# Patient Record
Sex: Male | Born: 1940 | Race: Black or African American | Hispanic: No | Marital: Married | Smoking: Former smoker
Health system: Southern US, Community
[De-identification: ages and names within clinical notes are randomized; demographics above are authoritative.]

## PROBLEM LIST (undated history)

## (undated) DIAGNOSIS — I639 Cerebral infarction, unspecified: Secondary | ICD-10-CM

## (undated) DIAGNOSIS — E041 Nontoxic single thyroid nodule: Secondary | ICD-10-CM

## (undated) DIAGNOSIS — D649 Anemia, unspecified: Secondary | ICD-10-CM

## (undated) DIAGNOSIS — I10 Essential (primary) hypertension: Secondary | ICD-10-CM

## (undated) DIAGNOSIS — K5730 Diverticulosis of large intestine without perforation or abscess without bleeding: Secondary | ICD-10-CM

## (undated) HISTORY — DX: Diverticulosis of large intestine without perforation or abscess without bleeding: K57.30

## (undated) HISTORY — PX: CATARACT EXTRACTION, BILATERAL: SHX1313

## (undated) HISTORY — DX: Cerebral infarction, unspecified: I63.9

## (undated) HISTORY — DX: Essential (primary) hypertension: I10

## (undated) HISTORY — DX: Anemia, unspecified: D64.9

## (undated) HISTORY — PX: BACK SURGERY: SHX140

---

## 1998-04-16 ENCOUNTER — Ambulatory Visit (HOSPITAL_COMMUNITY): Admission: RE | Admit: 1998-04-16 | Discharge: 1998-04-16 | Payer: Self-pay | Admitting: Gastroenterology

## 2000-02-27 ENCOUNTER — Emergency Department (HOSPITAL_COMMUNITY): Admission: EM | Admit: 2000-02-27 | Discharge: 2000-02-27 | Payer: Self-pay | Admitting: Emergency Medicine

## 2000-03-04 ENCOUNTER — Encounter: Payer: Self-pay | Admitting: Pediatrics

## 2000-03-04 ENCOUNTER — Ambulatory Visit (HOSPITAL_COMMUNITY): Admission: RE | Admit: 2000-03-04 | Discharge: 2000-03-04 | Payer: Self-pay | Admitting: Pediatrics

## 2000-03-12 ENCOUNTER — Ambulatory Visit (HOSPITAL_COMMUNITY): Admission: RE | Admit: 2000-03-12 | Discharge: 2000-03-12 | Payer: Self-pay | Admitting: Pediatrics

## 2000-03-18 ENCOUNTER — Ambulatory Visit: Admission: RE | Admit: 2000-03-18 | Discharge: 2000-03-18 | Payer: Self-pay | Admitting: Pediatrics

## 2000-05-25 ENCOUNTER — Encounter (INDEPENDENT_AMBULATORY_CARE_PROVIDER_SITE_OTHER): Payer: Self-pay | Admitting: *Deleted

## 2000-05-25 ENCOUNTER — Ambulatory Visit (HOSPITAL_COMMUNITY): Admission: RE | Admit: 2000-05-25 | Discharge: 2000-05-25 | Payer: Self-pay | Admitting: *Deleted

## 2003-01-04 ENCOUNTER — Encounter: Payer: Self-pay | Admitting: Internal Medicine

## 2003-01-04 LAB — HM COLONOSCOPY

## 2004-08-15 ENCOUNTER — Ambulatory Visit: Payer: Self-pay | Admitting: Internal Medicine

## 2004-11-13 ENCOUNTER — Ambulatory Visit: Payer: Self-pay | Admitting: Internal Medicine

## 2005-02-20 ENCOUNTER — Ambulatory Visit: Payer: Self-pay | Admitting: Internal Medicine

## 2005-05-22 ENCOUNTER — Ambulatory Visit: Payer: Self-pay | Admitting: Internal Medicine

## 2005-08-24 ENCOUNTER — Ambulatory Visit: Payer: Self-pay | Admitting: Internal Medicine

## 2005-08-28 ENCOUNTER — Ambulatory Visit: Payer: Self-pay | Admitting: Internal Medicine

## 2005-08-31 ENCOUNTER — Ambulatory Visit: Payer: Self-pay | Admitting: Internal Medicine

## 2005-09-15 ENCOUNTER — Ambulatory Visit: Payer: Self-pay | Admitting: Internal Medicine

## 2005-10-05 ENCOUNTER — Ambulatory Visit: Payer: Self-pay | Admitting: Internal Medicine

## 2005-10-20 ENCOUNTER — Ambulatory Visit: Payer: Self-pay | Admitting: Internal Medicine

## 2005-12-01 ENCOUNTER — Ambulatory Visit: Payer: Self-pay | Admitting: Internal Medicine

## 2005-12-11 ENCOUNTER — Ambulatory Visit: Payer: Self-pay | Admitting: Internal Medicine

## 2006-02-02 ENCOUNTER — Ambulatory Visit: Payer: Self-pay | Admitting: Internal Medicine

## 2006-02-15 ENCOUNTER — Ambulatory Visit: Payer: Self-pay | Admitting: Internal Medicine

## 2006-04-02 ENCOUNTER — Ambulatory Visit: Payer: Self-pay | Admitting: Internal Medicine

## 2006-07-06 ENCOUNTER — Ambulatory Visit: Payer: Self-pay | Admitting: Internal Medicine

## 2006-07-16 ENCOUNTER — Ambulatory Visit: Payer: Self-pay | Admitting: Internal Medicine

## 2006-07-16 LAB — CONVERTED CEMR LAB
ALT: 18 units/L (ref 0–40)
AST: 22 units/L (ref 0–37)
Albumin: 3.9 g/dL (ref 3.5–5.2)
Alkaline Phosphatase: 132 units/L — ABNORMAL HIGH (ref 39–117)
BUN: 15 mg/dL (ref 6–23)
Bilirubin, Direct: 0.1 mg/dL (ref 0.0–0.3)
CO2: 28 meq/L (ref 19–32)
Calcium: 9.8 mg/dL (ref 8.4–10.5)
Chloride: 103 meq/L (ref 96–112)
Chol/HDL Ratio, serum: 2.5
Cholesterol: 128 mg/dL (ref 0–200)
Creatinine, Ser: 1.1 mg/dL (ref 0.4–1.5)
GFR calc non Af Amer: 71 mL/min
Glomerular Filtration Rate, Af Am: 86 mL/min/{1.73_m2}
Glucose, Bld: 171 mg/dL — ABNORMAL HIGH (ref 70–99)
HDL: 50.4 mg/dL (ref >39.0)
Hgb A1c MFr Bld: 8.8 % — ABNORMAL HIGH (ref 4.6–6.0)
LDL Cholesterol: 71 mg/dL (ref 0–99)
Potassium: 4.6 meq/L (ref 3.5–5.1)
Sodium: 140 meq/L (ref 135–145)
Total Bilirubin: 0.5 mg/dL (ref 0.3–1.2)
Total Protein: 7.4 g/dL (ref 6.0–8.3)
Triglyceride fasting, serum: 31 mg/dL (ref 0–149)
VLDL: 6 mg/dL (ref 0–40)

## 2006-11-12 ENCOUNTER — Ambulatory Visit: Payer: Self-pay | Admitting: Internal Medicine

## 2006-11-12 LAB — CONVERTED CEMR LAB
ALT: 21 units/L (ref 0–40)
AST: 24 units/L (ref 0–37)
Albumin: 3.5 g/dL (ref 3.5–5.2)
Alkaline Phosphatase: 118 units/L — ABNORMAL HIGH (ref 39–117)
BUN: 14 mg/dL (ref 6–23)
Bilirubin, Direct: 0.1 mg/dL (ref 0.0–0.3)
CO2: 30 meq/L (ref 19–32)
Calcium: 8.9 mg/dL (ref 8.4–10.5)
Chloride: 104 meq/L (ref 96–112)
Cholesterol: 113 mg/dL (ref 0–200)
Creatinine, Ser: 1 mg/dL (ref 0.4–1.5)
GFR calc Af Amer: 96 mL/min
GFR calc non Af Amer: 79 mL/min
Glucose, Bld: 179 mg/dL — ABNORMAL HIGH (ref 70–99)
HDL: 50.3 mg/dL (ref >39.0)
Hgb A1c MFr Bld: 8.6 % — ABNORMAL HIGH (ref 4.6–6.0)
LDL Cholesterol: 54 mg/dL (ref 0–99)
Potassium: 4 meq/L (ref 3.5–5.1)
Sodium: 139 meq/L (ref 135–145)
Total Bilirubin: 0.4 mg/dL (ref 0.3–1.2)
Total CHOL/HDL Ratio: 2.2
Total Protein: 7.1 g/dL (ref 6.0–8.3)
Triglycerides: 42 mg/dL (ref 0–149)
VLDL: 8 mg/dL (ref 0–40)

## 2007-02-04 ENCOUNTER — Ambulatory Visit: Payer: Self-pay | Admitting: Internal Medicine

## 2007-02-08 DIAGNOSIS — Z8679 Personal history of other diseases of the circulatory system: Secondary | ICD-10-CM | POA: Insufficient documentation

## 2007-02-08 DIAGNOSIS — D509 Iron deficiency anemia, unspecified: Secondary | ICD-10-CM | POA: Insufficient documentation

## 2007-02-08 DIAGNOSIS — K5730 Diverticulosis of large intestine without perforation or abscess without bleeding: Secondary | ICD-10-CM | POA: Insufficient documentation

## 2007-02-08 DIAGNOSIS — I10 Essential (primary) hypertension: Secondary | ICD-10-CM | POA: Insufficient documentation

## 2007-02-08 DIAGNOSIS — E538 Deficiency of other specified B group vitamins: Secondary | ICD-10-CM | POA: Insufficient documentation

## 2007-02-14 ENCOUNTER — Ambulatory Visit: Payer: Self-pay | Admitting: Internal Medicine

## 2007-03-04 ENCOUNTER — Telehealth (INDEPENDENT_AMBULATORY_CARE_PROVIDER_SITE_OTHER): Payer: Self-pay | Admitting: *Deleted

## 2007-04-01 ENCOUNTER — Ambulatory Visit: Payer: Self-pay | Admitting: Internal Medicine

## 2007-04-01 DIAGNOSIS — E785 Hyperlipidemia, unspecified: Secondary | ICD-10-CM | POA: Insufficient documentation

## 2007-04-01 LAB — CONVERTED CEMR LAB
Cholesterol, target level: 200 mg/dL
HDL goal, serum: 40 mg/dL
LDL Goal: 100 mg/dL

## 2007-04-04 ENCOUNTER — Encounter: Payer: Self-pay | Admitting: Internal Medicine

## 2007-04-04 LAB — CONVERTED CEMR LAB
ALT: 15 units/L (ref 0–53)
AST: 19 units/L (ref 0–37)
Albumin: 3.3 g/dL — ABNORMAL LOW (ref 3.5–5.2)
Alkaline Phosphatase: 115 units/L (ref 39–117)
BUN: 12 mg/dL (ref 6–23)
Bilirubin, Direct: 0.1 mg/dL (ref 0.0–0.3)
CO2: 29 meq/L (ref 19–32)
Calcium: 9.3 mg/dL (ref 8.4–10.5)
Chloride: 105 meq/L (ref 96–112)
Cholesterol: 123 mg/dL (ref 0–200)
Creatinine, Ser: 1.1 mg/dL (ref 0.4–1.5)
GFR calc Af Amer: 86 mL/min
GFR calc non Af Amer: 71 mL/min
Glucose, Bld: 146 mg/dL — ABNORMAL HIGH (ref 70–99)
HDL: 56.2 mg/dL (ref >39.0)
Hgb A1c MFr Bld: 8.4 % — ABNORMAL HIGH (ref 4.6–6.0)
LDL Cholesterol: 60 mg/dL (ref 0–99)
Potassium: 4 meq/L (ref 3.5–5.1)
Sodium: 140 meq/L (ref 135–145)
Total Bilirubin: 0.6 mg/dL (ref 0.3–1.2)
Total CHOL/HDL Ratio: 2.2
Total Protein: 7.3 g/dL (ref 6.0–8.3)
Triglycerides: 33 mg/dL (ref 0–149)
VLDL: 7 mg/dL (ref 0–40)

## 2007-04-08 ENCOUNTER — Ambulatory Visit: Payer: Self-pay | Admitting: Internal Medicine

## 2007-05-06 ENCOUNTER — Ambulatory Visit (HOSPITAL_COMMUNITY): Admission: RE | Admit: 2007-05-06 | Discharge: 2007-05-06 | Payer: Self-pay | Admitting: Orthopedic Surgery

## 2007-05-09 ENCOUNTER — Telehealth: Payer: Self-pay | Admitting: Internal Medicine

## 2007-06-10 ENCOUNTER — Ambulatory Visit: Payer: Self-pay | Admitting: Internal Medicine

## 2007-06-16 ENCOUNTER — Telehealth: Payer: Self-pay | Admitting: Internal Medicine

## 2007-07-01 ENCOUNTER — Ambulatory Visit: Payer: Self-pay | Admitting: Internal Medicine

## 2007-07-01 LAB — CONVERTED CEMR LAB
ALT: 19 units/L (ref 0–53)
AST: 22 units/L (ref 0–37)
Albumin: 3.9 g/dL (ref 3.5–5.2)
Alkaline Phosphatase: 116 units/L (ref 39–117)
BUN: 13 mg/dL (ref 6–23)
Bilirubin, Direct: 0.1 mg/dL (ref 0.0–0.3)
CO2: 30 meq/L (ref 19–32)
Calcium: 9.5 mg/dL (ref 8.4–10.5)
Chloride: 106 meq/L (ref 96–112)
Cholesterol: 139 mg/dL (ref 0–200)
Creatinine, Ser: 1 mg/dL (ref 0.4–1.5)
GFR calc Af Amer: 96 mL/min
GFR calc non Af Amer: 79 mL/min
Glucose, Bld: 135 mg/dL — ABNORMAL HIGH (ref 70–99)
HDL: 61.6 mg/dL (ref >39.0)
Hgb A1c MFr Bld: 7.5 % — ABNORMAL HIGH (ref 4.6–6.0)
LDL Cholesterol: 69 mg/dL (ref 0–99)
LDL Goal: 70 mg/dL
Potassium: 4.2 meq/L (ref 3.5–5.1)
Sodium: 140 meq/L (ref 135–145)
Total Bilirubin: 0.6 mg/dL (ref 0.3–1.2)
Total CHOL/HDL Ratio: 2.3
Total Protein: 7.3 g/dL (ref 6.0–8.3)
Triglycerides: 43 mg/dL (ref 0–149)
VLDL: 9 mg/dL (ref 0–40)

## 2007-07-29 ENCOUNTER — Telehealth (INDEPENDENT_AMBULATORY_CARE_PROVIDER_SITE_OTHER): Payer: Self-pay | Admitting: *Deleted

## 2007-07-29 ENCOUNTER — Ambulatory Visit: Payer: Self-pay | Admitting: Internal Medicine

## 2007-08-11 HISTORY — PX: SHOULDER SURGERY: SHX246

## 2007-09-06 ENCOUNTER — Ambulatory Visit: Payer: Self-pay | Admitting: Internal Medicine

## 2007-09-16 ENCOUNTER — Encounter: Payer: Self-pay | Admitting: Internal Medicine

## 2007-11-04 ENCOUNTER — Ambulatory Visit: Payer: Self-pay | Admitting: Internal Medicine

## 2007-11-07 LAB — CONVERTED CEMR LAB
ALT: 20 units/L (ref 0–53)
AST: 24 units/L (ref 0–37)
GFR calc Af Amer: 96 mL/min
GFR calc non Af Amer: 79 mL/min
Glucose, Bld: 147 mg/dL — ABNORMAL HIGH (ref 70–99)
Hgb A1c MFr Bld: 8 % — ABNORMAL HIGH (ref 4.6–6.0)
LDL Cholesterol: 65 mg/dL (ref 0–99)
Potassium: 4.6 meq/L (ref 3.5–5.1)
Sodium: 140 meq/L (ref 135–145)
Total CHOL/HDL Ratio: 2.2
VLDL: 7 mg/dL (ref 0–40)

## 2007-11-11 ENCOUNTER — Telehealth: Payer: Self-pay | Admitting: Internal Medicine

## 2007-11-14 ENCOUNTER — Encounter: Payer: Self-pay | Admitting: Internal Medicine

## 2007-11-17 ENCOUNTER — Ambulatory Visit: Payer: Self-pay | Admitting: Internal Medicine

## 2008-01-20 ENCOUNTER — Ambulatory Visit: Payer: Self-pay | Admitting: Internal Medicine

## 2008-02-24 ENCOUNTER — Ambulatory Visit: Payer: Self-pay | Admitting: Internal Medicine

## 2008-02-27 LAB — CONVERTED CEMR LAB
ALT: 22 units/L (ref 0–53)
AST: 30 units/L (ref 0–37)
CO2: 28 meq/L (ref 19–32)
Chloride: 104 meq/L (ref 96–112)
Glucose, Bld: 159 mg/dL — ABNORMAL HIGH (ref 70–99)
Hgb A1c MFr Bld: 8.3 % — ABNORMAL HIGH (ref 4.6–6.0)
Potassium: 4.4 meq/L (ref 3.5–5.1)
Sodium: 139 meq/L (ref 135–145)

## 2008-03-23 ENCOUNTER — Ambulatory Visit: Payer: Self-pay | Admitting: Internal Medicine

## 2008-05-17 ENCOUNTER — Ambulatory Visit: Payer: Self-pay | Admitting: Internal Medicine

## 2008-05-17 LAB — HM DIABETES FOOT EXAM

## 2008-05-18 LAB — CONVERTED CEMR LAB
AST: 19 units/L (ref 0–37)
CO2: 28 meq/L (ref 19–32)
Calcium: 9.2 mg/dL (ref 8.4–10.5)
Chloride: 103 meq/L (ref 96–112)
Creatinine, Ser: 1.1 mg/dL (ref 0.4–1.5)
Glucose, Bld: 122 mg/dL — ABNORMAL HIGH (ref 70–99)
HDL: 55.7 mg/dL (ref >39.0)
Sodium: 140 meq/L (ref 135–145)
Triglycerides: 39 mg/dL (ref 0–149)

## 2008-06-29 ENCOUNTER — Ambulatory Visit: Payer: Self-pay | Admitting: Internal Medicine

## 2008-09-17 ENCOUNTER — Ambulatory Visit: Payer: Self-pay | Admitting: Internal Medicine

## 2008-09-17 DIAGNOSIS — Z8614 Personal history of Methicillin resistant Staphylococcus aureus infection: Secondary | ICD-10-CM

## 2008-09-18 ENCOUNTER — Encounter: Payer: Self-pay | Admitting: Internal Medicine

## 2008-09-18 ENCOUNTER — Ambulatory Visit: Payer: Self-pay | Admitting: Internal Medicine

## 2008-09-28 ENCOUNTER — Encounter: Payer: Self-pay | Admitting: Internal Medicine

## 2008-11-16 ENCOUNTER — Ambulatory Visit: Payer: Self-pay | Admitting: Internal Medicine

## 2008-11-16 LAB — CONVERTED CEMR LAB
ALT: 13 units/L (ref 0–53)
Calcium: 9 mg/dL (ref 8.4–10.5)
GFR calc non Af Amer: 95.14 mL/min (ref >60)
Glucose, Bld: 108 mg/dL — ABNORMAL HIGH (ref 70–99)
HDL: 60.7 mg/dL (ref >39.00)
Hgb A1c MFr Bld: 7.7 % — ABNORMAL HIGH (ref 4.6–6.5)
Potassium: 4.7 meq/L (ref 3.5–5.1)
Sodium: 141 meq/L (ref 135–145)
Triglycerides: 48 mg/dL (ref 0.0–149.0)
VLDL: 9.6 mg/dL (ref 0.0–40.0)

## 2008-11-23 ENCOUNTER — Telehealth: Payer: Self-pay | Admitting: Internal Medicine

## 2009-02-22 ENCOUNTER — Ambulatory Visit: Payer: Self-pay | Admitting: Internal Medicine

## 2009-03-18 ENCOUNTER — Ambulatory Visit: Payer: Self-pay | Admitting: Family Medicine

## 2009-05-03 ENCOUNTER — Ambulatory Visit: Payer: Self-pay | Admitting: Family Medicine

## 2009-05-04 ENCOUNTER — Encounter: Payer: Self-pay | Admitting: Internal Medicine

## 2009-06-14 ENCOUNTER — Ambulatory Visit: Payer: Self-pay | Admitting: Internal Medicine

## 2009-06-14 LAB — CONVERTED CEMR LAB
BUN: 16 mg/dL (ref 6–23)
Basophils Absolute: 0 10*3/uL (ref 0.0–0.1)
Calcium: 9.1 mg/dL (ref 8.4–10.5)
Chloride: 104 meq/L (ref 96–112)
Cholesterol: 138 mg/dL (ref 0–200)
Creatinine, Ser: 1.1 mg/dL (ref 0.4–1.5)
Eosinophils Absolute: 0.3 10*3/uL (ref 0.0–0.7)
GFR calc non Af Amer: 85.09 mL/min (ref >60)
HCT: 35.9 % — ABNORMAL LOW (ref 39.0–52.0)
Hemoglobin: 11.9 g/dL — ABNORMAL LOW (ref 13.0–17.0)
Hgb A1c MFr Bld: 8.2 % — ABNORMAL HIGH (ref 4.6–6.5)
LDL Cholesterol: 73 mg/dL (ref 0–99)
Lymphs Abs: 1 10*3/uL (ref 0.7–4.0)
MCHC: 33 g/dL (ref 30.0–36.0)
MCV: 89.5 fL (ref 78.0–100.0)
Monocytes Absolute: 0.7 10*3/uL (ref 0.1–1.0)
Monocytes Relative: 9.6 % (ref 3.0–12.0)
Neutro Abs: 5.5 10*3/uL (ref 1.4–7.7)
Platelets: 248 10*3/uL (ref 150.0–400.0)
RDW: 13 % (ref 11.5–14.6)
Triglycerides: 50 mg/dL (ref 0.0–149.0)

## 2009-08-19 ENCOUNTER — Telehealth: Payer: Self-pay | Admitting: Internal Medicine

## 2009-09-13 ENCOUNTER — Ambulatory Visit: Payer: Self-pay | Admitting: Internal Medicine

## 2009-09-16 LAB — CONVERTED CEMR LAB
CO2: 29 meq/L (ref 19–32)
Calcium: 9.5 mg/dL (ref 8.4–10.5)
Glucose, Bld: 166 mg/dL — ABNORMAL HIGH (ref 70–99)
HDL: 56.8 mg/dL (ref >39.00)
Hgb A1c MFr Bld: 9.4 % — ABNORMAL HIGH (ref 4.6–6.5)
Potassium: 4 meq/L (ref 3.5–5.1)
Sodium: 138 meq/L (ref 135–145)
Total CHOL/HDL Ratio: 2
Triglycerides: 36 mg/dL (ref 0.0–149.0)
VLDL: 7.2 mg/dL (ref 0.0–40.0)

## 2009-09-27 ENCOUNTER — Ambulatory Visit: Payer: Self-pay | Admitting: Internal Medicine

## 2009-10-04 ENCOUNTER — Encounter: Payer: Self-pay | Admitting: Internal Medicine

## 2009-10-04 LAB — HM DIABETES EYE EXAM: HM Diabetic Eye Exam: NORMAL

## 2009-10-10 ENCOUNTER — Encounter: Payer: Self-pay | Admitting: Internal Medicine

## 2009-11-01 ENCOUNTER — Ambulatory Visit: Payer: Self-pay | Admitting: Internal Medicine

## 2010-01-03 ENCOUNTER — Ambulatory Visit: Payer: Self-pay | Admitting: Internal Medicine

## 2010-01-07 LAB — CONVERTED CEMR LAB
AST: 21 units/L (ref 0–37)
Alkaline Phosphatase: 122 units/L — ABNORMAL HIGH (ref 39–117)
BUN: 25 mg/dL — ABNORMAL HIGH (ref 6–23)
Basophils Relative: 0.3 % (ref 0.0–3.0)
Bilirubin, Direct: 0.1 mg/dL (ref 0.0–0.3)
CO2: 30 meq/L (ref 19–32)
Calcium: 9.5 mg/dL (ref 8.4–10.5)
Chloride: 107 meq/L (ref 96–112)
Creatinine, Ser: 1.2 mg/dL (ref 0.4–1.5)
Eosinophils Absolute: 0.5 10*3/uL (ref 0.0–0.7)
HCT: 35.8 % — ABNORMAL LOW (ref 39.0–52.0)
Hemoglobin: 11.7 g/dL — ABNORMAL LOW (ref 13.0–17.0)
LDL Cholesterol: 74 mg/dL (ref 0–99)
Lymphs Abs: 0.8 10*3/uL (ref 0.7–4.0)
MCHC: 32.7 g/dL (ref 30.0–36.0)
MCV: 86.7 fL (ref 78.0–100.0)
Monocytes Absolute: 0.6 10*3/uL (ref 0.1–1.0)
Neutro Abs: 4 10*3/uL (ref 1.4–7.7)
RBC: 4.13 M/uL — ABNORMAL LOW (ref 4.22–5.81)
Total CHOL/HDL Ratio: 2

## 2010-05-02 ENCOUNTER — Ambulatory Visit: Payer: Self-pay | Admitting: Internal Medicine

## 2010-05-05 LAB — CONVERTED CEMR LAB
AST: 20 units/L (ref 0–37)
Alkaline Phosphatase: 116 units/L (ref 39–117)
Basophils Relative: 0.4 % (ref 0.0–3.0)
Bilirubin, Direct: 0.1 mg/dL (ref 0.0–0.3)
CO2: 29 meq/L (ref 19–32)
Chloride: 103 meq/L (ref 96–112)
Creatinine, Ser: 1.2 mg/dL (ref 0.4–1.5)
Eosinophils Absolute: 0.9 10*3/uL — ABNORMAL HIGH (ref 0.0–0.7)
HDL: 54.6 mg/dL (ref >39.00)
MCHC: 33.2 g/dL (ref 30.0–36.0)
MCV: 87.9 fL (ref 78.0–100.0)
Monocytes Absolute: 0.6 10*3/uL (ref 0.1–1.0)
Neutro Abs: 3.6 10*3/uL (ref 1.4–7.7)
Neutrophils Relative %: 61 % (ref 43.0–77.0)
RBC: 3.9 M/uL — ABNORMAL LOW (ref 4.22–5.81)
TSH: 2.02 microintl units/mL (ref 0.35–5.50)
Total Bilirubin: 0.4 mg/dL (ref 0.3–1.2)
Total CHOL/HDL Ratio: 3

## 2010-06-10 ENCOUNTER — Telehealth: Payer: Self-pay | Admitting: Internal Medicine

## 2010-07-25 ENCOUNTER — Ambulatory Visit: Payer: Self-pay | Admitting: Internal Medicine

## 2010-09-11 NOTE — Progress Notes (Signed)
Summary: REFILL  Phone Note From Pharmacy   Caller: [facility] Call For: Scott Vasquez  Summary of Call: LISINOPRIL 20MG  1 TAB BY MOUTH EVERY DAY #90 LAST FILLED 02-03-07    see rx refill

## 2010-09-11 NOTE — Progress Notes (Signed)
Summary: REFILL REQUEST  Phone Note Refill Request Message from:  Patient   [phone] on June 10, 2010 2:24 PM  Refills Requested: Medication #1:  LISINOPRIL 40 MG TABS 1/2 by mouth once daily   Notes: [facility]    Initial call taken by: Debbra Riding,  June 10, 2010 2:24 PM  Follow-up for Phone Call        pt says that directions have changed to 1 by mouth once daily.  I don't see that documented anywhere and pharmacy wants to confirm Follow-up by: Alfred Levins, CMA,  June 10, 2010 2:33 PM  Additional Follow-up for Phone Call Additional follow up Details #1::        please return call. Additional Follow-up by: Warnell Forester,  June 10, 2010 3:49 PM    Additional Follow-up for Phone Call Additional follow up Details #2::    pt aware Follow-up by: Alfred Levins, CMA,  June 10, 2010 4:53 PM  New/Updated Medications: LISINOPRIL 40 MG TABS (LISINOPRIL) Take 1 tablet by mouth once a day Prescriptions: LISINOPRIL 40 MG TABS (LISINOPRIL) Take 1 tablet by mouth once a day  #90 x 3   Entered and Authorized by:   Birdie Sons MD   Signed by:   Birdie Sons MD on 06/10/2010   Method used:   Electronically to        [facility] [address] (retail)       [address]       [place], [place]  [address]       Ph: [phone] or [phone]       Fax: [phone]   RxID:   [id number]

## 2010-09-11 NOTE — Assessment & Plan Note (Signed)
Summary: 1 MONTH ROV/NJR   Vital Signs:  Patient profile:   70 year old male Weight:      213 pounds Temp:     98.3 degrees F oral BP sitting:   152 / 82  (left arm) Cuff size:   regular  Vitals Entered By: Kern Reap CMA Duncan Dull) (November 01, 2009 9:30 AM)  CC: follow-up visit, b12  Is Patient Diabetic? Yes Did you bring your meter with you today? No   CC:  follow-up visit and b12 .  History of Present Illness:  Follow-Up Visit      This is a 70 year old man who presents for Follow-up visit.  The patient denies chest pain and palpitations.  Since the last visit the patient notes no new problems or concerns.  The patient reports taking meds as prescribed and monitoring blood sugars.  When questioned about possible medication side effects, the patient notes none.  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 Inject 1 Cc Monthly 2)  Lisinopril 40 Mg Tabs (Lisinopril) .Marland Kitchen.. 1 By Mouth Once Daily 3)  Adult Aspirin Low Strength 81 Mg  Tbdp (Aspirin) .... One By Mouth Daily 4)  Lipitor 40 Mg  Tabs (Atorvastatin Calcium) .... One By Mouth Daily 5)  Actoplus Met 15-850 Mg  Tabs (Pioglitazone Hcl-Metformin Hcl) .... Two Times A Day 6)  Januvia 100 Mg  Tabs (Sitagliptin Phosphate) .... Once Daily 7)  Meclizine Hcl 12.5 Mg Tabs (Meclizine Hcl) .... One By Mouth Q 6 Hours As Needed Vertigo 8)  Relion 70/30 70-30 % Susp (Insulin Isophane & Regular) .Marland Kitchen.. 10 Units Subcutaneously Before Breakfast and Dinner  Insulin Syringes #100/ 9)  Insulin Syringe 31g X 5/16" 1 Ml Misc (Insulin Syringe-Needle U-100) .... Use Two Times A Day As Directed  Allergies (verified): No Known Drug Allergies  Past History:  Past Medical History: Last updated: 09/17/2008 Anemia-iron deficiency Diabetes mellitus, type I Diverticulosis, colon Hypertension MRSA Cerebrovascular accident, hx of    Past Surgical History: Last updated: 09/18/2008 Cataract extraction-Bilat Back sx Colonoscopy-01/03/2006 Perforated bowel shoulder surgery january 2009  Supple   Family History: Last updated: 02/08/2007 Family History Diabetes 1st degree relative  Social History: Last updated: 09/17/2008 Former Smoker Retired Married Alcohol use-no 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   Head:  normocephalic and atraumatic.   Eyes:  pupils equal and pupils round.   Neck:  no mass and no carotid bruit Lungs:  normal respiratory effort and no intercostal retractions.   Heart:  normal rate and regular rhythm.   Abdomen:  soft and non-tender.   Msk:  No deformity or scoliosis noted of thoracic or lumbar spine.   Neurologic:  cranial nerves II-XII intact and gait normal.     Impression & Recommendations:  Problem # 1:  B12 DEFICIENCY (ICD-266.2)  Orders: Vit B12 1000 mcg (J3420) Admin of Therapeutic Inj  intramuscular or subcutaneous ([address])  Problem # 2:  HYPERLIPIDEMIA NEC/NOS (ICD-272.4) controlled continue current medications  His updated medication list for this problem includes:    Lipitor 40 Mg Tabs (Atorvastatin calcium) ..... One by mouth daily  Labs Reviewed: SGOT: 19 (05/17/2008)   SGPT: 17 (09/13/2009)  Lipid Goals: Chol Goal: 200 (04/01/2007)   HDL Goal: 40  (04/01/2007)   LDL Goal: 70 (07/01/2007)   TG Goal: 150 (04/01/2007)  Prior 10 Yr Risk Heart Disease: 22 % (04/01/2007)   HDL:56.80 (09/13/2009), 54.90 (06/14/2009)  LDL:60 (09/13/2009), 73 (06/14/2009)  Chol:124 (09/13/2009), 138 (06/14/2009)  Trig:36.0 (09/13/2009), 50.0 (06/14/2009)  Problem # 3:  DM W/MANIFESTATONS NEC, TYPE II (ICD-250.80) CBGs much better will continue meds tolerating insulin see me two months with labs His updated medication list for this problem includes:    Lisinopril 40 Mg Tabs (Lisinopril) .Marland Kitchen... 1 by mouth once daily    Adult Aspirin Low Strength 81 Mg Tbdp (Aspirin) ..... One by mouth daily    Actoplus Met 15-850 Mg Tabs (Pioglitazone hcl-metformin hcl) .Marland Kitchen..Marland Kitchen Two times a day    Januvia 100 Mg Tabs (Sitagliptin phosphate) ..... Once daily    Relion 70/30 70-30 % Susp (Insulin isophane & regular) .Marland KitchenMarland KitchenMarland KitchenMarland Kitchen 10 units subcutaneously before breakfast and dinner insulin syringes #100/  Labs Reviewed: Creat: 1.1 (09/13/2009)     Last Eye Exam: normal (10/04/2009) Reviewed HgBA1c results: 9.4 (09/13/2009)  8.2 (06/14/2009)  Problem # 4:  CEREBROVASCULAR ACCIDENT, HX OF (ICD-V12.50) no recurrence  Complete Medication List: 1)  Cyanocobalamin 1000 Mcg/ml Inj Soln (Cyanocobalamin) .... Inject 1 cc monthly 2)  Lisinopril 40 Mg Tabs (Lisinopril) .Marland Kitchen.. 1 by mouth once daily 3)  Adult Aspirin Low Strength 81 Mg Tbdp (Aspirin) .... One by mouth daily 4)  Lipitor 40 Mg Tabs (Atorvastatin calcium) .... One by mouth daily 5)  Actoplus Met 15-850 Mg Tabs (Pioglitazone hcl-metformin hcl) .... Two times a day 6)  Januvia 100 Mg Tabs (Sitagliptin phosphate) .... Once daily 7)  Meclizine Hcl 12.5 Mg Tabs (Meclizine hcl) .... One by mouth q 6 hours as needed vertigo 8)  Relion 70/30 70-30 % Susp (Insulin isophane & regular) .Marland Kitchen.. 10 units subcutaneously before breakfast and dinner insulin syringes #100/ 9)  Insulin Syringe 31g X 5/16" 1 Ml Misc (Insulin syringe-needle u-100) .... Use  two times a day as directed   Medication Administration  Injection # 1:    Medication: Vit B12 1000 mcg    Diagnosis: B12 DEFICIENCY (ICD-266.2)    Route: IM    Site: L deltoid    Exp Date: 04/11/2011    Lot #: [id number]    Mfr: American Regent    Patient tolerated injection without complications    Given by: Kern Reap CMA Duncan Dull) (November 01, 2009 9:43 AM)  Orders Added: 1)  Vit B12 1000 mcg [J3420] 2)  Admin of Therapeutic Inj  intramuscular or subcutaneous [96372] 3)  Est. Patient Level IV [40981]

## 2010-09-11 NOTE — Letter (Signed)
Summary: [facility]  [facility]   Imported By: Maryln Gottron 10/21/2009 14:36:26  _____________________________________________________________________  External Attachment:    Type:   Image     Comment:   External Document

## 2010-09-11 NOTE — Assessment & Plan Note (Signed)
Summary: 2 month follow up/pt coming in fastin/a1c/bmet/lfts/lipids/25...   Vital Signs:  Patient profile:   70 year old male Weight:      203 pounds BMI:     30.09 Temp:     98 degrees F oral Pulse rate:   68 / minute Pulse rhythm:   regular Resp:     12 per minute BP sitting:   112 / 64  (left arm) Cuff size:   regular  Vitals Entered By: Gladis Riffle, RN (Jan 03, 2010 8:25 AM) CC: 2 month rov, fasting--CBGs 98-140 at home Is Patient Diabetic? Yes Did you bring your meter with you today? No   CC:  2 month rov and fasting--CBGs 98-140 at home.  History of Present Illness:  Follow-Up Visit      This is a 70 year old man who presents for Follow-up visit.  The patient denies chest pain and palpitations.  Since the last visit the patient notes no new problems or concerns.  The patient reports taking meds as prescribed.  When questioned about possible medication side effects, the patient notes none.    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 Inject 1 Cc Monthly 2)  Lisinopril 40 Mg Tabs (Lisinopril) .Marland Kitchen.. 1 By Mouth Once Daily 3)  Adult Aspirin Low Strength 81 Mg  Tbdp (Aspirin) .... One By Mouth Daily 4)  Lipitor 40 Mg  Tabs (Atorvastatin Calcium) .... One By Mouth Daily 5)  Actoplus Met 15-850 Mg  Tabs (Pioglitazone Hcl-Metformin Hcl) .... Two Times A Day 6)   Januvia 100 Mg  Tabs (Sitagliptin Phosphate) .... Once Daily 7)  Meclizine Hcl 12.5 Mg Tabs (Meclizine Hcl) .... One By Mouth Q 6 Hours As Needed Vertigo 8)  Relion 70/30 70-30 % Susp (Insulin Isophane & Regular) .Marland Kitchen.. 10 Units Subcutaneously Before Breakfast and Dinner Insulin Syringes #100/ 9)  Insulin Syringe 31g X 5/16" 1 Ml Misc (Insulin Syringe-Needle U-100) .... Use Two Times A Day As Directed  Allergies (verified): No Known Drug Allergies  Past History:  Past Medical History: Last updated: 09/17/2008 Anemia-iron deficiency Diabetes mellitus, type I Diverticulosis, colon Hypertension MRSA Cerebrovascular accident, hx of    Past Surgical History: Last updated: 09/18/2008 Cataract extraction-Bilat Back sx Colonoscopy-01/03/2006 Perforated bowel shoulder surgery january 2009  Supple   Family History: Last updated: 02/08/2007 Family History Diabetes 1st degree relative  Social History: Last updated: 09/17/2008 Former Smoker Retired Married Alcohol use-no 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   Head:  normocephalic and atraumatic.   Eyes:  pupils equal and pupils round.   Ears:  R ear normal and L ear normal.   Neck:  no mass and no carotid bruit Chest Wall:  no deformities.  Lungs:  normal respiratory effort and no intercostal retractions.   Heart:  normal rate and regular rhythm.   Abdomen:  soft and non-tender.   Msk:  No deformity or scoliosis noted of thoracic or lumbar spine.   Neurologic:  cranial nerves II-XII intact and gait normal.     Impression & Recommendations:  Problem # 1:  HYPERTENSION (ICD-401.9)  controlled continue current medications  may be able to decrease meds with weight loss His updated medication list for this problem includes:    Lisinopril 40 Mg Tabs (Lisinopril)  .Marland Kitchen... 1/2 by mouth once daily  BP today: 112/64 Prior BP: 152/82 (11/01/2009)  Prior 10 Yr Risk Heart Disease: 22 % (04/01/2007)  Labs Reviewed: K+: 4.0 (09/13/2009) Creat: : 1.1 (09/13/2009)   Chol: 124 (09/13/2009)   HDL: 56.80 (09/13/2009)   LDL: 60 (09/13/2009)   TG: 36.0 (09/13/2009)  Orders: Venipuncture (04540) TLB-BMP (Basic Metabolic Panel-BMET) (80048-METABOL)  Problem # 2:  HYPERLIPIDEMIA NEC/NOS (ICD-272.4)  will decreased dose to 1/2 daily His updated medication list for this problem includes:    Lipitor 40 Mg Tabs (Atorvastatin calcium) .Marland Kitchen... 1/2  by mouth daily  Labs Reviewed: SGOT: 19 (05/17/2008)   SGPT: 17 (09/13/2009)  Lipid Goals: Chol Goal: 200 (04/01/2007)   HDL Goal: 40 (04/01/2007)   LDL Goal: 70 (07/01/2007)   TG Goal: 150 (04/01/2007)  Prior 10 Yr Risk Heart Disease: 22 % (04/01/2007)   HDL:56.80 (09/13/2009), 54.90 (06/14/2009)  LDL:60 (09/13/2009), 73 (06/14/2009)  Chol:124 (09/13/2009), 138 (06/14/2009)  Trig:36.0 (09/13/2009), 50.0 (06/14/2009)  Orders: TLB-Lipid Panel (80061-LIPID) TLB-Hepatic/Liver Function Pnl (80076-HEPATIC) TLB-TSH (Thyroid Stimulating Hormone) (84443-TSH)  Problem # 3:  DM W/MANIFESTATONS NEC, TYPE II (ICD-250.80) will check labs today I am hopeful that with wieght loss we can decrease meds.  His updated medication list for this problem includes:    Lisinopril 40 Mg Tabs (Lisinopril) .Marland Kitchen... 1/2 by mouth once daily    Adult Aspirin Low Strength 81 Mg Tbdp (Aspirin) ..... One by mouth daily    Actoplus Met 15-850 Mg Tabs (Pioglitazone hcl-metformin hcl) .Marland Kitchen..Marland Kitchen Two times a day    Januvia 100 Mg Tabs (Sitagliptin phosphate) ..... Once daily    Relion 70/30 70-30 % Susp (Insulin isophane & regular) .Marland KitchenMarland KitchenMarland KitchenMarland Kitchen 10 units subcutaneously before breakfast and dinner insulin syringes #100/  Labs Reviewed: Creat: 1.1 (09/13/2009)     Last Eye Exam: normal (10/04/2009) Reviewed HgBA1c results: 9.4 (09/13/2009)  8.2  (06/14/2009)  Problem # 4:  B12 DEFICIENCY (ICD-266.2)  Orders: Vit B12 1000 mcg (J3420) Admin of Therapeutic Inj  intramuscular or subcutaneous (98119)  Complete Medication List: 1)  Cyanocobalamin 1000 Mcg/ml Inj Soln (Cyanocobalamin) .... Inject 1 cc monthly 2)  Lisinopril 40 Mg Tabs (Lisinopril) .... 1/2 by mouth once daily 3)  Adult Aspirin Low Strength 81 Mg Tbdp (Aspirin) .... One by mouth daily 4)  Lipitor 40 Mg Tabs (Atorvastatin calcium) .... 1/2  by mouth daily 5)  Actoplus Met 15-850 Mg Tabs (Pioglitazone hcl-metformin hcl) .... Two times a day 6)  Januvia 100 Mg Tabs (Sitagliptin phosphate) .... Once daily 7)  Meclizine Hcl 12.5 Mg Tabs (Meclizine hcl) .... One by mouth q 6 hours as needed vertigo 8)  Relion 70/30 70-30 % Susp (Insulin isophane & regular) .Marland Kitchen.. 10 units subcutaneously before breakfast and dinner insulin syringes #100/ 9)  Insulin Syringe 31g X 5/16" 1 Ml Misc (Insulin syringe-needle u-100) .... Use two times a day as directed  Other Orders: TLB-CBC Platelet - w/Differential (85025-CBCD)  Patient Instructions:  1)  Please schedule a follow-up appointment in 4 months.   Immunization History:  Tetanus/Td Immunization History:    Tetanus/Td:  historical (08/10/2004)    Medication Administration  Injection # 1:    Medication: Vit B12 1000 mcg    Diagnosis: B12 DEFICIENCY (ICD-266.2)    Route: IM    Site: L deltoid    Exp Date: 04/11/2011    Lot #: [id number]    Mfr: American Regent    Patient tolerated injection without complications    Given by: Gladis Riffle, RN (Jan 03, 2010 8:33 AM)  Orders Added: 1)  Vit B12 1000 mcg [J3420] 2)  Admin of Therapeutic Inj  intramuscular or subcutaneous [96372] 3)  Est. Patient Level IV [82956] 4)  Venipuncture [36415] 5)  TLB-BMP (Basic Metabolic Panel-BMET) [80048-METABOL] 6)  TLB-Lipid Panel [80061-LIPID] 7)  TLB-Hepatic/Liver Function Pnl [80076-HEPATIC] 8)  TLB-TSH (Thyroid Stimulating Hormone)  [84443-TSH] 9)  TLB-CBC Platelet - w/Differential [85025-CBCD]

## 2010-09-11 NOTE — Assessment & Plan Note (Signed)
Summary: 4 month rov/njr   Vital Signs:  Patient profile:   70 year old male Weight:      208 pounds Temp:     98.3 degrees F oral Pulse rate:   78 / minute BP sitting:   160 / 80  (left arm) Cuff size:   regular  Vitals Entered By: Romualdo Bolk, CMA (AAMA) (May 02, 2010 8:59 AM) CC: follow-up visit   CC:  follow-up visit.  History of Present Illness:  Follow-Up Visit      This is a 70 year old man who presents for Follow-up visit.  The patient denies chest pain and palpitations.  Since the last visit the patient notes no new problems or concerns.  The patient reports taking meds as prescribed, monitoring BP, and monitoring blood sugars.  When questioned about possible medication side effects, the patient notes none.   BPs 140s/80s CBGs in low 100s  All other systems reviewed and were negative   Allergies: No Known Drug Allergies  Physical Exam  General:  alert and well-developed.   Head:  normocephalic and atraumatic.   Eyes:  pupils equal and pupils round.   Ears:  R ear normal and L ear normal.   Neck:  no mass and no carotid bruit Chest Wall:  no deformities.   Lungs:  normal respiratory effort and no intercostal retractions.   Heart:  normal rate and regular rhythm.   Abdomen:  soft and non-tender.   Skin:  turgor normal and color normal.   Psych:  normally interactive and good eye contact.     Impression & Recommendations:  Problem # 1:  HYPERTENSION (ICD-401.9)  not as well controlled His updated medication list for this problem includes:    Lisinopril 40 Mg Tabs (Lisinopril) .Marland Kitchen... 1/2 by mouth once daily  BP today: 160/80 Prior BP: 112/64 (01/03/2010)  Prior 10 Yr Risk Heart Disease: 22 % (04/01/2007)  Labs Reviewed: K+: 5.8 Repeated and verified X2. mEq/L (01/03/2010) Creat: : 1.2 (01/03/2010)   Chol: 143 (01/03/2010)   HDL: 61.70 (01/03/2010)   LDL: 74 (01/03/2010)   TG: 36.0 (01/03/2010)  Orders: TLB-BMP (Basic Metabolic  Panel-BMET) (80048-METABOL)  Problem # 2:  DM W/MANIFESTATONS NEC, TYPE II (ICD-250.80)  needs labs His updated medication list for this problem includes:    Lisinopril 40 Mg Tabs (Lisinopril) .Marland Kitchen... 1/2 by mouth once daily    Adult Aspirin Low Strength 81 Mg Tbdp (Aspirin) ..... One by mouth daily    Actoplus Met 15-850 Mg Tabs (Pioglitazone hcl-metformin hcl) .Marland Kitchen..Marland Kitchen Two times a day    Januvia 100 Mg Tabs (Sitagliptin phosphate) ..... Once daily    Relion 70/30 70-30 % Susp (Insulin isophane & regular) .Marland KitchenMarland KitchenMarland KitchenMarland Kitchen 10 units subcutaneously before breakfast and dinner insulin syringes #100/  Labs Reviewed: Creat: 1.2 (01/03/2010)     Last Eye Exam: normal (10/04/2009) Reviewed HgBA1c results: 9.4 (09/13/2009)  8.2 (06/14/2009)  Orders: TLB-A1C / Hgb A1C (Glycohemoglobin) (83036-A1C)  Problem # 3:  B12 DEFICIENCY (ICD-266.2) monthly injection Orders: Vit B12 1000 mcg (J3420) Admin of Therapeutic Inj  intramuscular or subcutaneous (04540)  Problem # 4:  HYPERLIPIDEMIA NEC/NOS (ICD-272.4)  His updated medication list for this problem includes:    Lipitor 40 Mg Tabs (Atorvastatin calcium) .Marland Kitchen... 1/2  by mouth daily  Labs Reviewed: SGOT: 21 (01/03/2010)   SGPT: 16 (01/03/2010)  Lipid Goals: Chol Goal: 200 (04/01/2007)   HDL Goal: 40 (04/01/2007)   LDL Goal: 70 (07/01/2007)   TG Goal: 150 (04/01/2007)  Prior 10 Yr Risk Heart Disease: 22 % (04/01/2007)   HDL:61.70 (01/03/2010), 56.80 (09/13/2009)  LDL:74 (01/03/2010), 60 (09/13/2009)  Chol:143 (01/03/2010), 124 (09/13/2009)  Trig:36.0 (01/03/2010), 36.0 (09/13/2009)  Orders: Venipuncture (51884) TLB-Lipid Panel (80061-LIPID) TLB-Hepatic/Liver Function Pnl (80076-HEPATIC) TLB-TSH (Thyroid Stimulating Hormone) (84443-TSH)  Complete Medication List: 1)  Cyanocobalamin 1000 Mcg/ml Inj Soln (Cyanocobalamin) .... Inject 1 cc monthly 2)  Lisinopril 40 Mg Tabs (Lisinopril) .... 1/2 by mouth once daily 3)  Adult Aspirin Low Strength 81 Mg  Tbdp (Aspirin) .... One by mouth daily 4)  Lipitor 40 Mg Tabs (Atorvastatin calcium) .... 1/2  by mouth daily 5)  Actoplus Met 15-850 Mg Tabs (Pioglitazone hcl-metformin hcl) .... Two times a day 6)  Januvia 100 Mg Tabs (Sitagliptin phosphate) .... Once daily 7)  Meclizine Hcl 12.5 Mg Tabs (Meclizine hcl) .... One by mouth q 6 hours as needed vertigo 8)  Relion 70/30 70-30 % Susp (Insulin isophane & regular) .Marland Kitchen.. 10 units subcutaneously before breakfast and dinner insulin syringes #100/ 9)  Insulin Syringe 31g X 5/16" 1 Ml Misc (Insulin syringe-needle u-100) .... Use two times a day as directed  Other Orders: Admin 1st Vaccine (16606) Flu Vaccine 46yrs + (30160) TLB-CBC Platelet - w/Differential (85025-CBCD)   Patient Instructions: 1)  Please schedule a follow-up appointment in 3 months.   Flu Vaccine Consent Questions     Do you have a history of severe allergic reactions to this vaccine? no    Any prior history of allergic reactions to egg and/or gelatin? no    Do you have a sensitivity to the preservative Thimersol? no    Do you have a past history of Guillan-Barre Syndrome? no    Do you currently have an acute febrile illness? no    Have you ever had a severe reaction to latex? no    Vaccine information given and explained to patient? yes    Are you currently pregnant? no    Lot Number:[id number]   Exp Date:02/07/2011   Site Given  Left Deltoid IMu Romualdo Bolk, CMA (AAMA)  May 02, 2010 9:00 AM   Medication Administration  Injection # 1:    Medication: Vit B12 1000 mcg    Diagnosis: B12 DEFICIENCY (ICD-266.2)    Route: IM    Site: R deltoid    Exp Date: 02/08/2012    Lot #: [id number]    Mfr: American Regent    Patient tolerated injection without complications    Given by: Romualdo Bolk, CMA Duncan Dull) (May 02, 2010 9:01 AM)  Orders Added: 1)  Admin 1st Vaccine [90471] 2)  Flu Vaccine 110yrs + [10932] 3)  Vit B12 1000 mcg [J3420] 4)  Admin of Therapeutic  Inj  intramuscular or subcutaneous [96372] 5)  Venipuncture [36415] 6)  TLB-Lipid Panel [80061-LIPID] 7)  TLB-Hepatic/Liver Function Pnl [80076-HEPATIC] 8)  TLB-TSH (Thyroid Stimulating Hormone) [84443-TSH] 9)  TLB-BMP (Basic Metabolic Panel-BMET) [80048-METABOL] 10)  TLB-CBC Platelet - w/Differential [85025-CBCD] 11)  TLB-A1C / Hgb A1C (Glycohemoglobin) [83036-A1C]   Appended Document: Orders Update    Clinical Lists Changes  Orders: Added new Service order of Specimen Handling (35573) - Signed

## 2010-09-11 NOTE — Progress Notes (Signed)
Summary: refill actoplus-met  Phone Note Refill Request Message from:  Fax from Pharmacy  Refills Requested: Medication #1:  ACTOPLUS MET 15-850 MG  TABS two times a day   Last Refilled: 07/17/2009 [facility] ph----[phone]   fax---[phone]  Initial call taken by: Warnell Forester,  August 19, 2009 11:04 AM    Prescriptions: ACTOPLUS MET 15-850 MG  TABS (PIOGLITAZONE HCL-METFORMIN HCL) two times a day  #60 Each x 5   Entered by:   Gladis Riffle, RN   Authorized by:   Birdie Sons MD   Signed by:   Gladis Riffle, RN on 08/19/2009   Method used:   Electronically to        [facility] [address] (retail)       [address]       [place], [place]  [address]       Ph: [phone] or [phone]       Fax: [phone]   RxID:   [id number]

## 2010-09-11 NOTE — Assessment & Plan Note (Signed)
Summary: 23 month rov/njr   Vital Signs:  Patient profile:   70 year old male Weight:      214 pounds Temp:     98.1 degrees F oral Pulse rate:   88 / minute BP sitting:   132 / 60  Vitals Entered By: Alfred Levins, CMA (July 25, 2010 9:54 AM) CC: rov Is Patient Diabetic? Yes Pain Assessment Patient in pain? no        CC:  rov.  History of Present Illness:  Follow-Up Visit      This is a 70 year old man who presents for Follow-up visit.  The patient denies chest pain and palpitations.  Since the last visit the patient notes no new problems or concerns.  The patient reports taking meds as prescribed.  When questioned about possible medication side effects, the patient notes none.    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 Inject 1 Cc Monthly 2)  Lisinopril 40 Mg Tabs (Lisinopril) .... Take 1 Tablet By Mouth Once A Day 3)  Adult Aspirin Low Strength 81 Mg  Tbdp (Aspirin) .... One By Mouth Daily 4)  Lipitor 40 Mg  Tabs (Atorvastatin Calcium) .Marland Kitchen.. 1 By Mouth Once Daily or As Directed 5)  Actoplus Met 15-850 Mg  Tabs (Pioglitazone Hcl-Metformin Hcl) .... Two Times A Day 6)  Januvia 100 Mg  Tabs (Sitagliptin Phosphate) .... Once Daily 7)  Meclizine Hcl 12.5 Mg Tabs (Meclizine Hcl) .... One By Mouth Q 6 Hours As Needed Vertigo 8)  Relion 70/30 70-30 % Susp (Insulin Isophane & Regular) .Marland Kitchen.. 10 Units Subcutaneously Before Breakfast and Dinner Insulin Syringes #100/ 9)  Insulin Syringe 31g X 5/16" 1 Ml  Misc (Insulin Syringe-Needle U-100) .... Use Two Times A Day As Directed  Allergies (verified): No Known Drug Allergies  Past History:  Past Medical History: Last updated: 09/17/2008 Anemia-iron deficiency Diabetes mellitus, type I Diverticulosis, colon Hypertension MRSA Cerebrovascular accident, hx of    Past Surgical History: Last updated: 09/18/2008 Cataract extraction-Bilat Back sx Colonoscopy-01/03/2006 Perforated bowel shoulder surgery january 2009  Supple   Family History: Last updated: 02/08/2007 Family History Diabetes 1st degree relative  Social History: Last updated: 09/17/2008 Former Smoker Retired Married Alcohol use-no 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 Overweight. HEENT exam atraumatic normocephalic, neck supple. Chest clear to auscultation cardiac exam S1-S2 are regular. Abdominal exam overweight, to bowel sounds, soft her extremities no clubbing cyanosis or edema.   Impression & Recommendations:  Problem # 1:  HYPERTENSION (ICD-401.9)  His updated medication list for this problem includes:    Lisinopril 40 Mg Tabs (Lisinopril) .Marland Kitchen... Take 1 tablet by mouth once a day  Problem # 2:  DM W/MANIFESTATONS NEC, TYPE II (ICD-250.80) had one episode of low blood sugar A1C MUCH better last visit.  The following medications were removed from the medication list:    Januvia 100  Mg Tabs (Sitagliptin phosphate) ..... Once daily His updated medication list for this problem includes:    Lisinopril 40 Mg Tabs (Lisinopril) .Marland Kitchen... Take 1 tablet by mouth once a day    Adult Aspirin Low Strength 81 Mg Tbdp (Aspirin) ..... One by mouth daily    Janumet 50-1000 Mg Tabs (Sitagliptin-metformin hcl) .Marland Kitchen... Take 1 tablet by mouth once a day    Relion 70/30 70-30 % Susp (Insulin  isophane & regular) .Marland KitchenMarland KitchenMarland KitchenMarland Kitchen 10 units subcutaneously before breakfast and dinner insulin syringes #100/  Labs Reviewed: Creat: 1.2 (05/02/2010)     Last Eye Exam: normal (10/04/2009) Reviewed HgBA1c results: 7.1 (05/02/2010)  9.4 (09/13/2009)  Complete Medication List: 1)  Cyanocobalamin 1000 Mcg/ml Inj Soln (Cyanocobalamin) .... Inject 1 cc monthly 2)  Lisinopril 40 Mg Tabs (Lisinopril) .... Take 1 tablet by mouth once a day 3)  Adult Aspirin Low Strength 81 Mg Tbdp (Aspirin) .... One by mouth daily 4)  Lipitor 40 Mg Tabs (Atorvastatin calcium) .Marland Kitchen.. 1 by mouth once daily or as directed 5)  Janumet 50-1000 Mg Tabs (Sitagliptin-metformin hcl) .... Take 1 tablet by mouth once a day 6)  Meclizine Hcl 12.5 Mg Tabs (Meclizine hcl) .... One by mouth q 6 hours as needed vertigo 7)  Relion 70/30 70-30 % Susp (Insulin isophane & regular) .Marland Kitchen.. 10 units subcutaneously before breakfast and dinner insulin syringes #100/ 8)  Insulin Syringe 31g X 5/16" 1 Ml Misc (Insulin syringe-needle u-100) .... Use two times a day as directed  Patient Instructions: 1)  Please schedule a follow-up appointment in 2 months. 2)  labs one week prior to visit 3)  lipids---272.4 4)  lfts-995.2 5)  bmet-995.2 6)  A1C-250.02 7)     Prescriptions: METFORMIN HCL 500 MG TABS (METFORMIN HCL) Take 1 tab twice daily  #180 x 3   Entered and Authorized by:   Birdie Sons MD   Signed by:   Birdie Sons MD on 07/25/2010   Method used:   Faxed to ...       [facility] [address] (retail)       [address]       [place], [place]  [address]       Ph: [phone] or [phone]       Fax: [phone]   RxID:   [id number]    Orders Added: 1)  Est. Patient Level IV [27253]  Appended Document: 23 month rov/njr    Clinical Lists Changes  Orders: Added new Service order of Admin of Therapeutic Inj  intramuscular or subcutaneous (66440) - Signed Added new Service order of Vit B12  1000 mcg (H4742) - Signed       Medication Administration  Injection # 1:    Medication: Vit B12 1000 mcg    Diagnosis: B12 DEFICIENCY (ICD-266.2)    Route: SQ    Site: L deltoid    Exp Date: 02/09/2012    Lot #: [id number]    Mfr: American Regent    Patient tolerated injection without complications    Given by: Alfred Levins, CMA (July 25, 2010 10:19 AM)  Orders Added: 1)  Admin of Therapeutic Inj  intramuscular or subcutaneous [96372] 2)  Vit B12 1000 mcg [J3420]

## 2010-09-11 NOTE — Assessment & Plan Note (Signed)
Summary: start insulin, pt to bring vial 70/30//et.Marland KitchenBUMP R/S.Marland KitchenSLM   Vital Signs:  Patient profile:   70 year old male Height:      69 inches Weight:      214 pounds BMI:     31.72 Temp:     98.2 degrees F oral BP sitting:   140 / 70  (left arm) Cuff size:   regular  Vitals Entered By: Kern Reap CMA Duncan Dull) (September 27, 2009 11:49 AM)  Reason for Visit follow up DM Is Patient Diabetic? Yes Did you bring your meter with you today? No    Allergies: No Known Drug Allergies   Impression & Recommendations:  Problem # 1:  DM W/MANIFESTATONS NEC, TYPE II (ICD-250.80) 30 minutes FTF with patient instructions on how to administer insulin teaching performed by me  time spent: all teaching and counselling His updated medication list for this problem includes:    Lisinopril 40 Mg Tabs (Lisinopril) .Marland Kitchen... 1 by mouth once daily    Adult Aspirin Low Strength 81 Mg Tbdp (Aspirin) ..... One by mouth daily    Actoplus Met 15-850 Mg Tabs (Pioglitazone hcl-metformin hcl) .Marland Kitchen..Marland Kitchen Two times a day    Januvia 100 Mg Tabs (Sitagliptin phosphate) ..... Once daily    Relion 70/30 70-30 % Susp (Insulin isophane & regular) .Marland KitchenMarland KitchenMarland KitchenMarland Kitchen 10 units subcutaneously before breakfast and dinner insulin syringes #100/  Complete Medication List: 1)  Cyanocobalamin 1000 Mcg/ml Inj Soln (Cyanocobalamin) .... Inject 1 cc monthly 2)  Lisinopril 40 Mg Tabs (Lisinopril) .Marland Kitchen.. 1 by mouth once daily 3)  Adult Aspirin Low Strength 81 Mg Tbdp (Aspirin) .... One by mouth daily 4)  Lipitor 40 Mg Tabs (Atorvastatin calcium) .... One by mouth daily 5)  Actoplus Met 15-850 Mg Tabs (Pioglitazone hcl-metformin hcl) .... Two times a day 6)  Januvia 100 Mg Tabs (Sitagliptin phosphate) .... Once daily 7)  Meclizine Hcl 12.5 Mg Tabs (Meclizine hcl) .... One by mouth q 6 hours as needed vertigo 8)  Relion 70/30 70-30 % Susp (Insulin isophane & regular) .Marland Kitchen.. 10 units subcutaneously before breakfast and dinner insulin syringes #100/ 9)   Insulin Syringe 31g X 5/16" 1 Ml Misc (Insulin syringe-needle u-100) .... Use two times a day as directed  Patient Instructions: 1)  Please schedule a follow-up appointment in 1 month. Prescriptions: RELION 70/30 70-30 % SUSP (INSULIN ISOPHANE & REGULAR) 10 units Subcutaneously before breakfast and dinner insulin syringes #100/  #1 vial x PRN   Entered and Authorized by:   Birdie Sons MD   Signed by:   Birdie Sons MD on 09/27/2009   Method used:   Electronically to        [facility] [address] (retail)       [address]       [place], [place]  [address]       Ph: [phone] or [phone]       Fax: [phone]   RxID:   [id number]

## 2010-09-11 NOTE — Miscellaneous (Signed)
Summary: diabetic eye exam  Clinical Lists Changes  Observations: Added new observation of EYES COMMENT: 10/2010 (10/10/2009 13:27) Added new observation of EYE EXAM BY: Dorothyann Gibbs MD (10/04/2009 13:28) Added new observation of DMEYEEXMRES: normal (10/04/2009 13:28) Added new observation of DIAB EYE EX: normal (10/04/2009 13:28)      Diabetes Management History:      He has not been enrolled in the "Diabetic Education Program".  He is checking home blood sugars.  He says that he is not exercising regularly.    Diabetes Management Exam:    Eye Exam:       Eye Exam done elsewhere          Date: 10/04/2009          Results: normal          Done by: Dorothyann Gibbs MD  Diabetes Management Assessment/Plan:      The following lipid goals have been established for the patient: Total cholesterol goal of 200; LDL cholesterol goal of 70; HDL cholesterol goal of 40; Triglyceride goal of 150.

## 2010-09-11 NOTE — Assessment & Plan Note (Signed)
Summary: 3 MNTH ROV//SLM   Vital Signs:  Patient profile:   70 year old male Weight:      208 pounds Temp:     97.8 degrees F Pulse rate:   72 / minute Resp:     12 per minute BP sitting:   112 / 62  (left arm)  Vitals Entered By: Gladis Riffle, RN (September 13, 2009 10:00 AM) CC: 3 month rov Is Patient Diabetic? Yes Did you bring your meter with you today? No Comments stopped chlorthalidone due to sleepy from hypotension at 127/52 at home--133/63 and 143/63 since off at home  CBGs 120-123 at home, once 112 and 90   CC:  3 month rov.  History of Present Illness:  Follow-Up Visit      This is a 70 year old man who presents for Follow-up visit.  The patient denies chest pain.  Since the last visit the patient notes no new problems or concerns.  The patient reports taking meds as prescribed, not monitoring BP, and not monitoring blood sugars.  When questioned about possible medication side effects, the patient notes none.  All other systems reviewed and were negative . pt states taht chlorthalidone caused profound sleepiness and bps 100-110/50s. 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 Inject 1 Cc Monthly 2)  Lisinopril 40 Mg Tabs (Lisinopril) .Marland Kitchen.. 1 By Mouth Once Daily 3)  Adult Aspirin Low Strength 81 Mg  Tbdp (Aspirin)  .... One By Mouth Daily 4)  Lipitor 40 Mg  Tabs (Atorvastatin Calcium) .... One By Mouth Daily 5)  Actoplus Met 15-850 Mg  Tabs (Pioglitazone Hcl-Metformin Hcl) .... Two Times A Day 6)  Januvia 100 Mg  Tabs (Sitagliptin Phosphate) .... Once Daily 7)  Meclizine Hcl 12.5 Mg Tabs (Meclizine Hcl) .... One By Mouth Q 6 Hours As Needed Vertigo  Allergies (verified): No Known Drug Allergies  Past History:  Past Medical History: Last updated: 09/17/2008 Anemia-iron deficiency Diabetes mellitus, type I Diverticulosis, colon Hypertension MRSA Cerebrovascular accident, hx of    Past Surgical History: Last updated: 09/18/2008 Cataract extraction-Bilat Back sx Colonoscopy-01/03/2006 Perforated bowel shoulder surgery january 2009  Supple   Family History: Last updated: 02/08/2007 Family History Diabetes 1st degree relative  Social History: Last updated: 09/17/2008 Former Smoker Retired Married Alcohol use-no 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   Eyes:  pupils equal and pupils round.   Ears:  R ear normal and L ear normal.   Neck:  no mass and no carotid bruit  Chest Wall:  no deformities.   Lungs:  normal respiratory effort and no intercostal retractions.   Heart:  normal rate and regular rhythm.   Abdomen:  Bowel sounds positive,abdomen soft and non-tender without masses, organomegaly or hernias noted. Msk:  No deformity or scoliosis noted of thoracic or lumbar spine.   Pulses:  R radial normal and L radial normal.   Neurologic:  cranial nerves II-XII intact and gait normal.    Diabetes Management Exam:    Eye Exam:        Eye Exam done elsewhere          Date: 08/10/2009          Results: normal-pt's report          Done by: ophthal   Impression & Recommendations:  Problem # 1:  HYPERTENSION (ICD-401.9)  BP well controlled off chlorthalidone---ok to stay off of it The following medications were removed from the medication list:    Chlorthalidone 25 Mg Tabs (Chlorthalidone) .Marland Kitchen... Take 1 tab by mouth every day His updated medication list for this problem includes:    Lisinopril 40 Mg Tabs (Lisinopril) .Marland Kitchen... 1 by mouth once daily  BP today: 112/62 Prior BP: 154/86 (06/14/2009)  Prior 10 Yr Risk Heart Disease: 22 % (04/01/2007)  Labs Reviewed: K+: 4.6 (06/14/2009) Creat: : 1.1 (06/14/2009)   Chol: 138 (06/14/2009)   HDL: 54.90 (06/14/2009)   LDL: 73 (06/14/2009)   TG: 50.0 (06/14/2009)  Orders: Venipuncture (04540)  Problem # 2:  DM W/MANIFESTATONS NEC, TYPE II (ICD-250.80)  check labs today His updated medication list for this problem includes:    Lisinopril 40 Mg Tabs (Lisinopril) .Marland Kitchen... 1 by mouth once daily    Adult Aspirin Low Strength 81 Mg Tbdp (Aspirin) ..... One by mouth daily    Actoplus Met 15-850 Mg Tabs (Pioglitazone hcl-metformin hcl) .Marland Kitchen..Marland Kitchen Two times a day    Januvia 100 Mg Tabs (Sitagliptin phosphate) ..... Once daily  Labs Reviewed: Creat: 1.1 (06/14/2009)     Last Eye Exam: normal-pt's report (08/10/2009) Reviewed HgBA1c results: 8.2 (06/14/2009)  7.7 (11/16/2008)  Problem # 3:  B12 DEFICIENCY (ICD-266.2)  check labs toda Orders: Vit B12 1000 mcg (J3420) Admin of Therapeutic Inj  intramuscular or subcutaneous (98119) Venipuncture (14782)  Problem # 4:  HYPERLIPIDEMIA NEC/NOS (ICD-272.4)  labs today previously controlled His updated medication list for this problem includes:    Lipitor 40 Mg Tabs (Atorvastatin calcium) ..... One by mouth daily  Labs Reviewed: SGOT: 19 (05/17/2008)   SGPT: 17 (06/14/2009)  Lipid Goals: Chol Goal: 200 (04/01/2007)   HDL Goal: 40  (04/01/2007)   LDL Goal: 70 (07/01/2007)   TG Goal: 150 (04/01/2007)  Prior 10 Yr Risk Heart Disease: 22 % (04/01/2007)   HDL:54.90 (06/14/2009), 60.70 (11/16/2008)  LDL:73 (06/14/2009), 73 (11/16/2008)  Chol:138 (06/14/2009), 143 (11/16/2008)  Trig:50.0 (06/14/2009), 48.0 (11/16/2008)  Orders: Venipuncture (95621)  Complete Medication List: 1)  Cyanocobalamin 1000 Mcg/ml Inj Soln (Cyanocobalamin) .... Inject 1 cc monthly 2)  Lisinopril 40 Mg Tabs (Lisinopril) .Marland Kitchen.. 1 by mouth once daily 3)  Adult Aspirin Low Strength 81 Mg Tbdp (Aspirin) .... One by mouth daily 4)  Lipitor 40 Mg Tabs (Atorvastatin calcium) .... One by mouth daily 5)  Actoplus Met 15-850 Mg Tabs (Pioglitazone hcl-metformin hcl) .... Two times a day 6)  Januvia 100 Mg Tabs (Sitagliptin phosphate) .... Once daily 7)  Meclizine Hcl 12.5 Mg Tabs (Meclizine hcl) .... One by mouth q 6 hours as needed vertigo  Other  Orders: TLB-A1C / Hgb A1C (Glycohemoglobin) (83036-A1C) TLB-BMP (Basic Metabolic Panel-BMET) (80048-METABOL) TLB-Lipid Panel (80061-LIPID) TLB-ALT (SGPT) (84460-ALT)  Patient Instructions: 1)  Please schedule a follow-up appointment in 4 months.   Medication Administration  Injection # 1:    Medication: Vit B12 1000 mcg    Diagnosis: B12 DEFICIENCY (ICD-266.2)    Route: IM    Site: L deltoid    Exp Date: 11/09/2010    Lot #: [id number]    Mfr: American Regent    Patient tolerated injection without complications    Given by: Gladis Riffle, RN (September 13, 2009 10:10 AM)  Orders Added: 1)  Vit B12 1000 mcg [J3420] 2)  Admin of Therapeutic Inj  intramuscular or subcutaneous [96372] 3)  TLB-A1C / Hgb A1C (Glycohemoglobin) [83036-A1C] 4)  TLB-BMP (Basic Metabolic Panel-BMET) [80048-METABOL] 5)  TLB-Lipid Panel [80061-LIPID] 6)  TLB-ALT (SGPT) [84460-ALT] 7)  Venipuncture [56213] 8)  Est. Patient Level IV [08657]

## 2010-09-19 ENCOUNTER — Other Ambulatory Visit (INDEPENDENT_AMBULATORY_CARE_PROVIDER_SITE_OTHER): Payer: BC Managed Care – PPO | Admitting: Internal Medicine

## 2010-09-19 DIAGNOSIS — T887XXA Unspecified adverse effect of drug or medicament, initial encounter: Secondary | ICD-10-CM

## 2010-09-19 DIAGNOSIS — E785 Hyperlipidemia, unspecified: Secondary | ICD-10-CM

## 2010-09-19 DIAGNOSIS — E119 Type 2 diabetes mellitus without complications: Secondary | ICD-10-CM

## 2010-09-19 LAB — HEPATIC FUNCTION PANEL
Albumin: 3.7 g/dL (ref 3.5–5.2)
Alkaline Phosphatase: 127 U/L — ABNORMAL HIGH (ref 39–117)
Total Bilirubin: 0.5 mg/dL (ref 0.3–1.2)

## 2010-09-19 LAB — BASIC METABOLIC PANEL
CO2: 28 mEq/L (ref 19–32)
Chloride: 102 mEq/L (ref 96–112)
Creatinine, Ser: 1.1 mg/dL (ref 0.4–1.5)
Glucose, Bld: 106 mg/dL — ABNORMAL HIGH (ref 70–99)
Sodium: 137 mEq/L (ref 135–145)

## 2010-09-19 LAB — LIPID PANEL
LDL Cholesterol: 57 mg/dL (ref 0–99)
Total CHOL/HDL Ratio: 2
Triglycerides: 40 mg/dL (ref 0.0–149.0)

## 2010-10-02 ENCOUNTER — Encounter: Payer: Self-pay | Admitting: Internal Medicine

## 2010-10-03 ENCOUNTER — Ambulatory Visit: Payer: Self-pay | Admitting: Internal Medicine

## 2010-10-17 ENCOUNTER — Ambulatory Visit (INDEPENDENT_AMBULATORY_CARE_PROVIDER_SITE_OTHER): Payer: BC Managed Care – PPO | Admitting: Internal Medicine

## 2010-10-17 ENCOUNTER — Encounter: Payer: Self-pay | Admitting: Internal Medicine

## 2010-10-17 VITALS — BP 136/74 | HR 72 | Temp 98.5°F | Ht 71.0 in | Wt 205.0 lb

## 2010-10-17 DIAGNOSIS — E119 Type 2 diabetes mellitus without complications: Secondary | ICD-10-CM

## 2010-10-17 DIAGNOSIS — E785 Hyperlipidemia, unspecified: Secondary | ICD-10-CM

## 2010-10-17 DIAGNOSIS — E538 Deficiency of other specified B group vitamins: Secondary | ICD-10-CM

## 2010-10-17 DIAGNOSIS — I10 Essential (primary) hypertension: Secondary | ICD-10-CM

## 2010-10-17 MED ORDER — NEOMYCIN-POLYMYXIN-HC 3.5-10000-1 OT SOLN: 3.0000 [drp] | Freq: Three times a day (TID) | OTIC | Status: AC

## 2010-10-17 MED ORDER — INSULIN NPH ISOPHANE & REGULAR (70-30) 100 UNIT/ML ~~LOC~~ SUSP: SUBCUTANEOUS | Status: DC

## 2010-10-17 MED ORDER — CYANOCOBALAMIN 1000 MCG/ML IJ SOLN
1000.0000 ug | INTRAMUSCULAR | Status: DC
  Administered 2010-10-17 – 2011-07-13 (×2): 1000 ug via INTRAMUSCULAR

## 2010-10-17 NOTE — Assessment & Plan Note (Signed)
Adequate control Continue meds 

## 2010-10-17 NOTE — Progress Notes (Signed)
  Subjective:    Patient ID: Scott Vasquez, male    DOB: Feb 01, 1941, 70 y.o.   MRN: [id number]  HPI   patient comes in for followup of multiple medical problems including type 2 diabetes, hyperlipidemia, hypertension. The patient does not check blood sugar or blood pressure at home. The patetient does not follow an exercise or diet program. The patient denies any polyuria, polydipsia.  In the past the patient has gone to diabetic treatment center. The patient is tolerating medications  Without difficulty. The patient does admit to medication compliance.   Past Medical History  Diagnosis Date  . Anemia     iron deficiency  . Diabetes mellitus     type II  . Hypertension   . Stroke   . MRSA (methicillin resistant Staphylococcus aureus)   . Diverticulosis of colon    Past Surgical History  Procedure Date  . Cataract extraction, bilateral   . Back surgery   . Shoulder surgery 08/2007    Supple    reports that he has quit smoking. He does not have any smokeless tobacco history on file. He reports that he does not drink alcohol. His drug history not on file. family history includes Diabetes in an unspecified family member. No Known Allergies  No Known Allergies  Review of Systems  patient denies chest pain, shortness of breath, orthopnea. Denies lower extremity edema, abdominal pain, change in appetite, change in bowel movements. Patient denies rashes, musculoskeletal complaints. No other specific complaints in a complete review of systems.      Objective:   Physical Exam  well-developed well-nourished male in no acute distress. HEENT exam atraumatic, normocephalic, neck supple without jugular venous distention. Chest clear to auscultation cardiac exam S1-S2 are regular. Abdominal exam overweight with bowel sounds, soft and nontender. Extremities no edema. Neurologic exam is alert with a normal gait.        Assessment & Plan:

## 2010-10-17 NOTE — Assessment & Plan Note (Addendum)
Not as well controlled as I would like New dose of insulin---see meds

## 2010-10-17 NOTE — Assessment & Plan Note (Signed)
Well controlled. Continue current medications  

## 2010-10-23 ENCOUNTER — Telehealth: Payer: Self-pay | Admitting: Internal Medicine

## 2010-10-23 NOTE — Telephone Encounter (Signed)
Pt has an ear ache. Pt was prescribed ear drops, but it is not helping. Pt req a different med called in to [facility] [phone]

## 2010-10-24 NOTE — Telephone Encounter (Signed)
Pt needs an OV, Dr Cato Mulligan is not here and he should someone look at his ear

## 2010-10-24 NOTE — Telephone Encounter (Signed)
Lft vm for pt to cb to sch ov with another dr, as noted. Waiting on cb

## 2010-10-29 ENCOUNTER — Other Ambulatory Visit: Payer: Self-pay | Admitting: *Deleted

## 2010-10-29 DIAGNOSIS — E119 Type 2 diabetes mellitus without complications: Secondary | ICD-10-CM

## 2010-10-29 MED ORDER — INSULIN NPH ISOPHANE & REGULAR (70-30) 100 UNIT/ML ~~LOC~~ SUSP: 10.0000 [IU] | Freq: Two times a day (BID) | SUBCUTANEOUS | Status: DC

## 2010-12-26 NOTE — Consult Note (Signed)
[place]. [facility]  Patient:    CHRISEAN, KLOTH                         MRN: [id number] Proc. Date: 02/27/00 Adm. Date:  [id number] Disc. Date: [id number] Attending:  Doug Sou CC:         Kristian Covey, M.D.                          Consultation Report  DATE OF BIRTH:  [birth year]  CHIEF COMPLAINT:  Left-sided numbness.  HISTORY OF PRESENT ILLNESS:  Approximately 6 days ago the patient had onset of numbness beginning in his left arm spreading to the leg, then his trunk and his face.  This happened over a period of hours.  The patient did not seek care until Monday when he was seen by his primary care physician, Dr. Caryl Never.  I do not have a copy of that interaction but the patient was sent to [facility] for a CT scan of the brain with a history of left-sided numbness, evaluated for stroke.  The patient is a diabetic.  We have a report, but not the films, from [place] that shows ______ of ventricular system were normal.  No intra or extra axial fluid collection, no mass effect or midline shift.  Great white matter differentiation was unremarkable.  No focal abnormality was seen.  Visualized portions of the sinuses were normal.  The patient was seen again by Dr. Caryl Never on Thursday.  He again found no change from Monday.  There was subjective numbness but no other focal or lateralized abnormality.  The patient had some drawing of his hand which kept him awake last night. This concerned him.  He called Dr. Yehuda Budd today who called our office. The patient was given an appointment for a month from now.  When he voiced concern about that, he was told to come to the emergency room where he would be seen by the neurologist on call.  The patient was then evaluated by the emergency room physician, Dr. Doug Sou, M.D.  Dr. Ethelda Chick found evidence of numbness on the left side that was subjective but found no other  objective findings.  He contacted me and I agreed to see the patient in evaluation.  REVIEW OF SYSTEMS:  Positive for a 5 year history of non-insulin-dependent type 2 diabetes mellitus, recent hypertension and hypercholesterolemia, chronic low back pain and arthritis.  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  CURRENT MEDICATIONS: 1. Glucophage. 2. Hyzaar,. 3. Glucotrol 4. Plavix 5. Actos 6. Lipitor 7. Diclofenac 8. The patient had been on a baby aspirin until yesterday when he was placed    on Plavix.  FAMILY HISTORY:  Markedly positive for hypertension, diabetes.  Mother had hypertension and died of a stroke.  Father had diabetes and died of complications in a diabetic coma.  One of his sisters died of cancer of the abdominal organs.  He has two other sisters both of whom are diabetic.  He has 2 children who are healthy.  SOCIAL HISTORY:  The patient is married.  He stopped smoking cigarettes in 1974.  He does not use alcohol although he used it on weekends regularly until a number of years ago.  The patient has an 11th grade education and works in [employer].  REVIEW OF SYSTEMS:  The patient has not had headache, loss of consciousness, seizures.  He has complained of some blurred vision in his left eye.  He has had not dysarthria, dysphagia, tinnitus, syncope, vertigo, weakness, or loss of bowel or bladder control.  He has had no change in his gait.  He has not fallen.  He has had no injuries to his head and neck.  The patient was having problems with sciatica and had a lumbar MRI carried out in late June.  He was scheduled to see Julio Sicks, M.D., next week.  His symptoms have subsided.  The patient has not had any intercurrent infections in the head or neck, lungs, gastrointestinal or genitourinary, night sweats, fever, rash, anemia, bruisability, thyroid disease.  He has had  no fractures.  REVIEW OF SYSTEMS:  Otherwise negative.  PHYSICAL EXAMINATION:  GENERAL:  This is a pleasant well-developed, right-handed 70 year old gentleman in no distress.  VITAL SIGNS:  Blood pressure 112/66, resting pulse 72, respirations 20, temperature 97.0, pulse oximetry 96%.  HEENT:  No signs of infection.  NECK:  Supple.  Slight decreased range of motion.  No cranial or cervical bruits.  LUNGS:  Clear to auscultation.  HEART:  No murmurs, pulses normal.  ABDOMEN:  Soft, nontender, somewhat protuberant.  He has an old scar and no hepatosplenomegaly.  EXTREMITIES:  Well formed.  SKIN:  Somewhat dry.  There is no edema, no abnormality in capillary refill. There is no alterations in tone or tight heel cords.  NEUROLOGIC:  Mental status:  Patient was awake, alert, attentive, appropriate, no dysphagia, or dyspraxia.  Cranial nerves examination:  Round reactive pupils.  Normal fundi.  Full visual fields to double simultaneous stimuli. Extraocular movements full and conjugate.  Okay in responses equal bilaterally.  Symmetric facial strength and sensation.  Visual acuity was not tested.  He was able to protrude his tongue, and elevate his uvula midline. Air conduction greater than bone conduction bilaterally.  I neglected to mention that the patient has bilateral cataracts I am able to see in the right eye, much better than the left, which may account for the blurred vision in his eye.  Motor examination:  Normal strength, tone, and mass.  Good fine motor movements, no pronator drift.  He has excellent strength in all 4 extremities.  Sensation:  Shows subjective numbness on the left side that does not split the midline.  The patient has altered sensation to when I rub his skin but really does not have significant altered sensation to cold, vibration, proprioception.  His stereoagnosis is somewhat better in the right hand then the left.  Two point discrimination was  5/10ths of a mm in the right hand, 1 cm in the left.   Cerebellar examination:  Good finger to nose.  Rapid repetitive movements. Gait and station were fairly normal, although he walks carefully.  He had negative Romberg.  He could walk on his heels and toes and form a tandem without difficulty. Reflexes are diminished.  He had no response to plantar stimulation bilaterally.  IMPRESSION:  I suspect the patient has had a small lacunar infarction either in his right thalamus or the right posterior limb of the internal capsule. This is tiny and seems to have been stable over the past 6 days.  The patient has a number of risk factors for this.  I cannot rule out the possibility of more proximal disease but think it unlikely.  PLAN:  The patient will need to be evaluated with an  MRI of the brain and an MR intracranial study.  I would also consider 2-D echocardiogram and carotid Doppler.  I believe that this all can be obtained as an outpatient because he has been stable.  I would recommend that he continue aggressive secondary stroke management for his blood pressure, lipids and diabetes and in particular his diabetes.  His glucose today is 64 which may indicate a degree of sugar control.  He is not adhering to diet or checked his sugars on a daily basis.  [facility]  The patient does not smoke.  I think that the combination of 325 mg of aspirin and 75 mg of plavix is a good idea for him given that he likely has a more widespread than just the cerebrovascular disease.  Because of his debility I do not feel that it is imperative to admit the patient and the family agrees at this time.  I told the family should he progress during the weekend that he would need to come back and be admitted and placed on heparin and work-up would proceed as I have outlined above.  If the MRI scan and MRA can be carried out at [facility] I would recommend  going ahead with that.  If not, it needs to be done either at [facility] or at [facility] and will undoubtedly be somewhat delayed.  I appreciate the opportunity to see the patient.  If you have any questions about this or I can be of assistance do not hesitate to contact me. DD:  02/27/00 TD:  03/01/00 Job: [id number]

## 2010-12-26 NOTE — Op Note (Signed)
[name]. [facility]  Patient:    Scott Vasquez, Scott Vasquez                         MRN: [id number] Proc. Date: 05/25/00 Adm. Date:  [id number] Attending:  Sabino Gasser CC:         Tammy R. Collins [name], M.D.   Operative Report  PROCEDURE PERFORMED:  Upper endoscopy.  ENDOSCOPIST:  Sabino Gasser, M.D.  ANESTHESIA:  Demerol 60 mg, Versed 6 mg was given ____________ for two procedures.  INDICATIONS FOR PROCEDURE:  Iron deficiency, blood loss anemia.  DESCRIPTION OF PROCEDURE:  With the patient mildly sedated in the left lateral decubitus position, the Olympus video endoscope was inserted in the mouth and passed under direct vision through the esophagus into the stomach, which appeared normal in to the stomach.  There was a fleck of blood seen in the fundus indicating that there had been some small degree of bleeding but this otherwise appeared normal.  We advanced to the antrum.  The prepyloric area was mildly edematous and erythema was seen, photographed and subsequently biopsied.  We were able then to get through this area into the duodenal bulb and second portion of the duodenum both of which appeared normal and were photographed.  From this point, the endoscope was slowly withdrawn taking circumferential views of the entire duodenal mucosa until the endoscope had been pulled back into the stomach and placed on retroflexion to view the stomach from below and this too, appeared normal and was photographed.  The endoscope was then straightened and pulled back from distal to proximal stomach taking circumferential views of the entire gastric and subsequently esophageal mucosa, which otherwise appeared normal.  Patients vital signs and pulse oximeter remained stable.  The patient tolerated the procedure well without apparent complications.  FINDINGS:  Small amount of bleed seen in the stomach, etiology not clear. Mild erythema of distal stomach.  Await biopsy report.  Patient  will call me for results and will follow up with me as an outpatient.  Will also then proceed to colonoscopy.  PLAN: DD:  05/25/00 TD:  05/25/00 Job: [id number]

## 2010-12-26 NOTE — Procedures (Signed)
[place]. [facility]  Patient:    Scott Vasquez, Scott Vasquez                         MRN: [id number] Proc. Date: 05/25/00 Adm. Date:  [id number] Attending:  Sabino Gasser CC:         Tammy R. Collins [name], M.D.   Procedure Report  PROCEDURE PERFORMED:  Colonoscopy.  ENDOSCOPIST:  Sabino Gasser, M.D.  INDICATIONS FOR PROCEDURE:  Iron deficiency, blood loss anemia.  ANESTHESIA:  See endoscopy note.  Total of 60 mg of Demerol and 6 mg Versed given for combined procedure.  DESCRIPTION OF PROCEDURE:  With the patient mildly sedated in the left lateral decubitus position, a rectal exam was performed which was unremarkable. Subsequently, the Olympus videoscopic colonoscope was inserted in the and passed under direct vision into the cecum.  The cecum was identified by the ileocecal valve and appendiceal orifice, both of which were photographed.  A time release pill was seen there in the cecum.  From this point, the colonoscope was slowly withdrawn, taking circumferential views of the entire colonic mucosa stopping at approximately 30 from the anal verge at which point an ulceration was seen, photographed and biopsied.  There was surrounding erythema of this ulceration, the etiology of which was unclear, possibly NSAID but ____________ distal end of the colon.  The endoscope was then further withdrawn to the rectum, which appeared normal on direct and showed internal hemorrhoids on retroflex view.  The endoscope was straightened and withdrawn. Patients vital signs and pulse oximeter remained stable.  The patient tolerated the procedure well and without apparent complications.  FINDINGS:  Ulceration at 30 cm from anal verge, await biopsy report.  Patietn will call me for results and will follow up with me as an outpatient. DD:  05/25/00 TD:  05/25/00 Job: [id number]

## 2011-01-16 ENCOUNTER — Ambulatory Visit (INDEPENDENT_AMBULATORY_CARE_PROVIDER_SITE_OTHER): Payer: BC Managed Care – PPO | Admitting: Internal Medicine

## 2011-01-16 DIAGNOSIS — E538 Deficiency of other specified B group vitamins: Secondary | ICD-10-CM

## 2011-01-16 DIAGNOSIS — I10 Essential (primary) hypertension: Secondary | ICD-10-CM

## 2011-01-16 DIAGNOSIS — E785 Hyperlipidemia, unspecified: Secondary | ICD-10-CM

## 2011-01-16 DIAGNOSIS — Z23 Encounter for immunization: Secondary | ICD-10-CM

## 2011-01-16 DIAGNOSIS — E119 Type 2 diabetes mellitus without complications: Secondary | ICD-10-CM

## 2011-01-16 LAB — LIPID PANEL
Cholesterol: 133 mg/dL (ref 0–200)
LDL Cholesterol: 74 mg/dL (ref 0–99)
Triglycerides: 40 mg/dL (ref 0.0–149.0)
VLDL: 8 mg/dL (ref 0.0–40.0)

## 2011-01-16 LAB — BASIC METABOLIC PANEL
BUN: 17 mg/dL (ref 6–23)
Chloride: 104 mEq/L (ref 96–112)
Creatinine, Ser: 1.1 mg/dL (ref 0.4–1.5)
Glucose, Bld: 105 mg/dL — ABNORMAL HIGH (ref 70–99)
Potassium: 4.9 mEq/L (ref 3.5–5.1)

## 2011-01-16 LAB — HEMOGLOBIN A1C: Hgb A1c MFr Bld: 8.1 % — ABNORMAL HIGH (ref 4.6–6.5)

## 2011-01-16 LAB — HEPATIC FUNCTION PANEL
Albumin: 4.1 g/dL (ref 3.5–5.2)
Total Bilirubin: 0.4 mg/dL (ref 0.3–1.2)

## 2011-01-16 NOTE — Assessment & Plan Note (Signed)
Controlled Continue meds 

## 2011-01-16 NOTE — Assessment & Plan Note (Signed)
Adequate control Continue meds 

## 2011-01-16 NOTE — Assessment & Plan Note (Signed)
Fair control Continue same meds 

## 2011-01-16 NOTE — Progress Notes (Signed)
  Subjective:    Patient ID: Scott Vasquez, male    DOB: [birth year], 70 y.o.   MRN: [id number]  HPI  patient comes in for followup of multiple medical problems including type 2 diabetes, hyperlipidemia, hypertension. The patient does not check blood sugar or blood pressure at home. The patetient does not follow an exercise or diet program. The patient denies any polyuria, polydipsia.  In the past the patient has gone to diabetic treatment center. The patient is tolerating medications  Without difficulty. The patient does admit to medication compliance.  Past Medical History  Diagnosis Date  . Anemia     iron deficiency  . Diabetes mellitus     type II  . Hypertension   . Stroke   . MRSA (methicillin resistant Staphylococcus aureus)   . Diverticulosis of colon    Past Surgical History  Procedure Date  . Cataract extraction, bilateral   . Back surgery   . Shoulder surgery 08/2007    Supple    reports that he has quit smoking. He does not have any smokeless tobacco history on file. He reports that he does not drink alcohol. His drug history not on file. family history includes Diabetes in an unspecified family member. No Known Allergies    Review of Systems  patient denies chest pain, shortness of breath, orthopnea. Denies lower extremity edema, abdominal pain, change in appetite, change in bowel movements. Patient denies rashes, musculoskeletal complaints. No other specific complaints in a complete review of systems.      Objective:   Physical Exam  well-developed well-nourished male in no acute distress. HEENT exam atraumatic, normocephalic, neck supple without jugular venous distention. Chest clear to auscultation cardiac exam S1-S2 are regular. Abdominal exam overweight with bowel sounds, soft and nontender. Extremities no edema. Neurologic exam is alert with a normal gait.        Assessment & Plan:

## 2011-01-16 NOTE — Assessment & Plan Note (Signed)
Injection today 

## 2011-03-31 ENCOUNTER — Other Ambulatory Visit: Payer: Self-pay | Admitting: *Deleted

## 2011-03-31 MED ORDER — SITAGLIPTIN PHOS-METFORMIN HCL 50-1000 MG PO TABS: 1.0000 | ORAL_TABLET | Freq: Every day | ORAL | Status: DC

## 2011-05-22 ENCOUNTER — Other Ambulatory Visit: Payer: Self-pay | Admitting: *Deleted

## 2011-05-22 ENCOUNTER — Ambulatory Visit: Payer: BC Managed Care – PPO | Admitting: Internal Medicine

## 2011-05-22 MED ORDER — ATORVASTATIN CALCIUM 40 MG PO TABS: 40.0000 mg | ORAL_TABLET | Freq: Every day | ORAL | Status: DC

## 2011-07-10 ENCOUNTER — Ambulatory Visit: Payer: BC Managed Care – PPO | Admitting: Internal Medicine

## 2011-07-13 ENCOUNTER — Encounter: Payer: Self-pay | Admitting: Internal Medicine

## 2011-07-13 ENCOUNTER — Ambulatory Visit (INDEPENDENT_AMBULATORY_CARE_PROVIDER_SITE_OTHER): Payer: BC Managed Care – PPO | Admitting: Internal Medicine

## 2011-07-13 VITALS — BP 140/80 | HR 80 | Temp 98.4°F | Ht 70.5 in | Wt 208.0 lb

## 2011-07-13 DIAGNOSIS — I10 Essential (primary) hypertension: Secondary | ICD-10-CM

## 2011-07-13 DIAGNOSIS — Z23 Encounter for immunization: Secondary | ICD-10-CM

## 2011-07-13 DIAGNOSIS — D509 Iron deficiency anemia, unspecified: Secondary | ICD-10-CM

## 2011-07-13 DIAGNOSIS — E119 Type 2 diabetes mellitus without complications: Secondary | ICD-10-CM

## 2011-07-13 LAB — LIPID PANEL
Cholesterol: 129 mg/dL (ref 0–200)
HDL: 48.5 mg/dL (ref >39.00)
Total CHOL/HDL Ratio: 3
Triglycerides: 48 mg/dL (ref 0.0–149.0)

## 2011-07-13 LAB — BASIC METABOLIC PANEL
BUN: 17 mg/dL (ref 6–23)
CO2: 26 mEq/L (ref 19–32)
Calcium: 9.1 mg/dL (ref 8.4–10.5)
Chloride: 107 mEq/L (ref 96–112)
Creatinine, Ser: 1.1 mg/dL (ref 0.4–1.5)

## 2011-07-13 LAB — HEPATIC FUNCTION PANEL
Bilirubin, Direct: 0 mg/dL (ref 0.0–0.3)
Total Bilirubin: 0.2 mg/dL — ABNORMAL LOW (ref 0.3–1.2)
Total Protein: 7.7 g/dL (ref 6.0–8.3)

## 2011-07-13 NOTE — Assessment & Plan Note (Signed)
CBC:    Component Value Date/Time   WBC 6.0 05/02/2010 0913   HGB 11.4* 05/02/2010 0913   HCT 34.3* 05/02/2010 0913   PLT 241.0 05/02/2010 0913   MCV 87.9 05/02/2010 0913   NEUTROABS 3.6 05/02/2010 0913   LYMPHSABS 0.8 05/02/2010 0913   MONOABS 0.6 05/02/2010 0913   EOSABS 0.9* 05/02/2010 0913   BASOSABS 0.0 05/02/2010 0913   Recheck labs today-

## 2011-07-13 NOTE — Assessment & Plan Note (Signed)
Lab Results  Component Value Date   HGBA1C 8.1* 01/16/2011   Check labs today Encouraged low calorie diet, regular exercise and weight loss.

## 2011-07-13 NOTE — Assessment & Plan Note (Addendum)
BP Readings from Last 3 Encounters:  07/13/11 140/80  01/16/11 146/76  10/17/10 136/74

## 2011-07-13 NOTE — Progress Notes (Signed)
Patient ID: Scott Vasquez, male   DOB: May 24, 1941, 70 y.o.   MRN: [id number]  patient comes in for followup of multiple medical problems including type 2 diabetes, hyperlipidemia, hypertension. The patient does not check blood sugar or blood pressure at home. The patetient does not follow an exercise or diet program. The patient denies any polyuria, polydipsia.  In the past the patient has gone to diabetic treatment center. The patient is tolerating medications  Without difficulty. The patient does admit to medication compliance.   Past Medical History  Diagnosis Date  . Anemia     iron deficiency  . Diabetes mellitus     type II  . Hypertension   . Stroke   . MRSA (methicillin resistant Staphylococcus aureus)   . Diverticulosis of colon     History   Social History  . Marital Status: Married    Spouse Name: N/A    Number of Children: N/A  . Years of Education: N/A   Occupational History  . retired Statistician   Social History Main Topics  . Smoking status: Former Games developer  . Smokeless tobacco: Not on file  . Alcohol Use: No  . Drug Use: Not on file  . Sexually Active: Not on file   Other Topics Concern  . Not on file   Social History Narrative  . No narrative on file    Past Surgical History  Procedure Date  . Cataract extraction, bilateral   . Back surgery   . Shoulder surgery 08/2007    Supple    Family History  Problem Relation Age of Onset  . Diabetes      No Known Allergies  Current Outpatient Prescriptions on File Prior to Visit  Medication Sig Dispense Refill  . aspirin 81 MG tablet Take 81 mg by mouth daily.        Marland Kitchen atorvastatin (LIPITOR) 40 MG tablet Take 1 tablet (40 mg total) by mouth daily.  30 tablet  5  . cyanocobalamin (,VITAMIN B-12,) 1000 MCG/ML injection Inject 1,000 mcg into the muscle every 30 (thirty) days.        . insulin NPH-insulin regular (HUMULIN 70/30) (70-30) 100 UNIT/ML injection Inject 10 Units into the skin 2 (two) times daily.  Before breakfast and dinner  10 mL  11  . Insulin Syringe-Needle U-100 (INSULIN SYRINGE 1CC/31GX5/16") 31G X 5/16" 1 ML MISC Use two times a day as directed.       Marland Kitchen lisinopril (PRINIVIL,ZESTRIL) 40 MG tablet Take 40 mg by mouth daily.        . sitaGLIPtan-metformin (JANUMET) 50-1000 MG per tablet Take 1 tablet by mouth daily.  30 tablet  5   Current Facility-Administered Medications on File Prior to Visit  Medication Dose Route Frequency Provider Last Rate Last Dose  . cyanocobalamin ((VITAMIN B-12)) injection 1,000 mcg  1,000 mcg Intramuscular Q30 days Judie Petit, MD   1,000 mcg at 07/13/11 0756     patient denies chest pain, shortness of breath, orthopnea. Denies lower extremity edema, abdominal pain, change in appetite, change in bowel movements. Patient denies rashes, musculoskeletal complaints. No other specific complaints in a complete review of systems.   BP 162/74  Pulse 80  Temp(Src) 98.4 F (36.9 C) (Oral)  Ht 5' 10.5" (1.791 m)  Wt 208 lb (94.348 kg)  BMI 29.42 kg/m2  well-developed well-nourished male in no acute distress. HEENT exam atraumatic, normocephalic, neck supple without jugular venous distention. Chest clear to auscultation cardiac exam S1-S2 are regular.  Abdominal exam overweight with bowel sounds, soft and nontender. Extremities no edema. Neurologic exam is alert with a normal gait.

## 2011-09-07 ENCOUNTER — Other Ambulatory Visit: Payer: Self-pay | Admitting: Internal Medicine

## 2011-10-26 ENCOUNTER — Other Ambulatory Visit: Payer: Self-pay | Admitting: Internal Medicine

## 2011-11-30 ENCOUNTER — Other Ambulatory Visit: Payer: Self-pay | Admitting: Internal Medicine

## 2012-01-15 ENCOUNTER — Ambulatory Visit (INDEPENDENT_AMBULATORY_CARE_PROVIDER_SITE_OTHER): Payer: BC Managed Care – PPO | Admitting: Internal Medicine

## 2012-01-15 ENCOUNTER — Encounter: Payer: Self-pay | Admitting: Internal Medicine

## 2012-01-15 VITALS — BP 138/74 | HR 76 | Temp 98.1°F | Wt 203.0 lb

## 2012-01-15 DIAGNOSIS — E119 Type 2 diabetes mellitus without complications: Secondary | ICD-10-CM

## 2012-01-15 DIAGNOSIS — I10 Essential (primary) hypertension: Secondary | ICD-10-CM

## 2012-01-15 DIAGNOSIS — E538 Deficiency of other specified B group vitamins: Secondary | ICD-10-CM

## 2012-01-15 DIAGNOSIS — D509 Iron deficiency anemia, unspecified: Secondary | ICD-10-CM

## 2012-01-15 LAB — HEPATIC FUNCTION PANEL
Alkaline Phosphatase: 124 U/L — ABNORMAL HIGH (ref 39–117)
Bilirubin, Direct: 0 mg/dL (ref 0.0–0.3)
Total Bilirubin: 0.2 mg/dL — ABNORMAL LOW (ref 0.3–1.2)
Total Protein: 7.2 g/dL (ref 6.0–8.3)

## 2012-01-15 LAB — LIPID PANEL
HDL: 51 mg/dL (ref >39.00)
LDL Cholesterol: 61 mg/dL (ref 0–99)
Total CHOL/HDL Ratio: 2
VLDL: 7.6 mg/dL (ref 0.0–40.0)

## 2012-01-15 LAB — BASIC METABOLIC PANEL
BUN: 17 mg/dL (ref 6–23)
Chloride: 107 mEq/L (ref 96–112)
Creatinine, Ser: 1 mg/dL (ref 0.4–1.5)
Glucose, Bld: 59 mg/dL — ABNORMAL LOW (ref 70–99)
Potassium: 3.8 mEq/L (ref 3.5–5.1)

## 2012-01-15 LAB — HEMOGLOBIN A1C: Hgb A1c MFr Bld: 7.1 % — ABNORMAL HIGH (ref 4.6–6.5)

## 2012-01-15 NOTE — Assessment & Plan Note (Signed)
CBC:    Component Value Date/Time   WBC 6.0 05/02/2010 0913   HGB 11.4* 05/02/2010 0913   HCT 34.3* 05/02/2010 0913   PLT 241.0 05/02/2010 0913   MCV 87.9 05/02/2010 0913   NEUTROABS 3.6 05/02/2010 0913   LYMPHSABS 0.8 05/02/2010 0913   MONOABS 0.6 05/02/2010 0913   EOSABS 0.9* 05/02/2010 0913   BASOSABS 0.0 05/02/2010 0913  will check labs today

## 2012-01-15 NOTE — Assessment & Plan Note (Signed)
Reasonable control- continue meds 

## 2012-01-15 NOTE — Progress Notes (Signed)
patient comes in for followup of multiple medical problems including type 2 diabetes, hyperlipidemia, hypertension. The patient does not check blood sugar or blood pressure at home. The patetient does not follow an exercise or diet program. The patient denies any polyuria, polydipsia.  In the past the patient has gone to diabetic treatment center. The patient is tolerating medications  Without difficulty. The patient does admit to medication compliance.   Past Medical History  Diagnosis Date  . Anemia     iron deficiency  . Diabetes mellitus     type II  . Hypertension   . Stroke   . MRSA (methicillin resistant Staphylococcus aureus)   . Diverticulosis of colon     History   Social History  . Marital Status: Married    Spouse Name: N/A    Number of Children: N/A  . Years of Education: N/A   Occupational History  . retired Statistician   Social History Main Topics  . Smoking status: Former Games developer  . Smokeless tobacco: Not on file  . Alcohol Use: No  . Drug Use: Not on file  . Sexually Active: Not on file   Other Topics Concern  . Not on file   Social History Narrative  . No narrative on file    Past Surgical History  Procedure Date  . Cataract extraction, bilateral   . Back surgery   . Shoulder surgery 08/2007    Supple    Family History  Problem Relation Age of Onset  . Diabetes      No Known Allergies  Current Outpatient Prescriptions on File Prior to Visit  Medication Sig Dispense Refill  . aspirin 81 MG tablet Take 81 mg by mouth daily.        Marland Kitchen atorvastatin (LIPITOR) 40 MG tablet Take 1 tablet (40 mg total) by mouth daily.  30 tablet  5  . cyanocobalamin (,VITAMIN B-12,) 1000 MCG/ML injection Inject 1,000 mcg into the muscle every 30 (thirty) days.        Marland Kitchen HUMULIN 70/30 (70-30) 100 UNIT/ML injection 10 UNITS TWICE DAILY BEFORE BREAKFAST AND DINNER  10 mL  5  . Insulin Syringe-Needle U-100 (INSULIN SYRINGE 1CC/31GX5/16") 31G X 5/16" 1 ML MISC Use two times a  day as directed.       Marland Kitchen lisinopril (PRINIVIL,ZESTRIL) 40 MG tablet TAKE 1 TABLET ONCE DAILY  90 tablet  1  . metFORMIN (GLUCOPHAGE) 500 MG tablet TAKE 1 TABLET TWICE DAILY.  180 tablet  1  . sitaGLIPtan-metformin (JANUMET) 50-1000 MG per tablet Take 1 tablet by mouth daily.  30 tablet  5   Current Facility-Administered Medications on File Prior to Visit  Medication Dose Route Frequency Provider Last Rate Last Dose  . cyanocobalamin ((VITAMIN B-12)) injection 1,000 mcg  1,000 mcg Intramuscular Q30 days Lindley Magnus, MD   1,000 mcg at 07/13/11 0756     patient denies chest pain, shortness of breath, orthopnea. Denies lower extremity edema, abdominal pain, change in appetite, change in bowel movements. Patient denies rashes, musculoskeletal complaints. No other specific complaints in a complete review of systems.   BP 138/74  Pulse 76  Temp(Src) 98.1 F (36.7 C) (Oral)  Wt 203 lb (92.08 kg)  well-developed well-nourished male in no acute distress. HEENT exam atraumatic, normocephalic, neck supple without jugular venous distention. Chest clear to auscultation cardiac exam S1-S2 are regular. Abdominal exam overweight with bowel sounds, soft and nontender. Extremities no edema. Neurologic exam is alert with a normal gait.

## 2012-01-15 NOTE — Assessment & Plan Note (Signed)
Lab Results  Component Value Date   HGBA1C 7.3* 07/13/2011   Check labs today

## 2012-01-15 NOTE — Assessment & Plan Note (Signed)
b12 injection today.  

## 2012-04-21 ENCOUNTER — Other Ambulatory Visit: Payer: Self-pay | Admitting: Internal Medicine

## 2012-04-25 ENCOUNTER — Other Ambulatory Visit: Payer: Self-pay | Admitting: Internal Medicine

## 2012-05-19 ENCOUNTER — Other Ambulatory Visit: Payer: Self-pay | Admitting: Internal Medicine

## 2012-07-10 LAB — HM DIABETES EYE EXAM

## 2012-07-15 ENCOUNTER — Ambulatory Visit: Payer: BC Managed Care – PPO | Admitting: Internal Medicine

## 2012-07-29 ENCOUNTER — Ambulatory Visit: Payer: BC Managed Care – PPO | Admitting: Internal Medicine

## 2012-08-04 ENCOUNTER — Other Ambulatory Visit: Payer: Self-pay | Admitting: Internal Medicine

## 2012-08-19 ENCOUNTER — Ambulatory Visit (INDEPENDENT_AMBULATORY_CARE_PROVIDER_SITE_OTHER): Payer: BC Managed Care – PPO | Admitting: Internal Medicine

## 2012-08-19 ENCOUNTER — Encounter: Payer: Self-pay | Admitting: Internal Medicine

## 2012-08-19 VITALS — BP 162/82 | HR 76 | Temp 98.5°F | Wt 210.0 lb

## 2012-08-19 DIAGNOSIS — E119 Type 2 diabetes mellitus without complications: Secondary | ICD-10-CM

## 2012-08-19 DIAGNOSIS — Z23 Encounter for immunization: Secondary | ICD-10-CM

## 2012-08-19 DIAGNOSIS — I10 Essential (primary) hypertension: Secondary | ICD-10-CM

## 2012-08-19 DIAGNOSIS — D509 Iron deficiency anemia, unspecified: Secondary | ICD-10-CM

## 2012-08-19 DIAGNOSIS — E538 Deficiency of other specified B group vitamins: Secondary | ICD-10-CM

## 2012-08-19 LAB — CBC WITH DIFFERENTIAL/PLATELET
Basophils Absolute: 0 10*3/uL (ref 0.0–0.1)
Basophils Relative: 0.3 % (ref 0.0–3.0)
Eosinophils Absolute: 0.3 10*3/uL (ref 0.0–0.7)
HCT: 35.9 % — ABNORMAL LOW (ref 39.0–52.0)
Hemoglobin: 11.8 g/dL — ABNORMAL LOW (ref 13.0–17.0)
Lymphs Abs: 0.9 10*3/uL (ref 0.7–4.0)
MCHC: 32.8 g/dL (ref 30.0–36.0)
MCV: 85.3 fl (ref 78.0–100.0)
Monocytes Absolute: 0.5 10*3/uL (ref 0.1–1.0)
Neutro Abs: 4.5 10*3/uL (ref 1.4–7.7)
RBC: 4.21 Mil/uL — ABNORMAL LOW (ref 4.22–5.81)
RDW: 13.6 % (ref 11.5–14.6)

## 2012-08-19 LAB — HEPATIC FUNCTION PANEL
ALT: 17 U/L (ref 0–53)
Total Bilirubin: 0.5 mg/dL (ref 0.3–1.2)
Total Protein: 7.7 g/dL (ref 6.0–8.3)

## 2012-08-19 LAB — BASIC METABOLIC PANEL
BUN: 18 mg/dL (ref 6–23)
CO2: 29 mEq/L (ref 19–32)
Chloride: 104 mEq/L (ref 96–112)
Creatinine, Ser: 1.3 mg/dL (ref 0.4–1.5)

## 2012-08-19 LAB — LIPID PANEL
Cholesterol: 117 mg/dL (ref 0–200)
Triglycerides: 48 mg/dL (ref 0.0–149.0)

## 2012-08-19 MED ORDER — CYANOCOBALAMIN 1000 MCG/ML IJ SOLN
1000.0000 ug | Freq: Once | INTRAMUSCULAR | Status: AC
  Administered 2012-08-19: 1000 ug via INTRAMUSCULAR

## 2012-08-19 NOTE — Assessment & Plan Note (Signed)
Change to po replacement

## 2012-08-19 NOTE — Assessment & Plan Note (Signed)
Previously contreolled, continue same meds

## 2012-08-19 NOTE — Assessment & Plan Note (Signed)
Home bps 130/70 Will continue same meds

## 2012-08-19 NOTE — Progress Notes (Signed)
Patient ID: Scott Vasquez, male   DOB: [birth year], 72 y.o.   MRN: [id number]  patient comes in for followup of multiple medical problems including type 2 diabetes, hyperlipidemia, hypertension. The patient does not check blood sugar or blood pressure at home. The patetient does not follow an exercise or diet program. The patient denies any polyuria, polydipsia.  In the past the patient has gone to diabetic treatment center. The patient is tolerating medications  Without difficulty. The patient does admit to medication compliance.   b12 deficiency-- will change to po today  Reviewed pmh, psh, soc hx   patient denies chest pain, shortness of breath, orthopnea. Denies lower extremity edema, abdominal pain, change in appetite, change in bowel movements. Patient denies rashes, musculoskeletal complaints. No other specific complaints in a complete review of systems.    well-developed well-nourished male in no acute distress. HEENT exam atraumatic, normocephalic, neck supple without jugular venous distention. Chest clear to auscultation cardiac exam S1-S2 are regular. Abdominal exam overweight with bowel sounds, soft and nontender. Extremities no edema. Neurologic exam is alert with a normal gait.

## 2012-08-19 NOTE — Assessment & Plan Note (Signed)
Needs f/u check labs today

## 2012-10-18 ENCOUNTER — Other Ambulatory Visit: Payer: Self-pay | Admitting: Internal Medicine

## 2012-12-02 LAB — HM DIABETES EYE EXAM

## 2012-12-10 ENCOUNTER — Other Ambulatory Visit: Payer: Self-pay | Admitting: Internal Medicine

## 2012-12-26 ENCOUNTER — Encounter: Payer: Self-pay | Admitting: Internal Medicine

## 2013-02-09 ENCOUNTER — Other Ambulatory Visit: Payer: Self-pay | Admitting: Internal Medicine

## 2013-02-17 ENCOUNTER — Ambulatory Visit: Payer: BC Managed Care – PPO | Admitting: Internal Medicine

## 2013-03-10 ENCOUNTER — Ambulatory Visit (INDEPENDENT_AMBULATORY_CARE_PROVIDER_SITE_OTHER): Payer: BC Managed Care – PPO | Admitting: Internal Medicine

## 2013-03-10 ENCOUNTER — Encounter: Payer: Self-pay | Admitting: Internal Medicine

## 2013-03-10 VITALS — BP 190/98 | HR 80 | Temp 98.0°F | Wt 202.0 lb

## 2013-03-10 DIAGNOSIS — E119 Type 2 diabetes mellitus without complications: Secondary | ICD-10-CM

## 2013-03-10 DIAGNOSIS — E1159 Type 2 diabetes mellitus with other circulatory complications: Secondary | ICD-10-CM

## 2013-03-10 DIAGNOSIS — I10 Essential (primary) hypertension: Secondary | ICD-10-CM

## 2013-03-10 DIAGNOSIS — E785 Hyperlipidemia, unspecified: Secondary | ICD-10-CM

## 2013-03-10 DIAGNOSIS — D509 Iron deficiency anemia, unspecified: Secondary | ICD-10-CM

## 2013-03-10 LAB — BASIC METABOLIC PANEL
Calcium: 10.1 mg/dL (ref 8.4–10.5)
Creatinine, Ser: 1.2 mg/dL (ref 0.4–1.5)
GFR: 77.64 mL/min (ref >60.00)

## 2013-03-10 LAB — MICROALBUMIN / CREATININE URINE RATIO
Creatinine,U: 105.9 mg/dL
Microalb Creat Ratio: 1.2 mg/g (ref 0.0–30.0)

## 2013-03-10 LAB — HEPATIC FUNCTION PANEL
Albumin: 4.1 g/dL (ref 3.5–5.2)
Alkaline Phosphatase: 112 U/L (ref 39–117)
Total Protein: 7.9 g/dL (ref 6.0–8.3)

## 2013-03-10 LAB — CBC
RBC: 4.17 Mil/uL — ABNORMAL LOW (ref 4.22–5.81)
WBC: 5.9 10*3/uL (ref 4.5–10.5)

## 2013-03-10 LAB — LIPID PANEL
Cholesterol: 112 mg/dL (ref 0–200)
HDL: 52.7 mg/dL (ref >39.00)
LDL Cholesterol: 50 mg/dL (ref 0–99)
Triglycerides: 46 mg/dL (ref 0.0–149.0)
VLDL: 9.2 mg/dL (ref 0.0–40.0)

## 2013-03-10 MED ORDER — AMLODIPINE BESYLATE 5 MG PO TABS: 5.0000 mg | ORAL_TABLET | Freq: Every day | ORAL | Status: DC

## 2013-03-10 MED ORDER — TRAMADOL HCL 50 MG PO TABS: 50.0000 mg | ORAL_TABLET | Freq: Three times a day (TID) | ORAL | Status: DC | PRN

## 2013-03-10 MED ORDER — CYANOCOBALAMIN 1000 MCG/ML IJ SOLN
1000.0000 ug | Freq: Once | INTRAMUSCULAR | Status: AC
  Administered 2013-03-10: 1000 ug via INTRAMUSCULAR

## 2013-03-10 NOTE — Assessment & Plan Note (Signed)
Adequate control. Continue same medications.

## 2013-03-10 NOTE — Progress Notes (Signed)
patient comes in for followup of multiple medical problems including type 2 diabetes, hyperlipidemia, hypertension. The patient does check blood sugar and blood pressure at home. bp running 160+/90+. The patetient does not follow an exercise or diet program. The patient denies any polyuria, polydipsia.  In the past the patient has gone to diabetic treatment center. The patient is tolerating medications  Without difficulty. The patient does admit to medication compliance.   Reviewed past medical history, family history, social history, medications. Reviewed health maintenance topics.  Review of systems: Patient denies chest pain, shortness of breath, PND or any other specific complaints in a complete review of systems.  Physical exam: Review vital signs. Chest her auscultation. Cardiac exam S1-S2 are regular. Abdominal exam active bowel sounds, soft her extremities no edema. Neurologic exam he is alert with a normal gait.

## 2013-03-10 NOTE — Assessment & Plan Note (Signed)
Lab Results  Component Value Date   HGB 11.8* 08/19/2012   We'll continue to follow.

## 2013-03-10 NOTE — Assessment & Plan Note (Signed)
Previously well controlled. Will continue same medications.

## 2013-03-10 NOTE — Assessment & Plan Note (Signed)
Needs followup laboratory work. We will do that today. He's not having any trouble to medications at this time.

## 2013-03-24 ENCOUNTER — Ambulatory Visit (INDEPENDENT_AMBULATORY_CARE_PROVIDER_SITE_OTHER): Payer: BC Managed Care – PPO | Admitting: Internal Medicine

## 2013-03-24 ENCOUNTER — Encounter: Payer: Self-pay | Admitting: Internal Medicine

## 2013-03-24 VITALS — BP 142/80

## 2013-03-24 DIAGNOSIS — I10 Essential (primary) hypertension: Secondary | ICD-10-CM

## 2013-03-24 NOTE — Progress Notes (Signed)
Patient comes in today for a nurse visit BP check.  He checks his BP at home and he gets around 173/71.  Per Dr Cato Mulligan no need to change any meds right now.  He doesn't need to check home BP anymore.  Pt verbalized understanding and had no questions

## 2013-03-29 ENCOUNTER — Other Ambulatory Visit: Payer: Self-pay | Admitting: Internal Medicine

## 2013-04-11 ENCOUNTER — Other Ambulatory Visit: Payer: Self-pay | Admitting: Internal Medicine

## 2013-04-26 ENCOUNTER — Other Ambulatory Visit: Payer: Self-pay | Admitting: Internal Medicine

## 2013-05-26 ENCOUNTER — Ambulatory Visit: Payer: BC Managed Care – PPO

## 2013-05-26 ENCOUNTER — Ambulatory Visit (INDEPENDENT_AMBULATORY_CARE_PROVIDER_SITE_OTHER): Payer: BC Managed Care – PPO | Admitting: Internal Medicine

## 2013-05-26 DIAGNOSIS — Z23 Encounter for immunization: Secondary | ICD-10-CM

## 2013-05-26 DIAGNOSIS — E538 Deficiency of other specified B group vitamins: Secondary | ICD-10-CM

## 2013-05-26 MED ORDER — CYANOCOBALAMIN 1000 MCG/ML IJ SOLN
1000.0000 ug | Freq: Once | INTRAMUSCULAR | Status: AC
  Administered 2013-05-26: 1000 ug via INTRAMUSCULAR

## 2013-06-15 ENCOUNTER — Other Ambulatory Visit: Payer: Self-pay | Admitting: Internal Medicine

## 2013-07-17 ENCOUNTER — Ambulatory Visit: Payer: BC Managed Care – PPO | Admitting: Internal Medicine

## 2013-09-08 ENCOUNTER — Ambulatory Visit: Payer: BC Managed Care – PPO | Admitting: Internal Medicine

## 2013-09-11 ENCOUNTER — Ambulatory Visit: Payer: BC Managed Care – PPO | Admitting: Internal Medicine

## 2013-09-15 ENCOUNTER — Ambulatory Visit: Payer: BC Managed Care – PPO | Admitting: Internal Medicine

## 2013-09-15 ENCOUNTER — Encounter: Payer: Self-pay | Admitting: *Deleted

## 2013-09-18 ENCOUNTER — Ambulatory Visit (INDEPENDENT_AMBULATORY_CARE_PROVIDER_SITE_OTHER): Payer: Medicare Other | Admitting: Internal Medicine

## 2013-09-18 ENCOUNTER — Encounter: Payer: Self-pay | Admitting: Internal Medicine

## 2013-09-18 VITALS — BP 124/68 | HR 72 | Temp 98.7°F | Ht 70.5 in | Wt 204.0 lb

## 2013-09-18 DIAGNOSIS — E785 Hyperlipidemia, unspecified: Secondary | ICD-10-CM

## 2013-09-18 DIAGNOSIS — I10 Essential (primary) hypertension: Secondary | ICD-10-CM

## 2013-09-18 DIAGNOSIS — D509 Iron deficiency anemia, unspecified: Secondary | ICD-10-CM

## 2013-09-18 DIAGNOSIS — E538 Deficiency of other specified B group vitamins: Secondary | ICD-10-CM

## 2013-09-18 DIAGNOSIS — E1159 Type 2 diabetes mellitus with other circulatory complications: Secondary | ICD-10-CM

## 2013-09-18 DIAGNOSIS — E119 Type 2 diabetes mellitus without complications: Secondary | ICD-10-CM

## 2013-09-18 LAB — CBC WITH DIFFERENTIAL/PLATELET
Basophils Absolute: 0 10*3/uL (ref 0.0–0.1)
Basophils Relative: 0.3 % (ref 0.0–3.0)
Eosinophils Absolute: 0.3 10*3/uL (ref 0.0–0.7)
Eosinophils Relative: 4.3 % (ref 0.0–5.0)
HCT: 37.5 % — ABNORMAL LOW (ref 39.0–52.0)
Hemoglobin: 12.1 g/dL — ABNORMAL LOW (ref 13.0–17.0)
LYMPHS PCT: 20.9 % (ref 12.0–46.0)
Lymphs Abs: 1.3 10*3/uL (ref 0.7–4.0)
MCHC: 32.2 g/dL (ref 30.0–36.0)
MCV: 88.7 fl (ref 78.0–100.0)
MONO ABS: 0.5 10*3/uL (ref 0.1–1.0)
Monocytes Relative: 7.8 % (ref 3.0–12.0)
Neutro Abs: 4 10*3/uL (ref 1.4–7.7)
Neutrophils Relative %: 66.7 % (ref 43.0–77.0)
PLATELETS: 269 10*3/uL (ref 150.0–400.0)
RBC: 4.22 Mil/uL (ref 4.22–5.81)
RDW: 13.4 % (ref 11.5–14.6)
WBC: 6 10*3/uL (ref 4.5–10.5)

## 2013-09-18 LAB — LIPID PANEL
Cholesterol: 113 mg/dL (ref 0–200)
HDL: 50.5 mg/dL (ref >39.00)
LDL Cholesterol: 52 mg/dL (ref 0–99)
TRIGLYCERIDES: 52 mg/dL (ref 0.0–149.0)
Total CHOL/HDL Ratio: 2
VLDL: 10.4 mg/dL (ref 0.0–40.0)

## 2013-09-18 LAB — BASIC METABOLIC PANEL
BUN: 15 mg/dL (ref 6–23)
CALCIUM: 9.6 mg/dL (ref 8.4–10.5)
CO2: 26 meq/L (ref 19–32)
Chloride: 104 mEq/L (ref 96–112)
Creatinine, Ser: 1.2 mg/dL (ref 0.4–1.5)
GFR: 79.08 mL/min (ref >60.00)
Glucose, Bld: 117 mg/dL — ABNORMAL HIGH (ref 70–99)
Potassium: 4.2 mEq/L (ref 3.5–5.1)
Sodium: 138 mEq/L (ref 135–145)

## 2013-09-18 LAB — MICROALBUMIN / CREATININE URINE RATIO
Creatinine,U: 163.4 mg/dL
Microalb Creat Ratio: 0.8 mg/g (ref 0.0–30.0)
Microalb, Ur: 1.3 mg/dL (ref 0.0–1.9)

## 2013-09-18 LAB — HEMOGLOBIN A1C: HEMOGLOBIN A1C: 7.8 % — AB (ref 4.6–6.5)

## 2013-09-18 LAB — HEPATIC FUNCTION PANEL
ALBUMIN: 3.9 g/dL (ref 3.5–5.2)
ALT: 17 U/L (ref 0–53)
AST: 19 U/L (ref 0–37)
Alkaline Phosphatase: 100 U/L (ref 39–117)
Bilirubin, Direct: 0 mg/dL (ref 0.0–0.3)
TOTAL PROTEIN: 7.7 g/dL (ref 6.0–8.3)
Total Bilirubin: 0.5 mg/dL (ref 0.3–1.2)

## 2013-09-18 LAB — VITAMIN B12: Vitamin B-12: 1184 pg/mL — ABNORMAL HIGH (ref 211–911)

## 2013-09-18 MED ORDER — TRAMADOL HCL 50 MG PO TABS: 50.0000 mg | ORAL_TABLET | Freq: Three times a day (TID) | ORAL | Status: DC | PRN

## 2013-09-18 MED ORDER — SITAGLIPTIN PHOS-METFORMIN HCL 50-1000 MG PO TABS: ORAL_TABLET | ORAL | Status: DC

## 2013-09-18 NOTE — Progress Notes (Signed)
Pre visit review using our clinic review tool, if applicable. No additional management support is needed unless otherwise documented below in the visit note. 

## 2013-09-18 NOTE — Progress Notes (Signed)
htn- home bps- 120s/70s  Dm- needs f/u labs.   Lipids- tolerating meds  Has OA of hands- needs   reiewed pmh psh, sochx, meds  Ros: no complaints  BP 124/68  Pulse 72  Temp(Src) 98.7 F (37.1 C) (Oral)  Ht 5' 10.5" (1.791 m)  Wt 204 lb (92.534 kg)  BMI 28.85 kg/m2  well-developed well-nourished male in no acute distress. HEENT exam atraumatic, normocephalic, neck supple without jugular venous distention. Chest clear to auscultation cardiac exam S1-S2 are regular. Abdominal exam overweight with bowel sounds, soft and nontender. Extremities no edema. Neurologic exam is alert with a normal gait.

## 2013-09-19 ENCOUNTER — Telehealth: Payer: Self-pay | Admitting: Internal Medicine

## 2013-09-19 NOTE — Telephone Encounter (Signed)
Relevant patient education mailed to patient.  

## 2013-09-20 NOTE — Assessment & Plan Note (Signed)
Will follow.

## 2013-09-20 NOTE — Assessment & Plan Note (Signed)
Lab Results  Component Value Date   HGBA1C 7.8* 09/18/2013   Discussed need for aggressive weight loss.

## 2013-09-20 NOTE — Assessment & Plan Note (Signed)
Previously controlled

## 2013-12-01 ENCOUNTER — Other Ambulatory Visit: Payer: Self-pay | Admitting: Internal Medicine

## 2013-12-08 LAB — HM DIABETES EYE EXAM

## 2013-12-15 ENCOUNTER — Encounter: Payer: Self-pay | Admitting: Internal Medicine

## 2014-01-05 ENCOUNTER — Other Ambulatory Visit: Payer: Self-pay | Admitting: Internal Medicine

## 2014-03-23 ENCOUNTER — Ambulatory Visit: Payer: Medicare Other | Admitting: Internal Medicine

## 2014-03-23 ENCOUNTER — Encounter: Payer: Self-pay | Admitting: Family

## 2014-03-23 ENCOUNTER — Ambulatory Visit (INDEPENDENT_AMBULATORY_CARE_PROVIDER_SITE_OTHER): Payer: Medicare Other | Admitting: Family

## 2014-03-23 VITALS — BP 140/82 | HR 72 | Temp 98.3°F | Ht 70.5 in | Wt 200.0 lb

## 2014-03-23 DIAGNOSIS — M19049 Primary osteoarthritis, unspecified hand: Secondary | ICD-10-CM

## 2014-03-23 DIAGNOSIS — E7800 Pure hypercholesterolemia, unspecified: Secondary | ICD-10-CM

## 2014-03-23 DIAGNOSIS — M19042 Primary osteoarthritis, left hand: Secondary | ICD-10-CM

## 2014-03-23 DIAGNOSIS — IMO0001 Reserved for inherently not codable concepts without codable children: Secondary | ICD-10-CM

## 2014-03-23 DIAGNOSIS — E1165 Type 2 diabetes mellitus with hyperglycemia: Principal | ICD-10-CM

## 2014-03-23 DIAGNOSIS — E538 Deficiency of other specified B group vitamins: Secondary | ICD-10-CM

## 2014-03-23 DIAGNOSIS — I10 Essential (primary) hypertension: Secondary | ICD-10-CM

## 2014-03-23 LAB — LIPID PANEL
Cholesterol: 114 mg/dL (ref 0–200)
HDL: 42.6 mg/dL (ref >39.00)
LDL CALC: 64 mg/dL (ref 0–99)
NonHDL: 71.4
TRIGLYCERIDES: 39 mg/dL (ref 0.0–149.0)
Total CHOL/HDL Ratio: 3
VLDL: 7.8 mg/dL (ref 0.0–40.0)

## 2014-03-23 LAB — BASIC METABOLIC PANEL
BUN: 16 mg/dL (ref 6–23)
CALCIUM: 9.6 mg/dL (ref 8.4–10.5)
CO2: 27 mEq/L (ref 19–32)
CREATININE: 1.2 mg/dL (ref 0.4–1.5)
Chloride: 101 mEq/L (ref 96–112)
GFR: 74.5 mL/min (ref >60.00)
Glucose, Bld: 114 mg/dL — ABNORMAL HIGH (ref 70–99)
Potassium: 3.8 mEq/L (ref 3.5–5.1)
Sodium: 134 mEq/L — ABNORMAL LOW (ref 135–145)

## 2014-03-23 LAB — HEPATIC FUNCTION PANEL
ALT: 11 U/L (ref 0–53)
AST: 15 U/L (ref 0–37)
Albumin: 3.8 g/dL (ref 3.5–5.2)
Alkaline Phosphatase: 103 U/L (ref 39–117)
Bilirubin, Direct: 0 mg/dL (ref 0.0–0.3)
Total Bilirubin: 0.8 mg/dL (ref 0.2–1.2)
Total Protein: 7.3 g/dL (ref 6.0–8.3)

## 2014-03-23 LAB — VITAMIN B12: Vitamin B-12: 818 pg/mL (ref 211–911)

## 2014-03-23 LAB — MICROALBUMIN / CREATININE URINE RATIO
CREATININE, U: 63.9 mg/dL
Microalb Creat Ratio: 0.5 mg/g (ref 0.0–30.0)
Microalb, Ur: 0.3 mg/dL (ref 0.0–1.9)

## 2014-03-23 LAB — HEMOGLOBIN A1C: HEMOGLOBIN A1C: 7.7 % — AB (ref 4.6–6.5)

## 2014-03-23 MED ORDER — MELOXICAM 7.5 MG PO TABS: 7.5000 mg | ORAL_TABLET | Freq: Every day | ORAL | Status: DC

## 2014-03-23 MED ORDER — GLIPIZIDE-METFORMIN HCL 5-500 MG PO TABS: 2.0000 | ORAL_TABLET | Freq: Two times a day (BID) | ORAL | Status: DC

## 2014-03-23 NOTE — Progress Notes (Signed)
Pre visit review using our clinic review tool, if applicable. No additional management support is needed unless otherwise documented below in the visit note. 

## 2014-03-23 NOTE — Patient Instructions (Signed)
Diabetes and Standards of Medical Care Diabetes is complicated. You may find that your diabetes team includes a dietitian, nurse, diabetes educator, eye doctor, and more. To help everyone know what is going on and to help you get the care you deserve, the following schedule of care was developed to help keep you on track. Below are the tests, exams, vaccines, medicines, education, and plans you will need. HbA1c test This test shows how well you have controlled your glucose over the past 2-3 months. It is used to see if your diabetes management plan needs to be adjusted.   It is performed at least 2 times a year if you are meeting treatment goals.  It is performed 4 times a year if therapy has changed or if you are not meeting treatment goals. Blood pressure test  This test is performed at every routine medical visit. The goal is less than 140/90 mm Hg for most people, but 130/80 mm Hg in some cases. Ask your health care provider about your goal. Dental exam  Follow up with the dentist regularly. Eye exam  If you are diagnosed with type 1 diabetes as a child, get an exam upon reaching the age of 37 years or older and have had diabetes for 3-5 years. Yearly eye exams are recommended after that initial eye exam.  If you are diagnosed with type 1 diabetes as an adult, get an exam within 5 years of diagnosis and then yearly.  If you are diagnosed with type 2 diabetes, get an exam as soon as possible after the diagnosis and then yearly. Foot care exam  Visual foot exams are performed at every routine medical visit. The exams check for cuts, injuries, or other problems with the feet.  A comprehensive foot exam should be done yearly. This includes visual inspection as well as assessing foot pulses and testing for loss of sensation.  Check your feet nightly for cuts, injuries, or other problems with your feet. Tell your health care provider if anything is not healing. Kidney function test (urine  microalbumin)  This test is performed once a year.  Type 1 diabetes: The first test is performed 5 years after diagnosis.  Type 2 diabetes: The first test is performed at the time of diagnosis.  A serum creatinine and estimated glomerular filtration rate (eGFR) test is done once a year to assess the level of chronic kidney disease (CKD), if present. Lipid profile (cholesterol, HDL, LDL, triglycerides)  Performed every 5 years for most people.  The goal for LDL is less than 100 mg/dL. If you are at high risk, the goal is less than 70 mg/dL.  The goal for HDL is 40 mg/dL-50 mg/dL for men and 50 mg/dL-60 mg/dL for women. An HDL cholesterol of 60 mg/dL or higher gives some protection against heart disease.  The goal for triglycerides is less than 150 mg/dL. Influenza vaccine, pneumococcal vaccine, and hepatitis B vaccine  The influenza vaccine is recommended yearly.  It is recommended that people with diabetes who are over 24 years old get the pneumonia vaccine. In some cases, two separate shots may be given. Ask your health care provider if your pneumonia vaccination is up to date.  The hepatitis B vaccine is also recommended for adults with diabetes. Diabetes self-management education  Education is recommended at diagnosis and ongoing as needed. Treatment plan  Your treatment plan is reviewed at every medical visit. Document Released: 05/24/2009 Document Revised: 12/11/2013 Document Reviewed: 12/27/2012 [facility] Patient Information 2015 [place],  [employer]. This information is not intended to replace advice given to you by your health care provider. Make sure you discuss any questions you have with your health care provider.  

## 2014-03-23 NOTE — Progress Notes (Signed)
Subjective:    Patient ID: Scott Vasquez, male    DOB: 06-Mar-1941, 73 y.o.   MRN: [id number]  HPI 73 year old African American male, nonsmoker with a history of type 2 diabetes, hypertension, hyperlipidemia, in today for a recheck. Reports Januvia and Lipitor being expensive costing more than $70 apiece. Would like to consider switching the medication. Reports blood glucose fasting 110 or less.    Review of Systems  Constitutional: Negative.   HENT: Negative.   Eyes: Negative.   Respiratory: Negative.   Cardiovascular: Negative.   Gastrointestinal: Negative.   Endocrine: Negative.   Genitourinary: Negative.   Musculoskeletal: Negative.   Skin: Negative.   Allergic/Immunologic: Negative.   Neurological: Negative.   Hematological: Negative.   Psychiatric/Behavioral: Negative.    Past Medical History  Diagnosis Date  . Anemia     iron deficiency  . Diabetes mellitus     type II  . Hypertension   . Stroke   . MRSA (methicillin resistant Staphylococcus aureus)   . Diverticulosis of colon     History   Social History  . Marital Status: Married    Spouse Name: N/A    Number of Children: N/A  . Years of Education: N/A   Occupational History  . retired Paediatric nurse   Social History Main Topics  . Smoking status: Former Research scientist (life sciences)  . Smokeless tobacco: Not on file  . Alcohol Use: No  . Drug Use: Not on file  . Sexual Activity: Not on file   Other Topics Concern  . Not on file   Social History Narrative  . No narrative on file    Past Surgical History  Procedure Laterality Date  . Cataract extraction, bilateral    . Back surgery    . Shoulder surgery  08/2007    Supple    Family History  Problem Relation Age of Onset  . Diabetes      No Known Allergies  Current Outpatient Prescriptions on File Prior to Visit  Medication Sig Dispense Refill  . amLODipine (NORVASC) 5 MG tablet TAKE ONE TABLET BY MOUTH DAILY  90 tablet  1  . aspirin 81 MG tablet Take 81 mg by  mouth daily.        Marland Kitchen atorvastatin (LIPITOR) 40 MG tablet TAKE ONE TABLET BY MOUTH DAILY  30 tablet  5  . cyanocobalamin (,VITAMIN B-12,) 1000 MCG/ML injection Inject 1,000 mcg into the muscle every 30 (thirty) days.        Marland Kitchen HUMULIN 70/30 (70-30) 100 UNIT/ML injection INJECT TEN UNITS SUBCUTANEOUSLY TWICE DAILY. (BEFORE BREAKFAST AND DINNER)  10 mL  prn  . Insulin Syringe-Needle U-100 (INSULIN SYRINGE 1CC/31GX5/16") 31G X 5/16" 1 ML MISC Use two times a day as directed.       Marland Kitchen lisinopril (PRINIVIL,ZESTRIL) 40 MG tablet TAKE ONE TABLET ONCE DAILY  90 tablet  prn   No current facility-administered medications on file prior to visit.    BP 140/82  Pulse 72  Temp(Src) 98.3 F (36.8 C) (Oral)  Ht 5' 10.5" (1.791 m)  Wt 200 lb (90.719 kg)  BMI 28.28 kg/m2chart    Objective:   Physical Exam  Constitutional: He is oriented to person, place, and time. He appears well-developed and well-nourished.  HENT:  Right Ear: External ear normal.  Left Ear: External ear normal.  Nose: Nose normal.  Mouth/Throat: Oropharynx is clear and moist.  Neck: Normal range of motion. Neck supple.  Cardiovascular: Normal rate, regular rhythm and normal heart sounds.  Pulmonary/Chest: Effort normal and breath sounds normal.  Abdominal: Soft. Bowel sounds are normal.  Musculoskeletal: Normal range of motion.  Neurological: He is alert and oriented to person, place, and time.  Skin: Skin is warm and dry.  Psychiatric: He has a normal mood and affect.          Assessment & Plan:  Scott Vasquez was seen today for follow-up.  Diagnoses and associated orders for this visit:  Type II or unspecified type diabetes mellitus without mention of complication, uncontrolled - Hemoglobin A1c - Hepatic Function Panel - Microalbumin/Creatinine Ratio, Urine  Unspecified essential hypertension - Basic Metabolic Panel  V69 deficiency - Vitamin B12  Pure hypercholesterolemia - Basic Metabolic Panel - Hepatic Function  Panel - Lipid Panel  Primary osteoarthritis of left hand  Other Orders - meloxicam (MOBIC) 7.5 MG tablet; Take 1 tablet (7.5 mg total) by mouth daily. - glipiZIDE-metformin (METAGLIP) 5-500 MG per tablet; Take 2 tablets by mouth 2 (two) times daily before a meal.   Call the office with any questions or concerns. Stop Januvia. Start Glipizide. We will see how he tolerates Glipizide.  Consider switching Lipitor to Simvaststain at next OV.

## 2014-04-20 ENCOUNTER — Encounter: Payer: Self-pay | Admitting: Family

## 2014-04-20 ENCOUNTER — Ambulatory Visit (INDEPENDENT_AMBULATORY_CARE_PROVIDER_SITE_OTHER): Payer: Medicare Other | Admitting: Family

## 2014-04-20 VITALS — BP 140/82 | HR 77 | Ht 70.5 in | Wt 203.0 lb

## 2014-04-20 DIAGNOSIS — E7800 Pure hypercholesterolemia, unspecified: Secondary | ICD-10-CM

## 2014-04-20 DIAGNOSIS — IMO0001 Reserved for inherently not codable concepts without codable children: Secondary | ICD-10-CM

## 2014-04-20 DIAGNOSIS — I10 Essential (primary) hypertension: Secondary | ICD-10-CM

## 2014-04-20 DIAGNOSIS — Z23 Encounter for immunization: Secondary | ICD-10-CM

## 2014-04-20 DIAGNOSIS — E1165 Type 2 diabetes mellitus with hyperglycemia: Principal | ICD-10-CM

## 2014-04-20 NOTE — Progress Notes (Signed)
   Subjective:    Patient ID: Scott Vasquez, male    DOB: 1941-06-08, 73 y.o.   MRN: [id number]  HPI 73 year old African American male, nonsmoker with a history of type 2 diabetes, hypertension, hyperlipidemia is in today for a recheck of blood glucose due to a change from Januvia to glipizide and metformin combination due to cost. Medication is much more affordable. Blood glucoses are stable. Fasting blood sugar this morning 116. Blood pressure is stable. All other medications are cost effective.   Review of Systems  Constitutional: Negative.   HENT: Negative.   Respiratory: Negative.   Cardiovascular: Negative.   Gastrointestinal: Negative.   Endocrine: Negative.   Genitourinary: Negative.   Musculoskeletal: Negative.   Skin: Negative.   Allergic/Immunologic: Negative.   Neurological: Negative.   Hematological: Negative.   Psychiatric/Behavioral: Negative.        Objective:   Physical Exam  Constitutional: He is oriented to person, place, and time. He appears well-developed and well-nourished.  HENT:  Right Ear: External ear normal.  Left Ear: External ear normal.  Nose: Nose normal.  Mouth/Throat: Oropharynx is clear and moist.  Neck: Normal range of motion. Neck supple.  Cardiovascular: Normal rate, regular rhythm and normal heart sounds.   Pulmonary/Chest: Effort normal and breath sounds normal.  Abdominal: Soft. Bowel sounds are normal.  Musculoskeletal: Normal range of motion.  Neurological: He is alert and oriented to person, place, and time.  Skin: Skin is warm and dry.  Psychiatric: He has a normal mood and affect.          Assessment & Plan:  Scott Vasquez was seen today for follow-up.  Diagnoses and associated orders for this visit:  Type II or unspecified type diabetes mellitus without mention of complication, uncontrolled  Pure hypercholesterolemia  Unspecified essential hypertension  Need for prophylactic vaccination and inoculation against  influenza - Flu Vaccine QUAD 36+ mos PF IM (Fluarix Quad PF)    call the office with any questions or concerns. Recheck in 4 months and sooner as needed.

## 2014-04-20 NOTE — Progress Notes (Signed)
Pre visit review using our clinic review tool, if applicable. No additional management support is needed unless otherwise documented below in the visit note. 

## 2014-04-20 NOTE — Patient Instructions (Signed)
Diabetes and Exercise Exercising regularly is important. It is not just about losing weight. It has many health benefits, such as:  Improving your overall fitness, flexibility, and endurance.  Increasing your bone density.  Helping with weight control.  Decreasing your body fat.  Increasing your muscle strength.  Reducing stress and tension.  Improving your overall health. People with diabetes who exercise gain additional benefits because exercise:  Reduces appetite.  Improves the body's use of blood sugar (glucose).  Helps lower or control blood glucose.  Decreases blood pressure.  Helps control blood lipids (such as cholesterol and triglycerides).  Improves the body's use of the hormone insulin by:  Increasing the body's insulin sensitivity.  Reducing the body's insulin needs.  Decreases the risk for heart disease because exercising:  Lowers cholesterol and triglycerides levels.  Increases the levels of good cholesterol (such as high-density lipoproteins [HDL]) in the body.  Lowers blood glucose levels. YOUR ACTIVITY PLAN  Choose an activity that you enjoy and set realistic goals. Your health care provider or diabetes educator can help you make an activity plan that works for you. Exercise regularly as directed by your health care provider. This includes:  Performing resistance training twice a week such as push-ups, sit-ups, lifting weights, or using resistance bands.  Performing 150 minutes of cardio exercises each week such as walking, running, or playing sports.  Staying active and spending no more than 90 minutes at one time being inactive. Even short bursts of exercise are good for you. Three 10-minute sessions spread throughout the day are just as beneficial as a single 30-minute session. Some exercise ideas include:  Taking the dog for a walk.  Taking the stairs instead of the elevator.  Dancing to your favorite song.  Doing an exercise  video.  Doing your favorite exercise with a friend. RECOMMENDATIONS FOR EXERCISING WITH TYPE 1 OR TYPE 2 DIABETES   Check your blood glucose before exercising. If blood glucose levels are greater than 240 mg/dL, check for urine ketones. Do not exercise if ketones are present.  Avoid injecting insulin into areas of the body that are going to be exercised. For example, avoid injecting insulin into:  The arms when playing tennis.  The legs when jogging.  Keep a record of:  Food intake before and after you exercise.  Expected peak times of insulin action.  Blood glucose levels before and after you exercise.  The type and amount of exercise you have done.  Review your records with your health care provider. Your health care provider will help you to develop guidelines for adjusting food intake and insulin amounts before and after exercising.  If you take insulin or oral hypoglycemic agents, watch for signs and symptoms of hypoglycemia. They include:  Dizziness.  Shaking.  Sweating.  Chills.  Confusion.  Drink plenty of water while you exercise to prevent dehydration or heat stroke. Body water is lost during exercise and must be replaced.  Talk to your health care provider before starting an exercise program to make sure it is safe for you. Remember, almost any type of activity is better than none. Document Released: 10/17/2003 Document Revised: 12/11/2013 Document Reviewed: 01/03/2013 [employer] Patient Information 2015 [employer]. This information is not intended to replace advice given to you by your health care provider. Make sure you discuss any questions you have with your health care provider.  

## 2014-06-19 ENCOUNTER — Other Ambulatory Visit: Payer: Self-pay | Admitting: Family

## 2014-07-20 ENCOUNTER — Ambulatory Visit (INDEPENDENT_AMBULATORY_CARE_PROVIDER_SITE_OTHER): Payer: Medicare Other | Admitting: Family

## 2014-07-20 ENCOUNTER — Encounter: Payer: Self-pay | Admitting: Family

## 2014-07-20 VITALS — BP 138/70 | HR 59 | Ht 70.5 in | Wt 207.0 lb

## 2014-07-20 DIAGNOSIS — I10 Essential (primary) hypertension: Secondary | ICD-10-CM

## 2014-07-20 DIAGNOSIS — J069 Acute upper respiratory infection, unspecified: Secondary | ICD-10-CM

## 2014-07-20 DIAGNOSIS — R05 Cough: Secondary | ICD-10-CM

## 2014-07-20 DIAGNOSIS — E7800 Pure hypercholesterolemia, unspecified: Secondary | ICD-10-CM

## 2014-07-20 DIAGNOSIS — E1165 Type 2 diabetes mellitus with hyperglycemia: Secondary | ICD-10-CM

## 2014-07-20 DIAGNOSIS — IMO0002 Reserved for concepts with insufficient information to code with codable children: Secondary | ICD-10-CM

## 2014-07-20 DIAGNOSIS — R059 Cough, unspecified: Secondary | ICD-10-CM

## 2014-07-20 LAB — CBC WITH DIFFERENTIAL/PLATELET
BASOS PCT: 0.4 % (ref 0.0–3.0)
Basophils Absolute: 0 10*3/uL (ref 0.0–0.1)
EOS PCT: 5.5 % — AB (ref 0.0–5.0)
Eosinophils Absolute: 0.5 10*3/uL (ref 0.0–0.7)
HCT: 36.3 % — ABNORMAL LOW (ref 39.0–52.0)
Hemoglobin: 11.6 g/dL — ABNORMAL LOW (ref 13.0–17.0)
LYMPHS PCT: 14.4 % (ref 12.0–46.0)
Lymphs Abs: 1.3 10*3/uL (ref 0.7–4.0)
MCHC: 31.9 g/dL (ref 30.0–36.0)
MCV: 87.4 fl (ref 78.0–100.0)
Monocytes Absolute: 0.9 10*3/uL (ref 0.1–1.0)
Monocytes Relative: 10.4 % (ref 3.0–12.0)
NEUTROS PCT: 69.3 % (ref 43.0–77.0)
Neutro Abs: 6.3 10*3/uL (ref 1.4–7.7)
Platelets: 284 10*3/uL (ref 150.0–400.0)
RBC: 4.16 Mil/uL — AB (ref 4.22–5.81)
RDW: 13.6 % (ref 11.5–15.5)
WBC: 9.1 10*3/uL (ref 4.0–10.5)

## 2014-07-20 LAB — HEPATIC FUNCTION PANEL
ALBUMIN: 3.9 g/dL (ref 3.5–5.2)
ALT: 16 U/L (ref 0–53)
AST: 22 U/L (ref 0–37)
Alkaline Phosphatase: 108 U/L (ref 39–117)
Bilirubin, Direct: 0 mg/dL (ref 0.0–0.3)
Total Bilirubin: 0.5 mg/dL (ref 0.2–1.2)
Total Protein: 7.7 g/dL (ref 6.0–8.3)

## 2014-07-20 LAB — LIPID PANEL
Cholesterol: 121 mg/dL (ref 0–200)
HDL: 49.5 mg/dL (ref >39.00)
LDL CALC: 60 mg/dL (ref 0–99)
NonHDL: 71.5
TRIGLYCERIDES: 59 mg/dL (ref 0.0–149.0)
Total CHOL/HDL Ratio: 2
VLDL: 11.8 mg/dL (ref 0.0–40.0)

## 2014-07-20 LAB — BASIC METABOLIC PANEL
BUN: 18 mg/dL (ref 6–23)
CHLORIDE: 102 meq/L (ref 96–112)
CO2: 25 mEq/L (ref 19–32)
Calcium: 9.6 mg/dL (ref 8.4–10.5)
Creatinine, Ser: 1.2 mg/dL (ref 0.4–1.5)
GFR: 79.69 mL/min (ref >60.00)
GLUCOSE: 112 mg/dL — AB (ref 70–99)
POTASSIUM: 3.7 meq/L (ref 3.5–5.1)
SODIUM: 133 meq/L — AB (ref 135–145)

## 2014-07-20 LAB — HEMOGLOBIN A1C: Hgb A1c MFr Bld: 8.8 % — ABNORMAL HIGH (ref 4.6–6.5)

## 2014-07-20 MED ORDER — BENZONATATE 200 MG PO CAPS: 200.0000 mg | ORAL_CAPSULE | Freq: Two times a day (BID) | ORAL | Status: DC | PRN

## 2014-07-20 NOTE — Progress Notes (Signed)
Pre visit review using our clinic review tool, if applicable. No additional management support is needed unless otherwise documented below in the visit note. 

## 2014-07-20 NOTE — Progress Notes (Signed)
Subjective:    Patient ID: Scott Vasquez, male    DOB: 07-Jan-1941, 73 y.o.   MRN: [id number]  HPI  73 year old African-American male, died smoker with a history of type 2 diabetes, hypercholesterolemia, hypertension is in today for recheck. Reports he is doing well. Tolerates medications well. Has concerns of a cough ongoing 3 days with congestion. Hasn't taken over-the-counter cough syrup that he feels has helped some feels better today. Denies any fever or chills.  Review of Systems  Constitutional: Negative.   HENT: Positive for congestion and postnasal drip. Negative for sinus pressure and sore throat.   Respiratory: Positive for cough. Negative for shortness of breath.   Cardiovascular: Negative.   Gastrointestinal: Negative.   Endocrine: Negative.   Genitourinary: Negative.   Musculoskeletal: Negative.   Skin: Negative.   Allergic/Immunologic: Negative.   Neurological: Negative.   Hematological: Negative.   Psychiatric/Behavioral: Negative.    Past Medical History  Diagnosis Date  . Anemia     iron deficiency  . Diabetes mellitus     type II  . Hypertension   . Stroke   . MRSA (methicillin resistant Staphylococcus aureus)   . Diverticulosis of colon     History   Social History  . Marital Status: Married    Spouse Name: N/A    Number of Children: N/A  . Years of Education: N/A   Occupational History  . retired Paediatric nurse   Social History Main Topics  . Smoking status: Former Research scientist (life sciences)  . Smokeless tobacco: Not on file  . Alcohol Use: No  . Drug Use: Not on file  . Sexual Activity: Not on file   Other Topics Concern  . Not on file   Social History Narrative    Past Surgical History  Procedure Laterality Date  . Cataract extraction, bilateral    . Back surgery    . Shoulder surgery  08/2007    Supple    Family History  Problem Relation Age of Onset  . Diabetes      No Known Allergies  Current Outpatient Prescriptions on File Prior to Visit    Medication Sig Dispense Refill  . amLODipine (NORVASC) 5 MG tablet TAKE ONE TABLET BY MOUTH DAILY 90 tablet 1  . aspirin 81 MG tablet Take 81 mg by mouth daily.      Marland Kitchen atorvastatin (LIPITOR) 40 MG tablet TAKE ONE TABLET BY MOUTH DAILY 30 tablet 5  . cyanocobalamin (,VITAMIN B-12,) 1000 MCG/ML injection Inject 1,000 mcg into the muscle every 30 (thirty) days.      Marland Kitchen glipiZIDE-metformin (METAGLIP) 5-500 MG per tablet Take 2 tablets by mouth 2 (two) times daily before a meal. 120 tablet 3  . HUMULIN 70/30 (70-30) 100 UNIT/ML injection INJECT TEN UNITS SUBCUTANEOUSLY TWICE DAILY. (BEFORE BREAKFAST AND DINNER) 10 mL prn  . Insulin Syringe-Needle U-100 (INSULIN SYRINGE 1CC/31GX5/16") 31G X 5/16" 1 ML MISC Use two times a day as directed.     Marland Kitchen lisinopril (PRINIVIL,ZESTRIL) 40 MG tablet TAKE ONE TABLET ONCE DAILY 90 tablet prn  . meloxicam (MOBIC) 7.5 MG tablet TAKE ONE TABLET BY MOUTH ONCE DAILY 30 tablet 2   No current facility-administered medications on file prior to visit.    BP 138/70 mmHg  Pulse 59  Ht 5' 10.5" (1.791 m)  Wt 207 lb (93.895 kg)  BMI 29.27 kg/m2chart    Objective:   Physical Exam  Constitutional: He is oriented to person, place, and time. He appears well-developed and well-nourished.  HENT:  Right Ear: External ear normal.  Left Ear: External ear normal.  Nose: Nose normal.  Mouth/Throat: Oropharynx is clear and moist.  Eyes: Pupils are equal, round, and reactive to light.  Neck: Normal range of motion. Neck supple.  Cardiovascular: Normal rate, regular rhythm and normal heart sounds.   Pulmonary/Chest: Effort normal and breath sounds normal.  Abdominal: Soft. Bowel sounds are normal.  Musculoskeletal: Normal range of motion.  Neurological: He is alert and oriented to person, place, and time.  Skin: Skin is warm and dry.  Psychiatric: He has a normal mood and affect.          Assessment & Plan:  Jedaiah was seen today for follow-up and cough.  Diagnoses and  associated orders for this visit:  Essential hypertension - Basic Metabolic Panel - CBC with Differential  Type 2 diabetes mellitus, uncontrolled - Basic Metabolic Panel - CBC with Differential - Hemoglobin A1c  Pure hypercholesterolemia - Hepatic Function Panel - CBC with Differential - Lipid Panel  Upper respiratory infection - CBC with Differential  Cough - CBC with Differential  Other Orders - benzonatate (TESSALON) 200 MG capsule; Take 1 capsule (200 mg total) by mouth 2 (two) times daily as needed for cough.    Encouraged healthy diet, exercise. Tessalon Perles as needed for cough. Drink plenty of fluids. Consider chest x-ray if cough persists. Recheck in 4 months and sooner as needed.

## 2014-07-20 NOTE — Patient Instructions (Signed)
Upper Respiratory Infection, Adult An upper respiratory infection (URI) is also sometimes known as the common cold. The upper respiratory tract includes the nose, sinuses, throat, trachea, and bronchi. Bronchi are the airways leading to the lungs. Most people improve within 1 week, but symptoms can last up to 2 weeks. A residual cough may last even longer.  CAUSES Many different viruses can infect the tissues lining the upper respiratory tract. The tissues become irritated and inflamed and often become very moist. Mucus production is also common. A cold is contagious. You can easily spread the virus to others by oral contact. This includes kissing, sharing a glass, coughing, or sneezing. Touching your mouth or nose and then touching a surface, which is then touched by another person, can also spread the virus. SYMPTOMS  Symptoms typically develop 1 to 3 days after you come in contact with a cold virus. Symptoms vary from person to person. They may include:  Runny nose.  Sneezing.  Nasal congestion.  Sinus irritation.  Sore throat.  Loss of voice (laryngitis).  Cough.  Fatigue.  Muscle aches.  Loss of appetite.  Headache.  Low-grade fever. DIAGNOSIS  You might diagnose your own cold based on familiar symptoms, since most people get a cold 2 to 3 times a year. Your caregiver can confirm this based on your exam. Most importantly, your caregiver can check that your symptoms are not due to another disease such as strep throat, sinusitis, pneumonia, asthma, or epiglottitis. Blood tests, throat tests, and X-rays are not necessary to diagnose a common cold, but they may sometimes be helpful in excluding other more serious diseases. Your caregiver will decide if any further tests are required. RISKS AND COMPLICATIONS  You may be at risk for a more severe case of the common cold if you smoke cigarettes, have chronic heart disease (such as heart failure) or lung disease (such as asthma), or if  you have a weakened immune system. The very young and very old are also at risk for more serious infections. Bacterial sinusitis, middle ear infections, and bacterial pneumonia can complicate the common cold. The common cold can worsen asthma and chronic obstructive pulmonary disease (COPD). Sometimes, these complications can require emergency medical care and may be life-threatening. PREVENTION  The best way to protect against getting a cold is to practice good hygiene. Avoid oral or hand contact with people with cold symptoms. Wash your hands often if contact occurs. There is no clear evidence that vitamin C, vitamin E, echinacea, or exercise reduces the chance of developing a cold. However, it is always recommended to get plenty of rest and practice good nutrition. TREATMENT  Treatment is directed at relieving symptoms. There is no cure. Antibiotics are not effective, because the infection is caused by a virus, not by bacteria. Treatment may include:  Increased fluid intake. Sports drinks offer valuable electrolytes, sugars, and fluids.  Breathing heated mist or steam (vaporizer or shower).  Eating chicken soup or other clear broths, and maintaining good nutrition.  Getting plenty of rest.  Using gargles or lozenges for comfort.  Controlling fevers with ibuprofen or acetaminophen as directed by your caregiver.  Increasing usage of your inhaler if you have asthma. Zinc gel and zinc lozenges, taken in the first 24 hours of the common cold, can shorten the duration and lessen the severity of symptoms. Pain medicines may help with fever, muscle aches, and throat pain. A variety of non-prescription medicines are available to treat congestion and runny nose. Your caregiver   can make recommendations and may suggest nasal or lung inhalers for other symptoms.  HOME CARE INSTRUCTIONS   Only take over-the-counter or prescription medicines for pain, discomfort, or fever as directed by your  caregiver.  Use a warm mist humidifier or inhale steam from a shower to increase air moisture. This may keep secretions moist and make it easier to breathe.  Drink enough water and fluids to keep your urine clear or pale yellow.  Rest as needed.  Return to work when your temperature has returned to normal or as your caregiver advises. You may need to stay home longer to avoid infecting others. You can also use a face mask and careful hand washing to prevent spread of the virus. SEEK MEDICAL CARE IF:   After the first few days, you feel you are getting worse rather than better.  You need your caregiver's advice about medicines to control symptoms.  You develop chills, worsening shortness of breath, or brown or red sputum. These may be signs of pneumonia.  You develop yellow or brown nasal discharge or pain in the face, especially when you bend forward. These may be signs of sinusitis.  You develop a fever, swollen neck glands, pain with swallowing, or white areas in the back of your throat. These may be signs of strep throat. SEEK IMMEDIATE MEDICAL CARE IF:   You have a fever.  You develop severe or persistent headache, ear pain, sinus pain, or chest pain.  You develop wheezing, a prolonged cough, cough up blood, or have a change in your usual mucus (if you have chronic lung disease).  You develop sore muscles or a stiff neck. Document Released: 01/20/2001 Document Revised: 10/19/2011 Document Reviewed: 11/01/2013 [employer] Patient Information 2015 [employer]. This information is not intended to replace advice given to you by your health care provider. Make sure you discuss any questions you have with your health care provider.  

## 2014-07-21 ENCOUNTER — Other Ambulatory Visit: Payer: Self-pay | Admitting: Family

## 2014-07-23 ENCOUNTER — Other Ambulatory Visit: Payer: Self-pay | Admitting: Family

## 2014-07-23 MED ORDER — SITAGLIPTIN PHOSPHATE 100 MG PO TABS: 100.0000 mg | ORAL_TABLET | Freq: Every day | ORAL | Status: DC

## 2014-07-30 ENCOUNTER — Other Ambulatory Visit: Payer: Self-pay | Admitting: Internal Medicine

## 2014-09-05 ENCOUNTER — Other Ambulatory Visit: Payer: Self-pay | Admitting: *Deleted

## 2014-09-05 MED ORDER — AMLODIPINE BESYLATE 5 MG PO TABS: 5.0000 mg | ORAL_TABLET | Freq: Every day | ORAL | Status: DC

## 2014-09-11 ENCOUNTER — Other Ambulatory Visit: Payer: Self-pay | Admitting: Internal Medicine

## 2014-09-11 ENCOUNTER — Other Ambulatory Visit: Payer: Self-pay | Admitting: Family

## 2014-10-17 ENCOUNTER — Other Ambulatory Visit: Payer: Self-pay | Admitting: Family

## 2014-10-19 DIAGNOSIS — R410 Disorientation, unspecified: Secondary | ICD-10-CM | POA: Diagnosis not present

## 2014-10-19 DIAGNOSIS — Z833 Family history of diabetes mellitus: Secondary | ICD-10-CM | POA: Diagnosis not present

## 2014-10-19 DIAGNOSIS — E1165 Type 2 diabetes mellitus with hyperglycemia: Secondary | ICD-10-CM | POA: Diagnosis not present

## 2014-10-19 DIAGNOSIS — R9082 White matter disease, unspecified: Secondary | ICD-10-CM | POA: Diagnosis not present

## 2014-10-19 DIAGNOSIS — I69334 Monoplegia of upper limb following cerebral infarction affecting left non-dominant side: Secondary | ICD-10-CM

## 2014-10-19 DIAGNOSIS — I639 Cerebral infarction, unspecified: Secondary | ICD-10-CM | POA: Diagnosis not present

## 2014-10-19 DIAGNOSIS — I6340 Cerebral infarction due to embolism of unspecified cerebral artery: Secondary | ICD-10-CM | POA: Diagnosis not present

## 2014-10-19 DIAGNOSIS — Z9841 Cataract extraction status, right eye: Secondary | ICD-10-CM | POA: Diagnosis not present

## 2014-10-19 DIAGNOSIS — I5030 Unspecified diastolic (congestive) heart failure: Secondary | ICD-10-CM | POA: Diagnosis present

## 2014-10-19 DIAGNOSIS — R2689 Other abnormalities of gait and mobility: Secondary | ICD-10-CM | POA: Diagnosis not present

## 2014-10-19 DIAGNOSIS — Z794 Long term (current) use of insulin: Secondary | ICD-10-CM

## 2014-10-19 DIAGNOSIS — E669 Obesity, unspecified: Secondary | ICD-10-CM | POA: Diagnosis not present

## 2014-10-19 DIAGNOSIS — R262 Difficulty in walking, not elsewhere classified: Secondary | ICD-10-CM | POA: Diagnosis not present

## 2014-10-19 DIAGNOSIS — Z9842 Cataract extraction status, left eye: Secondary | ICD-10-CM

## 2014-10-19 DIAGNOSIS — R269 Unspecified abnormalities of gait and mobility: Secondary | ICD-10-CM | POA: Diagnosis not present

## 2014-10-19 DIAGNOSIS — Z87891 Personal history of nicotine dependence: Secondary | ICD-10-CM | POA: Diagnosis not present

## 2014-10-19 DIAGNOSIS — I10 Essential (primary) hypertension: Secondary | ICD-10-CM | POA: Diagnosis present

## 2014-10-19 DIAGNOSIS — E538 Deficiency of other specified B group vitamins: Secondary | ICD-10-CM | POA: Diagnosis present

## 2014-10-19 DIAGNOSIS — E041 Nontoxic single thyroid nodule: Secondary | ICD-10-CM | POA: Diagnosis not present

## 2014-10-19 DIAGNOSIS — K5790 Diverticulosis of intestine, part unspecified, without perforation or abscess without bleeding: Secondary | ICD-10-CM | POA: Diagnosis present

## 2014-10-19 DIAGNOSIS — Z791 Long term (current) use of non-steroidal anti-inflammatories (NSAID): Secondary | ICD-10-CM | POA: Diagnosis not present

## 2014-10-19 DIAGNOSIS — Z6829 Body mass index (BMI) 29.0-29.9, adult: Secondary | ICD-10-CM

## 2014-10-19 DIAGNOSIS — H532 Diplopia: Secondary | ICD-10-CM | POA: Diagnosis present

## 2014-10-19 DIAGNOSIS — H538 Other visual disturbances: Secondary | ICD-10-CM | POA: Diagnosis not present

## 2014-10-19 DIAGNOSIS — Z8614 Personal history of Methicillin resistant Staphylococcus aureus infection: Secondary | ICD-10-CM

## 2014-10-19 DIAGNOSIS — R260 Ataxic gait: Secondary | ICD-10-CM | POA: Diagnosis not present

## 2014-10-19 DIAGNOSIS — I679 Cerebrovascular disease, unspecified: Secondary | ICD-10-CM | POA: Diagnosis present

## 2014-10-19 DIAGNOSIS — Z7982 Long term (current) use of aspirin: Secondary | ICD-10-CM | POA: Diagnosis not present

## 2014-10-19 DIAGNOSIS — G467 Other lacunar syndromes: Secondary | ICD-10-CM | POA: Diagnosis not present

## 2014-10-19 DIAGNOSIS — D509 Iron deficiency anemia, unspecified: Secondary | ICD-10-CM | POA: Diagnosis not present

## 2014-10-19 DIAGNOSIS — E785 Hyperlipidemia, unspecified: Secondary | ICD-10-CM | POA: Diagnosis not present

## 2014-10-20 ENCOUNTER — Encounter (HOSPITAL_COMMUNITY): Payer: Self-pay | Admitting: *Deleted

## 2014-10-20 ENCOUNTER — Emergency Department (HOSPITAL_COMMUNITY): Payer: BLUE CROSS/BLUE SHIELD

## 2014-10-20 ENCOUNTER — Inpatient Hospital Stay (HOSPITAL_COMMUNITY)
Admission: EM | Admit: 2014-10-20 | Discharge: 2014-10-23 | DRG: 065 | Disposition: A | Discharge status: Home / Self Care | Payer: BLUE CROSS/BLUE SHIELD | Attending: Infectious Disease | Admitting: Infectious Disease

## 2014-10-20 ENCOUNTER — Observation Stay (HOSPITAL_COMMUNITY): Payer: BLUE CROSS/BLUE SHIELD

## 2014-10-20 DIAGNOSIS — Z8614 Personal history of Methicillin resistant Staphylococcus aureus infection: Secondary | ICD-10-CM | POA: Diagnosis not present

## 2014-10-20 DIAGNOSIS — Z833 Family history of diabetes mellitus: Secondary | ICD-10-CM | POA: Diagnosis not present

## 2014-10-20 DIAGNOSIS — E119 Type 2 diabetes mellitus without complications: Secondary | ICD-10-CM | POA: Insufficient documentation

## 2014-10-20 DIAGNOSIS — Z9841 Cataract extraction status, right eye: Secondary | ICD-10-CM | POA: Diagnosis not present

## 2014-10-20 DIAGNOSIS — E538 Deficiency of other specified B group vitamins: Secondary | ICD-10-CM | POA: Diagnosis present

## 2014-10-20 DIAGNOSIS — I6789 Other cerebrovascular disease: Secondary | ICD-10-CM | POA: Diagnosis not present

## 2014-10-20 DIAGNOSIS — Z6829 Body mass index (BMI) 29.0-29.9, adult: Secondary | ICD-10-CM | POA: Diagnosis not present

## 2014-10-20 DIAGNOSIS — H538 Other visual disturbances: Secondary | ICD-10-CM | POA: Diagnosis not present

## 2014-10-20 DIAGNOSIS — E118 Type 2 diabetes mellitus with unspecified complications: Secondary | ICD-10-CM | POA: Diagnosis not present

## 2014-10-20 DIAGNOSIS — I679 Cerebrovascular disease, unspecified: Secondary | ICD-10-CM | POA: Diagnosis not present

## 2014-10-20 DIAGNOSIS — E785 Hyperlipidemia, unspecified: Secondary | ICD-10-CM | POA: Diagnosis not present

## 2014-10-20 DIAGNOSIS — E1165 Type 2 diabetes mellitus with hyperglycemia: Secondary | ICD-10-CM | POA: Insufficient documentation

## 2014-10-20 DIAGNOSIS — R9082 White matter disease, unspecified: Secondary | ICD-10-CM | POA: Diagnosis not present

## 2014-10-20 DIAGNOSIS — H532 Diplopia: Secondary | ICD-10-CM | POA: Diagnosis not present

## 2014-10-20 DIAGNOSIS — Z7982 Long term (current) use of aspirin: Secondary | ICD-10-CM | POA: Diagnosis not present

## 2014-10-20 DIAGNOSIS — Z9842 Cataract extraction status, left eye: Secondary | ICD-10-CM | POA: Diagnosis not present

## 2014-10-20 DIAGNOSIS — I6340 Cerebral infarction due to embolism of unspecified cerebral artery: Secondary | ICD-10-CM | POA: Diagnosis not present

## 2014-10-20 DIAGNOSIS — K5790 Diverticulosis of intestine, part unspecified, without perforation or abscess without bleeding: Secondary | ICD-10-CM | POA: Diagnosis not present

## 2014-10-20 DIAGNOSIS — Z87891 Personal history of nicotine dependence: Secondary | ICD-10-CM | POA: Diagnosis not present

## 2014-10-20 DIAGNOSIS — D509 Iron deficiency anemia, unspecified: Secondary | ICD-10-CM | POA: Diagnosis present

## 2014-10-20 DIAGNOSIS — I639 Cerebral infarction, unspecified: Secondary | ICD-10-CM | POA: Insufficient documentation

## 2014-10-20 DIAGNOSIS — I69334 Monoplegia of upper limb following cerebral infarction affecting left non-dominant side: Secondary | ICD-10-CM | POA: Diagnosis not present

## 2014-10-20 DIAGNOSIS — I5030 Unspecified diastolic (congestive) heart failure: Secondary | ICD-10-CM | POA: Diagnosis not present

## 2014-10-20 DIAGNOSIS — I638 Other cerebral infarction: Secondary | ICD-10-CM | POA: Diagnosis not present

## 2014-10-20 DIAGNOSIS — R221 Localized swelling, mass and lump, neck: Secondary | ICD-10-CM | POA: Diagnosis not present

## 2014-10-20 DIAGNOSIS — R262 Difficulty in walking, not elsewhere classified: Secondary | ICD-10-CM | POA: Diagnosis not present

## 2014-10-20 DIAGNOSIS — I10 Essential (primary) hypertension: Secondary | ICD-10-CM | POA: Diagnosis not present

## 2014-10-20 DIAGNOSIS — L989 Disorder of the skin and subcutaneous tissue, unspecified: Secondary | ICD-10-CM | POA: Insufficient documentation

## 2014-10-20 DIAGNOSIS — I63339 Cerebral infarction due to thrombosis of unspecified posterior cerebral artery: Secondary | ICD-10-CM | POA: Diagnosis not present

## 2014-10-20 DIAGNOSIS — R269 Unspecified abnormalities of gait and mobility: Secondary | ICD-10-CM | POA: Diagnosis not present

## 2014-10-20 DIAGNOSIS — E1159 Type 2 diabetes mellitus with other circulatory complications: Secondary | ICD-10-CM | POA: Insufficient documentation

## 2014-10-20 DIAGNOSIS — Z8679 Personal history of other diseases of the circulatory system: Secondary | ICD-10-CM

## 2014-10-20 DIAGNOSIS — Z791 Long term (current) use of non-steroidal anti-inflammatories (NSAID): Secondary | ICD-10-CM | POA: Diagnosis not present

## 2014-10-20 DIAGNOSIS — R2681 Unsteadiness on feet: Secondary | ICD-10-CM | POA: Diagnosis not present

## 2014-10-20 DIAGNOSIS — Z794 Long term (current) use of insulin: Secondary | ICD-10-CM | POA: Diagnosis not present

## 2014-10-20 DIAGNOSIS — E041 Nontoxic single thyroid nodule: Secondary | ICD-10-CM | POA: Diagnosis not present

## 2014-10-20 DIAGNOSIS — R260 Ataxic gait: Secondary | ICD-10-CM | POA: Diagnosis not present

## 2014-10-20 DIAGNOSIS — I6381 Other cerebral infarction due to occlusion or stenosis of small artery: Secondary | ICD-10-CM | POA: Diagnosis present

## 2014-10-20 DIAGNOSIS — E669 Obesity, unspecified: Secondary | ICD-10-CM | POA: Diagnosis not present

## 2014-10-20 DIAGNOSIS — R410 Disorientation, unspecified: Secondary | ICD-10-CM | POA: Diagnosis not present

## 2014-10-20 LAB — URINALYSIS, ROUTINE W REFLEX MICROSCOPIC
BILIRUBIN URINE: NEGATIVE
GLUCOSE, UA: NEGATIVE mg/dL
HGB URINE DIPSTICK: NEGATIVE
KETONES UR: NEGATIVE mg/dL
Leukocytes, UA: NEGATIVE
Nitrite: NEGATIVE
Protein, ur: NEGATIVE mg/dL
Specific Gravity, Urine: 1.025 (ref 1.005–1.030)
Urobilinogen, UA: 0.2 mg/dL (ref 0.0–1.0)
pH: 6 (ref 5.0–8.0)

## 2014-10-20 LAB — CBC
HCT: 35.1 % — ABNORMAL LOW (ref 39.0–52.0)
Hemoglobin: 11.2 g/dL — ABNORMAL LOW (ref 13.0–17.0)
MCH: 28 pg (ref 26.0–34.0)
MCHC: 31.9 g/dL (ref 30.0–36.0)
MCV: 87.8 fL (ref 78.0–100.0)
PLATELETS: 313 10*3/uL (ref 150–400)
RBC: 4 MIL/uL — ABNORMAL LOW (ref 4.22–5.81)
RDW: 13.2 % (ref 11.5–15.5)
WBC: 7.8 10*3/uL (ref 4.0–10.5)

## 2014-10-20 LAB — RAPID URINE DRUG SCREEN, HOSP PERFORMED
AMPHETAMINES: NOT DETECTED
BENZODIAZEPINES: NOT DETECTED
Barbiturates: NOT DETECTED
Cocaine: NOT DETECTED
OPIATES: NOT DETECTED
TETRAHYDROCANNABINOL: NOT DETECTED

## 2014-10-20 LAB — ETHANOL: Alcohol, Ethyl (B): <5 mg/dL (ref 0–9)

## 2014-10-20 LAB — COMPREHENSIVE METABOLIC PANEL
ALBUMIN: 4.1 g/dL (ref 3.5–5.2)
ALT: 15 U/L (ref 0–53)
AST: 20 U/L (ref 0–37)
Alkaline Phosphatase: 94 U/L (ref 39–117)
Anion gap: 8 (ref 5–15)
BILIRUBIN TOTAL: 0.4 mg/dL (ref 0.3–1.2)
BUN: 25 mg/dL — ABNORMAL HIGH (ref 6–23)
CO2: 25 mmol/L (ref 19–32)
CREATININE: 1.19 mg/dL (ref 0.50–1.35)
Calcium: 9.4 mg/dL (ref 8.4–10.5)
Chloride: 108 mmol/L (ref 96–112)
GFR calc non Af Amer: 58 mL/min — ABNORMAL LOW (ref >90)
GFR, EST AFRICAN AMERICAN: 67 mL/min — AB (ref >90)
Glucose, Bld: 91 mg/dL (ref 70–99)
POTASSIUM: 3.7 mmol/L (ref 3.5–5.1)
Sodium: 141 mmol/L (ref 135–145)
Total Protein: 7.8 g/dL (ref 6.0–8.3)

## 2014-10-20 LAB — GLUCOSE, CAPILLARY
GLUCOSE-CAPILLARY: 247 mg/dL — AB (ref 70–99)
Glucose-Capillary: 109 mg/dL — ABNORMAL HIGH (ref 70–99)

## 2014-10-20 LAB — TROPONIN I

## 2014-10-20 LAB — APTT: APTT: 28 s (ref 24–37)

## 2014-10-20 LAB — PROTIME-INR
INR: 0.96 (ref 0.00–1.49)
PROTHROMBIN TIME: 12.9 s (ref 11.6–15.2)

## 2014-10-20 MED ORDER — ASPIRIN EC 81 MG PO TBEC
81.0000 mg | DELAYED_RELEASE_TABLET | Freq: Every day | ORAL | Status: DC
  Administered 2014-10-20 – 2014-10-21 (×2): 81 mg via ORAL
  Filled 2014-10-20 (×2): qty 1

## 2014-10-20 MED ORDER — LINAGLIPTIN 5 MG PO TABS
5.0000 mg | ORAL_TABLET | Freq: Every day | ORAL | Status: DC
  Administered 2014-10-21 – 2014-10-23 (×3): 5 mg via ORAL
  Filled 2014-10-20 (×3): qty 1

## 2014-10-20 MED ORDER — STROKE: EARLY STAGES OF RECOVERY BOOK
Freq: Once | Status: AC
  Administered 2014-10-20: 21:00:00

## 2014-10-20 MED ORDER — ACETAMINOPHEN 650 MG RE SUPP: 650.0000 mg | RECTAL | Status: DC | PRN

## 2014-10-20 MED ORDER — INSULIN ASPART 100 UNIT/ML ~~LOC~~ SOLN: 0.0000 [IU] | Freq: Every day | SUBCUTANEOUS | Status: DC

## 2014-10-20 MED ORDER — ONDANSETRON HCL 4 MG/2ML IJ SOLN
4.0000 mg | Freq: Three times a day (TID) | INTRAMUSCULAR | Status: AC | PRN
Start: 2014-10-20 — End: 2014-10-21

## 2014-10-20 MED ORDER — SENNOSIDES-DOCUSATE SODIUM 8.6-50 MG PO TABS: 1.0000 | ORAL_TABLET | Freq: Every evening | ORAL | Status: DC | PRN

## 2014-10-20 MED ORDER — INSULIN ASPART PROT & ASPART (70-30 MIX) 100 UNIT/ML ~~LOC~~ SUSP
10.0000 [IU] | Freq: Two times a day (BID) | SUBCUTANEOUS | Status: DC
  Administered 2014-10-20 – 2014-10-23 (×6): 10 [IU] via SUBCUTANEOUS
  Filled 2014-10-20 (×2): qty 10

## 2014-10-20 MED ORDER — VITAMIN B-12 100 MCG PO TABS
100.0000 ug | ORAL_TABLET | Freq: Every day | ORAL | Status: DC
  Filled 2014-10-20: qty 1

## 2014-10-20 MED ORDER — ATORVASTATIN CALCIUM 40 MG PO TABS
40.0000 mg | ORAL_TABLET | Freq: Every day | ORAL | Status: DC
  Administered 2014-10-21 – 2014-10-23 (×3): 40 mg via ORAL
  Filled 2014-10-20 (×3): qty 1

## 2014-10-20 MED ORDER — VITAMIN B-12 100 MCG PO TABS
100.0000 ug | ORAL_TABLET | Freq: Every day | ORAL | Status: DC
  Administered 2014-10-20 – 2014-10-23 (×4): 100 ug via ORAL
  Filled 2014-10-20 (×4): qty 1

## 2014-10-20 MED ORDER — ACETAMINOPHEN 325 MG PO TABS: 650.0000 mg | ORAL_TABLET | ORAL | Status: DC | PRN

## 2014-10-20 MED ORDER — INSULIN ASPART 100 UNIT/ML ~~LOC~~ SOLN
0.0000 [IU] | Freq: Three times a day (TID) | SUBCUTANEOUS | Status: DC
  Administered 2014-10-20: 3 [IU] via SUBCUTANEOUS
  Administered 2014-10-21: 5 [IU] via SUBCUTANEOUS
  Administered 2014-10-21: 2 [IU] via SUBCUTANEOUS
  Administered 2014-10-22: 1 [IU] via SUBCUTANEOUS
  Administered 2014-10-22 – 2014-10-23 (×3): 2 [IU] via SUBCUTANEOUS
  Administered 2014-10-23: 1 [IU] via SUBCUTANEOUS

## 2014-10-20 MED ORDER — ENOXAPARIN SODIUM 40 MG/0.4ML ~~LOC~~ SOLN
40.0000 mg | SUBCUTANEOUS | Status: DC
  Administered 2014-10-20 – 2014-10-23 (×4): 40 mg via SUBCUTANEOUS
  Filled 2014-10-20 (×4): qty 0.4

## 2014-10-20 NOTE — ED Notes (Signed)
Scott Vasquez, MRI, advised he will request transporter to transport pt to room 4N27 when MRA complete.

## 2014-10-20 NOTE — ED Notes (Signed)
Patient taken to [facility] for an MRI.

## 2014-10-20 NOTE — ED Notes (Addendum)
Pt c/o blurred vision and staggered gait x 2 days that has gotten worse tonight around 9pm. Wife who is here in triage with him states that the pt isn't as alert as normal. Pt states his cbg was 104 at home.

## 2014-10-20 NOTE — ED Notes (Signed)
Patient ambulated around nurses station without difficulty.

## 2014-10-20 NOTE — ED Provider Notes (Signed)
Ambulated with patient, examined eyes, diffuse mild blurred vision, no other focal neurologic findings, no specific ataxia, discussed with neurology, they have seen the patient and recommended admission for stroke workup. Discussed with Dr. Heber [place] of internal medicine teaching service, they will admit.  Noemi Chapel, MD 10/20/14 1044

## 2014-10-20 NOTE — Consult Note (Addendum)
Referring Physician: Dr. Sabra Heck    Chief Complaint: blurred vision, unsteadiness.  HPI:                                                                                                                                         Scott Vasquez is an 74 y.o. male with a past medical history significant for HTN, DM type II, ischemic stroke without residual deficits, sent to [facility] to get MRI to evaluate above symptoms. Patient initially presented to [facility] with complains of bilateral  blurred vision and unstable gait for the past 2 days. Denies associated HA, vertigo, double vision, difficulty swallowing, slurred speech, language impairment. Pt's wife notes pt has appeared dazed and confused. CT brain without acute abnormalityy, but MRI brain reviewed by myself showed a no acute punctate nonhemorrhagic infarct within the left paramedian superior midbrain and inferomedial thalamus.  Date last known well: unknown Time last known well: unknown tPA Given: no, out of the window.    Past Medical History  Diagnosis Date  . Anemia     iron deficiency  . Diabetes mellitus     type II  . Hypertension   . Stroke   . MRSA (methicillin resistant Staphylococcus aureus)   . Diverticulosis of colon     Past Surgical History  Procedure Laterality Date  . Cataract extraction, bilateral    . Back surgery    . Shoulder surgery  08/2007    Supple    Family History  Problem Relation Age of Onset  . Diabetes     Social History:  reports that he has quit smoking. He does not have any smokeless tobacco history on file. He reports that he does not drink alcohol or use illicit drugs. Family history: no epilepsy, brain tumors, or brain aneurysms Allergies: No Known Allergies  Medications:                                                                                                                           Scheduled:  ROS:  History obtained from the patient and chart review  General ROS: negative for - chills, fatigue, fever, night sweats, weight gain or weight loss Psychological ROS: negative for - behavioral disorder, hallucinations, memory difficulties, mood swings or suicidal ideation Ophthalmic ROS: negative for - double vision, eye pain or loss of vision ENT ROS: negative for - epistaxis, nasal discharge, oral lesions, sore throat, tinnitus or vertigo Allergy and Immunology ROS: negative for - hives or itchy/watery eyes Hematological and Lymphatic ROS: negative for - bleeding problems, bruising or swollen lymph nodes Endocrine ROS: negative for - galactorrhea, hair pattern changes, polydipsia/polyuria or temperature intolerance Respiratory ROS: negative for - cough, hemoptysis, shortness of breath or wheezing Cardiovascular ROS: negative for - chest pain, dyspnea on exertion, edema or irregular heartbeat Gastrointestinal ROS: negative for - abdominal pain, diarrhea, hematemesis, nausea/vomiting or stool incontinence Genito-Urinary ROS: negative for - dysuria, hematuria, incontinence or urinary frequency/urgency Musculoskeletal ROS: negative for - joint swelling or muscular weakness Neurological ROS: as noted in HPI Dermatological ROS: negative for rash and skin lesion changes  Physical exam: pleasant male in no apparent distress. Blood pressure 133/65, pulse 64, temperature 97.9 F (36.6 C), temperature source Oral, resp. rate 18, height 5' 10.5" (1.791 m), weight 90.719 kg (200 lb), SpO2 96 %. Head: normocephalic. Neck: supple, no bruits, no JVD. Cardiac: no murmurs. Lungs: clear. Abdomen: soft, no tender, no mass. Extremities: no edema. Skin: no rash Neurologic Examination:                                                                                                      General: Mental Status: Alert, oriented, thought content appropriate.  Speech  fluent without evidence of aphasia.  Able to follow 3 step commands without difficulty. Cranial Nerves: II: Discs flat bilaterally; Visual fields grossly normal, pupils equal, round, reactive to light and accommodation III,IV, VI: ptosis not present, extra-ocular motions intact bilaterally V,VII: smile symmetric, facial light touch sensation normal bilaterally VIII: hearing normal bilaterally IX,X: uvula rises symmetrically XI: bilateral shoulder shrug XII: midline tongue extension without atrophy or fasciculations  Motor: Right : Upper extremity   5/5    Left:     Upper extremity   5/5  Lower extremity   5/5     Lower extremity   5/5 Tone and bulk:normal tone throughout; no atrophy noted Sensory: Pinprick and light touch intact throughout, bilaterally Deep Tendon Reflexes:  1+ all over Plantars: Right: downgoing   Left: downgoing Cerebellar: normal finger-to-nose,  normal heel-to-shin test Gait:  No ataxia    Results for orders placed or performed during the hospital encounter of 10/20/14 (from the past 48 hour(s))  CBC     Status: Abnormal   Collection Time: 10/20/14 12:51 AM  Result Value Ref Range   WBC 7.8 4.0 - 10.5 K/uL   RBC 4.00 (L) 4.22 - 5.81 MIL/uL   Hemoglobin 11.2 (L) 13.0 - 17.0 g/dL   HCT 35.1 (L) 39.0 - 52.0 %   MCV 87.8 78.0 - 100.0 fL   MCH 28.0 26.0 - 34.0 pg   MCHC 31.9  30.0 - 36.0 g/dL   RDW 13.2 11.5 - 15.5 %   Platelets 313 150 - 400 K/uL  Protime-INR     Status: None   Collection Time: 10/20/14 12:51 AM  Result Value Ref Range   Prothrombin Time 12.9 11.6 - 15.2 seconds   INR 0.96 0.00 - 1.49  APTT     Status: None   Collection Time: 10/20/14 12:51 AM  Result Value Ref Range   aPTT 28 24 - 37 seconds  Ethanol     Status: None   Collection Time: 10/20/14  1:20 AM  Result Value Ref Range   Alcohol, Ethyl (B) <5 0 - 9 mg/dL    Comment:        LOWEST DETECTABLE LIMIT FOR SERUM ALCOHOL IS 11 mg/dL FOR MEDICAL PURPOSES ONLY   Comprehensive  metabolic panel     Status: Abnormal   Collection Time: 10/20/14  1:20 AM  Result Value Ref Range   Sodium 141 135 - 145 mmol/L   Potassium 3.7 3.5 - 5.1 mmol/L   Chloride 108 96 - 112 mmol/L   CO2 25 19 - 32 mmol/L   Glucose, Bld 91 70 - 99 mg/dL   BUN 25 (H) 6 - 23 mg/dL   Creatinine, Ser 1.19 0.50 - 1.35 mg/dL   Calcium 9.4 8.4 - 10.5 mg/dL   Total Protein 7.8 6.0 - 8.3 g/dL   Albumin 4.1 3.5 - 5.2 g/dL   AST 20 0 - 37 U/L   ALT 15 0 - 53 U/L   Alkaline Phosphatase 94 39 - 117 U/L   Total Bilirubin 0.4 0.3 - 1.2 mg/dL   GFR calc non Af Amer 58 (L) >90 mL/min   GFR calc Af Amer 67 (L) >90 mL/min    Comment: (NOTE) The eGFR has been calculated using the CKD EPI equation. This calculation has not been validated in all clinical situations. eGFR's persistently <90 mL/min signify possible Chronic Kidney Disease.    Anion gap 8 5 - 15  Troponin I     Status: None   Collection Time: 10/20/14  1:20 AM  Result Value Ref Range   Troponin I <0.03 <0.031 ng/mL    Comment:        NO INDICATION OF MYOCARDIAL INJURY.   Urinalysis, Routine w reflex microscopic     Status: None   Collection Time: 10/20/14  2:20 AM  Result Value Ref Range   Color, Urine YELLOW YELLOW   APPearance CLEAR CLEAR   Specific Gravity, Urine 1.025 1.005 - 1.030   pH 6.0 5.0 - 8.0   Glucose, UA NEGATIVE NEGATIVE mg/dL   Hgb urine dipstick NEGATIVE NEGATIVE   Bilirubin Urine NEGATIVE NEGATIVE   Ketones, ur NEGATIVE NEGATIVE mg/dL   Protein, ur NEGATIVE NEGATIVE mg/dL   Urobilinogen, UA 0.2 0.0 - 1.0 mg/dL   Nitrite NEGATIVE NEGATIVE   Leukocytes, UA NEGATIVE NEGATIVE    Comment: MICROSCOPIC NOT DONE ON URINES WITH NEGATIVE PROTEIN, BLOOD, LEUKOCYTES, NITRITE, OR GLUCOSE <1000 mg/dL.  Urine Drug Screen     Status: None   Collection Time: 10/20/14  2:20 AM  Result Value Ref Range   Opiates NONE DETECTED NONE DETECTED   Cocaine NONE DETECTED NONE DETECTED   Benzodiazepines NONE DETECTED NONE DETECTED    Amphetamines NONE DETECTED NONE DETECTED   Tetrahydrocannabinol NONE DETECTED NONE DETECTED   Barbiturates NONE DETECTED NONE DETECTED    Comment:        DRUG SCREEN FOR MEDICAL PURPOSES ONLY.  IF CONFIRMATION IS NEEDED FOR ANY PURPOSE, NOTIFY LAB WITHIN 5 DAYS.        LOWEST DETECTABLE LIMITS FOR URINE DRUG SCREEN Drug Class       Cutoff (ng/mL) Amphetamine      1000 Barbiturate      200 Benzodiazepine   800 Tricyclics       349 Opiates          300 Cocaine          300 THC              50    Ct Head Wo Contrast  10/20/2014   CLINICAL DATA:  Blurry vision for 3 days, worsening. Unsteady gait and confusion for 2 days. Diarrhea.  EXAM: CT HEAD WITHOUT CONTRAST  TECHNIQUE: Contiguous axial images were obtained from the base of the skull through the vertex without intravenous contrast.  COMPARISON:  MRI of the brain report March 04, 2000 though images are not available for direct comparison.  FINDINGS: The ventricles and sulci are normal for age. No intraparenchymal hemorrhage, mass effect nor midline shift. Patchy supratentorial white matter hypodensities are within normal range for patient's age and though non-specific suggest sequelae of chronic small vessel ischemic disease. No acute large vascular territory infarcts. Subcentimeter hypodensity mesial RIGHT thalamus.  No abnormal extra-axial fluid collections. Basal cisterns are patent. Mild calcific atherosclerosis of the carotid siphons.  No skull fracture. LEFT parietal scalp scarring. The included ocular globes and orbital contents are non-suspicious. Bilateral ocular lens implants. The mastoid aircells and included paranasal sinuses are well-aerated.  IMPRESSION: No acute intracranial process ; probable remote RIGHT medial thalamic lacunar infarct.   Electronically Signed   By: Elon Alas   On: 10/20/2014 03:00   Mr Brain Wo Contrast  10/20/2014   CLINICAL DATA:  Difficulty walking and visual problems for 2 days. Personal history  of stroke. Abnormal CT.  EXAM: MRI HEAD WITHOUT CONTRAST  TECHNIQUE: Multiplanar, multiecho pulse sequences of the brain and surrounding structures were obtained without intravenous contrast.  COMPARISON:  CT head from the same day.  FINDINGS: Acute punctate lacunar infarct is evident within the superior midbrain and inferomedial thalamus. The lesion at the genu of the right internal capsule is remote. There are multiple other remote lacunar infarcts involving the thalami bilaterally. Remote lacunar infarcts are present within the centrum semi of bowel IA, left greater than right. Periventricular white matter changes are noted bilaterally.  The susceptibility weighted images demonstrate 2 foci of remote hemorrhage in the midbrain and additional foci within the thalami bilaterally.  Flow is present in the major intracranial arteries. The patient is status post bilateral lens replacements. The globes and orbits are otherwise intact. The paranasal sinuses and the mastoid air cells are clear. The skullbase is within normal limits.  IMPRESSION: 1. No acute punctate nonhemorrhagic infarct within the left paramedian superior midbrain and inferomedial thalamus. 2. Multiple other remote lacunar infarcts are evident within the thalami bilaterally. 3. Remote lacunar infarct of the genu of the left right internal capsule. 4. 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 5. Multiple foci of remote hemorrhage within the thalami a into lesions in the midbrain. This can be seen in the setting of hypertension or vasculitis. No acute hemorrhage is evident.   Electronically Signed   By: San Morelle M.D.   On: 10/20/2014 08:02   Assessment: 74 y.o. male with complains of unsteady gait and bilateral blurred vision for the past 2  days. Neuro-exam non focal. MRI brain disclosed no acute punctuate infarcts within the left paramedian superior midbrain and inferomedial thalamus.  Admit to  medicine. Complete stroke work up.  Aspirin after passing swallowing evaluation. Stroke team will follow up tomorrow.    Stroke Risk Factors - age, HTN, DM, prior stroke.  Plan: 1. HgbA1c, fasting lipid panel 2. MRI, MRA  of the brain without contrast 3. Echocardiogram 4. Carotid dopplers 5. Prophylactic therapy-aspirin 6. Risk factor modification 7. Telemetry monitoring 8. Frequent neuro checks 9. PT/OT SLP  Dorian Pod ,MD [facility] [phone]  10/20/2014, 9:52 AM

## 2014-10-20 NOTE — Progress Notes (Signed)
Report received from ER; patient received via stretcher; patient is alert and oriented; c/o blurred vision persists; patient oriented to room and unit routine; fall safety plan in place and bed alarm explained to the patient.  Cooperative; family has arrived at bedside.

## 2014-10-20 NOTE — H&P (Cosign Needed)
Date: 10/20/2014               Patient Name:  Scott Vasquez MRN: [id number]  DOB: 1941-07-09 Age / Sex: 74 y.o., male   PCP: Timoteo Gaul, FNP              Medical Service: Internal Medicine Teaching Service              Attending Physician: Dr. Truman Hayward, MD         Chief Complaint: Chief Complaint  Patient presents with  . Blurred Vision   <principal problem not specified>  HPI: Scott Vasquez is a 74 y.o. male with PMHx of stroke with a residual of left hand numbness,  HTN, DM II, Hyperlipidemia, Vit B-12 deficiency, iron deficiency anemia who presented with blurred vision and unsteadiness.  Pt stated that on 10/17/14 he started to have blurred vision and this have been worsening, yesterday he went to [facility] and while he was driving he had difficulty seeing the road and stop signs,he could tell how far they were he managed to make it home. When he got to his house, he noticed that he had trouble finding his balance especially when he tried to take his clothes off. At that point , he told his wife and they decided to go to the hospital.  CT of the head was negative for acute intracranial process but MRI of the brain showed  MRI of brain showed an acute punctuated nonhemorrhagic infarct within the left paramedian superior midbrain and inferomedial thalamus. Of note pt report an acute stroke more than 10 years ago which resulted to left hand paresthesia and numbness, he states that he still have mild numbness to left hand.  Pt denies dysphagia, fever, cough , anorexia, sick contact or diarrhea.   Review of Systems:  HEENT: Pt endorses  horizontal  Diplopia Denies photophobia, eye pain, redness, hearing loss, ear pain, congestion, sore throat, rhinorrhea, sneezing, mouth sores, trouble swallowing, neck pain, neck stiffness and tinnitus.  Cardiovascular: Denies palpitations and leg swelling.  Gastrointestinal: Denies nausea, vomiting, abdominal pain, diarrhea,  constipation, blood in stool and abdominal distention.  Genitourinary: Denies dysuria, urgency, frequency, hematuria, flank pain and difficulty urinating.  Musculoskeletal: Denies myalgias, back pain, joint swelling, arthralgias and gait problem.  Skin: Denies pallor, rash and wound.  Neurological: Denies seizures, syncope, weakness, lightheadedness and headaches. Reports numbness to left hand.  Hematological: Denies adenopathy. Easy bruising, personal or family bleeding history    Allergies: Review of patient's allergies indicates no known allergies.  Medications:  @HMEDSABBREVIATED @  Medical History: Past Medical History  Diagnosis Date  . Anemia     iron deficiency  . Diabetes mellitus     type II  . Hypertension   . Stroke   . MRSA (methicillin resistant Staphylococcus aureus)   . Diverticulosis of colon     Surgical History: Past Surgical History  Procedure Laterality Date  . Cataract extraction, bilateral    . Back surgery    . Shoulder surgery  08/2007    Supple    Social History: History   Social History  . Marital Status: Married    Spouse Name: N/A  . Number of Children: N/A  . Years of Education: N/A   Occupational History  . retired Paediatric nurse   Social History Main Topics  . Smoking status: Former Research scientist (life sciences)  . Smokeless tobacco: Not on file  . Alcohol Use: No  . Drug Use: No  .  Sexual Activity: Not on file   Other Topics Concern  . Not on file   Social History Narrative    Family History: Family History  Problem Relation Age of Onset  . Diabetes         Labs/Studies: @VSSPO2RANGES @, No intake or output data in the 24 hours ending 10/20/14 1103  Physical Exam: General:  healthy, cooperative, awake and alert,  No acute distress Skin: Skin color, texture, turgor normal. No rashes or lesions. HEENT:  Head : Atraumatic, normocephalic. Nose: no nasal congestion.patent, no discharge Throat: No tonsillar erythema, exudates or  enlargement. Mouth: Moist mucus, no lesions, dentition good. Neck: supple, no LAD Heart: Pulse regular rate and rhythm, normal S1S2, no murmurs appreciated.  Chest/ Lung: Normal depth and effort.  No tenderness to palpation Clear lungs sounds No rales, rhonchi, wheezing, or rubs. Abdomen: Soft, NTND, +BS, no hepatosplenomegaly noted.  Extremities: Warm and well perfused, no clubbing/cyanosis/edema. Radial, pedal pulse +2, no edema. Neuro: CN II: Visual fields are full to confrontation. Pupils are 3 mm and briskly reactive to light.  CN III, IV, VI: At primary gaze, there is no eye deviation. Pt reports horizontal diplopia.  CN V: Facial sensation is intact. CN VII: Face is symmetric with normal eye closure and smile. CN VII: Hearing is normal to rubbing fingers CN IX, X: Palate elevates symmetrically. Phonation is normal. CN XI: Head turning and shoulder shrug are intact CN XII: Tongue is midline with normal movements and no atrophy. Motor: There is no pronator drift of out-stretched arms. Muscle bulk and tone are normal. Strength is full all extremities bilaterally. Reflexes: Reflexes are 2+ and symmetric at the biceps, triceps, knees, and ankles. Sensory: Light touch are intact except some mild numbness to left hand.  Coordination: Rapid alternating movements and fine finger movements are intact. There is no dysmetria on finger-to-nose and heel-knee-shin. There are no abnormal or extraneous movements. Romberg is absent. Gait: Posture is normal. Gait is steady with normal steps.  Assessment/Plan:  Active Problems:   Stroke    Scott Vasquez is a 74 y.o. male with PMHx of stroke with a residual of left hand numbness,  HTN, DM II, Hyperlipidemia that presented with blurred vision and unsteadiness.   # Acute Ischemic CVA: pt with of an history of CVA  and with risk factor of DM, hypertension and hyperlipidemia who presents today the ED for blurred vision an unsteadiness. CT of the  head was negative for an acute event but MRI of brain showed an acute punctuated nonhemorrhagic infarct within the left paramedian superior midbrain and inferomedial thalamus. Pt is out of the window for TPA therapy. Pt still endorses horizontal diplopia.   -appreciate neurology consult. -cardiac monitoring. -Echocardiogram  -Carotid doppler. -Physical therapy evaluation  -Swallow evaluation -Q2 neuro check -continue Aspirin for prophylactic therapy. - # Diabetes Mellitus Type II: Last Hb A1c on 07/20/14=8.8. Pt takes Humulin 70/30 10 units BID, Januvia 100 mg daily  and glipizide-metformin 5-500mg  BID,   -hold metformin -continue home Insuline70/30 -continue home Januvia -Sensitive SSI -Hb A1C -carb modified diet -CBG AC & HS   # Hypertension: Home regiment consists of Lisinopril 40 mg daily, Amlodipine 5  mg daily, current BP is 144/73. Pt is positive for an acute Thalamic infarct, therefore we will allow for permissive hypertension.  -hold lisinopril -hold Amlodipine  - BP goal of SBP < 220  # Hyperlipidemia: Patient takes atorvastatin 40 mg daily at home.  -continue Atorvastatin 40 mg daily. -  obtain lipid panel   # Normocytic anemia: Pt Hb is 11.2  With MCV of 87.8 .  Iron deficiency is noted in pt history but he is not taking any iron supplementation and there is no iron study in the system except normal Ferritin in 2014. Pt is actually taking vitamin  B-12 supplementation 100 mc daily. Last Vit B-12 value on 03-23-14 was normal at 818 pg/ml.   - continue vitamin B-12 daily -consider obtaining iron study.    Diet: carb modified.  Prophylaxis: SCD and Lovenox   This is a Careers information officer Note.  The care of the patient was discussed with Dr. Raelene Bott and the assessment and plan was formulated with their assistance.  Please see their note for official documentation of the patient encounter.

## 2014-10-20 NOTE — ED Provider Notes (Signed)
CSN: [id number]     Arrival date & time 10/19/14  2355 History  This chart was scribed for Scott Porter, MD by Chester Holstein, ED Scribe. This patient was seen in room APA03/APA03 and the patient's care was started at 12:51 AM.    Chief Complaint  Patient presents with  . Blurred Vision     The history is provided by the patient and the spouse. No language interpreter was used.   HPI Comments: Scott Vasquez is a 74 y.o. male with PMHx of DM, HTN, and CVA who presents to the Emergency Department complaining of bilateral blurred vision or "not clear" vision with onset 2 days ago. Pt notes it was afternoon at onset. Pt notes unstable gait, stating he "staggers and weaves" when he ambulates. Pt's wife notes pt has appeared dazed and confused.She also reports he is holding onto things when he is walking. Pt notes diarrhea with onset yesterday. Pt reports blood sugars have been controlled recently. Pt is not a smoker. Pt denies numbness and weakness in extremities, headache, nausea, vomiting, .He has never had this before. Pt with FHx of CVA.   Pt's PCP is Dr. [name] at [facility]   Past Medical History  Diagnosis Date  . Anemia     iron deficiency  . Diabetes mellitus     type II  . Hypertension   . Stroke   . MRSA (methicillin resistant Staphylococcus aureus)   . Diverticulosis of colon    Past Surgical History  Procedure Laterality Date  . Cataract extraction, bilateral    . Back surgery    . Shoulder surgery  08/2007    Supple   Family History  Problem Relation Age of Onset  . Diabetes     History  Substance Use Topics  . Smoking status: Former Research scientist (life sciences)  . Smokeless tobacco: Not on file  . Alcohol Use: No  lives at home Lives with spouse  Review of Systems  Eyes: Positive for visual disturbance.  Gastrointestinal: Positive for diarrhea. Negative for nausea and vomiting.  Musculoskeletal: Positive for gait problem.  Neurological: Negative for weakness, numbness and  headaches.  Psychiatric/Behavioral: Positive for confusion.      Allergies  Review of patient's allergies indicates no known allergies.  Home Medications   Prior to Admission medications   Medication Sig Start Date End Date Taking? Authorizing Provider  amLODipine (NORVASC) 5 MG tablet Take 1 tablet (5 mg total) by mouth daily. 09/05/14  Yes Timoteo Gaul, FNP  aspirin 81 MG tablet Take 81 mg by mouth daily.     Yes Historical Provider, MD  atorvastatin (LIPITOR) 40 MG tablet TAKE ONE TABLET BY MOUTH DAILY 09/11/14  Yes Timoteo Gaul, FNP  cyanocobalamin (,VITAMIN B-12,) 1000 MCG/ML injection Inject 1,000 mcg into the muscle every 30 (thirty) days.     Yes Historical Provider, MD  glipiZIDE-metformin (METAGLIP) 5-500 MG per tablet TAKE TWO (2) TABLETS BY MOUTH TWICE DAILY BEFORE MEALS 10/17/14  Yes Timoteo Gaul, FNP  HUMULIN 70/30 (70-30) 100 UNIT/ML injection INJECT 10 UNITS SUBCUTANEOUSLY TWICE DAILY (BEFORE BREAKFAST AND DINNER) 07/30/14  Yes Timoteo Gaul, FNP  Insulin Syringe-Needle U-100 (INSULIN SYRINGE 1CC/31GX5/16") 31G X 5/16" 1 ML MISC Use two times a day as directed.    Yes Historical Provider, MD  lisinopril (PRINIVIL,ZESTRIL) 40 MG tablet TAKE ONE TABLET ONCE DAILY 09/11/14  Yes Timoteo Gaul, FNP  meloxicam (MOBIC) 7.5 MG tablet TAKE ONE TABLET BY MOUTH ONCE DAILY 09/11/14  Yes Timoteo Gaul, FNP  sitaGLIPtin (JANUVIA) 100 MG tablet Take 1 tablet (100 mg total) by mouth daily. 07/23/14  Yes Timoteo Gaul, FNP   BP 152/79 mmHg  Pulse 74  Temp(Src) 98.1 F (36.7 C)  Resp 20  Ht 5' 10.5" (1.791 m)  Wt 200 lb (90.719 kg)  BMI 28.28 kg/m2  SpO2 100%  Vital signs normal    Visual Acuity Visual Acuity - R Distance: 20/40 ; L Distance: 20/40       Physical Exam  Constitutional: He is oriented to person, place, and time. He appears well-developed and well-nourished.  Non-toxic appearance. He does not appear ill. No distress.  HENT:  Head:  Normocephalic and atraumatic.  Right Ear: External ear normal.  Left Ear: External ear normal.  Nose: Nose normal. No mucosal edema or rhinorrhea.  Mouth/Throat: Oropharynx is clear and moist and mucous membranes are normal. No dental abscesses or uvula swelling.  Eyes: Conjunctivae and EOM are normal. Pupils are equal, round, and reactive to light.  Neck: Normal range of motion and full passive range of motion without pain. Neck supple.  Cardiovascular: Normal rate, regular rhythm and normal heart sounds.  Exam reveals no gallop and no friction rub.   No murmur heard. Pulmonary/Chest: Effort normal and breath sounds normal. No respiratory distress. He has no wheezes. He has no rhonchi. He has no rales. He exhibits no tenderness and no crepitus.  Abdominal: Soft. Normal appearance and bowel sounds are normal. He exhibits no distension. There is no tenderness. There is no rebound and no guarding.  Musculoskeletal: Normal range of motion. He exhibits no edema or tenderness.  Moves all extremities well.   Neurological: He is alert and oriented to person, place, and time. He has normal strength. No cranial nerve deficit. Coordination normal.  No pronator drift; finger to nose mildly uncoordinated on the left; heel to shin normal bilaterally; no motor weakness noted  Skin: Skin is warm, dry and intact. No rash noted. No erythema. No pallor.  Psychiatric: He has a normal mood and affect. His speech is normal and behavior is normal. His mood appears not anxious.  Nursing note and vitals reviewed.   ED Course  Procedures (including critical care time)  Medications - No data to display    DIAGNOSTIC STUDIES: Oxygen Saturation is 100% on room air, normal by my interpretation.    COORDINATION OF CARE: 1:04 AM Discussed treatment plan with patient at beside, the patient agrees with the plan and has no further questions at this time.  Patient was ambulated by nursing staff and ambulated well  without difficulty. Patient needs an MRI however there is no MRI available here for another 2 days. I talked to the MRI tech at [facility] and they said that if he comes down they can work him in as long as he is in ER patient.  Arrangements were made for patient to go down to [facility] to get a MRI. He was transported by [facility].  4:38 AM I spoke to [facility], Camera operator at [employer] who is aware patient is coming.  4:39 AM I spoke to Dr. Darl Householder who is aware patient is coming. The plan will be if his MRI is positive he will remain at [facility] emergency department for admission there. We will not have a neurologist in house for 2 more days. If his scan is normal he can return to [facility] to be discharged.  08:14 Dr Noemi Chapel, ED  physician at [facility] called, he will arrange for neurology to admit at [facility].      Labs Review Results for orders placed or performed during the hospital encounter of 10/20/14  CBC  Result Value Ref Range   WBC 7.8 4.0 - 10.5 K/uL   RBC 4.00 (L) 4.22 - 5.81 MIL/uL   Hemoglobin 11.2 (L) 13.0 - 17.0 g/dL   HCT 35.1 (L) 39.0 - 52.0 %   MCV 87.8 78.0 - 100.0 fL   MCH 28.0 26.0 - 34.0 pg   MCHC 31.9 30.0 - 36.0 g/dL   RDW 13.2 11.5 - 15.5 %   Platelets 313 150 - 400 K/uL  Urinalysis, Routine w reflex microscopic  Result Value Ref Range   Color, Urine YELLOW YELLOW   APPearance CLEAR CLEAR   Specific Gravity, Urine 1.025 1.005 - 1.030   pH 6.0 5.0 - 8.0   Glucose, UA NEGATIVE NEGATIVE mg/dL   Hgb urine dipstick NEGATIVE NEGATIVE   Bilirubin Urine NEGATIVE NEGATIVE   Ketones, ur NEGATIVE NEGATIVE mg/dL   Protein, ur NEGATIVE NEGATIVE mg/dL   Urobilinogen, UA 0.2 0.0 - 1.0 mg/dL   Nitrite NEGATIVE NEGATIVE   Leukocytes, UA NEGATIVE NEGATIVE  Ethanol  Result Value Ref Range   Alcohol, Ethyl (B) <5 0 - 9 mg/dL  Protime-INR  Result Value Ref Range   Prothrombin Time 12.9 11.6 - 15.2 seconds   INR 0.96 0.00 - 1.49  APTT  Result Value Ref Range   aPTT 28 24 -  37 seconds  Comprehensive metabolic panel  Result Value Ref Range   Sodium 141 135 - 145 mmol/L   Potassium 3.7 3.5 - 5.1 mmol/L   Chloride 108 96 - 112 mmol/L   CO2 25 19 - 32 mmol/L   Glucose, Bld 91 70 - 99 mg/dL   BUN 25 (H) 6 - 23 mg/dL   Creatinine, Ser 1.19 0.50 - 1.35 mg/dL   Calcium 9.4 8.4 - 10.5 mg/dL   Total Protein 7.8 6.0 - 8.3 g/dL   Albumin 4.1 3.5 - 5.2 g/dL   AST 20 0 - 37 U/L   ALT 15 0 - 53 U/L   Alkaline Phosphatase 94 39 - 117 U/L   Total Bilirubin 0.4 0.3 - 1.2 mg/dL   GFR calc non Af Amer 58 (L) >90 mL/min   GFR calc Af Amer 67 (L) >90 mL/min   Anion gap 8 5 - 15  Urine Drug Screen  Result Value Ref Range   Opiates NONE DETECTED NONE DETECTED   Cocaine NONE DETECTED NONE DETECTED   Benzodiazepines NONE DETECTED NONE DETECTED   Amphetamines NONE DETECTED NONE DETECTED   Tetrahydrocannabinol NONE DETECTED NONE DETECTED   Barbiturates NONE DETECTED NONE DETECTED  Troponin I  Result Value Ref Range   Troponin I <0.03 <0.031 ng/mL   Laboratory interpretation all normal except stable anemia     Imaging Review Ct Head Wo Contrast  10/20/2014   CLINICAL DATA:  Blurry vision for 3 days, worsening. Unsteady gait and confusion for 2 days. Diarrhea.  EXAM: CT HEAD WITHOUT CONTRAST  TECHNIQUE: Contiguous axial images were obtained from the base of the skull through the vertex without intravenous contrast.  COMPARISON:  MRI of the brain report March 04, 2000 though images are not available for direct comparison.  FINDINGS: The ventricles and sulci are normal for age. No intraparenchymal hemorrhage, mass effect nor midline shift. Patchy supratentorial white matter hypodensities are within normal range for patient's age and though  non-specific suggest sequelae of chronic small vessel ischemic disease. No acute large vascular territory infarcts. Subcentimeter hypodensity mesial RIGHT thalamus.  No abnormal extra-axial fluid collections. Basal cisterns are patent. Mild  calcific atherosclerosis of the carotid siphons.  No skull fracture. LEFT parietal scalp scarring. The included ocular globes and orbital contents are non-suspicious. Bilateral ocular lens implants. The mastoid aircells and included paranasal sinuses are well-aerated.  IMPRESSION: No acute intracranial process ; probable remote RIGHT medial thalamic lacunar infarct.   Electronically Signed   By: Elon Alas   On: 10/20/2014 03:00   Mr Brain Wo Contrast  10/20/2014   CLINICAL DATA:  Difficulty walking and visual problems for 2 days. Personal history of stroke. Abnormal CT.  EXAM: MRI HEAD WITHOUT CONTRAST  TECHNIQUE: Multiplanar, multiecho pulse sequences of the brain and surrounding structures were obtained without intravenous contrast.  COMPARISON:  CT head from the same day.  FINDINGS: Acute punctate lacunar infarct is evident within the superior midbrain and inferomedial thalamus. The lesion at the genu of the right internal capsule is remote. There are multiple other remote lacunar infarcts involving the thalami bilaterally. Remote lacunar infarcts are present within the centrum semi of bowel IA, left greater than right. Periventricular white matter changes are noted bilaterally.  The susceptibility weighted images demonstrate 2 foci of remote hemorrhage in the midbrain and additional foci within the thalami bilaterally.  Flow is present in the major intracranial arteries. The patient is status post bilateral lens replacements. The globes and orbits are otherwise intact. The paranasal sinuses and the mastoid air cells are clear. The skullbase is within normal limits.  IMPRESSION: 1. No acute punctate nonhemorrhagic infarct within the left paramedian superior midbrain and inferomedial thalamus. 2. Multiple other remote lacunar infarcts are evident within the thalami bilaterally. 3. Remote lacunar infarct of the genu of the left right internal capsule. 4. A advanced white matter disease compatible with  chronic microvascular ischemia. 5. Multiple foci of remote hemorrhage within the thalami a into lesions in the midbrain. This can be seen in the setting of hypertension or vasculitis. No acute hemorrhage is evident.   Electronically Signed   By: San Morelle M.D.   On: 10/20/2014 08:02      EKG Interpretation   Date/Time:  Saturday October 20 2014 01:12:55 EST Ventricular Rate:  73 PR Interval:  191 QRS Duration: 85 QT Interval:  383 QTC Calculation: 422 R Axis:   47 Text Interpretation:  Sinus rhythm Abnormal R-wave progression, early  transition Since last tracing 27 Feb 2000 T wave inversion no longer  evident in Inferior leads Confirmed by Naarah Borgerding  MD-I, Cia Garretson ([id number]) on  10/20/2014 1:22:21 AM      MDM   Final diagnoses:  Unsteady gait  Acute lacunar stroke   Plan admission at [place], MD, FACEP    I personally performed the services described in this documentation, which was scribed in my presence. The recorded information has been reviewed and considered.  Scott Porter, MD, Barbette Or, MD 10/20/14 [id number]

## 2014-10-20 NOTE — ED Notes (Signed)
Patient transported to CT 

## 2014-10-20 NOTE — ED Notes (Signed)
Pt refused wheelchair and walked to the room with normal, steady gait with no complaints.

## 2014-10-20 NOTE — H&P (Signed)
Date: 10/20/2014               Patient Name:  Scott Vasquez MRN: [id number]  DOB: Feb 09, 1941 Age / Sex: 74 y.o., male   PCP: Timoteo Gaul, FNP         Medical Service: Internal Medicine Teaching Service         Attending Physician: Dr. Truman Hayward, MD    First Contact: Dr. Raelene Bott Pager: [phone]  Second Contact: Dr. Heber Lakemore Pager: [phone]       After Hours (After 5p/  First Contact Pager: [phone]  weekends / holidays): Second Contact Pager: [phone]   Chief Complaint: Blurred Vision   History of Present Illness:   Patient is a 74 year old gentleman with a history of type 2 DM, hypertension, hyperlipidemia, prior CVA who presents with blurred vision and unsteady gait that may have started around March 9. Patient states that his vision blurred around Wednesday night into Thursday (10/17/14). He noticed that he was seeing double and blurred objects when he was driving in his car. He stated that he saw a red light, but could not tell how close he was to the traffic light. He was able to make it home safely, though sought medical attention after consulting his family at home about his symptoms. He also felt unsteady on his feet and was unsure whether this was secondary to his vision changes or otherwise. He was then evaluated at [facility] for his symptoms where they performed CT head that was unrevealing. Because of this, he was told to come to [facility] for further evaluation.   Otherwise, patient denies any dysarthria, dizziness, weakness, recent illnesses, cough, choking, dysphagia, dysuria, hesitancy, frequency, diarrhea, constipation, paresthesias. Patient has a previous history of CVA around 10 years ago. At that time, he felt that the CVA was remarkable for paresthesias in his left hand and states that he still has some residual paresthesias.   Meds: Medications Prior to Admission  Medication Sig Dispense Refill  . amLODipine (NORVASC) 5 MG tablet Take 1 tablet (5  mg total) by mouth daily. 90 tablet 1  . aspirin 81 MG tablet Take 81 mg by mouth daily.      Marland Kitchen atorvastatin (LIPITOR) 40 MG tablet TAKE ONE TABLET BY MOUTH DAILY 90 tablet 1  . cyanocobalamin 100 MCG tablet Take 100 mcg by mouth daily.    Marland Kitchen glipiZIDE-metformin (METAGLIP) 5-500 MG per tablet TAKE TWO (2) TABLETS BY MOUTH TWICE DAILY BEFORE MEALS 120 tablet 2  . HUMULIN 70/30 (70-30) 100 UNIT/ML injection INJECT 10 UNITS SUBCUTANEOUSLY TWICE DAILY (BEFORE BREAKFAST AND DINNER) 10 mL 3  . Insulin Syringe-Needle U-100 (INSULIN SYRINGE 1CC/31GX5/16") 31G X 5/16" 1 ML MISC Use two times a day as directed.     Marland Kitchen lisinopril (PRINIVIL,ZESTRIL) 40 MG tablet TAKE ONE TABLET ONCE DAILY 90 tablet 1  . meloxicam (MOBIC) 7.5 MG tablet TAKE ONE TABLET BY MOUTH ONCE DAILY 30 tablet 3  . sitaGLIPtin (JANUVIA) 100 MG tablet Take 1 tablet (100 mg total) by mouth daily. 30 tablet 3   Current Facility-Administered Medications  Medication Dose Route Frequency Provider Last Rate Last Dose  .  stroke: mapping our early stages of recovery book   Does not apply Once Luan Moore, MD      . acetaminophen (TYLENOL) tablet 650 mg  650 mg Oral Q4H PRN Luan Moore, MD       Or  . acetaminophen (TYLENOL) suppository 650 mg  650 mg Rectal Q4H PRN Luan Moore, MD      . aspirin EC tablet 81 mg  81 mg Oral Daily Amie Portland, MD   81 mg at 10/20/14 1124  . atorvastatin (LIPITOR) tablet 40 mg  40 mg Oral Daily Luan Moore, MD      . enoxaparin (LOVENOX) injection 40 mg  40 mg Subcutaneous Q24H Luan Moore, MD      . insulin aspart (novoLOG) injection 0-5 Units  0-5 Units Subcutaneous QHS Luan Moore, MD      . insulin aspart (novoLOG) injection 0-9 Units  0-9 Units Subcutaneous TID WC Luan Moore, MD      . insulin aspart protamine- aspart (NOVOLOG MIX 70/30) injection 10 Units  10 Units Subcutaneous BID WC Luan Moore, MD      . linagliptin (TRADJENTA) tablet 5 mg  5 mg Oral Daily Luan Moore, MD      . ondansetron  [facility]) injection 4 mg  4 mg Intravenous Q8H PRN Noemi Chapel, MD      . senna-docusate (Senokot-S) tablet 1 tablet  1 tablet Oral QHS PRN Luan Moore, MD        Past Medical History  Diagnosis Date  . Anemia     iron deficiency  . Diabetes mellitus     type II  . Hypertension   . Stroke   . MRSA (methicillin resistant Staphylococcus aureus)   . Diverticulosis of colon     Past Surgical History  Procedure Laterality Date  . Cataract extraction, bilateral    . Back surgery    . Shoulder surgery  08/2007    Supple    Allergies: Allergies as of 10/19/2014  . (No Known Allergies)    Family History  Problem Relation Age of Onset  . Diabetes      History   Social History  . Marital Status: Married    Spouse Name: N/A  . Number of Children: N/A  . Years of Education: N/A   Occupational History  . retired Paediatric nurse   Social History Main Topics  . Smoking status: Former Research scientist (life sciences)  . Smokeless tobacco: Not on file  . Alcohol Use: No  . Drug Use: No  . Sexual Activity: Not on file   Other Topics Concern  . Not on file   Social History Narrative   Review of Systems: All pertinent ROS as stated in HPI.   Physical Exam: Blood pressure 149/71, pulse 65, temperature 98.2 F (36.8 C), temperature source Oral, resp. rate 18, height 5\' 9"  (1.753 m), weight 197 lb 1.6 oz (89.404 kg), SpO2 98 %. General: resting in bed HEENT: PERRL, EOMI, no scleral icterus Cardiac: RRR, no rubs, murmurs or gallops Pulm: clear to auscultation bilaterally, moving normal volumes of air Abd: soft, nontender, nondistended, BS present Ext: warm and well perfused, no pedal edema Neuro: alert and oriented X3, diplopia reported in general  - cranial nerves II through XII intact  - motor: Strength 5+ throughout all 4 extremities  - Sensation: Intact to light touch throughout all 4 extremities  - Reflexes: 2+ deep tendon reflexes throughout all 4 extremities  - cerebellar: Finger to nose and  heel to shin intact, negative Romberg sign, no pronator drift  - Gait: Normal gait Skin: no rashes or lesions noted Psych: appropriate affect Lab results: Basic Metabolic Panel:  Recent Labs  10/20/14 0120  NA 141  K 3.7  CL 108  CO2 25  GLUCOSE 91  BUN 25*  CREATININE  1.19  CALCIUM 9.4   Liver Function Tests:  Recent Labs  10/20/14 0120  AST 20  ALT 15  ALKPHOS 94  BILITOT 0.4  PROT 7.8  ALBUMIN 4.1   No results for input(s): LIPASE, AMYLASE in the last 72 hours. No results for input(s): AMMONIA in the last 72 hours. CBC:  Recent Labs  10/20/14 0051  WBC 7.8  HGB 11.2*  HCT 35.1*  MCV 87.8  PLT 313   Cardiac Enzymes:  Recent Labs  10/20/14 0120  TROPONINI <0.03   BNP: No results for input(s): PROBNP in the last 72 hours. D-Dimer: No results for input(s): DDIMER in the last 72 hours. CBG: No results for input(s): GLUCAP in the last 72 hours. Hemoglobin A1C: No results for input(s): HGBA1C in the last 72 hours. Fasting Lipid Panel: No results for input(s): CHOL, HDL, LDLCALC, TRIG, CHOLHDL, LDLDIRECT in the last 72 hours. Thyroid Function Tests: No results for input(s): TSH, T4TOTAL, FREET4, T3FREE, THYROIDAB in the last 72 hours. Anemia Panel: No results for input(s): VITAMINB12, FOLATE, FERRITIN, TIBC, IRON, RETICCTPCT in the last 72 hours. Coagulation:  Recent Labs  10/20/14 0051  LABPROT 12.9  INR 0.96   Urine Drug Screen: Drugs of Abuse     Component Value Date/Time   LABOPIA NONE DETECTED 10/20/2014 0220   COCAINSCRNUR NONE DETECTED 10/20/2014 0220   LABBENZ NONE DETECTED 10/20/2014 0220   AMPHETMU NONE DETECTED 10/20/2014 0220   THCU NONE DETECTED 10/20/2014 0220   LABBARB NONE DETECTED 10/20/2014 0220    Alcohol Level:  Recent Labs  10/20/14 0120  ETH <5   Urinalysis:  Recent Labs  10/20/14 0220  COLORURINE YELLOW  LABSPEC 1.025  PHURINE 6.0  GLUCOSEU NEGATIVE  HGBUR NEGATIVE  BILIRUBINUR NEGATIVE  KETONESUR  NEGATIVE  PROTEINUR NEGATIVE  UROBILINOGEN 0.2  NITRITE NEGATIVE  LEUKOCYTESUR NEGATIVE   Misc. Labs:  Imaging results:  Ct Head Wo Contrast  10/20/2014   CLINICAL DATA:  Blurry vision for 3 days, worsening. Unsteady gait and confusion for 2 days. Diarrhea.  EXAM: CT HEAD WITHOUT CONTRAST  TECHNIQUE: Contiguous axial images were obtained from the base of the skull through the vertex without intravenous contrast.  COMPARISON:  MRI of the brain report March 04, 2000 though images are not available for direct comparison.  FINDINGS: The ventricles and sulci are normal for age. No intraparenchymal hemorrhage, mass effect nor midline shift. Patchy supratentorial white matter hypodensities are within normal range for patient's age and though non-specific suggest sequelae of chronic small vessel ischemic disease. No acute large vascular territory infarcts. Subcentimeter hypodensity mesial RIGHT thalamus.  No abnormal extra-axial fluid collections. Basal cisterns are patent. Mild calcific atherosclerosis of the carotid siphons.  No skull fracture. LEFT parietal scalp scarring. The included ocular globes and orbital contents are non-suspicious. Bilateral ocular lens implants. The mastoid aircells and included paranasal sinuses are well-aerated.  IMPRESSION: No acute intracranial process ; probable remote RIGHT medial thalamic lacunar infarct.   Electronically Signed   By: Elon Alas   On: 10/20/2014 03:00   Mr Jodene Nam Head Wo Contrast  10/20/2014   CLINICAL DATA:  Acute brainstem infarct. Unsteady gait and confusion.  EXAM: MRA HEAD WITHOUT CONTRAST  TECHNIQUE: Angiographic images of the Circle of Willis were obtained using MRA technique without intravenous contrast.  COMPARISON:  MRI brain without contrast 10/20/2014  FINDINGS: Moderate tortuosity is present within the cervical internal carotid arteries bilaterally without a significant stenosis. The A1 and M1 segments are normal. An azygos  A2 segment is  present. The MCA bifurcations are intact. There is moderate attenuation of MCA branch vessels on bilaterally.  The left vertebral artery is the dominant vessel. The right PICA origin is visualized and normal. The left AICA is dominant. The basilar artery is within normal limits. Both posterior cerebral arteries originate from the basilar tip. There is moderate attenuation of distal PCA branch vessels.  IMPRESSION: 1. Mild to moderate distal small vessel disease. 2. No significant proximal stenosis, aneurysm, or branch vessel occlusion.   Electronically Signed   By: San Morelle M.D.   On: 10/20/2014 12:57   Mr Brain Wo Contrast  10/20/2014   ADDENDUM REPORT: 10/20/2014 11:08  ADDENDUM: Voice recognition error: The first sentence of the impressions section should read "acute punctate nonhemorrhagic infarct within the left paramedian superior midbrain and inferomedial thalamus."   Electronically Signed   By: San Morelle M.D.   On: 10/20/2014 11:08   10/20/2014   CLINICAL DATA:  Difficulty walking and visual problems for 2 days. Personal history of stroke. Abnormal CT.  EXAM: MRI HEAD WITHOUT CONTRAST  TECHNIQUE: Multiplanar, multiecho pulse sequences of the brain and surrounding structures were obtained without intravenous contrast.  COMPARISON:  CT head from the same day.  FINDINGS: Acute punctate lacunar infarct is evident within the superior midbrain and inferomedial thalamus. The lesion at the genu of the right internal capsule is remote. There are multiple other remote lacunar infarcts involving the thalami bilaterally. Remote lacunar infarcts are present within the centrum semi of bowel IA, left greater than right. Periventricular white matter changes are noted bilaterally.  The susceptibility weighted images demonstrate 2 foci of remote hemorrhage in the midbrain and additional foci within the thalami bilaterally.  Flow is present in the major intracranial arteries. The patient is status  post bilateral lens replacements. The globes and orbits are otherwise intact. The paranasal sinuses and the mastoid air cells are clear. The skullbase is within normal limits.  IMPRESSION: 1. No acute punctate nonhemorrhagic infarct within the left paramedian superior midbrain and inferomedial thalamus. 2. Multiple other remote lacunar infarcts are evident within the thalami bilaterally. 3. Remote lacunar infarct of the genu of the left right internal capsule. 4. 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 5. Multiple foci of remote hemorrhage within the thalami a into lesions in the midbrain. This can be seen in the setting of hypertension or vasculitis. No acute hemorrhage is evident.  Electronically Signed: By: San Morelle M.D. On: 10/20/2014 08:02    Other results: EKG Interpretation  Date/Time:  Saturday October 20 2014 01:12:55 EST Ventricular Rate:  73 PR Interval:  191 QRS Duration: 85 QT Interval:  383 QTC Calculation: 422 R Axis:   47 Text Interpretation:  Sinus rhythm Abnormal R-wave progression, early transition Since last tracing 27 Feb 2000 T wave inversion no longer evident in Inferior leads Confirmed by KNAPP  MD-I, IVA ([id number]) on 10/20/2014 1:22:21 AM   Assessment & Plan by Problem: Active Problems:   DM type 2 (diabetes mellitus, type 2)   B12 deficiency   Hyperlipidemia   Iron deficiency anemia   Essential hypertension   Thalamic infarct, acute  Patient is a 74 year old gentleman with a history of type 2 diabetes, hypertension, CVA, diverticulosis, hyperlipidemia, iron deficiency anemia, B-12 deficiency, who presents to the hospital with an acute lacunar infarct.  Acute punctate lacunar infarct: Patient presenting with blurred vision as well as unsteady gait with onset yesterday afternoon. Patient has a  history of ischemic stroke with only mild paresthesia in his left hand as residual. CT with no acute intracranial process. MRI  showing an acute punctate lacunar infarcts within the superior midbrain and inferomedial thalamus. Patient is out of TPA window. Patient is reporting persistent diplopia at the time of interview and exam. -MRA pending -Hemoglobin A1c and fasting lipid panel -Echocardiogram -Carotid Dopplers -Continue aspirin 81 mg daily -Telemetry -Physical therapy, occupational therapy, speech -Permissive hypertension  Hypertension: Patient is on amlodipine 5 mg daily, and lisinopril 40 mg daily at home. -Permissive hypertension, hold home meds for now.  Type 2 diabetes:  Patient has a hemoglobin A1c of 8.8 from December 2016. At home, patient is on glipizide 10 mg twice a day, metformin 1000 mg twice a day, Januvia 100 mg daily, and Humulin 70/30 10 units twice a day. -SSI -Continue home Januvia 100 mg daily and 70/30 10 units twice a day -Hold metformin and glipizide combination pill for now.  Hyperlipidemia:  Patient is on atorvastatin 40 mg daily at home. -Continue home atorvastatin  Iron deficiency anemia: Admission hemoglobin of 11.2. Patient has a documented history of iron deficiency anemia. No iron studies found in our system. Patient is not currently on any iron supplementation. Only ferritin from 2014 within normal limits found. Patient has a colonoscopy from 2007 showing diverticulosis.  B-12 deficiency:  -Continue home B12  Diet: carb modified Prophylaxis: lovenox Code: Full  Dispo: Disposition is deferred at this time, awaiting improvement of current medical problems. Anticipated discharge in approximately 2 day(s).   The patient does have a current PCP (Timoteo Gaul, FNP) and does need an [facility] hospital follow-up appointment after discharge.  The patient does not have transportation limitations that hinder transportation to clinic appointments.  Signed: Luan Moore, M.D., Ph.D. Internal Medicine Teaching Service, PGY-1 10/20/2014, 3:04 PM

## 2014-10-20 NOTE — ED Notes (Signed)
Levada Dy, RN, 4N, aware pt is in MRA - advised she will ensure pt's room is ready and will be expecting pt when MRA is complete.

## 2014-10-21 DIAGNOSIS — E119 Type 2 diabetes mellitus without complications: Secondary | ICD-10-CM

## 2014-10-21 DIAGNOSIS — R2681 Unsteadiness on feet: Secondary | ICD-10-CM

## 2014-10-21 DIAGNOSIS — Z7982 Long term (current) use of aspirin: Secondary | ICD-10-CM

## 2014-10-21 DIAGNOSIS — I6381 Other cerebral infarction due to occlusion or stenosis of small artery: Secondary | ICD-10-CM | POA: Insufficient documentation

## 2014-10-21 DIAGNOSIS — I639 Cerebral infarction, unspecified: Secondary | ICD-10-CM

## 2014-10-21 DIAGNOSIS — I638 Other cerebral infarction: Secondary | ICD-10-CM

## 2014-10-21 DIAGNOSIS — D509 Iron deficiency anemia, unspecified: Secondary | ICD-10-CM

## 2014-10-21 DIAGNOSIS — E538 Deficiency of other specified B group vitamins: Secondary | ICD-10-CM

## 2014-10-21 DIAGNOSIS — Z794 Long term (current) use of insulin: Secondary | ICD-10-CM

## 2014-10-21 LAB — GLUCOSE, CAPILLARY
GLUCOSE-CAPILLARY: 116 mg/dL — AB (ref 70–99)
Glucose-Capillary: 175 mg/dL — ABNORMAL HIGH (ref 70–99)
Glucose-Capillary: 287 mg/dL — ABNORMAL HIGH (ref 70–99)
Glucose-Capillary: 177 mg/dL — ABNORMAL HIGH (ref 70–99)

## 2014-10-21 LAB — LIPID PANEL
CHOLESTEROL: 112 mg/dL (ref 0–200)
HDL: 39 mg/dL — ABNORMAL LOW (ref >39)
LDL Cholesterol: 61 mg/dL (ref 0–99)
TRIGLYCERIDES: 61 mg/dL (ref <150)
Total CHOL/HDL Ratio: 2.9 RATIO
VLDL: 12 mg/dL (ref 0–40)

## 2014-10-21 MED ORDER — ASPIRIN EC 81 MG PO TBEC
162.0000 mg | DELAYED_RELEASE_TABLET | Freq: Every day | ORAL | Status: DC
  Administered 2014-10-22: 162 mg via ORAL
  Filled 2014-10-21: qty 2

## 2014-10-21 NOTE — Evaluation (Signed)
Physical Therapy Evaluation and D/C Patient Details Name: Scott Vasquez MRN: [id number] DOB: May 26, 1941 Today's Date: 10/21/2014   History of Present Illness  Pt admit with thalamic infarct with visual change.   Clinical Impression  Pt admitted with above diagnosis. Pt currently without significant functional limitations and is functioning at independent level.  Visual change does not appear to be affecting his mobility and function at present.  Pt states his left hand was numb at baseline and there is no change there either.  Has cane and may use it prn per pt.  Pt will not need any further skilled PT at this time.  Will sign off.    Follow Up Recommendations No PT follow up    Equipment Recommendations  None recommended by PT    Recommendations for Other Services       Precautions / Restrictions Precautions Precautions: None Restrictions Weight Bearing Restrictions: No      Mobility  Bed Mobility Overal bed mobility: Independent                Transfers Overall transfer level: Independent                  Ambulation/Gait Ambulation/Gait assistance: Independent Ambulation Distance (Feet): 500 Feet Assistive device: None Gait Pattern/deviations: Step-through pattern   Gait velocity interpretation: <1.8 ft/sec, indicative of risk for recurrent falls General Gait Details: Pt ambulated with challenges without LOB.  No device warranted although pt states he may use cane until he returns fully to baseline.   Stairs Stairs: Yes Stairs assistance: Supervision Stair Management: One rail Right;Alternating pattern;Forwards Number of Stairs: 3 General stair comments: Pt had no difficulty ascending and descending steps.   Wheelchair Mobility    Modified Rankin (Stroke Patients Only)       Balance Overall balance assessment: No apparent balance deficits (not formally assessed)                               Standardized Balance  Assessment Standardized Balance Assessment : Dynamic Gait Index   Dynamic Gait Index Level Surface: Normal Change in Gait Speed: Normal Gait with Horizontal Head Turns: Normal Gait with Vertical Head Turns: Normal Gait and Pivot Turn: Normal Step Over Obstacle: Normal Step Around Obstacles: Normal Steps: Mild Impairment Total Score: 23       Pertinent Vitals/Pain Pain Assessment: No/denies pain  VSS    Home Living Family/patient expects to be discharged to:: Private residence Living Arrangements: Spouse/significant other Available Help at Discharge: Family;Available 24 hours/day Type of Home: House Home Access: Stairs to enter Entrance Stairs-Rails: Right Entrance Stairs-Number of Steps: 2 Home Layout: One level Home Equipment: Walker - 2 wheels;Cane - single point;Bedside commode      Prior Function Level of Independence: Independent         Comments: works at [employer] and drives.     Hand Dominance        Extremity/Trunk Assessment   Upper Extremity Assessment: Defer to OT evaluation           Lower Extremity Assessment: Overall WFL for tasks assessed      Cervical / Trunk Assessment: Normal  Communication   Communication: No difficulties  Cognition Arousal/Alertness: Awake/alert Behavior During Therapy: WFL for tasks assessed/performed Overall Cognitive Status: Within Functional Limits for tasks assessed                      General  Comments      Exercises        Assessment/Plan    PT Assessment Patent does not need any further PT services  PT Diagnosis Generalized weakness   PT Problem List    PT Treatment Interventions     PT Goals (Current goals can be found in the Care Plan section) Acute Rehab PT Goals Patient Stated Goal: to go home PT Goal Formulation: All assessment and education complete, DC therapy    Frequency     Barriers to discharge        Co-evaluation               End of Session Equipment  Utilized During Treatment: Gait belt Activity Tolerance: Patient tolerated treatment well Patient left: in chair;with call bell/phone within reach Nurse Communication: Mobility status         Time: 1151-1202 PT Time Calculation (min) (ACUTE ONLY): 11 min   Charges:   PT Evaluation $Initial PT Evaluation Tier I: 1 Procedure     PT G CodesDenice Paradise 2014/10/26, 1:47 PM [facility] [phone] (pager)

## 2014-10-21 NOTE — Progress Notes (Signed)
LOS: 1 day   Subjective: Still have diplopia but sates it's better. States its comes and goes, no diplopia this morning.   Objective: BP 138/73 mmHg  Pulse 74  Temp(Src) 98.1 F (36.7 C) (Oral)  Resp 16  Ht 5\' 9"  (1.753 m)  Wt 89.404 kg (197 lb 1.6 oz)  BMI 29.09 kg/m2  SpO2 97% No intake or output data in the 24 hours ending 10/21/14 1209  Physical Exam:   Physical Exam: General: healthy, cooperative, awake and alert, No acute distress Skin: Skin color, texture, turgor normal. No rashes or lesions. HEENT:  Head : Atraumatic, normocephalic. Nose: no nasal congestion.patent, no discharge Throat: No tonsillar erythema, exudates or enlargement. Mouth: Moist mucus, no lesions, dentition good. Neck: supple, no LAD Heart: Pulse regular rate and rhythm, normal S1S2, no murmurs appreciated.  Chest/ Lung: Normal depth and effort.  No tenderness to palpation Clear lungs sounds No rales, rhonchi, wheezing, or rubs. Abdomen: Soft, NTND, +BS, no hepatosplenomegaly noted.  Extremities: Warm and well perfused, no clubbing/cyanosis/edema. Radial, pedal pulse +2, no edema. Neuro: CN II: Visual fields are full to confrontation. Pupils are 3 mm and briskly reactive to light.  CN III, IV, VI: At primary gaze, there is no eye deviation.  CN V: Facial sensation is intact. CN VII: Face is symmetric with normal eye closure and smile. CN VII: Hearing is normal to rubbing fingers CN IX, X: Palate elevates symmetrically. Phonation is normal. CN XI: Head turning and shoulder shrug are intact CN XII: Tongue is midline with normal movements and no atrophy. Motor: There is no pronator drift of out-stretched arms. Muscle bulk and tone are normal. Strength is full all extremities bilaterally. Reflexes: Reflexes are 2+ and symmetric at the biceps, triceps, knees, and ankles. Sensory: Light touch are intact except some mild numbness to left hand.  Coordination: Rapid alternating movements and  fine finger movements are intact. There is no dysmetria on finger-to-nose and heel-knee-shin. There are no abnormal or extraneous movements. Romberg is absent. Gait: Posture is normal. Gait is steady with normal steps. Labs/Studies: I have reviewed labs and studies from last 24hrs per EMR.  Medications: I have reviewed the patient's current medications.  Assessment/Plan: Active Problems:   DM type 2 (diabetes mellitus, type 2)   B12 deficiency   Hyperlipidemia   Iron deficiency anemia   Essential hypertension   Thalamic infarct, acute   Acute lacunar stroke   Stroke with cerebral ischemia   Unsteady gait   Scott Vasquez is a 74 y.o. male that presented with   Scott Vasquez is a 74 y.o. male with PMHx of stroke with a residual of left hand numbness, HTN, DM II, Hyperlipidemia that presented with blurred vision and unsteadiness.   # Acute Ischemic CVA: pt with of an history of CVA and with risk factor of DM, hypertension and hyperlipidemia who presents today the ED for blurred vision an unsteadiness. CT of the head was negative for an acute event but MRI of brain showed an acute punctuated nonhemorrhagic infarct within the left paramedian superior midbrain and inferomedial thalamus. Pt still endorses horizontal diplopia but states that it is better and comes an goes. No diplopia this morning.   -appreciate neurology consult. -cardiac monitoring continuous.  -Echocardiogram pending.  -Carotid doppler pending.  -Physical therapy evaluation pending.  -Swallow evaluation -Q2 neuro check -continue Aspirin for prophylactic therapy. - # Diabetes Mellitus Type II: Last Hb A1c on 07/20/14=8.8. Pt takes Humulin 70/30 10 units BID,  Januvia 100 mg daily and glipizide-metformin 5-500mg  BID. Last CBG=116  -hold metformin -continue home Insuline70/30 -continue home Januvia -Sensitive SSI -Hb A1C pending.  -carb modified diet -CBG AC & HS   # Hypertension: Home regiment consists of  Lisinopril 40 mg daily, Amlodipine 5 mg daily, current BP is 144/73. Pt is positive for an acute Thalamic infarct, therefore we will allow for permissive hypertension. Last BP=138/73 -hold lisinopril -hold Amlodipine  - BP goal of SBP < 220  # Hyperlipidemia: Patient takes atorvastatin 40 mg daily at home. Lipid panel show slightly low HDL of 39.   -continue Atorvastatin 40 mg daily.   # Normocytic anemia: Pt Hb is 11.2 With MCV of 87.8 . Iron deficiency is noted in pt history but he is not taking any iron supplementation and there is no iron study in the system except normal Ferritin in 2014. Pt is actually taking vitamin B-12 supplementation 100 mc daily. Last Vit B-12 value on 03-23-14 was normal at 818 pg/ml.   - continue vitamin B-12 daily -consider obtaining iron study.    Diet: carb modified.  Prophylaxis: SCD and Lovenox   This is a Careers information officer Note. The care of the patient was discussed with Dr. Raelene Bott and the assessment and plan was formulated with their assistance. Please see their note for official documentation of the patient encounter.

## 2014-10-21 NOTE — Evaluation (Signed)
Speech Language Pathology Evaluation Patient Details Name: Mandy Fitzwater [place] MRN: [id number] DOB: 1940-10-28 Today's Date: 10/21/2014 Time: 1214-1227 SLP Time Calculation (min) (ACUTE ONLY): 13 min  Problem List:  Patient Active Problem List   Diagnosis Date Noted  . Acute lacunar stroke   . Stroke with cerebral ischemia   . Unsteady gait   . Thalamic infarct, acute 10/20/2014  . PERSONAL HX OF METHICILLIN RESIST STAPH AUREUS 09/17/2008  . DM type 2 (diabetes mellitus, type 2) 04/01/2007  . Hyperlipidemia 04/01/2007  . B12 deficiency 02/08/2007  . Iron deficiency anemia 02/08/2007  . Essential hypertension 02/08/2007  . DIVERTICULOSIS, COLON 02/08/2007  . CEREBROVASCULAR ACCIDENT, HX OF 02/08/2007   Past Medical History:  Past Medical History  Diagnosis Date  . Anemia     iron deficiency  . Diabetes mellitus     type II  . Hypertension   . Stroke   . MRSA (methicillin resistant Staphylococcus aureus)   . Diverticulosis of colon    Past Surgical History:  Past Surgical History  Procedure Laterality Date  . Cataract extraction, bilateral    . Back surgery    . Shoulder surgery  08/2007    Supple   HPI:  74 year old gentleman with a history of diabetes mellitus hypertension hyperlipidemia prior stroke who had developed blurry vision.   MRI  revealed an acute punctate nonhemorrhagic infarct within the left paramedian superior midbrain and inferomedial thalamus   Assessment / Plan / Recommendation Clinical Impression  Pt presents with no speech/language deficits; mild short-term memory deficit is baseline per pt report.  Exhibits good judgement/safety during assessment.  No SLP f/u needed.  Will sign off.    SLP Assessment  Patient does not need any further Speech Lanaguage Pathology Services    Follow Up Recommendations  None            Pertinent Vitals/Pain Pain Assessment: No/denies pain   SLP Goals     SLP Evaluation Prior Functioning  Cognitive/Linguistic  Baseline: Within functional limits  Lives With: Spouse   Cognition  Overall Cognitive Status: Within Functional Limits for tasks assessed Orientation Level: Oriented X4    Comprehension  Auditory Comprehension Overall Auditory Comprehension: Appears within functional limits for tasks assessed Visual Recognition/Discrimination Discrimination: Within Function Limits Reading Comprehension Reading Status: Not tested    Expression Expression Primary Mode of Expression: Verbal   Oral / Motor Oral Motor/Sensory Function Overall Oral Motor/Sensory Function: Appears within functional limits for tasks assessed Motor Speech Overall Motor Speech: Appears within functional limits for tasks assessed   Angelamarie Avakian L. Tivis Ringer, [place] CCC/SLP Pager [phone]      Juan Quam Laurice 10/21/2014, 12:32 PM

## 2014-10-21 NOTE — Plan of Care (Signed)
Problem: Progression Outcomes Goal: Progressive activity as tolerated Outcome: Completed/Met Date Met:  10/21/14 Patient checked off as independent to ambulate following PT consult
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
## 2014-10-21 NOTE — Progress Notes (Signed)
Pt states his diplopia has resolved considerably since earlier in the day and he can now see the television screen clearly.

## 2014-10-21 NOTE — Progress Notes (Signed)
STROKE TEAM PROGRESS NOTE   HISTORY Scott Vasquez is an 74 y.o. male with a past medical history significant for HTN, DM type II, ischemic stroke without residual deficits, sent to [facility] to get MRI to evaluate blurred vision and unsteadiness. Patient initially presented to [facility] with complains of bilateral blurred vision and unstable gait for the past 2 days. Denies associated HA, vertigo, double vision, difficulty swallowing, slurred speech, language impairment. Pt's wife notes pt has appeared dazed and confused. CT brain without acute abnormalityy, but MRI brain reviewed by myself showed a no acute punctate nonhemorrhagic infarct within the left paramedian superior midbrain and inferomedial thalamus.  Date last known well: unknown Time last known well: unknown tPA Given: no, out of the window.   SUBJECTIVE (INTERVAL HISTORY) The patient has his wife and multiple other family member at the bedside. He reports feeling much better and is probably back to baseline. He does tell me that he has residual numbness of the left hand from a stroke 10 years ago. The patient was on aspirin 325 previously but was reduced to 81 mg because of nosebleeds. He ambulated in the halls today with the physical therapist.  OBJECTIVE Temp:  [98 F (36.7 C)-98.4 F (36.9 C)] 98.2 F (36.8 C) (03/13 0400) Pulse Rate:  [59-68] 60 (03/13 0400) Cardiac Rhythm:  [-] Normal sinus rhythm;Heart block (03/12 2006) Resp:  [14-20] 14 (03/13 0400) BP: (128-149)/(64-78) 140/64 mmHg (03/13 0400) SpO2:  [96 %-100 %] 97 % (03/13 0400) Weight:  [89.404 kg (197 lb 1.6 oz)] 89.404 kg (197 lb 1.6 oz) (03/12 1217)   Recent Labs Lab 10/20/14 1606 10/20/14 2200 10/21/14 0647  GLUCAP 247* 109* 116*    Recent Labs Lab 10/20/14 0120  NA 141  K 3.7  CL 108  CO2 25  GLUCOSE 91  BUN 25*  CREATININE 1.19  CALCIUM 9.4    Recent Labs Lab 10/20/14 0120  AST 20  ALT 15  ALKPHOS 94  BILITOT 0.4  PROT 7.8  ALBUMIN 4.1     Recent Labs Lab 10/20/14 0051  WBC 7.8  HGB 11.2*  HCT 35.1*  MCV 87.8  PLT 313    Recent Labs Lab 10/20/14 0120  TROPONINI <0.03    Recent Labs  10/20/14 0051  LABPROT 12.9  INR 0.96    Recent Labs  10/20/14 0220  COLORURINE YELLOW  LABSPEC 1.025  PHURINE 6.0  GLUCOSEU NEGATIVE  HGBUR NEGATIVE  BILIRUBINUR NEGATIVE  KETONESUR NEGATIVE  PROTEINUR NEGATIVE  UROBILINOGEN 0.2  NITRITE NEGATIVE  LEUKOCYTESUR NEGATIVE       Component Value Date/Time   CHOL 121 07/20/2014 1114   TRIG 59.0 07/20/2014 1114   TRIG 31 07/16/2006 1037   HDL 49.50 07/20/2014 1114   CHOLHDL 2 07/20/2014 1114   CHOLHDL 2.5 CALC 07/16/2006 1037   VLDL 11.8 07/20/2014 1114   LDLCALC 60 07/20/2014 1114   Lab Results  Component Value Date   HGBA1C 8.8* 07/20/2014      Component Value Date/Time   LABOPIA NONE DETECTED 10/20/2014 0220   COCAINSCRNUR NONE DETECTED 10/20/2014 0220   LABBENZ NONE DETECTED 10/20/2014 0220   AMPHETMU NONE DETECTED 10/20/2014 0220   THCU NONE DETECTED 10/20/2014 0220   LABBARB NONE DETECTED 10/20/2014 0220     Recent Labs Lab 10/20/14 0120  ETH <5    Ct Head Wo Contrast 10/20/2014    No acute intracranial process ; probable remote RIGHT medial thalamic lacunar infarct.       Mr  Brain Wo Contrast 10/20/2014    1. Acute punctate nonhemorrhagic infarct within the left paramedian superior midbrain and inferomedial thalamus.  2. Multiple other remote lacunar infarcts are evident within the thalami bilaterally.  3. Remote lacunar infarct of the genu of the left right internal capsule.  4. 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  5. Multiple foci of remote hemorrhage within the thalami a into lesions in the midbrain. This can be seen in the setting of hypertension or vasculitis. No acute hemorrhage is evident.    Mr Jodene Nam Head Wo Contrast 10/20/2014    1. Mild to moderate distal small vessel disease.  2. No  significant proximal stenosis, aneurysm, or branch vessel occlusion.       PHYSICAL EXAM  GENERAL: Pleasant man in no acute distress.  HEENT: unremarkable.  ABDOMEN: soft  EXTREMITIES: No edema   BACK: unremarkable.  SKIN: Normal by inspection.    MENTAL STATUS: Alert and oriented. Speech, language and cognition are generally intact. Judgment and insight normal.   CRANIAL NERVES: Pupils are equal, round and reactive to light and accomodation; extra ocular movements are full, there is no significant nystagmus; visual fields are full; upper and lower facial muscles are normal in strength and symmetric, there is no flattening of the nasolabial folds; tongue is midline; uvula is midline; shoulder elevation is normal.  MOTOR: Normal tone, bulk and strength; no pronator drift.  COORDINATION: Left finger to nose is normal, right finger to nose is normal, No rest tremor; no intention tremor; no postural tremor; no bradykinesia.  SENSATION: Reduced to light touch left hand.   Brain MRI is reviewed in person. There is acute infarct involving the junction of the mid brain and thalamic area medial aspect on the left side. This is a lacunar infarct. There are multiple encephalomalacia small consistent with lacunar infarcts involving the thalami and deep white matter. There are more areas of decreased signal on subceptibility weighted sequence consistent with multiple aspects of the broken down blood products/micro-bleed. There is includes the basal ganglia bilaterally, thalami and pontine regions.  ASSESSMENT/PLAN Scott Vasquez is a 74 y.o. male with history of HTN, DM type II, ischemic stroke presenting with blurred vision, unsteadiness, and unstable gait.Marland Kitchen He did not receive IV t-PA due to late presentation.   Stroke:  Dominant possibly embolic from an unknown source.  Resultant  Gait ataxia  MRI  Acute punctate nonhemorrhagic infarct within the left paramedian superior midbrain and  inferomedial  thalamus.  MRA - Mild to moderate distal small vessel disease.   Carotid Doppler pending  2D Echo  pending  LDL 60  HgbA1c pending  Lovenox for VTE prophylaxis    carb modified with thin liquids  aspirin 81 mg orally every day prior to admission, now on aspirin 81 mg orally every day  Patient counseled to be compliant with his antithrombotic medications  Ongoing aggressive stroke risk factor management  Therapy recommendations: Pending  Disposition:  Pending  Multiple remote lacunar infarcts.  Multiple remote microhemorrhage. Likely complications of hypertension and less likely amyloid angiopathy.  Hypertension  Home meds:  Norvasc and lisinopril  Stable   Hyperlipidemia  Home meds: Lipitor 40 mg resumed in hospital  LDL 60, goal < 70  Continue statin at discharge  Diabetes  HgbA1c pending, goal < 7.0  Controlled  Other Stroke Risk Factors  Advanced age  Former cigarette smoker, quit   Obesity, Body mass index is 29.09 kg/(m^2).   Hx  stroke/TIA   Other Active Problems   mild anemia   Other Pertinent History     PLAN  Would increase aspirin to 162 mg daily- Two 81 mg tablets daily. ( 325 dose caused nosebleeds)      Hospital day # 1  Mikey Bussing PA-C [facility] Pager [phone] 10/21/2014, 8:27 AM     To contact Stroke Continuity provider, please refer to [email or web address] After hours, contact General Neurology

## 2014-10-21 NOTE — Progress Notes (Signed)
VASCULAR LAB PRELIMINARY  PRELIMINARY  PRELIMINARY  PRELIMINARY  Carotid Dopplers completed.    Preliminary report:  1-39% ICA stenosis.  Vertebral artery flow is antegrade.  There is a large, multi echoic structure, adjacent to the common carotid artery,  internal jugular and subclavian vein and artery. Etiology unknown.  Mai Longnecker, RVT 10/21/2014, 5:42 PM

## 2014-10-21 NOTE — Progress Notes (Signed)
Subjective:  Patient states that he is still having diplopia today. He stated that while watching the basketball game last night, he felt that he was watching two different televisions because of this. He states that he feels a bit more stable on his feet this morning and wonders if his unsteadiness is secondary to his vision changes.   Objective: Vital signs in last 24 hours: Filed Vitals:   10/20/14 2200 10/21/14 0000 10/21/14 0200 10/21/14 0400  BP: 145/75 132/77 128/74 140/64  Pulse: 65 67 59 60  Temp: 98.2 F (36.8 C) 98.1 F (36.7 C) 98 F (36.7 C) 98.2 F (36.8 C)  TempSrc: Oral Oral Oral Oral  Resp:  17 15 14   Height:      Weight:      SpO2: 100% 99% 96% 97%   Weight change: -2 lb 14.4 oz (-1.315 kg)  Intake/Output Summary (Last 24 hours) at 10/21/14 0733 Last data filed at 10/20/14 6440  Gross per 24 hour  Intake      0 ml  Output      0 ml  Net      0 ml    General: resting in bed HEENT: PERRL, EOMI, no scleral icterus Cardiac: RRR, no rubs, murmurs or gallops Pulm: clear to auscultation bilaterally, moving normal volumes of air Abd: soft, nontender, nondistended, BS present Ext: warm and well perfused, no pedal edema Neuro: alert and oriented X3  - cranial nerves II through XII intact  - motor: Strength 5+ throughout all 4 extremities  - Sensation: Intact to light touch throughout all 4 extremities  - Reflexes: 2+ deep tendon reflexes throughout all 4 extremities  - cerebellar: Finger to nose and heel to shin intact  - Gait: Normal gait Skin: no rashes or lesions noted Psych: appropriate affect  Lab Results: Basic Metabolic Panel:  Recent Labs Lab 10/20/14 0120  NA 141  K 3.7  CL 108  CO2 25  GLUCOSE 91  BUN 25*  CREATININE 1.19  CALCIUM 9.4   Liver Function Tests:  Recent Labs Lab 10/20/14 0120  AST 20  ALT 15  ALKPHOS 94  BILITOT 0.4  PROT 7.8  ALBUMIN 4.1   No results for input(s): LIPASE, AMYLASE in the last 168 hours. No  results for input(s): AMMONIA in the last 168 hours. CBC:  Recent Labs Lab 10/20/14 0051  WBC 7.8  HGB 11.2*  HCT 35.1*  MCV 87.8  PLT 313   Cardiac Enzymes:  Recent Labs Lab 10/20/14 0120  TROPONINI <0.03   BNP: No results for input(s): PROBNP in the last 168 hours. D-Dimer: No results for input(s): DDIMER in the last 168 hours. CBG:  Recent Labs Lab 10/20/14 1606 10/20/14 2200 10/21/14 0647  GLUCAP 247* 109* 116*   Hemoglobin A1C: No results for input(s): HGBA1C in the last 168 hours. Fasting Lipid Panel: No results for input(s): CHOL, HDL, LDLCALC, TRIG, CHOLHDL, LDLDIRECT in the last 168 hours. Thyroid Function Tests: No results for input(s): TSH, T4TOTAL, FREET4, T3FREE, THYROIDAB in the last 168 hours. Coagulation:  Recent Labs Lab 10/20/14 0051  LABPROT 12.9  INR 0.96   Anemia Panel: No results for input(s): VITAMINB12, FOLATE, FERRITIN, TIBC, IRON, RETICCTPCT in the last 168 hours. Urine Drug Screen: Drugs of Abuse     Component Value Date/Time   LABOPIA NONE DETECTED 10/20/2014 0220   COCAINSCRNUR NONE DETECTED 10/20/2014 0220   LABBENZ NONE DETECTED 10/20/2014 0220   AMPHETMU NONE DETECTED 10/20/2014 0220   THCU NONE  DETECTED 10/20/2014 0220   LABBARB NONE DETECTED 10/20/2014 0220    Alcohol Level:  Recent Labs Lab 10/20/14 0120  ETH <5   Urinalysis:  Recent Labs Lab 10/20/14 0220  COLORURINE YELLOW  LABSPEC 1.025  PHURINE 6.0  GLUCOSEU NEGATIVE  HGBUR NEGATIVE  BILIRUBINUR NEGATIVE  KETONESUR NEGATIVE  PROTEINUR NEGATIVE  UROBILINOGEN 0.2  NITRITE NEGATIVE  LEUKOCYTESUR NEGATIVE   Micro Results: No results found for this or any previous visit (from the past 240 hour(s)). Studies/Results: Ct Head Wo Contrast  10/20/2014   CLINICAL DATA:  Blurry vision for 3 days, worsening. Unsteady gait and confusion for 2 days. Diarrhea.  EXAM: CT HEAD WITHOUT CONTRAST  TECHNIQUE: Contiguous axial images were obtained from the base of  the skull through the vertex without intravenous contrast.  COMPARISON:  MRI of the brain report March 04, 2000 though images are not available for direct comparison.  FINDINGS: The ventricles and sulci are normal for age. No intraparenchymal hemorrhage, mass effect nor midline shift. Patchy supratentorial white matter hypodensities are within normal range for patient's age and though non-specific suggest sequelae of chronic small vessel ischemic disease. No acute large vascular territory infarcts. Subcentimeter hypodensity mesial RIGHT thalamus.  No abnormal extra-axial fluid collections. Basal cisterns are patent. Mild calcific atherosclerosis of the carotid siphons.  No skull fracture. LEFT parietal scalp scarring. The included ocular globes and orbital contents are non-suspicious. Bilateral ocular lens implants. The mastoid aircells and included paranasal sinuses are well-aerated.  IMPRESSION: No acute intracranial process ; probable remote RIGHT medial thalamic lacunar infarct.   Electronically Signed   By: Elon Alas   On: 10/20/2014 03:00   Mr Jodene Nam Head Wo Contrast  10/20/2014   CLINICAL DATA:  Acute brainstem infarct. Unsteady gait and confusion.  EXAM: MRA HEAD WITHOUT CONTRAST  TECHNIQUE: Angiographic images of the Circle of Willis were obtained using MRA technique without intravenous contrast.  COMPARISON:  MRI brain without contrast 10/20/2014  FINDINGS: Moderate tortuosity is present within the cervical internal carotid arteries bilaterally without a significant stenosis. The A1 and M1 segments are normal. An azygos A2 segment is present. The MCA bifurcations are intact. There is moderate attenuation of MCA branch vessels on bilaterally.  The left vertebral artery is the dominant vessel. The right PICA origin is visualized and normal. The left AICA is dominant. The basilar artery is within normal limits. Both posterior cerebral arteries originate from the basilar tip. There is moderate  attenuation of distal PCA branch vessels.  IMPRESSION: 1. Mild to moderate distal small vessel disease. 2. No significant proximal stenosis, aneurysm, or branch vessel occlusion.   Electronically Signed   By: San Morelle M.D.   On: 10/20/2014 12:57   Mr Brain Wo Contrast  10/20/2014   ADDENDUM REPORT: 10/20/2014 11:08  ADDENDUM: Voice recognition error: The first sentence of the impressions section should read "acute punctate nonhemorrhagic infarct within the left paramedian superior midbrain and inferomedial thalamus."   Electronically Signed   By: San Morelle M.D.   On: 10/20/2014 11:08   10/20/2014   CLINICAL DATA:  Difficulty walking and visual problems for 2 days. Personal history of stroke. Abnormal CT.  EXAM: MRI HEAD WITHOUT CONTRAST  TECHNIQUE: Multiplanar, multiecho pulse sequences of the brain and surrounding structures were obtained without intravenous contrast.  COMPARISON:  CT head from the same day.  FINDINGS: Acute punctate lacunar infarct is evident within the superior midbrain and inferomedial thalamus. The lesion at the genu of the right internal capsule  is remote. There are multiple other remote lacunar infarcts involving the thalami bilaterally. Remote lacunar infarcts are present within the centrum semi of bowel IA, left greater than right. Periventricular white matter changes are noted bilaterally.  The susceptibility weighted images demonstrate 2 foci of remote hemorrhage in the midbrain and additional foci within the thalami bilaterally.  Flow is present in the major intracranial arteries. The patient is status post bilateral lens replacements. The globes and orbits are otherwise intact. The paranasal sinuses and the mastoid air cells are clear. The skullbase is within normal limits.  IMPRESSION: 1. No acute punctate nonhemorrhagic infarct within the left paramedian superior midbrain and inferomedial thalamus. 2. Multiple other remote lacunar infarcts are evident within  the thalami bilaterally. 3. Remote lacunar infarct of the genu of the left right internal capsule. 4. 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 5. Multiple foci of remote hemorrhage within the thalami a into lesions in the midbrain. This can be seen in the setting of hypertension or vasculitis. No acute hemorrhage is evident.  Electronically Signed: By: San Morelle M.D. On: 10/20/2014 08:02   Medications: I have reviewed the patient's current medications. Scheduled Meds: . aspirin EC  81 mg Oral Daily  . atorvastatin  40 mg Oral Daily  . enoxaparin (LOVENOX) injection  40 mg Subcutaneous Q24H  . insulin aspart  0-5 Units Subcutaneous QHS  . insulin aspart  0-9 Units Subcutaneous TID WC  . insulin aspart protamine- aspart  10 Units Subcutaneous BID WC  . linagliptin  5 mg Oral Daily  . cyanocobalamin  100 mcg Oral Daily   Continuous Infusions:  PRN Meds:.acetaminophen **OR** acetaminophen, senna-docusate Assessment/Plan: Active Problems:   DM type 2 (diabetes mellitus, type 2)   B12 deficiency   Hyperlipidemia   Iron deficiency anemia   Essential hypertension   Thalamic infarct, acute  Patient is a 74 year old gentleman with a history of type 2 diabetes, hypertension, CVA, diverticulosis, hyperlipidemia, iron deficiency anemia, B-12 deficiency, who presents to the hospital with an acute lacunar infarct.  Acute punctate lacunar infarct: Patient reporting that his blurred vision is stable though his unsteady gait is improved. MRI showing an acute punctate lacunar infarcts within the superior midbrain and inferomedial thalamus.  -MRA with no evidence of proximal stenosis, aneurysms, or branch vessel occlusions -Hemoglobin A1c and fasting lipid panel  -Echocardiogram -Carotid Dopplers -Continue aspirin 81 mg daily -Telemetry -Physical therapy, occupational therapy, speech -Permissive hypertension  Hypertension: Blood pressures have been  within a normal range off home anti-hypertensives this morning. Patient is on amlodipine 5 mg daily, and lisinopril 40 mg daily at home. -Will continue holding home medications given normotension.  Type 2 diabetes: Patient has remained normyglycemic since admission. Patient has a hemoglobin A1c of 8.8 from December 2016. At home, patient is on glipizide 10 mg twice a day, metformin 1000 mg twice a day, Januvia 100 mg daily, and Humulin 70/30 10 units twice a day. -SSI -Continue home Januvia 100 mg daily and 70/30 10 units twice a day -Hold metformin and glipizide combination pill for now.  Hyperlipidemia: Patient is on atorvastatin 40 mg daily at home. -Continue home atorvastatin  Iron deficiency anemia: Admission hemoglobin of 11.2. Patient has a documented history of iron deficiency anemia. No iron studies found in our system. Patient is not currently on any iron supplementation. Only ferritin from 2014 within normal limits found. Patient has a colonoscopy from 2007 showing diverticulosis.  B-12 deficiency:  -Continue home B12  Diet: carb modified Prophylaxis: lovenox Code: Full  Dispo: Disposition is deferred at this time, awaiting improvement of current medical problems. Anticipated discharge in approximately 2 day(s).   The patient does have a current PCP (Timoteo Gaul, FNP) and does need an [facility] hospital follow-up appointment after discharge.  The patient does not have transportation limitations that hinder transportation to clinic appointments.   LOS: 1 day   Services Needed at time of discharge: Y = Yes, Blank = No PT:   OT:   RN:   Equipment:   Other:    Luan Moore, MD 10/21/2014, 7:33 AM

## 2014-10-22 ENCOUNTER — Encounter (HOSPITAL_COMMUNITY): Payer: Self-pay | Admitting: Radiology

## 2014-10-22 ENCOUNTER — Inpatient Hospital Stay (HOSPITAL_COMMUNITY): Payer: BLUE CROSS/BLUE SHIELD

## 2014-10-22 DIAGNOSIS — E119 Type 2 diabetes mellitus without complications: Secondary | ICD-10-CM | POA: Insufficient documentation

## 2014-10-22 DIAGNOSIS — I6789 Other cerebrovascular disease: Secondary | ICD-10-CM

## 2014-10-22 DIAGNOSIS — L989 Disorder of the skin and subcutaneous tissue, unspecified: Secondary | ICD-10-CM | POA: Insufficient documentation

## 2014-10-22 DIAGNOSIS — E1159 Type 2 diabetes mellitus with other circulatory complications: Secondary | ICD-10-CM | POA: Insufficient documentation

## 2014-10-22 DIAGNOSIS — E041 Nontoxic single thyroid nodule: Secondary | ICD-10-CM

## 2014-10-22 DIAGNOSIS — I10 Essential (primary) hypertension: Secondary | ICD-10-CM

## 2014-10-22 DIAGNOSIS — E1165 Type 2 diabetes mellitus with hyperglycemia: Secondary | ICD-10-CM | POA: Insufficient documentation

## 2014-10-22 DIAGNOSIS — E118 Type 2 diabetes mellitus with unspecified complications: Secondary | ICD-10-CM

## 2014-10-22 DIAGNOSIS — E785 Hyperlipidemia, unspecified: Secondary | ICD-10-CM

## 2014-10-22 DIAGNOSIS — R221 Localized swelling, mass and lump, neck: Secondary | ICD-10-CM

## 2014-10-22 LAB — GLUCOSE, CAPILLARY
GLUCOSE-CAPILLARY: 147 mg/dL — AB (ref 70–99)
GLUCOSE-CAPILLARY: 168 mg/dL — AB (ref 70–99)
Glucose-Capillary: 197 mg/dL — ABNORMAL HIGH (ref 70–99)
Glucose-Capillary: 122 mg/dL — ABNORMAL HIGH (ref 70–99)

## 2014-10-22 LAB — IRON AND TIBC
Iron: 92 ug/dL (ref 42–165)
Saturation Ratios: 33 % (ref 20–55)
TIBC: 282 ug/dL (ref 215–435)
UIBC: 190 ug/dL (ref 125–400)

## 2014-10-22 LAB — VITAMIN B12: Vitamin B-12: 728 pg/mL (ref 211–911)

## 2014-10-22 LAB — RETICULOCYTES
RBC.: 4.13 MIL/uL — AB (ref 4.22–5.81)
RETIC COUNT ABSOLUTE: 28.9 10*3/uL (ref 19.0–186.0)
Retic Ct Pct: 0.7 % (ref 0.4–3.1)

## 2014-10-22 LAB — FOLATE: Folate: 12.5 ng/mL

## 2014-10-22 LAB — HEMOGLOBIN A1C
HEMOGLOBIN A1C: 7.7 % — AB (ref 4.8–5.6)
Hgb A1c MFr Bld: 7.7 % — ABNORMAL HIGH (ref 4.8–5.6)
Mean Plasma Glucose: 174 mg/dL
Mean Plasma Glucose: 174 mg/dL

## 2014-10-22 LAB — FERRITIN: FERRITIN: 146 ng/mL (ref 22–322)

## 2014-10-22 MED ORDER — IOHEXOL 300 MG/ML  SOLN
75.0000 mL | Freq: Once | INTRAMUSCULAR | Status: AC | PRN
  Administered 2014-10-22: 75 mL via INTRAVENOUS

## 2014-10-22 NOTE — Progress Notes (Signed)
*  PRELIMINARY RESULTS* Echocardiogram 2D Echocardiogram has been performed.  Scott Vasquez 10/22/2014, 11:19 AM

## 2014-10-22 NOTE — Progress Notes (Signed)
STROKE TEAM PROGRESS NOTE   HISTORY Scott Vasquez is an 74 y.o. male with a past medical history significant for HTN, DM type II, ischemic stroke without residual deficits, sent to [facility] to get MRI to evaluate blurred vision and unsteadiness. Patient initially presented to [facility] with complains of bilateral blurred vision and unstable gait for the past 2 days. Denies associated HA, vertigo, double vision, difficulty swallowing, slurred speech, language impairment. Pt's wife notes pt has appeared dazed and confused. CT brain without acute abnormalityy, but MRI brain reviewed by myself showed a no acute punctate nonhemorrhagic infarct within the left paramedian superior midbrain and inferomedial thalamus.  Date last known well: unknown Time last known well: unknown tPA Given: no, out of the window.   SUBJECTIVE (INTERVAL HISTORY) Wife at bedside. His symptoms improved some, no more double vision. Stated that he was on plavix in the past then has nosebleed every week. Changed to ASA 325mg  nosebleed once a day. Changed to ASA 81mg  currently and no more nosebleed.   OBJECTIVE Temp:  [98.2 F (36.8 C)-99 F (37.2 C)] 98.2 F (36.8 C) (03/14 0929) Pulse Rate:  [64-84] 83 (03/14 0929) Cardiac Rhythm:  [-] Normal sinus rhythm;Heart block (03/14 0600) Resp:  [16-20] 20 (03/14 0929) BP: (121-155)/(67-74) 121/71 mmHg (03/14 0929) SpO2:  [96 %-98 %] 97 % (03/14 0929)   Recent Labs Lab 10/21/14 0647 10/21/14 1106 10/21/14 1610 10/21/14 2223 10/22/14 0641  GLUCAP 116* 175* 287* 177* 147*    Recent Labs Lab 10/20/14 0120  NA 141  K 3.7  CL 108  CO2 25  GLUCOSE 91  BUN 25*  CREATININE 1.19  CALCIUM 9.4    Recent Labs Lab 10/20/14 0120  AST 20  ALT 15  ALKPHOS 94  BILITOT 0.4  PROT 7.8  ALBUMIN 4.1    Recent Labs Lab 10/20/14 0051  WBC 7.8  HGB 11.2*  HCT 35.1*  MCV 87.8  PLT 313    Recent Labs Lab 10/20/14 0120  TROPONINI <0.03    Recent Labs   10/20/14 0051  LABPROT 12.9  INR 0.96    Recent Labs  10/20/14 0220  COLORURINE YELLOW  LABSPEC 1.025  PHURINE 6.0  GLUCOSEU NEGATIVE  HGBUR NEGATIVE  BILIRUBINUR NEGATIVE  KETONESUR NEGATIVE  PROTEINUR NEGATIVE  UROBILINOGEN 0.2  NITRITE NEGATIVE  LEUKOCYTESUR NEGATIVE       Component Value Date/Time   CHOL 112 10/21/2014 0627   TRIG 61 10/21/2014 0627   TRIG 31 07/16/2006 1037   HDL 39* 10/21/2014 0627   CHOLHDL 2.9 10/21/2014 0627   CHOLHDL 2.5 CALC 07/16/2006 1037   VLDL 12 10/21/2014 0627   LDLCALC 61 10/21/2014 0627   Lab Results  Component Value Date   HGBA1C 7.7* 10/21/2014      Component Value Date/Time   LABOPIA NONE DETECTED 10/20/2014 0220   COCAINSCRNUR NONE DETECTED 10/20/2014 0220   LABBENZ NONE DETECTED 10/20/2014 0220   AMPHETMU NONE DETECTED 10/20/2014 0220   THCU NONE DETECTED 10/20/2014 0220   LABBARB NONE DETECTED 10/20/2014 0220     Recent Labs Lab 10/20/14 0120  ETH <5    Ct Head Wo Contrast 10/20/2014    No acute intracranial process ; probable remote RIGHT medial thalamic lacunar infarct.      Scott Brain Wo Contrast 10/20/2014    1. Acute punctate nonhemorrhagic infarct within the left paramedian superior midbrain and inferomedial thalamus.  2. Multiple other remote lacunar infarcts are evident within the thalami bilaterally.  3. Remote  lacunar infarct of the genu of the left right internal capsule.  4. 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  5. Multiple foci of remote hemorrhage within the thalami a into lesions in the midbrain. This can be seen in the setting of hypertension or vasculitis. No acute hemorrhage is evident.    Scott Vasquez Head Wo Contrast 10/20/2014    1. Mild to moderate distal small vessel disease.  2. No significant proximal stenosis, aneurysm, or branch vessel occlusion.     Carotid Doppler 1-39% ICA stenosis. Vertebral artery flow is antegrade. There is a large, multi  echoic structure, adjacent to the common carotid artery, internal jugular and subclavian vein and artery. Etiology unknown.   2D Echocardiogram  EF 60-65% with no source of embolus.   CT neck - 4.9 cm left thyroid nodule, likely corresponding to the sonographic Abnormality. Consider follow-up thyroid ultrasound as an outpatient, with biopsy as clinically warranted.  PHYSICAL EXAM GENERAL: Pleasant man in no acute distress. HEENT: unremarkable. ABDOMEN: soft EXTREMITIES: No edema  BACK: unremarkable. SKIN: Normal by inspection. MENTAL STATUS: Alert and oriented. Speech, language and cognition are generally intact. Judgment and insight normal.  CRANIAL NERVES: Pupils are equal, round and reactive to light and accomodation; extra ocular movements are full, there is no significant nystagmus; visual fields are full, no double vision; right facial droop; tongue is midline; uvula is midline; shoulder elevation is normal. MOTOR: Normal tone, bulk and strength; no pronator drift. COORDINATION: Left finger to nose is normal, right finger to nose is normal, No rest tremor; no intention tremor; no postural tremor; no bradykinesia. SENSATION: Reduced to light touch left hand. GAIT - not tested.    ASSESSMENT/PLAN Scott. Scott Vasquez is a 74 y.o. male with history of HTN, DM type II, ischemic stroke presenting with blurred vision, unsteadiness, and unstable gait.Marland Kitchen He did not receive IV t-PA due to late presentation.   Stroke:  L midbrain and thalamic infarct secondary to small vessel disease source.  Resultant  Gait ataxia  MRI  Acute punctate nonhemorrhagic infarct within the left paramedian superior midbrain and inferomedial thalamus.  MRA - Mild to moderate distal small vessel disease.   Carotid Doppler No significant stenosis   2D Echo No source of embolus   Lovenox for VTE prophylaxis    carb modified with thin liquids  aspirin 81 mg orally every day prior to admission, now on aspirin  162 mg orally every day (pt had nosebleeds on ASA 325 mg daily as well as on Plavix per pt). Given stroke on aspirin, recommend changing aspirin to plavix 75 mg daily. Patient agreeable. Nosebleeds were not as frequent on plavix as they were on aspirin. Can always stop plavix should bleeds recur. If concern for GI bleeding, may leave on aspirin as Plavix more associated with GI bleeds.  Ongoing aggressive stroke risk factor management  Therapy recommendations: no therapy needs  Disposition:  Return home  Hypertension  Home meds:  Norvasc and lisinopril  Stable  Hyperlipidemia  Home meds: Lipitor 40 mg resumed in hospital  LDL 60, goal < 70  Continue statin at discharge  Diabetes  HgbA1c 7.7, goal < 7.0  DM education  Management as per primary team  On novolog 70/30  SSI  Thyroid nodule  Follow up as outpt  Other Stroke Risk Factors  Advanced age  Former cigarette smoker, quit   Obesity, Body mass index is 29.09 kg/(m^2).   Hx stroke/TIA  Other Active Problems  mild anemia and B-12 deficiency may need colonoscopy  Medical carotid, question etiology  Hospital day # [place] for Pager information 10/22/2014 10:48 AM   74 yo M with hx of HTN, DM, CVA 10 years ago was admitted for double vision, unsteady. MRI showed left midbrain and thalamic stroke, consistent with small vessel disease. Pt failed ASA therapy, recommend to switch to plavix. If nose bleed recurrent, then consider to switch back. Pt agreeable. A1C 7.7, not in good control. Continue lipitor for HLD and stroke prevention.  Neurology will sign off. Please call with questions. Pt will follow up with Dr. Erlinda Hong at [facility] in about 2 months. Thanks for the consult.  Rosalin Hawking, MD PhD Stroke Neurology 10/23/2014 12:26 AM   To contact Stroke Continuity provider, please refer to [email or web address] After hours, contact General Neurology

## 2014-10-22 NOTE — Progress Notes (Signed)
Occupational Therapy Evaluation Patient Details Name: Scott Vasquez MRN: [id number] DOB: Apr 23, 1941 Today's Date: 10/22/2014    History of Present Illness Pt admit with thalamic infarct with visual change.    Clinical Impression   PTA, pt independent with ADL and mobility and worked at [employer]. Pt feels he is close to his baseline. Pt only c/o diplopia with near vision @ 6 inches from face. Noted R eye does not converge on target using binocular vision - only when L eye is occluded. Pt given HEP. Feel pt safe to D/C home with intermittent S when medically stable.     Follow Up Recommendations  No OT follow up;Supervision - Intermittent    Equipment Recommendations  None recommended by OT    Recommendations for Other Services       Precautions / Restrictions Precautions Precautions: None Restrictions Weight Bearing Restrictions: No      Mobility Bed Mobility Overal bed mobility: Independent                Transfers Overall transfer level: Independent                    Balance Overall balance assessment: No apparent balance deficits (not formally assessed)                                          ADL Overall ADL's : At baseline      No LOB during ADL. No overshooting/undershooting during ADL.                                        Vision Vision Assessment?: Yes Eye Alignment: Within Functional Limits Ocular Range of Motion: Within Functional Limits Alignment/Gaze Preference: Within Defined Limits Tracking/Visual Pursuits: Able to track stimulus in all quads without difficulty Saccades: Within functional limits Convergence: Impaired (comment) Visual Fields: No apparent deficits Diplopia Assessment: Objects split side to side;Present in near gaze Additional Comments: Pt reprots he had horizontal diplopia at all times PTA. Now diplopia appears to have resolved except with near vision @ 6 inches from nose. R eye  does not converge on target with binocular vision, only with L eye occluded. Pt given activities to complete to improve convergence   Perception     Praxis Praxis Praxis tested?: Within functional limits    Pertinent Vitals/Pain Pain Assessment: No/denies pain     Hand Dominance Right   Extremity/Trunk Assessment Upper Extremity Assessment Upper Extremity Assessment: Overall WFL for tasks assessed   Lower Extremity Assessment Lower Extremity Assessment: Overall WFL for tasks assessed   Cervical / Trunk Assessment Cervical / Trunk Assessment: Normal   Communication Communication Communication: No difficulties   Cognition Arousal/Alertness: Awake/alert Behavior During Therapy: WFL for tasks assessed/performed Overall Cognitive Status: Within Functional Limits for tasks assessed (impaired judgement? baseline)  ? Baseline cognition - pt drove while experiencing diplopia                   General Comments       Exercises Exercises: Other exercises Other Exercises Other Exercises: convergence activities   Shoulder Instructions      Home Living Family/patient expects to be discharged to:: Private residence Living Arrangements: Spouse/significant other Available Help at Discharge: Family;Available 24 hours/day Type of Home: House Home Access: Stairs  to enter Entrance Stairs-Number of Steps: 2 Entrance Stairs-Rails: Right Home Layout: One level     Bathroom Shower/Tub: Tub/shower unit Shower/tub characteristics: Architectural technologist: Standard Bathroom Accessibility: Yes How Accessible: Accessible via walker Home Equipment: Sublimity - 2 wheels;Cane - single point;Bedside commode      Lives With: Spouse    Prior Functioning/Environment Level of Independence: Independent        Comments: works at [employer] and drives.    OT Diagnosis: Disturbance of vision   OT Problem List: Impaired vision/perception;Decreased safety awareness   OT  Treatment/Interventions:      OT Goals(Current goals can be found in the care plan section) Acute Rehab OT Goals Patient Stated Goal: to go home OT Goal Formulation: All assessment and education complete, DC therapy  OT Frequency:     Barriers to D/C:            Co-evaluation              End of Session Nurse Communication: Mobility status  Activity Tolerance: Patient tolerated treatment well Patient left: in bed;with call bell/phone within reach;with family/visitor present   Time: 1130-1154 OT Time Calculation (min): 24 min Charges:  OT General Charges $OT Visit: 1 Procedure OT Evaluation $Initial OT Evaluation Tier I: 1 Procedure OT Treatments $Self Care/Home Management : 8-22 mins G-Codes:    Chyanna Flock,HILLARY 10-Nov-2014, 2:40 PM [facility], OTR/L  [phone] 11/10/14

## 2014-10-22 NOTE — Progress Notes (Deleted)
Paged [facility] to inform them that patient refuses to wear SCD's but has been ambulatory all day.

## 2014-10-22 NOTE — Progress Notes (Signed)
Subjective:  Patient is reporting that he is feeling well this morning. He denies any persistent diplopia over the last 24 hours. Patient is otherwise not reporting any complaints.  Objective: Vital signs in last 24 hours: Filed Vitals:   10/21/14 1711 10/21/14 2220 10/22/14 0134 10/22/14 0516  BP: 155/74 136/71 122/68 129/73  Pulse: 84 72 64 64  Temp: 98.2 F (36.8 C) 99 F (37.2 C) 98.6 F (37 C) 98.2 F (36.8 C)  TempSrc: Oral Oral Oral Oral  Resp: 16 20 20 20   Height:      Weight:      SpO2: 96% 97% 98% 98%   Weight change:  No intake or output data in the 24 hours ending 10/22/14 0713  General: Sitting comfortably on the edge of bed HEENT: PERRL, EOMI, no scleral icterus Cardiac: RRR, no rubs, murmurs or gallops Pulm: clear to auscultation bilaterally, moving normal volumes of air Abd: soft, nontender, nondistended, BS present Ext: warm and well perfused, no pedal edema Neuro: alert and oriented X3  - cranial nerves II through XII intact  - motor: Strength 5+ throughout all 4 extremities  - Sensation: Intact to light touch throughout all 4 extremities  - Gait: Normal gait Skin: no rashes or lesions noted Psych: appropriate affect  Lab Results: Basic Metabolic Panel:  Recent Labs Lab 10/20/14 0120  NA 141  K 3.7  CL 108  CO2 25  GLUCOSE 91  BUN 25*  CREATININE 1.19  CALCIUM 9.4   Liver Function Tests:  Recent Labs Lab 10/20/14 0120  AST 20  ALT 15  ALKPHOS 94  BILITOT 0.4  PROT 7.8  ALBUMIN 4.1   No results for input(s): LIPASE, AMYLASE in the last 168 hours. No results for input(s): AMMONIA in the last 168 hours. CBC:  Recent Labs Lab 10/20/14 0051  WBC 7.8  HGB 11.2*  HCT 35.1*  MCV 87.8  PLT 313   Cardiac Enzymes:  Recent Labs Lab 10/20/14 0120  TROPONINI <0.03   BNP: No results for input(s): PROBNP in the last 168 hours. D-Dimer: No results for input(s): DDIMER in the last 168 hours. CBG:  Recent Labs Lab  10/20/14 2200 10/21/14 0647 10/21/14 1106 10/21/14 1610 10/21/14 2223 10/22/14 0641  GLUCAP 109* 116* 175* 287* 177* 147*   Hemoglobin A1C: No results for input(s): HGBA1C in the last 168 hours. Fasting Lipid Panel:  Recent Labs Lab 10/21/14 0627  CHOL 112  HDL 39*  LDLCALC 61  TRIG 61  CHOLHDL 2.9   Thyroid Function Tests: No results for input(s): TSH, T4TOTAL, FREET4, T3FREE, THYROIDAB in the last 168 hours. Coagulation:  Recent Labs Lab 10/20/14 0051  LABPROT 12.9  INR 0.96   Anemia Panel:  Recent Labs Lab 10/21/14 1845  VITAMINB12 728   Urine Drug Screen: Drugs of Abuse     Component Value Date/Time   LABOPIA NONE DETECTED 10/20/2014 0220   COCAINSCRNUR NONE DETECTED 10/20/2014 0220   LABBENZ NONE DETECTED 10/20/2014 0220   AMPHETMU NONE DETECTED 10/20/2014 0220   THCU NONE DETECTED 10/20/2014 0220   LABBARB NONE DETECTED 10/20/2014 0220    Alcohol Level:  Recent Labs Lab 10/20/14 0120  ETH <5   Urinalysis:  Recent Labs Lab 10/20/14 0220  COLORURINE YELLOW  LABSPEC 1.025  PHURINE 6.0  GLUCOSEU NEGATIVE  HGBUR NEGATIVE  BILIRUBINUR NEGATIVE  KETONESUR NEGATIVE  PROTEINUR NEGATIVE  UROBILINOGEN 0.2  NITRITE NEGATIVE  LEUKOCYTESUR NEGATIVE   Micro Results: No results found for this or any  previous visit (from the past 240 hour(s)). Studies/Results: Mr Virgel Paling Wo Contrast  10/20/2014   CLINICAL DATA:  Acute brainstem infarct. Unsteady gait and confusion.  EXAM: MRA HEAD WITHOUT CONTRAST  TECHNIQUE: Angiographic images of the Circle of Willis were obtained using MRA technique without intravenous contrast.  COMPARISON:  MRI brain without contrast 10/20/2014  FINDINGS: Moderate tortuosity is present within the cervical internal carotid arteries bilaterally without a significant stenosis. The A1 and M1 segments are normal. An azygos A2 segment is present. The MCA bifurcations are intact. There is moderate attenuation of MCA branch vessels on  bilaterally.  The left vertebral artery is the dominant vessel. The right PICA origin is visualized and normal. The left AICA is dominant. The basilar artery is within normal limits. Both posterior cerebral arteries originate from the basilar tip. There is moderate attenuation of distal PCA branch vessels.  IMPRESSION: 1. Mild to moderate distal small vessel disease. 2. No significant proximal stenosis, aneurysm, or branch vessel occlusion.   Electronically Signed   By: San Morelle M.D.   On: 10/20/2014 12:57   Mr Brain Wo Contrast  10/20/2014   ADDENDUM REPORT: 10/20/2014 11:08  ADDENDUM: Voice recognition error: The first sentence of the impressions section should read "acute punctate nonhemorrhagic infarct within the left paramedian superior midbrain and inferomedial thalamus."   Electronically Signed   By: San Morelle M.D.   On: 10/20/2014 11:08   10/20/2014   CLINICAL DATA:  Difficulty walking and visual problems for 2 days. Personal history of stroke. Abnormal CT.  EXAM: MRI HEAD WITHOUT CONTRAST  TECHNIQUE: Multiplanar, multiecho pulse sequences of the brain and surrounding structures were obtained without intravenous contrast.  COMPARISON:  CT head from the same day.  FINDINGS: Acute punctate lacunar infarct is evident within the superior midbrain and inferomedial thalamus. The lesion at the genu of the right internal capsule is remote. There are multiple other remote lacunar infarcts involving the thalami bilaterally. Remote lacunar infarcts are present within the centrum semi of bowel IA, left greater than right. Periventricular white matter changes are noted bilaterally.  The susceptibility weighted images demonstrate 2 foci of remote hemorrhage in the midbrain and additional foci within the thalami bilaterally.  Flow is present in the major intracranial arteries. The patient is status post bilateral lens replacements. The globes and orbits are otherwise intact. The paranasal sinuses  and the mastoid air cells are clear. The skullbase is within normal limits.  IMPRESSION: 1. No acute punctate nonhemorrhagic infarct within the left paramedian superior midbrain and inferomedial thalamus. 2. Multiple other remote lacunar infarcts are evident within the thalami bilaterally. 3. Remote lacunar infarct of the genu of the left right internal capsule. 4. 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 5. Multiple foci of remote hemorrhage within the thalami a into lesions in the midbrain. This can be seen in the setting of hypertension or vasculitis. No acute hemorrhage is evident.  Electronically Signed: By: San Morelle M.D. On: 10/20/2014 08:02   Medications: I have reviewed the patient's current medications. Scheduled Meds: . aspirin EC  162 mg Oral Daily  . atorvastatin  40 mg Oral Daily  . enoxaparin (LOVENOX) injection  40 mg Subcutaneous Q24H  . insulin aspart  0-5 Units Subcutaneous QHS  . insulin aspart  0-9 Units Subcutaneous TID WC  . insulin aspart protamine- aspart  10 Units Subcutaneous BID WC  . linagliptin  5 mg Oral Daily  . cyanocobalamin  100 mcg Oral  Daily   Continuous Infusions:  PRN Meds:.acetaminophen **OR** acetaminophen, senna-docusate Assessment/Plan: Active Problems:   DM type 2 (diabetes mellitus, type 2)   B12 deficiency   Hyperlipidemia   Iron deficiency anemia   Essential hypertension   Thalamic infarct, acute   Acute lacunar stroke   Stroke with cerebral ischemia   Unsteady gait  Patient is a 74 year old gentleman with a history of type 2 diabetes, hypertension, CVA, diverticulosis, hyperlipidemia, iron deficiency anemia, B-12 deficiency, who presents to the hospital with an acute lacunar infarct.  Acute punctate lacunar infarct: Patient reporting that his blurred vision, diplopia, and unsteady gait have resolved over the last 24 hours.  MRI showing an acute punctate lacunar infarcts within the superior  midbrain and inferomedial thalamus.  -MRA with no evidence of proximal stenosis, aneurysms, or branch vessel occlusions -Hemoglobin A1c pending -Lipid panel wnl -Echocardiogram -Carotid Dopplers showing no evidence of stenosis though there is a heterogeneous strength sure of unknown etiology visualized. Will follow up final read.  -Increase aspirin to 162 mg daily given history of nosebleeds -Telemetry -No PT or SLP requirements.  -Permissive hypertension  Hypertension: Blood pressures have been within a normal range off home anti-hypertensives this morning. Patient is on amlodipine 5 mg daily, and lisinopril 40 mg daily at home. -Will continue holding home medications given normotension.  Type 2 diabetes: Patient has remained relatively normyglycemic since admission. Patient has a hemoglobin A1c of 8.8 from December 2016. At home, patient is on glipizide 10 mg twice a day, metformin 1000 mg twice a day, Januvia 100 mg daily, and Humulin 70/30 10 units twice a day. -SSI -Continue home Januvia 100 mg daily and 70/30 10 units twice a day -Hold metformin and glipizide combination pill for now.  Hyperlipidemia: Patient is on atorvastatin 40 mg daily at home. -Continue home atorvastatin  Iron deficiency anemia: Admission hemoglobin of 11.2. Patient has a documented history of iron deficiency anemia. No iron studies found in our system. Patient is not currently on any iron supplementation. Only ferritin from 2014 within normal limits found. Patient has a colonoscopy from 2007 showing diverticulosis. -Repeat iron panel pending  B-12 deficiency:  -Continue home B12  Diet: carb modified Prophylaxis: lovenox Code: Full  Dispo: Disposition is deferred at this time, awaiting improvement of current medical problems. Anticipated discharge in approximately 0-1 day(s).   The patient does have a current PCP (Timoteo Gaul, FNP) and does need an [facility] hospital follow-up appointment after  discharge.  The patient does not have transportation limitations that hinder transportation to clinic appointments.   LOS: 2 days   Services Needed at time of discharge: Y = Yes, Blank = No PT:   OT:   RN:   Equipment:   Other:    Luan Moore, MD 10/22/2014, 7:13 AM

## 2014-10-22 NOTE — Progress Notes (Signed)
  Date: 10/22/2014  Patient name: Scott Vasquez  Medical record number: [id number]  Date of birth: 1941/03/06   This patient's plan of care was discussed with the house staff. Please see their note for complete details. I concur with their findings.  S: He feels much better today and did not have blurry vision last night which was first night for him without this symptom.  General alert and oriented 3 acute distress.  HEENT normocephalic/atraumatic  Cardiovascular exam regular rate rhythm no murmurs gallop or rubs  Pulmonary clear to auscultation bilaterally without wheezes rhonchi rales  GI soft nondistended  Extremities no edema  Neurological exam: nonfocal. He is not having the blurry vision  74 year old gentleman with a history of type 2 diabetes, hypertension, CVA, diverticulosis, hyperlipidemia, iron deficiency anemia, B-12 deficiency, who presents to the hospital with an acute lacunar infarct. 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  #2 diabetes mellitus hold metformin and glipizide for now and continue insulin.  #3 B12 deficiency and iron deficiency anemia recheck iron levels and if he still has unexplained iron deficiency anemia he may need a colonoscopy as an outpatient  Truman Hayward, MD 10/22/2014, 11:11 AM

## 2014-10-22 NOTE — Progress Notes (Signed)
LOS: 2 days   Subjective:  pt is feeling better today, he states that is vision is " much better", there is no more diplopia.   Objective: BP 121/71 mmHg  Pulse 83  Temp(Src) 98.2 F (36.8 C) (Oral)  Resp 20  Ht 5\' 9"  (1.753 m)  Wt 89.404 kg (197 lb 1.6 oz)  BMI 29.09 kg/m2  SpO2 97% No intake or output data in the 24 hours ending 10/22/14 1045  Physical Exam: General: healthy, cooperative, awake and alert, No acute distress Skin: Skin color, texture, turgor normal. No rashes or lesions. HEENT:  Head : Atraumatic, normocephalic. Nose: no nasal congestion.patent, no discharge Throat: No tonsillar erythema, exudates or enlargement. Mouth: Moist mucus, no lesions, dentition good. Neck: supple, no LAD Heart: Pulse regular rate and rhythm, normal S1S2, no murmurs appreciated.  Chest/ Lung: Normal depth and effort.  No tenderness to palpation Clear lungs sounds No rales, rhonchi, wheezing, or rubs. Abdomen: Soft, NTND, +BS, no hepatosplenomegaly noted.  Extremities: Warm and well perfused, no clubbing/cyanosis/edema. Radial, pedal pulse +2, no edema. Neuro: CN II: Visual fields are full to confrontation. Pupils are 3 mm and briskly reactive to light.  CN III, IV, VI: At primary gaze, there is no eye deviation.  CN V: Facial sensation is intact. CN VII: Face is symmetric with normal eye closure and smile. CN VII: Hearing is normal to rubbing fingers CN IX, X: Palate elevates symmetrically. Phonation is normal. CN XI: Head turning and shoulder shrug are intact CN XII: Tongue is midline with normal movements and no atrophy. Motor: There is no pronator drift of out-stretched arms. Muscle bulk and tone are normal. Strength is full all extremities bilaterally. Reflexes: Reflexes are 2+ and symmetric at the biceps, triceps, knees, and ankles. Sensory: Light touch are intact except some mild numbness to left hand.  Coordination: Rapid alternating movements and fine  finger movements are intact. There is no dysmetria on finger-to-nose and heel-knee-shin. There are no abnormal or extraneous movements. Romberg is absent. Gait: Posture is normal. Gait is steady with normal steps.  Labs/Studies: I have reviewed labs and studies from last 24hrs per EMR.  Medications: I have reviewed the patient's current medications.  Assessment/Plan: Active Problems:   DM type 2 (diabetes mellitus, type 2)   B12 deficiency   Hyperlipidemia   Iron deficiency anemia   Essential hypertension   Thalamic infarct, acute   Acute lacunar stroke   Stroke with cerebral ischemia   Unsteady gait   Scott Vasquez is a 74 y.o. male with PMHx of stroke with a residual of left hand numbness, HTN, DM II, Hyperlipidemia that presented with blurred vision and unsteadiness.   # Acute punctate lacunar infarct;  pt vision keeps on improving. MRI of brain showed an acute punctuated nonhemorrhagic infarct within the left paramedian superior midbrain and inferomedial thalamus. Echocardiogram still pending. 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   -appreciate neurology consult.  -Echocardiogram pending.  -Carotid doppler final report pending.  -Consider getting CT neck with contrast.  -Q4neuro check -continue Aspirin for prophylactic therapy. - # Diabetes Mellitus Type II: Last Hb A1c on 07/20/14=8.8. Pt takes Humulin 70/30 10 units BID, Januvia 100 mg daily and glipizide-metformin 5-500mg  BID. Last CBG=147  -hold metformin -continue home Insuline70/30 -continue home Januvia -Sensitive SSI -Hb A1C  pending.  -carb modified diet -CBG AC & HS   # Hypertension: Home regiment consists of Lisinopril 40 mg daily, Amlodipine 5 mg daily, current BP is 144/73. Pt is positive for an acute  Thalamic infarct, Bp medication on hold since admission and pt remains normotensive.  Last BP=121/71 -continue to hold lisinopril -Continue to hold Amlodipine    # Hyperlipidemia: Patient takes atorvastatin 40 mg daily at home. Lipid panel show slightly low HDL of 39.   -continue Atorvastatin 40 mg daily.   # Normocytic anemia: Pt Hb is 11.2 With MCV of 87.8 . Iron deficiency is noted in pt history but he is not taking any iron supplementation and there is no iron study in the system except normal Ferritin in 2014. Pt is actually taking vitamin B-12 supplementation 100 mc daily. Last Vit B-12 value on 03-23-14 was normal at 818 pg/ml.   - continue vitamin B-12 daily -consider obtaining iron study.    Diet: carb modified.  Prophylaxis: SCD and Lovenox   This is a Careers information officer Note. The care of the patient was discussed with Dr. Raelene Bott and the assessment and plan was formulated with their assistance. Please see their note for official documentation of the patient encounter.

## 2014-10-23 DIAGNOSIS — H538 Other visual disturbances: Secondary | ICD-10-CM | POA: Insufficient documentation

## 2014-10-23 LAB — GLUCOSE, CAPILLARY
GLUCOSE-CAPILLARY: 147 mg/dL — AB (ref 70–99)
Glucose-Capillary: 199 mg/dL — ABNORMAL HIGH (ref 70–99)

## 2014-10-23 LAB — TSH: TSH: 0.278 u[IU]/mL — AB (ref 0.350–4.500)

## 2014-10-23 MED ORDER — CLOPIDOGREL BISULFATE 75 MG PO TABS
75.0000 mg | ORAL_TABLET | Freq: Every day | ORAL | Status: DC
Start: 2014-10-23 — End: 2014-10-23
  Administered 2014-10-23: 75 mg via ORAL
  Filled 2014-10-23: qty 1

## 2014-10-23 MED ORDER — CLOPIDOGREL BISULFATE 75 MG PO TABS: 75.0000 mg | ORAL_TABLET | Freq: Every day | ORAL | Status: DC

## 2014-10-23 NOTE — Progress Notes (Signed)
  Date: 10/23/2014  Patient name: Scott Vasquez  Medical record number: [id number]  Date of birth: 07/20/1941   This patient's plan of care was discussed with the house staff. Please see their note for complete details. I concur with their findings.  S: He continues to feel much better.  General alert and oriented 3 acute distress.  HEENT normocephalic/atraumatic  Cardiovascular exam regular rate rhythm no murmurs gallop or rubs  Pulmonary clear to auscultation bilaterally without wheezes rhonchi rales  GI soft nondistended  Extremities no edema  Neurological exam: nonfocal. He is not having the blurry vision  74 year old gentleman with a history of type 2 diabetes, hypertension, CVA, diverticulosis, hyperlipidemia, iron deficiency anemia, B-12 deficiency, who presents to the hospital with an acute lacunar infarct. His Carotid doppler prelim report with mass heterogenous appearance near his carotid artery   #1 Acute punctate lacunar infarct  aspirin  Change to Plavix per   Neurology. Have given permissive HTN as inpatient, would carefully add back ACEI FIRST as outpatient. BP have not been terribly high here  #2 Thyroid nodule: Thyroid function tests are pending. He needs to have a follow-up ultrasound likely guided biopsy. We would also benefit from being seen by an endocrinologist. He is followed by [facility]  #2 Diabetes  Mellitus: send home on his prior regimen  #3 Blurry vision: seems resolved but would like him to see PCP BEFORE returning to work and esp to driving to make sure has not recurred.  Stable for DC today.  Truman Hayward, MD 10/23/2014, 11:50 AM

## 2014-10-23 NOTE — Progress Notes (Signed)
Pt d/c to home by car with family. Assessment stable. Pt verbalizes understanding of d/c instructions. 

## 2014-10-23 NOTE — Discharge Instructions (Signed)
Please follow up with your primary care provider regarding further evaluation of your thyroid gland.    Ischemic Stroke A stroke (cerebrovascular accident) is the sudden death of brain tissue. It is a medical emergency. A stroke can cause permanent loss of brain function. This can cause problems with different parts of your body. A transient ischemic attack (TIA) is different because it does not cause permanent damage. A TIA is a short-lived problem of poor blood flow affecting a part of the brain. A TIA is also a serious problem because having a TIA greatly increases the chances of having a stroke. When symptoms first develop, you cannot know if the problem might be a stroke or a TIA. CAUSES  A stroke is caused by a decrease of oxygen supply to an area of your brain. It is usually the result of a small blood clot or collection of cholesterol or fat (plaque) that blocks blood flow in the brain. A stroke can also be caused by blocked or damaged carotid arteries.  RISK FACTORS  High blood pressure (hypertension).  High cholesterol.  Diabetes mellitus.  Heart disease.  The buildup of plaque in the blood vessels (peripheral artery disease or atherosclerosis).  The buildup of plaque in the blood vessels providing blood and oxygen to the brain (carotid artery stenosis).  An abnormal heart rhythm (atrial fibrillation).  Obesity.  Smoking.  Taking oral contraceptives (especially in combination with smoking).  Physical inactivity.  A diet high in fats, salt (sodium), and calories.  Alcohol use.  Use of illegal drugs (especially cocaine and methamphetamine).  Being African American.  Being over the age of 10.  Family history of stroke.  Previous history of blood clots, stroke, TIA, or heart attack.  Sickle cell disease. SYMPTOMS  These symptoms usually develop suddenly, or may be newly present upon awakening from sleep:  Sudden weakness or numbness of the face, arm, or leg,  especially on one side of the body.  Sudden trouble walking or difficulty moving arms or legs.  Sudden confusion.  Sudden personality changes.  Trouble speaking (aphasia) or understanding.  Difficulty swallowing.  Sudden trouble seeing in one or both eyes.  Double vision.  Dizziness.  Loss of balance or coordination.  Sudden severe headache with no known cause.  Trouble reading or writing. DIAGNOSIS  Your health care provider can often determine the presence or absence of a stroke based on your symptoms, history, and physical exam. Computed tomography (CT) of the brain is usually performed to confirm the stroke, determine causes, and determine stroke severity. Other tests may be done to find the cause of the stroke. These tests may include:  Electrocardiography.  Continuous heart monitoring.  Echocardiography.  Carotid ultrasonography.  Magnetic resonance imaging (MRI).  A scan of the brain circulation.  Blood tests. PREVENTION  The risk of a stroke can be decreased by appropriately treating high blood pressure, high cholesterol, diabetes, heart disease, and obesity and by quitting smoking, limiting alcohol, and staying physically active. TREATMENT  Time is of the essence. It is important to seek treatment at the first sign of these symptoms because you may receive a medicine to dissolve the clot (thrombolytic) that cannot be given if too much time has passed since your symptoms began. Even if you do not know when your symptoms began, get treatment as soon as possible as there are other treatment options available including oxygen, intravenous (IV) fluids, and medicines to thin the blood (anticoagulants). Treatment of stroke depends on the duration,  severity, and cause of your symptoms. Medicines and dietary changes may be used to address diabetes, high blood pressure, and other risk factors. Physical, speech, and occupational therapists will assess you and work with you to  improve any functions impaired by the stroke. Measures will be taken to prevent short-term and long-term complications, including infection from breathing foreign material into the lungs (aspiration pneumonia), blood clots in the legs, bedsores, and falls. Rarely, surgery may be needed to remove large blood clots or to open up blocked arteries. HOME CARE INSTRUCTIONS   Take medicines only as directed by your health care provider. Follow the directions carefully. Medicines may be used to control risk factors for a stroke. Be sure you understand all your medicine instructions.  You may be told to take a medicine to thin the blood, such as aspirin or the anticoagulant warfarin. Warfarin needs to be taken exactly as instructed.  Too much and too little warfarin are both dangerous. Too much warfarin increases the risk of bleeding. Too little warfarin continues to allow the risk for blood clots. While taking warfarin, you will need to have regular blood tests to measure your blood clotting time. These blood tests usually include both the PT and INR tests. The PT and INR results allow your health care provider to adjust your dose of warfarin. The dose can change for many reasons. It is critically important that you take warfarin exactly as prescribed, and that you have your PT and INR levels drawn exactly as directed.  Many foods, especially foods high in vitamin K, can interfere with warfarin and affect the PT and INR results. Foods high in vitamin K include spinach, kale, broccoli, cabbage, collard and turnip greens, brussels sprouts, peas, cauliflower, seaweed, and parsley, as well as beef and pork liver, green tea, and soybean oil. You should eat a consistent amount of foods high in vitamin K. Avoid major changes in your diet, or notify your health care provider before changing your diet. Arrange a visit with a dietitian to answer your questions.  Many medicines can interfere with warfarin and affect the PT  and INR results. You must tell your health care provider about any and all medicines you take. This includes all vitamins and supplements. Be especially cautious with aspirin and anti-inflammatory medicines. Do not take or discontinue any prescribed or over-the-counter medicine except on the advice of your health care provider or pharmacist.  Warfarin can have side effects, such as excessive bruising or bleeding. You will need to hold pressure over cuts for longer than usual. Your health care provider or pharmacist will discuss other potential side effects.  Avoid sports or activities that may cause injury or bleeding.  Be mindful when shaving, flossing your teeth, or handling sharp objects.  Alcohol can change the body's ability to handle warfarin. It is best to avoid alcoholic drinks or consume only very small amounts while taking warfarin. Notify your health care provider if you change your alcohol intake.  Notify your dentist or other health care providers before procedures.  If swallow studies have determined that your swallowing reflex is present, you should eat healthy foods. Including 5 or more servings of fruits and vegetables a day may reduce the risk of stroke. Foods may need to be a certain consistency (soft or pureed), or small bites may need to be taken in order to avoid aspirating or choking. Certain dietary changes may be advised to address high blood pressure, high cholesterol, diabetes, or obesity.  Food choices  that are low in sodium, saturated fat, trans fat, and cholesterol are recommended to manage high blood pressure.  Food choies that are high in fiber, and low in saturated fat, trans fat, and cholesterol may control cholesterol levels.  Controlling carbohydrates and sugar intake is recommended to manage diabetes.  Reducing calorie intake and making food choices that are low in sodium, saturated fat, trans fat, and cholesterol are recommended to manage obesity.  Maintain  a healthy weight.  Stay physically active. It is recommended that you get at least 30 minutes of activity on all or most days.  Do not use any tobacco products including cigarettes, chewing tobacco, or electronic cigarettes.  Limit alcohol use even if you are not taking warfarin. Moderate alcohol use is considered to be:  No more than 2 drinks each day for men.  No more than 1 drink each day for nonpregnant women.  Home safety. A safe home environment is important to reduce the risk of falls. Your health care provider may arrange for specialists to evaluate your home. Having grab bars in the bedroom and bathroom is often important. Your health care provider may arrange for equipment to be used at home, such as raised toilets and a seat for the shower.  Physical, occupational, and speech therapy. Ongoing therapy may be needed to maximize your recovery after a stroke. If you have been advised to use a walker or a cane, use it at all times. Be sure to keep your therapy appointments.  Follow all instructions for follow-up with your health care provider. This is very important. This includes any referrals, physical therapy, rehabilitation, and lab tests. Proper follow-up can prevent another stroke from occurring. SEEK MEDICAL CARE IF:  You have personality changes.  You have difficulty swallowing.  You are seeing double.  You have dizziness.  You have a fever.  You have skin breakdown. SEEK IMMEDIATE MEDICAL CARE IF:  Any of these symptoms may represent a serious problem that is an emergency. Do not wait to see if the symptoms will go away. Get medical help right away. Call your local emergency services (911 in [place] Do not drive yourself to the hospital.  You have sudden weakness or numbness of the face, arm, or leg, especially on one side of the body.  You have sudden trouble walking or difficulty moving arms or legs.  You have sudden confusion.  You have trouble speaking  (aphasia) or understanding.  You have sudden trouble seeing in one or both eyes.  You have a loss of balance or coordination.  You have a sudden, severe headache with no known cause.  You have new chest pain or an irregular heartbeat.  You have a partial or total loss of consciousness. Document Released: 07/27/2005 Document Revised: 12/11/2013 Document Reviewed: 03/06/2012 [facility] Patient Information 2015 [place], [place]. This information is not intended to replace advice given to you by your health care provider. Make sure you discuss any questions you have with your health care provider. Stroke Prevention Some medical conditions and behaviors are associated with an increased chance of having a stroke. You may prevent a stroke by making healthy choices and managing medical conditions. HOW CAN I REDUCE MY RISK OF HAVING A STROKE?   Stay physically active. Get at least 30 minutes of activity on most or all days.  Do not smoke. It may also be helpful to avoid exposure to secondhand smoke.  Limit alcohol use. Moderate alcohol use is considered to be:  No more  than 2 drinks per day for men.  No more than 1 drink per day for nonpregnant women.  Eat healthy foods. This involves:  Eating 5 or more servings of fruits and vegetables a day.  Making dietary changes that address high blood pressure (hypertension), high cholesterol, diabetes, or obesity.  Manage your cholesterol levels.  Making food choices that are high in fiber and low in saturated fat, trans fat, and cholesterol may control cholesterol levels.  Take any prescribed medicines to control cholesterol as directed by your health care provider.  Manage your diabetes.  Controlling your carbohydrate and sugar intake is recommended to manage diabetes.  Take any prescribed medicines to control diabetes as directed by your health care provider.  Control your hypertension.  Making food choices that are low in salt (sodium),  saturated fat, trans fat, and cholesterol is recommended to manage hypertension.  Take any prescribed medicines to control hypertension as directed by your health care provider.  Maintain a healthy weight.  Reducing calorie intake and making food choices that are low in sodium, saturated fat, trans fat, and cholesterol are recommended to manage weight.  Stop drug abuse.  Avoid taking birth control pills.  Talk to your health care provider about the risks of taking birth control pills if you are over 3 years old, smoke, get migraines, or have ever had a blood clot.  Get evaluated for sleep disorders (sleep apnea).  Talk to your health care provider about getting a sleep evaluation if you snore a lot or have excessive sleepiness.  Take medicines only as directed by your health care provider.  For some people, aspirin or blood thinners (anticoagulants) are helpful in reducing the risk of forming abnormal blood clots that can lead to stroke. If you have the irregular heart rhythm of atrial fibrillation, you should be on a blood thinner unless there is a good reason you cannot take them.  Understand all your medicine instructions.  Make sure that other conditions (such as anemia or atherosclerosis) are addressed. SEEK IMMEDIATE MEDICAL CARE IF:   You have sudden weakness or numbness of the face, arm, or leg, especially on one side of the body.  Your face or eyelid droops to one side.  You have sudden confusion.  You have trouble speaking (aphasia) or understanding.  You have sudden trouble seeing in one or both eyes.  You have sudden trouble walking.  You have dizziness.  You have a loss of balance or coordination.  You have a sudden, severe headache with no known cause.  You have new chest pain or an irregular heartbeat. Any of these symptoms may represent a serious problem that is an emergency. Do not wait to see if the symptoms will go away. Get medical help at once. Call  your local emergency services (911 in [place] Do not drive yourself to the hospital. Document Released: 09/03/2004 Document Revised: 12/11/2013 Document Reviewed: 01/27/2013 [facility] Patient Information 2015 [place], [place]. This information is not intended to replace advice given to you by your health care provider. Make sure you discuss any questions you have with your health care provider. STROKE/TIA DISCHARGE INSTRUCTIONS SMOKING Cigarette smoking nearly doubles your risk of having a stroke & is the single most alterable risk factor  If you smoke or have smoked in the last 12 months, you are advised to quit smoking for your health.  Most of the excess cardiovascular risk related to smoking disappears within a year of stopping.  Ask you doctor about anti-smoking  medications  [place] Quit Line: 1-800-QUIT NOW  Free Smoking Cessation Classes [phone]  CHOLESTEROL Know your levels; limit fat & cholesterol in your diet  Lipid Panel     Component Value Date/Time   CHOL 112 10/21/2014 0627   TRIG 61 10/21/2014 0627   TRIG 31 07/16/2006 1037   HDL 39* 10/21/2014 0627   CHOLHDL 2.9 10/21/2014 0627   CHOLHDL 2.5 CALC 07/16/2006 1037   VLDL 12 10/21/2014 0627   LDLCALC 61 10/21/2014 0627      Many patients benefit from treatment even if their cholesterol is at goal.  Goal: Total Cholesterol (CHOL) less than 160  Goal:  Triglycerides (TRIG) less than 150  Goal:  HDL greater than 40  Goal:  LDL (LDLCALC) less than 100   BLOOD PRESSURE American Stroke Association blood pressure target is less that 120/80 mm/Hg  Your discharge blood pressure is:  BP: 131/65 mmHg  Monitor your blood pressure  Limit your salt and alcohol intake  Many individuals will require more than one medication for high blood pressure  DIABETES (A1c is a blood sugar average for last 3 months) Goal HGBA1c is under 7% (HBGA1c is blood sugar average for last 3 months)  Diabetes:     Lab Results  Component Value  Date   HGBA1C 7.7* 10/21/2014     Your HGBA1c can be lowered with medications, healthy diet, and exercise.  Check your blood sugar as directed by your physician  Call your physician if you experience unexplained or low blood sugars.  PHYSICAL ACTIVITY/REHABILITATION Goal is 30 minutes at least 4 days per week  Activity: Increase activity slowly, Therapies:  Return to work:   Activity decreases your risk of heart attack and stroke and makes your heart stronger.  It helps control your weight and blood pressure; helps you relax and can improve your mood.  Participate in a regular exercise program.  Talk with your doctor about the best form of exercise for you (dancing, walking, swimming, cycling).  DIET/WEIGHT Goal is to maintain a healthy weight  Your discharge diet is: Diet - low sodium heart healthy  liquids Your height is:  Height: 5\' 9"  (175.3 cm) Your current weight is: Weight: 89.404 kg (197 lb 1.6 oz) Your Body Mass Index (BMI) is:  BMI (Calculated): 29.2  Following the type of diet specifically designed for you will help prevent another stroke.  Your goal weight range is:    Your goal Body Mass Index (BMI) is 19-24.  Healthy food habits can help reduce 3 risk factors for stroke:  High cholesterol, hypertension, and excess weight.  RESOURCES Stroke/Support Group:  Call [phone]   STROKE EDUCATION PROVIDED/REVIEWED AND GIVEN TO PATIENT Stroke warning signs and symptoms How to activate emergency medical system (call 911). Medications prescribed at discharge. Need for follow-up after discharge. Personal risk factors for stroke. Pneumonia vaccine given: No Flu vaccine given: No My questions have been answered, the writing is legible, and I understand these instructions.  I will adhere to these goals & educational materials that have been provided to me after my discharge from the hospital.

## 2014-10-23 NOTE — Progress Notes (Signed)
Subjective:  Patient is stating that his vision has continued to improve this morning. He denies any persistent diplopia over the last several days now and is not reporting any complaints.   Objective: Vital signs in last 24 hours: Filed Vitals:   10/22/14 1728 10/22/14 2200 10/23/14 0124 10/23/14 0718  BP: 131/70 154/76 145/74 127/100  Pulse: 70 66 67 79  Temp: 98 F (36.7 C) 98.1 F (36.7 C) 98.2 F (36.8 C) 98.1 F (36.7 C)  TempSrc: Oral Oral Oral Oral  Resp: 20 20 20 20   Height:      Weight:      SpO2: 98% 100% 97% 99%   Weight change:   Intake/Output Summary (Last 24 hours) at 10/23/14 0909 Last data filed at 10/22/14 1412  Gross per 24 hour  Intake    240 ml  Output      0 ml  Net    240 ml    General: Sitting comfortably on the edge of bed HEENT: PERRL, EOMI, no scleral icterus, no thyromegaly or nodules palpated Cardiac: RRR, no rubs, murmurs or gallops Pulm: clear to auscultation bilaterally, moving normal volumes of air Abd: soft, nontender, nondistended, BS present Ext: warm and well perfused, no pedal edema Neuro: alert and oriented X3  - cranial nerves II through XII intact  - motor: Strength 5+ throughout all 4 extremities Skin: no rashes or lesions noted Psych: appropriate affect  Lab Results: Basic Metabolic Panel:  Recent Labs Lab 10/20/14 0120  NA 141  K 3.7  CL 108  CO2 25  GLUCOSE 91  BUN 25*  CREATININE 1.19  CALCIUM 9.4   Liver Function Tests:  Recent Labs Lab 10/20/14 0120  AST 20  ALT 15  ALKPHOS 94  BILITOT 0.4  PROT 7.8  ALBUMIN 4.1   No results for input(s): LIPASE, AMYLASE in the last 168 hours. No results for input(s): AMMONIA in the last 168 hours. CBC:  Recent Labs Lab 10/20/14 0051  WBC 7.8  HGB 11.2*  HCT 35.1*  MCV 87.8  PLT 313   Cardiac Enzymes:  Recent Labs Lab 10/20/14 0120  TROPONINI <0.03   BNP: No results for input(s): PROBNP in the last 168 hours. D-Dimer: No results for  input(s): DDIMER in the last 168 hours. CBG:  Recent Labs Lab 10/21/14 2223 10/22/14 0641 10/22/14 1130 10/22/14 1646 10/22/14 2205 10/23/14 0721  GLUCAP 177* 147* 197* 168* 122* 147*   Hemoglobin A1C:  Recent Labs Lab 10/21/14 0627  HGBA1C 7.7*   Fasting Lipid Panel:  Recent Labs Lab 10/21/14 0627  CHOL 112  HDL 39*  LDLCALC 61  TRIG 61  CHOLHDL 2.9   Thyroid Function Tests: No results for input(s): TSH, T4TOTAL, FREET4, T3FREE, THYROIDAB in the last 168 hours. Coagulation:  Recent Labs Lab 10/20/14 0051  LABPROT 12.9  INR 0.96   Anemia Panel:  Recent Labs Lab 10/21/14 1845 10/22/14 0730  VITAMINB12 728  --   FOLATE  --  12.5  FERRITIN  --  146  TIBC  --  282  IRON  --  92  RETICCTPCT  --  0.7   Urine Drug Screen: Drugs of Abuse     Component Value Date/Time   LABOPIA NONE DETECTED 10/20/2014 0220   COCAINSCRNUR NONE DETECTED 10/20/2014 0220   LABBENZ NONE DETECTED 10/20/2014 0220   AMPHETMU NONE DETECTED 10/20/2014 0220   THCU NONE DETECTED 10/20/2014 0220   LABBARB NONE DETECTED 10/20/2014 0220    Alcohol Level:  Recent Labs Lab 10/20/14 0120  ETH <5   Urinalysis:  Recent Labs Lab 10/20/14 0220  COLORURINE YELLOW  LABSPEC 1.025  PHURINE 6.0  GLUCOSEU NEGATIVE  HGBUR NEGATIVE  BILIRUBINUR NEGATIVE  KETONESUR NEGATIVE  PROTEINUR NEGATIVE  UROBILINOGEN 0.2  NITRITE NEGATIVE  LEUKOCYTESUR NEGATIVE   Micro Results: No results found for this or any previous visit (from the past 240 hour(s)). Studies/Results: Ct Soft Tissue Neck W Contrast  10/22/2014   CLINICAL DATA:  Evaluate 4 cm round structure adjacent to the common carotid on carotid Doppler  EXAM: CT NECK WITH CONTRAST  TECHNIQUE: Multidetector CT imaging of the neck was performed using the standard protocol following the bolus administration of intravenous contrast.  CONTRAST:  75 mL Isovue 300 IV  COMPARISON:  None.  FINDINGS: Pharynx and larynx: Within normal limits.   Salivary glands: Within normal limits.  Thyroid: 3.6 x 3.8 x 4.9 cm hypoenhancing lesion measuring above simple fluid density in the lateral left thyroid lobe (series 201/image 95). Mild peripheral calcifications.  This displaces the carotid vessels laterally and likely accounts for the reported sonographic abnormality.  Lymph nodes: No suspicious lymphadenopathy.  Vascular: Within normal limits.  Limited intracranial: Within normal limits.  Visualized orbits: Bilateral orbits, including the globes and retroconal soft tissues, are within normal limits.  Mastoids and visualized paranasal sinuses: Visualized pinna sinuses and mastoids are clear.  Skeleton: No focal osseous lesions.  Upper chest: Visualized lung apices are clear.  IMPRESSION: 4.9 cm left thyroid nodule, likely corresponding to the sonographic abnormality.  Consider follow-up thyroid ultrasound as an outpatient, with biopsy as clinically warranted.   Electronically Signed   By: Julian Hy M.D.   On: 10/22/2014 19:13   Medications: I have reviewed the patient's current medications. Scheduled Meds: . atorvastatin  40 mg Oral Daily  . clopidogrel  75 mg Oral Daily  . enoxaparin (LOVENOX) injection  40 mg Subcutaneous Q24H  . insulin aspart  0-5 Units Subcutaneous QHS  . insulin aspart  0-9 Units Subcutaneous TID WC  . insulin aspart protamine- aspart  10 Units Subcutaneous BID WC  . linagliptin  5 mg Oral Daily  . cyanocobalamin  100 mcg Oral Daily   Continuous Infusions:  PRN Meds:.acetaminophen **OR** acetaminophen, senna-docusate Assessment/Plan: Active Problems:   DM type 2 (diabetes mellitus, type 2)   B12 deficiency   Hyperlipidemia   Iron deficiency anemia   Essential hypertension   Thalamic infarct, acute   Acute lacunar stroke   Stroke with cerebral ischemia   Unsteady gait   Lesion of neck   Diabetes mellitus type 2 with complications, uncontrolled   Thyroid nodule  Patient is a 74 year old gentleman with a  history of type 2 diabetes, hypertension, CVA, diverticulosis, hyperlipidemia, iron deficiency anemia, B-12 deficiency, who presents to the hospital with an acute lacunar infarct.  Acute punctate lacunar infarct: Patient reporting that his blurred vision, diplopia, and unsteady gait have resolved over the last 2 days. MRI showing an acute punctate lacunar infarcts within the superior midbrain and inferomedial thalamus. MRA with no evidence of proximal stenosis, aneurysms, or branch vessel occlusions. -Hemoglobin A1c 7.7.  -Lipid panel wnl -Carotid Dopplers showing no evidence of stenosis. -Aspirin switched to plavix given failure on aspirin.  -Telemetry  -No PT or SLP requirements.   Thyroid nodule: Left sided adjacent to the carotid. 4.9 cm. TSH pending.  -Will need thyroid ultrasound scheduled as an outpatient.  Diastolic heart failure: Echocardiogram showing grade 2 diastolic dysfunction. Patient with no  previous echocardiograms in the system. Patient is currently on an ACE inhibitor.  -Continue to follow up as an outpatient.   Hypertension: Blood pressures have been within a normal range off home anti-hypertensives this morning. Patient is on amlodipine 5 mg daily, and lisinopril 40 mg daily at home. -Will continue holding home medications given normotension.  Type 2 diabetes: Patient has remained relatively normyglycemic since admission. Patient with a hemoglobin of 7.7 from this admission. At home, patient is on glipizide 10 mg twice a day, metformin 1000 mg twice a day, Januvia 100 mg daily, and Humulin 70/30 10 units twice a day. -SSI -Continue home Januvia 100 mg daily and 70/30 10 units twice a day -Hold metformin and glipizide combination pill for now.  Hyperlipidemia: Patient is on atorvastatin 40 mg daily at home. -Continue home atorvastatin  Iron deficiency anemia: Admission hemoglobin of 11.2. Iron panel within normal limits. Patient has a documented history of iron  deficiency anemia. Patient has a colonoscopy from 2007 showing diverticulosis. -Consider colonoscopy as an outpatient.  B-12 deficiency:  -Continue home B12  Diet: carb modified Prophylaxis: lovenox Code: Full  Dispo: Disposition is deferred at this time, awaiting improvement of current medical problems. Anticipated discharge in approximately 0-1 day(s).   The patient does have a current PCP (Timoteo Gaul, FNP) and does need an [facility] hospital follow-up appointment after discharge.  The patient does not have transportation limitations that hinder transportation to clinic appointments.   LOS: 3 days   Services Needed at time of discharge: Y = Yes, Blank = No PT:   OT:   RN:   Equipment:   Other:    Luan Moore, MD 10/23/2014, 9:09 AM

## 2014-10-23 NOTE — Progress Notes (Signed)
LOS: 3 days   Subjective: Pt has no complaints, he is ready to go home.   Objective: BP 132/73 mmHg  Pulse 70  Temp(Src) 98 F (36.7 C) (Oral)  Resp 20  Ht 5\' 9"  (1.753 m)  Wt 89.404 kg (197 lb 1.6 oz)  BMI 29.09 kg/m2  SpO2 98%  Intake/Output Summary (Last 24 hours) at 10/23/14 1119 Last data filed at 10/22/14 1412  Gross per 24 hour  Intake    240 ml  Output      0 ml  Net    240 ml    Physical Exam:   Alert and oriented X 3 GEN: non apparent distress, lying in bed EYES: EOMI ENT: MMM CV: Hear rate regular, No murmur. S1 S2  PULM: respiration regular unlabored.            Lung auscultation: lungs sounds clear bilaterally. ABD: soft, non tender, no organomegaly noted by palpation, positive bowel sounds on auscultation. SKIN: Warm, intact, no lesions.  EXT: No edema Neuro: CN III-XII grossly intact, sensation intact, spontaneous movement of all limbs, no obvious deficits  Labs/Studies: I have reviewed labs and studies from last 24hrs per EMR.  Medications: I have reviewed the patient's current medications.  Assessment/Plan: Active Problems:   DM type 2 (diabetes mellitus, type 2)   B12 deficiency   Hyperlipidemia   Iron deficiency anemia   Essential hypertension   Thalamic infarct, acute   Acute lacunar stroke   Stroke with cerebral ischemia   Unsteady gait   Lesion of neck   Diabetes mellitus type 2 with complications, uncontrolled   Thyroid nodule   Scott Vasquez is a 74 y.o. male with PMHx of stroke with a residual of left hand numbness, HTN, DM II, Hyperlipidemia that presented with blurred vision and unsteadiness.   # Acute punctate lacunar infarct; pt vision keeps on improving. MRI of brain showed an acute punctuated nonhemorrhagic infarct within the left paramedian superior midbrain and inferomedial thalamus. Echocardiogram did no show any source for emboly .PT and speech therapist saw the pt and there is no follow up recommended.   -Follow up  with neurology in 2 months.    -Q4 neuro check -Switch Aspirin to Plavix per neurologist recommendation since pt had CVA while on aspirin.  # Thyroid nodule: pt has Thyroid nodule per CT neck on 10/23/14. TSH ordered and pending.   -Follow up thyroid ultrasound as outpatient. -Follow up TSH level  # Diabetes Mellitus Type II: Last Hb A1c on 07/20/14=8.8. Pt takes Humulin 70/30 10 units BID, Januvia 100 mg daily and glipizide-metformin 5-500mg  BID. Last CBG=147  -hold metformin -continue home Insuline70/30 -continue home Januvia -Sensitive SSI -Hb A1C pending.  -carb modified diet -CBG AC & HS   # Hypertension: Home regiment consists of Lisinopril 40 mg daily, Amlodipine 5 mg daily, current BP is 144/73. Pt is positive for an acute Thalamic infarct, BP medication on hold since admission and pt remains normotensive. Last BP=132/73 -continue to hold lisinopril -Continue to hold Amlodipine    # Hyperlipidemia: Patient takes atorvastatin 40 mg daily at home. Lipid panel show slightly low HDL of 39.   -continue Atorvastatin 40 mg daily.   # Normocytic anemia: Pt Hb is 11.2 With MCV of 87.8 . Iron deficiency is noted in pt history but he is not taking any iron supplementation and there is no iron study in the system except normal Ferritin in 2014. Pt is actually taking vitamin B-12 supplementation 100  mc daily. Last Vit B-12 value on 03-23-14 was normal at 818 pg/ml.   - continue vitamin B-12 daily -consider obtaining iron study.    Diet: carb modified.  Prophylaxis: SCD and Lovenox   This is a Careers information officer Note. The care of the patient was discussed with Dr. Raelene Bott and the assessment and plan was formulated with their assistance. Please see their note for official documentation of the patient encounter.

## 2014-10-23 NOTE — Discharge Summary (Signed)
Name: Scott Vasquez MRN: [id number] DOB: 09-Dec-1940 74 y.o. PCP: Timoteo Gaul, FNP  Date of Admission: 10/20/2014 12:40 AM Date of Discharge: 10/23/2014 Attending Physician: Truman Hayward, MD  Discharge Diagnosis: 1.  Active Problems:   DM type 2 (diabetes mellitus, type 2)   B12 deficiency   Hyperlipidemia   Iron deficiency anemia   Essential hypertension   Thalamic infarct, acute   Acute lacunar stroke   Stroke with cerebral ischemia   Unsteady gait   Lesion of neck   Diabetes mellitus type 2 with complications, uncontrolled   Thyroid nodule  Discharge Medications:   Medication List    ASK your doctor about these medications        amLODipine 5 MG tablet  Commonly known as:  NORVASC  Take 1 tablet (5 mg total) by mouth daily.     aspirin 81 MG tablet  Take 81 mg by mouth daily.     atorvastatin 40 MG tablet  Commonly known as:  LIPITOR  TAKE ONE TABLET BY MOUTH DAILY     cyanocobalamin 100 MCG tablet  Take 100 mcg by mouth daily.     glipiZIDE-metformin 5-500 MG per tablet  Commonly known as:  METAGLIP  TAKE TWO (2) TABLETS BY MOUTH TWICE DAILY BEFORE MEALS     HUMULIN 70/30 (70-30) 100 UNIT/ML injection  Generic drug:  insulin NPH-regular Human  INJECT 10 UNITS SUBCUTANEOUSLY TWICE DAILY (BEFORE BREAKFAST AND DINNER)     INSULIN SYRINGE 1CC/31GX5/16" 31G X 5/16" 1 ML Misc  Use two times a day as directed.     lisinopril 40 MG tablet  Commonly known as:  PRINIVIL,ZESTRIL  TAKE ONE TABLET ONCE DAILY     meloxicam 7.5 MG tablet  Commonly known as:  MOBIC  TAKE ONE TABLET BY MOUTH ONCE DAILY     sitaGLIPtin 100 MG tablet  Commonly known as:  JANUVIA  Take 1 tablet (100 mg total) by mouth daily.        Disposition and follow-up:   Scott.Scott Vasquez was discharged from [facility] in Good condition.  At the hospital follow up visit please address:  1.  Evaluate thyroid nodule with thyroid ultrasound and radionuclide  scan.  Consider colonoscopy in the setting of anemia and last colonoscopy was in 2007 showing diverticulosis.  2.  Labs / imaging needed at time of follow-up: Thyroid ultrasound and radionuclide scan.  3.  Pending labs/ test needing follow-up: none  Follow-up Appointments: Follow-up Information    Follow up with CAMPBELL, [place], FNP. Go on 10/31/2014.   Specialty:  Family Medicine   Why:  At 0200 PM   Contact information:   [place] [address] [phone]       Discharge Instructions: Discharge Instructions    Ambulatory referral to Neurology    Complete by:  As directed   Pt will follow up with Dr. Erlinda Hong at [facility] in about 2 months. Thanks.           Consultations:  Neurology  Procedures Performed:  Ct Head Wo Contrast  10/20/2014   CLINICAL DATA:  Blurry vision for 3 days, worsening. Unsteady gait and confusion for 2 days. Diarrhea.  EXAM: CT HEAD WITHOUT CONTRAST  TECHNIQUE: Contiguous axial images were obtained from the base of the skull through the vertex without intravenous contrast.  COMPARISON:  MRI of the brain report March 04, 2000 though images are not available for direct comparison.  FINDINGS: The ventricles and sulci are normal for age. No intraparenchymal hemorrhage, mass effect nor midline shift. Patchy supratentorial white matter hypodensities are within normal range for patient's age and though non-specific suggest sequelae of chronic small vessel ischemic disease. No acute large vascular territory infarcts. Subcentimeter hypodensity mesial RIGHT thalamus.  No abnormal extra-axial fluid collections. Basal cisterns are patent. Mild calcific atherosclerosis of the carotid siphons.  No skull fracture. LEFT parietal scalp scarring. The included ocular globes and orbital contents are non-suspicious. Bilateral ocular lens implants. The mastoid aircells and included paranasal sinuses are well-aerated.  IMPRESSION: No acute intracranial process ;  probable remote RIGHT medial thalamic lacunar infarct.   Electronically Signed   By: Elon Alas   On: 10/20/2014 03:00   Ct Soft Tissue Neck W Contrast  10/22/2014   CLINICAL DATA:  Evaluate 4 cm round structure adjacent to the common carotid on carotid Doppler  EXAM: CT NECK WITH CONTRAST  TECHNIQUE: Multidetector CT imaging of the neck was performed using the standard protocol following the bolus administration of intravenous contrast.  CONTRAST:  75 mL Isovue 300 IV  COMPARISON:  None.  FINDINGS: Pharynx and larynx: Within normal limits.  Salivary glands: Within normal limits.  Thyroid: 3.6 x 3.8 x 4.9 cm hypoenhancing lesion measuring above simple fluid density in the lateral left thyroid lobe (series 201/image 95). Mild peripheral calcifications.  This displaces the carotid vessels laterally and likely accounts for the reported sonographic abnormality.  Lymph nodes: No suspicious lymphadenopathy.  Vascular: Within normal limits.  Limited intracranial: Within normal limits.  Visualized orbits: Bilateral orbits, including the globes and retroconal soft tissues, are within normal limits.  Mastoids and visualized paranasal sinuses: Visualized pinna sinuses and mastoids are clear.  Skeleton: No focal osseous lesions.  Upper chest: Visualized lung apices are clear.  IMPRESSION: 4.9 cm left thyroid nodule, likely corresponding to the sonographic abnormality.  Consider follow-up thyroid ultrasound as an outpatient, with biopsy as clinically warranted.   Electronically Signed   By: Julian Hy M.D.   On: 10/22/2014 19:13   Scott Vasquez Head Wo Contrast  10/20/2014   CLINICAL DATA:  Acute brainstem infarct. Unsteady gait and confusion.  EXAM: MRA HEAD WITHOUT CONTRAST  TECHNIQUE: Angiographic images of the Circle of Willis were obtained using MRA technique without intravenous contrast.  COMPARISON:  MRI brain without contrast 10/20/2014  FINDINGS: Moderate tortuosity is present within the cervical internal  carotid arteries bilaterally without a significant stenosis. The A1 and M1 segments are normal. An azygos A2 segment is present. The MCA bifurcations are intact. There is moderate attenuation of MCA branch vessels on bilaterally.  The left vertebral artery is the dominant vessel. The right PICA origin is visualized and normal. The left AICA is dominant. The basilar artery is within normal limits. Both posterior cerebral arteries originate from the basilar tip. There is moderate attenuation of distal PCA branch vessels.  IMPRESSION: 1. Mild to moderate distal small vessel disease. 2. No significant proximal stenosis, aneurysm, or branch vessel occlusion.   Electronically Signed   By: San Morelle M.D.   On: 10/20/2014 12:57   Scott Brain Wo Contrast  10/20/2014   ADDENDUM REPORT: 10/20/2014 11:08  ADDENDUM: Voice recognition error: The first sentence of the impressions section should read "acute punctate nonhemorrhagic infarct within the left paramedian superior midbrain and inferomedial thalamus."   Electronically Signed   By: San Morelle M.D.   On: 10/20/2014 11:08   10/20/2014   CLINICAL DATA:  Difficulty  walking and visual problems for 2 days. Personal history of stroke. Abnormal CT.  EXAM: MRI HEAD WITHOUT CONTRAST  TECHNIQUE: Multiplanar, multiecho pulse sequences of the brain and surrounding structures were obtained without intravenous contrast.  COMPARISON:  CT head from the same day.  FINDINGS: Acute punctate lacunar infarct is evident within the superior midbrain and inferomedial thalamus. The lesion at the genu of the right internal capsule is remote. There are multiple other remote lacunar infarcts involving the thalami bilaterally. Remote lacunar infarcts are present within the centrum semi of bowel IA, left greater than right. Periventricular white matter changes are noted bilaterally.  The susceptibility weighted images demonstrate 2 foci of remote hemorrhage in the midbrain and  additional foci within the thalami bilaterally.  Flow is present in the major intracranial arteries. The patient is status post bilateral lens replacements. The globes and orbits are otherwise intact. The paranasal sinuses and the mastoid air cells are clear. The skullbase is within normal limits.  IMPRESSION: 1. No acute punctate nonhemorrhagic infarct within the left paramedian superior midbrain and inferomedial thalamus. 2. Multiple other remote lacunar infarcts are evident within the thalami bilaterally. 3. Remote lacunar infarct of the genu of the left right internal capsule. 4. 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 5. Multiple foci of remote hemorrhage within the thalami a into lesions in the midbrain. This can be seen in the setting of hypertension or vasculitis. No acute hemorrhage is evident.  Electronically Signed: By: San Morelle M.D. On: 10/20/2014 08:02    2D Echo: Study Conclusions  - Left ventricle: The cavity size was normal. Wall thickness was increased in a pattern of mild LVH. Systolic function was normal. The estimated ejection fraction was in the range of 60% to 65%. Wall motion was normal; there were no regional wall motion abnormalities. Features are consistent with a pseudonormal left ventricular filling pattern, with concomitant abnormal relaxation and increased filling pressure (grade 2 diastolic dysfunction). - Aortic valve: Mildly calcified annulus. There was mild regurgitation.  Cardiac Cath: None  Admission HPI: Patient is a 74 year old gentleman with a history of type 2 DM, hypertension, hyperlipidemia, prior CVA who presents with blurred vision and unsteady gait that may have started around March 9. Patient states that his vision blurred around Wednesday night into Thursday (10/17/14). He noticed that he was seeing double and blurred objects when he was driving in his car. He stated that he saw a red  light, but could not tell how close he was to the traffic light. He was able to make it home safely, though sought medical attention after consulting his family at home about his symptoms. He also felt unsteady on his feet and was unsure whether this was secondary to his vision changes or otherwise. He was then evaluated at [facility] for his symptoms where they performed CT head that was unrevealing. Because of this, he was told to come to [facility] for further evaluation.   Otherwise, patient denies any dysarthria, dizziness, weakness, recent illnesses, cough, choking, dysphagia, dysuria, hesitancy, frequency, diarrhea, constipation, paresthesias. Patient has a previous history of CVA around 10 years ago. At that time, he felt that the CVA was remarkable for paresthesias in his left hand and states that he still has some residual paresthesias.    Hospital Course by problem list: Active Problems:   DM type 2 (diabetes mellitus, type 2)   B12 deficiency   Hyperlipidemia   Iron deficiency anemia  Essential hypertension   Thalamic infarct, acute   Acute lacunar stroke   Stroke with cerebral ischemia   Unsteady gait   Lesion of neck   Diabetes mellitus type 2 with complications, uncontrolled   Thyroid nodule   Patient is a 74 year old gentleman with a history of type 2 diabetes, hypertension, CVA, diverticulosis, hyperlipidemia, iron deficiency anemia, B-12 deficiency, who presents to the hospital with an acute lacunar infarct.  Acute punctate lacunar infarct: Patient initially presenting with blurred vision, diplopia, and unsteady gait around 2 days prior to his initial presentation. Throughout his hospitalization, patient reported that his diplopia had gradually improved. A CT head was negative for any acute intracranial processes but an MRI brain showed an acute punctate lacunar infarcts within the superior midbrain and inferior medial thalamus. MRA was negative for any evidence of  proximal stenosis, aneurysms, or branch vessel occlusions. Carotid Dopplers were negative for any evidence of stenosis the patient was found to incidentally have a large multi echo extraction or adjacent to the common carotid. A follow-up neck CT with contrast was performed (see below). Otherwise, echocardiogram was negative for any evidence of source of embolus. Patient was initially admitted on aspirin 81 mg but was transitioned to Plavix given potential treatment failure.   Thyroid nodule: Left sided adjacent to the carotid. 4.9 cm. As demonstrated by CT of the neck on 10/22/14. TSH was low at 0.278 . Pt will need thyroid ultrasound and eventually radionuclide thyroid scan scheduled as an outpatient.   Grade 2 diastolic dysfunction: Patient found to have grade 2 diastolic dysfunction upon echocardiogram. Patient with no previous echocardiograms in the system. No signs of fluid overload upon exam. Patient denies any history of similar symptoms.   Hypertension: Blood pressures were within the normal range off home antihypertensives since admission. Patient is on amlodipine 5 mg daily and lisinopril 40 mg daily at home. Patient was to continue his blood pressure medications upon discharge.  Type 2 diabetes: Patient has remained relatively normyglycemic since admission. Patient with a hemoglobin A1C of 7.7 from this admission. At home, patient is on glipizide 10 mg twice a day, metformin 1000 mg twice a day, Januvia 100 mg daily, and Humulin 70/30 10 units twice a day. Resume home regiment at discharge.   Hyperlipidemia: Patient was on atorvastatin 40 mg daily at home and this was continued at discharge.  Iron deficiency anemia: Admission hemoglobin of 11.2. Iron panel within normal limits. Patient has a documented history of iron deficiency anemia. Patient has a colonoscopy from 2007 showing diverticulosis. Consider colonoscopy as an outpatient.  B-12 deficiency: Continue home B12.  Discharge  Vitals:   BP 132/73 mmHg  Pulse 70  Temp(Src) 98 F (36.7 C) (Oral)  Resp 20  Ht 5\' 9"  (1.753 m)  Wt 89.404 kg (197 lb 1.6 oz)  BMI 29.09 kg/m2  SpO2 98%  Discharge Labs:  Results for orders placed or performed during the hospital encounter of 10/20/14 (from the past 24 hour(s))  Glucose, capillary     Status: Abnormal   Collection Time: 10/22/14  4:46 PM  Result Value Ref Range   Glucose-Capillary 168 (H) 70 - 99 mg/dL  Glucose, capillary     Status: Abnormal   Collection Time: 10/22/14 10:05 PM  Result Value Ref Range   Glucose-Capillary 122 (H) 70 - 99 mg/dL   Comment 1 Notify RN    Comment 2 Documented in Char   Glucose, capillary     Status: Abnormal   Collection Time:  10/23/14  7:21 AM  Result Value Ref Range   Glucose-Capillary 147 (H) 70 - 99 mg/dL   Comment 1 Notify RN    Comment 2 Documented in Char   Glucose, capillary     Status: Abnormal   Collection Time: 10/23/14 11:09 AM  Result Value Ref Range   Glucose-Capillary 199 (H) 70 - 99 mg/dL   Comment 1 Notify RN    Comment 2 Documented in Char     Signed: Ferne Coe, RN 10/23/2014, 11:36 AM    Services Ordered on Discharge: None    Equipment Ordered on Discharge: None.

## 2014-10-25 ENCOUNTER — Other Ambulatory Visit: Payer: Self-pay

## 2014-10-25 MED ORDER — ONETOUCH ULTRA SYSTEM W/DEVICE KIT: PACK | Status: DC

## 2014-10-31 ENCOUNTER — Encounter: Payer: Self-pay | Admitting: Family Medicine

## 2014-10-31 ENCOUNTER — Ambulatory Visit (INDEPENDENT_AMBULATORY_CARE_PROVIDER_SITE_OTHER): Payer: BLUE CROSS/BLUE SHIELD | Admitting: Family Medicine

## 2014-10-31 VITALS — BP 130/80 | HR 83 | Temp 97.9°F | Wt 202.0 lb

## 2014-10-31 DIAGNOSIS — I639 Cerebral infarction, unspecified: Secondary | ICD-10-CM | POA: Diagnosis not present

## 2014-10-31 DIAGNOSIS — R7989 Other specified abnormal findings of blood chemistry: Secondary | ICD-10-CM

## 2014-10-31 DIAGNOSIS — I6381 Other cerebral infarction due to occlusion or stenosis of small artery: Secondary | ICD-10-CM

## 2014-10-31 DIAGNOSIS — I10 Essential (primary) hypertension: Secondary | ICD-10-CM | POA: Diagnosis not present

## 2014-10-31 DIAGNOSIS — E079 Disorder of thyroid, unspecified: Secondary | ICD-10-CM

## 2014-10-31 LAB — T4, FREE: FREE T4: 1.1 ng/dL (ref 0.60–1.60)

## 2014-10-31 LAB — TSH: TSH: 0.27 u[IU]/mL — AB (ref 0.35–4.50)

## 2014-10-31 NOTE — Progress Notes (Signed)
Pre visit review using our clinic review tool, if applicable. No additional management support is needed unless otherwise documented below in the visit note. 

## 2014-10-31 NOTE — Progress Notes (Signed)
   Subjective:    Patient ID: Scott Vasquez, male    DOB: 02/26/1941, 74 y.o.   MRN: [id number]  HPI  Patient seen for hospital follow-up. His chronic problems include history of type 2 diabetes, hyperlipidemia, hypertension. Had remote history stroke about 10 years ago. He was admitted on 10/20/2014 discharged on 10/23/2014. He was admitted with acute visual changes with diplopia. He was diagnosed with acute thalamic stroke. His symptoms cleared fully. He had initial CT head without contrast at [facility] which showed remote right medial thalamic lacunar infarct. CT neck revealed 4.9 cm left thyroid nodule. Patient had MR angiogram and MRI brain. He had no significant proximal stenosis. Mild to moderate distal small vessel disease. Acute punctate nonhemorrhagic infarct left thalamus. Echocardiogram unremarkable other than diastolic dysfunction.  Patient was admitted on aspirin discharged on Plavix. No change in blood pressure medications. Lipids well controlled. A1c 7.7%.  Left thyroid nodule with recommendation for thyroid ultrasound and possible radionuclide thyroid scan. Patient had hemoglobin 11.2 with normal iron studies. Previous colonoscopy 2007.  Past Medical History  Diagnosis Date  . Anemia     iron deficiency  . Diabetes mellitus     type II  . Hypertension   . Stroke   . MRSA (methicillin resistant Staphylococcus aureus)   . Diverticulosis of colon    Past Surgical History  Procedure Laterality Date  . Cataract extraction, bilateral    . Back surgery    . Shoulder surgery  08/2007    Supple    reports that he has quit smoking. He does not have any smokeless tobacco history on file. He reports that he does not drink alcohol or use illicit drugs. family history includes Diabetes in an other family member. No Known Allergies   Review of Systems  Constitutional: Negative for fatigue.  Eyes: Negative for visual disturbance.  Respiratory: Negative for cough, chest  tightness and shortness of breath.   Cardiovascular: Negative for chest pain, palpitations and leg swelling.  Endocrine: Negative for polydipsia and polyuria.  Genitourinary: Negative for dysuria.  Neurological: Negative for dizziness, syncope, weakness, light-headedness and headaches.       Objective:   Physical Exam  Constitutional: He is oriented to person, place, and time. He appears well-developed and well-nourished.  Neck: Neck supple.  Large approximate 5 cm nodular mass left thyroid region.  Cardiovascular: Normal rate and regular rhythm.   Pulmonary/Chest: Effort normal and breath sounds normal. No respiratory distress. He has no wheezes. He has no rales.  Musculoskeletal: He exhibits no edema.  Lymphadenopathy:    He has no cervical adenopathy.  Neurological: He is alert and oriented to person, place, and time. No cranial nerve deficit.  Psychiatric: He has a normal mood and affect. His behavior is normal.          Assessment & Plan:  #1 recent acute left thalamic stroke. He had diplopia which fully cleared with no residual symptoms. Patient transition from aspirin to Plavix. Lipids well controlled. Blood pressure stable. A1c fair control.  Echo and carotid dopplers unremarkable. #2 thyroid mass. Needs further evaluation. Start with thyroid ultrasound. Repeat TSH which was 0.28 in hospital and also add free T4. Will likely need biopsy  #3 hypertension which is stable and at goal  #4 mild anemia with normal iron indices.  Appears to be chronic and unchanged.

## 2014-11-06 ENCOUNTER — Other Ambulatory Visit: Payer: BLUE CROSS/BLUE SHIELD

## 2014-11-08 ENCOUNTER — Ambulatory Visit
Admission: RE | Admit: 2014-11-08 | Discharge: 2014-11-08 | Disposition: A | Discharge status: Home / Self Care | Payer: BLUE CROSS/BLUE SHIELD | Source: Ambulatory Visit | Attending: Family Medicine | Admitting: Family Medicine

## 2014-11-08 DIAGNOSIS — R221 Localized swelling, mass and lump, neck: Secondary | ICD-10-CM | POA: Diagnosis not present

## 2014-11-08 DIAGNOSIS — E079 Disorder of thyroid, unspecified: Secondary | ICD-10-CM

## 2014-11-14 ENCOUNTER — Telehealth: Payer: Self-pay | Admitting: Family

## 2014-11-14 ENCOUNTER — Other Ambulatory Visit: Payer: Self-pay | Admitting: Family Medicine

## 2014-11-14 DIAGNOSIS — E079 Disorder of thyroid, unspecified: Secondary | ICD-10-CM

## 2014-11-14 NOTE — Telephone Encounter (Signed)
Pt is still waiting for tsh biopsy to be sch.

## 2014-11-14 NOTE — Telephone Encounter (Signed)
Biopsy is ordered.

## 2014-11-15 ENCOUNTER — Telehealth: Payer: Self-pay

## 2014-11-15 NOTE — Telephone Encounter (Signed)
Pt had recent acute thalamic stroke.  I do NOT feel comfortable stopping his Plavix for 5 days for bx unless Neurology feels this would be OK.

## 2014-11-15 NOTE — Telephone Encounter (Signed)
Pt needs to stop Plavix for 5 days before procedure Thyroid Biopsy.  Fax to Luna Kitchens at [phone]

## 2014-11-16 ENCOUNTER — Telehealth: Payer: Self-pay | Admitting: Neurology

## 2014-11-16 NOTE — Telephone Encounter (Signed)
Left message for husband on 11/15/14.  Dr. Erlinda Hong office closes early on Fridays. Will callback on 11/19/14

## 2014-11-16 NOTE — Telephone Encounter (Signed)
Talked with Olin Hauser over the phone. It seems ASA 81mg  is acceptable substitute for the procedure. I would recommend to stop plavix 5 days before the procedure and replace by ASA 81mg  for the 5 days preop. Postop, resume plavix the second day and stop ASA if pt has not bleeding complications from the procedure and hemodynamically stable. Thanks.  Rosalin Hawking, MD PhD Stroke Neurology 11/16/2014 4:48 PM

## 2014-11-16 NOTE — Telephone Encounter (Signed)
Olin Hauser with [facility] is calling to get an ok for the patient to stop taking Plavix before a thyroid biopsy. Surgery cannot be scheduled until an ok is given. Please call and advise and a returned call is needed as soon as possible. Thank you.

## 2014-11-19 ENCOUNTER — Telehealth: Payer: Self-pay | Admitting: Neurology

## 2014-11-19 NOTE — Telephone Encounter (Signed)
Spoke with a lady  At Dr. Erlinda Hong office. Asked to speak to Dr. Erlinda Hong nurse and he is having different nurses working with him at the moment, and Dr. Erlinda Hong is not in the office today. Ask to leave a message to the Dr. Erlinda Hong in regards to note.

## 2014-11-19 NOTE — Telephone Encounter (Signed)
WID- Montrice with [place] at [facility] @ [phone] requesting patient to discontinue PLAVIX for 5 days prior to having Thyroid Biopsy.  Please call and advise.

## 2014-11-19 NOTE — Telephone Encounter (Signed)
Okay to do so with a small but acceptable peri-procedure risk for stroke/TIA and if patient willing.

## 2014-11-20 ENCOUNTER — Other Ambulatory Visit: Payer: Self-pay | Admitting: Internal Medicine

## 2014-11-20 ENCOUNTER — Other Ambulatory Visit: Payer: Self-pay | Admitting: Family

## 2014-11-21 ENCOUNTER — Telehealth: Payer: Self-pay | Admitting: Family

## 2014-11-21 ENCOUNTER — Ambulatory Visit
Admission: RE | Admit: 2014-11-21 | Discharge: 2014-11-21 | Disposition: A | Discharge status: Home / Self Care | Payer: BLUE CROSS/BLUE SHIELD | Source: Ambulatory Visit | Attending: Family Medicine | Admitting: Family Medicine

## 2014-11-21 ENCOUNTER — Other Ambulatory Visit (HOSPITAL_COMMUNITY)
Admission: RE | Admit: 2014-11-21 | Discharge: 2014-11-21 | Disposition: A | Discharge status: Home / Self Care | Payer: BLUE CROSS/BLUE SHIELD | Source: Ambulatory Visit | Attending: Interventional Radiology | Admitting: Interventional Radiology

## 2014-11-21 DIAGNOSIS — E041 Nontoxic single thyroid nodule: Secondary | ICD-10-CM | POA: Insufficient documentation

## 2014-11-21 DIAGNOSIS — E079 Disorder of thyroid, unspecified: Secondary | ICD-10-CM

## 2014-11-21 NOTE — Telephone Encounter (Signed)
Pt has been out of work since 10/22/2014. Pt did have the thyroid biopsy procedure this morning.

## 2014-11-21 NOTE — Telephone Encounter (Signed)
OK to release to work then.  Make sure he is back on his Plavix.

## 2014-11-21 NOTE — Telephone Encounter (Signed)
Pt saw dr [name] on 10-31-14 and was taken out of work. Pt would like to go back to work asap and needs a note stating its ok to return to work with no restrictions.  Pt has an appt to see cory on Monday

## 2014-11-22 NOTE — Telephone Encounter (Signed)
Pt informed. Pt is back on his Plavix

## 2014-11-23 ENCOUNTER — Ambulatory Visit: Payer: Medicare Other | Admitting: Family

## 2014-11-26 ENCOUNTER — Ambulatory Visit (INDEPENDENT_AMBULATORY_CARE_PROVIDER_SITE_OTHER): Payer: BLUE CROSS/BLUE SHIELD | Admitting: Adult Health

## 2014-11-26 ENCOUNTER — Encounter: Payer: Self-pay | Admitting: Adult Health

## 2014-11-26 ENCOUNTER — Other Ambulatory Visit: Payer: Self-pay | Admitting: Family Medicine

## 2014-11-26 VITALS — BP 120/70 | Temp 98.4°F | Wt 204.3 lb

## 2014-11-26 DIAGNOSIS — Z7689 Persons encountering health services in other specified circumstances: Secondary | ICD-10-CM

## 2014-11-26 DIAGNOSIS — E1159 Type 2 diabetes mellitus with other circulatory complications: Secondary | ICD-10-CM | POA: Diagnosis not present

## 2014-11-26 DIAGNOSIS — Z23 Encounter for immunization: Secondary | ICD-10-CM

## 2014-11-26 DIAGNOSIS — Z7189 Other specified counseling: Secondary | ICD-10-CM

## 2014-11-26 DIAGNOSIS — E119 Type 2 diabetes mellitus without complications: Secondary | ICD-10-CM

## 2014-11-26 DIAGNOSIS — E079 Disorder of thyroid, unspecified: Secondary | ICD-10-CM

## 2014-11-26 MED ORDER — LINAGLIPTIN 5 MG PO TABS: 5.0000 mg | ORAL_TABLET | Freq: Every day | ORAL | Status: DC

## 2014-11-26 NOTE — Patient Instructions (Addendum)
Finish up this month of Januvia and then start Tradjenta the following month. This is the same type of medication that Januvia is. Continue to work on [employer] and exercise. Come to the lab in July for a repeat A1c. Please follow up in December for your complete physical. In the mean time if you need anything, please let me know. It was a pleasure meeting you.    Health Maintenance A healthy lifestyle and preventative care can promote health and wellness.  Maintain regular health, dental, and eye exams.  Eat a healthy diet. Foods like vegetables, fruits, whole grains, low-fat dairy products, and lean protein foods contain the nutrients you need and are low in calories. Decrease your intake of foods high in solid fats, added sugars, and salt. Get information about a proper diet from your health care provider, if necessary.  Regular physical exercise is one of the most important things you can do for your health. Most adults should get at least 150 minutes of moderate-intensity exercise (any activity that increases your heart rate and causes you to sweat) each week. In addition, most adults need muscle-strengthening exercises on 2 or more days a week.   Maintain a healthy weight. The body mass index (BMI) is a screening tool to identify possible weight problems. It provides an estimate of body fat based on height and weight. Your health care provider can find your BMI and can help you achieve or maintain a healthy weight. For males 20 years and older:  A BMI below 18.5 is considered underweight.  A BMI of 18.5 to 24.9 is normal.  A BMI of 25 to 29.9 is considered overweight.  A BMI of 30 and above is considered obese.  Maintain normal blood lipids and cholesterol by exercising and minimizing your intake of saturated fat. Eat a balanced diet with plenty of fruits and vegetables. Blood tests for lipids and cholesterol should begin at age 31 and be repeated every 5 years. If your lipid or  cholesterol levels are high, you are over age 13, or you are at high risk for heart disease, you may need your cholesterol levels checked more frequently.Ongoing high lipid and cholesterol levels should be treated with medicines if diet and exercise are not working.  If you smoke, find out from your health care provider how to quit. If you do not use tobacco, do not start.  Lung cancer screening is recommended for adults aged 65-80 years who are at high risk for developing lung cancer because of a history of smoking. A yearly low-dose CT scan of the lungs is recommended for people who have at least a 30-pack-year history of smoking and are current smokers or have quit within the past 15 years. A pack year of smoking is smoking an average of 1 pack of cigarettes a day for 1 year (for example, a 30-pack-year history of smoking could mean smoking 1 pack a day for 30 years or 2 packs a day for 15 years). Yearly screening should continue until the smoker has stopped smoking for at least 15 years. Yearly screening should be stopped for people who develop a health problem that would prevent them from having lung cancer treatment.  If you choose to drink alcohol, do not have more than 2 drinks per day. One drink is considered to be 12 oz (360 mL) of beer, 5 oz (150 mL) of wine, or 1.5 oz (45 mL) of liquor.  Avoid the use of street drugs. Do not share  needles with anyone. Ask for help if you need support or instructions about stopping the use of drugs.  High blood pressure causes heart disease and increases the risk of stroke. Blood pressure should be checked at least every 1-2 years. Ongoing high blood pressure should be treated with medicines if weight loss and exercise are not effective.  If you are 42-72 years old, ask your health care provider if you should take aspirin to prevent heart disease.  Diabetes screening involves taking a blood sample to check your fasting blood sugar level. This should be done  once every 3 years after age 26 if you are at a normal weight and without risk factors for diabetes. Testing should be considered at a younger age or be carried out more frequently if you are overweight and have at least 1 risk factor for diabetes.  Colorectal cancer can be detected and often prevented. Most routine colorectal cancer screening begins at the age of 2 and continues through age 78. However, your health care provider may recommend screening at an earlier age if you have risk factors for colon cancer. On a yearly basis, your health care provider may provide home test kits to check for hidden blood in the stool. A small camera at the end of a tube may be used to directly examine the colon (sigmoidoscopy or colonoscopy) to detect the earliest forms of colorectal cancer. Talk to your health care provider about this at age 64 when routine screening begins. A direct exam of the colon should be repeated every 5-10 years through age 63, unless early forms of precancerous polyps or small growths are found.  People who are at an increased risk for hepatitis B should be screened for this virus. You are considered at high risk for hepatitis B if:  You were born in a country where hepatitis B occurs often. Talk with your health care provider about which countries are considered high risk.  Your parents were born in a high-risk country and you have not received a shot to protect against hepatitis B (hepatitis B vaccine).  You have HIV or AIDS.  You use needles to inject street drugs.  You live with, or have sex with, someone who has hepatitis B.  You are a man who has sex with other men (MSM).  You get hemodialysis treatment.  You take certain medicines for conditions like cancer, organ transplantation, and autoimmune conditions.  Hepatitis C blood testing is recommended for all people born from [birth year] through 1965 and any individual with known risk factors for hepatitis C.  Healthy men should  no longer receive prostate-specific antigen (PSA) blood tests as part of routine cancer screening. Talk to your health care provider about prostate cancer screening.  Testicular cancer screening is not recommended for adolescents or adult males who have no symptoms. Screening includes self-exam, a health care provider exam, and other screening tests. Consult with your health care provider about any symptoms you have or any concerns you have about testicular cancer.  Practice safe sex. Use condoms and avoid high-risk sexual practices to reduce the spread of sexually transmitted infections (STIs).  You should be screened for STIs, including gonorrhea and chlamydia if:  You are sexually active and are younger than 24 years.  You are older than 24 years, and your health care provider tells you that you are at risk for this type of infection.  Your sexual activity has changed since you were last screened, and you are at an increased  risk for chlamydia or gonorrhea. Ask your health care provider if you are at risk.  If you are at risk of being infected with HIV, it is recommended that you take a prescription medicine daily to prevent HIV infection. This is called pre-exposure prophylaxis (PrEP). You are considered at risk if:  You are a man who has sex with other men (MSM).  You are a heterosexual man who is sexually active with multiple partners.  You take drugs by injection.  You are sexually active with a partner who has HIV.  Talk with your health care provider about whether you are at high risk of being infected with HIV. If you choose to begin PrEP, you should first be tested for HIV. You should then be tested every 3 months for as long as you are taking PrEP.  Use sunscreen. Apply sunscreen liberally and repeatedly throughout the day. You should seek shade when your shadow is shorter than you. Protect yourself by wearing long sleeves, pants, a wide-brimmed hat, and sunglasses year round  whenever you are outdoors.  Tell your health care provider of new moles or changes in moles, especially if there is a change in shape or color. Also, tell your health care provider if a mole is larger than the size of a pencil eraser.  A one-time screening for abdominal aortic aneurysm (AAA) and surgical repair of large AAAs by ultrasound is recommended for men aged 32-75 years who are current or former smokers.  Stay current with your vaccines (immunizations). Document Released: 01/23/2008 Document Revised: 08/01/2013 Document Reviewed: 12/22/2010 [facility] Patient Information 2015 [place], [place]. This information is not intended to replace advice given to you by your health care provider. Make sure you discuss any questions you have with your health care provider.

## 2014-11-26 NOTE — Progress Notes (Deleted)
Subjective:     Patient ID: Scott Vasquez, male   DOB: [birth year], 74 y.o.   MRN: [id number]  HPI   Review of Systems     Objective:   Physical Exam     Assessment:     ***    Plan:     ***

## 2014-11-26 NOTE — Progress Notes (Addendum)
HPI:  Scott Vasquez is here to establish care.  Last PCP and physical: December 2015 with NP Scott Vasquez.  Eye exam: 2014/09/28 Colonoscopy: needs, last was in 09-28-2002  Scott Vasquez is a very pleasant 74 year old male who presents today with his sister to establish care.    Has the following chronic problems that require follow up and concerns today:   Diabetes Endorses feeling as though his diabetes is well controlled. States that his blood sugars at home are around 80-130. Patient states that his Scott Vasquez is expensive to fill $110.   HTN Endorses being well controlled on current regimen. Does not feel as though his blood pressure has highs or lows.    ROS negative for unless reported above: fevers, unintentional weight loss, hearing or vision loss, chest pain, palpitations, struggling to breath, hemoptysis, melena, hematochezia, hematuria, falls, loc, si, thoughts of self harm  Past Medical History  Diagnosis Date  . Anemia     iron deficiency  . Diabetes mellitus     type II  . Hypertension   . Stroke   . MRSA (methicillin resistant Staphylococcus aureus)   . Diverticulosis of colon     Past Surgical History  Procedure Laterality Date  . Cataract extraction, bilateral    . Back surgery    . Shoulder surgery  Sep 29, 2007    Supple    Family History  Problem Relation Age of Onset  . Diabetes    . Diabetes Father     Died from diabetic coma in 09/28/70  . Stroke Mother     Died in 104  . Hypertension Mother   . Hypertension Father     History   Social History  . Marital Status: Scott    Spouse Name: N/A  . Number of Vasquez: N/A  . Years of Education: N/A   Occupational History  . retired Scott Vasquez   Social History Main Topics  . Smoking status: Former Research scientist (life sciences)  . Smokeless tobacco: Not on file  . Alcohol Use: No  . Drug Use: No  . Sexual Activity: Not on file   Other Topics Concern  . None   Social History Narrative   Works at [employer] for 10 years   -  Scott Vasquez who live locally   - No pets   - Attend church for fun.        Current outpatient prescriptions:  .  amLODipine (NORVASC) 5 MG tablet, Take 1 tablet (5 mg total) by mouth daily., Disp: 90 tablet, Rfl: 1 .  atorvastatin (LIPITOR) 40 MG tablet, TAKE ONE TABLET BY MOUTH DAILY, Disp: 90 tablet, Rfl: 1 .  Blood Glucose Monitoring Suppl (ONE TOUCH ULTRA SYSTEM KIT) W/DEVICE KIT, Use to check blood sugar twice a day, Disp: 1 each, Rfl: 0 .  clopidogrel (PLAVIX) 75 MG tablet, TAKE ONE TABLET BY MOUTH DAILY., Disp: 90 tablet, Rfl: 1 .  cyanocobalamin 100 MCG tablet, Take 100 mcg by mouth daily., Disp: , Rfl:  .  glipiZIDE-metformin (METAGLIP) 5-500 MG per tablet, TAKE TWO (2) TABLETS BY MOUTH TWICE DAILY BEFORE MEALS, Disp: 120 tablet, Rfl: 2 .  HUMULIN 70/30 (70-30) 100 UNIT/ML injection, INJECT 10 UNITS SUBCUTANEOUSLY TWICE DAILY (BEFORE BREAKFAST AND DINNER), Disp: 10 mL, Rfl: 3 .  Insulin Syringe-Needle U-100 (INSULIN SYRINGE 1CC/31GX5/16") 31G X 5/16" 1 ML MISC, Use two times a day as directed. , Disp: , Rfl:  .  Scott Vasquez 100 MG tablet, TAKE ONE TABLET BY MOUTH DAILY,  Disp: 90 tablet, Rfl: 1 .  lisinopril (PRINIVIL,ZESTRIL) 40 MG tablet, TAKE ONE TABLET ONCE DAILY, Disp: 90 tablet, Rfl: 1 .  meloxicam (MOBIC) 7.5 MG tablet, TAKE ONE TABLET BY MOUTH ONCE DAILY, Disp: 30 tablet, Rfl: 3 .  linagliptin (Scott Vasquez) 5 MG TABS tablet, Take 1 tablet (5 mg total) by mouth daily., Disp: 30 tablet, Rfl: 6 .  ONE TOUCH ULTRA TEST test strip, , Disp: , Rfl: 0 .  ONETOUCH DELICA LANCETS 10U MISC, , Disp: , Rfl: 0  EXAM:  Filed Vitals:   11/26/14 1343  BP: 120/70  Temp: 98.4 F (36.9 C)    Body mass index is 30.16 kg/(m^2).  GENERAL: vitals reviewed and listed above, alert, oriented, appears well hydrated and in no acute distress  HEENT: atraumatic, conjunttiva clear, no obvious abnormalities on inspection of external nose and ears  NECK: Small nodule on left side of  thyroid this has been biopsied.   LUNGS: clear to auscultation bilaterally, no wheezes, rales or rhonchi, good air movement  CV: HRRR, no peripheral edema. No carotid bruit.   MS: moves all extremities without noticeable abnormality  PSYCH: pleasant and cooperative, no obvious depression or anxiety  ASSESSMENT AND PLAN:    Discussed the following assessment and plan:  1. Need for Tdap vaccination - Tdap vaccine greater than or equal to 7yo IM  2. Need for vaccination for Strep pneumoniae - Pneumococcal conjugate vaccine 13-valent IM  3. Type 2 diabetes mellitus with other circulatory complications - Since cost of Scott Vasquez is of an issue I advised him to finish his current prescription. I prescribed Scott Vasquez 48m and gave him a savings card to help lower his cost of the medication.  - Hemoglobin A1c; Future - Keep a journal of blood sugars and bring to office. - Follow up as needed  4. Encounter to establish care - Come back in December for CPX - Work on diet and exercise - Follow up as needed.  - Referral for Colonoscopy placed   -We reviewed the PMH, PSH, FH, SH, Meds and Allergies. -We provided refills for any medications we will prescribe as needed. -We addressed current concerns per orders and patient instructions. -We have asked for records for pertinent exams, studies, vaccines and notes from previous providers. -We have advised patient to follow up per instructions below.   -Patient advised to return or notify a provider immediately if symptoms worsen or persist or new concerns arise.  Patient Instructions  Scott Vasquez this month of Scott Vasquez and then start Scott Vasquez the following month. This is the same type of medication that Scott Vasquez is. Continue to work on [employer] exercise. Come to the lab in July for a repeat A1c. Please follow up in December for your complete physical. In the mean time if you need anything, please let me know. It was a pleasure meeting you.     Health Maintenance A healthy lifestyle and preventative care can promote health and wellness.  Maintain regular health, dental, and eye exams.  Eat a healthy diet. Foods like vegetables, fruits, whole grains, low-fat dairy products, and lean protein foods contain the nutrients you need and are low in calories. Decrease your intake of foods high in solid fats, added sugars, and salt. Get information about a proper diet from your health care provider, if necessary.  Regular physical exercise is one of the most important things you can do for your health. Most adults should get at least 150 minutes of moderate-intensity exercise (any  activity that increases your heart rate and causes you to sweat) each week. In addition, most adults need muscle-strengthening exercises on 2 or more days a week.   Maintain a healthy weight. The body mass index (BMI) is a screening tool to identify possible weight problems. It provides an estimate of body fat based on height and weight. Your health care provider can find your BMI and can help you achieve or maintain a healthy weight. For males 20 years and older:  A BMI below 18.5 is considered underweight.  A BMI of 18.5 to 24.9 is normal.  A BMI of 25 to 29.9 is considered overweight.  A BMI of 30 and above is considered obese.  Maintain normal blood lipids and cholesterol by exercising and minimizing your intake of saturated fat. Eat a balanced diet with plenty of fruits and vegetables. Blood tests for lipids and cholesterol should begin at age 34 and be repeated every 5 years. If your lipid or cholesterol levels are high, you are over age 40, or you are at high risk for heart disease, you may need your cholesterol levels checked more frequently.Ongoing high lipid and cholesterol levels should be treated with medicines if diet and exercise are not working.  If you smoke, find out from your health care provider how to quit. If you do not use tobacco, do not  start.  Lung cancer screening is recommended for adults aged 73-80 years who are at high risk for developing lung cancer because of a history of smoking. A yearly low-dose CT scan of the lungs is recommended for people who have at least a 30-pack-year history of smoking and are current smokers or have quit within the past 15 years. A pack year of smoking is smoking an average of 1 pack of cigarettes a day for 1 year (for example, a 30-pack-year history of smoking could mean smoking 1 pack a day for 30 years or 2 packs a day for 15 years). Yearly screening should continue until the smoker has stopped smoking for at least 15 years. Yearly screening should be stopped for people who develop a health problem that would prevent them from having lung cancer treatment.  If you choose to drink alcohol, do not have more than 2 drinks per day. One drink is considered to be 12 oz (360 mL) of beer, 5 oz (150 mL) of wine, or 1.5 oz (45 mL) of liquor.  Avoid the use of street drugs. Do not share needles with anyone. Ask for help if you need support or instructions about stopping the use of drugs.  High blood pressure causes heart disease and increases the risk of stroke. Blood pressure should be checked at least every 1-2 years. Ongoing high blood pressure should be treated with medicines if weight loss and exercise are not effective.  If you are 66-22 years old, ask your health care provider if you should take aspirin to prevent heart disease.  Diabetes screening involves taking a blood sample to check your fasting blood sugar level. This should be done once every 3 years after age 39 if you are at a normal weight and without risk factors for diabetes. Testing should be considered at a younger age or be carried out more frequently if you are overweight and have at least 1 risk factor for diabetes.  Colorectal cancer can be detected and often prevented. Most routine colorectal cancer screening begins at the age of 34  and continues through age 65. However, your health care  provider may recommend screening at an earlier age if you have risk factors for colon cancer. On a yearly basis, your health care provider may provide home test kits to check for hidden blood in the stool. A small camera at the end of a tube may be used to directly examine the colon (sigmoidoscopy or colonoscopy) to detect the earliest forms of colorectal cancer. Talk to your health care provider about this at age 6 when routine screening begins. A direct exam of the colon should be repeated every 5-10 years through age 21, unless early forms of precancerous polyps or small growths are found.  People who are at an increased risk for hepatitis B should be screened for this virus. You are considered at high risk for hepatitis B if:  You were born in a country where hepatitis B occurs often. Talk with your health care provider about which countries are considered high risk.  Your parents were born in a high-risk country and you have not received a shot to protect against hepatitis B (hepatitis B vaccine).  You have HIV or AIDS.  You use needles to inject street drugs.  You live with, or have sex with, someone who has hepatitis B.  You are a man who has sex with other men (MSM).  You get hemodialysis treatment.  You take certain medicines for conditions like cancer, organ transplantation, and autoimmune conditions.  Hepatitis C blood testing is recommended for all people born from [birth year] through 1965 and any individual with known risk factors for hepatitis C.  Healthy men should no longer receive prostate-specific antigen (PSA) blood tests as part of routine cancer screening. Talk to your health care provider about prostate cancer screening.  Testicular cancer screening is not recommended for adolescents or adult males who have no symptoms. Screening includes self-exam, a health care provider exam, and other screening tests. Consult with your  health care provider about any symptoms you have or any concerns you have about testicular cancer.  Practice safe sex. Use condoms and avoid high-risk sexual practices to reduce the spread of sexually transmitted infections (STIs).  You should be screened for STIs, including gonorrhea and chlamydia if:  You are sexually active and are younger than 24 years.  You are older than 24 years, and your health care provider tells you that you are at risk for this type of infection.  Your sexual activity has changed since you were last screened, and you are at an increased risk for chlamydia or gonorrhea. Ask your health care provider if you are at risk.  If you are at risk of being infected with HIV, it is recommended that you take a prescription medicine daily to prevent HIV infection. This is called pre-exposure prophylaxis (PrEP). You are considered at risk if:  You are a man who has sex with other men (MSM).  You are a heterosexual man who is sexually active with multiple partners.  You take drugs by injection.  You are sexually active with a partner who has HIV.  Talk with your health care provider about whether you are at high risk of being infected with HIV. If you choose to begin PrEP, you should first be tested for HIV. You should then be tested every 3 months for as long as you are taking PrEP.  Use sunscreen. Apply sunscreen liberally and repeatedly throughout the day. You should seek shade when your shadow is shorter than you. Protect yourself by wearing long sleeves, pants, a wide-brimmed hat, and  sunglasses year round whenever you are outdoors.  Tell your health care provider of new moles or changes in moles, especially if there is a change in shape or color. Also, tell your health care provider if a mole is larger than the size of a pencil eraser.  A one-time screening for abdominal aortic aneurysm (AAA) and surgical repair of large AAAs by ultrasound is recommended for men aged  46-75 years who are current or former smokers.  Stay current with your vaccines (immunizations). Document Released: 01/23/2008 Document Revised: 08/01/2013 Document Reviewed: 12/22/2010 [facility] Patient Information 2015 [place], [place]. This information is not intended to replace advice given to you by your health care provider. Make sure you discuss any questions you have with your health care provider.      [name], AGNP

## 2014-11-26 NOTE — Progress Notes (Signed)
Pre visit review using our clinic review tool, if applicable. No additional management support is needed unless otherwise documented below in the visit note. 

## 2014-12-24 ENCOUNTER — Institutional Professional Consult (permissible substitution): Payer: Self-pay | Admitting: Neurology

## 2014-12-28 DIAGNOSIS — H40053 Ocular hypertension, bilateral: Secondary | ICD-10-CM | POA: Diagnosis not present

## 2014-12-28 DIAGNOSIS — Z961 Presence of intraocular lens: Secondary | ICD-10-CM | POA: Diagnosis not present

## 2014-12-28 DIAGNOSIS — E119 Type 2 diabetes mellitus without complications: Secondary | ICD-10-CM | POA: Diagnosis not present

## 2014-12-28 LAB — HM DIABETES EYE EXAM

## 2015-01-01 ENCOUNTER — Encounter: Payer: Self-pay | Admitting: Adult Health

## 2015-01-22 ENCOUNTER — Other Ambulatory Visit: Payer: Self-pay | Admitting: Family

## 2015-01-25 ENCOUNTER — Ambulatory Visit (INDEPENDENT_AMBULATORY_CARE_PROVIDER_SITE_OTHER): Payer: BLUE CROSS/BLUE SHIELD | Admitting: Adult Health

## 2015-01-25 ENCOUNTER — Encounter: Payer: Self-pay | Admitting: Adult Health

## 2015-01-25 VITALS — BP 130/80 | Temp 98.6°F | Ht 69.0 in | Wt 199.5 lb

## 2015-01-25 DIAGNOSIS — E041 Nontoxic single thyroid nodule: Secondary | ICD-10-CM

## 2015-01-25 LAB — TSH: TSH: 5.67 u[IU]/mL — ABNORMAL HIGH (ref 0.35–4.50)

## 2015-01-25 LAB — T3, FREE: T3, Free: 3 pg/mL (ref 2.3–4.2)

## 2015-01-25 LAB — T4, FREE: FREE T4: 0.79 ng/dL (ref 0.60–1.60)

## 2015-01-25 NOTE — Progress Notes (Signed)
Pre visit review using our clinic review tool, if applicable. No additional management support is needed unless otherwise documented below in the visit note. 

## 2015-01-25 NOTE — Patient Instructions (Addendum)
I will follow up with you regarding the blood work. [facility] should call you on Monday to make an appointment. If you have any trouble breathing, swallowing, or feels as though your throat is closing then please go to the ER    Let me know if there is anything else I can do for you. It was great seeing you again.

## 2015-01-25 NOTE — Progress Notes (Signed)
Subjective:    Patient ID: Scott Vasquez, male    DOB: 1941-03-09, 74 y.o.   MRN: [id number]  HPI Patient presents to the office today with his wife with the complaint of "I' have a knot about the size of an egg under my neck". Since waking up this morning and noticing this lump patient is wife both endorse that the size of it has decreased.  On 11/08/2014 he had a ultrasound of his head and neck showed "Multiple bilateral lesions. Dominant lesion in the left lobe measures 5.1 cm." had a subsequent ultrasound thyroid biopsy on 11/21/2014, which showed "THE SPECIMEN IS PROCESSED BUT IS FOUND TO BE ESSENTIALLY ACELLULAR, THEREFORE UNSATISFACTORY FOR CYTOLOGIC EVALUATION." He was referred to general surgery, he did not end up going to his appointment because he did not want anybody performing surgery on him at that time.   Only complaint at this nodule right now is that he has a sore throat. He does endorse that every time the nodule enlarges that his throat become sore as soon as nodule decreases his throat becomes not sore. He is not having any difficulty breathing, he does not have any difficulty swallowing food, and he does not feel like his throat is closing.  Review of Systems  Constitutional: Negative.   HENT: Positive for sore throat. Negative for drooling, ear discharge, ear pain, hearing loss, nosebleeds, sinus pressure, trouble swallowing and voice change.        "lump on my throat".  Respiratory: Negative.   Cardiovascular: Negative.   Endocrine: Negative.   Musculoskeletal: Negative for neck pain and neck stiffness.  All other systems reviewed and are negative.  Past Medical History  Diagnosis Date  . Anemia     iron deficiency  . Diabetes mellitus     type II  . Hypertension   . Stroke   . MRSA (methicillin resistant Staphylococcus aureus)   . Diverticulosis of colon     History   Social History  . Marital Status: Married    Spouse Name: N/A  . Number of Children:  N/A  . Years of Education: N/A   Occupational History  . retired Paediatric nurse   Social History Main Topics  . Smoking status: Former Research scientist (life sciences)  . Smokeless tobacco: Not on file  . Alcohol Use: No  . Drug Use: No  . Sexual Activity: Not on file   Other Topics Concern  . Not on file   Social History Narrative   Works at [employer] for 10 years   - Married with two children who live locally   - No pets   - Attend church for fun.       Past Surgical History  Procedure Laterality Date  . Cataract extraction, bilateral    . Back surgery    . Shoulder surgery  08/2007    Supple    Family History  Problem Relation Age of Onset  . Diabetes    . Diabetes Father     Died from diabetic coma in 10/30/1970  . Stroke Mother     Died in 4  . Hypertension Mother   . Hypertension Father     No Known Allergies  Current Outpatient Prescriptions on File Prior to Visit  Medication Sig Dispense Refill  . amLODipine (NORVASC) 5 MG tablet Take 1 tablet (5 mg total) by mouth daily. 90 tablet 1  . atorvastatin (LIPITOR) 40 MG tablet TAKE ONE TABLET BY MOUTH DAILY 90 tablet 1  . Blood  Glucose Monitoring Suppl (ONE TOUCH ULTRA SYSTEM KIT) W/DEVICE KIT Use to check blood sugar twice a day 1 each 0  . clopidogrel (PLAVIX) 75 MG tablet TAKE ONE TABLET BY MOUTH DAILY. 90 tablet 1  . cyanocobalamin 100 MCG tablet Take 100 mcg by mouth daily.    Marland Kitchen glipiZIDE-metformin (METAGLIP) 5-500 MG per tablet TAKE TWO (2) TABLETS BY MOUTH TWICE DAILY BEFORE MEALS 120 tablet 5  . HUMULIN 70/30 (70-30) 100 UNIT/ML injection INJECT 10 UNITS SUBCUTANEOUSLY TWICE DAILY (BEFORE BREAKFAST AND DINNER) 10 mL 3  . Insulin Syringe-Needle U-100 (INSULIN SYRINGE 1CC/31GX5/16") 31G X 5/16" 1 ML MISC Use two times a day as directed.     Marland Kitchen JANUVIA 100 MG tablet TAKE ONE TABLET BY MOUTH DAILY 90 tablet 1  . linagliptin (TRADJENTA) 5 MG TABS tablet Take 1 tablet (5 mg total) by mouth daily. 30 tablet 6  . lisinopril (PRINIVIL,ZESTRIL) 40  MG tablet TAKE ONE TABLET ONCE DAILY 90 tablet 1  . meloxicam (MOBIC) 7.5 MG tablet TAKE ONE TABLET BY MOUTH ONCE DAILY 30 tablet 3  . ONE TOUCH ULTRA TEST test strip   0  . ONETOUCH DELICA LANCETS 35L MISC   0   No current facility-administered medications on file prior to visit.    BP 130/80 mmHg  Temp(Src) 98.6 F (37 C) (Oral)  Ht 5' 9"  (1.753 m)  Wt 199 lb 8 oz (90.493 kg)  BMI 29.45 kg/m2       Objective:   Physical Exam  Constitutional: He is oriented to person, place, and time. He appears well-developed and well-nourished. No distress.  HENT:  Head: Normocephalic and atraumatic.  Right Ear: External ear normal.  Left Ear: External ear normal.  Nose: Nose normal.  Mouth/Throat: Oropharynx is clear and moist. No oropharyngeal exudate.  Large hard mass on left side of thyroid. Easily felt and noticeable without palpation.   Cardiovascular: Normal rate, regular rhythm, normal heart sounds and intact distal pulses.  Exam reveals no gallop and no friction rub.   No murmur heard. Pulmonary/Chest: Effort normal and breath sounds normal. No respiratory distress. He has no wheezes. He has no rales. He exhibits no tenderness.  Musculoskeletal: Normal range of motion.  Neurological: He is alert and oriented to person, place, and time.  Skin: Skin is warm and dry. No rash noted. He is not diaphoretic. No erythema. No pallor.  Psychiatric: He has a normal mood and affect. His behavior is normal. Judgment and thought content normal.  Nursing note and vitals reviewed.      Assessment & Plan:  1. Thyroid nodule - TSH - T3, Free - T4, Free - Ambulatory referral to Endocrinology - Will follow up once labs have returned

## 2015-01-28 ENCOUNTER — Telehealth: Payer: Self-pay | Admitting: Adult Health

## 2015-01-28 NOTE — Telephone Encounter (Signed)
Spoke to patient regarding labs re: thyroid. He is establishing care with Endocrine on 02/22/2015

## 2015-02-11 ENCOUNTER — Other Ambulatory Visit: Payer: Self-pay | Admitting: Family

## 2015-02-22 ENCOUNTER — Telehealth: Payer: Self-pay | Admitting: Adult Health

## 2015-02-22 ENCOUNTER — Encounter: Payer: Self-pay | Admitting: Endocrinology

## 2015-02-22 ENCOUNTER — Other Ambulatory Visit (INDEPENDENT_AMBULATORY_CARE_PROVIDER_SITE_OTHER): Payer: BLUE CROSS/BLUE SHIELD

## 2015-02-22 ENCOUNTER — Ambulatory Visit (INDEPENDENT_AMBULATORY_CARE_PROVIDER_SITE_OTHER): Payer: BLUE CROSS/BLUE SHIELD | Admitting: Endocrinology

## 2015-02-22 ENCOUNTER — Other Ambulatory Visit (HOSPITAL_COMMUNITY)
Admission: RE | Admit: 2015-02-22 | Discharge: 2015-02-22 | Disposition: A | Discharge status: Home / Self Care | Payer: BLUE CROSS/BLUE SHIELD | Source: Ambulatory Visit | Attending: Endocrinology | Admitting: Endocrinology

## 2015-02-22 VITALS — BP 134/60 | HR 91 | Temp 98.1°F | Ht 69.0 in | Wt 198.0 lb

## 2015-02-22 DIAGNOSIS — E119 Type 2 diabetes mellitus without complications: Secondary | ICD-10-CM | POA: Diagnosis not present

## 2015-02-22 DIAGNOSIS — E041 Nontoxic single thyroid nodule: Secondary | ICD-10-CM

## 2015-02-22 LAB — HEMOGLOBIN A1C: Hgb A1c MFr Bld: 6.9 % — ABNORMAL HIGH (ref 4.6–6.5)

## 2015-02-22 LAB — TSH: TSH: 3.36 u[IU]/mL (ref 0.35–4.50)

## 2015-02-22 LAB — T4, FREE: Free T4: 0.78 ng/dL (ref 0.60–1.60)

## 2015-02-22 NOTE — Telephone Encounter (Signed)
Sorry about that, yes I would like to see him back in December for CPE

## 2015-02-22 NOTE — Patient Instructions (Signed)
blood tests are requested for you today.  We'll let you know about the results of this, and the biopsy. Please come back for a follow-up appointment in 2 months.

## 2015-02-22 NOTE — Progress Notes (Signed)
Subjective:    Patient ID: Scott Vasquez, male    DOB: 1940-11-27, 74 y.o.   MRN: [id number]  HPI Wife reports that in early 10-14-2014, pt was incidentally noted on a carotid US to have a thyroid mass.  He has no prior h/o thyroid problems.  he has no h/o XRT or surgery to the neck.  He has slight swelling at the anterior neck, but no assoc pain.   Past Medical History  Diagnosis Date  . Anemia     iron deficiency  . Diabetes mellitus     type II  . Hypertension   . Stroke   . MRSA (methicillin resistant Staphylococcus aureus)   . Diverticulosis of colon     Past Surgical History  Procedure Laterality Date  . Cataract extraction, bilateral    . Back surgery    . Shoulder surgery  08/2007    Supple    History   Social History  . Marital Status: Married    Spouse Name: N/A  . Number of Children: N/A  . Years of Education: N/A   Occupational History  . retired Paediatric nurse   Social History Main Topics  . Smoking status: Former Research scientist (life sciences)  . Smokeless tobacco: Not on file  . Alcohol Use: No  . Drug Use: No  . Sexual Activity: Not on file   Other Topics Concern  . Not on file   Social History Narrative   Works at [employer] for 10 years   - Married with two children who live locally   - No pets   - Attend church for fun.       Current Outpatient Prescriptions on File Prior to Visit  Medication Sig Dispense Refill  . amLODipine (NORVASC) 5 MG tablet Take 1 tablet (5 mg total) by mouth daily. 90 tablet 1  . atorvastatin (LIPITOR) 40 MG tablet TAKE ONE TABLET BY MOUTH DAILY 90 tablet 1  . Blood Glucose Monitoring Suppl (ONE TOUCH ULTRA SYSTEM KIT) W/DEVICE KIT Use to check blood sugar twice a day 1 each 0  . clopidogrel (PLAVIX) 75 MG tablet TAKE ONE TABLET BY MOUTH DAILY. 90 tablet 1  . cyanocobalamin 100 MCG tablet Take 100 mcg by mouth daily.    Marland Kitchen glipiZIDE-metformin (METAGLIP) 5-500 MG per tablet TAKE TWO (2) TABLETS BY MOUTH TWICE DAILY BEFORE MEALS 120 tablet 5  . HUMULIN  70/30 (70-30) 100 UNIT/ML injection INJECT 10 UNITS SUBCUTANEOUSLY TWICE DAILY (BEFORE BREAKFAST AND DINNER) 10 mL 5  . Insulin Syringe-Needle U-100 (INSULIN SYRINGE 1CC/31GX5/16") 31G X 5/16" 1 ML MISC Use two times a day as directed.     Marland Kitchen JANUVIA 100 MG tablet TAKE ONE TABLET BY MOUTH DAILY 90 tablet 1  . linagliptin (TRADJENTA) 5 MG TABS tablet Take 1 tablet (5 mg total) by mouth daily. 30 tablet 6  . lisinopril (PRINIVIL,ZESTRIL) 40 MG tablet TAKE ONE TABLET ONCE DAILY 90 tablet 1  . meloxicam (MOBIC) 7.5 MG tablet TAKE ONE TABLET BY MOUTH ONCE DAILY 30 tablet 3  . ONE TOUCH ULTRA TEST test strip   0  . ONETOUCH DELICA LANCETS 93Y MISC   0   No current facility-administered medications on file prior to visit.    No Known Allergies  Family History  Problem Relation Age of Onset  . Diabetes    . Diabetes Father     Died from diabetic coma in 1970-10-14  . Stroke Mother     Died in 6  . Hypertension Mother   .  Hypertension Father     BP 134/60 mmHg  Pulse 91  Temp(Src) 98.1 F (36.7 C) (Oral)  Ht 5' 9"  (1.753 m)  Wt 198 lb (89.812 kg)  BMI 29.23 kg/m2  SpO2 97%  Review of Systems denies dysphagia, depression, sob, weight gain, memory loss, constipation, blurry vision, cold intolerance, muscle weakness, easy bruising, and syncope.  He has intermittent leg cramps, rhinorrhea, and dry skin.  He attributes numbness of the feet to DM.      Objective:   Physical Exam VITAL SIGNS:  See vs page GENERAL: no distress eyes: no periorbital swelling, no proptosis NECK: large left thyroid mass is palpable NODES: none palpable at the neck.  Skin: not diaphoretic Neuro: no tremor Ext: no edema PSYCH: Alert and well-oriented.  Does not appear anxious nor depressed.   radiol (11/08/14): thyroid US: Multiple bilateral lesions. Dominant lesion in the left lobe measures 5.1 cm.  Lab Results  Component Value Date   TSH 3.36 02/22/2015    thyroid needle bx: consent obtained, signed  form on chart The area is first sprayed with cooling agent local: xylocaine 2%, with epinephrine prep: alcohol pad 4 bxs are done with 25 and 27g needles.  The texture of the mass is very firm no complications.     Assessment & Plan:  Thyroid mass, new to me.  In view of size and texture, he has high risk of malignancy, so he'll need resection, no matter what the bx result.    Patient is advised the following: Patient Instructions  blood tests are requested for you today.  We'll let you know about the results of this, and the biopsy. Please come back for a follow-up appointment in 2 months.

## 2015-02-22 NOTE — Telephone Encounter (Signed)
Scott Vasquez came in for lab today and wanted to know if he need to make an appointment for December. Per AVS there was no information about making an appointment for December. Patient would like to know if he needs an appointment.

## 2015-02-26 ENCOUNTER — Other Ambulatory Visit: Payer: Self-pay | Admitting: Family

## 2015-02-28 ENCOUNTER — Other Ambulatory Visit: Payer: Self-pay | Admitting: Endocrinology

## 2015-02-28 ENCOUNTER — Other Ambulatory Visit: Payer: Self-pay

## 2015-02-28 ENCOUNTER — Telehealth: Payer: Self-pay | Admitting: Endocrinology

## 2015-02-28 DIAGNOSIS — E041 Nontoxic single thyroid nodule: Secondary | ICD-10-CM

## 2015-02-28 NOTE — Telephone Encounter (Signed)
Pt requesting the biopsy results

## 2015-02-28 NOTE — Telephone Encounter (Signed)
Pt Biopsy results have not yet been entered into EPIC.

## 2015-03-13 ENCOUNTER — Other Ambulatory Visit: Payer: Self-pay | Admitting: Surgery

## 2015-03-13 DIAGNOSIS — R221 Localized swelling, mass and lump, neck: Secondary | ICD-10-CM

## 2015-03-14 ENCOUNTER — Other Ambulatory Visit: Payer: Self-pay | Admitting: Family

## 2015-03-14 ENCOUNTER — Telehealth: Payer: Self-pay | Admitting: Endocrinology

## 2015-03-14 NOTE — Telephone Encounter (Signed)
Received records from [facility] forwarded 3 pages to Dr. Renato Shin 03/14/15 fbg.

## 2015-03-18 ENCOUNTER — Telehealth: Payer: Self-pay | Admitting: Adult Health

## 2015-03-18 ENCOUNTER — Encounter: Payer: Self-pay | Admitting: Neurology

## 2015-03-18 NOTE — Telephone Encounter (Signed)
Hi, Katrina:  I have written a letter and put on your desk. You can fax over. Thanks.  Rosalin Hawking, MD PhD Stroke Neurology 03/18/2015 6:28 PM

## 2015-03-18 NOTE — Telephone Encounter (Signed)
Tommi Rumps would like Dr. Erlinda Hong to review and write the letter about Plavix. Pls advise.

## 2015-03-18 NOTE — Telephone Encounter (Signed)
Scott Vasquez from [facility] is needing a written order stating that Scott Vasquez can be off his clopidogrel (PLAVIX) 75 MG tablet for 5 days before his Thyroid Biopsy on April 02, 2015.

## 2015-03-18 NOTE — Telephone Encounter (Signed)
Pls advise.  

## 2015-03-22 ENCOUNTER — Telehealth: Payer: Self-pay

## 2015-03-22 MED ORDER — AMLODIPINE BESYLATE 5 MG PO TABS: 5.0000 mg | ORAL_TABLET | Freq: Every day | ORAL | Status: DC

## 2015-03-22 MED ORDER — ATORVASTATIN CALCIUM 40 MG PO TABS: 40.0000 mg | ORAL_TABLET | Freq: Every day | ORAL | Status: DC

## 2015-03-22 MED ORDER — LISINOPRIL 40 MG PO TABS: 40.0000 mg | ORAL_TABLET | Freq: Every day | ORAL | Status: DC

## 2015-03-22 NOTE — Telephone Encounter (Signed)
Received a call from the pharmacist at [facility] stating that pt's medications (amlodipine, atorvastatin, and lisinopril) were filled and delivered to him and the medications have been misplaced.  Ok to refill the medications for pt.  New rx sent to pharmacy.

## 2015-04-02 ENCOUNTER — Other Ambulatory Visit: Payer: BLUE CROSS/BLUE SHIELD

## 2015-04-04 ENCOUNTER — Ambulatory Visit
Admission: RE | Admit: 2015-04-04 | Discharge: 2015-04-04 | Disposition: A | Discharge status: Home / Self Care | Payer: BLUE CROSS/BLUE SHIELD | Source: Ambulatory Visit | Attending: Surgery | Admitting: Surgery

## 2015-04-04 ENCOUNTER — Other Ambulatory Visit (HOSPITAL_COMMUNITY)
Admission: RE | Admit: 2015-04-04 | Discharge: 2015-04-04 | Disposition: A | Discharge status: Home / Self Care | Payer: BLUE CROSS/BLUE SHIELD | Source: Ambulatory Visit | Attending: Interventional Radiology | Admitting: Interventional Radiology

## 2015-04-04 DIAGNOSIS — R221 Localized swelling, mass and lump, neck: Secondary | ICD-10-CM

## 2015-04-04 DIAGNOSIS — E041 Nontoxic single thyroid nodule: Secondary | ICD-10-CM | POA: Diagnosis present

## 2015-04-04 MED ORDER — IOPAMIDOL (ISOVUE-300) INJECTION 61%
75.0000 mL | Freq: Once | INTRAVENOUS | Status: AC | PRN
  Administered 2015-04-04: 75 mL via INTRAVENOUS

## 2015-04-05 ENCOUNTER — Other Ambulatory Visit: Payer: Self-pay | Admitting: Endocrinology

## 2015-04-05 DIAGNOSIS — E041 Nontoxic single thyroid nodule: Secondary | ICD-10-CM

## 2015-04-08 ENCOUNTER — Ambulatory Visit: Payer: Self-pay | Admitting: Surgery

## 2015-04-09 ENCOUNTER — Telehealth: Payer: Self-pay | Admitting: Endocrinology

## 2015-04-09 NOTE — Telephone Encounter (Signed)
Rec'd from [facility] forward 2 pages to [facility]

## 2015-04-26 ENCOUNTER — Encounter (HOSPITAL_COMMUNITY): Payer: Self-pay

## 2015-04-26 ENCOUNTER — Other Ambulatory Visit (HOSPITAL_COMMUNITY): Payer: BLUE CROSS/BLUE SHIELD

## 2015-04-26 ENCOUNTER — Ambulatory Visit: Payer: BLUE CROSS/BLUE SHIELD | Admitting: Endocrinology

## 2015-04-26 ENCOUNTER — Encounter (HOSPITAL_COMMUNITY)
Admission: RE | Admit: 2015-04-26 | Discharge: 2015-04-26 | Disposition: A | Discharge status: Home / Self Care | Payer: BLUE CROSS/BLUE SHIELD | Source: Ambulatory Visit | Attending: Surgery | Admitting: Surgery

## 2015-04-26 DIAGNOSIS — I10 Essential (primary) hypertension: Secondary | ICD-10-CM | POA: Diagnosis not present

## 2015-04-26 DIAGNOSIS — Z794 Long term (current) use of insulin: Secondary | ICD-10-CM | POA: Diagnosis not present

## 2015-04-26 DIAGNOSIS — R221 Localized swelling, mass and lump, neck: Secondary | ICD-10-CM | POA: Diagnosis present

## 2015-04-26 DIAGNOSIS — Z79899 Other long term (current) drug therapy: Secondary | ICD-10-CM | POA: Diagnosis not present

## 2015-04-26 DIAGNOSIS — E119 Type 2 diabetes mellitus without complications: Secondary | ICD-10-CM | POA: Diagnosis not present

## 2015-04-26 DIAGNOSIS — D34 Benign neoplasm of thyroid gland: Secondary | ICD-10-CM | POA: Diagnosis not present

## 2015-04-26 DIAGNOSIS — R1310 Dysphagia, unspecified: Secondary | ICD-10-CM | POA: Diagnosis not present

## 2015-04-26 DIAGNOSIS — Z87891 Personal history of nicotine dependence: Secondary | ICD-10-CM | POA: Diagnosis not present

## 2015-04-26 DIAGNOSIS — Z7902 Long term (current) use of antithrombotics/antiplatelets: Secondary | ICD-10-CM | POA: Diagnosis not present

## 2015-04-26 LAB — CBC
HEMATOCRIT: 31.8 % — AB (ref 39.0–52.0)
Hemoglobin: 9.9 g/dL — ABNORMAL LOW (ref 13.0–17.0)
MCH: 27.3 pg (ref 26.0–34.0)
MCHC: 31.1 g/dL (ref 30.0–36.0)
MCV: 87.8 fL (ref 78.0–100.0)
PLATELETS: 294 10*3/uL (ref 150–400)
RBC: 3.62 MIL/uL — ABNORMAL LOW (ref 4.22–5.81)
RDW: 14.5 % (ref 11.5–15.5)
WBC: 5.9 10*3/uL (ref 4.0–10.5)

## 2015-04-26 LAB — BASIC METABOLIC PANEL
Anion gap: 7 (ref 5–15)
BUN: 14 mg/dL (ref 6–20)
CHLORIDE: 107 mmol/L (ref 101–111)
CO2: 25 mmol/L (ref 22–32)
CREATININE: 1.28 mg/dL — AB (ref 0.61–1.24)
Calcium: 9.5 mg/dL (ref 8.9–10.3)
GFR calc Af Amer: >60 mL/min (ref >60)
GFR calc non Af Amer: 53 mL/min — ABNORMAL LOW (ref >60)
GLUCOSE: 99 mg/dL (ref 65–99)
Potassium: 4.2 mmol/L (ref 3.5–5.1)
SODIUM: 139 mmol/L (ref 135–145)

## 2015-04-26 LAB — GLUCOSE, CAPILLARY: GLUCOSE-CAPILLARY: 94 mg/dL (ref 65–99)

## 2015-04-26 NOTE — Pre-Procedure Instructions (Signed)
Scott Vasquez  04/26/2015      [facility] [place], [place], [place] - [address] [place] [address] Phone: [phone] Fax: [phone]    Your procedure is scheduled on  04-29-2015  Monday  .  Report to [facility] Admitting at 10:00 A.M.   Call this number if you have problems the morning of surgery:  [phone]   Remember:  Do not eat food or drink liquids after midnight.   Take these medicines the morning of surgery with A SIP OF WATER Amlodipine(Norvasc)              Follow MD instructions for Plavix              Do not take any diabetes medications the morning of surgery                Do not wear jewelry, make-up or nail polish.  Do not wear lotions, powders, or perfumes.  You may not wear deodorant.  Do not shave 48 hours prior to surgery.      Do not bring valuables to the hospital.  [facility] is not responsible for any belongings or valuables.  Contacts, dentures or bridgework may not be worn into surgery.  Leave your suitcase in the car.  After surgery it may be brought to your room.  For patients admitted to the hospital, discharge time will be determined by your treatment team.  Patients discharged the day of surgery will not be allowed to drive home.  Special instructions:  See attached sheet "Preparing for surgery " for instructions on CHG Shower  Please read over the following fact sheets that you were given. Pain Booklet and Surgical Site Infection Prevention   How to Manage Your Diabetes Before Surgery   Why is it important to control my blood sugar before and after surgery?   Improving blood sugar levels before and after surgery helps healing and can limit problems.  A way of improving blood sugar control is eating a healthy diet by:  - Eating less sugar and carbohydrates  - Increasing activity/exercise  - Talk with your doctor about reaching your blood sugar goals  High blood sugars  (greater than 180 mg/dL) can raise your risk of infections and slow down your recovery so you will need to focus on controlling your diabetes during the weeks before surgery.  Make sure that the doctor who takes care of your diabetes knows about your planned surgery including the date and location.  How do I manage my blood sugars before surgery?   Check your blood sugar at least 4 times a day, 2 days before surgery to make sure that they are not too high or low.   Check your blood sugar the morning of your surgery when you wake up and every 2    hours until you get to the Short-Stay unit.  If your blood sugar is less than 70 mg/dL, you will need to treat for low blood sugar by:  Treat a low blood sugar (less than 70 mg/dL) with 1/2 cup of clear juice (cranberry or apple), 4 glucose tablets, OR glucose gel.  Recheck blood sugar in 15 minutes after treatment (to make sure it is greater than 70 mg/dL).  If blood sugar is not greater than 70 mg/dL on re-check, call [phone] for further instructions.   Report your blood sugar to the Short-Stay nurse when you get to  Short-Stay.    What do I do about my diabetes medications?   Do not take oral diabetes medicines (pills) the morning of surgery.  THE NIGHT BEFORE SURGERY, take 7 units of  70/30 Insulin.    THE MORNING OF SURGERY, No insulin the morning of surgery       I    .

## 2015-04-27 LAB — HEMOGLOBIN A1C
Hgb A1c MFr Bld: 7.1 % — ABNORMAL HIGH (ref 4.8–5.6)
MEAN PLASMA GLUCOSE: 157 mg/dL

## 2015-04-28 ENCOUNTER — Encounter (HOSPITAL_COMMUNITY): Payer: Self-pay | Admitting: Surgery

## 2015-04-28 DIAGNOSIS — D440 Neoplasm of uncertain behavior of thyroid gland: Secondary | ICD-10-CM | POA: Diagnosis present

## 2015-04-28 NOTE — H&P (Signed)
  General Surgery [facility]  Scott Vasquez DOB: 03/09/1941 Married / Language: English / Race: Black or African American Male  History of Present Illness  The patient is a 74 year old male presenting to discuss diagnostic procedure results. Patient returns for follow-up having undergone CT scan of the neck and ultrasound-guided fine-needle aspiration biopsy of a left thyroid mass. CT scan of the neck revealed a dominant mass in the left thyroid lobe measuring 3.3 x 3.2 x 4.5 cm. There were rim calcifications. There were both liquid and solid components. Right thyroid lobe contained a 9 mm nodule. There was no significant lymphadenopathy. Fine-needle aspiration biopsy showed benign thyroid tissue consistent with thyroiditis. No evidence of malignancy was identified. Patient presents today accompanied by his wife and his sister. He states that over the past 3 weeks he has noticed that his dysphagia has become progressively worse. He is interested in proceeding with thyroid resection.   Allergies No Known Drug Allergies  Medication History Norvasc (5MG  Tablet, Oral) Active. Lipitor (40MG  Tablet, Oral) Active. Plavix (75MG  Tablet, Oral) Active. Cyanocobalamin (100MCG Tablet, Oral) Active. Metaglip (5-500MG  Tablet, Oral) Active. HumuLIN 70/30 ((70-30) 100UNIT/ML Suspension, Subcutaneous) Active. Tradjenta (5MG  Tablet, Oral) Active. Lisinopril (40MG  Tablet, Oral) Active. Meloxicam (7.5MG  Tablet, Oral) Active. One Touch Ultra Active. Medications Reconciled  Vitals Weight: 198.6 lb Height: 70in Body Surface Area: 2.11 m Body Mass Index: 28.5 kg/m Pulse: 86 (Regular)  BP: 164/78 (Sitting, Left Arm, Standard)  Physical Exam  General - appears comfortable, no distress; not diaphorectic  HEENT - normocephalic; sclerae clear, gaze conjugate; mucous membranes moist, dentition good; voice normal  Neck - asymmetric on extension; no  palpable anterior or posterior cervical adenopathy; right thyroid lobe without palpable abnormality; dominant mass occupying essentially the entire left thyroid lobe measuring approximately 5 cm in size, smooth, relatively fixed, nontender  Chest - clear bilaterally without rhonchi, rales, or wheeze  Cor - regular rhythm with normal rate; no significant murmur  Ext - non-tender without significant edema or lymphedema  Neuro - grossly intact; no tremor  Assessment & Plan  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  After thorough discussion, I have recommended left thyroid lobectomy for definitive diagnosis and for relief of the patient's compressive symptoms. I believe his risk of malignancy is low. We discussed the risk and benefits of the procedure including the potential for recurrent laryngeal nerve injury. We discussed the hospital stay to be anticipated. We discussed the location and size of his surgical incision. We discussed his postoperative recovery. We discussed the potential need for lifelong thyroid hormone supplementation. Patient and his family understand and wish to proceed.  The risks and benefits of the procedure have been discussed at length with the patient. The patient understands the proposed procedure, potential alternative treatments, and the course of recovery to be expected. All of the patient's questions have been answered at this time. The patient wishes to proceed with surgery.  Earnstine Regal, MD, [facility] Office: [phone]

## 2015-04-29 ENCOUNTER — Observation Stay (HOSPITAL_COMMUNITY)
Admission: RE | Admit: 2015-04-29 | Discharge: 2015-04-30 | Disposition: A | Discharge status: Home / Self Care | Payer: BLUE CROSS/BLUE SHIELD | Source: Ambulatory Visit | Attending: Surgery | Admitting: Surgery

## 2015-04-29 ENCOUNTER — Ambulatory Visit (HOSPITAL_COMMUNITY): Payer: BLUE CROSS/BLUE SHIELD | Admitting: Certified Registered Nurse Anesthetist

## 2015-04-29 ENCOUNTER — Encounter (HOSPITAL_COMMUNITY)
Admission: RE | Disposition: A | Discharge status: Home / Self Care | Payer: Self-pay | Source: Ambulatory Visit | Attending: Surgery

## 2015-04-29 ENCOUNTER — Encounter (HOSPITAL_COMMUNITY): Payer: Self-pay | Admitting: Certified Registered Nurse Anesthetist

## 2015-04-29 DIAGNOSIS — Z7902 Long term (current) use of antithrombotics/antiplatelets: Secondary | ICD-10-CM | POA: Insufficient documentation

## 2015-04-29 DIAGNOSIS — D34 Benign neoplasm of thyroid gland: Principal | ICD-10-CM | POA: Insufficient documentation

## 2015-04-29 DIAGNOSIS — D440 Neoplasm of uncertain behavior of thyroid gland: Secondary | ICD-10-CM | POA: Diagnosis present

## 2015-04-29 DIAGNOSIS — E119 Type 2 diabetes mellitus without complications: Secondary | ICD-10-CM | POA: Insufficient documentation

## 2015-04-29 DIAGNOSIS — R1310 Dysphagia, unspecified: Secondary | ICD-10-CM | POA: Insufficient documentation

## 2015-04-29 DIAGNOSIS — Z79899 Other long term (current) drug therapy: Secondary | ICD-10-CM | POA: Insufficient documentation

## 2015-04-29 DIAGNOSIS — Z794 Long term (current) use of insulin: Secondary | ICD-10-CM | POA: Insufficient documentation

## 2015-04-29 DIAGNOSIS — I10 Essential (primary) hypertension: Secondary | ICD-10-CM | POA: Insufficient documentation

## 2015-04-29 DIAGNOSIS — Z87891 Personal history of nicotine dependence: Secondary | ICD-10-CM | POA: Insufficient documentation

## 2015-04-29 DIAGNOSIS — E079 Disorder of thyroid, unspecified: Secondary | ICD-10-CM | POA: Diagnosis present

## 2015-04-29 HISTORY — DX: Nontoxic single thyroid nodule: E04.1

## 2015-04-29 HISTORY — PX: THYROID LOBECTOMY: SHX420

## 2015-04-29 LAB — GLUCOSE, CAPILLARY
GLUCOSE-CAPILLARY: 153 mg/dL — AB (ref 65–99)
Glucose-Capillary: 160 mg/dL — ABNORMAL HIGH (ref 65–99)

## 2015-04-29 SURGERY — LOBECTOMY, THYROID
Anesthesia: General | Site: Neck | Laterality: Left

## 2015-04-29 MED ORDER — 0.9 % SODIUM CHLORIDE (POUR BTL) OPTIME
TOPICAL | Status: DC | PRN
  Administered 2015-04-29: 1000 mL

## 2015-04-29 MED ORDER — SUGAMMADEX SODIUM 200 MG/2ML IV SOLN
INTRAVENOUS | Status: DC | PRN
  Administered 2015-04-29: 200 mg via INTRAVENOUS

## 2015-04-29 MED ORDER — SUCCINYLCHOLINE CHLORIDE 20 MG/ML IJ SOLN
INTRAMUSCULAR | Status: AC
  Filled 2015-04-29: qty 1

## 2015-04-29 MED ORDER — INSULIN ASPART 100 UNIT/ML ~~LOC~~ SOLN
0.0000 [IU] | Freq: Three times a day (TID) | SUBCUTANEOUS | Status: DC
  Administered 2015-04-30: 3 [IU] via SUBCUTANEOUS

## 2015-04-29 MED ORDER — ONDANSETRON HCL 4 MG/2ML IJ SOLN: 4.0000 mg | Freq: Once | INTRAMUSCULAR | Status: DC | PRN

## 2015-04-29 MED ORDER — KCL IN DEXTROSE-NACL 20-5-0.45 MEQ/L-%-% IV SOLN
INTRAVENOUS | Status: DC
  Administered 2015-04-29: 20:00:00 via INTRAVENOUS
  Filled 2015-04-29: qty 1000

## 2015-04-29 MED ORDER — NEOSTIGMINE METHYLSULFATE 10 MG/10ML IV SOLN
INTRAVENOUS | Status: AC
  Filled 2015-04-29: qty 1

## 2015-04-29 MED ORDER — PROPOFOL 10 MG/ML IV BOLUS
INTRAVENOUS | Status: AC
  Filled 2015-04-29: qty 20

## 2015-04-29 MED ORDER — CEFAZOLIN SODIUM-DEXTROSE 2-3 GM-% IV SOLR
2.0000 g | INTRAVENOUS | Status: AC
  Administered 2015-04-29: 2 g via INTRAVENOUS
  Filled 2015-04-29: qty 50

## 2015-04-29 MED ORDER — OXYCODONE HCL 5 MG PO TABS
5.0000 mg | ORAL_TABLET | Freq: Once | ORAL | Status: AC | PRN
Start: 2015-04-29 — End: 2015-04-29
  Administered 2015-04-29: 5 mg via ORAL

## 2015-04-29 MED ORDER — LISINOPRIL 40 MG PO TABS
40.0000 mg | ORAL_TABLET | Freq: Every day | ORAL | Status: DC
  Administered 2015-04-29 – 2015-04-30 (×2): 40 mg via ORAL
  Filled 2015-04-29 (×2): qty 1

## 2015-04-29 MED ORDER — ONDANSETRON HCL 4 MG/2ML IJ SOLN
INTRAMUSCULAR | Status: AC
  Filled 2015-04-29: qty 2

## 2015-04-29 MED ORDER — OXYCODONE HCL 5 MG/5ML PO SOLN: 5.0000 mg | Freq: Once | ORAL | Status: AC | PRN

## 2015-04-29 MED ORDER — LACTATED RINGERS IV SOLN
INTRAVENOUS | Status: DC | PRN
  Administered 2015-04-29 (×2): via INTRAVENOUS

## 2015-04-29 MED ORDER — FENTANYL CITRATE (PF) 100 MCG/2ML IJ SOLN
25.0000 ug | INTRAMUSCULAR | Status: DC | PRN
  Administered 2015-04-29 (×2): 50 ug via INTRAVENOUS

## 2015-04-29 MED ORDER — HEMOSTATIC AGENTS (NO CHARGE) OPTIME
TOPICAL | Status: DC | PRN
  Administered 2015-04-29: 1 via TOPICAL

## 2015-04-29 MED ORDER — ROCURONIUM BROMIDE 50 MG/5ML IV SOLN
INTRAVENOUS | Status: AC
  Filled 2015-04-29: qty 1

## 2015-04-29 MED ORDER — MIDAZOLAM HCL 2 MG/2ML IJ SOLN
INTRAMUSCULAR | Status: AC
  Filled 2015-04-29: qty 4

## 2015-04-29 MED ORDER — SUGAMMADEX SODIUM 200 MG/2ML IV SOLN
INTRAVENOUS | Status: AC
  Filled 2015-04-29: qty 2

## 2015-04-29 MED ORDER — OXYCODONE HCL 5 MG PO TABS
ORAL_TABLET | ORAL | Status: AC
  Filled 2015-04-29: qty 1

## 2015-04-29 MED ORDER — ONDANSETRON 4 MG PO TBDP: 4.0000 mg | ORAL_TABLET | Freq: Four times a day (QID) | ORAL | Status: DC | PRN

## 2015-04-29 MED ORDER — FENTANYL CITRATE (PF) 250 MCG/5ML IJ SOLN
INTRAMUSCULAR | Status: AC
  Filled 2015-04-29: qty 5

## 2015-04-29 MED ORDER — GLYCOPYRROLATE 0.2 MG/ML IJ SOLN
INTRAMUSCULAR | Status: AC
  Filled 2015-04-29: qty 3

## 2015-04-29 MED ORDER — ACETAMINOPHEN 325 MG PO TABS: 650.0000 mg | ORAL_TABLET | Freq: Four times a day (QID) | ORAL | Status: DC | PRN

## 2015-04-29 MED ORDER — PHENYLEPHRINE HCL 10 MG/ML IJ SOLN
10.0000 mg | INTRAMUSCULAR | Status: DC | PRN
  Administered 2015-04-29: 20 ug/min via INTRAVENOUS

## 2015-04-29 MED ORDER — LIDOCAINE HCL (CARDIAC) 20 MG/ML IV SOLN
INTRAVENOUS | Status: AC
  Filled 2015-04-29: qty 5

## 2015-04-29 MED ORDER — PROPOFOL 10 MG/ML IV BOLUS
INTRAVENOUS | Status: DC | PRN
  Administered 2015-04-29: 150 mg via INTRAVENOUS

## 2015-04-29 MED ORDER — HYDROCODONE-ACETAMINOPHEN 5-325 MG PO TABS
ORAL_TABLET | ORAL | Status: AC
  Administered 2015-04-30: 2 via ORAL
  Filled 2015-04-29: qty 2

## 2015-04-29 MED ORDER — ROCURONIUM BROMIDE 100 MG/10ML IV SOLN
INTRAVENOUS | Status: DC | PRN
  Administered 2015-04-29: 10 mg via INTRAVENOUS
  Administered 2015-04-29: 50 mg via INTRAVENOUS

## 2015-04-29 MED ORDER — LACTATED RINGERS IV SOLN
INTRAVENOUS | Status: DC
Start: 2015-04-29 — End: 2015-04-29
  Administered 2015-04-29: 10:00:00 via INTRAVENOUS

## 2015-04-29 MED ORDER — HYDROMORPHONE HCL 1 MG/ML IJ SOLN: 1.0000 mg | INTRAMUSCULAR | Status: DC | PRN

## 2015-04-29 MED ORDER — EPHEDRINE SULFATE 50 MG/ML IJ SOLN
INTRAMUSCULAR | Status: AC
  Filled 2015-04-29: qty 1

## 2015-04-29 MED ORDER — HYDROCODONE-ACETAMINOPHEN 5-325 MG PO TABS
1.0000 | ORAL_TABLET | ORAL | Status: DC | PRN
  Administered 2015-04-29 – 2015-04-30 (×4): 2 via ORAL
  Filled 2015-04-29 (×3): qty 2

## 2015-04-29 MED ORDER — ONDANSETRON HCL 4 MG/2ML IJ SOLN
INTRAMUSCULAR | Status: DC | PRN
  Administered 2015-04-29: 4 mg via INTRAVENOUS

## 2015-04-29 MED ORDER — FENTANYL CITRATE (PF) 100 MCG/2ML IJ SOLN
INTRAMUSCULAR | Status: DC | PRN
  Administered 2015-04-29: 150 ug via INTRAVENOUS
  Administered 2015-04-29 (×2): 50 ug via INTRAVENOUS

## 2015-04-29 MED ORDER — ACETAMINOPHEN 650 MG RE SUPP: 650.0000 mg | Freq: Four times a day (QID) | RECTAL | Status: DC | PRN

## 2015-04-29 MED ORDER — SODIUM CHLORIDE 0.9 % IJ SOLN
INTRAMUSCULAR | Status: AC
  Filled 2015-04-29: qty 10

## 2015-04-29 MED ORDER — FENTANYL CITRATE (PF) 100 MCG/2ML IJ SOLN
INTRAMUSCULAR | Status: AC
  Administered 2015-04-29: 50 ug via INTRAVENOUS
  Filled 2015-04-29: qty 2

## 2015-04-29 MED ORDER — LIDOCAINE HCL (CARDIAC) 20 MG/ML IV SOLN
INTRAVENOUS | Status: DC | PRN
  Administered 2015-04-29: 40 mg via INTRAVENOUS

## 2015-04-29 MED ORDER — ONDANSETRON HCL 4 MG/2ML IJ SOLN: 4.0000 mg | Freq: Four times a day (QID) | INTRAMUSCULAR | Status: DC | PRN

## 2015-04-29 MED ORDER — LINAGLIPTIN 5 MG PO TABS
5.0000 mg | ORAL_TABLET | Freq: Every day | ORAL | Status: DC
  Administered 2015-04-29 – 2015-04-30 (×2): 5 mg via ORAL
  Filled 2015-04-29 (×2): qty 1

## 2015-04-29 MED ORDER — AMLODIPINE BESYLATE 5 MG PO TABS
5.0000 mg | ORAL_TABLET | Freq: Every day | ORAL | Status: DC
  Administered 2015-04-30: 5 mg via ORAL
  Filled 2015-04-29: qty 1

## 2015-04-29 SURGICAL SUPPLY — 62 items
ATTRACTOMAT 16X20 MAGNETIC DRP (DRAPES) ×3 IMPLANT
BENZOIN TINCTURE PRP APPL 2/3 (GAUZE/BANDAGES/DRESSINGS) ×3 IMPLANT
BLADE SURG 10 STRL SS (BLADE) ×3 IMPLANT
BLADE SURG 15 STRL LF DISP TIS (BLADE) ×1 IMPLANT
BLADE SURG 15 STRL SS (BLADE) ×2
BLADE SURG ROTATE 9660 (MISCELLANEOUS) IMPLANT
CANISTER SUCTION 2500CC (MISCELLANEOUS) ×3 IMPLANT
CHLORAPREP W/TINT 10.5 ML (MISCELLANEOUS) ×3 IMPLANT
CLIP TI MEDIUM 24 (CLIP) ×3 IMPLANT
CLIP TI WIDE RED SMALL 24 (CLIP) ×3 IMPLANT
CLOSURE WOUND 1/2 X4 (GAUZE/BANDAGES/DRESSINGS) ×1
CLOSURE WOUND 1/4X4 (GAUZE/BANDAGES/DRESSINGS) ×1
CONT SPEC 4OZ CLIKSEAL STRL BL (MISCELLANEOUS) ×3 IMPLANT
COVER SURGICAL LIGHT HANDLE (MISCELLANEOUS) ×3 IMPLANT
CRADLE DONUT ADULT HEAD (MISCELLANEOUS) ×3 IMPLANT
DRAIN CHANNEL 7F 3/4 FLAT (WOUND CARE) ×3 IMPLANT
DRAPE PED LAPAROTOMY (DRAPES) ×3 IMPLANT
DRAPE UTILITY XL STRL (DRAPES) ×3 IMPLANT
ELECT CAUTERY BLADE 6.4 (BLADE) ×3 IMPLANT
ELECT REM PT RETURN 9FT ADLT (ELECTROSURGICAL) ×3
ELECTRODE REM PT RTRN 9FT ADLT (ELECTROSURGICAL) ×1 IMPLANT
EVACUATOR SILICONE 100CC (DRAIN) ×3 IMPLANT
GAUZE SPONGE 4X4 12PLY STRL (GAUZE/BANDAGES/DRESSINGS) ×3 IMPLANT
GAUZE SPONGE 4X4 16PLY XRAY LF (GAUZE/BANDAGES/DRESSINGS) ×6 IMPLANT
GLOVE BIO SURGEON STRL SZ7.5 (GLOVE) ×3 IMPLANT
GLOVE BIOGEL PI IND STRL 7.0 (GLOVE) ×1 IMPLANT
GLOVE BIOGEL PI IND STRL 7.5 (GLOVE) ×1 IMPLANT
GLOVE BIOGEL PI INDICATOR 7.0 (GLOVE) ×2
GLOVE BIOGEL PI INDICATOR 7.5 (GLOVE) ×2
GLOVE BIOGEL PI ORTHO PRO SZ7 (GLOVE) ×2
GLOVE ECLIPSE 7.5 STRL STRAW (GLOVE) ×3 IMPLANT
GLOVE PI ORTHO PRO STRL SZ7 (GLOVE) ×1 IMPLANT
GLOVE SURG ORTHO 8.0 STRL STRW (GLOVE) ×3 IMPLANT
GLOVE SURG SS PI 6.5 STRL IVOR (GLOVE) ×6 IMPLANT
GLOVE SURG SS PI 7.0 STRL IVOR (GLOVE) ×3 IMPLANT
GOWN STRL REUS W/ TWL LRG LVL3 (GOWN DISPOSABLE) ×2 IMPLANT
GOWN STRL REUS W/ TWL XL LVL3 (GOWN DISPOSABLE) ×1 IMPLANT
GOWN STRL REUS W/TWL LRG LVL3 (GOWN DISPOSABLE) ×4
GOWN STRL REUS W/TWL XL LVL3 (GOWN DISPOSABLE) ×2
HEMOSTAT SURGICEL 2X4 FIBR (HEMOSTASIS) ×3 IMPLANT
KIT BASIN OR (CUSTOM PROCEDURE TRAY) ×3 IMPLANT
KIT ROOM TURNOVER OR (KITS) ×3 IMPLANT
NS IRRIG 1000ML POUR BTL (IV SOLUTION) ×3 IMPLANT
PACK SURGICAL SETUP 50X90 (CUSTOM PROCEDURE TRAY) ×3 IMPLANT
PAD ARMBOARD 7.5X6 YLW CONV (MISCELLANEOUS) ×3 IMPLANT
PENCIL BUTTON HOLSTER BLD 10FT (ELECTRODE) ×3 IMPLANT
SHEARS HARMONIC 9CM CVD (BLADE) ×3 IMPLANT
SPECIMEN JAR MEDIUM (MISCELLANEOUS) IMPLANT
SPONGE INTESTINAL PEANUT (DISPOSABLE) ×3 IMPLANT
STRIP CLOSURE SKIN 1/2X4 (GAUZE/BANDAGES/DRESSINGS) ×2 IMPLANT
STRIP CLOSURE SKIN 1/4X4 (GAUZE/BANDAGES/DRESSINGS) ×2 IMPLANT
SUT ETHILON 3 0 PS 1 (SUTURE) ×3 IMPLANT
SUT MNCRL AB 4-0 PS2 18 (SUTURE) ×3 IMPLANT
SUT SILK 2 0 (SUTURE) ×2
SUT SILK 2-0 18XBRD TIE 12 (SUTURE) ×1 IMPLANT
SUT VIC AB 3-0 SH 18 (SUTURE) ×3 IMPLANT
SYR BULB 3OZ (MISCELLANEOUS) ×3 IMPLANT
TAPE CLOTH SURG 6X10 WHT LF (GAUZE/BANDAGES/DRESSINGS) ×3 IMPLANT
TOWEL OR 17X24 6PK STRL BLUE (TOWEL DISPOSABLE) ×3 IMPLANT
TOWEL OR 17X26 10 PK STRL BLUE (TOWEL DISPOSABLE) ×3 IMPLANT
TUBE CONNECTING 12'X1/4 (SUCTIONS) ×1
TUBE CONNECTING 12X1/4 (SUCTIONS) ×2 IMPLANT

## 2015-04-29 NOTE — Anesthesia Postprocedure Evaluation (Signed)
  Anesthesia Post-op Note  Patient: Scott Vasquez  Procedure(s) Performed: Procedure(s): THYROID LOBECTOMY (Left)  Patient Location: PACU  Anesthesia Type:General  Level of Consciousness: awake, alert  and oriented  Airway and Oxygen Therapy: Patient Spontanous Breathing and Patient connected to nasal cannula oxygen  Post-op Pain: mild  Post-op Assessment: Post-op Vital signs reviewed, Patient's Cardiovascular Status Stable, Respiratory Function Stable, Patent Airway and Pain level controlled              Post-op Vital Signs: stable  Last Vitals:  Filed Vitals:   04/29/15 1412  BP: 168/76  Pulse: 74  Temp:   Resp: 17    Complications: No apparent anesthesia complications

## 2015-04-29 NOTE — Interval H&P Note (Signed)
History and Physical Interval Note:  04/29/2015 10:05 AM  Scott Vasquez  has presented today for surgery, with the diagnosis of LEFT THYROID MASS.  The various methods of treatment have been discussed with the patient and family. After consideration of risks, benefits and other options for treatment, the patient has consented to    Procedure(s): THYROID LOBECTOMY (Left) as a surgical intervention .    The patient's history has been reviewed, patient examined, no change in status, stable for surgery.  I have reviewed the patient's chart and labs.  Questions were answered to the patient's satisfaction.    Earnstine Regal, MD, [facility] Office: [place]

## 2015-04-29 NOTE — Anesthesia Procedure Notes (Addendum)
Procedure Name: Intubation Date/Time: 04/29/2015 10:34 AM Performed by: Ollen Bowl Pre-anesthesia Checklist: Patient identified, Emergency Drugs available, Suction available, Patient being monitored and Timeout performed Patient Re-evaluated:Patient Re-evaluated prior to inductionOxygen Delivery Method: Circle system utilized Preoxygenation: Pre-oxygenation with 100% oxygen Intubation Type: Combination inhalational/ intravenous induction and Cricoid Pressure applied Ventilation: Two handed mask ventilation required Laryngoscope Size: Mac and 4 Tube type: Oral Tube size: 7.5 mm Number of attempts: 1 Airway Equipment and Method: Stylet and Oral airway Placement Confirmation: ETT inserted through vocal cords under direct vision,  positive ETCO2 and breath sounds checked- equal and bilateral Secured at: 22 cm Tube secured with: Tape Dental Injury: Teeth and Oropharynx as per pre-operative assessment  Comments: Performed by Berna Spare, SRNA

## 2015-04-29 NOTE — Transfer of Care (Signed)
Immediate Anesthesia Transfer of Care Note  Patient: Scott Vasquez  Procedure(s) Performed: Procedure(s): THYROID LOBECTOMY (Left)  Patient Location: PACU  Anesthesia Type:General  Level of Consciousness: awake, alert  and oriented  Airway & Oxygen Therapy: Patient Spontanous Breathing and Patient connected to face mask oxygen  Post-op Assessment: Report given to RN, Post -op Vital signs reviewed and stable and Patient moving all extremities X 4  Post vital signs: Reviewed and stable  Last Vitals:  Filed Vitals:   04/29/15 1225  BP: 158/78  Pulse: 78  Temp: 36.6 C  Resp: 17    Complications: No apparent anesthesia complications

## 2015-04-29 NOTE — Anesthesia Preprocedure Evaluation (Addendum)
Anesthesia Evaluation  Patient identified by MRN, date of birth, ID band Patient awake    Reviewed: Allergy & Precautions, NPO status , Patient's Chart, lab work & pertinent test results  Airway Mallampati: II  TM Distance: >3 FB Neck ROM: Full    Dental  (+) Teeth Intact, Dental Advisory Given   Pulmonary former smoker,    breath sounds clear to auscultation       Cardiovascular hypertension,  Rhythm:Regular Rate:Normal     Neuro/Psych    GI/Hepatic   Endo/Other  diabetes  Renal/GU      Musculoskeletal   Abdominal   Peds  Hematology   Anesthesia Other Findings   Reproductive/Obstetrics                            Anesthesia Physical Anesthesia Plan  ASA: III  Anesthesia Plan: General   Post-op Pain Management:    Induction: Intravenous  Airway Management Planned: Oral ETT  Additional Equipment:   Intra-op Plan:   Post-operative Plan: Extubation in OR  Informed Consent: I have reviewed the patients History and Physical, chart, labs and discussed the procedure including the risks, benefits and alternatives for the proposed anesthesia with the patient or authorized representative who has indicated his/her understanding and acceptance.   Dental advisory given  Plan Discussed with: CRNA and Anesthesiologist  Anesthesia Plan Comments: (Goiter Type 2 DM 94 Hypertension  Plan GA with oral ETT  Roberts Gaudy)        Anesthesia Quick Evaluation

## 2015-04-29 NOTE — Op Note (Signed)
Procedure Note  Pre-operative Diagnosis:  Left thyroid mass  Post-operative Diagnosis:  same  Surgeon:  Earnstine Regal, MD, FACS  Assistant:  Autumn Messing, MD, FACS   Procedure:  Left thyroid lobectomy  Anesthesia:  General  Estimated Blood Loss:  minimal  Drains: 36mm JP drain         Specimen: thyroid lobe to pathology  Indications:  The patient is a 74 year old male presenting to discuss diagnostic procedure results. Patient returns for follow-up having undergone CT scan of the neck and ultrasound-guided fine-needle aspiration biopsy of a left thyroid mass. CT scan of the neck revealed a dominant mass in the left thyroid lobe measuring 3.3 x 3.2 x 4.5 cm. There were rim calcifications. There were both liquid and solid components. Right thyroid lobe contained a 9 mm nodule. There was no significant lymphadenopathy. Fine-needle aspiration biopsy showed benign thyroid tissue consistent with thyroiditis. No evidence of malignancy was identified. Patient presents today accompanied by his wife and his sister. He states that over the past 3 weeks he has noticed that his dysphagia has become progressively worse. He is interested in proceeding with thyroid resection.  Procedure Details: Procedure was done in OR #9 at the [facility]  The patient was brought to the operating room and placed in a supine position on the operating room table.  Following administration of general anesthesia, the patient was positioned and then prepped and draped in the usual aseptic fashion.  After ascertaining that an adequate level of anesthesia had been achieved, a Kocher incision was made with #15 blade.  Dissection was carried through subcutaneous tissues and platysma. Hemostasis was achieved with the electrocautery.  Skin flaps were elevated cephalad and caudad from the thyroid notch to the sternal notch.  A self-retaining retractor was placed for exposure.  Strap muscles were incised in the  midline and dissection was begun on the left side.  Strap muscles were reflected laterally.  The left thyroid lobe was quite firm, dense, and fixed to the surrounding tissues.  The lobe was mobilized with blunt dissection.  Superior pole vessels were dissected out and divided individually between small and medium Ligaclips with the Harmonic scalpel.  The thyroid lobe was rolled anteriorly.  Branches of the inferior thyroid artery were divided between small Ligaclips with the Harmonic scalpel.  Inferior venous tributaries were divided between Ligaclips.  The "capsule" of the mass was punctured and yellowish thick fluid drained from the mass.  Aerobic and anaerobic cultures were obtained and submitted to the lab.  The ligament of Gwenlyn Found was released with the electrocautery and the gland was mobilized off of the anterior trachea and left cricothyroideus muscle.  The inflammatory or neoplastic process appeared to involve the thyroid cartilage and hypopharynx. Isthmus was mobilized across the midline.  There was no pyramidal lobe present.  The thyroid parenchyma was transected at the junction of the isthmus and contralateral thyroid lobe with the Harmonic scalpel.  The thyroid lobe and isthmus were submitted to pathology for review.  The neck was irrigated with warm saline.  Fibular was placed throughout the operative field.  A 57mm JP drain was brought in from the left and secured to the skin with a 4-0 nylon suture.  Strap muscles were reapproximated in the midline with interrupted 3-0 Vicryl sutures.  Platysma was closed with interrupted 3-0 Vicryl sutures.  Skin was closed with a running 4-0 Monocryl subcuticular suture.  Wound was washed and dried and benzoin and steri-strips  were applied.  Drain was placed to bulb suction.  Dry gauze dressing was placed.  The patient was awakened from anesthesia and brought to the recovery room.  The patient tolerated the procedure well.   Earnstine Regal, MD, [facility] Office: [phone]

## 2015-04-30 ENCOUNTER — Encounter (HOSPITAL_COMMUNITY): Payer: Self-pay | Admitting: General Practice

## 2015-04-30 DIAGNOSIS — D34 Benign neoplasm of thyroid gland: Secondary | ICD-10-CM | POA: Diagnosis not present

## 2015-04-30 LAB — GLUCOSE, CAPILLARY: GLUCOSE-CAPILLARY: 165 mg/dL — AB (ref 65–99)

## 2015-04-30 MED ORDER — HYDROCODONE-ACETAMINOPHEN 5-325 MG PO TABS: 1.0000 | ORAL_TABLET | ORAL | Status: DC | PRN

## 2015-04-30 NOTE — Discharge Summary (Signed)
Physician Discharge Summary [facility]  Patient ID: Scott Vasquez MRN: [id number] DOB/AGE: [birth year] 74 y.o.  Admit date: 04/29/2015 Discharge date: 04/30/2015  Admission Diagnoses:  Left thyroid mass  Discharge Diagnoses:  Principal Problem:   Neoplasm of uncertain behavior of thyroid gland Active Problems:   Thyroid mass   Discharged Condition: good  Hospital Course: Patient was admitted for observation following thyroid surgery.  Post op course was uncomplicated.  Pain was well controlled.  Tolerated diet.  Instructed in drain care.  Patient was prepared for discharge home on POD#1.  Consults: None  Treatments: surgery: left thyroid lobectomy  Discharge Exam: Blood pressure 148/75, pulse 69, temperature 98.5 F (36.9 C), temperature source Oral, resp. rate 16, height 5' 11"  (1.803 m), weight 90.124 kg (198 lb 11 oz), SpO2 100 %. HEENT - clear Neck - wound dry and intact; small serosanguinous in JP drain; voice with moderate hoarseness Chest - clear bilaterally Cor - RRR  Disposition: Home  Discharge Instructions    Diet - low sodium heart healthy    Complete by:  As directed      Discharge instructions    Complete by:  As directed   [facility]  THYROID & PARATHYROID SURGERY:  POST-OP INSTRUCTIONS  Always review your discharge instruction sheet from the facility where your surgery was performed.  A prescription for pain medication may be given to you upon discharge.  Take your pain medication as prescribed.  If narcotic pain medicine is not needed, then you may take acetaminophen (Tylenol) or ibuprofen (Advil) as needed.  Take your usually prescribed medications unless otherwise directed.  If you need a refill on your pain medication, please contact your pharmacy. They will contact our office to request authorization.  Prescriptions will not be processed by our office after 5 pm or on weekends.  Start with a light diet upon  arrival home, such as soup and crackers or toast.  Be sure to drink plenty of fluids daily.  Resume your normal diet the day after surgery.  Most patients will experience some swelling and bruising on the chest and neck area.  Ice packs will help.  Swelling and bruising can take several days to resolve.   It is common to experience some constipation after surgery.  Increasing fluid intake and taking a stool softener will usually help or prevent this problem.  A mild laxative (Milk of Magnesia or Miralax) should be taken according to package directions if there has been no bowel movement after 48 hours.  You have steri-strips and a gauze dressing over your incision.  You may remove the gauze bandage on the second day after surgery, and you may shower at that time.  Leave your steri-strips (small skin tapes) in place directly over the incision.  These strips should remain on the skin for 5-7 days and then be removed.  You may get them wet in the shower and pat them dry.  You may resume regular (light) daily activities beginning the next day - such as daily self-care, walking, climbing stairs - gradually increasing activities as tolerated.  You may have sexual intercourse when it is comfortable.  Refrain from any heavy lifting or straining until approved by your doctor.  You may drive when you no longer are taking prescription pain medication, you can comfortably wear a seatbelt, and you can safely maneuver your car and apply brakes.  You should see your doctor in the office for a follow-up appointment  approximately two to three weeks after your surgery.  Make sure that you call for this appointment within a day or two after you arrive home to insure a convenient appointment time.  WHEN TO CALL YOUR DOCTOR: -- Fever greater than 101.5 -- Inability to urinate -- Nausea and/or vomiting - persistent -- Extreme swelling or bruising -- Continued bleeding from incision -- Increased pain, redness, or  drainage from the incision -- Difficulty swallowing or breathing -- Muscle cramping or spasms -- Numbness or tingling in hands or around lips  The clinic staff is available to answer your questions during regular business hours.  Please don't hesitate to call and ask to speak to one of the nurses if you have concerns.  Earnstine Regal, MD, [facility] Office: [phone]  Website: [email or web address]     Discharge wound care:    Complete by:  As directed   Instruct patient in drain care.  Place light gauze dressing on wound before discharge home today. Earnstine Regal, MD, [facility] Office: [phone]     Increase activity slowly    Complete by:  As directed      Remove dressing in 24 hours    Complete by:  As directed             Medication List    TAKE these medications        amLODipine 5 MG tablet  Commonly known as:  NORVASC  Take 1 tablet (5 mg total) by mouth daily.     atorvastatin 40 MG tablet  Commonly known as:  LIPITOR  Take 1 tablet (40 mg total) by mouth daily.     clopidogrel 75 MG tablet  Commonly known as:  PLAVIX  TAKE ONE TABLET BY MOUTH DAILY.     cyanocobalamin 100 MCG tablet  Take 100 mcg by mouth daily.     glipiZIDE-metformin 5-500 MG per tablet  Commonly known as:  METAGLIP  TAKE TWO (2) TABLETS BY MOUTH TWICE DAILY BEFORE MEALS     HUMULIN 70/30 (70-30) 100 UNIT/ML injection  Generic drug:  insulin NPH-regular Human  INJECT 10 UNITS SUBCUTANEOUSLY TWICE DAILY (BEFORE BREAKFAST AND DINNER)     HYDROcodone-acetaminophen 5-325 MG per tablet  Commonly known as:  NORCO/VICODIN  Take 1-2 tablets by mouth every 4 (four) hours as needed for moderate pain.     INSULIN SYRINGE 1CC/31GX5/16" 31G X 5/16" 1 ML Misc  Use two times a day as directed.     JANUVIA 100 MG tablet  Generic drug:  sitaGLIPtin  TAKE ONE TABLET BY MOUTH DAILY     linagliptin 5  MG Tabs tablet  Commonly known as:  TRADJENTA  Take 1 tablet (5 mg total) by mouth daily.     lisinopril 40 MG tablet  Commonly known as:  PRINIVIL,ZESTRIL  Take 1 tablet (40 mg total) by mouth daily.     meloxicam 7.5 MG tablet  Commonly known as:  MOBIC  TAKE ONE TABLET BY MOUTH ONCE DAILY     ONE TOUCH ULTRA SYSTEM KIT W/DEVICE Kit  Use to check blood sugar twice a day     ONE TOUCH ULTRA TEST test strip  Generic drug:  glucose blood  1 each by Other route See admin instructions. Check blood sugar twice     ONETOUCH DELICA LANCETS 80H Misc  1 each by Other route See admin instructions. Check blood sugar twice daily  Follow-up Information    Schedule an appointment as soon as possible for a visit with Earnstine Regal, MD.   Specialty:  General Surgery   Why:  For wound re-check and drain removal at [place] office   Contact information:   [place] [address] [phone]       Earnstine Regal, MD, [facility] Office: [phone]   Signed: Earnstine Regal 04/30/2015, 8:06 AM

## 2015-04-30 NOTE — Progress Notes (Signed)
Pt discharged to home.  Discharge instructions explained to pt.   Pt has no questions at the time of discharge.  Demonstrated for son and patient how to empty JP drain.  Pt states he has all belongings.  IV dc'd.  Pt ambulated off floor on his own.

## 2015-05-02 LAB — WOUND CULTURE: Culture: NO GROWTH

## 2015-05-03 ENCOUNTER — Other Ambulatory Visit: Payer: Self-pay | Admitting: *Deleted

## 2015-05-03 ENCOUNTER — Telehealth: Payer: Self-pay | Admitting: Adult Health

## 2015-05-03 MED ORDER — MELOXICAM 7.5 MG PO TABS: ORAL_TABLET | ORAL | Status: DC

## 2015-05-03 NOTE — Telephone Encounter (Signed)
Pharmacy call to say that pt insurance is not paying for the combination but will pay for it separate. Pharmacy is asking for 2 separate rx 's for the following med    glipiZIDE-metformin (METAGLIP) 5-500 MG per tablet    [facility] in [place]

## 2015-05-03 NOTE — Telephone Encounter (Signed)
error 

## 2015-05-04 LAB — ANAEROBIC CULTURE

## 2015-05-06 NOTE — Telephone Encounter (Signed)
yes

## 2015-05-06 NOTE — Telephone Encounter (Signed)
Ok to change medication and send to pharmacy?

## 2015-05-07 MED ORDER — METFORMIN HCL 500 MG PO TABS: ORAL_TABLET | ORAL | Status: DC

## 2015-05-07 MED ORDER — GLIPIZIDE 5 MG PO TABS: ORAL_TABLET | ORAL | Status: DC

## 2015-05-07 NOTE — Telephone Encounter (Signed)
Rx sent to pharmacy. Per Tommi Rumps send in glipizide 5 mg- take 2 tablets twice daily and metformin 500 mg take 2 tablets twice daily.

## 2015-05-15 ENCOUNTER — Other Ambulatory Visit: Payer: Self-pay | Admitting: Family

## 2015-06-07 ENCOUNTER — Ambulatory Visit (INDEPENDENT_AMBULATORY_CARE_PROVIDER_SITE_OTHER): Payer: BLUE CROSS/BLUE SHIELD

## 2015-06-07 DIAGNOSIS — Z23 Encounter for immunization: Secondary | ICD-10-CM | POA: Diagnosis not present

## 2015-06-10 ENCOUNTER — Telehealth: Payer: Self-pay | Admitting: Adult Health

## 2015-06-10 DIAGNOSIS — E1149 Type 2 diabetes mellitus with other diabetic neurological complication: Secondary | ICD-10-CM

## 2015-06-10 MED ORDER — ONETOUCH ULTRA BLUE VI STRP: ORAL_STRIP | Status: DC

## 2015-06-10 NOTE — Telephone Encounter (Signed)
Rx sent to pharmacy   

## 2015-06-10 NOTE — Telephone Encounter (Signed)
Pt needs new rx for one touch test strips sent to [facility] in  [place]. Pt check bs twice a day.

## 2015-07-20 ENCOUNTER — Other Ambulatory Visit: Payer: Self-pay | Admitting: Adult Health

## 2015-07-26 ENCOUNTER — Ambulatory Visit (INDEPENDENT_AMBULATORY_CARE_PROVIDER_SITE_OTHER): Payer: BLUE CROSS/BLUE SHIELD | Admitting: Adult Health

## 2015-07-26 ENCOUNTER — Encounter: Payer: Self-pay | Admitting: Adult Health

## 2015-07-26 VITALS — BP 138/70 | Temp 98.2°F | Ht 69.0 in | Wt 197.0 lb

## 2015-07-26 DIAGNOSIS — Z0000 Encounter for general adult medical examination without abnormal findings: Secondary | ICD-10-CM | POA: Diagnosis not present

## 2015-07-26 DIAGNOSIS — I10 Essential (primary) hypertension: Secondary | ICD-10-CM | POA: Diagnosis not present

## 2015-07-26 DIAGNOSIS — E538 Deficiency of other specified B group vitamins: Secondary | ICD-10-CM

## 2015-07-26 DIAGNOSIS — E1165 Type 2 diabetes mellitus with hyperglycemia: Secondary | ICD-10-CM | POA: Diagnosis not present

## 2015-07-26 DIAGNOSIS — E118 Type 2 diabetes mellitus with unspecified complications: Secondary | ICD-10-CM

## 2015-07-26 DIAGNOSIS — IMO0002 Reserved for concepts with insufficient information to code with codable children: Secondary | ICD-10-CM

## 2015-07-26 LAB — BASIC METABOLIC PANEL
BUN: 17 mg/dL (ref 6–23)
CALCIUM: 9.7 mg/dL (ref 8.4–10.5)
CO2: 30 mEq/L (ref 19–32)
CREATININE: 1.13 mg/dL (ref 0.40–1.50)
Chloride: 105 mEq/L (ref 96–112)
GFR: 81.1 mL/min (ref >60.00)
Glucose, Bld: 91 mg/dL (ref 70–99)
Potassium: 4 mEq/L (ref 3.5–5.1)
Sodium: 141 mEq/L (ref 135–145)

## 2015-07-26 LAB — POCT URINALYSIS DIPSTICK
BILIRUBIN UA: NEGATIVE
Glucose, UA: NEGATIVE
Ketones, UA: NEGATIVE
NITRITE UA: NEGATIVE
PH UA: 6
Spec Grav, UA: 1.025
UROBILINOGEN UA: 0.2

## 2015-07-26 LAB — CBC WITH DIFFERENTIAL/PLATELET
BASOS ABS: 0 10*3/uL (ref 0.0–0.1)
Basophils Relative: 0.5 % (ref 0.0–3.0)
Eosinophils Absolute: 0.2 10*3/uL (ref 0.0–0.7)
Eosinophils Relative: 4 % (ref 0.0–5.0)
HEMATOCRIT: 34.2 % — AB (ref 39.0–52.0)
HEMOGLOBIN: 11.1 g/dL — AB (ref 13.0–17.0)
LYMPHS ABS: 1.1 10*3/uL (ref 0.7–4.0)
Lymphocytes Relative: 18.8 % (ref 12.0–46.0)
MCHC: 32.6 g/dL (ref 30.0–36.0)
MCV: 86.8 fl (ref 78.0–100.0)
Monocytes Absolute: 0.4 10*3/uL (ref 0.1–1.0)
Monocytes Relative: 7.7 % (ref 3.0–12.0)
NEUTROS PCT: 69 % (ref 43.0–77.0)
Neutro Abs: 4 10*3/uL (ref 1.4–7.7)
Platelets: 335 10*3/uL (ref 150.0–400.0)
RBC: 3.94 Mil/uL — AB (ref 4.22–5.81)
RDW: 14.1 % (ref 11.5–15.5)
WBC: 5.8 10*3/uL (ref 4.0–10.5)

## 2015-07-26 LAB — HEPATIC FUNCTION PANEL
ALBUMIN: 4.3 g/dL (ref 3.5–5.2)
ALK PHOS: 108 U/L (ref 39–117)
ALT: 9 U/L (ref 0–53)
AST: 14 U/L (ref 0–37)
BILIRUBIN DIRECT: 0.1 mg/dL (ref 0.0–0.3)
TOTAL PROTEIN: 7.5 g/dL (ref 6.0–8.3)
Total Bilirubin: 0.4 mg/dL (ref 0.2–1.2)

## 2015-07-26 LAB — VITAMIN B12: VITAMIN B 12: 667 pg/mL (ref 211–911)

## 2015-07-26 LAB — TSH: TSH: 2.34 u[IU]/mL (ref 0.35–4.50)

## 2015-07-26 LAB — LIPID PANEL
CHOL/HDL RATIO: 2
Cholesterol: 116 mg/dL (ref 0–200)
HDL: 49.9 mg/dL (ref >39.00)
LDL Cholesterol: 60 mg/dL (ref 0–99)
NONHDL: 66.52
Triglycerides: 33 mg/dL (ref 0.0–149.0)
VLDL: 6.6 mg/dL (ref 0.0–40.0)

## 2015-07-26 LAB — HEMOGLOBIN A1C: Hgb A1c MFr Bld: 7.3 % — ABNORMAL HIGH (ref 4.6–6.5)

## 2015-07-26 LAB — PSA: PSA: 2.86 ng/mL (ref 0.10–4.00)

## 2015-07-26 NOTE — Progress Notes (Signed)
Pre visit review using our clinic review tool, if applicable. No additional management support is needed unless otherwise documented below in the visit note. 

## 2015-07-26 NOTE — Progress Notes (Signed)
Subjective:    Patient ID: Scott Vasquez, male    DOB: 05-Feb-1941, 74 y.o.   MRN: [id number]  HPI  Patient presents for yearly preventative medicine examination.  He had thyroidectomy in September of this year. Surgery was successful without complications. His only complaint is that of hoarseness but he endorses that this is getting better with time.   He has no other complaints today  All immunizations and health maintenance protocols were reviewed with the patient and needed orders were placed.  Appropriate screening laboratory values were ordered for the patient including screening of hyperlipidemia, renal function and hepatic function. If indicated by BPH, a PSA was ordered.  Medication reconciliation,  past medical history, social history, problem list and allergies were reviewed in detail with the patient  Goals were established with regard to weight loss, exercise, and  diet in compliance with medications.    Review of Systems  Constitutional: Negative.   HENT: Positive for voice change (Hoarsenss from thyroid surgery).   Eyes: Negative.   Respiratory: Negative.   Cardiovascular: Negative.   Gastrointestinal: Negative.   Endocrine: Negative.   Genitourinary: Negative.   Musculoskeletal: Negative.   Skin: Negative.        Scar across throat from thyroid surgery  Allergic/Immunologic: Negative.   Hematological: Negative.   Psychiatric/Behavioral: Negative.   All other systems reviewed and are negative.  Past Medical History  Diagnosis Date  . Diabetes mellitus     type II  . Hypertension   . Diverticulosis of colon   . Stroke ([place])     numbness in left hand  . Anemia     iron deficiency  . Thyroid nodule     Social History   Social History  . Marital Status: Married    Spouse Name: N/A  . Number of Children: N/A  . Years of Education: N/A   Occupational History  . retired Paediatric nurse   Social History Main Topics  . Smoking status: Former Smoker   Quit date: 08/10/1958  . Smokeless tobacco: Never Used  . Alcohol Use: No  . Drug Use: No  . Sexual Activity: Not on file   Other Topics Concern  . Not on file   Social History Narrative   Works at [employer] for 10 years   - Married with two children who live locally   - No pets   - Attend church for fun.       Past Surgical History  Procedure Laterality Date  . Cataract extraction, bilateral    . Back surgery    . Shoulder surgery  08/2007    Supple  . Thyroid lobectomy Left 04/29/2015  . Thyroid lobectomy Left 04/29/2015    Procedure: THYROID LOBECTOMY;  Surgeon: Armandina Gemma, MD;  Location: [facility] OR;  Service: General;  Laterality: Left;    Family History  Problem Relation Age of Onset  . Diabetes    . Diabetes Father     Died from diabetic coma in October 13, 1970  . Stroke Mother     Died in 36  . Hypertension Mother   . Hypertension Father     No Known Allergies  Current Outpatient Prescriptions on File Prior to Visit  Medication Sig Dispense Refill  . amLODipine (NORVASC) 5 MG tablet Take 1 tablet (5 mg total) by mouth daily. 90 tablet 1  . atorvastatin (LIPITOR) 40 MG tablet Take 1 tablet (40 mg total) by mouth daily. 90 tablet 1  . Blood Glucose Monitoring Suppl (ONE  TOUCH ULTRA SYSTEM KIT) W/DEVICE KIT Use to check blood sugar twice a day 1 each 0  . clopidogrel (PLAVIX) 75 MG tablet TAKE ONE TABLET BY MOUTH DAILY. 90 tablet 1  . cyanocobalamin 100 MCG tablet Take 100 mcg by mouth daily.    Marland Kitchen glipiZIDE (GLUCOTROL) 5 MG tablet Take 2 tablets by mouth twice daily. 120 tablet 5  . HUMULIN 70/30 (70-30) 100 UNIT/ML injection INJECT 10 UNITS SUBCUTANEOUSLY TWICE DAILY (BEFORE BREAKFAST AND DINNER) 10 mL 5  . HYDROcodone-acetaminophen (NORCO/VICODIN) 5-325 MG per tablet Take 1-2 tablets by mouth every 4 (four) hours as needed for moderate pain. 30 tablet 0  . Insulin Syringe-Needle U-100 (INSULIN SYRINGE 1CC/31GX5/16") 31G X 5/16" 1 ML MISC Use two times a day as directed.     Marland Kitchen  JANUVIA 100 MG tablet TAKE ONE TABLET BY MOUTH DAILY 90 tablet 1  . lisinopril (PRINIVIL,ZESTRIL) 40 MG tablet Take 1 tablet (40 mg total) by mouth daily. 90 tablet 1  . meloxicam (MOBIC) 7.5 MG tablet Take 7.6m by mouth once daily 30 tablet 2  . metFORMIN (GLUCOPHAGE) 500 MG tablet Take 2 tablets by mouth twice daily. 120 tablet 5  . ONE TOUCH ULTRA TEST test strip Check blood sugar twice daily. 100 each 5  . ONETOUCH DELICA LANCETS 376OMISC 1 each by Other route See admin instructions. Check blood sugar twice daily  0  . TRADJENTA 5 MG TABS tablet TAKE ONE TABLET BY MOUTH DAILY 30 tablet 6   No current facility-administered medications on file prior to visit.    BP 138/70 mmHg  Temp(Src) 98.2 F (36.8 C) (Oral)  Ht 5' 9"  (1.753 m)  Wt 197 lb (89.359 kg)  BMI 29.08 kg/m2       Objective:   Physical Exam  Constitutional: He is oriented to person, place, and time. He appears well-developed and well-nourished. No distress.  HENT:  Head: Normocephalic and atraumatic.  Right Ear: External ear normal.  Left Ear: External ear normal.  Nose: Nose normal.  Mouth/Throat: Oropharynx is clear and moist. No oropharyngeal exudate.  Eyes: Pupils are equal, round, and reactive to light. Right eye exhibits no discharge. Left eye exhibits no discharge. No scleral icterus.  Neck: Normal range of motion. Neck supple. No JVD present. No tracheal deviation present. No thyromegaly present.  Cardiovascular: Normal rate, regular rhythm, normal heart sounds and intact distal pulses.  Exam reveals no gallop and no friction rub.   No murmur heard. Pulmonary/Chest: Effort normal and breath sounds normal. No stridor. No respiratory distress. He has no wheezes. He has no rales. He exhibits no tenderness.  Abdominal: Soft. Bowel sounds are normal.  Genitourinary: Rectum normal, prostate normal and penis normal. Guaiac negative stool. No penile tenderness.  Musculoskeletal: Normal range of motion. He exhibits  no edema or tenderness.  Lymphadenopathy:    He has no cervical adenopathy.  Neurological: He is alert and oriented to person, place, and time. He has normal reflexes.  Skin: Skin is warm and dry. No rash noted. No erythema. No pallor.  Psychiatric: He has a normal mood and affect. His behavior is normal. Judgment and thought content normal.  Nursing note and vitals reviewed.      Assessment & Plan:  1. Routine general medical examination at a health care facility - Basic metabolic panel - CBC with Differential/Platelet - EKG 12-Lead - Hemoglobin A1c - Hepatic function panel - Lipid panel - POCT urinalysis dipstick - TSH - PSA - Continue to monitor  blood sugars at home and inform me if they are becoming elevated - Continue to eat healthy and exercise.  - Follow up in one year or sooner if needed.  2. Essential hypertension - Basic metabolic panel - CBC with Differential/Platelet - EKG 12-Lead - Hemoglobin A1c - Hepatic function panel - Lipid panel - POCT urinalysis dipstick - TSH - PSA  3. Uncontrolled type 2 diabetes mellitus with complication, without long-term current use of insulin (HCC) - Basic metabolic panel - CBC with Differential/Platelet - EKG 12-Lead - Hemoglobin A1c - Hepatic function panel - Lipid panel - POCT urinalysis dipstick - TSH - PSA  4. B12 deficiency  - Vitamin B12

## 2015-07-26 NOTE — Patient Instructions (Addendum)
It was great seeing you again!  I will follow up with you after i get your blood tests back.   Continue to monitor your blood sugars at home and inform me if they are getting high.     Health Maintenance, Male A healthy lifestyle and preventative care can promote health and wellness.  Maintain regular health, dental, and eye exams.  Eat a healthy diet. Foods like vegetables, fruits, whole grains, low-fat dairy products, and lean protein foods contain the nutrients you need and are low in calories. Decrease your intake of foods high in solid fats, added sugars, and salt. Get information about a proper diet from your health care provider, if necessary.  Regular physical exercise is one of the most important things you can do for your health. Most adults should get at least 150 minutes of moderate-intensity exercise (any activity that increases your heart rate and causes you to sweat) each week. In addition, most adults need muscle-strengthening exercises on 2 or more days a week.   Maintain a healthy weight. The body mass index (BMI) is a screening tool to identify possible weight problems. It provides an estimate of body fat based on height and weight. Your health care provider can find your BMI and can help you achieve or maintain a healthy weight. For males 20 years and older:  A BMI below 18.5 is considered underweight.  A BMI of 18.5 to 24.9 is normal.  A BMI of 25 to 29.9 is considered overweight.  A BMI of 30 and above is considered obese.  Maintain normal blood lipids and cholesterol by exercising and minimizing your intake of saturated fat. Eat a balanced diet with plenty of fruits and vegetables. Blood tests for lipids and cholesterol should begin at age 24 and be repeated every 5 years. If your lipid or cholesterol levels are high, you are over age 79, or you are at high risk for heart disease, you may need your cholesterol levels checked more frequently.Ongoing high lipid and  cholesterol levels should be treated with medicines if diet and exercise are not working.  If you smoke, find out from your health care provider how to quit. If you do not use tobacco, do not start.  Lung cancer screening is recommended for adults aged 64-80 years who are at high risk for developing lung cancer because of a history of smoking. A yearly low-dose CT scan of the lungs is recommended for people who have at least a 30-pack-year history of smoking and are current smokers or have quit within the past 15 years. A pack year of smoking is smoking an average of 1 pack of cigarettes a day for 1 year (for example, a 30-pack-year history of smoking could mean smoking 1 pack a day for 30 years or 2 packs a day for 15 years). Yearly screening should continue until the smoker has stopped smoking for at least 15 years. Yearly screening should be stopped for people who develop a health problem that would prevent them from having lung cancer treatment.  If you choose to drink alcohol, do not have more than 2 drinks per day. One drink is considered to be 12 oz (360 mL) of beer, 5 oz (150 mL) of wine, or 1.5 oz (45 mL) of liquor.  Avoid the use of street drugs. Do not share needles with anyone. Ask for help if you need support or instructions about stopping the use of drugs.  High blood pressure causes heart disease and increases the  risk of stroke. High blood pressure is more likely to develop in:  People who have blood pressure in the end of the normal range (100-139/85-89 mm Hg).  People who are overweight or obese.  People who are African American.  If you are 59-46 years of age, have your blood pressure checked every 3-5 years. If you are 71 years of age or older, have your blood pressure checked every year. You should have your blood pressure measured twice--once when you are at a hospital or clinic, and once when you are not at a hospital or clinic. Record the average of the two measurements. To  check your blood pressure when you are not at a hospital or clinic, you can use:  An automated blood pressure machine at a pharmacy.  A home blood pressure monitor.  If you are 57-11 years old, ask your health care provider if you should take aspirin to prevent heart disease.  Diabetes screening involves taking a blood sample to check your fasting blood sugar level. This should be done once every 3 years after age 31 if you are at a normal weight and without risk factors for diabetes. Testing should be considered at a younger age or be carried out more frequently if you are overweight and have at least 1 risk factor for diabetes.  Colorectal cancer can be detected and often prevented. Most routine colorectal cancer screening begins at the age of 30 and continues through age 70. However, your health care provider may recommend screening at an earlier age if you have risk factors for colon cancer. On a yearly basis, your health care provider may provide home test kits to check for hidden blood in the stool. A small camera at the end of a tube may be used to directly examine the colon (sigmoidoscopy or colonoscopy) to detect the earliest forms of colorectal cancer. Talk to your health care provider about this at age 76 when routine screening begins. A direct exam of the colon should be repeated every 5-10 years through age 43, unless early forms of precancerous polyps or small growths are found.  People who are at an increased risk for hepatitis B should be screened for this virus. You are considered at high risk for hepatitis B if:  You were born in a country where hepatitis B occurs often. Talk with your health care provider about which countries are considered high risk.  Your parents were born in a high-risk country and you have not received a shot to protect against hepatitis B (hepatitis B vaccine).  You have HIV or AIDS.  You use needles to inject street drugs.  You live with, or have sex  with, someone who has hepatitis B.  You are a man who has sex with other men (MSM).  You get hemodialysis treatment.  You take certain medicines for conditions like cancer, organ transplantation, and autoimmune conditions.  Hepatitis C blood testing is recommended for all people born from [birth year] through 1967 and any individual with known risk factors for hepatitis C.  Healthy men should no longer receive prostate-specific antigen (PSA) blood tests as part of routine cancer screening. Talk to your health care provider about prostate cancer screening.  Testicular cancer screening is not recommended for adolescents or adult males who have no symptoms. Screening includes self-exam, a health care provider exam, and other screening tests. Consult with your health care provider about any symptoms you have or any concerns you have about testicular cancer.  Practice safe  sex. Use condoms and avoid high-risk sexual practices to reduce the spread of sexually transmitted infections (STIs).  You should be screened for STIs, including gonorrhea and chlamydia if:  You are sexually active and are younger than 24 years.  You are older than 24 years, and your health care provider tells you that you are at risk for this type of infection.  Your sexual activity has changed since you were last screened, and you are at an increased risk for chlamydia or gonorrhea. Ask your health care provider if you are at risk.  If you are at risk of being infected with HIV, it is recommended that you take a prescription medicine daily to prevent HIV infection. This is called pre-exposure prophylaxis (PrEP). You are considered at risk if:  You are a man who has sex with other men (MSM).  You are a heterosexual man who is sexually active with multiple partners.  You take drugs by injection.  You are sexually active with a partner who has HIV.  Talk with your health care provider about whether you are at high risk of being  infected with HIV. If you choose to begin PrEP, you should first be tested for HIV. You should then be tested every 3 months for as long as you are taking PrEP.  Use sunscreen. Apply sunscreen liberally and repeatedly throughout the day. You should seek shade when your shadow is shorter than you. Protect yourself by wearing long sleeves, pants, a wide-brimmed hat, and sunglasses year round whenever you are outdoors.  Tell your health care provider of new moles or changes in moles, especially if there is a change in shape or color. Also, tell your health care provider if a mole is larger than the size of a pencil eraser.  A one-time screening for abdominal aortic aneurysm (AAA) and surgical repair of large AAAs by ultrasound is recommended for men aged 64-75 years who are current or former smokers.  Stay current with your vaccines (immunizations).   This information is not intended to replace advice given to you by your health care provider. Make sure you discuss any questions you have with your health care provider.   Document Released: 01/23/2008 Document Revised: 08/17/2014 Document Reviewed: 12/22/2010 Elsevier Interactive Patient Education [employer].

## 2015-07-31 ENCOUNTER — Other Ambulatory Visit: Payer: Self-pay | Admitting: Adult Health

## 2015-09-02 IMAGING — US US THYROID BIOPSY
1 series · 14 of 18 positions shown · non-contrast
Comparison: 04/04/2015

CLINICAL DATA: Dominant left thyroid palpable mass, previous biopsy
indeterminate, repeat biopsy for [place]

EXAM:
ULTRASOUND GUIDED NEEDLE ASPIRATE BIOPSY OF THE THYROID GLAND

[Series 1: us thyroid biopsy · 0.11mm/px · 18 acquisitions, 14 frames shown]
[im 1/18]
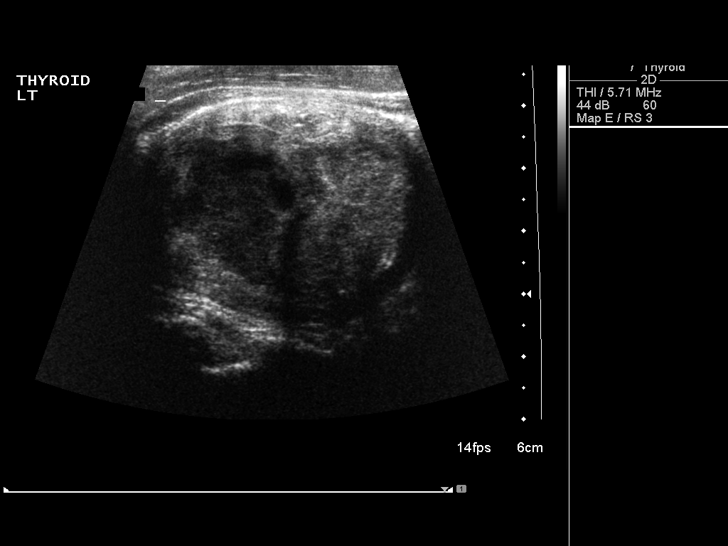
[im 2/18]
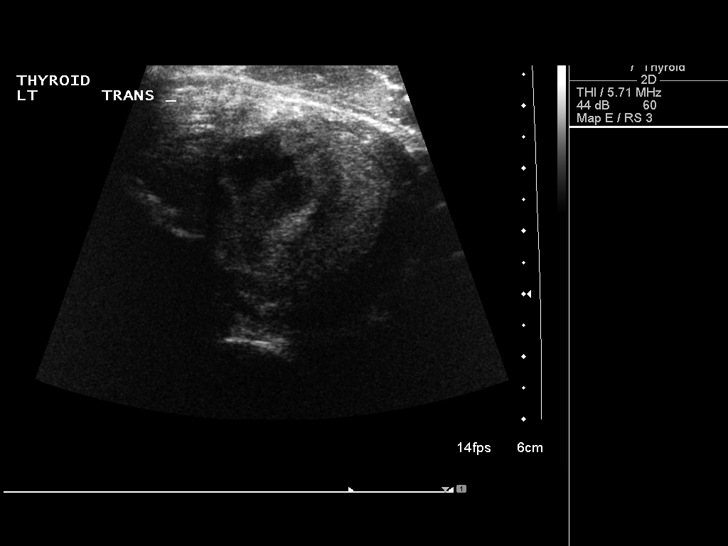
[im 4/18]
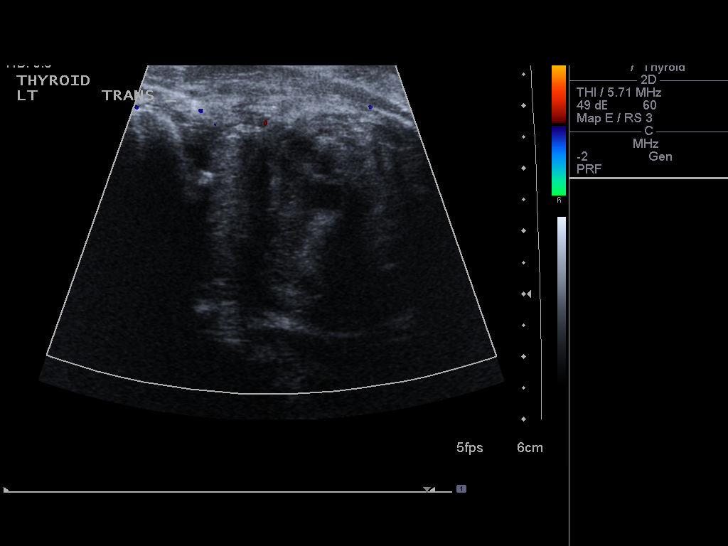
[im 5/18]
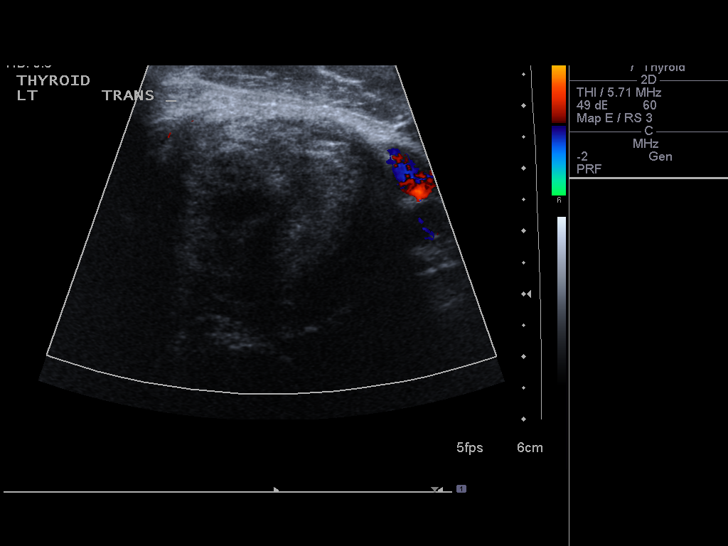
[im 6/18]
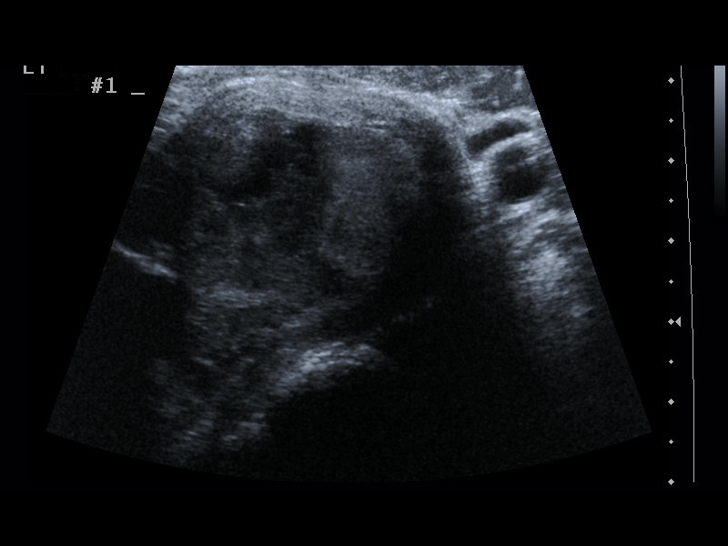
[im 8/18]
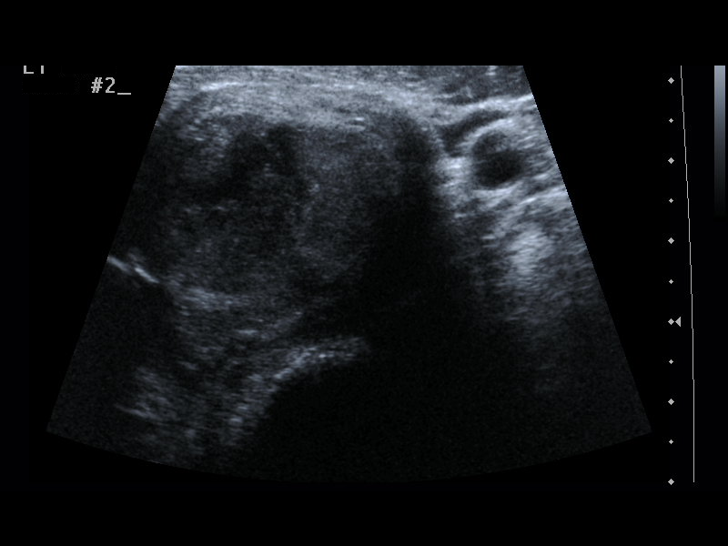
[im 9/18]
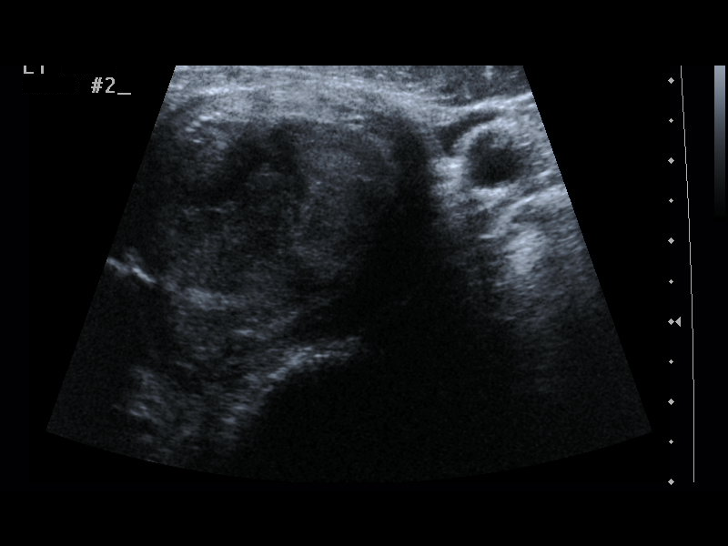
[im 10/18]
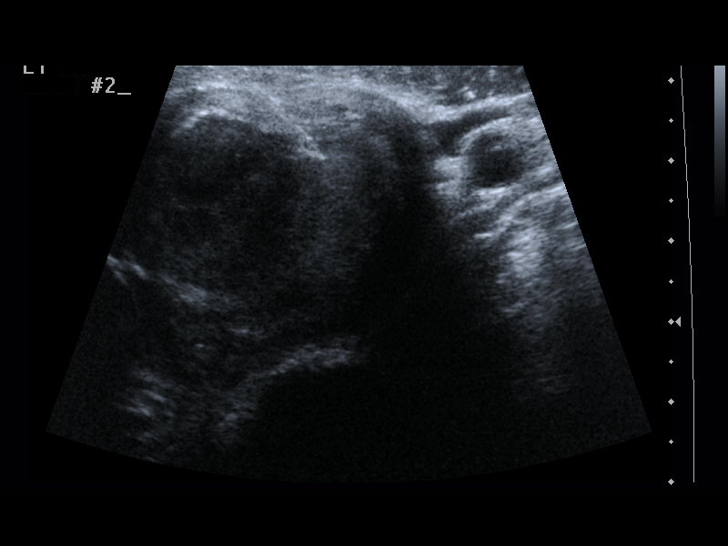
[im 11/18]
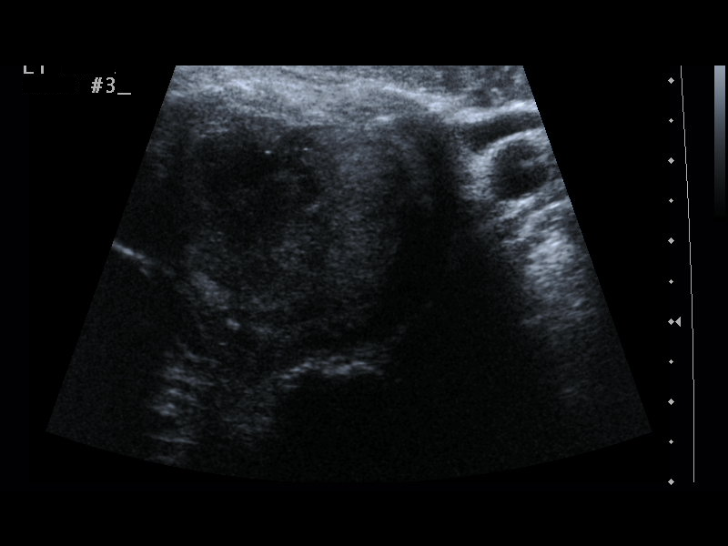
[im 13/18]
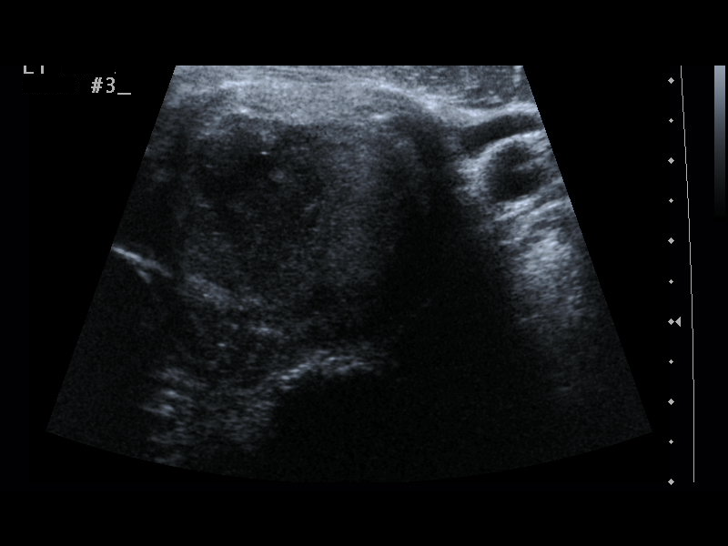
[im 14/18]
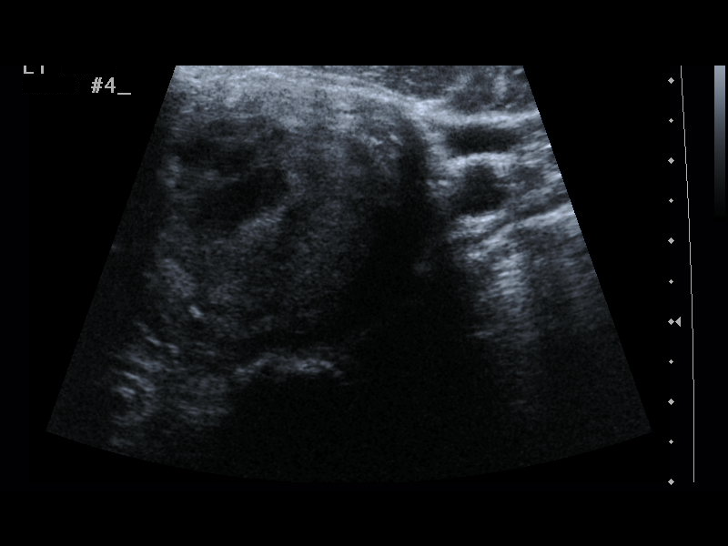
[im 15/18]
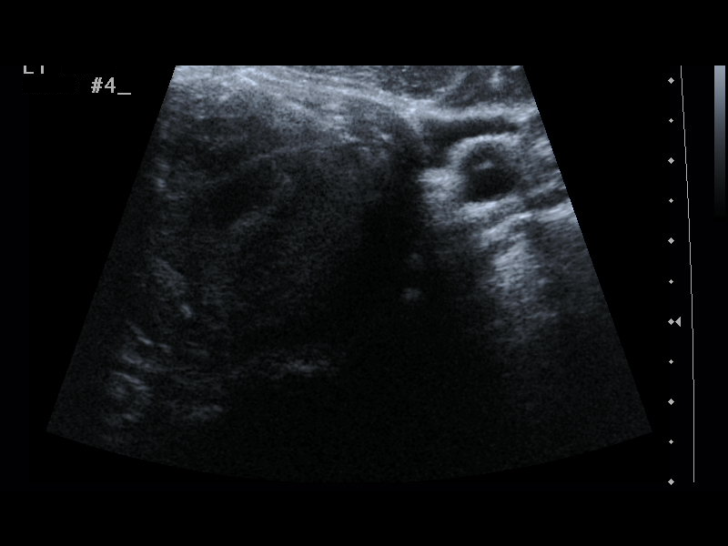
[im 17/18]
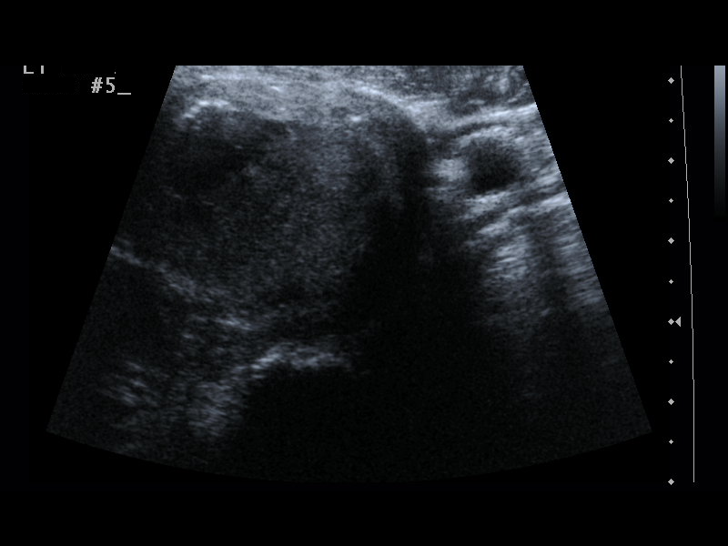
[im 18/18]
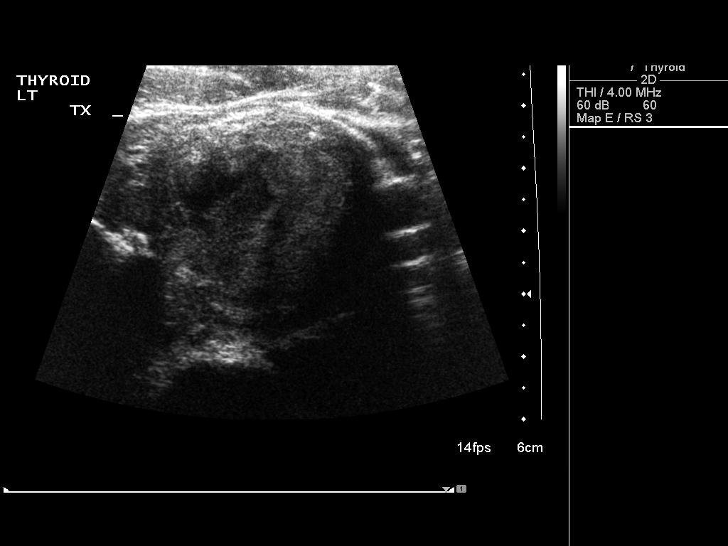

[14 of 18 positions shown; findings below may reference images not displayed]

PROCEDURE:
Thyroid biopsy was thoroughly discussed with the patient and
questions were answered. The benefits, risks, alternatives, and
complications were also discussed. The patient understands and
wishes to proceed with the procedure. Written consent was obtained.

Ultrasound was performed to localize and mark an adequate site for
the biopsy. The patient was then prepped and draped in a normal
sterile fashion. Local anesthesia was provided with 1% lidocaine.
Using direct ultrasound guidance, 5 passes were made using needles
into the nodule within the left Lobe of the thyroid. Ultrasound was
used to confirm needle placements on all occasions. Specimens were
sent to Pathology for analysis.

Complications:  None immediate
FINDINGS: Imaging confirms needle placement in the dominant left thyroid mass.
IMPRESSION: Ultrasound guided needle aspirate biopsy performed of the dominant
left thyroid nodule.

## 2015-09-09 ENCOUNTER — Other Ambulatory Visit: Payer: Self-pay | Admitting: Adult Health

## 2015-09-11 ENCOUNTER — Other Ambulatory Visit: Payer: Self-pay | Admitting: Adult Health

## 2015-10-04 ENCOUNTER — Ambulatory Visit (INDEPENDENT_AMBULATORY_CARE_PROVIDER_SITE_OTHER): Payer: BLUE CROSS/BLUE SHIELD | Admitting: Adult Health

## 2015-10-04 ENCOUNTER — Encounter: Payer: Self-pay | Admitting: Adult Health

## 2015-10-04 ENCOUNTER — Telehealth: Payer: Self-pay | Admitting: Adult Health

## 2015-10-04 VITALS — BP 148/70 | HR 86 | Temp 98.3°F | Wt 197.0 lb

## 2015-10-04 DIAGNOSIS — J3489 Other specified disorders of nose and nasal sinuses: Secondary | ICD-10-CM

## 2015-10-04 DIAGNOSIS — E079 Disorder of thyroid, unspecified: Secondary | ICD-10-CM | POA: Diagnosis not present

## 2015-10-04 LAB — TSH: TSH: 2.62 u[IU]/mL (ref 0.35–4.50)

## 2015-10-04 MED ORDER — LEVOCETIRIZINE DIHYDROCHLORIDE 5 MG PO TABS: 5.0000 mg | ORAL_TABLET | Freq: Every evening | ORAL | Status: DC

## 2015-10-04 NOTE — Telephone Encounter (Signed)
Pt stated that Scott Vasquez needs the name of the Thyroid pill he takes to update his chart.  The name of the medication is Levothyroxine 0.05 mg.

## 2015-10-04 NOTE — Patient Instructions (Addendum)
It was great seeing you again today   I have sent in a prescription for Xyzal 5 mg. Take this pill at night.   Also use Flonase (nasal spray)  twice a day.   Follow up with me if no improvement.   Hay Fever Hay fever is an allergic reaction to particles in the air. It cannot be passed from person to person. It cannot be cured, but it can be controlled. CAUSES  Hay fever is caused by something that triggers an allergic reaction (allergens). The following are examples of allergens:  Ragweed.  Feathers.  Animal dander.  Grass and tree pollens.  Cigarette smoke.  House dust.  Pollution. SYMPTOMS   Sneezing.  Runny or stuffy nose.  Tearing eyes.  Itchy eyes, nose, mouth, throat, skin, or other area.  Sore throat.  Headache.  Decreased sense of smell or taste. DIAGNOSIS Your caregiver will perform a physical exam and ask questions about the symptoms you are having.Allergy testing may be done to determine exactly what triggers your hay fever.  TREATMENT   Over-the-counter medicines may help symptoms. These include:  Antihistamines.  Decongestants. These may help with nasal congestion.  Your caregiver may prescribe medicines if over-the-counter medicines do not work.  Some people benefit from allergy shots when other medicines are not helpful. HOME CARE INSTRUCTIONS   Avoid the allergen that is causing your symptoms, if possible.  Take all medicine as told by your caregiver. SEEK MEDICAL CARE IF:   You have severe allergy symptoms and your current medicines are not helping.  Your treatment was working at one time, but you are now experiencing symptoms.  You have sinus congestion and pressure.  You develop a fever or headache.  You have thick nasal discharge.  You have asthma and have a worsening cough and wheezing. SEEK IMMEDIATE MEDICAL CARE IF:   You have swelling of your tongue or lips.  You have trouble breathing.  You feel lightheaded or  like you are going to faint.  You have cold sweats.  You have a fever.   This information is not intended to replace advice given to you by your health care provider. Make sure you discuss any questions you have with your health care provider.   Document Released: 07/27/2005 Document Revised: 10/19/2011 Document Reviewed: 02/06/2015 Elsevier Interactive Patient Education [employer].

## 2015-10-04 NOTE — Addendum Note (Signed)
Addended by: Apolinar Junes on: 10/04/2015 12:37 PM   Modules accepted: Medications, SmartSet

## 2015-10-04 NOTE — Progress Notes (Signed)
Subjective:    Patient ID: Scott Vasquez, male    DOB: 09-28-1940, 75 y.o.   MRN: [id number]  HPI  75 year old male who presents to the office today for a " continued cold" that he has had since " the end of last summer."   His symptoms include continual clearrhinorrhea. He denies any fevers, nausea, vomiting,diearrha.  He has been using Claritin, Zyrtec,Nyquil without relief.   He also complains of less energy since having his goiter removed. He states " it takes me a little longer to get up out of bed in the morning. "   Review of Systems  Constitutional: Positive for fatigue.  HENT: Positive for rhinorrhea. Negative for congestion, postnasal drip, sinus pressure, sneezing, sore throat and tinnitus.   Eyes: Negative.   Allergic/Immunologic: Negative.   Neurological: Negative.   All other systems reviewed and are negative.  Past Medical History  Diagnosis Date  . Diabetes mellitus     type II  . Hypertension   . Diverticulosis of colon   . 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 Anemia     iron deficiency  . Thyroid nodule     Social History   Social History  . Marital Status: Married    Spouse Name: N/A  . Number of Children: N/A  . Years of Education: N/A   Occupational History  . retired Paediatric nurse   Social History Main Topics  . Smoking status: Former Smoker    Quit date: 08/10/1958  . Smokeless tobacco: Never Used  . Alcohol Use: No  . Drug Use: No  . Sexual Activity: Not on file   Other Topics Concern  . Not on file   Social History Narrative   Works at [employer] for 10 years   - Married with two children who live locally   - No pets   - Attend church for fun.       Past Surgical History  Procedure Laterality Date  . Cataract extraction, bilateral    . Back surgery    . Shoulder surgery  08/2007    Supple  . Thyroid lobectomy Left 04/29/2015  . Thyroid lobectomy Left 04/29/2015    Procedure: THYROID LOBECTOMY;  Surgeon: Armandina Gemma, MD;   Location: [facility] OR;  Service: General;  Laterality: Left;    Family History  Problem Relation Age of Onset  . Diabetes    . Diabetes Father     Died from diabetic coma in September 26, 1970  . Stroke Mother     Died in 84  . Hypertension Mother   . Hypertension Father     No Known Allergies  Current Outpatient Prescriptions on File Prior to Visit  Medication Sig Dispense Refill  . amLODipine (NORVASC) 5 MG tablet TAKE ONE TABLET BY MOUTH DAILY. 90 tablet 2  . atorvastatin (LIPITOR) 40 MG tablet TAKE ONE TABLET BY MOUTH DAILY. 90 tablet 2  . Blood Glucose Monitoring Suppl (ONE TOUCH ULTRA SYSTEM KIT) W/DEVICE KIT Use to check blood sugar twice a day 1 each 0  . clopidogrel (PLAVIX) 75 MG tablet TAKE ONE TABLET BY MOUTH DAILY. 90 tablet 2  . cyanocobalamin 100 MCG tablet Take 100 mcg by mouth daily.    Marland Kitchen glipiZIDE (GLUCOTROL) 5 MG tablet Take 2 tablets by mouth twice daily. 120 tablet 5  . HUMULIN 70/30 (70-30) 100 UNIT/ML injection INJECT 10 UNITS SUBCUTANEOUSLY TWICE DAILY (BEFORE BREAKFAST AND DINNER) 10 mL 5  . HYDROcodone-acetaminophen (  NORCO/VICODIN) 5-325 MG per tablet Take 1-2 tablets by mouth every 4 (four) hours as needed for moderate pain. 30 tablet 0  . Insulin Syringe-Needle U-100 (INSULIN SYRINGE 1CC/31GX5/16") 31G X 5/16" 1 ML MISC Use two times a day as directed.     Marland Kitchen JANUVIA 100 MG tablet TAKE ONE TABLET BY MOUTH DAILY 90 tablet 1  . lisinopril (PRINIVIL,ZESTRIL) 40 MG tablet TAKE ONE TABLET BY MOUTH DAILY. 90 tablet 2  . meloxicam (MOBIC) 7.5 MG tablet TAKE ONE TABLET BY MOUTH ONCE DAILY. 30 tablet 3  . metFORMIN (GLUCOPHAGE) 500 MG tablet Take 2 tablets by mouth twice daily. 120 tablet 5  . ONE TOUCH ULTRA TEST test strip Check blood sugar twice daily. 100 each 5  . ONETOUCH DELICA LANCETS 50P MISC 1 each by Other route See admin instructions. Check blood sugar twice daily  0  . TRADJENTA 5 MG TABS tablet TAKE ONE TABLET BY MOUTH DAILY 30 tablet 6   No current  facility-administered medications on file prior to visit.    BP 148/70 mmHg  Pulse 86  Temp(Src) 98.3 F (36.8 C) (Oral)  Wt 197 lb (89.359 kg)       Objective:   Physical Exam  Constitutional: He is oriented to person, place, and time. He appears well-developed and well-nourished. No distress.  HENT:  Head: Normocephalic and atraumatic.  Right Ear: External ear normal.  Left Ear: External ear normal.  Mouth/Throat: Oropharynx is clear and moist. No oropharyngeal exudate.  Clear discharge from bilateral nare  Eyes: Conjunctivae and EOM are normal. Pupils are equal, round, and reactive to light. Right eye exhibits no discharge. Left eye exhibits no discharge.  Neck: Normal range of motion. Neck supple.  Cardiovascular: Normal rate, regular rhythm, normal heart sounds and intact distal pulses.  Exam reveals no gallop and no friction rub.   No murmur heard. Pulmonary/Chest: Effort normal and breath sounds normal. No respiratory distress. He has no wheezes. He has no rales. He exhibits no tenderness.  Lymphadenopathy:    He has no cervical adenopathy.  Neurological: He is alert and oriented to person, place, and time.  Skin: Skin is warm and dry. No rash noted. He is not diaphoretic. No erythema. No pallor.  Psychiatric: He has a normal mood and affect. His behavior is normal. Judgment and thought content normal.  Nursing note and vitals reviewed.     Assessment & Plan:  1. Thyroid mass - TSH  2. Rhinorrhea - Likely seasonal allergies.  Will trial Xyzal 5 mg as well as Flonase BID - levocetirizine (XYZAL) 5 MG tablet; Take 1 tablet (5 mg total) by mouth every evening.  Dispense: 30 tablet; Refill: 3 - Follow up if no improvement.

## 2015-10-04 NOTE — Progress Notes (Signed)
Pre visit review using our clinic review tool, if applicable. No additional management support is needed unless otherwise documented below in the visit note. 

## 2015-10-07 NOTE — Telephone Encounter (Signed)
Noted  

## 2015-10-17 ENCOUNTER — Other Ambulatory Visit: Payer: Self-pay | Admitting: Adult Health

## 2015-11-29 ENCOUNTER — Other Ambulatory Visit: Payer: Self-pay | Admitting: Adult Health

## 2016-01-29 ENCOUNTER — Other Ambulatory Visit: Payer: Self-pay | Admitting: Adult Health

## 2016-01-29 NOTE — Telephone Encounter (Signed)
Refill sent to pharmacy.   

## 2016-02-03 ENCOUNTER — Other Ambulatory Visit: Payer: Self-pay | Admitting: Adult Health

## 2016-02-03 NOTE — Telephone Encounter (Signed)
Refill sent to pharmacy.   

## 2016-02-17 ENCOUNTER — Other Ambulatory Visit: Payer: Self-pay | Admitting: Adult Health

## 2016-02-17 NOTE — Telephone Encounter (Signed)
Refill sent to pharmacy.   

## 2016-02-28 ENCOUNTER — Other Ambulatory Visit: Payer: Self-pay | Admitting: Adult Health

## 2016-02-28 NOTE — Telephone Encounter (Signed)
Ok to refill 

## 2016-02-28 NOTE — Telephone Encounter (Signed)
Ok to refill for 6 months 

## 2016-03-27 ENCOUNTER — Ambulatory Visit (INDEPENDENT_AMBULATORY_CARE_PROVIDER_SITE_OTHER): Payer: BLUE CROSS/BLUE SHIELD | Admitting: Adult Health

## 2016-03-27 ENCOUNTER — Encounter: Payer: Self-pay | Admitting: Adult Health

## 2016-03-27 VITALS — BP 150/70 | Temp 98.6°F | Ht 69.0 in | Wt 191.8 lb

## 2016-03-27 DIAGNOSIS — R4189 Other symptoms and signs involving cognitive functions and awareness: Secondary | ICD-10-CM

## 2016-03-27 DIAGNOSIS — E1165 Type 2 diabetes mellitus with hyperglycemia: Secondary | ICD-10-CM | POA: Diagnosis not present

## 2016-03-27 DIAGNOSIS — M25474 Effusion, right foot: Secondary | ICD-10-CM

## 2016-03-27 DIAGNOSIS — Z794 Long term (current) use of insulin: Secondary | ICD-10-CM | POA: Diagnosis not present

## 2016-03-27 DIAGNOSIS — E118 Type 2 diabetes mellitus with unspecified complications: Secondary | ICD-10-CM | POA: Diagnosis not present

## 2016-03-27 DIAGNOSIS — IMO0002 Reserved for concepts with insufficient information to code with codable children: Secondary | ICD-10-CM

## 2016-03-27 LAB — POCT GLYCOSYLATED HEMOGLOBIN (HGB A1C): Hemoglobin A1C: 7.2

## 2016-03-27 MED ORDER — METFORMIN HCL 1000 MG PO TABS: 1000.0000 mg | ORAL_TABLET | Freq: Two times a day (BID) | ORAL | 3 refills | Status: DC

## 2016-03-27 NOTE — Progress Notes (Signed)
Subjective:    Patient ID: Scott Vasquez, male    DOB: [birth year], 75 y.o.   MRN: [id number]  HPI  75 year old male who  has a past medical history of Anemia; Diabetes mellitus; Diverticulosis of colon; Hypertension; Stroke [facility]); and Thyroid nodule.  He presents today for the acute complaint of swelling in his right foot. He reports that " sometimes my right foot swells."  He has not figured out anything that proceeds the swelling. There is no redness, warmth or pain when his foot swells.   Today in the office there is no swelling to his right foot. He has no ulcers or wounds.   He does have toe nail fungus in the right great toe.   On a separate note, his wife is concerned that her husband has been becoming more confused as of lately. She reports that last week he got dressed to go to Sunday church on Friday. He is also forgetting where he placed items.   Review of Systems  Constitutional: Negative.   Respiratory: Negative.   Cardiovascular: Positive for leg swelling.  Musculoskeletal: Negative.   Skin: Negative.   All other systems reviewed and are negative.  Past Medical History:  Diagnosis Date  . Anemia    iron deficiency  . Diabetes mellitus    type II  . Diverticulosis of colon   . Hypertension   . Stroke ([place])    numbness in left hand  . Thyroid nodule     Social History   Social History  . Marital status: Married    Spouse name: N/A  . Number of children: N/A  . Years of education: N/A   Occupational History  . retired Paediatric nurse   Social History Main Topics  . Smoking status: Former Smoker    Quit date: 08/10/1958  . Smokeless tobacco: Never Used  . Alcohol use No  . Drug use: No  . Sexual activity: Not on file   Other Topics Concern  . Not on file   Social History Narrative   Works at [employer] for 10 years   - Married with two children who live locally   - No pets   - Attend church for fun.       Past Surgical History:  Procedure Laterality  Date  . BACK SURGERY    . CATARACT EXTRACTION, BILATERAL    . SHOULDER SURGERY  08/2007   Supple  . THYROID LOBECTOMY Left 04/29/2015  . THYROID LOBECTOMY Left 04/29/2015   Procedure: THYROID LOBECTOMY;  Surgeon: Scott Gemma, MD;  Location: [facility] OR;  Service: General;  Laterality: Left;    Family History  Problem Relation Age of Onset  . Diabetes    . Diabetes Father     Died from diabetic coma in Sep 24, 1970  . Stroke Mother     Died in 48  . Hypertension Mother   . Hypertension Father     No Known Allergies  Current Outpatient Prescriptions on File Prior to Visit  Medication Sig Dispense Refill  . amLODipine (NORVASC) 5 MG tablet TAKE ONE TABLET BY MOUTH DAILY. 90 tablet 2  . atorvastatin (LIPITOR) 40 MG tablet TAKE ONE TABLET BY MOUTH DAILY. 90 tablet 2  . Blood Glucose Monitoring Suppl (ONE TOUCH ULTRA SYSTEM KIT) W/DEVICE KIT Use to check blood sugar twice a day 1 each 0  . clopidogrel (PLAVIX) 75 MG tablet TAKE ONE TABLET BY MOUTH DAILY. 90 tablet 2  . cyanocobalamin 100 MCG tablet Take  100 mcg by mouth daily.    Marland Kitchen glipiZIDE (GLUCOTROL) 5 MG tablet Take 2 tablets by mouth twice daily. 120 tablet 5  . HUMULIN 70/30 (70-30) 100 UNIT/ML injection INJECT 10 UNITS SUBCUTANEOUSLY TWICE DAILY (BEFORE BREAKFAST AND DINNER) 10 mL 5  . HYDROcodone-acetaminophen (NORCO/VICODIN) 5-325 MG per tablet Take 1-2 tablets by mouth every 4 (four) hours as needed for moderate pain. 30 tablet 0  . Insulin Syringe-Needle U-100 (INSULIN SYRINGE 1CC/31GX5/16") 31G X 5/16" 1 ML MISC Use two times a day as directed.     Marland Kitchen levocetirizine (XYZAL) 5 MG tablet Take 1 tablet (5 mg total) by mouth every evening. 30 tablet 3  . levothyroxine (SYNTHROID, LEVOTHROID) 50 MCG tablet Take 50 mcg by mouth daily.  6  . lisinopril (PRINIVIL,ZESTRIL) 40 MG tablet TAKE ONE TABLET BY MOUTH DAILY. 90 tablet 2  . meloxicam (MOBIC) 7.5 MG tablet TAKE ONE TABLET BY MOUTH ONCE DAILY. 30 tablet 5  . ONE TOUCH ULTRA TEST test strip  Check blood sugar twice daily. 100 each 5  . ONETOUCH DELICA LANCETS 27M MISC 1 each by Other route See admin instructions. Check blood sugar twice daily  0  . TRADJENTA 5 MG TABS tablet TAKE ONE TABLET BY MOUTH DAILY 30 tablet 4   No current facility-administered medications on file prior to visit.     BP (!) 150/70   Temp 98.6 F (37 C) (Oral)   Ht _0  (1.753 m)   Wt 191 lb 12.8 oz (87 kg)   BMI 28.32 kg/m       Objective:   Physical Exam  Constitutional: He is oriented to person, place, and time. He appears well-developed and well-nourished. No distress.  HENT:  Head: Normocephalic and atraumatic.  Right Ear: External ear normal.  Left Ear: External ear normal.  Nose: Nose normal.  Mouth/Throat: Oropharynx is clear and moist. No oropharyngeal exudate.  Cardiovascular: Normal rate, regular rhythm, normal heart sounds and intact distal pulses.  Exam reveals no gallop.   No murmur heard. Pulmonary/Chest: Effort normal and breath sounds normal. No respiratory distress. He has no wheezes. He has no rales. He exhibits no tenderness.  Musculoskeletal: Normal range of motion. He exhibits no edema, tenderness or deformity.  No edema, redness, warmth, pain or trauma noted to right foot  Neurological: He is alert and oriented to person, place, and time.  Mild forgetfulness noted on exam today   Skin: Skin is warm and dry. No rash noted. He is not diaphoretic. No erythema. No pallor.  Psychiatric: He has a normal mood and affect. His behavior is normal. Thought content normal.  Nursing note and vitals reviewed.     Assessment & Plan:  1. Swelling of foot joint, right - No swelling noted. Advised to return if symptoms manifest again  2. Uncontrolled type 2 diabetes mellitus with complication, with long-term current use of insulin (HCC) - POC A1c - 7.2  - metFORMIN (GLUCOPHAGE) 1000 MG tablet; Take 1 tablet (1,000 mg total) by mouth 2 (two) times daily with a meal.  Dispense: 180  tablet; Refill: 3  3. Cognitive impairment - Family is going to bring patient back next week for full workup.  - Advised to follow up sooner if symptoms worsen.   [name], NP

## 2016-03-27 NOTE — Patient Instructions (Addendum)
It was great seeing you to today   I would like to see you back next Friday for a follow up appointment   I have gone up on your Metformin to 1000mg  twice a day. Your A1c was 7.2 today, that is pretty

## 2016-04-03 ENCOUNTER — Encounter: Payer: Self-pay | Admitting: Adult Health

## 2016-04-03 ENCOUNTER — Ambulatory Visit (INDEPENDENT_AMBULATORY_CARE_PROVIDER_SITE_OTHER): Payer: BLUE CROSS/BLUE SHIELD | Admitting: Adult Health

## 2016-04-03 VITALS — BP 132/64 | Temp 98.1°F | Ht 69.0 in | Wt 194.0 lb

## 2016-04-03 DIAGNOSIS — R419 Unspecified symptoms and signs involving cognitive functions and awareness: Secondary | ICD-10-CM | POA: Diagnosis not present

## 2016-04-03 DIAGNOSIS — R4189 Other symptoms and signs involving cognitive functions and awareness: Secondary | ICD-10-CM

## 2016-04-03 LAB — CBC WITH DIFFERENTIAL/PLATELET
BASOS ABS: 0 10*3/uL (ref 0.0–0.1)
Basophils Relative: 0.3 % (ref 0.0–3.0)
Eosinophils Absolute: 0.2 10*3/uL (ref 0.0–0.7)
Eosinophils Relative: 3.1 % (ref 0.0–5.0)
HCT: 33.4 % — ABNORMAL LOW (ref 39.0–52.0)
Hemoglobin: 11 g/dL — ABNORMAL LOW (ref 13.0–17.0)
LYMPHS ABS: 1.6 10*3/uL (ref 0.7–4.0)
Lymphocytes Relative: 25.7 % (ref 12.0–46.0)
MCHC: 33 g/dL (ref 30.0–36.0)
MCV: 85.8 fl (ref 78.0–100.0)
MONO ABS: 0.6 10*3/uL (ref 0.1–1.0)
MONOS PCT: 9.5 % (ref 3.0–12.0)
NEUTROS ABS: 3.9 10*3/uL (ref 1.4–7.7)
Neutrophils Relative %: 61.4 % (ref 43.0–77.0)
PLATELETS: 278 10*3/uL (ref 150.0–400.0)
RBC: 3.9 Mil/uL — AB (ref 4.22–5.81)
RDW: 13.8 % (ref 11.5–15.5)
WBC: 6.4 10*3/uL (ref 4.0–10.5)

## 2016-04-03 LAB — COMPREHENSIVE METABOLIC PANEL
ALT: 7 U/L (ref 0–53)
AST: 10 U/L (ref 0–37)
Albumin: 4.3 g/dL (ref 3.5–5.2)
Alkaline Phosphatase: 107 U/L (ref 39–117)
BUN: 25 mg/dL — AB (ref 6–23)
CO2: 28 meq/L (ref 19–32)
Calcium: 9.3 mg/dL (ref 8.4–10.5)
Chloride: 106 mEq/L (ref 96–112)
Creatinine, Ser: 1.26 mg/dL (ref 0.40–1.50)
GFR: 71.39 mL/min (ref >60.00)
Glucose, Bld: 75 mg/dL (ref 70–99)
POTASSIUM: 4.9 meq/L (ref 3.5–5.1)
SODIUM: 140 meq/L (ref 135–145)
Total Bilirubin: 0.4 mg/dL (ref 0.2–1.2)
Total Protein: 7.4 g/dL (ref 6.0–8.3)

## 2016-04-03 LAB — TSH: TSH: 3.02 u[IU]/mL (ref 0.35–4.50)

## 2016-04-03 LAB — VITAMIN B12: VITAMIN B 12: 396 pg/mL (ref 211–911)

## 2016-04-03 MED ORDER — DONEPEZIL HCL 5 MG PO TABS: 5.0000 mg | ORAL_TABLET | Freq: Every day | ORAL | 1 refills | Status: DC

## 2016-04-03 NOTE — Progress Notes (Signed)
Subjective:    Patient ID: Scott Vasquez, male    DOB: Sep 29, 1940, 75 y.o.   MRN: [id number]  HPI   75 year old male, who  has a past medical history of Anemia; Diabetes mellitus; Diverticulosis of colon; Hypertension; Stroke [facility]); and Thyroid nodule. He presents today with his wife for the evaluation of cognitive impairment. His wife is concerned that her husband has been becoming more confused as of lately. She reports that last week he got dressed to go to Sunday church on Friday. He is also forgetting where he placed items around the house such a keys.   He has come alone to this appointment today. He does endorse forgetting where he placed items around the house, but states " I did that when I was 17." He denies getting lost while driving or forgetting what day it is.   He chalks up his memory issues to " getting older."   He denies any depression or sleep issues  Review of Systems  Constitutional: Negative.   HENT: Negative.   Respiratory: Negative.   Cardiovascular: Negative.   Genitourinary: Negative.   Musculoskeletal: Negative.   Neurological: Negative.   Hematological: Negative.   All other systems reviewed and are negative.  Past Medical History:  Diagnosis Date  . Anemia    iron deficiency  . Diabetes mellitus    type II  . Diverticulosis of colon   . Hypertension   . Stroke ([place])    numbness in left hand  . Thyroid nodule     Social History   Social History  . Marital status: Married    Spouse name: N/A  . Number of children: N/A  . Years of education: N/A   Occupational History  . retired Paediatric nurse   Social History Main Topics  . Smoking status: Former Smoker    Quit date: 08/10/1958  . Smokeless tobacco: Never Used  . Alcohol use No  . Drug use: No  . Sexual activity: Not on file   Other Topics Concern  . Not on file   Social History Narrative   Works at [employer] for 10 years   - Married with two children who live locally   - No pets   -  Attend church for fun.       Past Surgical History:  Procedure Laterality Date  . BACK SURGERY    . CATARACT EXTRACTION, BILATERAL    . SHOULDER SURGERY  08/2007   Supple  . THYROID LOBECTOMY Left 04/29/2015  . THYROID LOBECTOMY Left 04/29/2015   Procedure: THYROID LOBECTOMY;  Surgeon: Armandina Gemma, MD;  Location: [facility] OR;  Service: General;  Laterality: Left;    Family History  Problem Relation Age of Onset  . Diabetes    . Diabetes Father     Died from diabetic coma in 28-Sep-1970  . Stroke Mother     Died in 59  . Hypertension Mother   . Hypertension Father     No Known Allergies  Current Outpatient Prescriptions on File Prior to Visit  Medication Sig Dispense Refill  . amLODipine (NORVASC) 5 MG tablet TAKE ONE TABLET BY MOUTH DAILY. 90 tablet 2  . atorvastatin (LIPITOR) 40 MG tablet TAKE ONE TABLET BY MOUTH DAILY. 90 tablet 2  . Blood Glucose Monitoring Suppl (ONE TOUCH ULTRA SYSTEM KIT) W/DEVICE KIT Use to check blood sugar twice a day 1 each 0  . clopidogrel (PLAVIX) 75 MG tablet TAKE ONE TABLET BY MOUTH DAILY. 90 tablet  2  . cyanocobalamin 100 MCG tablet Take 100 mcg by mouth daily.    Marland Kitchen glipiZIDE (GLUCOTROL) 5 MG tablet Take 2 tablets by mouth twice daily. 120 tablet 5  . HUMULIN 70/30 (70-30) 100 UNIT/ML injection INJECT 10 UNITS SUBCUTANEOUSLY TWICE DAILY (BEFORE BREAKFAST AND DINNER) 10 mL 5  . HYDROcodone-acetaminophen (NORCO/VICODIN) 5-325 MG per tablet Take 1-2 tablets by mouth every 4 (four) hours as needed for moderate pain. 30 tablet 0  . Insulin Syringe-Needle U-100 (INSULIN SYRINGE 1CC/31GX5/16") 31G X 5/16" 1 ML MISC Use two times a day as directed.     Marland Kitchen levocetirizine (XYZAL) 5 MG tablet Take 1 tablet (5 mg total) by mouth every evening. 30 tablet 3  . levothyroxine (SYNTHROID, LEVOTHROID) 50 MCG tablet Take 50 mcg by mouth daily.  6  . lisinopril (PRINIVIL,ZESTRIL) 40 MG tablet TAKE ONE TABLET BY MOUTH DAILY. 90 tablet 2  . meloxicam (MOBIC) 7.5 MG tablet TAKE  ONE TABLET BY MOUTH ONCE DAILY. 30 tablet 5  . metFORMIN (GLUCOPHAGE) 1000 MG tablet Take 1 tablet (1,000 mg total) by mouth 2 (two) times daily with a meal. 180 tablet 3  . ONE TOUCH ULTRA TEST test strip Check blood sugar twice daily. 100 each 5  . ONETOUCH DELICA LANCETS 22L MISC 1 each by Other route See admin instructions. Check blood sugar twice daily  0  . TRADJENTA 5 MG TABS tablet TAKE ONE TABLET BY MOUTH DAILY 30 tablet 4   No current facility-administered medications on file prior to visit.     BP 132/64   Temp 98.1 F (36.7 C) (Oral)   Ht '5\' 9"'$  (1.753 m)   Wt 194 lb (88 kg)   BMI 28.65 kg/m       Objective:   Physical Exam  Constitutional: He is oriented to person, place, and time. He appears well-developed and well-nourished. No distress.  HENT:  Head: Normocephalic and atraumatic.  Right Ear: External ear normal.  Left Ear: External ear normal.  Nose: Nose normal.  Mouth/Throat: Oropharynx is clear and moist. No oropharyngeal exudate.  Eyes: Conjunctivae and EOM are normal. Pupils are equal, round, and reactive to light. Right eye exhibits no discharge. Left eye exhibits no discharge. No scleral icterus.  Neck: Normal range of motion. Neck supple. No thyromegaly present.  Cardiovascular: Normal rate, regular rhythm, normal heart sounds and intact distal pulses.  Exam reveals no gallop and no friction rub.   No murmur heard. Pulmonary/Chest: Effort normal and breath sounds normal. No respiratory distress. He has no wheezes. He has no rales.  Musculoskeletal: Normal range of motion. He exhibits no edema, tenderness or deformity.  Lymphadenopathy:    He has no cervical adenopathy.  Neurological: He is alert and oriented to person, place, and time.  Skin: Skin is warm and dry. No rash noted. He is not diaphoretic. No erythema. No pallor.  Psychiatric: He has a normal mood and affect. His behavior is normal. Judgment and thought content normal.  Nursing note and vitals  reviewed.     Assessment & Plan:  1. Cognitive impairment Mini Mental Score of 22. He misses the following areas Orientation to time - Today's date - Year Attention - Missed three of the letter in spelling the word [place] backwards Delayed Verbal Recall - Missed all three   - Vitamin B12 - CBC with Differential/Platelet - CMP - TSH  - He does not want to see Neurology at this point for more formal testing.  - He is  ok with starting Aricept 63m and following up in 3 months - I will update his wife with the plan   CDorothyann Peng NP

## 2016-04-03 NOTE — Patient Instructions (Signed)
It was great seeing you.   As we discussed, the exam showed mild cognitive impairment. I am going to start you on a medication called Aricept, this will help with the memory issues.   I will follow up with you regarding your blood work   Please follow up with me in 3 months

## 2016-05-29 ENCOUNTER — Ambulatory Visit (INDEPENDENT_AMBULATORY_CARE_PROVIDER_SITE_OTHER): Payer: BLUE CROSS/BLUE SHIELD

## 2016-05-29 DIAGNOSIS — Z23 Encounter for immunization: Secondary | ICD-10-CM | POA: Diagnosis not present

## 2016-06-02 ENCOUNTER — Other Ambulatory Visit: Payer: Self-pay | Admitting: Adult Health

## 2016-06-02 DIAGNOSIS — J3489 Other specified disorders of nose and nasal sinuses: Secondary | ICD-10-CM

## 2016-06-09 ENCOUNTER — Other Ambulatory Visit: Payer: Self-pay | Admitting: Adult Health

## 2016-06-29 ENCOUNTER — Other Ambulatory Visit: Payer: Self-pay | Admitting: Adult Health

## 2016-07-10 ENCOUNTER — Ambulatory Visit (INDEPENDENT_AMBULATORY_CARE_PROVIDER_SITE_OTHER): Payer: BLUE CROSS/BLUE SHIELD | Admitting: Adult Health

## 2016-07-10 ENCOUNTER — Encounter: Payer: Self-pay | Admitting: Adult Health

## 2016-07-10 VITALS — BP 160/80 | Temp 97.9°F | Ht 69.0 in | Wt 204.2 lb

## 2016-07-10 DIAGNOSIS — Z794 Long term (current) use of insulin: Secondary | ICD-10-CM

## 2016-07-10 DIAGNOSIS — R4189 Other symptoms and signs involving cognitive functions and awareness: Secondary | ICD-10-CM

## 2016-07-10 DIAGNOSIS — E118 Type 2 diabetes mellitus with unspecified complications: Secondary | ICD-10-CM | POA: Diagnosis not present

## 2016-07-10 DIAGNOSIS — IMO0002 Reserved for concepts with insufficient information to code with codable children: Secondary | ICD-10-CM

## 2016-07-10 DIAGNOSIS — E1165 Type 2 diabetes mellitus with hyperglycemia: Secondary | ICD-10-CM | POA: Diagnosis not present

## 2016-07-10 DIAGNOSIS — I10 Essential (primary) hypertension: Secondary | ICD-10-CM | POA: Diagnosis not present

## 2016-07-10 LAB — POCT GLYCOSYLATED HEMOGLOBIN (HGB A1C): Hemoglobin A1C: 8.7

## 2016-07-10 MED ORDER — DONEPEZIL HCL 10 MG PO TABS: 10.0000 mg | ORAL_TABLET | Freq: Every day | ORAL | 1 refills | Status: DC

## 2016-07-10 NOTE — Patient Instructions (Signed)
It was great seeing you today  I have increased the Aricept to 10 mg at bedtime. You can take two pills of what you have at home and the new prescription will be at the pharmacy.   Your A1c is up to 8.7 today. Please work on diet   I will see you on 12/22 for the physical

## 2016-07-10 NOTE — Progress Notes (Signed)
Subjective:    Patient ID: Scott Vasquez, male    DOB: [birth year], 75 y.o.   MRN: [id number]  HPI  75 year old male who presents to the office today for 3 month follow up after starting Aricept 24m and for diabetes . He reports that he does not feel like this medication has made any improvement in his memory. He continues to forget things around the house. He continues to deny getting lose while driving or forgetting which day it is.   He feels as though the aricept has made him gain weight.   He is checking his blood sugars twice a day and reports " they are fair."   His blood pressures have been in the 120/80's at home  BP Readings from Last 3 Encounters:  07/10/16 (!) 160/80  04/03/16 132/64  03/27/16 (!) 150/70     Wt Readings from Last 3 Encounters:  07/10/16 204 lb 3.2 oz (92.6 kg)  04/03/16 194 lb (88 kg)  03/27/16 191 lb 12.8 oz (87 kg)     Review of Systems  Constitutional: Negative.   Respiratory: Negative.   Cardiovascular: Negative.   Psychiatric/Behavioral: Positive for confusion. Negative for agitation, self-injury and sleep disturbance.  All other systems reviewed and are negative.  Past Medical History:  Diagnosis Date  . Anemia    iron deficiency  . Diabetes mellitus    type II  . Diverticulosis of colon   . Hypertension   . [place]    numbness in left hand  . Thyroid nodule     Social History   Social History  . Marital status: Married    Spouse name: N/A  . Number of children: N/A  . Years of education: N/A   Occupational History  . retired [employer] nurse  Social History Main Topics  . Smoking status: Former Smoker    Quit date: 08/10/1958  . Smokeless tobacco: Never Used  . Alcohol use No  . Drug use: No  . Sexual activity: Not on file   Other Topics Concern  . Not on file   Social History Narrative   Works at [employer] years   - Married with two children who live locally   - No pets   - Attend church for fun.        Past Surgical History:  Procedure Laterality Date  . BACK SURGERY    . CATARACT EXTRACTION, BILATERAL    . SHOULDER SURGERY  08/2007   Supple  . THYROID LOBECTOMY Left 04/29/2015  . THYROID LOBECTOMY Left 04/29/2015   Procedure: THYROID LOBECTOMY;  Surgeon: TArmandina Gemma MD;  [facility];  Service: General;  Laterality: Left;    Family History  Problem Relation Age of Onset  . Diabetes    . Diabetes Father     Died from diabetic coma in 11972-03-20 . Stroke Mother     Died in 160 . Hypertension Mother   . Hypertension Father     No Known Allergies  Current Outpatient Prescriptions on File Prior to Visit  Medication Sig Dispense Refill  . amLODipine (NORVASC) 5 MG tablet TAKE ONE TABLET BY MOUTH DAILY. 90 tablet 1  . atorvastatin (LIPITOR) 40 MG tablet TAKE ONE TABLET BY MOUTH DAILY. 90 tablet 1  . Blood Glucose Monitoring Suppl (ONE TOUCH ULTRA SYSTEM KIT) W/DEVICE KIT Use to check blood sugar twice a day 1 each 0  . clopidogrel (PLAVIX) 75 MG tablet TAKE ONE TABLET BY  MOUTH DAILY. 90 tablet 2  . cyanocobalamin 100 MCG tablet Take 100 mcg by mouth daily.    Marland Kitchen glipiZIDE (GLUCOTROL) 5 MG tablet Take 2 tablets by mouth twice daily. 120 tablet 5  . HUMULIN 70/30 (70-30) 100 UNIT/ML injection INJECT 10 UNITS SUBCUTANEOUSLY TWICE DAILY (BEFORE BREAKFAST AND DINNER) 10 mL 5  . HYDROcodone-acetaminophen (NORCO/VICODIN) 5-325 MG per tablet Take 1-2 tablets by mouth every 4 (four) hours as needed for moderate pain. 30 tablet 0  . Insulin Syringe-Needle U-100 (INSULIN SYRINGE 1CC/31GX5/16") 31G X 5/16" 1 ML MISC Use two times a day as directed.     Marland Kitchen levocetirizine (XYZAL) 5 MG tablet TAKE ONE TABLET BY MOUTH EVERY EVENING. 30 tablet 5  . levothyroxine (SYNTHROID, LEVOTHROID) 50 MCG tablet Take 50 mcg by mouth daily.  6  . lisinopril (PRINIVIL,ZESTRIL) 40 MG tablet TAKE ONE TABLET BY MOUTH DAILY. 90 tablet 1  . meloxicam (MOBIC) 7.5 MG tablet TAKE ONE TABLET BY MOUTH ONCE DAILY. 30  tablet 5  . metFORMIN (GLUCOPHAGE) 1000 MG tablet Take 1 tablet (1,000 mg total) by mouth 2 (two) times daily with a meal. 180 tablet 3  . metFORMIN (GLUCOPHAGE) 500 MG tablet TAKE TWO (2) TABLETS BY MOUTH TWICE DAILY. 360 tablet 1  . ONE TOUCH ULTRA TEST test strip Check blood sugar twice daily. 100 each 5  . ONETOUCH DELICA LANCETS 84C MISC 1 each by Other route See admin instructions. Check blood sugar twice daily  0  . TRADJENTA 5 MG TABS tablet TAKE ONE TABLET BY MOUTH DAILY 30 tablet 4   No current facility-administered medications on file prior to visit.     BP (!) 160/80   Temp 97.9 F (36.6 C) (Oral)   Ht 5' 9"  (1.753 m)   Wt 204 lb 3.2 oz (92.6 kg)   BMI 30.16 kg/m       Objective:   Physical Exam  Constitutional: He is oriented to person, place, and time. He appears well-developed and well-nourished. No distress.  Cardiovascular: Normal rate, regular rhythm, normal heart sounds and intact distal pulses.  Exam reveals no friction rub.   No murmur heard. Pulmonary/Chest: Effort normal and breath sounds normal. No respiratory distress. He has no wheezes. He has no rales. He exhibits no tenderness.  Neurological: He is alert and oriented to person, place, and time.  Skin: Skin is warm and dry. No rash noted. He is not diaphoretic. No erythema.  Psychiatric: He has a normal mood and affect. His behavior is normal. Judgment and thought content normal.  Nursing note and vitals reviewed.     Assessment & Plan:  1. Cognitive impairment - Will increase to 10 mg - donepezil (ARICEPT) 10 MG tablet; Take 1 tablet (10 mg total) by mouth at bedtime.  Dispense: 30 tablet; Refill: 1 - Follow up at physical this month  2. Uncontrolled type 2 diabetes mellitus with complication, with long-term current use of insulin (HCC) - POC HgB A1c - 8.7. He is up from 7.1 three months ago.  - Work on reducing carbs and do portion control.  - Will recheck at physical   3. Essential  hypertension - Not at goal  - Bring blood pressure cuff to physical  - Monitor at home and let me know if it is consistently above 150/90  [name], NP

## 2016-07-23 ENCOUNTER — Other Ambulatory Visit: Payer: Self-pay | Admitting: Adult Health

## 2016-07-24 ENCOUNTER — Ambulatory Visit (INDEPENDENT_AMBULATORY_CARE_PROVIDER_SITE_OTHER): Payer: BLUE CROSS/BLUE SHIELD | Admitting: Adult Health

## 2016-07-24 VITALS — BP 152/70 | Temp 97.7°F | Ht 69.0 in | Wt 206.9 lb

## 2016-07-24 DIAGNOSIS — R350 Frequency of micturition: Secondary | ICD-10-CM | POA: Diagnosis not present

## 2016-07-24 LAB — POCT URINALYSIS DIPSTICK
BILIRUBIN UA: NEGATIVE
GLUCOSE UA: NEGATIVE
Ketones, UA: NEGATIVE
NITRITE UA: NEGATIVE
RBC UA: NEGATIVE
Spec Grav, UA: 1.025
Urobilinogen, UA: NEGATIVE
pH, UA: 6

## 2016-07-24 MED ORDER — TAMSULOSIN HCL 0.4 MG PO CAPS: 0.4000 mg | ORAL_CAPSULE | Freq: Every day | ORAL | 3 refills | Status: DC

## 2016-07-24 NOTE — Progress Notes (Signed)
 Subjective:    Patient ID: Scott Vasquez, male    DOB: 04/09/1941, 75 y.o.   MRN: [id number]  HPI  75 year old male who presents to the office today for urinary frequency/urgency . He reports that for the last 2-3 weeks he has had issues with urgency and frequency throughout the day and night. He also reports that has stream has decreased. He is having to get up to go the restroom more than 3-4 times per night   He denies any hematuria or dysuria.    Review of Systems  Constitutional: Negative.   Respiratory: Negative.   Cardiovascular: Negative.   Genitourinary: Positive for decreased urine volume, frequency and urgency. Negative for dysuria, flank pain, hematuria, penile pain, penile swelling, scrotal swelling and testicular pain.  All other systems reviewed and are negative.  Past Medical History:  Diagnosis Date  . Anemia    iron deficiency  . Diabetes mellitus    type II  . Diverticulosis of colon   . Hypertension   . Stroke (HCC)    numbness in left hand  . Thyroid nodule     Social History   Social History  . Marital status: Married    Spouse name: N/A  . Number of children: N/A  . Years of education: N/A   Occupational History  . retired [employer]   Social History Main Topics  . Smoking status: Former Smoker    Quit date: 08/10/1958  . Smokeless tobacco: Never Used  . Alcohol use No  . Drug use: No  . Sexual activity: Not on file   Other Topics Concern  . Not on file   Social History Narrative   Works at [employer] for 10 years   - Married with two children who live locally   - No pets   - Attend church for fun.       Past Surgical History:  Procedure Laterality Date  . BACK SURGERY    . CATARACT EXTRACTION, BILATERAL    . SHOULDER SURGERY  08/2007   Supple  . THYROID LOBECTOMY Left 04/29/2015  . THYROID LOBECTOMY Left 04/29/2015   Procedure: THYROID LOBECTOMY;  Surgeon: Todd Gerkin, MD;  Location: [facility];  Service: General;  Laterality: Left;     Family History  Problem Relation Age of Onset  . Diabetes    . Diabetes Father     Died from diabetic coma in 1972  . Stroke Mother     Died in 1972  . Hypertension Mother   . Hypertension Father     No Known Allergies  Current Outpatient Prescriptions on File Prior to Visit  Medication Sig Dispense Refill  . amLODipine (NORVASC) 5 MG tablet TAKE ONE TABLET BY MOUTH DAILY. 90 tablet 1  . atorvastatin (LIPITOR) 40 MG tablet TAKE ONE TABLET BY MOUTH DAILY. 90 tablet 1  . Blood Glucose Monitoring Suppl (ONE TOUCH ULTRA SYSTEM KIT) W/DEVICE KIT Use to check blood sugar twice a day 1 each 0  . clopidogrel (PLAVIX) 75 MG tablet TAKE ONE TABLET BY MOUTH DAILY. 90 tablet 2  . cyanocobalamin 100 MCG tablet Take 100 mcg by mouth daily.    . donepezil (ARICEPT) 10 MG tablet Take 1 tablet (10 mg total) by mouth at bedtime. 30 tablet 1  . glipiZIDE (GLUCOTROL) 5 MG tablet Take 2 tablets by mouth twice daily. 120 tablet 5  . HUMULIN 70/30 (70-30) 100 UNIT/ML injection INJECT 10 UNITS SUBCUTANEOUSLY TWICE DAILY (BEFORE BREAKFAST AND DINNER) 10   mL 1  . HYDROcodone-acetaminophen (NORCO/VICODIN) 5-325 MG per tablet Take 1-2 tablets by mouth every 4 (four) hours as needed for moderate pain. 30 tablet 0  . Insulin Syringe-Needle U-100 (INSULIN SYRINGE 1CC/31GX5/16") 31G X 5/16" 1 ML MISC Use two times a day as directed.     Marland Kitchen levocetirizine (XYZAL) 5 MG tablet TAKE ONE TABLET BY MOUTH EVERY EVENING. 30 tablet 5  . levothyroxine (SYNTHROID, LEVOTHROID) 50 MCG tablet Take 50 mcg by mouth daily.  6  . lisinopril (PRINIVIL,ZESTRIL) 40 MG tablet TAKE ONE TABLET BY MOUTH DAILY. 90 tablet 1  . meloxicam (MOBIC) 7.5 MG tablet TAKE ONE TABLET BY MOUTH ONCE DAILY. 30 tablet 5  . metFORMIN (GLUCOPHAGE) 1000 MG tablet Take 1 tablet (1,000 mg total) by mouth 2 (two) times daily with a meal. 180 tablet 3  . metFORMIN (GLUCOPHAGE) 500 MG tablet TAKE TWO (2) TABLETS BY MOUTH TWICE DAILY. 360 tablet 1  . ONE TOUCH  ULTRA TEST test strip Check blood sugar twice daily. 100 each 5  . ONETOUCH DELICA LANCETS 58I MISC 1 each by Other route See admin instructions. Check blood sugar twice daily  0  . TRADJENTA 5 MG TABS tablet TAKE ONE TABLET BY MOUTH DAILY 30 tablet 4   No current facility-administered medications on file prior to visit.     BP (!) 152/70   Temp 97.7 F (36.5 C) (Oral)   Ht 5' 9" (1.753 m)   Wt 206 lb 14.4 oz (93.8 kg)   BMI 30.55 kg/m       Objective:   Physical Exam  Constitutional: He is oriented to person, place, and time. He appears well-developed and well-nourished. No distress.  Cardiovascular: Normal rate, regular rhythm, normal heart sounds and intact distal pulses.  Exam reveals no friction rub.   No murmur heard. Pulmonary/Chest: Effort normal and breath sounds normal. No respiratory distress. He has no wheezes. He has no rales. He exhibits no tenderness.  Abdominal: Bowel sounds are normal. He exhibits no distension and no mass. There is no tenderness. There is no rebound and no guarding.  Neurological: He is alert and oriented to person, place, and time.  Skin: Skin is warm and dry. No rash noted. He is not diaphoretic. No erythema. No pallor.  Psychiatric: He has a normal mood and affect. His behavior is normal. Judgment and thought content normal.  Nursing note and vitals reviewed.     Assessment & Plan:  1. Urinary frequency - Appears as LUTS with obstruction. Will start on Flomax 0.4 mg daily  - POCT urinalysis dipstick- + leuks. Will send culture - Urine culture - tamsulosin (FLOMAX) 0.4 MG CAPS capsule; Take 1 capsule (0.4 mg total) by mouth daily.  Dispense: 30 capsule; Refill: 3 - Follow up with in one week at his physical   [name], NP

## 2016-07-26 LAB — URINE CULTURE: ORGANISM ID, BACTERIA: NO GROWTH

## 2016-07-28 ENCOUNTER — Other Ambulatory Visit: Payer: Self-pay | Admitting: Adult Health

## 2016-07-28 NOTE — Telephone Encounter (Signed)
Ok to refill? Patient has CPE scheduled 07/31/16

## 2016-07-28 NOTE — Telephone Encounter (Signed)
Ok to refill Plavix for one year. Mobic for one month

## 2016-07-30 ENCOUNTER — Telehealth: Payer: Self-pay

## 2016-07-30 NOTE — Telephone Encounter (Signed)
Call to Ms. Stockham States spouse can come tomorrow for AWV at 7:45 and then see Cory at 8:30 am

## 2016-07-31 ENCOUNTER — Encounter: Payer: Self-pay | Admitting: Adult Health

## 2016-07-31 ENCOUNTER — Ambulatory Visit (INDEPENDENT_AMBULATORY_CARE_PROVIDER_SITE_OTHER): Payer: BLUE CROSS/BLUE SHIELD | Admitting: Adult Health

## 2016-07-31 VITALS — BP 154/60 | HR 82 | Ht 70.5 in | Wt 208.0 lb

## 2016-07-31 DIAGNOSIS — Z0000 Encounter for general adult medical examination without abnormal findings: Secondary | ICD-10-CM

## 2016-07-31 DIAGNOSIS — E1165 Type 2 diabetes mellitus with hyperglycemia: Secondary | ICD-10-CM

## 2016-07-31 DIAGNOSIS — Z794 Long term (current) use of insulin: Secondary | ICD-10-CM

## 2016-07-31 DIAGNOSIS — E785 Hyperlipidemia, unspecified: Secondary | ICD-10-CM | POA: Diagnosis not present

## 2016-07-31 DIAGNOSIS — E118 Type 2 diabetes mellitus with unspecified complications: Secondary | ICD-10-CM

## 2016-07-31 DIAGNOSIS — I10 Essential (primary) hypertension: Secondary | ICD-10-CM

## 2016-07-31 DIAGNOSIS — R4189 Other symptoms and signs involving cognitive functions and awareness: Secondary | ICD-10-CM

## 2016-07-31 DIAGNOSIS — IMO0002 Reserved for concepts with insufficient information to code with codable children: Secondary | ICD-10-CM

## 2016-07-31 LAB — BASIC METABOLIC PANEL
BUN: 22 mg/dL (ref 6–23)
CALCIUM: 9.1 mg/dL (ref 8.4–10.5)
CO2: 28 meq/L (ref 19–32)
CREATININE: 1.31 mg/dL (ref 0.40–1.50)
Chloride: 105 mEq/L (ref 96–112)
GFR: 68.2 mL/min (ref >60.00)
GLUCOSE: 126 mg/dL — AB (ref 70–99)
Potassium: 4.2 mEq/L (ref 3.5–5.1)
Sodium: 140 mEq/L (ref 135–145)

## 2016-07-31 LAB — HEPATIC FUNCTION PANEL
ALT: 11 U/L (ref 0–53)
AST: 15 U/L (ref 0–37)
Albumin: 4.2 g/dL (ref 3.5–5.2)
Alkaline Phosphatase: 128 U/L — ABNORMAL HIGH (ref 39–117)
BILIRUBIN DIRECT: 0.1 mg/dL (ref 0.0–0.3)
BILIRUBIN TOTAL: 0.4 mg/dL (ref 0.2–1.2)
Total Protein: 7.1 g/dL (ref 6.0–8.3)

## 2016-07-31 LAB — TSH: TSH: 3.24 u[IU]/mL (ref 0.35–4.50)

## 2016-07-31 LAB — CBC WITH DIFFERENTIAL/PLATELET
BASOS ABS: 0 10*3/uL (ref 0.0–0.1)
Basophils Relative: 0.3 % (ref 0.0–3.0)
EOS ABS: 0.2 10*3/uL (ref 0.0–0.7)
Eosinophils Relative: 2.5 % (ref 0.0–5.0)
HCT: 33.4 % — ABNORMAL LOW (ref 39.0–52.0)
Hemoglobin: 11 g/dL — ABNORMAL LOW (ref 13.0–17.0)
LYMPHS ABS: 1.2 10*3/uL (ref 0.7–4.0)
Lymphocytes Relative: 18.7 % (ref 12.0–46.0)
MCHC: 32.8 g/dL (ref 30.0–36.0)
MCV: 85.8 fl (ref 78.0–100.0)
Monocytes Absolute: 0.5 10*3/uL (ref 0.1–1.0)
Monocytes Relative: 7.1 % (ref 3.0–12.0)
NEUTROS ABS: 4.7 10*3/uL (ref 1.4–7.7)
NEUTROS PCT: 71.4 % (ref 43.0–77.0)
PLATELETS: 304 10*3/uL (ref 150.0–400.0)
RBC: 3.9 Mil/uL — ABNORMAL LOW (ref 4.22–5.81)
RDW: 13.7 % (ref 11.5–15.5)
WBC: 6.6 10*3/uL (ref 4.0–10.5)

## 2016-07-31 LAB — LIPID PANEL
CHOL/HDL RATIO: 2
Cholesterol: 126 mg/dL (ref 0–200)
HDL: 53.1 mg/dL (ref >39.00)
LDL CALC: 61 mg/dL (ref 0–99)
NonHDL: 73.09
TRIGLYCERIDES: 59 mg/dL (ref 0.0–149.0)
VLDL: 11.8 mg/dL (ref 0.0–40.0)

## 2016-07-31 LAB — HEMOGLOBIN A1C: Hgb A1c MFr Bld: 8.9 % — ABNORMAL HIGH (ref 4.6–6.5)

## 2016-07-31 LAB — PSA: PSA: 2.47 ng/mL (ref 0.10–4.00)

## 2016-07-31 MED ORDER — AMLODIPINE BESYLATE 10 MG PO TABS: 10.0000 mg | ORAL_TABLET | Freq: Every day | ORAL | 3 refills | Status: DC

## 2016-07-31 NOTE — Progress Notes (Signed)
Subjective:    Patient ID: Scott Vasquez, male    DOB: [birth year], 75 y.o.   MRN: [id number]  HPI  Patient presents for yearly preventative medicine examination. He is a pleasant 75 year old male who  has a past medical history of Anemia; Diabetes mellitus; Diverticulosis of colon; Hypertension; Stroke [facility]); and Thyroid nodule.  All immunizations and health maintenance protocols were reviewed with the patient and needed orders were placed.  Appropriate screening laboratory values were ordered for the patient including screening of hyperlipidemia, renal function and hepatic function. If indicated by BPH, a PSA was ordered.  Medication reconciliation,  past medical history, social history, problem list and allergies were reviewed in detail with the patient  Goals were established with regard to weight loss, exercise, and  diet in compliance with medications.  End of life planning was discussed.  Hypertension - he takes lisinopril 40 mg and Amlodipine 5 mg, he reports that his blood pressure has been " a little high lately" He reports blood pressure readings in the 150's at home.   Cognitive impairment - We have gone up on Aricept from 5 mg to 10 mg. He does not feel as though this has made much difference.   DM - not well controlled on his current medications.  Lab Results  Component Value Date   HGBA1C 8.7 07/10/2016   He reports checking his blood sugars at home and they have been in the 120's.   LUTS - He was started on Flomax two weeks ago and he has noticed considerable difference in the nocturia.   Review of Systems  Constitutional: Negative.   HENT: Negative.   Eyes: Negative.   Respiratory: Negative.   Cardiovascular: Negative.   Gastrointestinal: Negative.   Endocrine: Negative.   Genitourinary: Negative.   Musculoskeletal: Negative.   Skin: Negative.   Allergic/Immunologic: Negative.   Neurological: Negative.   Hematological: Negative.   Psychiatric/Behavioral:  Negative.   All other systems reviewed and are negative.  Past Medical History:  Diagnosis Date  . Anemia    iron deficiency  . Diabetes mellitus    type II  . Diverticulosis of colon   . Hypertension   . Stroke ([place])    numbness in left hand  . Thyroid nodule     Social History   Social History  . Marital status: Married    Spouse name: N/A  . Number of children: N/A  . Years of education: N/A   Occupational History  . retired Paediatric nurse   Social History Main Topics  . Smoking status: Former Smoker    Quit date: 08/10/1958  . Smokeless tobacco: Never Used  . Alcohol use No  . Drug use: No  . Sexual activity: Not on file   Other Topics Concern  . Not on file   Social History Narrative   Works at [employer] for 10 years/ works 5 days ; 40 hour week    - Married with two children who live locally   - No pets   - Attend church for fun.       Past Surgical History:  Procedure Laterality Date  . BACK SURGERY    . CATARACT EXTRACTION, BILATERAL    . SHOULDER SURGERY  08/2007   Supple  . THYROID LOBECTOMY Left 04/29/2015  . THYROID LOBECTOMY Left 04/29/2015   Procedure: THYROID LOBECTOMY;  Surgeon: Armandina Gemma, MD;  Location: [place];  Service: General;  Laterality: Left;    Family History  Problem  Relation Age of Onset  . Diabetes    . Diabetes Father     Died from diabetic coma in 1970/10/06  . Hypertension Father   . Stroke Mother     Died in 6  . Hypertension Mother     No Known Allergies  Current Outpatient Prescriptions on File Prior to Visit  Medication Sig Dispense Refill  . amLODipine (NORVASC) 5 MG tablet TAKE ONE TABLET BY MOUTH DAILY. 90 tablet 1  . atorvastatin (LIPITOR) 40 MG tablet TAKE ONE TABLET BY MOUTH DAILY. 90 tablet 1  . Blood Glucose Monitoring Suppl (ONE TOUCH ULTRA SYSTEM KIT) W/DEVICE KIT Use to check blood sugar twice a day 1 each 0  . clopidogrel (PLAVIX) 75 MG tablet TAKE ONE TABLET BY MOUTH DAILY. 90 tablet 3  . cyanocobalamin 100  MCG tablet Take 100 mcg by mouth daily.    Marland Kitchen donepezil (ARICEPT) 10 MG tablet Take 1 tablet (10 mg total) by mouth at bedtime. 30 tablet 1  . glipiZIDE (GLUCOTROL) 5 MG tablet Take 2 tablets by mouth twice daily. 120 tablet 5  . HUMULIN 70/30 (70-30) 100 UNIT/ML injection INJECT 10 UNITS SUBCUTANEOUSLY TWICE DAILY (BEFORE BREAKFAST AND DINNER) 10 mL 1  . Insulin Syringe-Needle U-100 (INSULIN SYRINGE 1CC/31GX5/16") 31G X 5/16" 1 ML MISC Use two times a day as directed.     Marland Kitchen levocetirizine (XYZAL) 5 MG tablet TAKE ONE TABLET BY MOUTH EVERY EVENING. 30 tablet 5  . levothyroxine (SYNTHROID, LEVOTHROID) 50 MCG tablet Take 50 mcg by mouth daily.  6  . lisinopril (PRINIVIL,ZESTRIL) 40 MG tablet TAKE ONE TABLET BY MOUTH DAILY. 90 tablet 1  . meloxicam (MOBIC) 7.5 MG tablet TAKE ONE TABLET BY MOUTH ONCE DAILY. 30 tablet 0  . metFORMIN (GLUCOPHAGE) 1000 MG tablet Take 1 tablet (1,000 mg total) by mouth 2 (two) times daily with a meal. 180 tablet 3  . metFORMIN (GLUCOPHAGE) 500 MG tablet TAKE TWO (2) TABLETS BY MOUTH TWICE DAILY. 360 tablet 1  . ONE TOUCH ULTRA TEST test strip Check blood sugar twice daily. 100 each 5  . ONETOUCH DELICA LANCETS 67E MISC 1 each by Other route See admin instructions. Check blood sugar twice daily  0  . tamsulosin (FLOMAX) 0.4 MG CAPS capsule Take 1 capsule (0.4 mg total) by mouth daily. 30 capsule 3  . TRADJENTA 5 MG TABS tablet TAKE ONE TABLET BY MOUTH DAILY 30 tablet 4  . HYDROcodone-acetaminophen (NORCO/VICODIN) 5-325 MG per tablet Take 1-2 tablets by mouth every 4 (four) hours as needed for moderate pain. (Patient not taking: Reported on 07/31/2016) 30 tablet 0   No current facility-administered medications on file prior to visit.     BP (!) 154/60   Pulse 82   Ht 5' 10.5" (1.791 m)   Wt 208 lb (94.3 kg)   SpO2 97%   BMI 29.42 kg/m       Objective:   Physical Exam  Constitutional: He is oriented to person, place, and time. He appears well-developed and  well-nourished. No distress.  HENT:  Head: Normocephalic and atraumatic.  Right Ear: External ear normal.  Left Ear: External ear normal.  Nose: Nose normal.  Mouth/Throat: Oropharynx is clear and moist. No oropharyngeal exudate.  Eyes: Conjunctivae and EOM are normal. Pupils are equal, round, and reactive to light. Right eye exhibits no discharge. Left eye exhibits no discharge. No scleral icterus.  Neck: Trachea normal and normal range of motion. Neck supple. No JVD present. Carotid bruit is not  present. No tracheal deviation present. No thyroid mass and no thyromegaly present.  Cardiovascular: Normal rate, regular rhythm, normal heart sounds and intact distal pulses.  Exam reveals no gallop and no friction rub.   No murmur heard. Pulmonary/Chest: Effort normal and breath sounds normal. No stridor. No respiratory distress. He has no wheezes. He has no rales. He exhibits no tenderness.  Abdominal: Soft. Bowel sounds are normal. He exhibits no distension and no mass. There is no tenderness. There is no rebound and no guarding.  Genitourinary: Rectum normal. Rectal exam shows guaiac negative stool. Prostate is enlarged. Prostate is not tender.  Musculoskeletal: Normal range of motion. He exhibits no tenderness.  Lymphadenopathy:    He has no cervical adenopathy.  Neurological: He is alert and oriented to person, place, and time. He displays normal reflexes. No cranial nerve deficit. He exhibits normal muscle tone. Coordination normal.  Skin: Skin is warm and dry. No rash noted. He is not diaphoretic. No erythema. No pallor.  Psychiatric: He has a normal mood and affect. His behavior is normal. Judgment and thought content normal.  Nursing note and vitals reviewed.     Assessment & Plan:  1. Routine general medical examination at a health care facility - Basic metabolic panel - CBC with Differential/Platelet - Hemoglobin A1c - Hepatic function panel - Lipid panel - POCT Urinalysis  Dipstick (Automated) - PSA - TSH  2. Essential hypertension - Not at goal. Will increase Norvasc from 5 mg to 10 mg.  - Continue to monitor BP at home and follow up in 3 weeks  - amLODipine (NORVASC) 10 MG tablet; Take 1 tablet (10 mg total) by mouth daily.  Dispense: 90 tablet; Refill: 3 - Basic metabolic panel - CBC with Differential/Platelet - Hemoglobin A1c - Hepatic function panel - Lipid panel - POCT Urinalysis Dipstick (Automated) - PSA - TSH  3. Uncontrolled type 2 diabetes mellitus with complication, with long-term current use of insulin ([place]) - Not well controlled.  - Advised diabetic diet and frequent exercise  - Basic metabolic panel - CBC with Differential/Platelet - Hemoglobin A1c - Hepatic function panel - Lipid panel - POCT Urinalysis Dipstick (Automated) - PSA - TSH - Consider adding an additional agent  - Follow up in three months  4. Cognitive impairment - Mini Mental status exam done today during [facility]. His results show 21/30  - Ambulatory referral to Neurology  5. Hyperlipidemia, unspecified hyperlipidemia type - Consider increasing statin  - amLODipine (NORVASC) 10 MG tablet; Take 1 tablet (10 mg total) by mouth daily.  Dispense: 90 tablet; Refill: 3 - Basic metabolic panel - CBC with Differential/Platelet - Hemoglobin A1c - Hepatic function panel - Lipid panel - POCT Urinalysis Dipstick (Automated) - PSA - TSH  [name], NP

## 2016-07-31 NOTE — Patient Instructions (Addendum)
Scott Vasquez , Thank you for taking time to come for your Medicare Wellness Visit. I appreciate your ongoing commitment to your health goals. Please review the following plan we discussed and let me know if I can assist you in the future.   [name] will call Dr. Katy Fitch and request eye exam  Will postpone shingles as you are not a candidate if you have not had chicken pox  These are the goals we discussed: Goals    . patient          Keep working and stay active       This is a list of the screening recommended for you and due dates:  Health Maintenance  Topic Date Due  . Complete foot exam   11/26/2015  . Eye exam for diabetics  12/28/2015  . Shingles Vaccine  07/30/2017*  . Hemoglobin A1C  01/08/2017  . Tetanus Vaccine  11/25/2024  . Flu Shot  Completed  . Pneumonia vaccines  Completed  *Topic was postponed. The date shown is not the original due date.   Prevention of falls: Remove rugs or any tripping hazards in the home Use Non slip mats in bathtubs and showers Placing grab bars next to the toilet and or shower Placing handrails on both sides of the stair way Adding extra lighting in the home.   Personal safety issues reviewed:  1. Consider starting a community watch program per [facility] 2.  Changes batteries is smoke detector and/or carbon monoxide detector  3.  If you have firearms; keep them in a safe place 4.  Wear protection when in the sun; Always wear sunscreen or a hat; It is good to have your doctor check your skin annually or review any new areas of concern 5. Driving safety; Keep in the right lane; stay 3 car lengths behind the car in front of you on the highway; look 3 times prior to pulling out; carry your cell phone everywhere you go!   Learn about the Yellow Dot program:  The program allows first responders at your emergency to have access to who your physician is, as well as your medications and medical conditions.  Citizens requesting the  Yellow Dot Packages should contact Master Corporal Nunzio Cobbs at the [facility] [phone] for the first week of the program and beginning the week after Easter citizens should contact their Scientist, physiological.       Fall Prevention in the Home Introduction Falls can cause injuries. They can happen to people of all ages. There are many things you can do to make your home safe and to help prevent falls. What can I do on the outside of my home?  Regularly fix the edges of walkways and driveways and fix any cracks.  Remove anything that might make you trip as you walk through a door, such as a raised step or threshold.  Trim any bushes or trees on the path to your home.  Use bright outdoor lighting.  Clear any walking paths of anything that might make someone trip, such as rocks or tools.  Regularly check to see if handrails are loose or broken. Make sure that both sides of any steps have handrails.  Any raised decks and porches should have guardrails on the edges.  Have any leaves, snow, or ice cleared regularly.  Use sand or salt on walking paths during winter.  Clean up any spills in your garage right away. This includes oil or  grease spills. What can I do in the bathroom?  Use night lights.  Install grab bars by the toilet and in the tub and shower. Do not use towel bars as grab bars.  Use non-skid mats or decals in the tub or shower.  If you need to sit down in the shower, use a plastic, non-slip stool.  Keep the floor dry. Clean up any water that spills on the floor as soon as it happens.  Remove soap buildup in the tub or shower regularly.  Attach bath mats securely with double-sided non-slip rug tape.  Do not have throw rugs and other things on the floor that can make you trip. What can I do in the bedroom?  Use night lights.  Make sure that you have a light by your bed that is easy to reach.  Do not use any sheets or  blankets that are too big for your bed. They should not hang down onto the floor.  Have a firm chair that has side arms. You can use this for support while you get dressed.  Do not have throw rugs and other things on the floor that can make you trip. What can I do in the kitchen?  Clean up any spills right away.  Avoid walking on wet floors.  Keep items that you use a lot in easy-to-reach places.  If you need to reach something above you, use a strong step stool that has a grab bar.  Keep electrical cords out of the way.  Do not use floor polish or wax that makes floors slippery. If you must use wax, use non-skid floor wax.  Do not have throw rugs and other things on the floor that can make you trip. What can I do with my stairs?  Do not leave any items on the stairs.  Make sure that there are handrails on both sides of the stairs and use them. Fix handrails that are broken or loose. Make sure that handrails are as long as the stairways.  Check any carpeting to make sure that it is firmly attached to the stairs. Fix any carpet that is loose or worn.  Avoid having throw rugs at the top or bottom of the stairs. If you do have throw rugs, attach them to the floor with carpet tape.  Make sure that you have a light switch at the top of the stairs and the bottom of the stairs. If you do not have them, ask someone to add them for you. What else can I do to help prevent falls?  Wear shoes that:  Do not have high heels.  Have rubber bottoms.  Are comfortable and fit you well.  Are closed at the toe. Do not wear sandals.  If you use a stepladder:  Make sure that it is fully opened. Do not climb a closed stepladder.  Make sure that both sides of the stepladder are locked into place.  Ask someone to hold it for you, if possible.  Clearly mark and make sure that you can see:  Any grab bars or handrails.  First and last steps.  Where the edge of each step is.  Use tools  that help you move around (mobility aids) if they are needed. These include:  Canes.  Walkers.  Scooters.  Crutches.  Turn on the lights when you go into a dark area. Replace any light bulbs as soon as they burn out.  Set up your furniture so you have a  clear path. Avoid moving your furniture around.  If any of your floors are uneven, fix them.  If there are any pets around you, be aware of where they are.  Review your medicines with your doctor. Some medicines can make you feel dizzy. This can increase your chance of falling. Ask your doctor what other things that you can do to help prevent falls. This information is not intended to replace advice given to you by your health care provider. Make sure you discuss any questions you have with your health care provider. Document Released: 05/23/2009 Document Revised: 01/02/2016 Document Reviewed: 08/31/2014  2017 Elsevier    Health Maintenance, Male A healthy lifestyle and preventative care can promote health and wellness.  Maintain regular health, dental, and eye exams.  Eat a healthy diet. Foods like vegetables, fruits, whole grains, low-fat dairy products, and lean protein foods contain the nutrients you need and are low in calories. Decrease your intake of foods high in solid fats, added sugars, and salt. Get information about a proper diet from your health care provider, if necessary.  Regular physical exercise is one of the most important things you can do for your health. Most adults should get at least 150 minutes of moderate-intensity exercise (any activity that increases your heart rate and causes you to sweat) each week. In addition, most adults need muscle-strengthening exercises on 2 or more days a week.   Maintain a healthy weight. The body mass index (BMI) is a screening tool to identify possible weight problems. It provides an estimate of body fat based on height and weight. Your health care provider can find your BMI  and can help you achieve or maintain a healthy weight. For males 20 years and older:  A BMI below 18.5 is considered underweight.  A BMI of 18.5 to 24.9 is normal.  A BMI of 25 to 29.9 is considered overweight.  A BMI of 30 and above is considered obese.  Maintain normal blood lipids and cholesterol by exercising and minimizing your intake of saturated fat. Eat a balanced diet with plenty of fruits and vegetables. Blood tests for lipids and cholesterol should begin at age 57 and be repeated every 5 years. If your lipid or cholesterol levels are high, you are over age 34, or you are at high risk for heart disease, you may need your cholesterol levels checked more frequently.Ongoing high lipid and cholesterol levels should be treated with medicines if diet and exercise are not working.  If you smoke, find out from your health care provider how to quit. If you do not use tobacco, do not start.  Lung cancer screening is recommended for adults aged 20-80 years who are at high risk for developing lung cancer because of a history of smoking. A yearly low-dose CT scan of the lungs is recommended for people who have at least a 30-pack-year history of smoking and are current smokers or have quit within the past 15 years. A pack year of smoking is smoking an average of 1 pack of cigarettes a day for 1 year (for example, a 30-pack-year history of smoking could mean smoking 1 pack a day for 30 years or 2 packs a day for 15 years). Yearly screening should continue until the smoker has stopped smoking for at least 15 years. Yearly screening should be stopped for people who develop a health problem that would prevent them from having lung cancer treatment.  If you choose to drink alcohol, do not have more  than 2 drinks per day. One drink is considered to be 12 oz (360 mL) of beer, 5 oz (150 mL) of wine, or 1.5 oz (45 mL) of liquor.  Avoid the use of street drugs. Do not share needles with anyone. Ask for help if  you need support or instructions about stopping the use of drugs.  High blood pressure causes heart disease and increases the risk of stroke. High blood pressure is more likely to develop in:  People who have blood pressure in the end of the normal range (100-139/85-89 mm Hg).  People who are overweight or obese.  People who are African American.  If you are 84-8 years of age, have your blood pressure checked every 3-5 years. If you are 59 years of age or older, have your blood pressure checked every year. You should have your blood pressure measured twice-once when you are at a hospital or clinic, and once when you are not at a hospital or clinic. Record the average of the two measurements. To check your blood pressure when you are not at a hospital or clinic, you can use:  An automated blood pressure machine at a pharmacy.  A home blood pressure monitor.  If you are 47-43 years old, ask your health care provider if you should take aspirin to prevent heart disease.  Diabetes screening involves taking a blood sample to check your fasting blood sugar level. This should be done once every 3 years after age 79 if you are at a normal weight and without risk factors for diabetes. Testing should be considered at a younger age or be carried out more frequently if you are overweight and have at least 1 risk factor for diabetes.  Colorectal cancer can be detected and often prevented. Most routine colorectal cancer screening begins at the age of 39 and continues through age 70. However, your health care provider may recommend screening at an earlier age if you have risk factors for colon cancer. On a yearly basis, your health care provider may provide home test kits to check for hidden blood in the stool. A small camera at the end of a tube may be used to directly examine the colon (sigmoidoscopy or colonoscopy) to detect the earliest forms of colorectal cancer. Talk to your health care provider about  this at age 58 when routine screening begins. A direct exam of the colon should be repeated every 5-10 years through age 64, unless early forms of precancerous polyps or small growths are found.  People who are at an increased risk for hepatitis B should be screened for this virus. You are considered at high risk for hepatitis B if:  You were born in a country where hepatitis B occurs often. Talk with your health care provider about which countries are considered high risk.  Your parents were born in a high-risk country and you have not received a shot to protect against hepatitis B (hepatitis B vaccine).  You have HIV or AIDS.  You use needles to inject street drugs.  You live with, or have sex with, someone who has hepatitis B.  You are a man who has sex with other men (MSM).  You get hemodialysis treatment.  You take certain medicines for conditions like cancer, organ transplantation, and autoimmune conditions.  Hepatitis C blood testing is recommended for all people born from [birth year] through 1967 and any individual with known risk factors for hepatitis C.  Healthy men should no longer receive prostate-specific antigen (PSA)  blood tests as part of routine cancer screening. Talk to your health care provider about prostate cancer screening.  Testicular cancer screening is not recommended for adolescents or adult males who have no symptoms. Screening includes self-exam, a health care provider exam, and other screening tests. Consult with your health care provider about any symptoms you have or any concerns you have about testicular cancer.  Practice safe sex. Use condoms and avoid high-risk sexual practices to reduce the spread of sexually transmitted infections (STIs).  You should be screened for STIs, including gonorrhea and chlamydia if:  You are sexually active and are younger than 24 years.  You are older than 24 years, and your health care provider tells you that you are at risk  for this type of infection.  Your sexual activity has changed since you were last screened, and you are at an increased risk for chlamydia or gonorrhea. Ask your health care provider if you are at risk.  If you are at risk of being infected with HIV, it is recommended that you take a prescription medicine daily to prevent HIV infection. This is called pre-exposure prophylaxis (PrEP). You are considered at risk if:  You are a man who has sex with other men (MSM).  You are a heterosexual man who is sexually active with multiple partners.  You take drugs by injection.  You are sexually active with a partner who has HIV.  Talk with your health care provider about whether you are at high risk of being infected with HIV. If you choose to begin PrEP, you should first be tested for HIV. You should then be tested every 3 months for as long as you are taking PrEP.  Use sunscreen. Apply sunscreen liberally and repeatedly throughout the day. You should seek shade when your shadow is shorter than you. Protect yourself by wearing long sleeves, pants, a wide-brimmed hat, and sunglasses year round whenever you are outdoors.  Tell your health care provider of new moles or changes in moles, especially if there is a change in shape or color. Also, tell your health care provider if a mole is larger than the size of a pencil eraser.  A one-time screening for abdominal aortic aneurysm (AAA) and surgical repair of large AAAs by ultrasound is recommended for men aged 19-75 years who are current or former smokers.  Stay current with your vaccines (immunizations). This information is not intended to replace advice given to you by your health care provider. Make sure you discuss any questions you have with your health care provider. Document Released: 01/23/2008 Document Revised: 08/17/2014 Document Reviewed: 04/30/2015 Elsevier Interactive Patient Education  2017 [employer].

## 2016-07-31 NOTE — Progress Notes (Signed)
Subjective:   Scott Vasquez is a 75 y.o. male who presents for Medicare Annual/Subsequent preventive examination.  The Patient was informed that the wellness visit is to identify future health risk and educate and initiate measures that can reduce risk for increased disease through the lifespan.    Medicare Wellness Visit  Describes health as good, fair or great?   Preventive Screening -Counseling & Management  Colonoscopy 12/2002/ no repeat  Smoking history/ quit 1960;  Smokeless tobacco  Second Hand Smoke status; No Smokers in the home  ETOH no  RISK FACTORS Regular exercise  Works with [employer] and walks for all day x 5 days a week  Diet Watches what he eats Wife cooks Breakfast eggs; sausage,  (backed off since BP up)  Lunch salad - subway Supper - full meal   Fall risk no Mobility of Functional changes this year? No     Cardiac Risk Factors:  Advanced aged > 5 in men; >65 in women Hyperlipidemia chol 116; Trig 33; HDL 49 and LDL 60  Diabetes/ trended up to 8.7 / bs running 120 to 130  States he had a cold and feels this made it higher Family History with risk of stroke and HTN' DM  Obesity  Eye exam last one 12/2014  Has one every year Dr. Cathren Laine get your eye report   Depression Screen/ no  PhQ 2: negative  Activities of Daily Living - See functional screen   Cognitive testing; Ad8 score; 0 or less than 2  MMSE deferred or completed if AD8 + 2 issues  Advanced Directives no; patient has declined in the past  Patient Care Team: [name], NP as PCP - General (Family Medicine)   Immunization History  Administered Date(s) Administered  . Influenza Split 07/13/2011, 08/19/2012  . Influenza Whole 08/15/2004, 06/10/2007, 05/17/2008, 05/03/2009, 05/02/2010  . Influenza, High Dose Seasonal PF 06/07/2015, 05/29/2016  . Influenza,inj,Quad PF,36+ Mos 05/26/2013, 04/20/2014  . Pneumococcal Conjugate-13 11/26/2014  . Pneumococcal  Polysaccharide-23 01/16/2011  . Td 08/10/2004  . Tdap 11/26/2014   Required Immunizations needed today  Screening test up to date or reviewed for plan of completion Health Maintenance Due  Topic Date Due  . FOOT EXAM  11/26/2015  . OPHTHALMOLOGY EXAM  12/28/2015    Cardiac Risk Factors include: advanced age (>28mn, >>23women);diabetes mellitus;dyslipidemia;male gender;family history of premature cardiovascular disease;obesity (BMI >30kg/m2)     Objective:    Vitals: BP (!) 154/60   Pulse 82   Ht 5' 10.5" (1.791 m)   Wt 208 lb (94.3 kg)   SpO2 97%   BMI 29.42 kg/m   Body mass index is 29.42 kg/m.  Tobacco History  Smoking Status  . Former Smoker  . Quit date: 08/10/1958  Smokeless Tobacco  . Never Used     Counseling given: Yes   Past Medical History:  Diagnosis Date  . Anemia    iron deficiency  . Diabetes mellitus    type II  . Diverticulosis of colon   . Hypertension   . Stroke ([facility]    numbness in left hand  . Thyroid nodule    Past Surgical History:  Procedure Laterality Date  . BACK SURGERY    . CATARACT EXTRACTION, BILATERAL    . SHOULDER SURGERY  08/2007   Supple  . THYROID LOBECTOMY Left 04/29/2015  . THYROID LOBECTOMY Left 04/29/2015   Procedure: THYROID LOBECTOMY;  Surgeon: TArmandina Gemma MD;  Location: [facility]  Service: General;  Laterality: Left;  Family History  Problem Relation Age of Onset  . Diabetes    . Diabetes Father     Died from diabetic coma in Oct 11, 1970  . Hypertension Father   . Stroke Mother     Died in 28  . Hypertension Mother    History  Sexual Activity  . Sexual activity: Not on file    Outpatient Encounter Prescriptions as of 07/31/2016  Medication Sig  . amLODipine (NORVASC) 5 MG tablet TAKE ONE TABLET BY MOUTH DAILY.  Marland Kitchen atorvastatin (LIPITOR) 40 MG tablet TAKE ONE TABLET BY MOUTH DAILY.  Marland Kitchen Blood Glucose Monitoring Suppl (ONE TOUCH ULTRA SYSTEM KIT) W/DEVICE KIT Use to check blood sugar twice a day  . clopidogrel  (PLAVIX) 75 MG tablet TAKE ONE TABLET BY MOUTH DAILY.  . cyanocobalamin 100 MCG tablet Take 100 mcg by mouth daily.  Marland Kitchen donepezil (ARICEPT) 10 MG tablet Take 1 tablet (10 mg total) by mouth at bedtime.  Marland Kitchen glipiZIDE (GLUCOTROL) 5 MG tablet Take 2 tablets by mouth twice daily.  Marland Kitchen HUMULIN 70/30 (70-30) 100 UNIT/ML injection INJECT 10 UNITS SUBCUTANEOUSLY TWICE DAILY (BEFORE BREAKFAST AND DINNER)  . Insulin Syringe-Needle U-100 (INSULIN SYRINGE 1CC/31GX5/16") 31G X 5/16" 1 ML MISC Use two times a day as directed.   Marland Kitchen levocetirizine (XYZAL) 5 MG tablet TAKE ONE TABLET BY MOUTH EVERY EVENING.  Marland Kitchen levothyroxine (SYNTHROID, LEVOTHROID) 50 MCG tablet Take 50 mcg by mouth daily.  Marland Kitchen lisinopril (PRINIVIL,ZESTRIL) 40 MG tablet TAKE ONE TABLET BY MOUTH DAILY.  . meloxicam (MOBIC) 7.5 MG tablet TAKE ONE TABLET BY MOUTH ONCE DAILY.  . metFORMIN (GLUCOPHAGE) 1000 MG tablet Take 1 tablet (1,000 mg total) by mouth 2 (two) times daily with a meal.  . metFORMIN (GLUCOPHAGE) 500 MG tablet TAKE TWO (2) TABLETS BY MOUTH TWICE DAILY.  . ONE TOUCH ULTRA TEST test strip Check blood sugar twice daily.  Glory Rosebush DELICA LANCETS 83T MISC 1 each by Other route See admin instructions. Check blood sugar twice daily  . tamsulosin (FLOMAX) 0.4 MG CAPS capsule Take 1 capsule (0.4 mg total) by mouth daily.  . TRADJENTA 5 MG TABS tablet TAKE ONE TABLET BY MOUTH DAILY  . HYDROcodone-acetaminophen (NORCO/VICODIN) 5-325 MG per tablet Take 1-2 tablets by mouth every 4 (four) hours as needed for moderate pain. (Patient not taking: Reported on 07/31/2016)   No facility-administered encounter medications on file as of 07/31/2016.     Activities of Daily Living In your present state of health, do you have any difficulty performing the following activities: 07/31/2016  Hearing? N  Vision? N  Difficulty concentrating or making decisions? Y  Walking or climbing stairs? N  Dressing or bathing? N  Doing errands, shopping? N  Preparing  Food and eating ? N  Using the Toilet? N  In the past six months, have you accidently leaked urine? N  Do you have problems with loss of bowel control? N  Managing your Medications? N  Managing your Finances? N  Housekeeping or managing your Housekeeping? N  Some recent data might be hidden    Patient Care Team: [name], NP as PCP - General (Family Medicine)   Assessment:    Exercise Activities and Dietary recommendations Current Exercise Habits: Home exercise routine, Type of exercise: walking, Time (Minutes): 60, Frequency (Times/Week): 5, Weekly Exercise (Minutes/Week): 300, Intensity: Moderate  Goals    . patient          Keep working and stay active      Fall Risk  Fall Risk  07/31/2016 04/03/2016 11/26/2014 07/20/2014 03/10/2013  Falls in the past year? No No No No No   Depression Screen PHQ 2/9 Scores 07/31/2016 04/03/2016 11/26/2014 07/20/2014  PHQ - 2 Score 0 0 0 0    Cognitive Function MMSE - Mini Mental State Exam 07/31/2016  Orientation to time 4  Orientation to Place 5  Registration 3  Attention/ Calculation 1  Recall 0  Language- name 2 objects 2  Language- repeat 1  Language- follow 3 step command 2  Language- read & follow direction 1  Write a sentence 1  Copy design 1  Total score 21      Diabetic Foot Exam - Simple   Simple Foot Form Diabetic Foot exam was performed with the following findings:  Yes 07/31/2016  9:01 AM  Visual Inspection No deformities, no ulcerations, no other skin breakdown bilaterally:  Yes Sensation Testing Intact to touch and monofilament testing bilaterally:  Yes Pulse Check See comments:  Yes Comments Dorsalis pedis pulse 1+  Feet warm and dry No issues noted Sensation intact except diminished in toes and ball of foot on the right        Immunization History  Administered Date(s) Administered  . Influenza Split 07/13/2011, 08/19/2012  . Influenza Whole 08/15/2004, 06/10/2007, 05/17/2008, 05/03/2009,  05/02/2010  . Influenza, High Dose Seasonal PF 06/07/2015, 05/29/2016  . Influenza,inj,Quad PF,36+ Mos 05/26/2013, 04/20/2014  . Pneumococcal Conjugate-13 11/26/2014  . Pneumococcal Polysaccharide-23 01/16/2011  . Td 08/10/2004  . Tdap 11/26/2014   Screening Tests Health Maintenance  Topic Date Due  . FOOT EXAM  11/26/2015  . OPHTHALMOLOGY EXAM  12/28/2015  . ZOSTAVAX  07/30/2017 (Originally 05/14/1999)  . HEMOGLOBIN A1C  01/08/2017  . TETANUS/TDAP  11/25/2024  . INFLUENZA VACCINE  Completed  . PNA vac Low Risk Adult  Completed      Plan:      PCP Notes  Health Maintenance Eye exam completed Dr. Katy Fitch; normal  I will try to get report   Educated on shingles; does not feel he had the chicken pox; will defer shingles   Abnormal Screens no  Referrals none discussed   Patient concerns; none Does take his own medicine  Nurse Concerns; probably functions at a higher level than 21/30 MMSE would indicate Judgement intact Decision making skills intact  Next PCP apt today   During the course of the visit the patient was educated and counseled about the following appropriate screening and preventive services:   Vaccines to include Pneumoccal, Influenza, Hepatitis B, Td, Zostavax, HCV  Electrocardiogram  Cardiovascular Disease  Colorectal cancer screening  Diabetes screening  Prostate Cancer Screening  Glaucoma screening  Nutrition counseling   Smoking cessation counseling  Patient Instructions (the written plan) was given to the patient.    Wynetta Fines, RN  07/31/2016

## 2016-08-11 NOTE — Telephone Encounter (Signed)
fup documentation to [facility] Call to Dr. Katy Fitch office and  Confirmed eye apt on 01/10/2016 and the next eye apt is 01/15/2017 HM updated

## 2016-08-24 ENCOUNTER — Other Ambulatory Visit: Payer: Self-pay | Admitting: Surgery

## 2016-08-24 DIAGNOSIS — E079 Disorder of thyroid, unspecified: Secondary | ICD-10-CM

## 2016-08-28 ENCOUNTER — Ambulatory Visit
Admission: RE | Admit: 2016-08-28 | Discharge: 2016-08-28 | Disposition: A | Discharge status: Home / Self Care | Payer: BLUE CROSS/BLUE SHIELD | Source: Ambulatory Visit | Attending: Surgery | Admitting: Surgery

## 2016-08-28 DIAGNOSIS — E079 Disorder of thyroid, unspecified: Secondary | ICD-10-CM

## 2016-09-02 ENCOUNTER — Other Ambulatory Visit: Payer: Self-pay | Admitting: Adult Health

## 2016-09-03 NOTE — Telephone Encounter (Signed)
Ok to refill for one year  

## 2016-09-08 ENCOUNTER — Other Ambulatory Visit: Payer: Self-pay | Admitting: Adult Health

## 2016-09-08 NOTE — Telephone Encounter (Signed)
Ok to refill for one year  

## 2016-09-16 ENCOUNTER — Other Ambulatory Visit: Payer: Self-pay | Admitting: Adult Health

## 2016-09-16 DIAGNOSIS — R4189 Other symptoms and signs involving cognitive functions and awareness: Secondary | ICD-10-CM

## 2016-09-16 NOTE — Telephone Encounter (Signed)
Ok to refill for 6 months 

## 2016-09-16 NOTE — Telephone Encounter (Signed)
Ok to refill 

## 2016-09-21 ENCOUNTER — Encounter: Payer: BLUE CROSS/BLUE SHIELD | Admitting: Psychology

## 2016-10-02 ENCOUNTER — Other Ambulatory Visit: Payer: Self-pay | Admitting: Adult Health

## 2016-10-15 ENCOUNTER — Telehealth: Payer: Self-pay | Admitting: Adult Health

## 2016-10-15 ENCOUNTER — Other Ambulatory Visit: Payer: Self-pay

## 2016-10-15 MED ORDER — INSULIN NPH ISOPHANE & REGULAR (70-30) 100 UNIT/ML ~~LOC~~ SUSP: SUBCUTANEOUS | 3 refills | Status: DC

## 2016-10-15 NOTE — Telephone Encounter (Signed)
Pt need new Rx for HUMULIN  Pharm:  [facility] in [place]

## 2016-10-15 NOTE — Telephone Encounter (Signed)
Rx has been refilled and sent to preferred pharmacy.

## 2016-10-30 ENCOUNTER — Encounter: Payer: Self-pay | Admitting: Adult Health

## 2016-10-30 ENCOUNTER — Ambulatory Visit (INDEPENDENT_AMBULATORY_CARE_PROVIDER_SITE_OTHER): Payer: BLUE CROSS/BLUE SHIELD | Admitting: Adult Health

## 2016-10-30 VITALS — BP 142/70 | Temp 98.2°F | Ht 70.5 in | Wt 206.8 lb

## 2016-10-30 DIAGNOSIS — G8929 Other chronic pain: Secondary | ICD-10-CM | POA: Diagnosis not present

## 2016-10-30 DIAGNOSIS — J3489 Other specified disorders of nose and nasal sinuses: Secondary | ICD-10-CM | POA: Diagnosis not present

## 2016-10-30 DIAGNOSIS — M545 Low back pain: Secondary | ICD-10-CM

## 2016-10-30 MED ORDER — INSULIN NPH ISOPHANE & REGULAR (70-30) 100 UNIT/ML ~~LOC~~ SUSP: SUBCUTANEOUS | 3 refills | Status: DC

## 2016-10-30 MED ORDER — AZELASTINE-FLUTICASONE 137-50 MCG/ACT NA SUSP: 1.0000 | Freq: Two times a day (BID) | NASAL | 3 refills | Status: DC

## 2016-10-30 MED ORDER — HYDROCODONE-ACETAMINOPHEN 5-325 MG PO TABS: 1.0000 | ORAL_TABLET | ORAL | 0 refills | Status: AC | PRN

## 2016-10-30 NOTE — Progress Notes (Signed)
Subjective:    Patient ID: Scott Vasquez, male    DOB: April 29, 1941, 76 y.o.   MRN: [id number]  HPI  76 year old male who  has a past medical history of Anemia; Diabetes mellitus; Diverticulosis of colon; Hypertension; Stroke [facility]); and Thyroid nodule. He presents to the clinic today for an acute complaint of rhinorrhea. He reports that for the last year he has had issues with clear nasal discharge. He denies any sinus pain and pressure. He has used multiple over the counter medications ( unsure of what these medications have been). He is not currently using anything to help with allergy symptoms.   He has not noticed any fevers or is feeling ill.    He has started a new job at [employer] and is standing for long periods of time. He needs a small amount of his Hydrocodone filled for when the pain is not controlled with Motrin     Review of Systems See HPI   Past Medical History:  Diagnosis Date  . Anemia    iron deficiency  . Diabetes mellitus    type II  . Diverticulosis of colon   . Hypertension   . Stroke ([facility])    numbness in left hand  . Thyroid nodule     Social History   Social History  . Marital status: Married    Spouse name: N/A  . Number of children: N/A  . Years of education: N/A   Occupational History  . retired Paediatric nurse   Social History Main Topics  . Smoking status: Former Smoker    Quit date: 08/10/1958  . Smokeless tobacco: Never Used  . Alcohol use No  . Drug use: No  . Sexual activity: Not on file   Other Topics Concern  . Not on file   Social History Narrative   Works at [employer] for 10 years/ works 5 days ; 40 hour week    - Married with two children who live locally   - No pets   - Attend church for fun.       Past Surgical History:  Procedure Laterality Date  . BACK SURGERY    . CATARACT EXTRACTION, BILATERAL    . SHOULDER SURGERY  08/2007   Supple  . THYROID LOBECTOMY Left 04/29/2015  . THYROID LOBECTOMY Left 04/29/2015   Procedure:  THYROID LOBECTOMY;  Surgeon: Armandina Gemma, MD;  Location: [facility] OR;  Service: General;  Laterality: Left;    Family History  Problem Relation Age of Onset  . Diabetes    . Diabetes Father     Died from diabetic coma in 10-22-70  . Hypertension Father   . Stroke Mother     Died in 31  . Hypertension Mother     No Known Allergies  Current Outpatient Prescriptions on File Prior to Visit  Medication Sig Dispense Refill  . amLODipine (NORVASC) 10 MG tablet Take 1 tablet (10 mg total) by mouth daily. 90 tablet 3  . atorvastatin (LIPITOR) 40 MG tablet TAKE ONE TABLET BY MOUTH DAILY. 90 tablet 3  . Blood Glucose Monitoring Suppl (ONE TOUCH ULTRA SYSTEM KIT) W/DEVICE KIT Use to check blood sugar twice a day 1 each 0  . clopidogrel (PLAVIX) 75 MG tablet TAKE ONE TABLET BY MOUTH DAILY. 90 tablet 3  . cyanocobalamin 100 MCG tablet Take 100 mcg by mouth daily.    Marland Kitchen donepezil (ARICEPT) 10 MG tablet TAKE ONE TABLET BY MOUTH AT BEDTIME 90 tablet 1  .  glipiZIDE (GLUCOTROL) 5 MG tablet Take 2 tablets by mouth twice daily. 120 tablet 5  . Insulin Syringe-Needle U-100 (INSULIN SYRINGE 1CC/31GX5/16") 31G X 5/16" 1 ML MISC Use two times a day as directed.     Marland Kitchen levocetirizine (XYZAL) 5 MG tablet TAKE ONE TABLET BY MOUTH EVERY EVENING. 30 tablet 5  . levothyroxine (SYNTHROID, LEVOTHROID) 50 MCG tablet Take 50 mcg by mouth daily.  6  . lisinopril (PRINIVIL,ZESTRIL) 40 MG tablet TAKE ONE TABLET BY MOUTH DAILY. 90 tablet 3  . meloxicam (MOBIC) 7.5 MG tablet TAKE ONE TABLET BY MOUTH ONCE DAILY. 30 tablet 0  . metFORMIN (GLUCOPHAGE) 1000 MG tablet Take 1 tablet (1,000 mg total) by mouth 2 (two) times daily with a meal. 180 tablet 3  . metFORMIN (GLUCOPHAGE) 500 MG tablet TAKE TWO (2) TABLETS BY MOUTH TWICE DAILY. 360 tablet 1  . ONE TOUCH ULTRA TEST test strip Check blood sugar twice daily. 100 each 5  . ONETOUCH DELICA LANCETS 03B MISC 1 each by Other route See admin instructions. Check blood sugar twice daily  0    . tamsulosin (FLOMAX) 0.4 MG CAPS capsule Take 1 capsule (0.4 mg total) by mouth daily. 30 capsule 3  . TRADJENTA 5 MG TABS tablet TAKE ONE TABLET BY MOUTH DAILY 30 tablet 4   No current facility-administered medications on file prior to visit.     BP (!) 142/70 (BP Location: Left Arm, Patient Position: Sitting, Cuff Size: Normal)   Temp 98.2 F (36.8 C) (Oral)   Ht 5' 10.5" (1.791 m)   Wt 206 lb 12.8 oz (93.8 kg)   BMI 29.25 kg/m       Objective:   Physical Exam  Constitutional: He is oriented to person, place, and time. He appears well-developed and well-nourished. No distress.  HENT:  Nose: Rhinorrhea present. No mucosal edema. Right sinus exhibits no maxillary sinus tenderness and no frontal sinus tenderness. Left sinus exhibits no maxillary sinus tenderness and no frontal sinus tenderness.  Mouth/Throat: Uvula is midline, oropharynx is clear and moist and mucous membranes are normal.  Eyes: Conjunctivae and EOM are normal. Pupils are equal, round, and reactive to light. Right eye exhibits no discharge. Left eye exhibits no discharge. No scleral icterus.  Cardiovascular: Normal rate, regular rhythm, normal heart sounds and intact distal pulses.  Exam reveals no gallop and no friction rub.   No murmur heard. Neurological: He is alert and oriented to person, place, and time.  Skin: Skin is warm and dry. No rash noted. He is not diaphoretic. No erythema. No pallor.  Psychiatric: He has a normal mood and affect. His behavior is normal. Judgment and thought content normal.  Nursing note and vitals reviewed.      Assessment & Plan:  1. Rhinorrhea - Clear drainage. Appears as seasonal allergies - Azelastine-Fluticasone 137-50 MCG/ACT SUSP; Place 1 spray into the nose 2 (two) times daily.  Dispense: 23 g; Refill: 3 - Consider referral to ENT or Allergy   2. Chronic bilateral low back pain without sciatica - Take only for break through pain. Motrin as needed -  HYDROcodone-acetaminophen (NORCO/VICODIN) 5-325 MG tablet; Take 1 tablet by mouth every 4 (four) hours as needed for moderate pain.  Dispense: 12 tablet; Refill: 0   [name], NP

## 2016-11-30 ENCOUNTER — Other Ambulatory Visit: Payer: Self-pay | Admitting: Adult Health

## 2016-11-30 DIAGNOSIS — R350 Frequency of micturition: Secondary | ICD-10-CM

## 2016-12-07 ENCOUNTER — Other Ambulatory Visit: Payer: Self-pay | Admitting: Adult Health

## 2016-12-07 MED ORDER — LISINOPRIL 40 MG PO TABS: 40.0000 mg | ORAL_TABLET | Freq: Every day | ORAL | 3 refills | Status: DC

## 2016-12-07 MED ORDER — ATORVASTATIN CALCIUM 40 MG PO TABS: 40.0000 mg | ORAL_TABLET | Freq: Every day | ORAL | 3 refills | Status: DC

## 2016-12-07 NOTE — Telephone Encounter (Signed)
Pt needs new rx lisinopril , atorvastatin #90 w/refill sent to [facility] [place]

## 2016-12-07 NOTE — Telephone Encounter (Signed)
Refills sent to pharmacy. Nothing further needed.

## 2017-01-05 ENCOUNTER — Other Ambulatory Visit: Payer: Self-pay | Admitting: Adult Health

## 2017-01-05 NOTE — Telephone Encounter (Signed)
Patient's last TSH lab was 07/31/16. Ok to refill?

## 2017-01-05 NOTE — Telephone Encounter (Signed)
That is ok to refill for six months

## 2017-01-15 DIAGNOSIS — E119 Type 2 diabetes mellitus without complications: Secondary | ICD-10-CM | POA: Diagnosis not present

## 2017-01-15 DIAGNOSIS — Z961 Presence of intraocular lens: Secondary | ICD-10-CM | POA: Diagnosis not present

## 2017-01-15 LAB — HM DIABETES EYE EXAM

## 2017-01-16 ENCOUNTER — Other Ambulatory Visit: Payer: Self-pay | Admitting: Adult Health

## 2017-01-16 DIAGNOSIS — R4189 Other symptoms and signs involving cognitive functions and awareness: Secondary | ICD-10-CM

## 2017-01-29 ENCOUNTER — Ambulatory Visit (INDEPENDENT_AMBULATORY_CARE_PROVIDER_SITE_OTHER)
Admission: RE | Admit: 2017-01-29 | Discharge: 2017-01-29 | Disposition: A | Discharge status: Home / Self Care | Payer: BLUE CROSS/BLUE SHIELD | Source: Ambulatory Visit | Attending: Adult Health | Admitting: Adult Health

## 2017-01-29 ENCOUNTER — Encounter: Payer: Self-pay | Admitting: Adult Health

## 2017-01-29 ENCOUNTER — Ambulatory Visit (INDEPENDENT_AMBULATORY_CARE_PROVIDER_SITE_OTHER): Payer: BLUE CROSS/BLUE SHIELD | Admitting: Adult Health

## 2017-01-29 ENCOUNTER — Other Ambulatory Visit: Payer: Self-pay | Admitting: Adult Health

## 2017-01-29 VITALS — BP 124/62 | Temp 98.6°F | Wt 206.0 lb

## 2017-01-29 DIAGNOSIS — S6991XA Unspecified injury of right wrist, hand and finger(s), initial encounter: Secondary | ICD-10-CM

## 2017-01-29 DIAGNOSIS — M7989 Other specified soft tissue disorders: Secondary | ICD-10-CM | POA: Diagnosis not present

## 2017-01-29 MED ORDER — IPRATROPIUM BROMIDE 0.03 % NA SOLN: 2.0000 | Freq: Two times a day (BID) | NASAL | 12 refills | Status: DC

## 2017-01-29 NOTE — Progress Notes (Signed)
 Subjective:    Patient ID: Scott Vasquez, male    DOB: 04/25/1941, 75 y.o.   MRN: [id number]  HPI  75 year old male who presents to the office today with the complaint of a mass on his right index finger. This mass has been present for 2 months. Denies any pain, redness, or warmth  Review of Systems See HPI   Past Medical History:  Diagnosis Date  . Anemia    iron deficiency  . Diabetes mellitus    type II  . Diverticulosis of colon   . Hypertension   . Stroke ([facility])    numbness in left hand  . Thyroid nodule     Social History   Social History  . Marital status: Married    Spouse name: N/A  . Number of children: N/A  . Years of education: N/A   Occupational History  . retired [employer]   Social History Main Topics  . Smoking status: Former Smoker    Quit date: 08/10/1958  . Smokeless tobacco: Never Used  . Alcohol use No  . Drug use: No  . Sexual activity: Not on file   Other Topics Concern  . Not on file   Social History Narrative   Works at [employer] for 10 years/ works 5 days ; 40 hour week    - Married with two children who live locally   - No pets   - Attend church for fun.       Past Surgical History:  Procedure Laterality Date  . BACK SURGERY    . CATARACT EXTRACTION, BILATERAL    . SHOULDER SURGERY  08/2007   Supple  . THYROID LOBECTOMY Left 04/29/2015  . THYROID LOBECTOMY Left 04/29/2015   Procedure: THYROID LOBECTOMY;  Surgeon: Todd Gerkin, MD;  Location: [facility] OR;  Service: General;  Laterality: Left;    Family History  Problem Relation Age of Onset  . Diabetes Unknown   . Diabetes Father        Died from diabetic coma in 1972  . Hypertension Father   . Stroke Mother        Died in 1972  . Hypertension Mother     No Known Allergies  Current Outpatient Prescriptions on File Prior to Visit  Medication Sig Dispense Refill  . amLODipine (NORVASC) 10 MG tablet Take 1 tablet (10 mg total) by mouth daily. 90 tablet 3  . atorvastatin (LIPITOR)  40 MG tablet Take 1 tablet (40 mg total) by mouth daily. 90 tablet 3  . Azelastine-Fluticasone 137-50 MCG/ACT SUSP Place 1 spray into the nose 2 (two) times daily. 23 g 3  . Blood Glucose Monitoring Suppl (ONE TOUCH ULTRA SYSTEM KIT) W/DEVICE KIT Use to check blood sugar twice a day 1 each 0  . clopidogrel (PLAVIX) 75 MG tablet TAKE ONE TABLET BY MOUTH DAILY. 90 tablet 3  . cyanocobalamin 100 MCG tablet Take 100 mcg by mouth daily.    . donepezil (ARICEPT) 10 MG tablet TAKE ONE TABLET BY MOUTH AT BEDTIME 90 tablet 1  . glipiZIDE (GLUCOTROL) 5 MG tablet Take 2 tablets by mouth twice daily. 120 tablet 5  . insulin NPH-regular Human (HUMULIN 70/30) (70-30) 100 UNIT/ML injection INJECT 10 UNITS SUBCUTANEOUSLY TWICE DAILY (BEFORE BREAKFAST AND DINNER) 10 mL 3  . Insulin Syringe-Needle U-100 (INSULIN SYRINGE 1CC/31GX5/16") 31G X 5/16" 1 ML MISC Use two times a day as directed.     . levocetirizine (XYZAL) 5 MG tablet TAKE ONE TABLET BY   MOUTH EVERY EVENING. 30 tablet 5  . levothyroxine (SYNTHROID, LEVOTHROID) 50 MCG tablet TAKE ONE TABLET BY MOUTH ONCE DAILY 90 tablet 1  . lisinopril (PRINIVIL,ZESTRIL) 40 MG tablet Take 1 tablet (40 mg total) by mouth daily. 90 tablet 3  . meloxicam (MOBIC) 7.5 MG tablet TAKE ONE TABLET BY MOUTH ONCE DAILY. 30 tablet 0  . metFORMIN (GLUCOPHAGE) 1000 MG tablet Take 1 tablet (1,000 mg total) by mouth 2 (two) times daily with a meal. 180 tablet 3  . metFORMIN (GLUCOPHAGE) 500 MG tablet TAKE TWO (2) TABLETS BY MOUTH TWICE DAILY. 360 tablet 1  . ONE TOUCH ULTRA TEST test strip Check blood sugar twice daily. 100 each 5  . ONETOUCH DELICA LANCETS 33G MISC 1 each by Other route See admin instructions. Check blood sugar twice daily  0  . tamsulosin (FLOMAX) 0.4 MG CAPS capsule TAKE ONE CAPSULE BY MOUTH DAILY 30 capsule 2  . TRADJENTA 5 MG TABS tablet TAKE ONE TABLET BY MOUTH DAILY 30 tablet 4   No current facility-administered medications on file prior to visit.     BP 124/62    Wt 206 lb (93.4 kg)   BMI 29.14 kg/m       Objective:   Physical Exam  Constitutional: He is oriented to person, place, and time. He appears well-developed and well-nourished. No distress.  Neurological: He is alert and oriented to person, place, and time.  Skin: Skin is warm and dry. No rash noted. He is not diaphoretic. No erythema. No pallor.  Dime sized mass on pad of right index finger. Partially fluid filled. No redness, warmth or pain noted   Psychiatric: He has a normal mood and affect. His behavior is normal. Judgment and thought content normal.  Nursing note and vitals reviewed.     Assessment & Plan:  1. Injury of right index finger, initial encounter - will get x ray to see if there is any foreign body. Consider referral to hand surgery  - DG Hand Complete Right; Future   , NP  

## 2017-02-04 ENCOUNTER — Other Ambulatory Visit: Payer: Self-pay

## 2017-02-04 ENCOUNTER — Telehealth: Payer: Self-pay | Admitting: Adult Health

## 2017-02-04 DIAGNOSIS — R350 Frequency of micturition: Secondary | ICD-10-CM

## 2017-02-04 MED ORDER — TAMSULOSIN HCL 0.4 MG PO CAPS: 0.4000 mg | ORAL_CAPSULE | Freq: Every day | ORAL | 2 refills | Status: DC

## 2017-02-04 NOTE — Telephone Encounter (Signed)
Pt needs refill on flomax [facility] [place]

## 2017-02-04 NOTE — Telephone Encounter (Signed)
Rx has been refilled as directed.

## 2017-02-08 ENCOUNTER — Encounter: Payer: Self-pay | Admitting: Family Medicine

## 2017-02-12 DIAGNOSIS — R2231 Localized swelling, mass and lump, right upper limb: Secondary | ICD-10-CM | POA: Diagnosis not present

## 2017-03-01 ENCOUNTER — Telehealth: Payer: Self-pay | Admitting: Adult Health

## 2017-03-01 NOTE — Telephone Encounter (Signed)
Spoke to the pt and informed him to stop the Plavix and Mobic.  Pt agreed.

## 2017-03-01 NOTE — Telephone Encounter (Signed)
Pt is having finger surgery 03/03/17 8:00am and Dr. Suezanne Cheshire stated that he needs to stop all of his medication pt is very concerned about the and want to know which ones he should stop taking.  Pt would like to have a call back.

## 2017-03-01 NOTE — Telephone Encounter (Signed)
Yes stop Plavix and Mobic. I do not see why he cannot taking any of his other medications

## 2017-03-01 NOTE — Telephone Encounter (Signed)
Scott Vasquez, would assume pt only needs to stop Plavix.  Please advise.

## 2017-03-03 ENCOUNTER — Other Ambulatory Visit: Payer: Self-pay

## 2017-03-03 DIAGNOSIS — D481 Neoplasm of uncertain behavior of connective and other soft tissue: Secondary | ICD-10-CM | POA: Diagnosis not present

## 2017-03-03 DIAGNOSIS — L929 Granulomatous disorder of the skin and subcutaneous tissue, unspecified: Secondary | ICD-10-CM | POA: Diagnosis not present

## 2017-03-03 DIAGNOSIS — R2231 Localized swelling, mass and lump, right upper limb: Secondary | ICD-10-CM | POA: Diagnosis not present

## 2017-03-03 DIAGNOSIS — M795 Residual foreign body in soft tissue: Secondary | ICD-10-CM | POA: Diagnosis not present

## 2017-03-03 DIAGNOSIS — M7989 Other specified soft tissue disorders: Secondary | ICD-10-CM | POA: Diagnosis not present

## 2017-03-24 ENCOUNTER — Telehealth: Payer: Self-pay | Admitting: Adult Health

## 2017-03-24 MED ORDER — DONEPEZIL HCL 23 MG PO TABS: 23.0000 mg | ORAL_TABLET | Freq: Every day | ORAL | 3 refills | Status: DC

## 2017-03-24 NOTE — Telephone Encounter (Signed)
I misunderstood. Please increase to 23 mg daily.Madaline Brilliant to refill for one year

## 2017-03-24 NOTE — Telephone Encounter (Signed)
Pt currently taking 10 mg.  Is there something else to add?

## 2017-03-24 NOTE — Telephone Encounter (Signed)
Scott Vasquez notified that new rx has been sent in to the pharmacy.

## 2017-03-24 NOTE — Telephone Encounter (Signed)
Pt wife is calling and would like to increase aricept 10 mg to next strength. [facility] [place] [facility]

## 2017-03-24 NOTE — Telephone Encounter (Signed)
It is ok to increase Aricept to 10 mg. Refill for 6 months

## 2017-04-01 ENCOUNTER — Other Ambulatory Visit: Payer: Self-pay | Admitting: Adult Health

## 2017-04-01 DIAGNOSIS — R350 Frequency of micturition: Secondary | ICD-10-CM

## 2017-04-01 NOTE — Telephone Encounter (Signed)
Sent to the pharmacy by e-scribe. 

## 2017-04-10 ENCOUNTER — Other Ambulatory Visit: Payer: Self-pay | Admitting: Adult Health

## 2017-04-13 NOTE — Telephone Encounter (Signed)
DENIED.  REFILL REQUEST IS TOO EARLY.  FILLED FOR 6 MONTHS ON 12/2016.  MESSAGE SENT TO THE PHARMACY.

## 2017-04-30 ENCOUNTER — Encounter: Payer: Self-pay | Admitting: Adult Health

## 2017-05-11 ENCOUNTER — Ambulatory Visit (INDEPENDENT_AMBULATORY_CARE_PROVIDER_SITE_OTHER): Payer: BLUE CROSS/BLUE SHIELD | Admitting: Adult Health

## 2017-05-11 ENCOUNTER — Encounter: Payer: Self-pay | Admitting: Adult Health

## 2017-05-11 VITALS — BP 120/64 | Temp 98.3°F | Ht 70.5 in | Wt 206.0 lb

## 2017-05-11 DIAGNOSIS — Z23 Encounter for immunization: Secondary | ICD-10-CM

## 2017-05-11 DIAGNOSIS — R55 Syncope and collapse: Secondary | ICD-10-CM | POA: Diagnosis not present

## 2017-05-11 NOTE — Progress Notes (Signed)
Subjective:    Patient ID: Scott Vasquez, male    DOB: 04/27/1941, 76 y.o.   MRN: [id number]  HPI  76 year old male who  has a past medical history of Anemia; Diabetes mellitus; Diverticulosis of colon; Hypertension; Stroke [facility]); and Thyroid nodule. He presents to the office today with near syncopal episode. He reports that he was at work yesterday evening and around 6 pm, he started to feel light headed and dizzy, he became sweaty and felt as though he was going to pass out. He was able to place himself onto the ground before passing out. He denies LOC or hitting his head.   EMS was called but he refused to go to the ER. Per patient report his glucose was "90"  and his EKG was " normal". He reports that he did not eat or drink all day, and he did take all of his medications.   Today in the office he reports that he " feels fine, except for being a little fatigued."   Review of Systems See HPI   Past Medical History:  Diagnosis Date  . Anemia    iron deficiency  . Diabetes mellitus    type II  . Diverticulosis of colon   . Hypertension   . Stroke ([place])    numbness in left hand  . Thyroid nodule     Social History   Social History  . Marital status: Married    Spouse name: N/A  . Number of children: N/A  . Years of education: N/A   Occupational History  . retired Paediatric nurse   Social History Main Topics  . Smoking status: Former Smoker    Quit date: 08/10/1958  . Smokeless tobacco: Never Used  . Alcohol use No  . Drug use: No  . Sexual activity: Not on file   Other Topics Concern  . Not on file   Social History Narrative   Works at [employer] for 10 years/ works 5 days ; 40 hour week    - Married with two children who live locally   - No pets   - Attend church for fun.       Past Surgical History:  Procedure Laterality Date  . BACK SURGERY    . CATARACT EXTRACTION, BILATERAL    . SHOULDER SURGERY  08/2007   Supple  . THYROID LOBECTOMY Left 04/29/2015  .  THYROID LOBECTOMY Left 04/29/2015   Procedure: THYROID LOBECTOMY;  Surgeon: Armandina Gemma, MD;  Location: [facility];  Service: General;  Laterality: Left;    Family History  Problem Relation Age of Onset  . Diabetes Unknown   . Diabetes Father        Died from diabetic coma in 1970-10-27  . Hypertension Father   . Stroke Mother        Died in 63  . Hypertension Mother     No Known Allergies  Current Outpatient Prescriptions on File Prior to Visit  Medication Sig Dispense Refill  . amLODipine (NORVASC) 10 MG tablet Take 1 tablet (10 mg total) by mouth daily. 90 tablet 3  . atorvastatin (LIPITOR) 40 MG tablet Take 1 tablet (40 mg total) by mouth daily. 90 tablet 3  . Azelastine-Fluticasone 137-50 MCG/ACT SUSP Place 1 spray into the nose 2 (two) times daily. 23 g 3  . Blood Glucose Monitoring Suppl (ONE TOUCH ULTRA SYSTEM KIT) W/DEVICE KIT Use to check blood sugar twice a day 1 each 0  . clopidogrel (PLAVIX) 75  MG tablet TAKE ONE TABLET BY MOUTH DAILY. 90 tablet 3  . cyanocobalamin 100 MCG tablet Take 100 mcg by mouth daily.    Marland Kitchen donepezil (ARICEPT) 23 MG TABS tablet Take 1 tablet (23 mg total) by mouth at bedtime. 90 tablet 3  . glipiZIDE (GLUCOTROL) 5 MG tablet Take 2 tablets by mouth twice daily. 120 tablet 5  . insulin NPH-regular Human (HUMULIN 70/30) (70-30) 100 UNIT/ML injection INJECT 10 UNITS SUBCUTANEOUSLY TWICE DAILY (BEFORE BREAKFAST AND DINNER) 10 mL 3  . Insulin Syringe-Needle U-100 (INSULIN SYRINGE 1CC/31GX5/16") 31G X 5/16" 1 ML MISC Use two times a day as directed.     Marland Kitchen ipratropium (ATROVENT) 0.03 % nasal spray Place 2 sprays into both nostrils every 12 (twelve) hours. 30 mL 12  . levocetirizine (XYZAL) 5 MG tablet TAKE ONE TABLET BY MOUTH EVERY EVENING. 30 tablet 5  . levothyroxine (SYNTHROID, LEVOTHROID) 50 MCG tablet TAKE ONE TABLET BY MOUTH ONCE DAILY 90 tablet 1  . lisinopril (PRINIVIL,ZESTRIL) 40 MG tablet Take 1 tablet (40 mg total) by mouth daily. 90 tablet 3  . meloxicam  (MOBIC) 7.5 MG tablet TAKE ONE TABLET BY MOUTH ONCE DAILY. 30 tablet 0  . metFORMIN (GLUCOPHAGE) 1000 MG tablet Take 1 tablet (1,000 mg total) by mouth 2 (two) times daily with a meal. 180 tablet 3  . metFORMIN (GLUCOPHAGE) 500 MG tablet TAKE TWO (2) TABLETS BY MOUTH TWICE DAILY. 360 tablet 1  . ONE TOUCH ULTRA TEST test strip Check blood sugar twice daily. 100 each 5  . ONETOUCH DELICA LANCETS 36R MISC 1 each by Other route See admin instructions. Check blood sugar twice daily  0  . tamsulosin (FLOMAX) 0.4 MG CAPS capsule TAKE ONE CAPSULE BY MOUTH DAILY. 30 capsule 2  . TRADJENTA 5 MG TABS tablet TAKE ONE TABLET BY MOUTH DAILY 30 tablet 4   No current facility-administered medications on file prior to visit.     BP 120/64 (BP Location: Left Arm, Patient Position: Sitting, Cuff Size: Large)   Temp 98.3 F (36.8 C) (Oral)   Ht 5' 10.5" (1.791 m)   Wt 206 lb (93.4 kg)   BMI 29.14 kg/m       Objective:   Physical Exam  Constitutional: He is oriented to person, place, and time. He appears well-developed and well-nourished. No distress.  Neck: Carotid bruit is not present.  Cardiovascular: Normal rate, regular rhythm, normal heart sounds and intact distal pulses.  Exam reveals no gallop and no friction rub.   No murmur heard. Pulmonary/Chest: Effort normal and breath sounds normal. No respiratory distress. He has no wheezes. He has no rales. He exhibits no tenderness.  Musculoskeletal: Normal range of motion. He exhibits no edema, tenderness or deformity.  Neurological: He is alert and oriented to person, place, and time. He has normal reflexes. He displays normal reflexes. No cranial nerve deficit. He exhibits normal muscle tone. Coordination normal.  Skin: Skin is warm and dry. No rash noted. He is not diaphoretic. No erythema. No pallor.  Surgical scar on abdomen   Psychiatric: He has a normal mood and affect. His behavior is normal. Judgment and thought content normal.  Vitals  reviewed.     Assessment & Plan:  1. Near syncope - Probable hypoglycemic event. I am not convinced that his blood sugar was 90, when he was seen by EMS. Reinforced how important it is to eat throughout the day. Will check basic labs.  - Can get glucose tabs OTC and keep  on person  - EKG 12-Lead- Normal Sinus Rhythm - rate 64  - Hemoglobin A1c - CBC with Differential/Platelet - Basic metabolic panel; Future - TSH - follow up as needed   [name], NP

## 2017-05-11 NOTE — Patient Instructions (Signed)
It was great seeing you today   I will follow up with you regarding your lab work   Please call [facility]   Phone: [phone]

## 2017-05-11 NOTE — Addendum Note (Signed)
Addended by: Tomi Likens on: 05/11/2017 05:01 PM   Modules accepted: Orders

## 2017-05-12 LAB — TSH: TSH: 2.74 u[IU]/mL (ref 0.35–4.50)

## 2017-05-12 LAB — CBC WITH DIFFERENTIAL/PLATELET
BASOS ABS: 0 10*3/uL (ref 0.0–0.1)
Basophils Relative: 0.4 % (ref 0.0–3.0)
EOS ABS: 0.1 10*3/uL (ref 0.0–0.7)
Eosinophils Relative: 1.6 % (ref 0.0–5.0)
HCT: 35.4 % — ABNORMAL LOW (ref 39.0–52.0)
Hemoglobin: 11.3 g/dL — ABNORMAL LOW (ref 13.0–17.0)
LYMPHS ABS: 1.3 10*3/uL (ref 0.7–4.0)
Lymphocytes Relative: 16.8 % (ref 12.0–46.0)
MCHC: 31.9 g/dL (ref 30.0–36.0)
MCV: 87.7 fl (ref 78.0–100.0)
MONOS PCT: 8.2 % (ref 3.0–12.0)
Monocytes Absolute: 0.6 10*3/uL (ref 0.1–1.0)
NEUTROS ABS: 5.7 10*3/uL (ref 1.4–7.7)
NEUTROS PCT: 73 % (ref 43.0–77.0)
PLATELETS: 312 10*3/uL (ref 150.0–400.0)
RBC: 4.04 Mil/uL — AB (ref 4.22–5.81)
RDW: 13.9 % (ref 11.5–15.5)
WBC: 7.8 10*3/uL (ref 4.0–10.5)

## 2017-05-12 LAB — BASIC METABOLIC PANEL
BUN: 21 mg/dL (ref 6–23)
CHLORIDE: 103 meq/L (ref 96–112)
CO2: 27 mEq/L (ref 19–32)
CREATININE: 1.49 mg/dL (ref 0.40–1.50)
Calcium: 9.8 mg/dL (ref 8.4–10.5)
GFR: 58.66 mL/min — ABNORMAL LOW (ref >60.00)
Glucose, Bld: 133 mg/dL — ABNORMAL HIGH (ref 70–99)
POTASSIUM: 4.3 meq/L (ref 3.5–5.1)
SODIUM: 139 meq/L (ref 135–145)

## 2017-05-12 LAB — HEMOGLOBIN A1C: HEMOGLOBIN A1C: 9.4 % — AB (ref 4.6–6.5)

## 2017-05-13 ENCOUNTER — Other Ambulatory Visit: Payer: Self-pay | Admitting: Adult Health

## 2017-05-13 ENCOUNTER — Telehealth: Payer: Self-pay | Admitting: *Deleted

## 2017-05-13 NOTE — Telephone Encounter (Signed)
Patient is requesting something to help him sleep and to help him with cramps. Please advise

## 2017-05-14 ENCOUNTER — Other Ambulatory Visit: Payer: Self-pay | Admitting: Family Medicine

## 2017-05-14 MED ORDER — DULAGLUTIDE 0.75 MG/0.5ML ~~LOC~~ SOAJ: SUBCUTANEOUS | 0 refills | Status: DC

## 2017-05-24 ENCOUNTER — Other Ambulatory Visit: Payer: Self-pay | Admitting: Adult Health

## 2017-05-26 NOTE — Telephone Encounter (Signed)
Sent to the pharmacy by e-scribe.  Pt due back 08/2017 for diabetes check.

## 2017-05-31 ENCOUNTER — Telehealth: Payer: Self-pay | Admitting: Adult Health

## 2017-05-31 NOTE — Telephone Encounter (Signed)
Pt mess up 2 vial of trulicity and now needs samples. Pt is unable to get trulicity refill until thursday

## 2017-05-31 NOTE — Telephone Encounter (Signed)
Sample available for pick up and Left message on machine

## 2017-06-04 ENCOUNTER — Ambulatory Visit (INDEPENDENT_AMBULATORY_CARE_PROVIDER_SITE_OTHER): Payer: BLUE CROSS/BLUE SHIELD | Admitting: Adult Health

## 2017-06-04 ENCOUNTER — Ambulatory Visit (INDEPENDENT_AMBULATORY_CARE_PROVIDER_SITE_OTHER)
Admission: RE | Admit: 2017-06-04 | Discharge: 2017-06-04 | Disposition: A | Discharge status: Home / Self Care | Payer: BLUE CROSS/BLUE SHIELD | Source: Ambulatory Visit | Attending: Adult Health | Admitting: Adult Health

## 2017-06-04 ENCOUNTER — Encounter: Payer: Self-pay | Admitting: Adult Health

## 2017-06-04 VITALS — BP 140/64 | Temp 97.6°F | Ht 70.5 in | Wt 211.0 lb

## 2017-06-04 DIAGNOSIS — E118 Type 2 diabetes mellitus with unspecified complications: Secondary | ICD-10-CM

## 2017-06-04 DIAGNOSIS — J309 Allergic rhinitis, unspecified: Secondary | ICD-10-CM | POA: Diagnosis not present

## 2017-06-04 DIAGNOSIS — E1165 Type 2 diabetes mellitus with hyperglycemia: Secondary | ICD-10-CM

## 2017-06-04 DIAGNOSIS — R0602 Shortness of breath: Secondary | ICD-10-CM | POA: Diagnosis not present

## 2017-06-04 DIAGNOSIS — IMO0002 Reserved for concepts with insufficient information to code with codable children: Secondary | ICD-10-CM

## 2017-06-04 NOTE — Progress Notes (Signed)
Subjective:    Patient ID: Scott Vasquez, male    DOB: May 11, 1941, 76 y.o.   MRN: [id number]  HPI 76 year old male who  has a past medical history of Anemia; Diabetes mellitus; Diverticulosis of colon; Hypertension; Stroke [name]); and Thyroid nodule. He presents to the office today for follow up regarding diabetes.   His current regimen includes that of Metformin 1000 mg BID, Glipizide 5 mg, Tradjenta 5 mg, and was placed on Trulicity 9.93 during the last visit   Today he reports that his blood sugars were elevated because he was not taking his metformin as he was out of it and never informed the pharmacy or myself. He then restarted the metformin but was taking 4 tablets of the 1000 mg tabs, this caused him to have diarrhea. He has since gone back to the 1000 mg BID and his diarrhea has resolved. He reports that he is feeling better sine starting Trulicity and that his blood sugars have come down into the 130-160's.   Today he complains of shortness of breath with rest that he feels has been becoming worse. He does not feel short of breath every day, but he has found that it happens more often when he is sitting or laying down . Denies any fevers or feeling acutely ill.   Last echo in 06-Oct-2014 was normal with an EF of 60- 65%  Review of Systems See HPI   Past Medical History:  Diagnosis Date  . Anemia    iron deficiency  . Diabetes mellitus    type II  . Diverticulosis of colon   . Hypertension   . Stroke ([place])    numbness in left hand  . Thyroid nodule     Social History   Social History  . Marital status: Married    Spouse name: N/A  . Number of children: N/A  . Years of education: N/A   Occupational History  . retired Paediatric nurse   Social History Main Topics  . Smoking status: Former Smoker    Quit date: 08/10/1958  . Smokeless tobacco: Never Used  . Alcohol use No  . Drug use: No  . Sexual activity: Not on file   Other Topics Concern  . Not on file   Social History  Narrative   Works at [employer] for 10 years/ works 5 days ; 40 hour week    - Married with two children who live locally   - No pets   - Attend church for fun.       Past Surgical History:  Procedure Laterality Date  . BACK SURGERY    . CATARACT EXTRACTION, BILATERAL    . SHOULDER SURGERY  08/2007   Supple  . THYROID LOBECTOMY Left 04/29/2015  . THYROID LOBECTOMY Left 04/29/2015   Procedure: THYROID LOBECTOMY;  Surgeon: Armandina Gemma, MD;  Location: [facility] OR;  Service: General;  Laterality: Left;    Family History  Problem Relation Age of Onset  . Diabetes Unknown   . Diabetes Father        Died from diabetic coma in 10-06-70  . Hypertension Father   . Stroke Mother        Died in 57  . Hypertension Mother     No Known Allergies  Current Outpatient Prescriptions on File Prior to Visit  Medication Sig Dispense Refill  . amLODipine (NORVASC) 10 MG tablet Take 1 tablet (10 mg total) by mouth daily. 90 tablet 3  . atorvastatin (LIPITOR) 40  MG tablet Take 1 tablet (40 mg total) by mouth daily. 90 tablet 3  . Azelastine-Fluticasone 137-50 MCG/ACT SUSP Place 1 spray into the nose 2 (two) times daily. 23 g 3  . Blood Glucose Monitoring Suppl (ONE TOUCH ULTRA SYSTEM KIT) W/DEVICE KIT Use to check blood sugar twice a day 1 each 0  . clopidogrel (PLAVIX) 75 MG tablet TAKE ONE TABLET BY MOUTH DAILY. 90 tablet 3  . cyanocobalamin 100 MCG tablet Take 100 mcg by mouth daily.    Marland Kitchen donepezil (ARICEPT) 23 MG TABS tablet Take 1 tablet (23 mg total) by mouth at bedtime. 90 tablet 3  . glipiZIDE (GLUCOTROL) 5 MG tablet Take 2 tablets by mouth twice daily. 120 tablet 5  . insulin NPH-regular Human (HUMULIN 70/30) (70-30) 100 UNIT/ML injection INJECT 10 UNITS SUBCUTANEOUSLY TWICE DAILY (BEFORE BREAKFAST AND DINNER) 10 mL 3  . Insulin Syringe-Needle U-100 (INSULIN SYRINGE 1CC/31GX5/16") 31G X 5/16" 1 ML MISC Use two times a day as directed.     Marland Kitchen ipratropium (ATROVENT) 0.03 % nasal spray Place 2 sprays into  both nostrils every 12 (twelve) hours. 30 mL 12  . levocetirizine (XYZAL) 5 MG tablet TAKE ONE TABLET BY MOUTH EVERY EVENING. 30 tablet 5  . levothyroxine (SYNTHROID, LEVOTHROID) 50 MCG tablet TAKE ONE TABLET BY MOUTH ONCE DAILY 90 tablet 1  . lisinopril (PRINIVIL,ZESTRIL) 40 MG tablet Take 1 tablet (40 mg total) by mouth daily. 90 tablet 3  . meloxicam (MOBIC) 7.5 MG tablet TAKE ONE TABLET BY MOUTH ONCE DAILY. 30 tablet 0  . metFORMIN (GLUCOPHAGE) 1000 MG tablet Take 1 tablet (1,000 mg total) by mouth 2 (two) times daily with a meal. 180 tablet 3  . ONE TOUCH ULTRA TEST test strip Check blood sugar twice daily. 100 each 5  . ONETOUCH DELICA LANCETS 53G MISC 1 each by Other route See admin instructions. Check blood sugar twice daily  0  . tamsulosin (FLOMAX) 0.4 MG CAPS capsule TAKE ONE CAPSULE BY MOUTH DAILY. 30 capsule 2  . TRADJENTA 5 MG TABS tablet TAKE ONE TABLET BY MOUTH DAILY 30 tablet 4  . TRULICITY 6.44 IH/4.7QQ SOPN INJECT 1 DOSE SUBCUTANEOUSLY ONCE A WEEK 12 pen 0   No current facility-administered medications on file prior to visit.     BP 140/64   Temp 97.6 F (36.4 C) (Oral)   Ht 5' 10.5" (1.791 m)   Wt 211 lb (95.7 kg)   BMI 29.85 kg/m       Objective:   Physical Exam  Constitutional: He is oriented to person, place, and time. He appears well-developed and well-nourished. No distress.  Cardiovascular: Normal rate, regular rhythm, normal heart sounds and intact distal pulses.  Exam reveals no gallop and no friction rub.   No murmur heard. Pulmonary/Chest: Effort normal and breath sounds normal. No respiratory distress. He has no wheezes. He has no rales. He exhibits no tenderness.  Neurological: He is alert and oriented to person, place, and time.  Skin: Skin is warm and dry. No rash noted. He is not diaphoretic. No erythema. No pallor.  Psychiatric: He has a normal mood and affect. His behavior is normal. Judgment and thought content normal.  Nursing note and vitals  reviewed.     Assessment & Plan:  1. Diabetes mellitus type 2 with complications, uncontrolled ([place]) - Continue with current dosing  - Will do A1c in Jan 2019   2. Shortness of breath - Weight loss would help.  - DG Chest 2  View; Future - Consider repeat echo   [name], NP

## 2017-06-11 ENCOUNTER — Other Ambulatory Visit: Payer: Self-pay | Admitting: Family Medicine

## 2017-06-11 DIAGNOSIS — R0602 Shortness of breath: Secondary | ICD-10-CM

## 2017-06-11 MED ORDER — ALBUTEROL SULFATE HFA 108 (90 BASE) MCG/ACT IN AERS: 2.0000 | INHALATION_SPRAY | Freq: Four times a day (QID) | RESPIRATORY_TRACT | 2 refills | Status: DC | PRN

## 2017-06-14 ENCOUNTER — Other Ambulatory Visit: Payer: Self-pay | Admitting: Adult Health

## 2017-06-15 ENCOUNTER — Encounter: Payer: Self-pay | Admitting: Adult Health

## 2017-06-15 ENCOUNTER — Ambulatory Visit (INDEPENDENT_AMBULATORY_CARE_PROVIDER_SITE_OTHER): Payer: BLUE CROSS/BLUE SHIELD | Admitting: Adult Health

## 2017-06-15 VITALS — BP 120/60 | Temp 98.2°F | Ht 70.5 in | Wt 208.0 lb

## 2017-06-15 DIAGNOSIS — E1165 Type 2 diabetes mellitus with hyperglycemia: Secondary | ICD-10-CM | POA: Diagnosis not present

## 2017-06-15 DIAGNOSIS — E118 Type 2 diabetes mellitus with unspecified complications: Secondary | ICD-10-CM | POA: Diagnosis not present

## 2017-06-15 DIAGNOSIS — IMO0002 Reserved for concepts with insufficient information to code with codable children: Secondary | ICD-10-CM

## 2017-06-15 DIAGNOSIS — R42 Dizziness and giddiness: Secondary | ICD-10-CM | POA: Diagnosis not present

## 2017-06-15 LAB — GLUCOSE, POCT (MANUAL RESULT ENTRY): POC Glucose: 149 mg/dl — AB (ref 70–99)

## 2017-06-15 NOTE — Progress Notes (Signed)
Subjective:    Patient ID: Scott Vasquez, male    DOB: November 19, 1940, 76 y.o.   MRN: [id number]  HPI  76 year old male who  has a past medical history of Anemia, Diabetes mellitus, Diverticulosis of colon, Hypertension, Stroke ([place]), and Thyroid nodule. He presents to the office today for dizziness. He reports that yesterday while he was at work he became dizzy and felt as though his vision was " going in and out", this lasted roughly one minute.Marland Kitchen He was sitting when this happened. He checked his blood sugar while this was happening reports that his blood sugar was 230.   He feels as though this is the same type of episode that happened on Oct   He denies any chest pain or shortness of breath during this episode. There was no LOC.   He denies any other incidents.   Review of Systems See HPI   Past Medical History:  Diagnosis Date  . Anemia    iron deficiency  . Diabetes mellitus    type II  . Diverticulosis of colon   . Hypertension   . Stroke ([place])    numbness in left hand  . Thyroid nodule     Social History   Socioeconomic History  . Marital status: Married    Spouse name: Not on file  . Number of children: Not on file  . Years of education: Not on file  . Highest education level: Not on file  Social Needs  . Financial resource strain: Not on file  . Food insecurity - worry: Not on file  . Food insecurity - inability: Not on file  . Transportation needs - medical: Not on file  . Transportation needs - non-medical: Not on file  Occupational History  . Occupation: retired    Fish farm manager: [employer]  . Smoking status: Former Smoker    Last attempt to quit: 08/10/1958    Years since quitting: 58.8  . Smokeless tobacco: Never Used  Substance and Sexual Activity  . Alcohol use: No  . Drug use: No  . Sexual activity: Not on file  Other Topics Concern  . Not on file  Social History Narrative   Works at [employer] for 10 years/ works 5 days ; 40 hour week    -  Married with two children who live locally   - No pets   - Attend church for fun.    Past Surgical History:  Procedure Laterality Date  . BACK SURGERY    . CATARACT EXTRACTION, BILATERAL    . SHOULDER SURGERY  10/01/07   Supple  . THYROID LOBECTOMY Left 04/29/2015    Family History  Problem Relation Age of Onset  . Diabetes Unknown   . Diabetes Father        Died from diabetic coma in September 30, 1970  . Hypertension Father   . Stroke Mother        Died in 85  . Hypertension Mother     No Known Allergies  Current Outpatient Medications on File Prior to Visit  Medication Sig Dispense Refill  . albuterol (PROAIR HFA) 108 (90 Base) MCG/ACT inhaler Inhale 2 puffs into the lungs every 6 (six) hours as needed for wheezing or shortness of breath. 1 Inhaler 2  . amLODipine (NORVASC) 10 MG tablet Take 1 tablet (10 mg total) by mouth daily. 90 tablet 3  . atorvastatin (LIPITOR) 40 MG tablet Take 1 tablet (40 mg total) by mouth daily. 90 tablet  3  . Azelastine-Fluticasone 137-50 MCG/ACT SUSP Place 1 spray into the nose 2 (two) times daily. 23 g 3  . Blood Glucose Monitoring Suppl (ONE TOUCH ULTRA SYSTEM KIT) W/DEVICE KIT Use to check blood sugar twice a day 1 each 0  . clopidogrel (PLAVIX) 75 MG tablet TAKE ONE TABLET BY MOUTH DAILY. 90 tablet 3  . cyanocobalamin 100 MCG tablet Take 100 mcg by mouth daily.    Marland Kitchen donepezil (ARICEPT) 23 MG TABS tablet Take 1 tablet (23 mg total) by mouth at bedtime. 90 tablet 3  . glipiZIDE (GLUCOTROL) 5 MG tablet Take 2 tablets by mouth twice daily. 120 tablet 5  . glucose blood (ONE TOUCH ULTRA TEST) test strip USE TO CHECK BLOOD SUGAR TWICE DAILY 100 each 3  . insulin NPH-regular Human (HUMULIN 70/30) (70-30) 100 UNIT/ML injection INJECT 10 UNITS SUBCUTANEOUSLY TWICE DAILY (BEFORE BREAKFAST AND DINNER) 10 mL 3  . Insulin Syringe-Needle U-100 (INSULIN SYRINGE 1CC/31GX5/16") 31G X 5/16" 1 ML MISC Use two times a day as directed.     Marland Kitchen ipratropium (ATROVENT) 0.03 %  nasal spray Place 2 sprays into both nostrils every 12 (twelve) hours. 30 mL 12  . levocetirizine (XYZAL) 5 MG tablet TAKE ONE TABLET BY MOUTH EVERY EVENING. 30 tablet 5  . levothyroxine (SYNTHROID, LEVOTHROID) 50 MCG tablet TAKE ONE TABLET BY MOUTH ONCE DAILY 90 tablet 1  . lisinopril (PRINIVIL,ZESTRIL) 40 MG tablet Take 1 tablet (40 mg total) by mouth daily. 90 tablet 3  . meloxicam (MOBIC) 7.5 MG tablet TAKE ONE TABLET BY MOUTH ONCE DAILY. 30 tablet 0  . metFORMIN (GLUCOPHAGE) 1000 MG tablet Take 1 tablet (1,000 mg total) by mouth 2 (two) times daily with a meal. 180 tablet 3  . ONETOUCH DELICA LANCETS 62M MISC 1 each by Other route See admin instructions. Check blood sugar twice daily  0  . tamsulosin (FLOMAX) 0.4 MG CAPS capsule TAKE ONE CAPSULE BY MOUTH DAILY. 30 capsule 2  . TRADJENTA 5 MG TABS tablet TAKE ONE TABLET BY MOUTH DAILY 30 tablet 4  . TRULICITY 3.55 HR/4.1UL SOPN INJECT 1 DOSE SUBCUTANEOUSLY ONCE A WEEK 12 pen 0   No current facility-administered medications on file prior to visit.     BP 120/60 (BP Location: Left Arm)   Temp 98.2 F (36.8 C) (Oral)   Ht 5' 10.5" (1.791 m)   Wt 208 lb (94.3 kg)   BMI 29.42 kg/m       Objective:   Physical Exam  Constitutional: He is oriented to person, place, and time. He appears well-developed and well-nourished. No distress.  Eyes: Conjunctivae and EOM are normal. Pupils are equal, round, and reactive to light. Right eye exhibits no discharge. Left eye exhibits no discharge. No scleral icterus.  Cardiovascular: Normal rate, regular rhythm, normal heart sounds and intact distal pulses. Exam reveals no gallop.  No murmur heard. Pulmonary/Chest: Effort normal and breath sounds normal. No respiratory distress. He has no wheezes. He has no rales. He exhibits no tenderness.  Neurological: He is alert and oriented to person, place, and time.  Skin: Skin is warm and dry. No rash noted. He is not diaphoretic. No erythema. No pallor.    Psychiatric: He has a normal mood and affect. His behavior is normal. Thought content normal.  Vitals reviewed.     Assessment & Plan:  1. Dizzinesses - Unsure of what is causing his dizziness and blurred vision. Will get [place] of carotids. I am going to have him cut  back on Norvasc from 10 mg to 5 mg  - POC Glucose (CBG)- 149  - VAS US CAROTID; Future - Follow up in two weeks or sooner if needed - Consider imaging of head   [name], NP

## 2017-06-15 NOTE — Patient Instructions (Addendum)
Please cut the Norvasc in half from 10 mg to 5 mg   Someone will call you to schedule your ultrasound of the carotid arteries   Please follow up with me in 2 weeks or sooner if needed

## 2017-06-30 ENCOUNTER — Other Ambulatory Visit: Payer: Self-pay | Admitting: Adult Health

## 2017-06-30 NOTE — Telephone Encounter (Signed)
Sent to the pharmacy by e-scribe. 

## 2017-07-09 ENCOUNTER — Encounter: Payer: Self-pay | Admitting: Adult Health

## 2017-07-09 ENCOUNTER — Ambulatory Visit (INDEPENDENT_AMBULATORY_CARE_PROVIDER_SITE_OTHER): Payer: BLUE CROSS/BLUE SHIELD | Admitting: Internal Medicine

## 2017-07-09 ENCOUNTER — Ambulatory Visit (INDEPENDENT_AMBULATORY_CARE_PROVIDER_SITE_OTHER): Payer: BLUE CROSS/BLUE SHIELD | Admitting: Adult Health

## 2017-07-09 ENCOUNTER — Encounter: Payer: Self-pay | Admitting: Internal Medicine

## 2017-07-09 VITALS — BP 120/64 | HR 91 | Ht 70.0 in | Wt 210.0 lb

## 2017-07-09 VITALS — BP 104/56 | Temp 98.4°F | Wt 209.0 lb

## 2017-07-09 DIAGNOSIS — R0602 Shortness of breath: Secondary | ICD-10-CM

## 2017-07-09 DIAGNOSIS — I519 Heart disease, unspecified: Secondary | ICD-10-CM | POA: Diagnosis not present

## 2017-07-09 DIAGNOSIS — I10 Essential (primary) hypertension: Secondary | ICD-10-CM

## 2017-07-09 DIAGNOSIS — R0989 Other specified symptoms and signs involving the circulatory and respiratory systems: Secondary | ICD-10-CM | POA: Diagnosis not present

## 2017-07-09 DIAGNOSIS — Z87891 Personal history of nicotine dependence: Secondary | ICD-10-CM | POA: Diagnosis not present

## 2017-07-09 DIAGNOSIS — W890XXD Exposure to welding light (arc), subsequent encounter: Secondary | ICD-10-CM

## 2017-07-09 DIAGNOSIS — I5189 Other ill-defined heart diseases: Secondary | ICD-10-CM

## 2017-07-09 DIAGNOSIS — J986 Disorders of diaphragm: Secondary | ICD-10-CM | POA: Diagnosis not present

## 2017-07-09 DIAGNOSIS — R0601 Orthopnea: Secondary | ICD-10-CM | POA: Diagnosis not present

## 2017-07-09 NOTE — Progress Notes (Signed)
Subjective:    Patient ID: Scott Vasquez, male    DOB: 10-Feb-1941, 76 y.o.   MRN: [id number]  HPI  76 year old male who  has a past medical history of Anemia, Diabetes mellitus, Diverticulosis of colon, Hypertension, Stroke ([place]), and Thyroid nodule. He presents to the office today for two week follow up regarding dizziness. During the last visit I had him cut his norvasc dose from 10 mg to 5 mg as it sounded as though he was experiencing dizziness from orthostatic hypotension.   Today, he reports that he has not experienced any dizziness since the last visit. He has not been checking his blood pressures at home.  Overall, he is feeling pretty well and has no acute complaints.   BP at Pulmonary today was 120/64 and in the office today his BP is 104/56   Review of Systems Past Medical History:  Diagnosis Date  . Anemia    iron deficiency  . Diabetes mellitus    type II  . Diverticulosis of colon   . Hypertension   . Stroke ([place])    numbness in left hand  . Thyroid nodule     Social History   Socioeconomic History  . Marital status: Married    Spouse name: Not on file  . Number of children: Not on file  . Years of education: Not on file  . Highest education level: Not on file  Social Needs  . Financial resource strain: Not on file  . Food insecurity - worry: Not on file  . Food insecurity - inability: Not on file  . Transportation needs - medical: Not on file  . Transportation needs - non-medical: Not on file  Occupational History  . 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 Smoking status: Former Smoker    Packs/day: 0.50    Years: 20.00    Pack years: 10.00    Types: Cigarettes    Start date: Oct 13, 1951    Last attempt to quit: 08/10/1958    Years since quitting: 58.9  . Smokeless tobacco: Never Used  Substance and Sexual Activity  . Alcohol use: No  . Drug use: No  . Sexual activity: Not on file  Other Topics Concern  . Not on file  Social  History Narrative   Works at [employer] for 10 years/ works 5 days ; 40 hour week    - Married with two children who live locally   - No pets   - Attend church for fun.    Past Surgical History:  Procedure Laterality Date  . BACK SURGERY    . CATARACT EXTRACTION, BILATERAL    . SHOULDER SURGERY  08/2007   Supple  . THYROID LOBECTOMY Left 04/29/2015  . THYROID LOBECTOMY Left 04/29/2015   Procedure: THYROID LOBECTOMY;  Surgeon: Armandina Gemma, MD;  Location: [facility] OR;  Service: General;  Laterality: Left;    Family History  Problem Relation Age of Onset  . Diabetes Unknown   . Diabetes Father        Died from diabetic coma in October 12, 1970  . Hypertension Father   . Stroke Mother        Died in 82  . Hypertension Mother     No Known Allergies  Current Outpatient Medications on File Prior to Visit  Medication Sig Dispense Refill  . albuterol (PROAIR HFA) 108 (90 Base) MCG/ACT inhaler Inhale 2 puffs into the lungs every 6 (six) hours as needed for  wheezing or shortness of breath. 1 Inhaler 2  . amLODipine (NORVASC) 10 MG tablet Take 1 tablet (10 mg total) by mouth daily. 90 tablet 3  . atorvastatin (LIPITOR) 40 MG tablet Take 1 tablet (40 mg total) by mouth daily. 90 tablet 3  . Azelastine-Fluticasone 137-50 MCG/ACT SUSP Place 1 spray into the nose 2 (two) times daily. 23 g 3  . Blood Glucose Monitoring Suppl (ONE TOUCH ULTRA SYSTEM KIT) W/DEVICE KIT Use to check blood sugar twice a day 1 each 0  . clopidogrel (PLAVIX) 75 MG tablet TAKE ONE TABLET BY MOUTH DAILY. 90 tablet 3  . cyanocobalamin 100 MCG tablet Take 100 mcg by mouth daily.    Marland Kitchen donepezil (ARICEPT) 23 MG TABS tablet Take 1 tablet (23 mg total) by mouth at bedtime. 90 tablet 3  . glipiZIDE (GLUCOTROL) 5 MG tablet Take 2 tablets by mouth twice daily. 120 tablet 5  . glucose blood (ONE TOUCH ULTRA TEST) test strip USE TO CHECK BLOOD SUGAR TWICE DAILY 100 each 3  . ipratropium (ATROVENT) 0.03 % nasal spray Place 2 sprays into both  nostrils every 12 (twelve) hours. 30 mL 12  . levocetirizine (XYZAL) 5 MG tablet TAKE ONE TABLET BY MOUTH EVERY EVENING. 30 tablet 5  . levothyroxine (SYNTHROID, LEVOTHROID) 50 MCG tablet TAKE ONE TABLET BY MOUTH ONCE DAILY 90 tablet 1  . lisinopril (PRINIVIL,ZESTRIL) 40 MG tablet Take 1 tablet (40 mg total) by mouth daily. 90 tablet 3  . meloxicam (MOBIC) 7.5 MG tablet TAKE ONE TABLET BY MOUTH ONCE DAILY. 30 tablet 0  . metFORMIN (GLUCOPHAGE) 500 MG tablet TAKE TWO (2) TABLETS BY MOUTH TWICE DAILY. 360 tablet 0  . ONETOUCH DELICA LANCETS 16X MISC 1 each by Other route See admin instructions. Check blood sugar twice daily  0  . tamsulosin (FLOMAX) 0.4 MG CAPS capsule TAKE ONE CAPSULE BY MOUTH DAILY. 30 capsule 2  . TRADJENTA 5 MG TABS tablet TAKE ONE TABLET BY MOUTH DAILY 30 tablet 4  . TRULICITY 0.96 EA/5.4UJ SOPN INJECT 1 DOSE SUBCUTANEOUSLY ONCE A WEEK 12 pen 0   No current facility-administered medications on file prior to visit.     BP (!) 104/56 (BP Location: Left Arm, Cuff Size: Large)   Temp 98.4 F (36.9 C) (Oral)   Wt 209 lb (94.8 kg)   BMI 29.99 kg/m       Objective:   Physical Exam  Constitutional: He is oriented to person, place, and time. He appears well-developed and well-nourished. No distress.  Cardiovascular: Normal rate, regular rhythm, normal heart sounds and intact distal pulses. Exam reveals no gallop and no friction rub.  No murmur heard. Pulmonary/Chest: Effort normal and breath sounds normal. No respiratory distress. He has no wheezes. He has no rales.  Neurological: He is alert and oriented to person, place, and time.  Skin: Skin is warm and dry. No rash noted. He is not diaphoretic. No erythema. No pallor.  Psychiatric: He has a normal mood and affect. His behavior is normal. Judgment and thought content normal.  Nursing note and vitals reviewed.     Assessment & Plan:  1. Essential hypertension - I am going to d/c norvasc completely as he does not need  the extra coverage at this time  - Follow up in January for A1c check and I will reevaluate at this time  - Follow up as needed  [name], NP

## 2017-07-09 NOTE — Patient Instructions (Addendum)
ICD-10-CM   1. Shortness of breath R06.02   2. Orthopnea R06.01   3. 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 Lung crackles R09.89   5. Exposure to welding arc, subsequent encounter W89.0XXD   6. Smoking history Z87.891   7. Diastolic dysfunction J09.6     Your symptoms could be related to a number of causes from #3 to  #7 or some combination of these Need to rule out ILD or pulmonary fibrosis  Plan Do a sniff test High-resolution CT scan of the chest supine and prone  do full pulmonary function test  Follow-up -Return to see my nurse try to show myself after the above. - go to ER if needed

## 2017-07-09 NOTE — Progress Notes (Signed)
Subjective:    Patient ID: Scott Vasquez, male    DOB: 27-Jan-1941, 76 y.o.   MRN: [id number] PCP [name], NP  HPI  IOV 07/09/2017  Chief Complaint  Patient presents with  . Advice Only    Referred by [facility] for SOB. States that he is SOB all the time which is worse at bedtime.  Denies any cough or CP.   76 year old male.  History provided by patient and some from his wife and review of the charts.  He reports insidious onset of dyspnea 7 or 8 months ago.  At present only when he lies down and when he gets up in the morning.  He does not have orthopnea during his sleep.  No proximal nocturnal dyspnea.  He denies any cough although his wife thinks he has mild amount of cough.  They both categorically denies any dyspnea on exertion.  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  There is no associated chest pain or edema or wheezing or hemoptysis or chills or weight loss. Walking desaturation test with a forehead probe 185 feet x3 laps on room air: Resting heart rate 95/min  Final heart rate 127/min.  Resting pulse ox 98%.  Final pulse ox 96%.  Basically she got tachycardic without desaturating.   Review of his recent labs include -2014-10-31 echocardiogram with grade 2 diastolic dysfunction but normal systolic function -EKG in October 2018 with normal sinus rhythm without tachycardia [he is tachycardic today] -Chest x-ray October 2018 with possible left diaphragm elevation otherwise clear lung fields.  Personally visualized - blood labs October 2018 with a creatinine 1.5 mg percent and a hemoglobin 11 g%. - feno 07/09/2017 - > 14 ppb and normal - allergy work up 3 weeks ago per hx - normal (dr Donneta Romberg)   has a past medical history of Anemia, Diabetes mellitus, Diverticulosis of colon,  Hypertension, Stroke ([place]), and Thyroid nodule.   reports that he quit smoking about 58 years ago. he has never used smokeless tobacco. 5pack smoking hix  Past Surgical History:  Procedure Laterality Date  . BACK SURGERY    . CATARACT EXTRACTION, BILATERAL    . SHOULDER SURGERY  08/2007   Supple  . THYROID LOBECTOMY Left 04/29/2015  . THYROID LOBECTOMY Left 04/29/2015   Procedure: THYROID LOBECTOMY;  Surgeon: Armandina Gemma, MD;  Location: [facility];  Service: General;  Laterality: Left;    No Known Allergies  Immunization History  Administered Date(s) Administered  . Influenza Split 07/13/2011, 08/19/2012  . Influenza Whole 08/15/2004, 06/10/2007, 05/17/2008, 05/03/2009, 05/02/2010  . Influenza, High Dose Seasonal PF 06/07/2015, 05/29/2016, 05/11/2017  . Influenza,inj,Quad PF,6+ Mos 05/26/2013, 04/20/2014  . Pneumococcal Conjugate-13 11/26/2014  . Pneumococcal Polysaccharide-23 01/16/2011  . Td 08/10/2004  . Tdap 11/26/2014    Family History  Problem Relation Age of Onset  . Diabetes Unknown   . Diabetes Father        Died from diabetic coma in 10/31/1970  . Hypertension Father   . Stroke Mother        Died in 10  . Hypertension Mother      Current Outpatient Medications:  .  albuterol (PROAIR HFA) 108 (90 Base) MCG/ACT inhaler, Inhale 2 puffs into the lungs every 6 (six) hours  as needed for wheezing or shortness of breath., Disp: 1 Inhaler, Rfl: 2 .  amLODipine (NORVASC) 10 MG tablet, Take 1 tablet (10 mg total) by mouth daily., Disp: 90 tablet, Rfl: 3 .  atorvastatin (LIPITOR) 40 MG tablet, Take 1 tablet (40 mg total) by mouth daily., Disp: 90 tablet, Rfl: 3 .  Azelastine-Fluticasone 137-50 MCG/ACT SUSP, Place 1 spray into the nose 2 (two) times daily., Disp: 23 g, Rfl: 3 .  Blood Glucose Monitoring Suppl (ONE TOUCH ULTRA SYSTEM KIT) W/DEVICE KIT, Use to check blood sugar twice a day, Disp: 1 each, Rfl: 0 .  clopidogrel (PLAVIX) 75 MG tablet, TAKE ONE TABLET BY MOUTH DAILY.,  Disp: 90 tablet, Rfl: 3 .  cyanocobalamin 100 MCG tablet, Take 100 mcg by mouth daily., Disp: , Rfl:  .  donepezil (ARICEPT) 23 MG TABS tablet, Take 1 tablet (23 mg total) by mouth at bedtime., Disp: 90 tablet, Rfl: 3 .  glipiZIDE (GLUCOTROL) 5 MG tablet, Take 2 tablets by mouth twice daily., Disp: 120 tablet, Rfl: 5 .  glucose blood (ONE TOUCH ULTRA TEST) test strip, USE TO CHECK BLOOD SUGAR TWICE DAILY, Disp: 100 each, Rfl: 3 .  ipratropium (ATROVENT) 0.03 % nasal spray, Place 2 sprays into both nostrils every 12 (twelve) hours., Disp: 30 mL, Rfl: 12 .  levocetirizine (XYZAL) 5 MG tablet, TAKE ONE TABLET BY MOUTH EVERY EVENING., Disp: 30 tablet, Rfl: 5 .  levothyroxine (SYNTHROID, LEVOTHROID) 50 MCG tablet, TAKE ONE TABLET BY MOUTH ONCE DAILY, Disp: 90 tablet, Rfl: 1 .  lisinopril (PRINIVIL,ZESTRIL) 40 MG tablet, Take 1 tablet (40 mg total) by mouth daily., Disp: 90 tablet, Rfl: 3 .  meloxicam (MOBIC) 7.5 MG tablet, TAKE ONE TABLET BY MOUTH ONCE DAILY., Disp: 30 tablet, Rfl: 0 .  metFORMIN (GLUCOPHAGE) 500 MG tablet, TAKE TWO (2) TABLETS BY MOUTH TWICE DAILY., Disp: 360 tablet, Rfl: 0 .  ONETOUCH DELICA LANCETS 18H MISC, 1 each by Other route See admin instructions. Check blood sugar twice daily, Disp: , Rfl: 0 .  tamsulosin (FLOMAX) 0.4 MG CAPS capsule, TAKE ONE CAPSULE BY MOUTH DAILY., Disp: 30 capsule, Rfl: 2 .  TRADJENTA 5 MG TABS tablet, TAKE ONE TABLET BY MOUTH DAILY, Disp: 30 tablet, Rfl: 4 .  TRULICITY 6.31 SH/7.78YO SOPN, INJECT 1 DOSE SUBCUTANEOUSLY ONCE A WEEK, Disp: 12 pen, Rfl: 0   Review of Systems  Constitutional: Negative for fever and unexpected weight change.  HENT: Positive for nosebleeds and postnasal drip. Negative for congestion, dental problem, ear pain, rhinorrhea, sinus pressure, sneezing, sore throat and trouble swallowing.   Eyes: Negative for redness and itching.  Respiratory: Positive for shortness of breath and wheezing. Negative for cough and chest tightness.     Cardiovascular: Positive for leg swelling. Negative for palpitations.  Gastrointestinal: Negative for nausea and vomiting.  Genitourinary: Negative for dysuria.  Musculoskeletal: Negative for joint swelling.  Skin: Negative for rash.  Allergic/Immunologic: Negative.  Negative for environmental allergies, food allergies and immunocompromised state.  Neurological: Negative for headaches.  Hematological: Does not bruise/bleed easily.  Psychiatric/Behavioral: Negative for dysphoric mood. The patient is not nervous/anxious.        Objective:   Physical Exam  Constitutional: He is oriented to person, place, and time. He appears well-developed and well-nourished. No distress.  HENT:  Head: Normocephalic and atraumatic.  Right Ear: External ear normal.  Left Ear: External ear normal.  Mouth/Throat: Oropharynx is clear and moist. No oropharyngeal exudate.  Eyes: Conjunctivae and EOM are normal. Pupils are  equal, round, and reactive to light. Right eye exhibits no discharge. Left eye exhibits no discharge. No scleral icterus.  Neck: Normal range of motion. Neck supple. No JVD present. No tracheal deviation present. No thyromegaly present.  Cardiovascular: Normal rate, regular rhythm and intact distal pulses. Exam reveals no gallop and no friction rub.  No murmur heard. Pulmonary/Chest: Effort normal and breath sounds normal. No respiratory distress. He has no wheezes. He has no rales. He exhibits no tenderness.  Rt base crackles Left base silent  Abdominal: Soft. Bowel sounds are normal. He exhibits no distension and no mass. There is no tenderness. There is no rebound and no guarding.  Musculoskeletal: Normal range of motion. He exhibits no edema or tenderness.  Clubbing +  Lymphadenopathy:    He has no cervical adenopathy.  Neurological: He is alert and oriented to person, place, and time. He has normal reflexes. No cranial nerve deficit. Coordination normal.  Skin: Skin is warm and dry.  No rash noted. He is not diaphoretic. No erythema. No pallor.  Psychiatric: He has a normal mood and affect. His behavior is normal. Judgment and thought content normal.  Nursing note and vitals reviewed.  Vitals:   07/09/17 1007  BP: 120/64  Pulse: 91  SpO2: 98%  Weight: 210 lb (95.3 kg)  Height: 5' 10"  (1.778 m)    Estimated body mass index is 30.13 kg/m as calculated from the following:   Height as of this encounter: 5' 10"  (1.778 m).   Weight as of this encounter: 210 lb (95.3 kg).        Assessment & Plan:     ICD-10-CM   1. Shortness of breath R06.02   2. Orthopnea R06.01   3. 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 Lung crackles R09.89   5. Exposure to welding arc, subsequent encounter W89.0XXD   6. Smoking history Z87.891   7. Diastolic dysfunction Y77.4       Your symptoms could be related to a number of causes from #3 to  #7 or some combination of these Need to rule out ILD or pulmonary fibrosis  Plan Do a sniff test High-resolution CT scan of the chest supine and prone  do full pulmonary function test  Follow-up -Return to see my nurse try to show myself after the above. - go to ER if needed    Dr. Brand Males, M.D., [facility].C.P Pulmonary and Critical Care Medicine Staff Physician, [place] Director - Interstitial Lung Disease  Program  Pulmonary [place] [facility] [place], [place], [address]  Pager: [phone], If no answer or between  15:00h - 7:00h: call [phone] Telephone: [phone]

## 2017-07-13 ENCOUNTER — Encounter (HOSPITAL_COMMUNITY): Payer: BLUE CROSS/BLUE SHIELD

## 2017-07-14 ENCOUNTER — Ambulatory Visit (HOSPITAL_COMMUNITY): Payer: BLUE CROSS/BLUE SHIELD

## 2017-07-16 ENCOUNTER — Ambulatory Visit (HOSPITAL_COMMUNITY)
Admission: RE | Admit: 2017-07-16 | Discharge: 2017-07-16 | Disposition: A | Discharge status: Home / Self Care | Payer: BLUE CROSS/BLUE SHIELD | Source: Ambulatory Visit | Attending: Cardiovascular Disease | Admitting: Cardiovascular Disease

## 2017-07-16 ENCOUNTER — Ambulatory Visit (HOSPITAL_COMMUNITY): Payer: BLUE CROSS/BLUE SHIELD

## 2017-07-16 DIAGNOSIS — R42 Dizziness and giddiness: Secondary | ICD-10-CM | POA: Insufficient documentation

## 2017-07-20 ENCOUNTER — Ambulatory Visit (HOSPITAL_COMMUNITY)
Admission: RE | Admit: 2017-07-20 | Discharge: 2017-07-20 | Disposition: A | Discharge status: Home / Self Care | Payer: BLUE CROSS/BLUE SHIELD | Source: Ambulatory Visit | Attending: Internal Medicine | Admitting: Internal Medicine

## 2017-07-20 DIAGNOSIS — R0602 Shortness of breath: Secondary | ICD-10-CM | POA: Diagnosis not present

## 2017-07-20 DIAGNOSIS — I2510 Atherosclerotic heart disease of native coronary artery without angina pectoris: Secondary | ICD-10-CM | POA: Diagnosis not present

## 2017-07-20 DIAGNOSIS — I700 Atherosclerosis of aorta: Secondary | ICD-10-CM | POA: Diagnosis not present

## 2017-07-20 DIAGNOSIS — R918 Other nonspecific abnormal finding of lung field: Secondary | ICD-10-CM | POA: Insufficient documentation

## 2017-07-22 ENCOUNTER — Telehealth: Payer: Self-pay | Admitting: Adult Health

## 2017-07-22 NOTE — Telephone Encounter (Signed)
Pt. Called for results of Carotid US, CT Chest, and Sniff Test.  Able to give results of the Carotid US, as had been reviewed by provider.  Advised unable to give results for the CT Chest and Sniff Test.  Will send message to provider. Verb. Understanding.

## 2017-07-22 NOTE — Telephone Encounter (Signed)
Noted.  CT and sniff test ordered by another physician.

## 2017-07-29 ENCOUNTER — Other Ambulatory Visit: Payer: Self-pay | Admitting: Adult Health

## 2017-07-29 NOTE — Telephone Encounter (Signed)
Sent to the pharmacy by e-scribe. 

## 2017-08-04 ENCOUNTER — Other Ambulatory Visit: Payer: Self-pay | Admitting: Adult Health

## 2017-08-04 DIAGNOSIS — R350 Frequency of micturition: Secondary | ICD-10-CM

## 2017-08-05 ENCOUNTER — Other Ambulatory Visit: Payer: Self-pay | Admitting: Adult Health

## 2017-08-05 DIAGNOSIS — R350 Frequency of micturition: Secondary | ICD-10-CM

## 2017-08-05 MED ORDER — CLOPIDOGREL BISULFATE 75 MG PO TABS: 75.0000 mg | ORAL_TABLET | Freq: Every day | ORAL | 3 refills | Status: DC

## 2017-08-05 MED ORDER — TAMSULOSIN HCL 0.4 MG PO CAPS: 0.4000 mg | ORAL_CAPSULE | Freq: Every day | ORAL | 3 refills | Status: DC

## 2017-08-20 ENCOUNTER — Ambulatory Visit (INDEPENDENT_AMBULATORY_CARE_PROVIDER_SITE_OTHER): Payer: BLUE CROSS/BLUE SHIELD | Admitting: Internal Medicine

## 2017-08-20 ENCOUNTER — Encounter: Payer: Self-pay | Admitting: Internal Medicine

## 2017-08-20 VITALS — BP 112/62 | HR 69 | Ht 70.5 in | Wt 200.6 lb

## 2017-08-20 DIAGNOSIS — I2510 Atherosclerotic heart disease of native coronary artery without angina pectoris: Secondary | ICD-10-CM | POA: Diagnosis not present

## 2017-08-20 DIAGNOSIS — I359 Nonrheumatic aortic valve disorder, unspecified: Secondary | ICD-10-CM

## 2017-08-20 DIAGNOSIS — I5189 Other ill-defined heart diseases: Secondary | ICD-10-CM

## 2017-08-20 DIAGNOSIS — R0602 Shortness of breath: Secondary | ICD-10-CM

## 2017-08-20 DIAGNOSIS — I519 Heart disease, unspecified: Secondary | ICD-10-CM | POA: Diagnosis not present

## 2017-08-20 NOTE — Patient Instructions (Addendum)
ICD-10-CM   1. Shortness of breath R06.02   2. Diastolic dysfunction P53.7   3. Coronary artery calcification seen on CAT scan I25.10   4. Aortic valve calcification I35.9    Your breathing muscles moved well on sniff test On the CT scan there was no pulmonary fibrosis However, you have calcium deposits on your heart valve and heart blood vessel. IN addition, in 2016 echo suggested you have stiff heart muscle  Plan Refer cardiologist [facility] heart care in [place], [place] for consideration of echo and stress test   Follow-up -Return to see me in 3 months; if heart issues do not explain shortness of breath we can reassess with bike pulmonary stress test

## 2017-08-20 NOTE — Progress Notes (Signed)
PFT completed 08/20/17

## 2017-08-20 NOTE — Progress Notes (Signed)
Subjective:     Patient ID: Scott Vasquez, male   DOB: 03-17-1941, 77 y.o.   MRN: [id number]  HPI PCP [name], NP  HPI  IOV 07/09/2017  Chief Complaint  Patient presents with  . Advice Only    Referred by [facility] for SOB. States that he is SOB all the time which is worse at bedtime.  Denies any cough or CP.   77 year old male.  History provided by patient and some from his wife and review of the charts.  He reports insidious onset of dyspnea 7 or 8 months ago.  At present only when he lies down and when he gets up in the morning.  He does not have orthopnea during his sleep.  No proximal nocturnal dyspnea.  He denies any cough although his wife thinks he has mild amount of cough.  They both categorically denies any dyspnea on exertion.  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  There is no associated chest pain or edema or wheezing or hemoptysis or chills or weight loss. Walking desaturation test with a forehead probe 185 feet x3 laps on room air: Resting heart rate 95/min  Final heart rate 127/min.  Resting pulse ox 98%.  Final pulse ox 96%.  Basically he got tachycardic without desaturating.   Review of his recent labs include -2016 echocardiogram with grade 2 diastolic dysfunction but normal systolic function -EKG in October 2018 with normal sinus rhythm without tachycardia [he is tachycardic today] -Chest x-ray October 2018 with possible left diaphragm elevation otherwise clear lung fields.  Personally visualized - blood labs October 2018 with a creatinine 1.5 mg percent and a hemoglobin 11 g%. - feno 07/09/2017 - > 14 ppb and normal - allergy work up 3 weeks ago per hx - normal (dr Donneta Romberg)   has a past medical history of Anemia, Diabetes mellitus, Diverticulosis of colon,  Hypertension, Stroke ([place]), and Thyroid nodule.   reports that he quit smoking about 58 years ago. he has never used smokeless tobacco. 5pack smoking hix   OV 08/20/2017  Chief Complaint  Patient presents with  . Follow-up    PFT done today. HRCT and sniff test 07/20/17.   [place] [address] [place] [address] male  -returns for dyspnea evaluation.  He has had tests.  The concern was possible interstitial lung disease therefore he had high-resolution CT chest.  This was read by Dr. Vinnie Langton of thoracic radiology and there is no interstitial lung disease noted.  Interestingly his pulmonary function test today shows restriction with low diffusion capacity although looking at the flow volume loop there might be some effort issues with the testing.  And therefore could be an accurate he also had suspicion for elevated and paralyzed hemidiaphragm.  Therefore patient had a significant test and according to the report both diaphragms moved well.  There is concerned he might have atelectasis.  Of note on high-resolution CT scan of the chest there is evidence of coronary artery calcification and aortic calcification.  He did have an echocardiogram in 2016 that shows mild aortic sclerosis with calcification but also grade 2 diastolic dysfunction.       has a past medical history of Anemia, Diabetes mellitus, Diverticulosis of colon, Hypertension,  Stroke [facility]), and Thyroid nodule.   reports that he quit smoking about 59 years ago. His smoking use included cigarettes. He started smoking about 66 years ago. He has a 10.00 pack-year smoking history. he has never used smokeless tobacco.  Past Surgical History:  Procedure Laterality Date  . BACK SURGERY    . CATARACT EXTRACTION, BILATERAL    . SHOULDER SURGERY  08/2007   Supple  . THYROID LOBECTOMY Left 04/29/2015  . THYROID LOBECTOMY Left 04/29/2015   Procedure: THYROID LOBECTOMY;  Surgeon: Armandina Gemma, MD;  Location: [place];  Service:  General;  Laterality: Left;    No Known Allergies  Immunization History  Administered Date(s) Administered  . Influenza Split 07/13/2011, 08/19/2012  . Influenza Whole 08/15/2004, 06/10/2007, 05/17/2008, 05/03/2009, 05/02/2010  . Influenza, High Dose Seasonal PF 06/07/2015, 05/29/2016, 05/11/2017  . Influenza,inj,Quad PF,6+ Mos 05/26/2013, 04/20/2014  . Pneumococcal Conjugate-13 11/26/2014  . Pneumococcal Polysaccharide-23 01/16/2011  . Td 08/10/2004  . Tdap 11/26/2014    Family History  Problem Relation Age of Onset  . Diabetes Unknown   . Diabetes Father        Died from diabetic coma in 10-16-70  . Hypertension Father   . Stroke Mother        Died in 15  . Hypertension Mother      Current Outpatient Medications:  .  albuterol (PROAIR HFA) 108 (90 Base) MCG/ACT inhaler, Inhale 2 puffs into the lungs every 6 (six) hours as needed for wheezing or shortness of breath., Disp: 1 Inhaler, Rfl: 2 .  atorvastatin (LIPITOR) 40 MG tablet, Take 1 tablet (40 mg total) by mouth daily., Disp: 90 tablet, Rfl: 3 .  Azelastine-Fluticasone 137-50 MCG/ACT SUSP, Place 1 spray into the nose 2 (two) times daily., Disp: 23 g, Rfl: 3 .  Blood Glucose Monitoring Suppl (ONE TOUCH ULTRA SYSTEM KIT) W/DEVICE KIT, Use to check blood sugar twice a day, Disp: 1 each, Rfl: 0 .  clopidogrel (PLAVIX) 75 MG tablet, Take 1 tablet (75 mg total) by mouth daily., Disp: 90 tablet, Rfl: 3 .  cyanocobalamin 100 MCG tablet, Take 100 mcg by mouth daily., Disp: , Rfl:  .  donepezil (ARICEPT) 23 MG TABS tablet, Take 1 tablet (23 mg total) by mouth at bedtime., Disp: 90 tablet, Rfl: 3 .  glipiZIDE (GLUCOTROL) 5 MG tablet, Take 2 tablets by mouth twice daily., Disp: 120 tablet, Rfl: 5 .  glucose blood (ONE TOUCH ULTRA TEST) test strip, USE TO CHECK BLOOD SUGAR TWICE DAILY, Disp: 100 each, Rfl: 3 .  ipratropium (ATROVENT) 0.03 % nasal spray, Place 2 sprays into both nostrils every 12 (twelve) hours., Disp: 30 mL, Rfl: 12 .   levocetirizine (XYZAL) 5 MG tablet, TAKE ONE TABLET BY MOUTH EVERY EVENING., Disp: 30 tablet, Rfl: 5 .  levothyroxine (SYNTHROID, LEVOTHROID) 50 MCG tablet, TAKE ONE TABLET BY MOUTH ONCE DAILY, Disp: 90 tablet, Rfl: 2 .  lisinopril (PRINIVIL,ZESTRIL) 40 MG tablet, Take 1 tablet (40 mg total) by mouth daily., Disp: 90 tablet, Rfl: 3 .  meloxicam (MOBIC) 7.5 MG tablet, TAKE ONE TABLET BY MOUTH ONCE DAILY., Disp: 30 tablet, Rfl: 0 .  metFORMIN (GLUCOPHAGE) 500 MG tablet, TAKE TWO (2) TABLETS BY MOUTH TWICE DAILY., Disp: 360 tablet, Rfl: 0 .  ONETOUCH DELICA LANCETS 14E MISC, 1 each by Other route See admin instructions. Check blood sugar twice daily, Disp: , Rfl: 0 .  tamsulosin (FLOMAX) 0.4 MG CAPS capsule, Take 1 capsule (0.4 mg total) by mouth daily., Disp: 90  capsule, Rfl: 3 .  TRADJENTA 5 MG TABS tablet, TAKE ONE TABLET BY MOUTH DAILY, Disp: 30 tablet, Rfl: 4 .  TRULICITY 0.23 XI/3.5WY SOPN, INJECT 1 DOSE SUBCUTANEOUSLY ONCE A WEEK, Disp: 12 pen, Rfl: 0   Review of Systems     Objective:   Physical Exam Body mass index is 28.38 kg/m.  Estimated body mass index is 28.38 kg/m as calculated from the following:   Height as of this encounter: 5' 10.5" (1.791 m).   Weight as of this encounter: 200 lb 9.6 oz (91 kg). Discussion only visit    Assessment:       ICD-10-CM   1. Shortness of breath R06.02   2. Diastolic dysfunction S16.8   3. Coronary artery calcification seen on CAT scan I25.10   4. Aortic valve calcification I35.9    Given the presence of coronary artery calcification and previous grade 2 diastolic dysfunction and descriptions of aortic valve calcification I think the first priority is to rule either ischemic or valvular or myocardial dysfunction related dyspnea. In the absence of interstitial lung disease or diaphragmatic paralysis a restricted pulmonary function test would suggest neuromuscular weakness but I do not think is the caase.  Instead I think it is an effort  issue and technique issue.     Plan:      Your breathing muscles moved well on sniff test On the CT scan there was no pulmonary fibrosis However, you have calcium deposits on your heart valve and heart blood vessel. IN addition, in 2016 echo suggested you have stiff heart muscle  Plan Refer cardiologist [facility] in [place], [place] for consideration of echo and stress test   Follow-up -Return to see me in 3 months; if heart issues do not explain shortness of breath we can reassess with bike pulmonary stress test  > 50% of this > 25 min visit spent in face to face counseling or coordination of care    Dr. Brand Males, M.D., [facility].C.P Pulmonary and Critical Care Medicine Staff Physician, [place] Director - Interstitial Lung Disease  Program  Pulmonary [place] at [place], [place], [address]  Pager: [phone], If no answer or between  15:00h - 7:00h: call [phone] Telephone: [phone]

## 2017-08-26 ENCOUNTER — Ambulatory Visit (INDEPENDENT_AMBULATORY_CARE_PROVIDER_SITE_OTHER): Payer: BLUE CROSS/BLUE SHIELD | Admitting: Cardiovascular Disease

## 2017-08-26 ENCOUNTER — Encounter: Payer: Self-pay | Admitting: Cardiovascular Disease

## 2017-08-26 VITALS — BP 106/54 | HR 108 | Ht 70.5 in | Wt 203.0 lb

## 2017-08-26 DIAGNOSIS — E785 Hyperlipidemia, unspecified: Secondary | ICD-10-CM | POA: Diagnosis not present

## 2017-08-26 DIAGNOSIS — R000 Tachycardia, unspecified: Secondary | ICD-10-CM | POA: Diagnosis not present

## 2017-08-26 DIAGNOSIS — I10 Essential (primary) hypertension: Secondary | ICD-10-CM | POA: Diagnosis not present

## 2017-08-26 DIAGNOSIS — I2510 Atherosclerotic heart disease of native coronary artery without angina pectoris: Secondary | ICD-10-CM | POA: Diagnosis not present

## 2017-08-26 DIAGNOSIS — R0602 Shortness of breath: Secondary | ICD-10-CM | POA: Diagnosis not present

## 2017-08-26 DIAGNOSIS — I519 Heart disease, unspecified: Secondary | ICD-10-CM | POA: Diagnosis not present

## 2017-08-26 DIAGNOSIS — Z8673 Personal history of transient ischemic attack (TIA), and cerebral infarction without residual deficits: Secondary | ICD-10-CM

## 2017-08-26 NOTE — Patient Instructions (Signed)
Your physician recommends that you schedule a follow-up appointment in: 2 months with [place]    Your physician has requested that you have an echocardiogram. Echocardiography is a painless test that uses sound waves to create images of your heart. It provides your doctor with information about the size and shape of your heart and how well your heart's chambers and valves are working. This procedure takes approximately one hour. There are no restrictions for this procedure.    Your physician has requested that you have en exercise stress myoview. For further information please visit [email or web address]. Please follow instruction sheet, as given.     Your physician recommends that you continue on your current medications as directed. Please refer to the Current Medication list given to you today.    If you need a refill on your cardiac medications before your next appointment, please call your pharmacy.     No lab work ordered today.     Thank you for choosing [place] !

## 2017-08-26 NOTE — Progress Notes (Signed)
CARDIOLOGY CONSULT NOTE  Patient ID: Scott Vasquez MRN: [id number] DOB/AGE: 79-Sep-1942 79 y.o.  Admit date: (Not on file) Primary Physician: [name], NP Referring Physician: Dr. Chase Caller  Reason for Consultation: Shortness of breath, diastolic dysfunction, aortic valve calcification, and coronary artery calcifications.  HPI: Scott Vasquez is a 77 y.o. male who is being seen today for the evaluation of shortness of breath, diastolic dysfunction, aortic valve calcification, and coronary artery calcifications at the request of Dr. Chase Caller Past medical history includes hypertension, type 2 diabetes, hyperlipidemia, and CVA.  He was evaluated by pulmonology on 08/20/17.    He tells me he has been short of breath for the past 5 or 6 months but only experiences it at night when lying down.  He denies exertional dyspnea altogether.  He also denies orthopnea and paroxysmal nocturnal dyspnea.  He denies exertional chest pain.  He presently works at [employer].   He quit smoking about 58 years ago.  A high resolution chest CT performed on 07/20/17 demonstrated aortic atherosclerosis as well as atherosclerosis of the great vessels of the mediastinum and the coronary arteries, including calcified atherosclerotic plaque in the left main, left anterior descending, and left circumflex coronary arteries, as well as calcifications noted of the aortic valve.  Most recent echocardiogram I find was performed on 10/22/14 which demonstrated normal left ventricular systolic function and regional wall motion, LVEF 60-65%, mild LVH, grade 2 diastolic dysfunction, mild aortic annular calcification, and mild aortic regurgitation.  ECG performed on 05/11/17 which I personally interpreted demonstrated sinus rhythm with no ischemic ST segment or T wave abnormalities, nor any arrhythmias.  Carotid Dopplers on 07/16/17 showed no evidence of internal carotid artery stenosis.   No Known  Allergies  Current Outpatient Medications  Medication Sig Dispense Refill  . albuterol (PROAIR HFA) 108 (90 Base) MCG/ACT inhaler Inhale 2 puffs into the lungs every 6 (six) hours as needed for wheezing or shortness of breath. 1 Inhaler 2  . atorvastatin (LIPITOR) 40 MG tablet Take 1 tablet (40 mg total) by mouth daily. 90 tablet 3  . Azelastine-Fluticasone 137-50 MCG/ACT SUSP Place 1 spray into the nose 2 (two) times daily. 23 g 3  . Blood Glucose Monitoring Suppl (ONE TOUCH ULTRA SYSTEM KIT) W/DEVICE KIT Use to check blood sugar twice a day 1 each 0  . clopidogrel (PLAVIX) 75 MG tablet Take 1 tablet (75 mg total) by mouth daily. 90 tablet 3  . cyanocobalamin 100 MCG tablet Take 100 mcg by mouth daily.    Marland Kitchen donepezil (ARICEPT) 23 MG TABS tablet Take 1 tablet (23 mg total) by mouth at bedtime. 90 tablet 3  . glipiZIDE (GLUCOTROL) 5 MG tablet Take 2 tablets by mouth twice daily. 120 tablet 5  . glucose blood (ONE TOUCH ULTRA TEST) test strip USE TO CHECK BLOOD SUGAR TWICE DAILY 100 each 3  . ipratropium (ATROVENT) 0.03 % nasal spray Place 2 sprays into both nostrils every 12 (twelve) hours. 30 mL 12  . levocetirizine (XYZAL) 5 MG tablet TAKE ONE TABLET BY MOUTH EVERY EVENING. 30 tablet 5  . levothyroxine (SYNTHROID, LEVOTHROID) 50 MCG tablet TAKE ONE TABLET BY MOUTH ONCE DAILY 90 tablet 2  . lisinopril (PRINIVIL,ZESTRIL) 40 MG tablet Take 1 tablet (40 mg total) by mouth daily. 90 tablet 3  . meloxicam (MOBIC) 7.5 MG tablet TAKE ONE TABLET BY MOUTH ONCE DAILY. 30 tablet 0  . metFORMIN (GLUCOPHAGE) 500 MG tablet TAKE TWO (2)  TABLETS BY MOUTH TWICE DAILY. 360 tablet 0  . ONETOUCH DELICA LANCETS 40C MISC 1 each by Other route See admin instructions. Check blood sugar twice daily  0  . tamsulosin (FLOMAX) 0.4 MG CAPS capsule Take 1 capsule (0.4 mg total) by mouth daily. 90 capsule 3  . TRADJENTA 5 MG TABS tablet TAKE ONE TABLET BY MOUTH DAILY 30 tablet 4  . TRULICITY 1.44 YJ/8.5UD SOPN INJECT 1 DOSE  SUBCUTANEOUSLY ONCE A WEEK 12 pen 0   No current facility-administered medications for this visit.     Past Medical History:  Diagnosis Date  . Anemia    iron deficiency  . Diabetes mellitus    type II  . Diverticulosis of colon   . Hypertension   . Stroke ([place])    numbness in left hand  . Thyroid nodule     Past Surgical History:  Procedure Laterality Date  . BACK SURGERY    . CATARACT EXTRACTION, BILATERAL    . SHOULDER SURGERY  08/2007   Supple  . THYROID LOBECTOMY Left 04/29/2015  . THYROID LOBECTOMY Left 04/29/2015   Procedure: THYROID LOBECTOMY;  Surgeon: Armandina Gemma, MD;  Location: [facility];  Service: General;  Laterality: Left;    Social History   Socioeconomic History  . Marital status: Married    Spouse name: Not on file  . Number of children: Not on file  . Years of education: Not on file  . Highest education level: Not on file  Social Needs  . Financial resource strain: Not on file  . Food insecurity - worry: Not on file  . Food insecurity - inability: Not on file  . Transportation needs - medical: Not on file  . Transportation needs - non-medical: Not on file  Occupational History  . Occupation: retired    Fish farm manager: Scott Vasquez Use  . Smoking status: Former Smoker    Packs/day: 0.50    Years: 20.00    Pack years: 10.00    Types: Cigarettes    Start date: 1953    Last attempt to quit: 08/10/1958    Years since quitting: 59.0  . Smokeless tobacco: Never Used  Substance and Sexual Activity  . Alcohol use: No  . Drug use: No  . Sexual activity: Not on file  Other Topics Concern  . Not on file  Social History Narrative   Works at [employer] for 10 years/ works 5 days ; 40 hour week    - Married with two children who live locally   - No pets   - Attend church for fun.     No family history of premature CAD in 1st degree relatives.  Current Meds  Medication Sig  . albuterol (PROAIR HFA) 108 (90 Base) MCG/ACT inhaler Inhale 2 puffs into the  lungs every 6 (six) hours as needed for wheezing or shortness of breath.  Marland Kitchen atorvastatin (LIPITOR) 40 MG tablet Take 1 tablet (40 mg total) by mouth daily.  . Azelastine-Fluticasone 137-50 MCG/ACT SUSP Place 1 spray into the nose 2 (two) times daily.  . Blood Glucose Monitoring Suppl (ONE TOUCH ULTRA SYSTEM KIT) W/DEVICE KIT Use to check blood sugar twice a day  . clopidogrel (PLAVIX) 75 MG tablet Take 1 tablet (75 mg total) by mouth daily.  . cyanocobalamin 100 MCG tablet Take 100 mcg by mouth daily.  Marland Kitchen donepezil (ARICEPT) 23 MG TABS tablet Take 1 tablet (23 mg total) by mouth at bedtime.  Marland Kitchen glipiZIDE (GLUCOTROL) 5 MG tablet Take  2 tablets by mouth twice daily.  Marland Kitchen glucose blood (ONE TOUCH ULTRA TEST) test strip USE TO CHECK BLOOD SUGAR TWICE DAILY  . ipratropium (ATROVENT) 0.03 % nasal spray Place 2 sprays into both nostrils every 12 (twelve) hours.  Marland Kitchen levocetirizine (XYZAL) 5 MG tablet TAKE ONE TABLET BY MOUTH EVERY EVENING.  Marland Kitchen levothyroxine (SYNTHROID, LEVOTHROID) 50 MCG tablet TAKE ONE TABLET BY MOUTH ONCE DAILY  . lisinopril (PRINIVIL,ZESTRIL) 40 MG tablet Take 1 tablet (40 mg total) by mouth daily.  . meloxicam (MOBIC) 7.5 MG tablet TAKE ONE TABLET BY MOUTH ONCE DAILY.  . metFORMIN (GLUCOPHAGE) 500 MG tablet TAKE TWO (2) TABLETS BY MOUTH TWICE DAILY.  Marland Kitchen ONETOUCH DELICA LANCETS 07P MISC 1 each by Other route See admin instructions. Check blood sugar twice daily  . tamsulosin (FLOMAX) 0.4 MG CAPS capsule Take 1 capsule (0.4 mg total) by mouth daily.  . TRADJENTA 5 MG TABS tablet TAKE ONE TABLET BY MOUTH DAILY  . TRULICITY 7.10 GY/6.9SW SOPN INJECT 1 DOSE SUBCUTANEOUSLY ONCE A WEEK      Review of systems complete and found to be negative unless listed above in HPI    Physical exam Blood pressure (!) 106/54, pulse (!) 108, height 5' 10.5" (1.791 m), weight 203 lb (92.1 kg), SpO2 96 %. General: NAD Neck: No JVD, no thyromegaly or thyroid nodule.  Lungs: Clear to auscultation  bilaterally with normal respiratory effort. CV: Nondisplaced PMI.  Mildly tachycardic, regular rhythm, normal S1/S2, no N4/O2, 1/6 systolic murmur loudest over left upper sternal border.  No peripheral edema.  No carotid bruit.    Abdomen: Soft, nontender, no distention.  Skin: Intact without lesions or rashes.  Neurologic: Alert and oriented x 3.  Psych: Normal affect. Extremities: No clubbing or cyanosis.  HEENT: Normal.   ECG: Most recent ECG reviewed.   Labs: Lab Results  Component Value Date/Time   K 4.3 05/11/2017 05:00 PM   BUN 21 05/11/2017 05:00 PM   CREATININE 1.49 05/11/2017 05:00 PM   ALT 11 07/31/2016 09:27 AM   TSH 2.74 05/11/2017 05:00 PM   HGB 11.3 (L) 05/11/2017 05:00 PM     Lipids: Lab Results  Component Value Date/Time   LDLCALC 61 07/31/2016 09:27 AM   CHOL 126 07/31/2016 09:27 AM   TRIG 59.0 07/31/2016 09:27 AM   TRIG 31 07/16/2006 10:37 AM   HDL 53.10 07/31/2016 09:27 AM        ASSESSMENT AND PLAN:   1.  Shortness of breath with coronary artery calcifications and aortic valve calcifications and known grade 2 diastolic dysfunction: Symptoms are very atypical for ischemic heart disease and that he denies exertional symptoms altogether. His symptoms are primarily when he lies down at night. He does have coronary artery calcifications and aortic valve calcifications as noted above by CT scan, and he does have cardiovascular risk factors which include type 2 diabetes, hypertension, and hyperlipidemia.   I will obtain an exercise Myoview nuclear stress test for further clarification.  I will also obtain an echocardiogram to evaluate cardiac structure and function.  He is mildly tachycardic at rest.  Hemoglobin was 11.3 on 05/11/17.  TSH was normal.  2.  Hypertension: Blood pressure is normal.  No changes to therapy.  3.  Hyperlipidemia: Continue statin therapy.  4.  History of CVA: Currently on Plavix and Lipitor.  5. Tachycardia: He is mildly tachycardic  at rest.  Hemoglobin was 11.3 on 05/11/17.  TSH was normal.   Disposition: Follow up in 2  months  Signed: Kate Sable, M.D., F.A.C.C.  08/26/2017, 1:08 PM

## 2017-08-27 ENCOUNTER — Telehealth: Payer: Self-pay | Admitting: Adult Health

## 2017-08-27 ENCOUNTER — Ambulatory Visit: Payer: BLUE CROSS/BLUE SHIELD | Admitting: Adult Health

## 2017-08-27 NOTE — Telephone Encounter (Signed)
Copied from [place] [id number]. Topic: General - Other >> Aug 27, 2017 11:23 AM Darl Householder, RMA wrote: Reason for CRM: Patient's wife is calling requesting that medication Aricept 23 mg be changed to a higher dose due to patient is declining, please return call to pt wife [name] at [phone]

## 2017-08-27 NOTE — Telephone Encounter (Signed)
23 mg is the highest dose. Please have them follow up so we can discuss other options

## 2017-08-27 NOTE — Telephone Encounter (Signed)
Last OV was in November for HTN. Do you want OV before changing meds?

## 2017-08-30 NOTE — Telephone Encounter (Signed)
Returned call to [place]. Appt scheduled 09/10/17 per her request.

## 2017-09-03 ENCOUNTER — Encounter (HOSPITAL_COMMUNITY)
Admission: RE | Admit: 2017-09-03 | Discharge: 2017-09-03 | Disposition: A | Discharge status: Home / Self Care | Payer: BLUE CROSS/BLUE SHIELD | Source: Ambulatory Visit | Attending: Cardiovascular Disease | Admitting: Cardiovascular Disease

## 2017-09-03 ENCOUNTER — Encounter (HOSPITAL_BASED_OUTPATIENT_CLINIC_OR_DEPARTMENT_OTHER)
Admission: RE | Admit: 2017-09-03 | Discharge: 2017-09-03 | Disposition: A | Discharge status: Home / Self Care | Payer: BLUE CROSS/BLUE SHIELD | Source: Ambulatory Visit | Attending: Cardiovascular Disease | Admitting: Cardiovascular Disease

## 2017-09-03 ENCOUNTER — Ambulatory Visit (HOSPITAL_BASED_OUTPATIENT_CLINIC_OR_DEPARTMENT_OTHER)
Admission: RE | Admit: 2017-09-03 | Discharge: 2017-09-03 | Disposition: A | Discharge status: Home / Self Care | Payer: BLUE CROSS/BLUE SHIELD | Source: Ambulatory Visit | Attending: Cardiovascular Disease | Admitting: Cardiovascular Disease

## 2017-09-03 ENCOUNTER — Encounter (HOSPITAL_COMMUNITY): Payer: Self-pay

## 2017-09-03 DIAGNOSIS — R0602 Shortness of breath: Secondary | ICD-10-CM

## 2017-09-03 DIAGNOSIS — E119 Type 2 diabetes mellitus without complications: Secondary | ICD-10-CM | POA: Insufficient documentation

## 2017-09-03 DIAGNOSIS — I119 Hypertensive heart disease without heart failure: Secondary | ICD-10-CM

## 2017-09-03 DIAGNOSIS — E785 Hyperlipidemia, unspecified: Secondary | ICD-10-CM

## 2017-09-03 DIAGNOSIS — I080 Rheumatic disorders of both mitral and aortic valves: Secondary | ICD-10-CM

## 2017-09-03 DIAGNOSIS — Z8673 Personal history of transient ischemic attack (TIA), and cerebral infarction without residual deficits: Secondary | ICD-10-CM | POA: Insufficient documentation

## 2017-09-03 LAB — NM MYOCAR MULTI W/SPECT W/WALL MOTION / EF
CHL CUP MPHR: 142 {beats}/min
CHL CUP NUCLEAR SRS: 0
CHL CUP NUCLEAR SSS: 1
CHL CUP RESTING HR STRESS: 57 {beats}/min
CSEPED: 4 min
CSEPEDS: 0 s
CSEPHR: 104 %
Estimated workload: 4.6 METS
LV dias vol: 76 mL (ref 62–150)
LV sys vol: 27 mL
Peak HR: 148 {beats}/min
RATE: 0.34
RPE: 12
SDS: 1
TID: 1.04

## 2017-09-03 MED ORDER — REGADENOSON 0.4 MG/5ML IV SOLN
INTRAVENOUS | Status: AC
  Filled 2017-09-03: qty 5

## 2017-09-03 MED ORDER — TECHNETIUM TC 99M TETROFOSMIN IV KIT
30.0000 | PACK | Freq: Once | INTRAVENOUS | Status: AC | PRN
  Administered 2017-09-03: 31 via INTRAVENOUS

## 2017-09-03 MED ORDER — SODIUM CHLORIDE 0.9% FLUSH
INTRAVENOUS | Status: AC
  Administered 2017-09-03: 10 mL via INTRAVENOUS
  Filled 2017-09-03: qty 10

## 2017-09-03 MED ORDER — TECHNETIUM TC 99M TETROFOSMIN IV KIT
10.0000 | PACK | Freq: Once | INTRAVENOUS | Status: AC | PRN
  Administered 2017-09-03: 11 via INTRAVENOUS

## 2017-09-03 NOTE — Progress Notes (Signed)
*  PRELIMINARY RESULTS* Echocardiogram 2D Echocardiogram has been performed.  Leavy Cella 09/03/2017, 1:30 PM

## 2017-09-07 ENCOUNTER — Telehealth: Payer: Self-pay | Admitting: *Deleted

## 2017-09-07 NOTE — Telephone Encounter (Signed)
Notes recorded by Laurine Blazer, LPN on 11/27/9142 at 4:58 AM EST Patient notified. Copy to pmd. Follow up scheduled for March with Dr. Bronson Ing.   ------  Notes recorded by Herminio Commons, MD on 09/03/2017 at 2:11 PM EST Low risk

## 2017-09-10 ENCOUNTER — Ambulatory Visit: Payer: BLUE CROSS/BLUE SHIELD | Admitting: Adult Health

## 2017-09-24 ENCOUNTER — Ambulatory Visit (INDEPENDENT_AMBULATORY_CARE_PROVIDER_SITE_OTHER): Payer: BLUE CROSS/BLUE SHIELD | Admitting: Adult Health

## 2017-09-24 ENCOUNTER — Encounter: Payer: Self-pay | Admitting: Adult Health

## 2017-09-24 VITALS — BP 144/70 | Temp 98.1°F | Wt 202.0 lb

## 2017-09-24 DIAGNOSIS — IMO0002 Reserved for concepts with insufficient information to code with codable children: Secondary | ICD-10-CM

## 2017-09-24 DIAGNOSIS — E1165 Type 2 diabetes mellitus with hyperglycemia: Secondary | ICD-10-CM | POA: Diagnosis not present

## 2017-09-24 DIAGNOSIS — E118 Type 2 diabetes mellitus with unspecified complications: Secondary | ICD-10-CM | POA: Diagnosis not present

## 2017-09-24 LAB — POCT GLYCOSYLATED HEMOGLOBIN (HGB A1C): HEMOGLOBIN A1C: 10

## 2017-09-24 MED ORDER — DULAGLUTIDE 1.5 MG/0.5ML ~~LOC~~ SOAJ: 1.5000 mg | SUBCUTANEOUS | 2 refills | Status: DC

## 2017-09-24 MED ORDER — LINAGLIPTIN 5 MG PO TABS: 5.0000 mg | ORAL_TABLET | Freq: Every day | ORAL | 0 refills | Status: DC

## 2017-09-24 NOTE — Progress Notes (Signed)
Subjective:    Patient ID: Scott Vasquez, male    DOB: Nov 23, 1940, 77 y.o.   MRN: [id number]  Patient presents for chronica medical management of HTN and T2DM.  His last A1C in October 2018 was 9.4 and he was started on Trulicity.  His blood pressure  At his November 2018 visit was also running low and we stopped his amlodipine.  His POC A1C today is 10.  In the last 6-8 weeks he has not been able get his fasting blood sugar under 220.  This is despite cutting out all sodas, cookies, cakes.  He is still having some sweet tea a couple days a week and a small slice of cake once or twice a week.  Has previously been to diabetic dietician and is not interested in going again..    Review of Systems  Constitutional: Negative.   HENT: Negative.   Respiratory: Negative.   Cardiovascular: Negative.   Gastrointestinal: Negative.   Endocrine: Negative.   Genitourinary: Negative.     Past Medical History:  Diagnosis Date  . Anemia    iron deficiency  . Diabetes mellitus    type II  . Diverticulosis of colon   . Hypertension   . Stroke ([place])    numbness in left hand  . Thyroid nodule     Social History   Socioeconomic History  . Marital status: Married    Spouse name: Not on file  . Number of children: Not on file  . Years of education: Not on file  . Highest education level: Not on file  Social Needs  . Financial resource strain: Not on file  . Food insecurity - worry: Not on file  . Food insecurity - inability: Not on file  . Transportation needs - medical: Not on file  . Transportation needs - non-medical: Not on file  Occupational History  . Occupation: retired    Fish farm manager: [employer]  . Smoking status: Former Smoker    Packs/day: 0.50    Years: 20.00    Pack years: 10.00    Types: Cigarettes    Start date: 18-Oct-1951    Last attempt to quit: 08/10/1958    Years since quitting: 59.1  . Smokeless tobacco: Never Used  Substance and Sexual Activity  . Alcohol use: No    . Drug use: No  . Sexual activity: Not on file  Other Topics Concern  . Not on file  Social History Narrative   Works at [employer] for 10 years/ works 5 days ; 40 hour week    - Married with two children who live locally   - No pets   - Attend church for fun.    Past Surgical History:  Procedure Laterality Date  . BACK SURGERY    . CATARACT EXTRACTION, BILATERAL    . SHOULDER SURGERY  08/2007   Supple  . THYROID LOBECTOMY Left 04/29/2015  . THYROID LOBECTOMY Left 04/29/2015   Procedure: THYROID LOBECTOMY;  Surgeon: Armandina Gemma, MD;  Location: [facility] OR;  Service: General;  Laterality: Left;    Family History  Problem Relation Age of Onset  . Diabetes Unknown   . Diabetes Father        Died from diabetic coma in 17-Oct-1970  . Hypertension Father   . Stroke Mother        Died in 72  . Hypertension Mother     No Known Allergies  Current Outpatient Medications on File Prior to Visit  Medication Sig Dispense Refill  . albuterol (PROAIR HFA) 108 (90 Base) MCG/ACT inhaler Inhale 2 puffs into the lungs every 6 (six) hours as needed for wheezing or shortness of breath. 1 Inhaler 2  . atorvastatin (LIPITOR) 40 MG tablet Take 1 tablet (40 mg total) by mouth daily. 90 tablet 3  . Azelastine-Fluticasone 137-50 MCG/ACT SUSP Place 1 spray into the nose 2 (two) times daily. 23 g 3  . Blood Glucose Monitoring Suppl (ONE TOUCH ULTRA SYSTEM KIT) W/DEVICE KIT Use to check blood sugar twice a day 1 each 0  . clopidogrel (PLAVIX) 75 MG tablet Take 1 tablet (75 mg total) by mouth daily. 90 tablet 3  . cyanocobalamin 100 MCG tablet Take 100 mcg by mouth daily.    Marland Kitchen donepezil (ARICEPT) 23 MG TABS tablet Take 1 tablet (23 mg total) by mouth at bedtime. 90 tablet 3  . glipiZIDE (GLUCOTROL) 5 MG tablet Take 2 tablets by mouth twice daily. 120 tablet 5  . glucose blood (ONE TOUCH ULTRA TEST) test strip USE TO CHECK BLOOD SUGAR TWICE DAILY 100 each 3  . ipratropium (ATROVENT) 0.03 % nasal spray Place 2 sprays  into both nostrils every 12 (twelve) hours. 30 mL 12  . levocetirizine (XYZAL) 5 MG tablet TAKE ONE TABLET BY MOUTH EVERY EVENING. 30 tablet 5  . levothyroxine (SYNTHROID, LEVOTHROID) 50 MCG tablet TAKE ONE TABLET BY MOUTH ONCE DAILY 90 tablet 2  . lisinopril (PRINIVIL,ZESTRIL) 40 MG tablet Take 1 tablet (40 mg total) by mouth daily. 90 tablet 3  . meloxicam (MOBIC) 7.5 MG tablet TAKE ONE TABLET BY MOUTH ONCE DAILY. 30 tablet 0  . metFORMIN (GLUCOPHAGE) 500 MG tablet TAKE TWO (2) TABLETS BY MOUTH TWICE DAILY. 360 tablet 0  . ONETOUCH DELICA LANCETS 29V MISC 1 each by Other route See admin instructions. Check blood sugar twice daily  0  . tamsulosin (FLOMAX) 0.4 MG CAPS capsule Take 1 capsule (0.4 mg total) by mouth daily. 90 capsule 3  . TRADJENTA 5 MG TABS tablet TAKE ONE TABLET BY MOUTH DAILY 30 tablet 4  . TRULICITY 9.16 OM/6.0OK SOPN INJECT 1 DOSE SUBCUTANEOUSLY ONCE A WEEK 12 pen 0   No current facility-administered medications on file prior to visit.     BP (!) 144/70 (BP Location: Left Arm)   Temp 98.1 F (36.7 C) (Oral)   Wt 202 lb (91.6 kg)   BMI 28.57 kg/m      Objective:   Physical Exam  Constitutional: He appears well-developed and well-nourished. No distress.  Cardiovascular: Normal rate, regular rhythm and intact distal pulses. Exam reveals no gallop and no friction rub.  Murmur heard.  mid sternal border  Pulmonary/Chest: Effort normal and breath sounds normal.  Musculoskeletal: He exhibits no edema.  Nursing note and vitals reviewed.     Assessment & Plan:  1. Diabetes mellitus type 2 with complications, uncontrolled ([place]) Patient has not been taking Tradjenta.  Please restart taking this medication.   Will increase Trulicity to 1.5 mg weekly.  Will consider endocrine referral at next appointment if blood sugars and A1C have not improved.  Patient would like to continue to work on this without endocrinology right now.  Continue to work on diet.  - POCT  H9X  Jermeka Schlotterbeck C Herson Prichard BSN RN NP student

## 2017-09-24 NOTE — Progress Notes (Signed)
Subjective:    Patient ID: Scott Vasquez, male    DOB: 11-10-1940, 77 y.o.   MRN: [id number]  HPI  77 year old male who  has a past medical history of Anemia, Diabetes mellitus, Diverticulosis of colon, Hypertension, Stroke ([place]), and Thyroid nodule. He presents to the office today follow-up regarding uncontrolled diabetes.  His current regimen includes metformin 1000 mg twice daily, glipizide 10 mg twice daily Trulicity 7.54 and Tradjenta 5 mg.  Last A1c in October 2018 was 9.4;unfortunately this was up from 8.9.  He does report that his diet is poor and he does not exercise on a routine basis, if at all   He has been checking his blood sugars and reports no blood sugars below 220. He has not been taking Tradjenta as he thought he was supposed to stop this medication. Reports diet has improved and has cut out sodas, cookies, coffee cakes, and carbs.   He continues to drink sweet tea every other day.    Review of Systems See HPI   Past Medical History:  Diagnosis Date  . Anemia    iron deficiency  . Diabetes mellitus    type II  . Diverticulosis of colon   . Hypertension   . Stroke ([place])    numbness in left hand  . Thyroid nodule     Social History   Socioeconomic History  . Marital status: Married    Spouse name: Not on file  . Number of children: Not on file  . Years of education: Not on file  . Highest education level: Not on file  Social Needs  . Financial resource strain: Not on file  . Food insecurity - worry: Not on file  . Food insecurity - inability: Not on file  . Transportation needs - medical: Not on file  . Transportation needs - non-medical: Not on file  Occupational History  . Occupation: retired    Fish farm manager: [employer]  . Smoking status: Former Smoker    Packs/day: 0.50    Years: 20.00    Pack years: 10.00    Types: Cigarettes    Start date: 30-Oct-1951    Last attempt to quit: 08/10/1958    Years since quitting: 59.1  . Smokeless tobacco:  Never Used  Substance and Sexual Activity  . Alcohol use: No  . Drug use: No  . Sexual activity: Not on file  Other Topics Concern  . Not on file  Social History Narrative   Works at [employer] for 10 years/ works 5 days ; 40 hour week    - Married with two children who live locally   - No pets   - Attend church for fun.    Past Surgical History:  Procedure Laterality Date  . BACK SURGERY    . CATARACT EXTRACTION, BILATERAL    . SHOULDER SURGERY  08/2007   Supple  . THYROID LOBECTOMY Left 04/29/2015  . THYROID LOBECTOMY Left 04/29/2015   Procedure: THYROID LOBECTOMY;  Surgeon: Armandina Gemma, MD;  Location: [facility] OR;  Service: General;  Laterality: Left;    Family History  Problem Relation Age of Onset  . Diabetes Unknown   . Diabetes Father        Died from diabetic coma in 10-29-1970  . Hypertension Father   . Stroke Mother        Died in 11  . Hypertension Mother     No Known Allergies  Current Outpatient Medications on File Prior  to Visit  Medication Sig Dispense Refill  . albuterol (PROAIR HFA) 108 (90 Base) MCG/ACT inhaler Inhale 2 puffs into the lungs every 6 (six) hours as needed for wheezing or shortness of breath. 1 Inhaler 2  . atorvastatin (LIPITOR) 40 MG tablet Take 1 tablet (40 mg total) by mouth daily. 90 tablet 3  . Azelastine-Fluticasone 137-50 MCG/ACT SUSP Place 1 spray into the nose 2 (two) times daily. 23 g 3  . Blood Glucose Monitoring Suppl (ONE TOUCH ULTRA SYSTEM KIT) W/DEVICE KIT Use to check blood sugar twice a day 1 each 0  . clopidogrel (PLAVIX) 75 MG tablet Take 1 tablet (75 mg total) by mouth daily. 90 tablet 3  . cyanocobalamin 100 MCG tablet Take 100 mcg by mouth daily.    Marland Kitchen donepezil (ARICEPT) 23 MG TABS tablet Take 1 tablet (23 mg total) by mouth at bedtime. 90 tablet 3  . glipiZIDE (GLUCOTROL) 5 MG tablet Take 2 tablets by mouth twice daily. 120 tablet 5  . glucose blood (ONE TOUCH ULTRA TEST) test strip USE TO CHECK BLOOD SUGAR TWICE DAILY 100 each  3  . ipratropium (ATROVENT) 0.03 % nasal spray Place 2 sprays into both nostrils every 12 (twelve) hours. 30 mL 12  . levocetirizine (XYZAL) 5 MG tablet TAKE ONE TABLET BY MOUTH EVERY EVENING. 30 tablet 5  . levothyroxine (SYNTHROID, LEVOTHROID) 50 MCG tablet TAKE ONE TABLET BY MOUTH ONCE DAILY 90 tablet 2  . lisinopril (PRINIVIL,ZESTRIL) 40 MG tablet Take 1 tablet (40 mg total) by mouth daily. 90 tablet 3  . meloxicam (MOBIC) 7.5 MG tablet TAKE ONE TABLET BY MOUTH ONCE DAILY. 30 tablet 0  . metFORMIN (GLUCOPHAGE) 500 MG tablet TAKE TWO (2) TABLETS BY MOUTH TWICE DAILY. 360 tablet 0  . ONETOUCH DELICA LANCETS 35K MISC 1 each by Other route See admin instructions. Check blood sugar twice daily  0  . tamsulosin (FLOMAX) 0.4 MG CAPS capsule Take 1 capsule (0.4 mg total) by mouth daily. 90 capsule 3   No current facility-administered medications on file prior to visit.     BP (!) 144/70 (BP Location: Left Arm)   Temp 98.1 F (36.7 C) (Oral)   Wt 202 lb (91.6 kg)   BMI 28.57 kg/m       Objective:   Physical Exam  Constitutional: He is oriented to person, place, and time. He appears well-developed and well-nourished. No distress.  Cardiovascular: Normal rate, regular rhythm and intact distal pulses. Exam reveals no gallop and no friction rub.  Murmur heard. Pulmonary/Chest: Effort normal and breath sounds normal. No respiratory distress. He has no wheezes. He has no rales. He exhibits no tenderness.  Musculoskeletal: Normal range of motion. He exhibits no edema, tenderness or deformity.  Neurological: He is alert and oriented to person, place, and time.  Skin: Skin is warm and dry. No rash noted. He is not diaphoretic. No erythema. No pallor.  Psychiatric: He has a normal mood and affect. His behavior is normal. Judgment and thought content normal.  Nursing note and vitals reviewed.     Assessment & Plan:  1. Diabetes mellitus type 2 with complications, uncontrolled ([place]) - POCT A1C-  10.0  - Will increase Trulicity to 1.5 mg  - Re order Trudjenta  - Continue to work on diet  - Will recheck at [facility] if not improved will send to [place], NP

## 2017-09-24 NOTE — Patient Instructions (Addendum)
I want you to take all your diabetes medications as directed  I have increased Truclicity dose   Please follow up with me three months for your physical

## 2017-10-01 ENCOUNTER — Telehealth: Payer: Self-pay | Admitting: Adult Health

## 2017-10-01 NOTE — Telephone Encounter (Signed)
Pt's wife states that the medication was too expensive at this location and wanted to know if it would be cheaper at [facility].Notified pt's wife  to contact [facility] in order to have medication retrieved from [facility]. Pt's wife verbalized understanding

## 2017-10-01 NOTE — Telephone Encounter (Signed)
Copied from [place] [id number]. Topic: Quick Communication - Rx Refill/Question >> Oct 01, 2017 10:11 AM Robina Ade, Helene Kelp D wrote: Medication:linagliptin (TRADJENTA) 5 MG TABS tablet   Has the patient contacted their pharmacy? Yes  (Agent: If no, request that the patient contact the pharmacy for the refill.)   Preferred Pharmacy (with phone number or street name): [place], [address]   Agent: Please be advised that RX refills may take up to 3 business days. We ask that you follow-up with your pharmacy.

## 2017-10-05 ENCOUNTER — Telehealth: Payer: Self-pay | Admitting: Adult Health

## 2017-10-05 NOTE — Telephone Encounter (Signed)
Copied from [place] [id number]. Topic: Quick Communication - Rx Refill/Question >> Oct 05, 2017 10:23 AM Robina Ade, Helene Kelp D wrote: Medication: linagliptin (TRADJENTA) 5 MG TABS tablet   Has the patient contacted their pharmacy? No. Patient called to say that the med is too much for him and wants something else cheaper call in to his pharmacy.   (Agent: If no, request that the patient contact the pharmacy for the refill.)   Preferred Pharmacy (with phone number or street name): [facility], [address]   Agent: Please be advised that RX refills may take up to 3 business days. We ask that you follow-up with your pharmacy.

## 2017-10-06 NOTE — Telephone Encounter (Signed)
Pt called in to notify us that the insurance company would be contacting Scott Vasquez to notify him of covered medications in this category.

## 2017-10-06 NOTE — Telephone Encounter (Signed)
Scott Vasquez, pt says [name] is too expensive and is requesting a different medication to be called in.  Please advise.

## 2017-10-06 NOTE — Telephone Encounter (Signed)
Left a message for a return call.

## 2017-10-06 NOTE — Telephone Encounter (Signed)
We have tried multiple medications. Please have him call his insurance company and find out what they cover.

## 2017-10-06 NOTE — Telephone Encounter (Signed)
Ok to fill for 90 days

## 2017-10-14 ENCOUNTER — Other Ambulatory Visit: Payer: Self-pay | Admitting: Adult Health

## 2017-10-15 NOTE — Telephone Encounter (Signed)
Sent to the pharmacy by e-scribe for 90 days.  Pt has upcoming appt with [facility] for cpx on 12/24/17.

## 2017-10-18 NOTE — Telephone Encounter (Signed)
Building control surveyor Exception form for [place] received from [facility].  This was completed and faxed to [phone].

## 2017-10-22 ENCOUNTER — Ambulatory Visit: Payer: BLUE CROSS/BLUE SHIELD | Admitting: Cardiovascular Disease

## 2017-10-22 NOTE — Telephone Encounter (Signed)
Spoke to Scott Vasquez and she informed me that she is waiting to hear back about the tier exception.  Will call back when all information is in.

## 2017-10-22 NOTE — Telephone Encounter (Signed)
Left a message for a return call.

## 2017-10-29 NOTE — Telephone Encounter (Signed)
Received paper work.  I do not think Tradjenta will be covered because the pt has not tried and failed Bydureon, Invokana, Lantus, Levemir, Onglyza or Victoza.  Please advise.

## 2017-11-01 ENCOUNTER — Telehealth: Payer: Self-pay | Admitting: Adult Health

## 2017-11-01 NOTE — Telephone Encounter (Signed)
Copied from [place] [id number]. Topic: Quick Communication - See Telephone Encounter >> Nov 01, 2017  3:46 PM Arletha Grippe wrote: CRM for notification. See Telephone encounter for: 11/01/17. Wife carolyn is calling to find out of status for contacting the insurance to get a discount on medication linagliptin (TRADJENTA) 5 MG TABS tablet.  It here is no way to get a discount, the wife is asking for a different medication to be called in.  Please call cell [phone]

## 2017-11-02 NOTE — Telephone Encounter (Signed)
Will wait for prior auth to come back and then make decision on what to do

## 2017-11-02 NOTE — Telephone Encounter (Signed)
Duplicate message.  See encounter from 10/05/17.

## 2017-11-05 MED ORDER — CANAGLIFLOZIN 100 MG PO TABS: 100.0000 mg | ORAL_TABLET | Freq: Every day | ORAL | 0 refills | Status: DC

## 2017-11-05 NOTE — Telephone Encounter (Signed)
I am ok with Invokana 100 mg daily instead of Tradjenta.

## 2017-11-05 NOTE — Telephone Encounter (Signed)
Medication sent to the pharmacy.  Pt notified.

## 2017-12-16 ENCOUNTER — Other Ambulatory Visit: Payer: Self-pay | Admitting: Adult Health

## 2017-12-16 NOTE — Telephone Encounter (Signed)
Ok to send in for 90 days. He has an upcoming CPE

## 2017-12-16 NOTE — Telephone Encounter (Signed)
No lipid panel since 07/31/16.  Please advise.

## 2017-12-16 NOTE — Telephone Encounter (Signed)
Sent to the pharmacy by e-scribe. 

## 2017-12-24 ENCOUNTER — Ambulatory Visit (INDEPENDENT_AMBULATORY_CARE_PROVIDER_SITE_OTHER): Payer: Medicare Other | Admitting: Adult Health

## 2017-12-24 ENCOUNTER — Encounter: Payer: Self-pay | Admitting: Adult Health

## 2017-12-24 VITALS — BP 150/78 | Temp 98.0°F | Ht 69.0 in | Wt 185.0 lb

## 2017-12-24 DIAGNOSIS — E1165 Type 2 diabetes mellitus with hyperglycemia: Secondary | ICD-10-CM

## 2017-12-24 DIAGNOSIS — Z8673 Personal history of transient ischemic attack (TIA), and cerebral infarction without residual deficits: Secondary | ICD-10-CM | POA: Diagnosis not present

## 2017-12-24 DIAGNOSIS — I6381 Other cerebral infarction due to occlusion or stenosis of small artery: Secondary | ICD-10-CM

## 2017-12-24 DIAGNOSIS — N400 Enlarged prostate without lower urinary tract symptoms: Secondary | ICD-10-CM | POA: Diagnosis not present

## 2017-12-24 DIAGNOSIS — E118 Type 2 diabetes mellitus with unspecified complications: Secondary | ICD-10-CM

## 2017-12-24 DIAGNOSIS — I10 Essential (primary) hypertension: Secondary | ICD-10-CM

## 2017-12-24 DIAGNOSIS — R4189 Other symptoms and signs involving cognitive functions and awareness: Secondary | ICD-10-CM

## 2017-12-24 DIAGNOSIS — IMO0002 Reserved for concepts with insufficient information to code with codable children: Secondary | ICD-10-CM

## 2017-12-24 DIAGNOSIS — E538 Deficiency of other specified B group vitamins: Secondary | ICD-10-CM

## 2017-12-24 DIAGNOSIS — E785 Hyperlipidemia, unspecified: Secondary | ICD-10-CM

## 2017-12-24 DIAGNOSIS — Z0000 Encounter for general adult medical examination without abnormal findings: Secondary | ICD-10-CM | POA: Diagnosis not present

## 2017-12-24 LAB — CBC WITH DIFFERENTIAL/PLATELET
Basophils Absolute: 0 10*3/uL (ref 0.0–0.1)
Basophils Relative: 0.5 % (ref 0.0–3.0)
EOS ABS: 0.1 10*3/uL (ref 0.0–0.7)
Eosinophils Relative: 1.7 % (ref 0.0–5.0)
HEMATOCRIT: 37.2 % — AB (ref 39.0–52.0)
HEMOGLOBIN: 12.1 g/dL — AB (ref 13.0–17.0)
LYMPHS PCT: 22.1 % (ref 12.0–46.0)
Lymphs Abs: 1.3 10*3/uL (ref 0.7–4.0)
MCHC: 32.5 g/dL (ref 30.0–36.0)
MCV: 88 fl (ref 78.0–100.0)
MONO ABS: 0.5 10*3/uL (ref 0.1–1.0)
Monocytes Relative: 8 % (ref 3.0–12.0)
NEUTROS PCT: 67.7 % (ref 43.0–77.0)
Neutro Abs: 4.1 10*3/uL (ref 1.4–7.7)
Platelets: 292 10*3/uL (ref 150.0–400.0)
RBC: 4.22 Mil/uL (ref 4.22–5.81)
RDW: 14.3 % (ref 11.5–15.5)
WBC: 6.1 10*3/uL (ref 4.0–10.5)

## 2017-12-24 LAB — LIPID PANEL
CHOL/HDL RATIO: 3
Cholesterol: 119 mg/dL (ref 0–200)
HDL: 46.5 mg/dL (ref >39.00)
LDL Cholesterol: 59 mg/dL (ref 0–99)
NONHDL: 72.06
Triglycerides: 64 mg/dL (ref 0.0–149.0)
VLDL: 12.8 mg/dL (ref 0.0–40.0)

## 2017-12-24 LAB — BASIC METABOLIC PANEL
BUN: 17 mg/dL (ref 6–23)
CO2: 30 mEq/L (ref 19–32)
CREATININE: 1.48 mg/dL (ref 0.40–1.50)
Calcium: 9.5 mg/dL (ref 8.4–10.5)
Chloride: 105 mEq/L (ref 96–112)
GFR: 59.02 mL/min — AB (ref >60.00)
Glucose, Bld: 160 mg/dL — ABNORMAL HIGH (ref 70–99)
Potassium: 4.3 mEq/L (ref 3.5–5.1)
Sodium: 141 mEq/L (ref 135–145)

## 2017-12-24 LAB — HEPATIC FUNCTION PANEL
ALBUMIN: 4 g/dL (ref 3.5–5.2)
ALK PHOS: 131 U/L — AB (ref 39–117)
ALT: 14 U/L (ref 0–53)
AST: 13 U/L (ref 0–37)
BILIRUBIN DIRECT: 0.1 mg/dL (ref 0.0–0.3)
Total Bilirubin: 0.5 mg/dL (ref 0.2–1.2)
Total Protein: 7.2 g/dL (ref 6.0–8.3)

## 2017-12-24 LAB — TSH: TSH: 1.93 u[IU]/mL (ref 0.35–4.50)

## 2017-12-24 LAB — HEMOGLOBIN A1C: HEMOGLOBIN A1C: 8.4 % — AB (ref 4.6–6.5)

## 2017-12-24 LAB — VITAMIN B12: Vitamin B-12: 920 pg/mL — ABNORMAL HIGH (ref 211–911)

## 2017-12-24 LAB — PSA: PSA: 4.19 ng/mL — ABNORMAL HIGH (ref 0.10–4.00)

## 2017-12-24 NOTE — Progress Notes (Signed)
Subjective:    Patient ID: Scott Vasquez, male    DOB: [birth year], 77 y.o.   MRN: [id number]  HPI  Patient presents for yearly preventative medicine examination. He is a pleasant 77 year old male who  has a past medical history of Anemia, Diabetes mellitus, Diverticulosis of colon, Hypertension, Stroke ([place]), and Thyroid nodule. He is by himself today. Is happy to report that he has retired. Since retiring he has been staying busy mowing the yard and doing home improvement.   DM II - - Uncontrolled.  Currently prescribed Invokana, Trulicity, glipizide, metformin.  Does not check his blood sugars on a regular basis when he does he reports readings at home in the 140-150's.  Trying to work on diabetic diet and finds it hard to pick the right food choices.  Lab Results  Component Value Date   HGBA1C 10.0 09/24/2017   Hypertension -currently prescribed lisinopril 40 mg tablets.He did not take his medication this morning.   BP Readings from Last 3 Encounters:  12/24/17 (!) 150/78  09/24/17 (!) 144/70  08/26/17 (!) 106/54   Hypothyroidism/ s/p thyroid lobectomy 2016 -controlled with Synthroid 50 mcg  Hyperlipidemia -Currently controlled with Lipitor 40 mg daily Lab Results  Component Value Date   CHOL 126 07/31/2016   HDL 53.10 07/31/2016   LDLCALC 61 07/31/2016   TRIG 59.0 07/31/2016   CHOLHDL 2 07/31/2016   Cognitive Impairment - Takes Aricept 23 mg. Report that he feels as though cognitive impairment is stable. Has not been misplacing items around the house nor has he become lost while driving  BPH - Controlled with Flomax   S/p CVA - currently prescribed Plavix. Denies any headaches, blurred vision, or recent falls.    All immunizations and health maintenance protocols were reviewed with the patient and needed orders were placed. He is UTD on vaccinations   Appropriate screening laboratory values were ordered for the patient including screening of hyperlipidemia, renal  function and hepatic function. If indicated by BPH, a PSA was ordered.  Medication reconciliation,  past medical history, social history, problem list and allergies were reviewed in detail with the patient  Goals were established with regard to weight loss, exercise, and  diet in compliance with medications  End of life planning was discussed.  He has no acute complaints.   Review of Systems  Constitutional: Negative.   HENT: Positive for postnasal drip and rhinorrhea.   Eyes: Negative.   Respiratory: Negative.   Cardiovascular: Negative.   Gastrointestinal: Negative.   Endocrine: Negative.   Genitourinary: Negative.   Musculoskeletal: Negative.   Skin: Negative.   Allergic/Immunologic: Negative.   Neurological: Negative.   Hematological: Negative.   Psychiatric/Behavioral: Negative.   All other systems reviewed and are negative.  Past Medical History:  Diagnosis Date  . Anemia    iron deficiency  . Diabetes mellitus    type II  . Diverticulosis of colon   . Hypertension   . Stroke ([place])    numbness in left hand  . Thyroid nodule     Social History   Socioeconomic History  . Marital status: Married    Spouse name: Not on file  . Number of children: Not on file  . Years of education: Not on file  . Highest education level: Not on file  Occupational History  . Occupation: retired    Fish farm manager: [employer]  . Financial resource strain: Not on file  . Food insecurity:    Worry:  Not on file    Inability: Not on file  . Transportation needs:    Medical: Not on file    Non-medical: Not on file  Tobacco Use  . Smoking status: Former Smoker    Packs/day: 0.50    Years: 20.00    Pack years: 10.00    Types: Cigarettes    Start date: 10-21-1953    Last attempt to quit: 08/10/1958    Years since quitting: 59.4  . Smokeless tobacco: Never Used  Substance and Sexual Activity  . Alcohol use: No  . Drug use: No  . Sexual activity: Not on file  Lifestyle  .  Physical activity:    Days per week: Not on file    Minutes per session: Not on file  . Stress: Not on file  Relationships  . Social connections:    Talks on phone: Not on file    Gets together: Not on file    Attends religious service: Not on file    Active member of club or organization: Not on file    Attends meetings of clubs or organizations: Not on file    Relationship status: Not on file  . Intimate partner violence:    Fear of current or ex partner: Not on file    Emotionally abused: Not on file    Physically abused: Not on file    Forced sexual activity: Not on file  Other Topics Concern  . Not on file  Social History Narrative   Works at [employer] for 10 years/ works 5 days ; 40 hour week    - Married with two children who live locally   - No pets   - Attend church for fun.    Past Surgical History:  Procedure Laterality Date  . BACK SURGERY    . CATARACT EXTRACTION, BILATERAL    . SHOULDER SURGERY  08/2007   Supple  . THYROID LOBECTOMY Left 04/29/2015  . THYROID LOBECTOMY Left 04/29/2015   Procedure: THYROID LOBECTOMY;  Surgeon: Armandina Gemma, MD;  Location: [facility] OR;  Service: General;  Laterality: Left;    Family History  Problem Relation Age of Onset  . Diabetes Unknown   . Diabetes Father        Died from diabetic coma in 10-20-1970  . Hypertension Father   . Stroke Mother        Died in 78  . Hypertension Mother     No Known Allergies  Current Outpatient Medications on File Prior to Visit  Medication Sig Dispense Refill  . albuterol (PROAIR HFA) 108 (90 Base) MCG/ACT inhaler Inhale 2 puffs into the lungs every 6 (six) hours as needed for wheezing or shortness of breath. 1 Inhaler 2  . atorvastatin (LIPITOR) 40 MG tablet TAKE 1 TABLET BY MOUTH ONCE DAILY 90 tablet 0  . Azelastine-Fluticasone 137-50 MCG/ACT SUSP Place 1 spray into the nose 2 (two) times daily. 23 g 3  . Blood Glucose Monitoring Suppl (ONE TOUCH ULTRA SYSTEM KIT) W/DEVICE KIT Use to check blood  sugar twice a day 1 each 0  . canagliflozin (INVOKANA) 100 MG TABS tablet Take 1 tablet (100 mg total) by mouth daily before breakfast. 90 tablet 0  . clopidogrel (PLAVIX) 75 MG tablet Take 1 tablet (75 mg total) by mouth daily. 90 tablet 3  . cyanocobalamin 100 MCG tablet Take 100 mcg by mouth daily.    Marland Kitchen donepezil (ARICEPT) 23 MG TABS tablet Take 1 tablet (23 mg total) by mouth at  bedtime. 90 tablet 3  . Dulaglutide (TRULICITY) 1.5 NA/3.5TD SOPN Inject 1.5 mg into the skin once a week. 4 pen 2  . glipiZIDE (GLUCOTROL) 5 MG tablet Take 2 tablets by mouth twice daily. 120 tablet 5  . glucose blood (ONE TOUCH ULTRA TEST) test strip USE TO CHECK BLOOD SUGAR TWICE DAILY 100 each 3  . ipratropium (ATROVENT) 0.03 % nasal spray Place 2 sprays into both nostrils every 12 (twelve) hours. 30 mL 12  . levocetirizine (XYZAL) 5 MG tablet TAKE ONE TABLET BY MOUTH EVERY EVENING. 30 tablet 5  . levothyroxine (SYNTHROID, LEVOTHROID) 50 MCG tablet TAKE ONE TABLET BY MOUTH ONCE DAILY 90 tablet 2  . lisinopril (PRINIVIL,ZESTRIL) 40 MG tablet Take 1 tablet (40 mg total) by mouth daily. 90 tablet 3  . lisinopril (PRINIVIL,ZESTRIL) 40 MG tablet TAKE ONE TABLET BY MOUTH DAILY. 90 tablet 0  . meloxicam (MOBIC) 7.5 MG tablet TAKE ONE TABLET BY MOUTH ONCE DAILY. 30 tablet 0  . metFORMIN (GLUCOPHAGE) 500 MG tablet TAKE TWO (2) TABLETS BY MOUTH TWICE DAILY. 360 tablet 0  . ONETOUCH DELICA LANCETS 32K MISC 1 each by Other route See admin instructions. Check blood sugar twice daily  0  . tamsulosin (FLOMAX) 0.4 MG CAPS capsule Take 1 capsule (0.4 mg total) by mouth daily. 90 capsule 3   No current facility-administered medications on file prior to visit.     BP (!) 150/78   Temp 98 F (36.7 C)   Ht 5' 9"  (1.753 m)   Wt 185 lb (83.9 kg)   BMI 27.32 kg/m       Objective:   Physical Exam  Constitutional: He is oriented to person, place, and time. He appears well-developed and well-nourished. No distress.  HENT:    Head: Normocephalic and atraumatic.  Right Ear: External ear normal.  Left Ear: External ear normal.  Nose: Rhinorrhea (clear) present.  Mouth/Throat: Oropharynx is clear and moist. No oropharyngeal exudate.  Eyes: Pupils are equal, round, and reactive to light. Conjunctivae and EOM are normal. Right eye exhibits no discharge. Left eye exhibits no discharge. No scleral icterus.  Neck: Normal range of motion. Neck supple. No JVD present. No tracheal deviation present. No thyromegaly present.  Cardiovascular: Normal rate, regular rhythm, normal heart sounds and intact distal pulses. Exam reveals no gallop and no friction rub.  No murmur heard. Pulmonary/Chest: Effort normal and breath sounds normal. No stridor. No respiratory distress. He has no wheezes. He has no rales. He exhibits no tenderness.  Abdominal: Soft. Bowel sounds are normal. He exhibits no distension and no mass. There is no tenderness. There is no rebound and no guarding. No hernia.  Musculoskeletal: Normal range of motion. He exhibits no edema, tenderness or deformity.  Lymphadenopathy:    He has no cervical adenopathy.  Neurological: He is alert and oriented to person, place, and time.  Skin: Skin is warm and dry. Capillary refill takes less than 2 seconds. He is not diaphoretic.  Psychiatric: He has a normal mood and affect. His behavior is normal. Judgment and thought content normal.  Nursing note and vitals reviewed.     Assessment & Plan:  1. Routine general medical examination at a health care facility - One year follow up for CPE or sooner if needed - Basic metabolic panel - CBC with Differential/Platelet - Hemoglobin A1c - Hepatic function panel - Lipid panel - TSH  2. Hyperlipidemia, unspecified hyperlipidemia type - Consider increase in statin - Basic metabolic panel -  CBC with Differential/Platelet - Hemoglobin A1c - Hepatic function panel - Lipid panel - TSH  3. Diabetes mellitus type 2 with  complications, uncontrolled ([place]) - Encouraged diabetic diet  - Follow up in 3 months or sooner if needed - Basic metabolic panel - CBC with Differential/Platelet - Hemoglobin A1c - Hepatic function panel - Lipid panel - TSH  4. Essential hypertension - Not at goal today but he did not take his medications.  - Continue to monitor  - Basic metabolic panel - CBC with Differential/Platelet - Hemoglobin A1c - Hepatic function panel - Lipid panel - TSH  5. Cognitive impairment - Continue with Aricept 23 mg   6. Acute lacunar stroke (H[facility])  - Basic metabolic panel - CBC with Differential/Platelet - Hemoglobin A1c - Hepatic function panel - Lipid panel - TSH  7. B12 deficiency  - Vitamin B12  8. Benign prostatic hyperplasia without lower urinary tract symptoms  - PSA   [name], NP

## 2017-12-29 ENCOUNTER — Encounter: Payer: Self-pay | Admitting: Family Medicine

## 2018-01-04 ENCOUNTER — Other Ambulatory Visit: Payer: Self-pay | Admitting: Adult Health

## 2018-01-04 NOTE — Telephone Encounter (Signed)
Sent to the pharmacy by e-scribe. 

## 2018-01-17 ENCOUNTER — Other Ambulatory Visit: Payer: Self-pay | Admitting: Adult Health

## 2018-01-18 NOTE — Telephone Encounter (Signed)
Sent to the pharmacy by e-scribe. 

## 2018-01-21 ENCOUNTER — Other Ambulatory Visit: Payer: Self-pay | Admitting: Adult Health

## 2018-01-21 NOTE — Telephone Encounter (Signed)
DENIED.  REFILL REQUEST TOO EARLY.  FILLED FOR 9 MONTHS IN DEC 2018.    This prescription was filled on 01/21/2018. Any refills authorized will be placed on file. (MESSAGE SENT FROM PHARMACY)

## 2018-02-01 ENCOUNTER — Other Ambulatory Visit: Payer: Self-pay | Admitting: Adult Health

## 2018-02-01 IMAGING — NM NM MYOCAR MULTI W/SPECT W/WALL MOTION & EF
2 series · 12 of 12 positions shown · non-contrast
Comparison: none

[Series 1: rest · 6.51mm/px · 6 of 64 frames shown]
[frame 6/64]
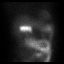
[frame 16/64]
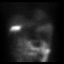
[frame 27/64]
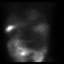
[frame 38/64]
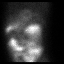
[frame 48/64]
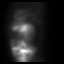
[frame 59/64]
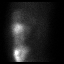

[Series 3: stress gated - perfusion · 6.51mm/px · 6 of 64 frames shown]
[frame 6/64]
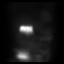
[frame 16/64]
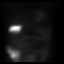
[frame 27/64]
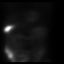
[frame 38/64]
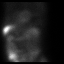
[frame 48/64]
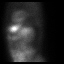
[frame 59/64]
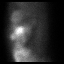

[12 of 12 positions shown; findings below may reference images not displayed]

Canned report from images found in remote index.

Refer to host system for actual result text.

## 2018-02-02 NOTE — Telephone Encounter (Signed)
Sent to the pharmacy by e-scribe for 90 days.  Pt has follow up on 03/31/18.

## 2018-02-08 ENCOUNTER — Telehealth: Payer: Self-pay | Admitting: *Deleted

## 2018-02-08 LAB — PULMONARY FUNCTION TEST
DL/VA % pred: 98 %
DL/VA: 4.56 ml/min/mmHg/L
DLCO COR: 18.91 ml/min/mmHg
DLCO UNC: 18.35 ml/min/mmHg
DLCO cor % pred: 57 %
DLCO unc % pred: 55 %
FEF 25-75 POST: 2.73 L/s
FEF 25-75 Pre: 3.01 L/sec
FEF2575-%Change-Post: -9 %
FEF2575-%PRED-PRE: 137 %
FEF2575-%Pred-Post: 124 %
FEV1-%Change-Post: -2 %
FEV1-%PRED-POST: 68 %
FEV1-%PRED-PRE: 69 %
FEV1-POST: 1.88 L
FEV1-Pre: 1.92 L
FEV1FVC-%Change-Post: -1 %
FEV1FVC-%Pred-Pre: 121 %
FEV6-%CHANGE-POST: 0 %
FEV6-%PRED-PRE: 58 %
FEV6-%Pred-Post: 58 %
FEV6-POST: 2.1 L
FEV6-Pre: 2.1 L
FEV6FVC-%PRED-POST: 105 %
FEV6FVC-%PRED-PRE: 105 %
FVC-%CHANGE-POST: 0 %
FVC-%Pred-Post: 55 %
FVC-%Pred-Pre: 56 %
FVC-POST: 2.1 L
FVC-Pre: 2.11 L
POST FEV6/FVC RATIO: 100 %
PRE FEV1/FVC RATIO: 91 %
Post FEV1/FVC ratio: 90 %
Pre FEV6/FVC Ratio: 100 %
RV % pred: 94 %
RV: 2.52 L
TLC % PRED: 67 %
TLC: 4.8 L

## 2018-02-08 NOTE — Telephone Encounter (Signed)
Left a message informing the pt that he has samples here at the office.  Located in the FP pod.

## 2018-02-08 NOTE — Telephone Encounter (Signed)
Copied from [place] [phone] [phone]. Topic: General - Other >> Feb 08, 2018  9:51 AM Keene Breath wrote: Reason for CRM: Patient called to let doctor know that he was in a donut hole for his medications and wanted to know if he had any samples of TRULICITY 1.5 AT/3.5LR SOPN and INVOKANA 100 MG TABS tablet.  Please advise.  CB# [phone].

## 2018-02-16 DIAGNOSIS — H40053 Ocular hypertension, bilateral: Secondary | ICD-10-CM | POA: Diagnosis not present

## 2018-02-16 DIAGNOSIS — E119 Type 2 diabetes mellitus without complications: Secondary | ICD-10-CM | POA: Diagnosis not present

## 2018-02-16 DIAGNOSIS — Z961 Presence of intraocular lens: Secondary | ICD-10-CM | POA: Diagnosis not present

## 2018-02-16 LAB — HM DIABETES EYE EXAM

## 2018-02-17 DIAGNOSIS — E0789 Other specified disorders of thyroid: Secondary | ICD-10-CM | POA: Diagnosis not present

## 2018-02-17 DIAGNOSIS — Z9889 Other specified postprocedural states: Secondary | ICD-10-CM | POA: Diagnosis not present

## 2018-03-01 ENCOUNTER — Encounter: Payer: Self-pay | Admitting: Family Medicine

## 2018-03-02 ENCOUNTER — Other Ambulatory Visit: Payer: Self-pay

## 2018-03-02 NOTE — Patient Outreach (Signed)
[name]) Care Management  03/02/2018  Scott Vasquez [birth year] [id number]   Medication Adherence call to Scott Vasquez Scale patient is getting samples from the doctor on [place]  because he is in the donut hole patient also ask if there was any samples for Invokana 100 mg they said it is very expensive and can not afford I will refer to [name] the pharmacist to help them with patient assistance.  [name] Management Direct Dial [phone]  Fax [phone] [name]

## 2018-03-02 NOTE — Patient Outreach (Signed)
[place] [facility]) Care Management  03/02/2018  Kalyn Dimattia Sainvil Nov 03, 1940 [id number]  77 year old male outreached by [place] services for medication adherence.  PMHx includes, but not limited to, hypertension, stroke, Type 2 diabetes mellitus and hyperlipidemia.  Successful outreach attempt to Mr. Argueta wife, Rollene Fare.  HIPAA identifiers verified.   Medication Management: Mrs. Crisco reports that Mr. Ferrelli cannot afford his Trulicity or Invokana.  She states that  he has one more injection of his Trulicity and is out of Invokana.   Per final discussion, Mr. Schmieder does not qualify for Extra Help LIS.   He will need to spend $1100 to qualify for Trulicity patient assistance from [place] and 4% of household income (~ $1050) to qualify for Invokana assistance from [employer].  Requested that Mr. Severe get a printout from his pharmacies to determine his out of pocket (OOP).  Mrs. Nawaz states that she will have him get his printouts and call me back.  Plan: Route not to PCP, to see if any samples available.   Await call back from Mr. Kobus with his OOP for 2019.  Joetta Manners, PharmD Clinical Pharmacist [place] [phone]

## 2018-03-03 ENCOUNTER — Other Ambulatory Visit: Payer: Self-pay

## 2018-03-03 NOTE — Patient Outreach (Signed)
[place] [facility]) Care Management  03/03/2018  Ramiz Turpin [place]ton 04/28/1941 [id number]  In-basket message received from Harless Litten NP that they have some Trulicity samples for Mr. Chait.  They do not have any Invokana samples.    Unsuccessful outreach attempt to Mr. Rager to relay above information.  Left HIPAA compliant voice message requesting a return call.  Joetta Manners, PharmD Clinical Pharmacist [place] [phone]

## 2018-03-07 ENCOUNTER — Other Ambulatory Visit: Payer: Self-pay

## 2018-03-07 NOTE — Patient Outreach (Signed)
[place] [facility]) Care Management  03/07/2018  Scott Vasquez 02-02-1941 [id number]   Successful outreach attempt to Scott Vasquez.  HIPAA identifiers verified.   Informed Scott Vasquez that his NP, [name], had some Trulcity samples for him to pick up.  He states that he has not gotten his print out from his pharmacy with his out of pocket expenditures yet.  Encouraged him to get this print out and then give me a call.     Plan: Follow up in 1 week to see if eligible to apply for Trulicity and Invokona.   Joetta Manners, PharmD Clinical Pharmacist [place] [phone]

## 2018-03-17 ENCOUNTER — Ambulatory Visit: Payer: Medicare Other

## 2018-03-17 ENCOUNTER — Other Ambulatory Visit: Payer: Self-pay

## 2018-03-17 ENCOUNTER — Other Ambulatory Visit: Payer: Self-pay | Admitting: Adult Health

## 2018-03-17 NOTE — Telephone Encounter (Signed)
Sent to the pharmacy by e-scribe. 

## 2018-03-17 NOTE — Patient Outreach (Signed)
[place] [facility] Care Management  03/17/2018  Scott Vasquez [birth year] [id number]  77 year old male outreached by [place] services for medication adherence.  PMHx includes, but not limited to, hypertension, stroke, Type 2 diabetes mellitus and hyperlipidemia.  Unsuccessful outreach attempt to [name].  Left HIPAA compliant voice message requesting a return call.    Plan: Outreach attempt in 3-4 business days to see if he picked up office samples and got his [place] expenditure.  Joetta Manners, PharmD Clinical Pharmacist [place] [phone]

## 2018-03-21 ENCOUNTER — Other Ambulatory Visit: Payer: Self-pay

## 2018-03-21 ENCOUNTER — Ambulatory Visit: Payer: Self-pay

## 2018-03-21 ENCOUNTER — Other Ambulatory Visit: Payer: Self-pay | Admitting: Pharmacy Technician

## 2018-03-21 NOTE — Patient Outreach (Signed)
[place] [facility]) Care Management  03/21/2018  Scott Vasquez 02/09/1941 [id number]   Received [place] patient assistance referral from [place] for [employer] and [employer]. Prepared patient portion of applications to be mailed and faxed provider portion of applications to C. Nafziger.  Will follow up with patient in 7-10 business days to confirm applications have been received.  [name], [name] Management [phone]

## 2018-03-21 NOTE — Patient Outreach (Signed)
[place] [facility]) Care Management  03/21/2018  Manny Vitolo Bellis 1941/04/29 [id number]  77year old male outreached [place] services for medication adherence. PMHx includes, but not limited to, hypertension, stroke, Type 2 diabetes mellitus and hyperlipidemia.  Unsuccessful outreach attempt to Mr. Machuca.  Left HIPAA compliant voice message requesting a return call.    Successful outreach attempt to Mr. Ferns this afternoon.  HIPAA identifiers verified.   Medication Assistance: Per [facility], Mr. [place] has a prescription out of pocket (OOP) of [phone] and is in the coverage gap.  Based on this amount, his should be eligible for assistance.  Will begin the patient assistance application process for Invokana from [employer] and [employer] for [employer].    Plan: Route letter to CPhT, Etter Sjogren to begin the patient application process.   Joetta Manners, PharmD Clinical Pharmacist [place] [phone]

## 2018-03-31 ENCOUNTER — Ambulatory Visit (INDEPENDENT_AMBULATORY_CARE_PROVIDER_SITE_OTHER): Payer: Medicare Other | Admitting: Adult Health

## 2018-03-31 ENCOUNTER — Encounter: Payer: Self-pay | Admitting: Adult Health

## 2018-03-31 VITALS — BP 112/60 | HR 74 | Temp 98.4°F | Wt 187.1 lb

## 2018-03-31 DIAGNOSIS — I10 Essential (primary) hypertension: Secondary | ICD-10-CM | POA: Diagnosis not present

## 2018-03-31 DIAGNOSIS — E118 Type 2 diabetes mellitus with unspecified complications: Secondary | ICD-10-CM | POA: Diagnosis not present

## 2018-03-31 DIAGNOSIS — E1165 Type 2 diabetes mellitus with hyperglycemia: Secondary | ICD-10-CM | POA: Diagnosis not present

## 2018-03-31 DIAGNOSIS — IMO0002 Reserved for concepts with insufficient information to code with codable children: Secondary | ICD-10-CM

## 2018-03-31 LAB — POCT GLYCOSYLATED HEMOGLOBIN (HGB A1C): HbA1c POC (<> result, manual entry): 9.2 % (ref 4.0–5.6)

## 2018-03-31 MED ORDER — GLIPIZIDE 10 MG PO TABS: 10.0000 mg | ORAL_TABLET | Freq: Two times a day (BID) | ORAL | 0 refills | Status: DC

## 2018-03-31 MED ORDER — LISINOPRIL 40 MG PO TABS: 40.0000 mg | ORAL_TABLET | Freq: Every day | ORAL | 3 refills | Status: DC

## 2018-03-31 MED ORDER — METFORMIN HCL 1000 MG PO TABS: 1000.0000 mg | ORAL_TABLET | Freq: Two times a day (BID) | ORAL | 0 refills | Status: DC

## 2018-03-31 NOTE — Progress Notes (Signed)
Subjective:    Patient ID: Scott Vasquez, male    DOB: Dec 19, 1940, 77 y.o.   MRN: [id number]  HPI  77 year old male who  has a past medical history of Anemia, Diabetes mellitus, Diverticulosis of colon, Hypertension, Stroke ([place]), and Thyroid nodule.  He presents to the office today for follow up regarding diabetes mellitus. He is currently maintained on Glipizide 10 mg BID, Invokana 100 mg, Metformin 4174 mg BID, and Trulicity 1.5 mg   He is currently awaiting to find out if he is eligible for patient assistance of Invokana and Trulicity. His current out of pocket prescription cost for medications is 1776.95. He cannot afford Invokana and Trulicity.   He has been taking Glipizide and Metformin.   He has not been monitoring his diet at home and has not been exercising on a routine basis.    His fasting blood sugar this morning was 222. He reports that most of his blood sugars at home have been " in the 100's".   He denies any blurred vision, headaches, polydipsia or polyuria.   Lab Results  Component Value Date   HGBA1C 8.4 (H) 12/24/2017    Review of Systems See HPI   Past Medical History:  Diagnosis Date  . Anemia    iron deficiency  . Diabetes mellitus    type II  . Diverticulosis of colon   . Hypertension   . Stroke ([place])    numbness in left hand  . Thyroid nodule     Social History   Socioeconomic History  . Marital status: Married    Spouse name: Not on file  . Number of children: Not on file  . Years of education: Not on file  . Highest education level: Not on file  Occupational History  . Occupation: retired    Fish farm manager: [employer]  . Financial resource strain: Not on file  . Food insecurity:    Worry: Not on file    Inability: Not on file  . Transportation needs:    Medical: Not on file    Non-medical: Not on file  Tobacco Use  . Smoking status: Former Smoker    Packs/day: 0.50    Years: 20.00    Pack years: 10.00    Types:  Cigarettes    Start date: 1953    Last attempt to quit: 08/10/1958    Years since quitting: 59.6  . Smokeless tobacco: Never Used  Substance and Sexual Activity  . Alcohol use: No  . Drug use: No  . Sexual activity: Not on file  Lifestyle  . Physical activity:    Days per week: Not on file    Minutes per session: Not on file  . Stress: Not on file  Relationships  . Social connections:    Talks on phone: Not on file    Gets together: Not on file    Attends religious service: Not on file    Active member of club or organization: Not on file    Attends meetings of clubs or organizations: Not on file    Relationship status: Not on file  . Intimate partner violence:    Fear of current or ex partner: Not on file    Emotionally abused: Not on file    Physically abused: Not on file    Forced sexual activity: Not on file  Other Topics Concern  . Not on file  Social History Narrative   Works at [employer] for [employer]  years/ works 5 days ; 40 hour week    - Married with two children who live locally   - No pets   - Attend church for fun.    Past Surgical History:  Procedure Laterality Date  . BACK SURGERY    . CATARACT EXTRACTION, BILATERAL    . SHOULDER SURGERY  08/2007   Supple  . THYROID LOBECTOMY Left 04/29/2015  . THYROID LOBECTOMY Left 04/29/2015   Procedure: THYROID LOBECTOMY;  Surgeon: Armandina Gemma, MD;  Location: [facility] OR;  Service: General;  Laterality: Left;    Family History  Problem Relation Age of Onset  . Diabetes Unknown   . Diabetes Father        Died from diabetic coma in Oct 23, 1970  . Hypertension Father   . Stroke Mother        Died in 54  . Hypertension Mother     No Known Allergies  Current Outpatient Medications on File Prior to Visit  Medication Sig Dispense Refill  . albuterol (PROAIR HFA) 108 (90 Base) MCG/ACT inhaler Inhale 2 puffs into the lungs every 6 (six) hours as needed for wheezing or shortness of breath. 1 Inhaler 2  . atorvastatin (LIPITOR) 40 MG  tablet TAKE 1 TABLET BY MOUTH ONCE DAILY 90 tablet 2  . Azelastine-Fluticasone 137-50 MCG/ACT SUSP Place 1 spray into the nose 2 (two) times daily. 23 g 3  . Blood Glucose Monitoring Suppl (ONE TOUCH ULTRA SYSTEM KIT) W/DEVICE KIT Use to check blood sugar twice a day 1 each 0  . clopidogrel (PLAVIX) 75 MG tablet Take 1 tablet (75 mg total) by mouth daily. 90 tablet 3  . cyanocobalamin 100 MCG tablet Take 100 mcg by mouth daily.    Marland Kitchen donepezil (ARICEPT) 23 MG TABS tablet TAKE ONE TABLET BY MOUTH AT BEDTIME 90 tablet 3  . glipiZIDE (GLUCOTROL) 5 MG tablet Take 2 tablets by mouth twice daily. 120 tablet 5  . glucose blood (ONE TOUCH ULTRA TEST) test strip USE TO CHECK BLOOD SUGAR TWICE DAILY 100 each 3  . INVOKANA 100 MG TABS tablet TAKE 1 TABLET BY MOUTH ONCE DAILY BEFORE BREAKFAST 90 tablet 0  . ipratropium (ATROVENT) 0.03 % nasal spray Place 2 sprays into both nostrils every 12 (twelve) hours. 30 mL 12  . levocetirizine (XYZAL) 5 MG tablet TAKE ONE TABLET BY MOUTH EVERY EVENING. 30 tablet 5  . levothyroxine (SYNTHROID, LEVOTHROID) 50 MCG tablet TAKE ONE TABLET BY MOUTH ONCE DAILY 90 tablet 2  . lisinopril (PRINIVIL,ZESTRIL) 40 MG tablet Take 1 tablet (40 mg total) by mouth daily. 90 tablet 3  . meloxicam (MOBIC) 7.5 MG tablet TAKE ONE TABLET BY MOUTH ONCE DAILY. 30 tablet 0  . metFORMIN (GLUCOPHAGE) 500 MG tablet TAKE TWO (2) TABLETS BY MOUTH TWICE DAILY. 360 tablet 0  . ONETOUCH DELICA LANCETS 63F MISC 1 each by Other route See admin instructions. Check blood sugar twice daily  0  . tamsulosin (FLOMAX) 0.4 MG CAPS capsule Take 1 capsule (0.4 mg total) by mouth daily. 90 capsule 3  . TRULICITY 1.5 HL/4.5GY SOPN INJECT 1 DOSE SUBCUTANEOUSLY ONCE A WEEK 12 pen 0   No current facility-administered medications on file prior to visit.     BP 112/60 (BP Location: Right Arm, Patient Position: Sitting, Cuff Size: Normal)   Pulse 74   Temp 98.4 F (36.9 C) (Oral)   Wt 187 lb 2 oz (84.9 kg)   SpO2  98%   BMI 27.63 kg/m  Objective:   Physical Exam  Constitutional: He is oriented to person, place, and time. He appears well-developed and well-nourished. No distress.  Cardiovascular: Normal rate, regular rhythm, normal heart sounds and intact distal pulses.  Pulmonary/Chest: Effort normal and breath sounds normal.  Neurological: He is alert and oriented to person, place, and time.  Skin: Skin is warm and dry. Capillary refill takes less than 2 seconds. He is not diaphoretic.  Psychiatric: He has a normal mood and affect. His behavior is normal. Judgment and thought content normal.  Nursing note and vitals reviewed.     Assessment & Plan:  1. Diabetes mellitus type 2 with complications, uncontrolled ([place]) - I am unsure if he is actually taking any medication. He is not 100% sure he is taking Metformin and Glipzide and it looks like he is due for refills. Will refill these medications. Gave one month of samples of Trulicity. I would hope we can get him on assistance programs before next visit.  - Advised to work on diet. Less sweet tea and more water.  - Follow up in 3 months  - POC HgB A1c- 9.2 has increased - metFORMIN (GLUCOPHAGE) 1000 MG tablet; Take 1 tablet (1,000 mg total) by mouth 2 (two) times daily with a meal.  Dispense: 180 tablet; Refill: 0 - glipiZIDE (GLUCOTROL) 10 MG tablet; Take 1 tablet (10 mg total) by mouth 2 (two) times daily before a meal. Take 2 tablets by mouth twice daily.  Dispense: 180 tablet; Refill: 0  2. Essential hypertension  - lisinopril (PRINIVIL,ZESTRIL) 40 MG tablet; Take 1 tablet (40 mg total) by mouth daily.  Dispense: 90 tablet; Refill: 3  [name], NP

## 2018-04-05 ENCOUNTER — Ambulatory Visit: Payer: Self-pay | Admitting: Adult Health

## 2018-04-05 ENCOUNTER — Other Ambulatory Visit: Payer: Self-pay | Admitting: Pharmacy Technician

## 2018-04-05 NOTE — Patient Outreach (Signed)
[facility]) Care Management  04/05/2018  Scott Vasquez 1940-12-03 [id number]   Received patient portions of patient assistance applications. Faxed completed applications and required documents into [employer] (Xarelto) and [employer] (Trulicity).  Will follow up with companies in 7-10 business days to check status of applications.  Maud Deed [place], [place] Management [phone]

## 2018-04-05 NOTE — Telephone Encounter (Signed)
Returned call to pt.; spoke with wife per verbal consent by the pt.  Reported pt. has had 3-4 diarrhea stools since 03/31/18, after starting Metformin.  Stated his stools are "loose."  Is having some incontinence of stool, due to the loose consistency.  Reported he has taken Metformin in the past, and did not tolerate it, then.  Stated pt. has had increased weakness with ongoing diarrhea. Questioned about his hydration status; reported "he drinks a lot of tea."  Denied any decreased urine output, dry mouth, or dizziness.  Denied fever/ chills, abdominal pain, nausea, or vomiting.  Denied blood in stool.  Scheduled an appt. with PCP tomorrow, 8/28, @ 11:00 AM.  Care advice given per protocol; wife verb. understanding.  Encouraged to call back with worsening in symptoms.     Reason for Disposition . [1] MODERATE diarrhea (e.g., 4-6 times / day more than normal) AND [2] age > 70 years  Answer Assessment - Initial Assessment Questions 1. DIARRHEA SEVERITY: "How bad is the diarrhea?" "How many extra stools have you had in the past 24 hours than normal?"    - NO DIARRHEA (SCALE 0)   - MILD (SCALE 1-3): Few loose or mushy BMs; increase of 1-3 stools over normal daily number of stools; mild increase in ostomy output.   -  MODERATE (SCALE 4-7): Increase of 4-6 stools daily over normal; moderate increase in ostomy output. * SEVERE (SCALE 8-10; OR 'WORST POSSIBLE'): Increase of 7 or more stools daily over normal; moderate increase in ostomy output; incontinence.     3-4 per day 2. ONSET: "When did the diarrhea begin?"      03/31/18 3. BM CONSISTENCY: "How loose or watery is the diarrhea?"      Watery 4. VOMITING: "Are you also vomiting?" If so, ask: "How many times in the past 24 hours?"     Denied 5. ABDOMINAL PAIN: "Are you having any abdominal pain?" If yes: "What does it feel like?" (e.g., crampy, dull, intermittent, constant)      Denied 6. ABDOMINAL PAIN SEVERITY: If present, ask: "How bad is the  pain?"  (e.g., Scale 1-10; mild, moderate, or severe)   - MILD (1-3): doesn't interfere with normal activities, abdomen soft and not tender to touch    - MODERATE (4-7): interferes with normal activities or awakens from sleep, tender to touch    - SEVERE (8-10): excruciating pain, doubled over, unable to do any normal activities       denied 7. ORAL INTAKE: If vomiting, "Have you been able to drink liquids?" "How much fluids have you had in the past 24 hours?"     Drinks a lot of tea  8. HYDRATION: "Any signs of dehydration?" (e.g., dry mouth [not just dry lips], too weak to stand, dizziness, new weight loss) "When did you last urinate?"     C/o Increased weakness;  denied dizziness , dry mouth, or decreased urine output 9. EXPOSURE: "Have you traveled to a foreign country recently?" "Have you been exposed to anyone with diarrhea?" "Could you have eaten any food that was spoiled?"     Denied 10. ANTIBIOTIC USE: "Are you taking antibiotics now or have you taken antibiotics in the past 2 months?"       Denied  11. OTHER SYMPTOMS: "Do you have any other symptoms?" (e.g., fever, blood in stool)       Denied fever / chills; some incontinence of stool  Protocols used: DIARRHEA-A-AH  Message from Gardiner Ramus sent at  04/05/2018 2:46 PM EDT   Pt wife called and stated that her husband has been in the bathroom since 04/01/18 with diarrhea. Pt has stated metFORMIN and believes this is the problem

## 2018-04-06 ENCOUNTER — Other Ambulatory Visit: Payer: Self-pay

## 2018-04-06 ENCOUNTER — Other Ambulatory Visit: Payer: Self-pay | Admitting: Adult Health

## 2018-04-06 ENCOUNTER — Ambulatory Visit (INDEPENDENT_AMBULATORY_CARE_PROVIDER_SITE_OTHER): Payer: Medicare Other | Admitting: Adult Health

## 2018-04-06 VITALS — BP 90/40 | Temp 98.0°F

## 2018-04-06 DIAGNOSIS — E118 Type 2 diabetes mellitus with unspecified complications: Secondary | ICD-10-CM

## 2018-04-06 DIAGNOSIS — I10 Essential (primary) hypertension: Secondary | ICD-10-CM

## 2018-04-06 DIAGNOSIS — E1165 Type 2 diabetes mellitus with hyperglycemia: Secondary | ICD-10-CM | POA: Diagnosis not present

## 2018-04-06 DIAGNOSIS — R197 Diarrhea, unspecified: Secondary | ICD-10-CM | POA: Diagnosis not present

## 2018-04-06 DIAGNOSIS — IMO0002 Reserved for concepts with insufficient information to code with codable children: Secondary | ICD-10-CM

## 2018-04-06 DIAGNOSIS — Z794 Long term (current) use of insulin: Secondary | ICD-10-CM

## 2018-04-06 DIAGNOSIS — E785 Hyperlipidemia, unspecified: Secondary | ICD-10-CM

## 2018-04-06 MED ORDER — PIOGLITAZONE HCL 15 MG PO TABS: 15.0000 mg | ORAL_TABLET | Freq: Every day | ORAL | 1 refills | Status: DC

## 2018-04-06 NOTE — Patient Instructions (Signed)
I am going to start you on Actos to help lower your blood sugars   Please let me know if you have any side effects   Follow up in two weeks or sooner if needed

## 2018-04-06 NOTE — Progress Notes (Signed)
Subjective:    Patient ID: Scott Vasquez, male    DOB: 10/25/1940, 77 y.o.   MRN: [id number]  HPI  77 year old male who  has a past medical history of Anemia, Diabetes mellitus, Diverticulosis of colon, Hypertension, Stroke ([place]), and Thyroid nodule. He presents to the office today for an acute issue of diarrhea. During his last he was not 100% sure if he was taking Metformin an Glipizide for diabetes management. He was due for refills. Metformin and Glipzide were refilled. Once he started Metformin he started having diarrhea. He had diarrhea for about 3 days, he stopped Metformin and diarrhea ceased. He remembers in the past having diarrhea for Metformin.   He did not qualify for assistance with Invokana or Trulicity and he cannot afford this    Review of Systems See HPI   Past Medical History:  Diagnosis Date  . Anemia    iron deficiency  . Diabetes mellitus    type II  . Diverticulosis of colon   . Hypertension   . Stroke ([place])    numbness in left hand  . Thyroid nodule     Social History   Socioeconomic History  . Marital status: Married    Spouse name: Not on file  . Number of children: Not on file  . Years of education: Not on file  . Highest education level: Not on file  Occupational History  . Occupation: retired    Fish farm manager: [employer]  . Financial resource strain: Not on file  . Food insecurity:    Worry: Not on file    Inability: Not on file  . Transportation needs:    Medical: Not on file    Non-medical: Not on file  Tobacco Use  . Smoking status: Former Smoker    Packs/day: 0.50    Years: 20.00    Pack years: 10.00    Types: Cigarettes    Start date: 1953    Last attempt to quit: 08/10/1958    Years since quitting: 59.6  . Smokeless tobacco: Never Used  Substance and Sexual Activity  . Alcohol use: No  . Drug use: No  . Sexual activity: Not on file  Lifestyle  . Physical activity:    Days per week: Not on file    Minutes per  session: Not on file  . Stress: Not on file  Relationships  . Social connections:    Talks on phone: Not on file    Gets together: Not on file    Attends religious service: Not on file    Active member of club or organization: Not on file    Attends meetings of clubs or organizations: Not on file    Relationship status: Not on file  . Intimate partner violence:    Fear of current or ex partner: Not on file    Emotionally abused: Not on file    Physically abused: Not on file    Forced sexual activity: Not on file  Other Topics Concern  . Not on file  Social History Narrative   Works at [employer] for 10 years/ works 5 days ; 40 hour week    - Married with two children who live locally   - No pets   - Attend church for fun.    Past Surgical History:  Procedure Laterality Date  . BACK SURGERY    . CATARACT EXTRACTION, BILATERAL    . SHOULDER SURGERY  08/2007   Supple  .  THYROID LOBECTOMY Left 04/29/2015  . THYROID LOBECTOMY Left 04/29/2015   Procedure: THYROID LOBECTOMY;  Surgeon: Armandina Gemma, MD;  Location: [facility] OR;  Service: General;  Laterality: Left;    Family History  Problem Relation Age of Onset  . Diabetes Unknown   . Diabetes Father        Died from diabetic coma in 10/30/1970  . Hypertension Father   . Stroke Mother        Died in 43  . Hypertension Mother     Allergies  Allergen Reactions  . Metformin And Related     Diarrhea      Current Outpatient Medications on File Prior to Visit  Medication Sig Dispense Refill  . albuterol (PROAIR HFA) 108 (90 Base) MCG/ACT inhaler Inhale 2 puffs into the lungs every 6 (six) hours as needed for wheezing or shortness of breath. 1 Inhaler 2  . atorvastatin (LIPITOR) 40 MG tablet TAKE 1 TABLET BY MOUTH ONCE DAILY 90 tablet 2  . Azelastine-Fluticasone 137-50 MCG/ACT SUSP Place 1 spray into the nose 2 (two) times daily. 23 g 3  . Blood Glucose Monitoring Suppl (ONE TOUCH ULTRA SYSTEM KIT) W/DEVICE KIT Use to check blood sugar  twice a day 1 each 0  . clopidogrel (PLAVIX) 75 MG tablet Take 1 tablet (75 mg total) by mouth daily. 90 tablet 3  . cyanocobalamin 100 MCG tablet Take 100 mcg by mouth daily.    Marland Kitchen donepezil (ARICEPT) 23 MG TABS tablet TAKE ONE TABLET BY MOUTH AT BEDTIME 90 tablet 3  . glipiZIDE (GLUCOTROL) 10 MG tablet Take 1 tablet (10 mg total) by mouth 2 (two) times daily before a meal. Take 2 tablets by mouth twice daily. 180 tablet 0  . glucose blood (ONE TOUCH ULTRA TEST) test strip USE TO CHECK BLOOD SUGAR TWICE DAILY 100 each 3  . ipratropium (ATROVENT) 0.03 % nasal spray Place 2 sprays into both nostrils every 12 (twelve) hours. 30 mL 12  . levocetirizine (XYZAL) 5 MG tablet TAKE ONE TABLET BY MOUTH EVERY EVENING. 30 tablet 5  . levothyroxine (SYNTHROID, LEVOTHROID) 50 MCG tablet TAKE ONE TABLET BY MOUTH ONCE DAILY 90 tablet 2  . lisinopril (PRINIVIL,ZESTRIL) 40 MG tablet Take 1 tablet (40 mg total) by mouth daily. 90 tablet 3  . meloxicam (MOBIC) 7.5 MG tablet TAKE ONE TABLET BY MOUTH ONCE DAILY. 30 tablet 0  . ONETOUCH DELICA LANCETS 69G MISC 1 each by Other route See admin instructions. Check blood sugar twice daily  0  . tamsulosin (FLOMAX) 0.4 MG CAPS capsule Take 1 capsule (0.4 mg total) by mouth daily. 90 capsule 3   No current facility-administered medications on file prior to visit.     BP (!) 90/40   Temp 98 F (36.7 C)       Objective:   Physical Exam  Constitutional: He is oriented to person, place, and time. He appears well-developed and well-nourished. No distress.  Cardiovascular: Normal rate, regular rhythm, normal heart sounds and intact distal pulses.  Pulmonary/Chest: Effort normal and breath sounds normal.  Abdominal: Soft. Bowel sounds are normal. He exhibits no distension and no mass. There is no tenderness. There is no rebound and no guarding. No hernia.  Neurological: He is alert and oriented to person, place, and time.  Skin: Skin is warm and dry. Capillary refill  takes less than 2 seconds. He is not diaphoretic.  Psychiatric: He has a normal mood and affect. His behavior is normal. Judgment and thought content  normal.  Nursing note and vitals reviewed.      Assessment & Plan:  1. Diabetes mellitus type 2 with complications, uncontrolled ([place]) - Will d/c Metformin due to diarrhea. Will start on Actos.  - Will have him follow up in 2 weeks. Will check BMP and liver function then  - Side effects reviewed with patient and return precautions given  - pioglitazone (ACTOS) 15 MG tablet; Take 1 tablet (15 mg total) by mouth daily.  Dispense: 30 tablet; Refill: 1  2. Diarrhea, unspecified type - resolved. Likely due to metformin   [name], NP

## 2018-04-06 NOTE — Progress Notes (Signed)
It is worth a try

## 2018-04-06 NOTE — Patient Outreach (Addendum)
[facility]) Care Management  04/06/2018  Shreyan Hinz Tomerlin [birth year] [id number]  Successful outreach to Mr. Magwood' wife, [name].  HIPAA identifiers verified.   Medication Assistance: Alice received a rejection letter from [employer] for [place].  They state that his OOP was only $541.64 which is not 4% of his income.   Anticipate that Ralph Leyden will deny his Trulicity application too, as they require an OOP of $1000.  The previous OOP figure received from [facility] was incorrect and must have been both his and the insurance companies spending on his prescriptions instead of the patient's OOP alone.    Mrs. Mirante states that the [place] she submitted would have some pre-retirement income on it.  Verified his current income and they still would need to spend ~$450 more to be eligible for both of these medications.  She verbalized understanding.  After discussion with [facility] CPhT, we decided to send a hardship letter for these medications to see if we can get approval.   Will also ask provider to consider switching to empagliflozin (Jardiance) made by [employer].  They require no out of pocket expenditure to be eligible for patient assistance.   Plan: Route note to C. Nafziger.  Follow case along with CPhT, Etter Sjogren.  Joetta Manners, PharmD Clinical Pharmacist [place] [phone]

## 2018-04-07 NOTE — Telephone Encounter (Signed)
Denied.  Not filled in 2 years.  Pt no longer taking.

## 2018-04-08 ENCOUNTER — Other Ambulatory Visit: Payer: Self-pay | Admitting: Pharmacy Technician

## 2018-04-08 ENCOUNTER — Other Ambulatory Visit: Payer: Self-pay

## 2018-04-08 NOTE — Patient Outreach (Signed)
[facility]) Care Management  04/08/2018  Yovan Leeman Radwan 1940/11/30 [id number]   Received request from [place] to send patient letter of hardship to continue application process for Trulicity. Prepared letter to be mailed.   Will follow up with patient in 5-7 business days to confirm letter has been received.  Maud Deed [place], [place] Management [phone]

## 2018-04-08 NOTE — Patient Outreach (Signed)
[facility]) Care Management  04/08/2018  Ashaun Gaughan Dimercurio 28-Sep-1940 [id number]  In-basket message received from Harless Litten, NP to continue with Trulicity hardship letter to [employer] for Mr. Galster.  Will not proceed with Invokana hardship letter as his regimen has changed.     Plan: Route letter to CPhT, Etter Sjogren to send hardship letter.  Joetta Manners, PharmD Clinical Pharmacist [place] [phone]

## 2018-04-20 ENCOUNTER — Ambulatory Visit (INDEPENDENT_AMBULATORY_CARE_PROVIDER_SITE_OTHER): Payer: Medicare Other | Admitting: Adult Health

## 2018-04-20 ENCOUNTER — Encounter: Payer: Self-pay | Admitting: Adult Health

## 2018-04-20 ENCOUNTER — Other Ambulatory Visit: Payer: Self-pay | Admitting: Adult Health

## 2018-04-20 VITALS — BP 100/50 | Temp 97.9°F | Wt 187.0 lb

## 2018-04-20 DIAGNOSIS — E1165 Type 2 diabetes mellitus with hyperglycemia: Secondary | ICD-10-CM

## 2018-04-20 DIAGNOSIS — R972 Elevated prostate specific antigen [PSA]: Secondary | ICD-10-CM

## 2018-04-20 DIAGNOSIS — E118 Type 2 diabetes mellitus with unspecified complications: Secondary | ICD-10-CM | POA: Diagnosis not present

## 2018-04-20 DIAGNOSIS — IMO0002 Reserved for concepts with insufficient information to code with codable children: Secondary | ICD-10-CM

## 2018-04-20 LAB — BASIC METABOLIC PANEL
BUN: 18 mg/dL (ref 6–23)
CALCIUM: 9.5 mg/dL (ref 8.4–10.5)
CO2: 30 meq/L (ref 19–32)
CREATININE: 1.4 mg/dL (ref 0.40–1.50)
Chloride: 103 mEq/L (ref 96–112)
GFR: 62.88 mL/min (ref >60.00)
Glucose, Bld: 133 mg/dL — ABNORMAL HIGH (ref 70–99)
Potassium: 4.5 mEq/L (ref 3.5–5.1)
SODIUM: 140 meq/L (ref 135–145)

## 2018-04-20 LAB — PSA: PSA: 4.57 ng/mL — ABNORMAL HIGH (ref 0.10–4.00)

## 2018-04-20 LAB — HEPATIC FUNCTION PANEL
ALBUMIN: 4 g/dL (ref 3.5–5.2)
ALT: 7 U/L (ref 0–53)
AST: 8 U/L (ref 0–37)
Alkaline Phosphatase: 118 U/L — ABNORMAL HIGH (ref 39–117)
BILIRUBIN DIRECT: 0.1 mg/dL (ref 0.0–0.3)
TOTAL PROTEIN: 6.9 g/dL (ref 6.0–8.3)
Total Bilirubin: 0.5 mg/dL (ref 0.2–1.2)

## 2018-04-20 MED ORDER — GLIMEPIRIDE 1 MG PO TABS: 1.0000 mg | ORAL_TABLET | Freq: Every day | ORAL | 0 refills | Status: DC

## 2018-04-20 NOTE — Progress Notes (Signed)
Subjective:    Patient ID: Scott Vasquez, male    DOB: 01-08-1941, 77 y.o.   MRN: [id number]  HPI 77 year old male who  has a past medical history of Anemia, Diabetes mellitus, Diverticulosis of colon, Hypertension, Stroke ([place]), and Thyroid nodule. Presents to the office today for 2-week follow-up in diabetes.  During his last visit he was started on Actos, due to metformin causing diarrhea.  Trialed him on Invokana and Trulicity but he could not afford these and unfortunately did not qualify for assistance through the drug companies.  Today in the office he reports that after the second or third dose he started to develop diarrhea. He stopped the medication about two days ago and the diarrhea stopped. He continues to take Glipzide. Reports that his fasting blood sugar this morning was 140. Denies having any readings above 200.   During this visit he will also have his PSA rechecked, and his CPE in May 2019 his PSA was slightly elevated at  4.19  Review of Systems See HPI   Past Medical History:  Diagnosis Date  . Anemia    iron deficiency  . Diabetes mellitus    type II  . Diverticulosis of colon   . Hypertension   . Stroke ([place])    numbness in left hand  . Thyroid nodule     Social History   Socioeconomic History  . Marital status: Married    Spouse name: Not on file  . Number of children: Not on file  . Years of education: Not on file  . Highest education level: Not on file  Occupational History  . Occupation: retired    Fish farm manager: [employer]  . Financial resource strain: Not on file  . Food insecurity:    Worry: Not on file    Inability: Not on file  . Transportation needs:    Medical: Not on file    Non-medical: Not on file  Tobacco Use  . Smoking status: Former Smoker    Packs/day: 0.50    Years: 20.00    Pack years: 10.00    Types: Cigarettes    Start date: 1953    Last attempt to quit: 08/10/1958    Years since quitting: 59.7  . Smokeless  tobacco: Never Used  Substance and Sexual Activity  . Alcohol use: No  . Drug use: No  . Sexual activity: Not on file  Lifestyle  . Physical activity:    Days per week: Not on file    Minutes per session: Not on file  . Stress: Not on file  Relationships  . Social connections:    Talks on phone: Not on file    Gets together: Not on file    Attends religious service: Not on file    Active member of club or organization: Not on file    Attends meetings of clubs or organizations: Not on file    Relationship status: Not on file  . Intimate partner violence:    Fear of current or ex partner: Not on file    Emotionally abused: Not on file    Physically abused: Not on file    Forced sexual activity: Not on file  Other Topics Concern  . Not on file  Social History Narrative   Works at [employer] for 10 years/ works 5 days ; 40 hour week    - Married with two children who live locally   - No pets   -  Attend church for fun.    Past Surgical History:  Procedure Laterality Date  . BACK SURGERY    . CATARACT EXTRACTION, BILATERAL    . SHOULDER SURGERY  08/2007   Supple  . THYROID LOBECTOMY Left 04/29/2015  . THYROID LOBECTOMY Left 04/29/2015   Procedure: THYROID LOBECTOMY;  Surgeon: Armandina Gemma, MD;  Location: [facility];  Service: General;  Laterality: Left;    Family History  Problem Relation Age of Onset  . Diabetes Unknown   . Diabetes Father        Died from diabetic coma in 1970/10/17  . Hypertension Father   . Stroke Mother        Died in 61  . Hypertension Mother     Allergies  Allergen Reactions  . Glipizide Diarrhea  . Metformin And Related     Diarrhea      Current Outpatient Medications on File Prior to Visit  Medication Sig Dispense Refill  . albuterol (PROAIR HFA) 108 (90 Base) MCG/ACT inhaler Inhale 2 puffs into the lungs every 6 (six) hours as needed for wheezing or shortness of breath. 1 Inhaler 2  . atorvastatin (LIPITOR) 40 MG tablet TAKE 1 TABLET BY MOUTH  ONCE DAILY 90 tablet 2  . Azelastine-Fluticasone 137-50 MCG/ACT SUSP Place 1 spray into the nose 2 (two) times daily. 23 g 3  . Blood Glucose Monitoring Suppl (ONE TOUCH ULTRA SYSTEM KIT) W/DEVICE KIT Use to check blood sugar twice a day 1 each 0  . clopidogrel (PLAVIX) 75 MG tablet Take 1 tablet (75 mg total) by mouth daily. 90 tablet 3  . cyanocobalamin 100 MCG tablet Take 100 mcg by mouth daily.    Marland Kitchen donepezil (ARICEPT) 23 MG TABS tablet TAKE ONE TABLET BY MOUTH AT BEDTIME 90 tablet 3  . glucose blood (ONE TOUCH ULTRA TEST) test strip USE TO CHECK BLOOD SUGAR TWICE DAILY 100 each 3  . ipratropium (ATROVENT) 0.03 % nasal spray Place 2 sprays into both nostrils every 12 (twelve) hours. 30 mL 12  . levocetirizine (XYZAL) 5 MG tablet TAKE ONE TABLET BY MOUTH EVERY EVENING. 30 tablet 5  . levothyroxine (SYNTHROID, LEVOTHROID) 50 MCG tablet TAKE ONE TABLET BY MOUTH ONCE DAILY 90 tablet 2  . lisinopril (PRINIVIL,ZESTRIL) 40 MG tablet Take 1 tablet (40 mg total) by mouth daily. 90 tablet 3  . meloxicam (MOBIC) 7.5 MG tablet TAKE ONE TABLET BY MOUTH ONCE DAILY. 30 tablet 0  . ONETOUCH DELICA LANCETS 42A MISC 1 each by Other route See admin instructions. Check blood sugar twice daily  0  . tamsulosin (FLOMAX) 0.4 MG CAPS capsule Take 1 capsule (0.4 mg total) by mouth daily. 90 capsule 3   No current facility-administered medications on file prior to visit.     BP (!) 100/50   Temp 97.9 F (36.6 C)   Wt 187 lb (84.8 kg)   BMI 27.62 kg/m       Objective:   Physical Exam  Constitutional: He is oriented to person, place, and time. He appears well-developed and well-nourished. No distress.  Cardiovascular: Normal rate, regular rhythm, normal heart sounds and intact distal pulses.  Pulmonary/Chest: Effort normal and breath sounds normal.  Musculoskeletal: Normal range of motion.  Neurological: He is alert and oriented to person, place, and time.  Skin: Skin is warm. He is not diaphoretic.    Psychiatric: He has a normal mood and affect. His behavior is normal. Judgment and thought content normal.  Nursing note and vitals reviewed.  Assessment & Plan:  1. Diabetes mellitus type 2 with complications, uncontrolled ([place]) - Will look for other alternatives. Possibly Amaryl  - Basic Metabolic Panel - Hepatic function panel  2. PSA elevation  - PSA  [name], NP

## 2018-04-22 ENCOUNTER — Other Ambulatory Visit: Payer: Self-pay | Admitting: Family Medicine

## 2018-04-22 DIAGNOSIS — R972 Elevated prostate specific antigen [PSA]: Secondary | ICD-10-CM

## 2018-05-06 ENCOUNTER — Other Ambulatory Visit: Payer: Self-pay | Admitting: Adult Health

## 2018-05-06 DIAGNOSIS — IMO0002 Reserved for concepts with insufficient information to code with codable children: Secondary | ICD-10-CM

## 2018-05-06 DIAGNOSIS — E118 Type 2 diabetes mellitus with unspecified complications: Principal | ICD-10-CM

## 2018-05-06 DIAGNOSIS — E1165 Type 2 diabetes mellitus with hyperglycemia: Secondary | ICD-10-CM

## 2018-05-12 ENCOUNTER — Other Ambulatory Visit: Payer: Self-pay | Admitting: Adult Health

## 2018-05-12 DIAGNOSIS — R350 Frequency of micturition: Secondary | ICD-10-CM

## 2018-05-17 ENCOUNTER — Encounter: Payer: Self-pay | Admitting: Pharmacy Technician

## 2018-05-17 ENCOUNTER — Other Ambulatory Visit: Payer: Self-pay | Admitting: Pharmacy Technician

## 2018-05-17 NOTE — Patient Outreach (Signed)
[place] [facility]) Care Management  05/17/2018  Jaamal Farooqui Cona Jan 27, 1941 [id number]   Successful call to Mr. Sciulli, HIPAA identifiers verified. Patient states that he has not received the Letter of Hardship that was mailed out to him. I informed him that I would mail him another for him to sign and return so that we can continue with trying to get his Trulicity approved.  Prepared letter to be mailed, will follow up with patient in 5-7 business days to confirm he has received it.  Maud Deed [place], [place] Management [phone]

## 2018-05-25 ENCOUNTER — Ambulatory Visit: Payer: Self-pay | Admitting: Pharmacy Technician

## 2018-05-27 ENCOUNTER — Ambulatory Visit: Payer: Self-pay | Admitting: Pharmacy Technician

## 2018-06-01 ENCOUNTER — Other Ambulatory Visit: Payer: Self-pay | Admitting: Pharmacy Technician

## 2018-06-01 NOTE — Patient Outreach (Signed)
[facility]) Care Management  06/01/2018  Keyonte Cookston Saur 14-Jan-1941 [id number]   Received signed Letter of Hardship for patients Trulicity. Faxed into [employer] to have document reviewed for approval.  Will follow up with company in 3-5 business days to check status of application.  Maud Deed [place], [place] Management [phone]

## 2018-06-09 DIAGNOSIS — R3912 Poor urinary stream: Secondary | ICD-10-CM | POA: Diagnosis not present

## 2018-06-10 ENCOUNTER — Other Ambulatory Visit: Payer: Self-pay

## 2018-06-10 ENCOUNTER — Other Ambulatory Visit: Payer: Self-pay | Admitting: Pharmacy Technician

## 2018-06-10 NOTE — Patient Outreach (Signed)
[place] [facility]) Care Management  06/10/2018  Khayri Kargbo Nasworthy 10/24/1940 [id number]   Follow up call to [facility] patient assistance to check status of patients application for Trulicity. Per Margretta Ditty patients has been approved as of 10/29 and has been delivered to providers office.  Received SKYPE message from [facility] stating that medication had arrived at providers office but due to transportation issues patient is not able to pick it up.  Will route note to [place] for possible coordination to help patient.  Maud Deed Beola Cord Certified Pharmacy Technician [facility] Care Management [phone]

## 2018-06-10 NOTE — Patient Outreach (Addendum)
[place] [facility]) Care Management  06/10/2018  Revis Whalin Petrides 1941/04/14 [id number]  Successful outreach to Mr. Ledgerwood and HIPAA identifiers verified.   Mr. Davisson' Trulicity has been delivered to his PCP's office and he does not have transportation to pick it up.  I will go by the PCP's office on Monday and pick it up and [facility] RN, Jacqlyn Larsen will deliver it to Mr. Krzyzanowski next week.  Mr. Aragones is aware that the nurse will call him to set up a time to deliver his medication.   Plan: Route note to [place], Jacqlyn Larsen.  Joetta Manners, PharmD Clinical Pharmacist [place] [phone]

## 2018-06-16 ENCOUNTER — Other Ambulatory Visit: Payer: Self-pay | Admitting: *Deleted

## 2018-06-16 NOTE — Patient Outreach (Signed)
RN CM drove to patient's home to deliver insulin, male came to door and states Mr. Tillett is not home and will be back shortly, RN CM advised that insulin is to be placed into refrigerator,  RN CM wrote out [facility] pharmacist Anderson Malta Mendenhall's contact phone number if pt should have any questions about the insulin.  RN CM communicated to [facility] pharmacist that insulin was delivered.  Jacqlyn Larsen [facility], [place] Coordinator [phone]

## 2018-07-14 ENCOUNTER — Ambulatory Visit (INDEPENDENT_AMBULATORY_CARE_PROVIDER_SITE_OTHER): Payer: Medicare Other | Admitting: Adult Health

## 2018-07-14 ENCOUNTER — Encounter: Payer: Self-pay | Admitting: Adult Health

## 2018-07-14 VITALS — BP 150/80 | Temp 98.1°F | Wt 197.0 lb

## 2018-07-14 DIAGNOSIS — E118 Type 2 diabetes mellitus with unspecified complications: Secondary | ICD-10-CM

## 2018-07-14 DIAGNOSIS — G3184 Mild cognitive impairment, so stated: Secondary | ICD-10-CM | POA: Diagnosis not present

## 2018-07-14 DIAGNOSIS — Z23 Encounter for immunization: Secondary | ICD-10-CM | POA: Diagnosis not present

## 2018-07-14 DIAGNOSIS — IMO0002 Reserved for concepts with insufficient information to code with codable children: Secondary | ICD-10-CM

## 2018-07-14 DIAGNOSIS — R972 Elevated prostate specific antigen [PSA]: Secondary | ICD-10-CM

## 2018-07-14 DIAGNOSIS — E1165 Type 2 diabetes mellitus with hyperglycemia: Secondary | ICD-10-CM

## 2018-07-14 LAB — BASIC METABOLIC PANEL
BUN: 15 mg/dL (ref 6–23)
CHLORIDE: 101 meq/L (ref 96–112)
CO2: 30 mEq/L (ref 19–32)
CREATININE: 1.5 mg/dL (ref 0.40–1.50)
Calcium: 9.9 mg/dL (ref 8.4–10.5)
GFR: 58.03 mL/min — AB (ref >60.00)
GLUCOSE: 106 mg/dL — AB (ref 70–99)
POTASSIUM: 4.5 meq/L (ref 3.5–5.1)
Sodium: 139 mEq/L (ref 135–145)

## 2018-07-14 LAB — POCT GLYCOSYLATED HEMOGLOBIN (HGB A1C): HbA1c, POC (controlled diabetic range): 6.7 % (ref 0.0–7.0)

## 2018-07-14 LAB — PSA: PSA: 4.14 ng/mL — ABNORMAL HIGH (ref 0.10–4.00)

## 2018-07-14 NOTE — Progress Notes (Signed)
Subjective:    Patient ID: Scott Vasquez, male    DOB: [birth year], 77 y.o.   MRN: [id number]  HPI 77 year old male who  has a past medical history of Anemia, Diabetes mellitus, Diverticulosis of colon, Hypertension, Stroke ([place]), and Thyroid nodule.  He presents to the office today for follow up regarding DM and elevated PSA   DM - Currently prescribed Actos 15 mg, Amaryl 1 mg, and Trulicity 1.5 mg. He reports at home his blood sugars have been very well controlled since going up on Trulicity dose. He reports blood sugar readings at home 100-120. He denies symptoms of hypoglycemia. He is not having diarrhea. Overall, he feels really good.  Lab Results  Component Value Date   HGBA1C 9.2 03/31/2018   PSA - was slightly elevated during his CPE in September. He denies any worsening symptoms of BPH or painful bowel movements. We will recheck today   His biggest complaint today is feeling as though cognitive function is declining further. He is currently prescribed Arciept 23 mg QHS. Per patient " I don't feel like this is working any longer". He denies getting lost while driving. He is no longer losing his keys or wallet as he places them in the same place daily. The reason he feels as though his cognition is declining is that " I will read the bible or a magazine article and as soon I am finish, I cannot remember what I was reading." He continues " I also have trouble putting names to faces. It takes me too long to remember a name of someone that my wife mentions."   His most recent MR brain was in 2016, which showed  IMPRESSION: 1. No acute punctate nonhemorrhagic infarct within the left paramedian superior midbrain and inferomedial thalamus. 2. Multiple other remote lacunar infarcts are evident within the thalami bilaterally. 3. Remote lacunar infarct of the genu of the left right internal capsule. 4. 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 5.  Multiple foci of remote hemorrhage within the thalami a into lesions in the midbrain. This can be seen in the setting of hypertension or vasculitis. No acute hemorrhage is evident.   Review of Systems See HPI   Past Medical History:  Diagnosis Date  . Anemia    iron deficiency  . Diabetes mellitus    type II  . Diverticulosis of colon   . Hypertension   . Stroke ([place])    numbness in left hand  . Thyroid nodule     Social History   Socioeconomic History  . Marital status: Married    Spouse name: Not on file  . Number of children: Not on file  . Years of education: Not on file  . Highest education level: Not on file  Occupational History  . Occupation: retired    Fish farm manager: [employer]  . Financial resource strain: Not on file  . Food insecurity:    Worry: Not on file    Inability: Not on file  . Transportation needs:    Medical: Not on file    Non-medical: Not on file  Tobacco Use  . Smoking status: Former Smoker    Packs/day: 0.50    Years: 20.00    Pack years: 10.00    Types: Cigarettes    Start date: 1953    Last attempt to quit: 08/10/1958    Years since quitting: 59.9  . Smokeless tobacco: Never Used  Substance and Sexual  Activity  . Alcohol use: No  . Drug use: No  . Sexual activity: Not on file  Lifestyle  . Physical activity:    Days per week: Not on file    Minutes per session: Not on file  . Stress: Not on file  Relationships  . Social connections:    Talks on phone: Not on file    Gets together: Not on file    Attends religious service: Not on file    Active member of club or organization: Not on file    Attends meetings of clubs or organizations: Not on file    Relationship status: Not on file  . Intimate partner violence:    Fear of current or ex partner: Not on file    Emotionally abused: Not on file    Physically abused: Not on file    Forced sexual activity: Not on file  Other Topics Concern  . Not on file  Social History  Narrative   Works at [employer] for 10 years/ works 5 days ; 40 hour week    - Married with two children who live locally   - No pets   - Attend church for fun.    Past Surgical History:  Procedure Laterality Date  . BACK SURGERY    . CATARACT EXTRACTION, BILATERAL    . SHOULDER SURGERY  08/2007   Supple  . THYROID LOBECTOMY Left 04/29/2015  . THYROID LOBECTOMY Left 04/29/2015   Procedure: THYROID LOBECTOMY;  Surgeon: Armandina Gemma, MD;  Location: [facility] OR;  Service: General;  Laterality: Left;    Family History  Problem Relation Age of Onset  . Diabetes Unknown   . Diabetes Father        Died from diabetic coma in 10/07/1970  . Hypertension Father   . Stroke Mother        Died in 73  . Hypertension Mother     Allergies  Allergen Reactions  . Actos [Pioglitazone]     Diarrhea   . Metformin And Related     Diarrhea      Current Outpatient Medications on File Prior to Visit  Medication Sig Dispense Refill  . albuterol (PROAIR HFA) 108 (90 Base) MCG/ACT inhaler Inhale 2 puffs into the lungs every 6 (six) hours as needed for wheezing or shortness of breath. 1 Inhaler 2  . atorvastatin (LIPITOR) 40 MG tablet TAKE 1 TABLET BY MOUTH ONCE DAILY 90 tablet 2  . Azelastine-Fluticasone 137-50 MCG/ACT SUSP Place 1 spray into the nose 2 (two) times daily. 23 g 3  . Blood Glucose Monitoring Suppl (ONE TOUCH ULTRA SYSTEM KIT) W/DEVICE KIT Use to check blood sugar twice a day 1 each 0  . clopidogrel (PLAVIX) 75 MG tablet TAKE ONE TABLET BY MOUTH EVERY DAY 90 tablet 0  . cyanocobalamin 100 MCG tablet Take 100 mcg by mouth daily.    Marland Kitchen donepezil (ARICEPT) 23 MG TABS tablet TAKE ONE TABLET BY MOUTH AT BEDTIME 90 tablet 3  . Dulaglutide (TRULICITY) 1.5 NU/2.7OZ SOPN Inject 1.5 mg into the skin once a week.    Marland Kitchen glimepiride (AMARYL) 1 MG tablet Take 1 tablet (1 mg total) by mouth daily with breakfast. 90 tablet 0  . glucose blood (ONE TOUCH ULTRA TEST) test strip USE TO CHECK BLOOD SUGAR TWICE DAILY 100  each 3  . ipratropium (ATROVENT) 0.03 % nasal spray Place 2 sprays into both nostrils every 12 (twelve) hours. 30 mL 12  . levocetirizine (XYZAL) 5 MG tablet  TAKE ONE TABLET BY MOUTH EVERY EVENING. 30 tablet 5  . levothyroxine (SYNTHROID, LEVOTHROID) 50 MCG tablet TAKE ONE TABLET BY MOUTH ONCE DAILY 90 tablet 2  . lisinopril (PRINIVIL,ZESTRIL) 40 MG tablet Take 1 tablet (40 mg total) by mouth daily. 90 tablet 3  . meloxicam (MOBIC) 7.5 MG tablet TAKE ONE TABLET BY MOUTH ONCE DAILY. 30 tablet 0  . ONETOUCH DELICA LANCETS 84F MISC 1 each by Other route See admin instructions. Check blood sugar twice daily  0  . pioglitazone (ACTOS) 15 MG tablet TAKE ONE TABLET BY MOUTH DAILY. 90 tablet 0  . tamsulosin (FLOMAX) 0.4 MG CAPS capsule TAKE ONE CAPSULE BY MOUTH EVERY DAY 90 capsule 0   No current facility-administered medications on file prior to visit.     BP (!) 150/80   Temp 98.1 F (36.7 C)   Wt 197 lb (89.4 kg)   BMI 29.09 kg/m       Objective:   Physical Exam  Constitutional: He is oriented to person, place, and time. He appears well-developed and well-nourished. No distress.  Cardiovascular: Normal rate, regular rhythm, normal heart sounds and intact distal pulses.  Pulmonary/Chest: Effort normal and breath sounds normal.  Musculoskeletal: Normal range of motion. He exhibits no edema, tenderness or deformity.  Neurological: He is alert and oriented to person, place, and time. He displays normal reflexes. No cranial nerve deficit or sensory deficit. He exhibits normal muscle tone. Coordination normal.  Skin: Skin is warm and dry. He is not diaphoretic.  Psychiatric: He has a normal mood and affect. His behavior is normal. Judgment and thought content normal.  Nursing note and vitals reviewed.     Assessment & Plan:  1. Diabetes mellitus type 2 with complications, uncontrolled ([facility])  - POCT A1C- 6.7. This is the best it has been in 3 + years.  - Continue with current therapy  -  Follow up in 3 months, if A1c continues to drop will likely d/c actos  - Basic Metabolic Panel  2. Elevated PSA  - PSA - Consider referral to urology  3. Mild cognitive impairment - Will refer to neurology at patients request. We talked about getting another MRI but he would like to hold off until seen by Neurology  - Ambulatory referral to Neurology  4. Need for prophylactic vaccination and inoculation against influenza  - Flu vaccine HIGH DOSE PF (Fluzone High dose)  [name], NP

## 2018-07-19 ENCOUNTER — Encounter: Payer: Self-pay | Admitting: Neurology

## 2018-07-19 ENCOUNTER — Other Ambulatory Visit: Payer: Self-pay | Admitting: Adult Health

## 2018-07-19 NOTE — Telephone Encounter (Signed)
Sent to the pharmacy by e-scribe. 

## 2018-08-01 ENCOUNTER — Other Ambulatory Visit: Payer: Self-pay | Admitting: Pharmacy Technician

## 2018-08-01 NOTE — Patient Outreach (Signed)
[place] [facility]) Care Management  08/01/2018  Scott Vasquez 07-02-1941 [id number]    Successful call placed to patient regarding patient assistance application(s) for Trulicity re-enrollment , HIPAA identifiers verified. Patient is agreeable to reapplying to [employer] for his Trulicity with a letter of hardship. Will prepare application to be mailed to patient.  Will follow up with patient in 5-10 business days due to holidays to confirm application has been received.   Maud Deed Chana Bode [place] Certified Pharmacy Technician [place] Management Direct Dial:[phone]

## 2018-08-30 ENCOUNTER — Other Ambulatory Visit: Payer: Self-pay | Admitting: Pharmacy Technician

## 2018-08-30 NOTE — Patient Outreach (Signed)
[place] [place] [facility]) Care Management  08/30/2018  Scott Vasquez 1941/07/03 [id number]    Unsuccessful call #1 placed to patient regarding patient assistance application(s) for Trulicity , detailed voicemail left.  Will make 2nd call attempt in 2-3 business days if call has not been returned.  Maud Deed Chana Bode [place] Certified Pharmacy Technician [place] Management Direct Dial:[phone]

## 2018-09-05 ENCOUNTER — Other Ambulatory Visit: Payer: Self-pay | Admitting: Pharmacy Technician

## 2018-09-05 NOTE — Patient Outreach (Addendum)
[place] [facility]) Care Management  09/05/2018  Numair Masden Mcguffee [birth year] [id number]    Unsuccessful call #2 placed to patient regarding patient assistance application(s) for Trulicity , detailed voicemail left.   Will make 3rd call attempt in 2-3 business days if call has not been returned.  Maud Deed Chana Bode [place] Certified Pharmacy Technician [place] Management Direct [phone]   ADDENDUM 3:15pm  Return call from patient, HIPAA identifiers verified. Patient states he has not received the [employer] application that was mailed out to him. Prepapred another application to be mailed out.  Will follow up with patient in 5-7 business days to confirm app has been received.  Maud Deed Chana Bode [place] Certified Pharmacy Technician [place] Management Direct [phone]

## 2018-09-08 ENCOUNTER — Ambulatory Visit: Payer: Self-pay | Admitting: Pharmacy Technician

## 2018-09-08 ENCOUNTER — Other Ambulatory Visit: Payer: Self-pay | Admitting: Adult Health

## 2018-09-08 DIAGNOSIS — IMO0002 Reserved for concepts with insufficient information to code with codable children: Secondary | ICD-10-CM

## 2018-09-08 DIAGNOSIS — E118 Type 2 diabetes mellitus with unspecified complications: Principal | ICD-10-CM

## 2018-09-08 DIAGNOSIS — E1165 Type 2 diabetes mellitus with hyperglycemia: Secondary | ICD-10-CM

## 2018-09-09 NOTE — Telephone Encounter (Signed)
Sent to the pharmacy by e-scribe. 

## 2018-09-13 ENCOUNTER — Other Ambulatory Visit: Payer: Self-pay | Admitting: Adult Health

## 2018-09-14 NOTE — Telephone Encounter (Signed)
Tommi Rumps, this has not been filled in over 1 year.  Please advise.

## 2018-10-12 ENCOUNTER — Ambulatory Visit (INDEPENDENT_AMBULATORY_CARE_PROVIDER_SITE_OTHER): Payer: Medicare HMO | Admitting: Neurology

## 2018-10-12 ENCOUNTER — Other Ambulatory Visit: Payer: Self-pay

## 2018-10-12 ENCOUNTER — Encounter: Payer: Self-pay | Admitting: Neurology

## 2018-10-12 VITALS — BP 142/66 | HR 64 | Ht 70.5 in | Wt 200.0 lb

## 2018-10-12 DIAGNOSIS — F03B18 Unspecified dementia, moderate, with other behavioral disturbance: Secondary | ICD-10-CM

## 2018-10-12 DIAGNOSIS — R413 Other amnesia: Secondary | ICD-10-CM

## 2018-10-12 DIAGNOSIS — F0391 Unspecified dementia with behavioral disturbance: Secondary | ICD-10-CM | POA: Diagnosis not present

## 2018-10-12 MED ORDER — CITALOPRAM HYDROBROMIDE 10 MG PO TABS: 10.0000 mg | ORAL_TABLET | Freq: Every day | ORAL | 11 refills | Status: DC

## 2018-10-12 NOTE — Progress Notes (Signed)
 NEUROLOGY CONSULTATION NOTE  Scott Vasquez MRN: [id number] DOB: 02/07/1941  Referring provider: Cory Nafziger, NP Primary care provider: Cory Nafziger, NP  Reason for consult:  Memory loss  Thank you for your kind referral of Scott Vasquez for consultation of the above symptoms. Although his history is well known to you, please allow me to reiterate it for the purpose of our medical record. The patient was accompanied to the clinic by his wife who also provides collateral information. Records and images were personally reviewed where available.  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 He states his memory is bad, he started noticing changes a couple of years ago. He deneis getting lost driving. He misses bills because "there is no money to pay them." He manages his own medications and denies missing doses, his wife agrees. He was reporting difficulty remembering what he read in the bible or magazine after he finishes reading. He also has difficulty remembering names. His wife states that yesterday the bank called her because he was there and could not remember his social security number. She reports he got lost last month when he made a wrong turn. She reports other bills are paid but sometimes late, he has not paid taxes and car insurance from last year, he told her he will pay when ready. His wife asked to speak privately about her concerns, she is asking for a pill to "calm him down." She reports he is a "time bomb in the morning, I am his worst enemy." He would sit watching TV all day. Symptoms started last summer, he started sleeping upstairs because he was getting irritated with people. Their daughter lives with them. She denies any paranoia or hallucinations. Sleep is good. He is independent with dressing and  bathing.  He denies any headaches, dizziness, diplopia, dysarthria/dysphagia, neck/back pain, focal weakness, bowel/bladder dysfunction, anosmia, or tremors. He has chronic left hand tingling since his stroke. He has had nasal discharge since throat surgery in 2017. He has been on Donepezil since 2017, no side effects. No family history of dementia. No history of significant head injuries or alcohol use.  I personally reviewed MRI brain in 2016 showing an acute punctate nonhemorrhagic infarct within the left paramedian superior midbrain and inferomedial thalamus, multiple remote bilateral lacunar infarcts, advanced chronic microvascular disease, multiple foci of remote hemorrhage within the thalami.   Laboratory Data: Lab Results  Component Value Date   TSH 1.93 12/24/2017   Lab Results  Component Value Date   VITAMINB12 920 (H) 12/24/2017    PAST MEDICAL HISTORY: Past Medical History:  Diagnosis Date  . Anemia    iron deficiency  . Diabetes mellitus    type II  . Diverticulosis of colon   . Hypertension   . Stroke (HCC)    numbness in left hand  . Thyroid nodule     PAST SURGICAL HISTORY: Past Surgical History:  Procedure Laterality Date  . BACK SURGERY    . CATARACT EXTRACTION, BILATERAL    . SHOULDER SURGERY  08/2007   Supple  . THYROID LOBECTOMY Left 04/29/2015  . THYROID LOBECTOMY Left 04/29/2015   Procedure: THYROID LOBECTOMY;  Surgeon: Todd Gerkin, MD;  Location: [facility] OR;  Service: General;  Laterality: Left;    MEDICATIONS: Current Outpatient Medications on File Prior to Visit  Medication Sig Dispense   Refill  . atorvastatin (LIPITOR) 40 MG tablet TAKE 1 TABLET BY MOUTH ONCE DAILY 90 tablet 2  . Azelastine-Fluticasone 137-50 MCG/ACT SUSP Place 1 spray into the nose 2 (two) times daily. 23 g 3  . Blood Glucose Monitoring Suppl (ONE TOUCH ULTRA SYSTEM KIT) W/DEVICE KIT Use to check blood sugar twice a day 1 each 0  . clopidogrel (PLAVIX) 75 MG tablet TAKE ONE TABLET  BY MOUTH EVERY DAY 90 tablet 0  . cyanocobalamin 100 MCG tablet Take 100 mcg by mouth daily.    . donepezil (ARICEPT) 23 MG TABS tablet TAKE ONE TABLET BY MOUTH AT BEDTIME 90 tablet 3  . Dulaglutide (TRULICITY) 1.5 MG/0.5ML SOPN Inject 1.5 mg into the skin once a week.    . glimepiride (AMARYL) 1 MG tablet TAKE ONE TABLET BY MOUTH EVERY DAY WITH BREAKFAST 90 tablet 0  . glucose blood (ONE TOUCH ULTRA TEST) test strip USE TO CHECK BLOOD SUGAR TWICE DAILY 100 each 3  . ipratropium (ATROVENT) 0.03 % nasal spray Place 2 sprays into both nostrils every 12 (twelve) hours. 30 mL 12  . levocetirizine (XYZAL) 5 MG tablet TAKE ONE TABLET BY MOUTH EVERY EVENING. 30 tablet 5  . levothyroxine (SYNTHROID, LEVOTHROID) 50 MCG tablet TAKE ONE TABLET BY MOUTH ONCE DAILY 90 tablet 2  . lisinopril (PRINIVIL,ZESTRIL) 40 MG tablet Take 1 tablet (40 mg total) by mouth daily. 90 tablet 3  . meloxicam (MOBIC) 7.5 MG tablet TAKE ONE TABLET BY MOUTH ONCE DAILY. 30 tablet 0  . ONETOUCH DELICA LANCETS 33G MISC 1 each by Other route See admin instructions. Check blood sugar twice daily  0  . pioglitazone (ACTOS) 15 MG tablet TAKE ONE TABLET BY MOUTH DAILY. 90 tablet 0  . PROAIR HFA 108 (90 Base) MCG/ACT inhaler INHALE TWO PUFFS INTO THE LUNGS EVERY 6 HOURS AS NEEDED FOR FOR WHEEZING OR SHORTNESS OF BREATH. 8.5 g 0  . tamsulosin (FLOMAX) 0.4 MG CAPS capsule TAKE ONE CAPSULE BY MOUTH EVERY DAY 90 capsule 0   No current facility-administered medications on file prior to visit.     ALLERGIES: Allergies  Allergen Reactions  . Actos [Pioglitazone]     Diarrhea   . Metformin And Related     Diarrhea      FAMILY HISTORY: Family History  Problem Relation Age of Onset  . Diabetes Unknown   . Diabetes Father        Died from diabetic coma in 1972  . Hypertension Father   . Stroke Mother        Died in 1972  . Hypertension Mother     SOCIAL HISTORY: Social History   Socioeconomic History  . Marital status:  Married    Spouse name: Not on file  . Number of children: Not on file  . Years of education: Not on file  . Highest education level: Not on file  Occupational History  . Occupation: retired    Employer: [employer]  Social Needs  . Financial resource strain: Not on file  . Food insecurity:    Worry: Not on file    Inability: Not on file  . Transportation needs:    Medical: Not on file    Non-medical: Not on file  Tobacco Use  . Smoking status: Former Smoker    Packs/day: 0.50    Years: 20.00    Pack years: 10.00    Types: Cigarettes    Start date: 1953    Last attempt to quit: 08/10/1958      Years since quitting: 60.2  . Smokeless tobacco: Never Used  Substance and Sexual Activity  . Alcohol use: No  . Drug use: No  . Sexual activity: Not on file  Lifestyle  . Physical activity:    Days per week: Not on file    Minutes per session: Not on file  . Stress: Not on file  Relationships  . Social connections:    Talks on phone: Not on file    Gets together: Not on file    Attends religious service: Not on file    Active member of club or organization: Not on file    Attends meetings of clubs or organizations: Not on file    Relationship status: Not on file  . Intimate partner violence:    Fear of current or ex partner: Not on file    Emotionally abused: Not on file    Physically abused: Not on file    Forced sexual activity: Not on file  Other Topics Concern  . Not on file  Social History Narrative   Works at [employer] for 10 years/ works 5 days ; 40 hour week    - Married with two children who live locally   - No pets   - Attend church for fun.    REVIEW OF SYSTEMS: Constitutional: No fevers, chills, or sweats, no generalized fatigue, change in appetite Eyes: No visual changes, double vision, eye pain Ear, nose and throat: No hearing loss, ear pain, nasal congestion, sore throat Cardiovascular: No chest pain, palpitations Respiratory:  No shortness of breath at rest  or with exertion, wheezes GastrointestinaI: No nausea, vomiting, diarrhea, abdominal pain, fecal incontinence Genitourinary:  No dysuria, urinary retention or frequency Musculoskeletal:  No neck pain, back pain Integumentary: No rash, pruritus, skin lesions Neurological: as above Psychiatric: No depression, insomnia, anxiety Endocrine: No palpitations, fatigue, diaphoresis, mood swings, change in appetite, change in weight, increased thirst Hematologic/Lymphatic:  No anemia, purpura, petechiae. Allergic/Immunologic: no itchy/runny eyes, nasal congestion, recent allergic reactions, rashes  PHYSICAL EXAM: Vitals:   10/12/18 1438  BP: (!) 142/66  Pulse: 64  SpO2: 98%   General: No acute distress Head:  Normocephalic/atraumatic Eyes: Fundoscopic exam shows bilateral sharp discs, no vessel changes, exudates, or hemorrhages Neck: supple, no paraspinal tenderness, full range of motion Back: No paraspinal tenderness Heart: regular rate and rhythm Lungs: Clear to auscultation bilaterally. Vascular: No carotid bruits. Skin/Extremities: No rash, no edema Neurological Exam: Mental status: alert and oriented to person, place, and time, no dysarthria or aphasia, Fund of knowledge is appropriate.  Recent and remote memory are impaired.  Attention and concentration are reduced, decreased fluency. Difficulty with naming and repetition. Montreal Cognitive Assessment  10/12/2018  Visuospatial/ Executive (0/5) 1  Naming (0/3) 1  Attention: Read list of digits (0/2) 1  Attention: Read list of letters (0/1) 0  Attention: Serial 7 subtraction starting at 100 (0/3) 0  Language: Repeat phrase (0/2) 0  Language : Fluency (0/1) 0  Abstraction (0/2) 0  Delayed Recall (0/5) 1  Orientation (0/6) 6  Total 10  Adjusted Score (based on education) 11   Cranial nerves: CN I: not tested CN II: pupils equal, Vasquez and reactive to light, visual fields intact, fundi unremarkable. CN III, IV, VI:  full range of  motion, no nystagmus, no ptosis CN V: facial sensation intact CN VII: upper and lower face symmetric CN VIII: hearing intact to finger rub CN IX, X: gag intact, uvula midline CN XI: sternocleidomastoid and trapezius  muscles intact CN XII: tongue midline Bulk & Tone: normal, no cogwheeling, no fasciculations. Motor: 5/5 throughout with no pronator drift. Sensation: intact to light touch, cold, pin, vibration and joint position sense.  No extinction to double simultaneous stimulation.  Romberg test negative Deep Tendon Reflexes: +2 throughout, no ankle clonus Plantar responses: downgoing bilaterally Cerebellar: no incoordination on finger to nose, heel to shin. 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 Neurological exam non-focal, MOCA score today 11/30. 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 He is already on Donepezil 23 mg daily. He is now having more anger issues affecting his relationship with his wife. He has had small strokes on prior imaging likely due to small vessel disease, MRI brain without contrast will be ordered to assess for underlying structural abnormality. Continue daily Plavix, control of vascular risk factors. We discussed mood changes that occur with dementia, he is agreeable to starting citalopram 10mg daily, side effects discussed. Continue to monitor driving and medication compliance. Family instructed to start monitoring finances. We discussed the importance of control of vascular risk factors, physical exercise, and brain stimulation exercises for brain health. Follow-up in 6 months, they know to call for any changes.   Thank you for allowing me  to participate in the care of this patient. Please do not hesitate to call for any questions or concerns.   Karen Aquino, M.D.  CC: Cory Nafziger, NP   

## 2018-10-12 NOTE — Patient Instructions (Addendum)
1. Start citalopram 10mg  daily 2. Schedule MRI brain without contrast  We have sent a referral to [facility] for your MRI and they will call you directly to schedule your appt. They are located at [place]. If you need to contact them directly please call [phone].  3. Continue all your other medications 4. Follow-up in 6 months, call for any changes  FALL PRECAUTIONS: Be cautious when walking. Scan the area for obstacles that may increase the risk of trips and falls. When getting up in the mornings, sit up at the edge of the bed for a few minutes before getting out of bed. Consider elevating the bed at the head end to avoid drop of blood pressure when getting up. Walk always in a well-lit room (use night lights in the walls). Avoid area rugs or power cords from appliances in the middle of the walkways. Use a walker or a cane if necessary and consider physical therapy for balance exercise. Get your eyesight checked regularly.  FINANCIAL OVERSIGHT: Supervision, especially oversight when making financial decisions or transactions is also recommended.  HOME SAFETY: Consider the safety of the kitchen when operating appliances like stoves, microwave oven, and blender. Consider having supervision and share cooking responsibilities until no longer able to participate in those. Accidents with firearms and other hazards in the house should be identified and addressed as well.  DRIVING: Regarding driving, in patients with progressive memory problems, driving will be impaired. We advise to have someone else do the driving if trouble finding directions or if minor accidents are reported. Independent driving assessment is available to determine safety of driving.  ABILITY TO BE LEFT ALONE: If patient is unable to contact 911 operator, consider using LifeLine, or when the need is there, arrange for someone to stay with patients. Smoking is a fire hazard, consider supervision or cessation.  Risk of wandering should be assessed by caregiver and if detected at any point, supervision and safe proof recommendations should be instituted.  MEDICATION SUPERVISION: Inability to self-administer medication needs to be constantly addressed. Implement a mechanism to ensure safe administration of the medications.  RECOMMENDATIONS FOR ALL PATIENTS WITH MEMORY PROBLEMS: 1. Continue to exercise (Recommend 30 minutes of walking everyday, or 3 hours every week) 2. Increase social interactions - continue going to [employer] and enjoy social gatherings with friends and family 3. Eat healthy, avoid fried foods and eat more fruits and vegetables 4. Maintain adequate blood pressure, blood sugar, and blood cholesterol level. Reducing the risk of stroke and cardiovascular disease also helps promoting better memory. 5. Avoid stressful situations. Live a simple life and avoid aggravations. Organize your time and prepare for the next day in anticipation. 6. Sleep well, avoid any interruptions of sleep and avoid any distractions in the bedroom that may interfere with adequate sleep quality 7. Avoid sugar, avoid sweets as there is a strong link between excessive sugar intake, diabetes, and cognitive impairment The Mediterranean diet has been shown to help patients reduce the risk of progressive memory disorders and reduces cardiovascular risk. This includes eating fish, eat fruits and green leafy vegetables, nuts like almonds and hazelnuts, walnuts, and also use olive oil. Avoid fast foods and fried foods as much as possible. Avoid sweets and sugar as sugar use has been linked to worsening of memory function.  There is always a concern of gradual progression of memory problems. If this is the case, then we may need to adjust level of care according to patient needs.  Support, both to the patient and caregiver, should then be put into place.

## 2018-10-13 ENCOUNTER — Encounter: Payer: Self-pay | Admitting: Adult Health

## 2018-10-13 ENCOUNTER — Ambulatory Visit (INDEPENDENT_AMBULATORY_CARE_PROVIDER_SITE_OTHER): Payer: Medicare HMO | Admitting: Adult Health

## 2018-10-13 VITALS — BP 124/66 | Temp 98.3°F | Wt 197.0 lb

## 2018-10-13 DIAGNOSIS — E1165 Type 2 diabetes mellitus with hyperglycemia: Secondary | ICD-10-CM

## 2018-10-13 DIAGNOSIS — E118 Type 2 diabetes mellitus with unspecified complications: Secondary | ICD-10-CM | POA: Diagnosis not present

## 2018-10-13 DIAGNOSIS — R972 Elevated prostate specific antigen [PSA]: Secondary | ICD-10-CM | POA: Diagnosis not present

## 2018-10-13 DIAGNOSIS — IMO0002 Reserved for concepts with insufficient information to code with codable children: Secondary | ICD-10-CM

## 2018-10-13 LAB — BASIC METABOLIC PANEL
BUN: 17 mg/dL (ref 6–23)
CO2: 32 mEq/L (ref 19–32)
Calcium: 9.5 mg/dL (ref 8.4–10.5)
Chloride: 102 mEq/L (ref 96–112)
Creatinine, Ser: 1.46 mg/dL (ref 0.40–1.50)
GFR: 56.3 mL/min — ABNORMAL LOW (ref >60.00)
Glucose, Bld: 92 mg/dL (ref 70–99)
Potassium: 4.7 mEq/L (ref 3.5–5.1)
SODIUM: 138 meq/L (ref 135–145)

## 2018-10-13 LAB — PSA: PSA: 4.29 ng/mL — ABNORMAL HIGH (ref 0.10–4.00)

## 2018-10-13 LAB — HEMOGLOBIN A1C: Hgb A1c MFr Bld: 7.3 % — ABNORMAL HIGH (ref 4.6–6.5)

## 2018-10-13 NOTE — Progress Notes (Signed)
Subjective:    Patient ID: Scott Vasquez, male    DOB: [birth year], 78 y.o.   MRN: [id number]  HPI 78 year old male who  has a past medical history of Anemia, Diabetes mellitus, Diverticulosis of colon, Hypertension, Stroke ([place]), and Thyroid nodule.  He presents to the office today for follow-up regarding diabetes and elevated PSA levels.  DM-currently prescribed Actos 15 mg, Amaryl 1 mg and Trulicity 1.5 mg.  He has been monitoring his blood sugars at home and reports readings of 100-1 20.  He denies symptoms of hypoglycemia.  He is not having any diarrhea nausea or abdominal pain.  Overall he states I feel really good".  During the last visit his A1c had dropped from 9.2-6.7.  Lab Results  Component Value Date   HGBA1C 6.7 07/14/2018   PSA -slightly elevated over the last year.  He was given the option of seeking further evaluation by urology or continuing to watch this level via blood work.  He opted on routine PSA screenings.  Denies worsening symptoms of BPH or pain with bowel movements.   Review of Systems See HPI   Past Medical History:  Diagnosis Date  . Anemia    iron deficiency  . Diabetes mellitus    type II  . Diverticulosis of colon   . Hypertension   . Stroke ([place])    numbness in left hand  . Thyroid nodule     Social History   Socioeconomic History  . Marital status: Married    Spouse name: Not on file  . Number of children: Not on file  . Years of education: Not on file  . Highest education level: Not on file  Occupational History  . Occupation: retired    Fish farm manager: [employer]  . Financial resource strain: Not on file  . Food insecurity:    Worry: Not on file    Inability: Not on file  . Transportation needs:    Medical: Not on file    Non-medical: Not on file  Tobacco Use  . Smoking status: Former Smoker    Packs/day: 0.50    Years: 20.00    Pack years: 10.00    Types: Cigarettes    Start date: 1953    Last attempt to quit:  08/10/1958    Years since quitting: 60.2  . Smokeless tobacco: Never Used  Substance and Sexual Activity  . Alcohol use: No  . Drug use: No  . Sexual activity: Not on file  Lifestyle  . Physical activity:    Days per week: Not on file    Minutes per session: Not on file  . Stress: Not on file  Relationships  . Social connections:    Talks on phone: Not on file    Gets together: Not on file    Attends religious service: Not on file    Active member of club or organization: Not on file    Attends meetings of clubs or organizations: Not on file    Relationship status: Not on file  . Intimate partner violence:    Fear of current or ex partner: Not on file    Emotionally abused: Not on file    Physically abused: Not on file    Forced sexual activity: Not on file  Other Topics Concern  . Not on file  Social History Narrative   Pt is R handed   Lives in single story home with his wife, [name]   2  adult children   8th grade education   Retired Theatre stage manager from [employer]   Married 56 years    Past Surgical History:  Procedure Laterality Date  . BACK SURGERY    . CATARACT EXTRACTION, BILATERAL    . SHOULDER SURGERY  08/2007   Supple  . THYROID LOBECTOMY Left 04/29/2015  . THYROID LOBECTOMY Left 04/29/2015   Procedure: THYROID LOBECTOMY;  Surgeon: Armandina Gemma, MD;  Location: [facility] OR;  Service: General;  Laterality: Left;    Family History  Problem Relation Age of Onset  . Diabetes Other   . Diabetes Father        Died from diabetic coma in 09/25/70  . Hypertension Father   . Stroke Mother        Died in 19  . Hypertension Mother     Allergies  Allergen Reactions  . Actos [Pioglitazone]     Diarrhea   . Metformin And Related     Diarrhea      Current Outpatient Medications on File Prior to Visit  Medication Sig Dispense Refill  . atorvastatin (LIPITOR) 40 MG tablet TAKE 1 TABLET BY MOUTH ONCE DAILY 90 tablet 2  . Azelastine-Fluticasone 137-50 MCG/ACT SUSP Place 1  spray into the nose 2 (two) times daily. 23 g 3  . Blood Glucose Monitoring Suppl (ONE TOUCH ULTRA SYSTEM KIT) W/DEVICE KIT Use to check blood sugar twice a day 1 each 0  . citalopram (CELEXA) 10 MG tablet Take 1 tablet (10 mg total) by mouth daily. 30 tablet 11  . clopidogrel (PLAVIX) 75 MG tablet TAKE ONE TABLET BY MOUTH EVERY DAY 90 tablet 0  . cyanocobalamin 100 MCG tablet Take 100 mcg by mouth daily.    Marland Kitchen donepezil (ARICEPT) 23 MG TABS tablet TAKE ONE TABLET BY MOUTH AT BEDTIME 90 tablet 3  . Dulaglutide (TRULICITY) 1.5 YE/3.3ID SOPN Inject 1.5 mg into the skin once a week.    Marland Kitchen glimepiride (AMARYL) 1 MG tablet TAKE ONE TABLET BY MOUTH EVERY DAY WITH BREAKFAST 90 tablet 0  . glucose blood (ONE TOUCH ULTRA TEST) test strip USE TO CHECK BLOOD SUGAR TWICE DAILY 100 each 3  . ipratropium (ATROVENT) 0.03 % nasal spray Place 2 sprays into both nostrils every 12 (twelve) hours. 30 mL 12  . levocetirizine (XYZAL) 5 MG tablet TAKE ONE TABLET BY MOUTH EVERY EVENING. 30 tablet 5  . levothyroxine (SYNTHROID, LEVOTHROID) 50 MCG tablet TAKE ONE TABLET BY MOUTH ONCE DAILY 90 tablet 2  . lisinopril (PRINIVIL,ZESTRIL) 40 MG tablet Take 1 tablet (40 mg total) by mouth daily. 90 tablet 3  . meloxicam (MOBIC) 7.5 MG tablet TAKE ONE TABLET BY MOUTH ONCE DAILY. 30 tablet 0  . ONETOUCH DELICA LANCETS 56Y MISC 1 each by Other route See admin instructions. Check blood sugar twice daily  0  . pioglitazone (ACTOS) 15 MG tablet TAKE ONE TABLET BY MOUTH DAILY. 90 tablet 0  . PROAIR HFA 108 (90 Base) MCG/ACT inhaler INHALE TWO PUFFS INTO THE LUNGS EVERY 6 HOURS AS NEEDED FOR FOR WHEEZING OR SHORTNESS OF BREATH. 8.5 g 0  . tamsulosin (FLOMAX) 0.4 MG CAPS capsule TAKE ONE CAPSULE BY MOUTH EVERY DAY 90 capsule 0   No current facility-administered medications on file prior to visit.     BP 124/66   Temp 98.3 F (36.8 C)   Wt 197 lb (89.4 kg)   BMI 27.87 kg/m       Objective:   Physical Exam Vitals signs and  nursing note reviewed.  Constitutional:      Appearance: Normal appearance.  Cardiovascular:     Rate and Rhythm: Normal rate and regular rhythm.     Pulses: Normal pulses.     Heart sounds: Normal heart sounds.  Pulmonary:     Effort: Pulmonary effort is normal.     Breath sounds: Normal breath sounds.  Abdominal:     General: Abdomen is flat.     Palpations: Abdomen is soft.  Skin:    General: Skin is warm and dry.     Capillary Refill: Capillary refill takes less than 2 seconds.  Neurological:     General: No focal deficit present.     Mental Status: He is alert and oriented to person, place, and time.  Psychiatric:        Mood and Affect: Mood normal.        Behavior: Behavior normal.        Thought Content: Thought content normal.        Judgment: Judgment normal.       Assessment & Plan:  1. Elevated PSA - Consider urology referral  - PSA  2. Diabetes mellitus type 2 with complications, uncontrolled ([place]) - Consider change in dose of medications  - Basic Metabolic Panel - Hemoglobin A1c  [name], NP

## 2018-10-17 ENCOUNTER — Other Ambulatory Visit: Payer: Self-pay | Admitting: Pharmacy Technician

## 2018-10-17 NOTE — Patient Outreach (Signed)
[place] [facility]) Care Management  10/17/2018  Login [name] [name] 1941/03/18 [id number]    Unsuccessful call #1 placed to patient regarding patient assistance application(s) for Trulicity , HIPAA compliant voicemail left.    Follow up:  Will make 2nd call attempt in 2-3 business days.  Maud Deed Chana Bode [place] Certified Pharmacy Technician [place] Management Direct Dial:[phone]

## 2018-10-18 ENCOUNTER — Other Ambulatory Visit: Payer: Self-pay | Admitting: Adult Health

## 2018-10-19 ENCOUNTER — Encounter: Payer: Self-pay | Admitting: Neurology

## 2018-10-19 NOTE — Telephone Encounter (Signed)
Sent to the pharmacy by e-scribe. 

## 2018-10-20 ENCOUNTER — Ambulatory Visit
Admission: RE | Admit: 2018-10-20 | Discharge: 2018-10-20 | Disposition: A | Discharge status: Home / Self Care | Payer: Medicare HMO | Source: Ambulatory Visit | Attending: Neurology | Admitting: Neurology

## 2018-10-20 ENCOUNTER — Other Ambulatory Visit: Payer: Self-pay

## 2018-10-20 DIAGNOSIS — R413 Other amnesia: Secondary | ICD-10-CM | POA: Diagnosis not present

## 2018-11-14 ENCOUNTER — Other Ambulatory Visit: Payer: Self-pay | Admitting: Adult Health

## 2018-11-14 NOTE — Telephone Encounter (Signed)
Sent to the pharmacy by e-scribe for 90 days. 

## 2018-12-05 ENCOUNTER — Other Ambulatory Visit: Payer: Self-pay | Admitting: Pharmacy Technician

## 2018-12-05 NOTE — Patient Outreach (Signed)
[place] [facility]) Care Management  12/05/2018  Kaelan Amble Kier [birth year] [id number]    Unsuccessful call #2 placed to patient regarding patient assistance application(s) for Trulicity , HIPAA compliant voicemail left.   Follow up:  Will make 3rd call attempt in 2-3 business days if call has not been returned.  Maud Deed Chana Bode [place] Certified Pharmacy Technician [place] Management Direct [phone]

## 2018-12-08 ENCOUNTER — Other Ambulatory Visit: Payer: Self-pay | Admitting: Adult Health

## 2018-12-08 DIAGNOSIS — E1165 Type 2 diabetes mellitus with hyperglycemia: Secondary | ICD-10-CM

## 2018-12-08 DIAGNOSIS — E118 Type 2 diabetes mellitus with unspecified complications: Principal | ICD-10-CM

## 2018-12-08 DIAGNOSIS — IMO0002 Reserved for concepts with insufficient information to code with codable children: Secondary | ICD-10-CM

## 2018-12-09 NOTE — Telephone Encounter (Signed)
Sent to the pharmacy by e-scribe. 

## 2018-12-13 ENCOUNTER — Telehealth: Payer: Self-pay | Admitting: Neurology

## 2018-12-13 NOTE — Telephone Encounter (Signed)
Pls let wife know the brain scan did not show any evidence of tumor, stroke, or bleed. It did show continued age-related changes and hardening of the small blood vessels in the brain, seen in patients with blood pressure and cholesterol issues. Pls ask Scott Vasquez about POA paperwork and mail to wife, thanks!

## 2018-12-13 NOTE — Telephone Encounter (Signed)
Wife calling in about results from a brain scan and also wanted to talk about POA paperwork. Thanks!

## 2018-12-14 NOTE — Telephone Encounter (Signed)
Pt wife called given scan results per dr Delice Lesch let wife know the brain scan did not show any evidence of tumor, stroke, or bleed. It did show continued age-related changes and hardening of the small blood vessels in the brain, seen in patients with blood pressure and cholesterol issues , POA paper work put in the mail today for her

## 2018-12-16 ENCOUNTER — Other Ambulatory Visit: Payer: Self-pay | Admitting: Pharmacy Technician

## 2018-12-16 NOTE — Patient Outreach (Addendum)
[facility]) Care Management  12/16/2018  Navon Kotowski Kreeger Feb 03, 1941 [id number]   HAve not been able to get in touch with patient thru landline nor cellphone number to follow up with [place] patient assistance for Trulicity.  Currently patiently in ineligible for [place] management services.  Will remove myself from care team.  Maud Deed. Chana Bode [place] Certified Pharmacy Technician [place] Management Direct Dial:[phone]

## 2018-12-20 ENCOUNTER — Other Ambulatory Visit: Payer: Self-pay | Admitting: Adult Health

## 2018-12-21 NOTE — Telephone Encounter (Signed)
Sent to the pharmacy by e-scribe. 

## 2019-01-03 ENCOUNTER — Other Ambulatory Visit: Payer: Self-pay | Admitting: Adult Health

## 2019-01-03 DIAGNOSIS — R350 Frequency of micturition: Secondary | ICD-10-CM

## 2019-01-03 DIAGNOSIS — I10 Essential (primary) hypertension: Secondary | ICD-10-CM

## 2019-01-04 NOTE — Telephone Encounter (Signed)
Tamsulosin sent in for 90 days.  Lisinopril was filled for 90 days on 01/03/2019

## 2019-01-12 ENCOUNTER — Other Ambulatory Visit: Payer: Self-pay | Admitting: Family Medicine

## 2019-01-12 ENCOUNTER — Other Ambulatory Visit (INDEPENDENT_AMBULATORY_CARE_PROVIDER_SITE_OTHER): Payer: Medicare HMO

## 2019-01-12 ENCOUNTER — Other Ambulatory Visit: Payer: Self-pay

## 2019-01-12 DIAGNOSIS — E1165 Type 2 diabetes mellitus with hyperglycemia: Secondary | ICD-10-CM

## 2019-01-12 DIAGNOSIS — IMO0002 Reserved for concepts with insufficient information to code with codable children: Secondary | ICD-10-CM

## 2019-01-12 DIAGNOSIS — E118 Type 2 diabetes mellitus with unspecified complications: Secondary | ICD-10-CM | POA: Diagnosis not present

## 2019-01-12 LAB — HEMOGLOBIN A1C: Hgb A1c MFr Bld: 6.7 % — ABNORMAL HIGH (ref 4.6–6.5)

## 2019-01-13 ENCOUNTER — Other Ambulatory Visit: Payer: Self-pay

## 2019-01-13 ENCOUNTER — Ambulatory Visit (INDEPENDENT_AMBULATORY_CARE_PROVIDER_SITE_OTHER): Payer: Medicare HMO | Admitting: Adult Health

## 2019-01-13 ENCOUNTER — Encounter: Payer: Self-pay | Admitting: Adult Health

## 2019-01-13 DIAGNOSIS — E118 Type 2 diabetes mellitus with unspecified complications: Secondary | ICD-10-CM

## 2019-01-13 DIAGNOSIS — E1165 Type 2 diabetes mellitus with hyperglycemia: Secondary | ICD-10-CM | POA: Diagnosis not present

## 2019-01-13 DIAGNOSIS — IMO0002 Reserved for concepts with insufficient information to code with codable children: Secondary | ICD-10-CM

## 2019-01-13 NOTE — Progress Notes (Signed)
Virtual Visit via Telephone Note  I connected with Scott Vasquez on 01/13/19 at  9:30 AM EDT by telephone and verified that I am speaking with the correct person using two identifiers.   I discussed the limitations, risks, security and privacy concerns of performing an evaluation and management service by telephone and the availability of in person appointments. I also discussed with the patient that there may be a patient responsible charge related to this service. The patient expressed understanding and agreed to proceed.  Location patient: home Location provider: work or home office Participants present for the call: patient, provider Patient did not have a visit in the prior 7 days to address this/these issue(s).   History of Present Illness: 78 year old male who  has a past medical history of Anemia, Diabetes mellitus, Diverticulosis of colon, Hypertension, Stroke ([place]), and Thyroid nodule.  Today is a 52-month follow-up regarding diabetes.  He is currently prescribed Actos 15 mg, Amaryl 1 mg, and Trulicity 1.5 mg.  He has been monitoring his blood sugars at home and reports readings between 90 and 120.  He has not had any symptoms of hypoglycemia.  He is trying to eat healthier and drink more water throughout the day.   His last A1c in March 2020 was 7.3  Overall he has no complaints   Observations/Objective: Patient sounds cheerful and well on the phone. I do not appreciate any SOB. Speech and thought processing are grossly intact. Patient reported vitals:  Assessment and Plan: 1. Diabetes mellitus type 2 with complications, uncontrolled (HCC) -A1c has improved to 6.7.  Keep him on his current regimen for the time being in the future will consider stopping Amaryl.  Follow-up in 3 months   Follow Up Instructions:   I did not refer this patient for an OV in the next 24 hours for this/these issue(s).  I discussed the assessment and treatment plan with the patient. The patient  was provided an opportunity to ask questions and all were answered. The patient agreed with the plan and demonstrated an understanding of the instructions.   The patient was advised to call back or seek an in-person evaluation if the symptoms worsen or if the condition fails to improve as anticipated.  I provided 15 minutes of non-face-to-face time during this encounter.   [name], NP

## 2019-01-17 ENCOUNTER — Other Ambulatory Visit: Payer: Self-pay | Admitting: Adult Health

## 2019-01-18 NOTE — Telephone Encounter (Signed)
Sent to the pharmacy by e-scribe for 90 days. 

## 2019-02-22 ENCOUNTER — Other Ambulatory Visit: Payer: Self-pay | Admitting: Adult Health

## 2019-02-23 ENCOUNTER — Encounter: Payer: Self-pay | Admitting: Adult Health

## 2019-02-23 DIAGNOSIS — E119 Type 2 diabetes mellitus without complications: Secondary | ICD-10-CM | POA: Diagnosis not present

## 2019-02-23 DIAGNOSIS — H40053 Ocular hypertension, bilateral: Secondary | ICD-10-CM | POA: Diagnosis not present

## 2019-02-23 DIAGNOSIS — Z961 Presence of intraocular lens: Secondary | ICD-10-CM | POA: Diagnosis not present

## 2019-02-23 LAB — HM DIABETES EYE EXAM

## 2019-02-23 NOTE — Telephone Encounter (Signed)
Ok to refill for 90 +1 but lets get him scheduled for a CPE

## 2019-02-24 ENCOUNTER — Encounter: Payer: Self-pay | Admitting: Family Medicine

## 2019-02-24 NOTE — Telephone Encounter (Signed)
Sent to the pharmacy by e-scribe.  Letter sent by mail.

## 2019-03-16 ENCOUNTER — Other Ambulatory Visit: Payer: Self-pay | Admitting: Adult Health

## 2019-03-16 DIAGNOSIS — IMO0002 Reserved for concepts with insufficient information to code with codable children: Secondary | ICD-10-CM

## 2019-03-16 DIAGNOSIS — E1165 Type 2 diabetes mellitus with hyperglycemia: Secondary | ICD-10-CM

## 2019-03-17 NOTE — Telephone Encounter (Signed)
Sent to the pharmacy by e-scribe.  Pt due next month for A1C check up.

## 2019-03-27 ENCOUNTER — Other Ambulatory Visit: Payer: Self-pay | Admitting: Adult Health

## 2019-03-28 ENCOUNTER — Other Ambulatory Visit: Payer: Self-pay | Admitting: Adult Health

## 2019-03-29 NOTE — Telephone Encounter (Signed)
#  3O SENT TO THE PHARMACY BY E-SCRIBE.  DUE FOR CPX.  LETTER ALSO MAILED IN July.

## 2019-04-06 ENCOUNTER — Other Ambulatory Visit: Payer: Self-pay

## 2019-04-06 MED ORDER — CITALOPRAM HYDROBROMIDE 10 MG PO TABS: 10.0000 mg | ORAL_TABLET | Freq: Every day | ORAL | 8 refills | Status: DC

## 2019-04-18 ENCOUNTER — Telehealth: Payer: Self-pay | Admitting: Adult Health

## 2019-04-18 DIAGNOSIS — I10 Essential (primary) hypertension: Secondary | ICD-10-CM

## 2019-04-18 DIAGNOSIS — IMO0002 Reserved for concepts with insufficient information to code with codable children: Secondary | ICD-10-CM

## 2019-04-18 DIAGNOSIS — E1165 Type 2 diabetes mellitus with hyperglycemia: Secondary | ICD-10-CM

## 2019-04-18 DIAGNOSIS — R350 Frequency of micturition: Secondary | ICD-10-CM

## 2019-04-18 NOTE — Telephone Encounter (Signed)
PA needed for prescriptions with [facility]

## 2019-04-19 NOTE — Telephone Encounter (Signed)
Pt wife is confused about what meds he needs but states that he is out of some of them and wants a nurse call back. He needs more than just the PA. States we can call the ins company to see what he needs.Marland KitchenMarland KitchenMarland Kitchen

## 2019-04-20 MED ORDER — CLOPIDOGREL BISULFATE 75 MG PO TABS: 75.0000 mg | ORAL_TABLET | Freq: Every day | ORAL | 0 refills | Status: DC

## 2019-04-20 MED ORDER — LISINOPRIL 40 MG PO TABS: 40.0000 mg | ORAL_TABLET | Freq: Every day | ORAL | 0 refills | Status: DC

## 2019-04-20 MED ORDER — GLIMEPIRIDE 1 MG PO TABS: ORAL_TABLET | ORAL | 0 refills | Status: DC

## 2019-04-20 MED ORDER — TAMSULOSIN HCL 0.4 MG PO CAPS: 0.4000 mg | ORAL_CAPSULE | Freq: Every day | ORAL | 0 refills | Status: DC

## 2019-04-20 MED ORDER — ATORVASTATIN CALCIUM 40 MG PO TABS: 40.0000 mg | ORAL_TABLET | Freq: Every day | ORAL | 0 refills | Status: DC

## 2019-04-20 MED ORDER — PIOGLITAZONE HCL 15 MG PO TABS: 15.0000 mg | ORAL_TABLET | Freq: Every day | ORAL | 0 refills | Status: DC

## 2019-04-20 MED ORDER — LEVOTHYROXINE SODIUM 50 MCG PO TABS: 50.0000 ug | ORAL_TABLET | Freq: Every day | ORAL | 0 refills | Status: DC

## 2019-04-20 NOTE — Telephone Encounter (Signed)
Spoke to Mrs. Lacount.  Pt is switching to mail order pharmacy and would like his prescriptions sent to [facility].  Will send in.

## 2019-05-05 ENCOUNTER — Telehealth: Payer: Self-pay

## 2019-05-05 ENCOUNTER — Telehealth: Payer: Self-pay | Admitting: Adult Health

## 2019-05-05 MED ORDER — DONEPEZIL HCL 23 MG PO TABS: 23.0000 mg | ORAL_TABLET | Freq: Every day | ORAL | 0 refills | Status: DC

## 2019-05-05 NOTE — Telephone Encounter (Signed)
Sent to the pharmacy by e-scribe for 90 days. 

## 2019-05-05 NOTE — Telephone Encounter (Signed)
Copied from [place] [id number]. Topic: General - Other >> May 05, 2019  3:43 PM Leward Quan A wrote: Reason for CRM: [place], [place] - [place] [phone] (Phone) [phone] (Fax)  Called to request an Rx for donepezil (ARICEPT) 23 MG TABS tablet. Patient already have refill at his mail order pharmacy but he is out and need a supply till the other arrive.

## 2019-05-05 NOTE — Telephone Encounter (Signed)
The patient needs a refill before the weekend. He is completely out of his medication.  donepezil (ARICEPT) 23 MG TABS tabl   Sent to:  [place], [place] - [place] [phone] (Phone) [phone] (Fax)

## 2019-05-05 NOTE — Addendum Note (Signed)
Addended by: Miles Costain T on: 05/05/2019 03:53 PM   Modules accepted: Orders

## 2019-05-09 NOTE — Telephone Encounter (Signed)
Patient states retail pharmacy mentioned below gave him a week supply of donepezil (ARICEPT) 23 MG TABS tablet but the remaining supply should have went to [facility] (3 month supply)  [place]

## 2019-05-10 MED ORDER — DONEPEZIL HCL 23 MG PO TABS: 23.0000 mg | ORAL_TABLET | Freq: Every day | ORAL | 0 refills | Status: DC

## 2019-05-10 NOTE — Addendum Note (Signed)
Addended by: Miles Costain T on: 05/10/2019 01:16 PM   Modules accepted: Orders

## 2019-05-10 NOTE — Telephone Encounter (Signed)
Sent to the pharmacy by e-scribe. 

## 2019-05-18 ENCOUNTER — Telehealth: Payer: Self-pay | Admitting: Family Medicine

## 2019-05-18 ENCOUNTER — Encounter: Payer: Self-pay | Admitting: Family Medicine

## 2019-05-18 DIAGNOSIS — F03B18 Unspecified dementia, moderate, with other behavioral disturbance: Secondary | ICD-10-CM | POA: Insufficient documentation

## 2019-05-18 DIAGNOSIS — F0391 Unspecified dementia with behavioral disturbance: Secondary | ICD-10-CM | POA: Insufficient documentation

## 2019-05-18 DIAGNOSIS — F03A0 Unspecified dementia, mild, without behavioral disturbance, psychotic disturbance, mood disturbance, and anxiety: Secondary | ICD-10-CM | POA: Insufficient documentation

## 2019-05-18 NOTE — Telephone Encounter (Signed)
Received PA form and submitted by fax to [facility].   Phone - [phone] Fax [phone]  Waiting on a determination

## 2019-05-23 NOTE — Telephone Encounter (Signed)
PA request was denied.  Pt must try and fail donepezil 5 mg and 10 mg along with galantamine and mementine tabs.  Please advise.

## 2019-05-24 MED ORDER — DONEPEZIL HCL 10 MG PO TABS: 10.0000 mg | ORAL_TABLET | Freq: Every day | ORAL | 0 refills | Status: DC

## 2019-05-24 NOTE — Telephone Encounter (Signed)
Ok to go back to Aricept 10 mg QHS

## 2019-05-24 NOTE — Addendum Note (Signed)
Addended by: Miles Costain T on: 05/24/2019 07:27 AM   Modules accepted: Orders

## 2019-05-24 NOTE — Telephone Encounter (Signed)
Sent to the pharmacy by e-scribe. 

## 2019-05-25 ENCOUNTER — Telehealth: Payer: Self-pay | Admitting: Neurology

## 2019-05-25 NOTE — Telephone Encounter (Signed)
Patient wife would like to speak to someone before the appt tomorrow 05-26-19 about what is going on with patient  Please call

## 2019-05-26 ENCOUNTER — Other Ambulatory Visit: Payer: Self-pay

## 2019-05-26 ENCOUNTER — Encounter: Payer: Self-pay | Admitting: Neurology

## 2019-05-26 ENCOUNTER — Ambulatory Visit (INDEPENDENT_AMBULATORY_CARE_PROVIDER_SITE_OTHER): Payer: Medicare HMO | Admitting: Neurology

## 2019-05-26 VITALS — BP 134/62 | HR 70 | Ht 70.5 in | Wt 200.2 lb

## 2019-05-26 DIAGNOSIS — F0391 Unspecified dementia with behavioral disturbance: Secondary | ICD-10-CM

## 2019-05-26 DIAGNOSIS — F03B18 Unspecified dementia, moderate, with other behavioral disturbance: Secondary | ICD-10-CM

## 2019-05-26 MED ORDER — CITALOPRAM HYDROBROMIDE 10 MG PO TABS: 10.0000 mg | ORAL_TABLET | Freq: Every day | ORAL | 3 refills | Status: DC

## 2019-05-26 MED ORDER — MEMANTINE HCL 10 MG PO TABS: ORAL_TABLET | ORAL | 5 refills | Status: DC

## 2019-05-26 NOTE — Patient Instructions (Signed)
1. Start Memantine 10mg : take 1/2 tablet every night for 1 week, then increase to 1/2 tablet twice a day for 1 week, then increase to 1 tablet twice a day and continue. If no side effects, call our office and we will send the prescription to [facility]  2. Continue Citalopram 10mg   Daily and Donepezil 10mg  daily  3. Continue to monitor driving  4. Follow-up in 6 months, call for any changes

## 2019-05-26 NOTE — Progress Notes (Signed)
NEUROLOGY FOLLOW UP OFFICE NOTE  Scott Vasquez [id number] 10-16-1940  HISTORY OF PRESENT ILLNESS: I had the pleasure of seeing Scott Vasquez in follow-up in the neurology clinic on 05/26/2019.  The patient was last seen 7 months ago for moderate dementia with behavioral disturbance. He is accompanied by his wife who helps supplement the history today. [place] score 11/30 in March 2020. He is on Donepezil 62m daily. I personally reviewed MRI brain without contrast done 10/2018 which did not show any acute changes, compared to MRI in 2016, there has been progression of atrophy, chronic microvascular disease, and chronic microhemorrhage in the thalamus and brainstem bilaterally. Since his last visit, his wife reports good and bad days, mood is up and down. His wife reports he has been out of citalopram. He denies getting lost driving. He manages his own medications, they state he is pretty good except with his insulin. Sleep is good, he is sleepy during the day. His wife notes increased irritability, he gets upset when trying to find something. He denies any headaches, focal numbness/tingling/weakness. He has occasional dizziness, no falls.   History on Initial Assessment 10/12/2018: This is a 78year old right-handed man with a history of hypertension, hyperlipidemia, diabetes, subcortical stroke in 2001 with residual left hand tingling, presenting for evaluation of memory loss. He states his memory is bad, he started noticing changes a couple of years ago. He deneis getting lost driving. He misses bills because "there is no money to pay them." He manages his own medications and denies missing doses, his wife agrees. He was reporting difficulty remembering what he read in the bible or magazine after he finishes reading. He also has difficulty remembering names. His wife states that yesterday the bank called her because he was there and could not remember his social security number. She reports he got lost last month  when he made a wrong turn. She reports other bills are paid but sometimes late, he has not paid taxes and car insurance from last year, he told her he will pay when ready. His wife asked to speak privately about her concerns, she is asking for a pill to "calm him down." She reports he is a "time bomb in the morning, I am his worst enemy." He would sit watching TV all day. Symptoms started last summer, he started sleeping upstairs because he was getting irritated with people. Their daughter lives with them. She denies any paranoia or hallucinations. Sleep is good. He is independent with dressing and bathing.  He denies any headaches, dizziness, diplopia, dysarthria/dysphagia, neck/back pain, focal weakness, bowel/bladder dysfunction, anosmia, or tremors. He has chronic left hand tingling since his stroke. He has had nasal discharge since throat surgery in 2017. He has been on Donepezil since 2017, no side effects. No family history of dementia. No history of significant head injuries or alcohol use.  I personally reviewed MRI brain in 2016 showing an acute punctate nonhemorrhagic infarct within the left paramedian superior midbrain and inferomedial thalamus, multiple remote bilateral lacunar infarcts, advanced chronic microvascular disease, multiple foci of remote hemorrhage within the thalami.    PAST MEDICAL HISTORY: Past Medical History:  Diagnosis Date   Anemia    iron deficiency   Diabetes mellitus    type II   Diverticulosis of colon    Hypertension    Stroke (HCC)    numbness in left hand   Thyroid nodule     MEDICATIONS: Current Outpatient Medications on File Prior to Visit  Medication Sig Dispense Refill   atorvastatin (LIPITOR) 40 MG tablet Take 1 tablet (40 mg total) by mouth daily. 90 tablet 0   Azelastine-Fluticasone 137-50 MCG/ACT SUSP Place 1 spray into the nose 2 (two) times daily. 23 g 3   Blood Glucose Monitoring Suppl (ONE TOUCH ULTRA SYSTEM KIT) W/DEVICE KIT Use  to check blood sugar twice a day 1 each 0   citalopram (CELEXA) 10 MG tablet Take 1 tablet (10 mg total) by mouth daily. 30 tablet 8   clopidogrel (PLAVIX) 75 MG tablet Take 1 tablet (75 mg total) by mouth daily. 90 tablet 0   cyanocobalamin 100 MCG tablet Take 100 mcg by mouth daily.     donepezil (ARICEPT) 10 MG tablet Take 1 tablet (10 mg total) by mouth at bedtime. 90 tablet 0   Dulaglutide (TRULICITY) 1.5 YS/0.6TK SOPN Inject 1.5 mg into the skin once a week.     glimepiride (AMARYL) 1 MG tablet TAKE ONE TABLET BY MOUTH EVERY DAY WITH BREAKFAST 90 tablet 0   glucose blood (ONE TOUCH ULTRA TEST) test strip USE TO CHECK BLOOD SUGAR TWICE DAILY 100 each 3   ipratropium (ATROVENT) 0.03 % nasal spray Place 2 sprays into both nostrils every 12 (twelve) hours. 30 mL 12   levocetirizine (XYZAL) 5 MG tablet TAKE ONE TABLET BY MOUTH EVERY EVENING. 30 tablet 5   levothyroxine (SYNTHROID) 50 MCG tablet Take 1 tablet (50 mcg total) by mouth daily. 90 tablet 0   lisinopril (ZESTRIL) 40 MG tablet Take 1 tablet (40 mg total) by mouth daily. 90 tablet 0   meloxicam (MOBIC) 7.5 MG tablet TAKE ONE TABLET BY MOUTH ONCE DAILY. 30 tablet 0   ONETOUCH DELICA LANCETS 16W MISC 1 each by Other route See admin instructions. Check blood sugar twice daily  0   pioglitazone (ACTOS) 15 MG tablet Take 1 tablet (15 mg total) by mouth daily. 90 tablet 0   PROAIR HFA 108 (90 Base) MCG/ACT inhaler INHALE TWO PUFFS INTO THE LUNGS EVERY 6 HOURS AS NEEDED FOR FOR WHEEZING OR SHORTNESS OF BREATH. 8.5 g 0   tamsulosin (FLOMAX) 0.4 MG CAPS capsule Take 1 capsule (0.4 mg total) by mouth daily. 90 capsule 0   No current facility-administered medications on file prior to visit.     ALLERGIES: Allergies  Allergen Reactions   Actos [Pioglitazone]     Diarrhea    Metformin And Related     Diarrhea      FAMILY HISTORY: Family History  Problem Relation Age of Onset   Diabetes Other    Diabetes Father         Died from diabetic coma in 11-01-70   Hypertension Father    Stroke Mother        Died in 61   Hypertension Mother     SOCIAL HISTORY: Social History   Socioeconomic History   Marital status: Married    Spouse name: Not on file   Number of children: Not on file   Years of education: Not on file   Highest education level: Not on file  Occupational History   Occupation: retired    Fish farm manager: [employer] resource strain: Not on file   Food insecurity    Worry: Not on file    Inability: Not on file   Transportation needs    Medical: Not on file    Non-medical: Not on file  Tobacco Use   Smoking status: Former Smoker  Packs/day: 0.50    Years: 20.00    Pack years: 10.00    Types: Cigarettes    Start date: 76    Quit date: 08/10/1958    Years since quitting: 60.8   Smokeless tobacco: Never Used  Substance and Sexual Activity   Alcohol use: No   Drug use: No   Sexual activity: Not on file  Lifestyle   Physical activity    Days per week: Not on file    Minutes per session: Not on file   Stress: Not on file  Relationships   Social connections    Talks on phone: Not on file    Gets together: Not on file    Attends religious service: Not on file    Active member of club or organization: Not on file    Attends meetings of clubs or organizations: Not on file    Relationship status: Not on file   Intimate partner violence    Fear of current or ex partner: Not on file    Emotionally abused: Not on file    Physically abused: Not on file    Forced sexual activity: Not on file  Other Topics Concern   Not on file  Social History Narrative   Pt is R handed   Lives in single story home with his wife, Scott Vasquez   2 adult children   8th grade education   Retired Theatre stage manager from [employer]   Married 75 years    REVIEW OF SYSTEMS: Constitutional: No fevers, chills, or sweats, no generalized fatigue, change in appetite Eyes: No  visual changes, double vision, eye pain Ear, nose and throat: No hearing loss, ear pain, nasal congestion, sore throat Cardiovascular: No chest pain, palpitations Respiratory:  No shortness of breath at rest or with exertion, wheezes GastrointestinaI: No nausea, vomiting, diarrhea, abdominal pain, fecal incontinence Genitourinary:  No dysuria, urinary retention or frequency Musculoskeletal:  No neck pain, back pain Integumentary: No rash, pruritus, skin lesions Neurological: as above Psychiatric: No depression, insomnia, anxiety Endocrine: No palpitations, fatigue, diaphoresis, mood swings, change in appetite, change in weight, increased thirst Hematologic/Lymphatic:  No anemia, purpura, petechiae. Allergic/Immunologic: no itchy/runny eyes, nasal congestion, recent allergic reactions, rashes  PHYSICAL EXAM: Vitals:   05/26/19 1530  BP: 134/62  Pulse: 70  SpO2: 98%   General: No acute distress Head:  Normocephalic/atraumatic Skin/Extremities: No rash, no edema Neurological Exam: alert and oriented to person, place, day of week. No aphasia or dysarthria. Fund of knowledge is appropriate.  Recent and remote memory areimpaired.  Attention and concentration are reduced. [place] Mental Exam 06/05/2019  Weekday Correct 1  Current year 0  What state are we in? 1  Amount spent 1  Amount left 0  # of Animals 0  5 objects recall 2  Number series 0  Hour markers 2  Time correct 2  Placed X in triangle correctly 1  Largest Figure 1  Name of male 0  Date back to work 0  Type of work 0  State she lived in 0  Total score 11   Cranial nerves: Pupils equal round. Extraocular movements intact with no nystagmus. Visual fields full. Facial sensation intact. No facial asymmetry. Tongue, uvula, palate midline.  Motor: Bulk and tone normal, muscle strength 5/5 throughout with no pronator drift.Finger to nose testing intact.  Gait narrow-based and steady, able to tandem walk  adequately.  Romberg negative.   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 SLUMS score today 11/30. MRI brain showed progressive atrophy, chronic microvascular disease, and chronic microhemorrhage in the the thalamus and brainstem (typically hypertensive). He is on Donepezil 71m daily, we discussed adding on Memantine, side effects and expectations discussed, start Memantine 15mqhs x 2 weeks, then increase to 1030mID. Restart citalopram. Continue to monitor driving. We again discussed the importance of control of vascular risk factors, physical exercise, and brain stimulation exercises for brain health. Follow-up in 6 months, they know to call for any changes.    Thank you for allowing me to participate in his care.  Please do not hesitate to call for any questions or concerns.    KarEllouise Newer.D.   CC: CorDorothyann PengP

## 2019-05-29 NOTE — Telephone Encounter (Signed)
Patient wife wanting to know if the memantine medication has been sent to the pharm on file. 30 day supply. Thanks!

## 2019-05-29 NOTE — Telephone Encounter (Signed)
Yes. Rx went to [facility] 05/26/19

## 2019-05-31 ENCOUNTER — Telehealth: Payer: Self-pay

## 2019-05-31 NOTE — Telephone Encounter (Signed)
Scott Vasquez notified that a different strength (10 mg) has been sent to [facility] since insurance will not cover donepezil 23 mg.  Nothing further needed.

## 2019-05-31 NOTE — Telephone Encounter (Signed)
Copied from [place] [id number]. Topic: General - Other >> May 31, 2019  4:39 PM Yvette Rack wrote: Reason for CRM: Pt wife stated they received a letter from [facility] stating an approval is needed for donepezil (ARICEPT) 10 MG tablet.

## 2019-06-05 ENCOUNTER — Encounter: Payer: Self-pay | Admitting: Neurology

## 2019-06-16 ENCOUNTER — Telehealth: Payer: Self-pay | Admitting: Neurology

## 2019-06-16 ENCOUNTER — Other Ambulatory Visit: Payer: Self-pay

## 2019-06-16 MED ORDER — MEMANTINE HCL 10 MG PO TABS: ORAL_TABLET | ORAL | 5 refills | Status: DC

## 2019-06-16 NOTE — Telephone Encounter (Signed)
Namenda sent to mail order pharmacy

## 2019-06-16 NOTE — Telephone Encounter (Signed)
Patient's wife called to report the patient's medication menantine 10 MG is working well and she'd like a prescription sent in to the pharmacy below.  [place]

## 2019-06-23 ENCOUNTER — Encounter: Payer: Self-pay | Admitting: Adult Health

## 2019-06-23 ENCOUNTER — Other Ambulatory Visit: Payer: Self-pay

## 2019-06-23 ENCOUNTER — Ambulatory Visit (INDEPENDENT_AMBULATORY_CARE_PROVIDER_SITE_OTHER): Payer: Medicare HMO | Admitting: Adult Health

## 2019-06-23 VITALS — BP 140/88 | Temp 97.0°F | Ht 69.5 in | Wt 200.0 lb

## 2019-06-23 DIAGNOSIS — Z0000 Encounter for general adult medical examination without abnormal findings: Secondary | ICD-10-CM | POA: Diagnosis not present

## 2019-06-23 DIAGNOSIS — E118 Type 2 diabetes mellitus with unspecified complications: Secondary | ICD-10-CM | POA: Diagnosis not present

## 2019-06-23 DIAGNOSIS — F0391 Unspecified dementia with behavioral disturbance: Secondary | ICD-10-CM

## 2019-06-23 DIAGNOSIS — E1165 Type 2 diabetes mellitus with hyperglycemia: Secondary | ICD-10-CM

## 2019-06-23 DIAGNOSIS — I6381 Other cerebral infarction due to occlusion or stenosis of small artery: Secondary | ICD-10-CM | POA: Diagnosis not present

## 2019-06-23 DIAGNOSIS — F03B18 Unspecified dementia, moderate, with other behavioral disturbance: Secondary | ICD-10-CM

## 2019-06-23 DIAGNOSIS — R972 Elevated prostate specific antigen [PSA]: Secondary | ICD-10-CM

## 2019-06-23 DIAGNOSIS — I10 Essential (primary) hypertension: Secondary | ICD-10-CM

## 2019-06-23 DIAGNOSIS — Z23 Encounter for immunization: Secondary | ICD-10-CM | POA: Diagnosis not present

## 2019-06-23 DIAGNOSIS — IMO0002 Reserved for concepts with insufficient information to code with codable children: Secondary | ICD-10-CM

## 2019-06-23 DIAGNOSIS — E785 Hyperlipidemia, unspecified: Secondary | ICD-10-CM

## 2019-06-23 DIAGNOSIS — E890 Postprocedural hypothyroidism: Secondary | ICD-10-CM | POA: Diagnosis not present

## 2019-06-23 LAB — COMPREHENSIVE METABOLIC PANEL
ALT: 8 U/L (ref 0–53)
AST: 11 U/L (ref 0–37)
Albumin: 4.2 g/dL (ref 3.5–5.2)
Alkaline Phosphatase: 117 U/L (ref 39–117)
BUN: 19 mg/dL (ref 6–23)
CO2: 29 mEq/L (ref 19–32)
Calcium: 9 mg/dL (ref 8.4–10.5)
Chloride: 105 mEq/L (ref 96–112)
Creatinine, Ser: 1.83 mg/dL — ABNORMAL HIGH (ref 0.40–1.50)
GFR: 43.3 mL/min — ABNORMAL LOW (ref >60.00)
Glucose, Bld: 115 mg/dL — ABNORMAL HIGH (ref 70–99)
Potassium: 4.1 mEq/L (ref 3.5–5.1)
Sodium: 142 mEq/L (ref 135–145)
Total Bilirubin: 0.3 mg/dL (ref 0.2–1.2)
Total Protein: 7.4 g/dL (ref 6.0–8.3)

## 2019-06-23 LAB — CBC WITH DIFFERENTIAL/PLATELET
Basophils Absolute: 0 10*3/uL (ref 0.0–0.1)
Basophils Relative: 0.4 % (ref 0.0–3.0)
Eosinophils Absolute: 0.2 10*3/uL (ref 0.0–0.7)
Eosinophils Relative: 3.1 % (ref 0.0–5.0)
HCT: 35.4 % — ABNORMAL LOW (ref 39.0–52.0)
Hemoglobin: 11.4 g/dL — ABNORMAL LOW (ref 13.0–17.0)
Lymphocytes Relative: 19.7 % (ref 12.0–46.0)
Lymphs Abs: 1.1 10*3/uL (ref 0.7–4.0)
MCHC: 32.2 g/dL (ref 30.0–36.0)
MCV: 89.7 fl (ref 78.0–100.0)
Monocytes Absolute: 0.5 10*3/uL (ref 0.1–1.0)
Monocytes Relative: 9.5 % (ref 3.0–12.0)
Neutro Abs: 3.8 10*3/uL (ref 1.4–7.7)
Neutrophils Relative %: 67.3 % (ref 43.0–77.0)
Platelets: 196 10*3/uL (ref 150.0–400.0)
RBC: 3.94 Mil/uL — ABNORMAL LOW (ref 4.22–5.81)
RDW: 13.7 % (ref 11.5–15.5)
WBC: 5.6 10*3/uL (ref 4.0–10.5)

## 2019-06-23 LAB — TSH: TSH: 3.93 u[IU]/mL (ref 0.35–4.50)

## 2019-06-23 LAB — LIPID PANEL
Cholesterol: 158 mg/dL (ref 0–200)
HDL: 57.6 mg/dL (ref >39.00)
LDL Cholesterol: 91 mg/dL (ref 0–99)
NonHDL: 100.3
Total CHOL/HDL Ratio: 3
Triglycerides: 45 mg/dL (ref 0.0–149.0)
VLDL: 9 mg/dL (ref 0.0–40.0)

## 2019-06-23 LAB — PSA: PSA: 3.69 ng/mL (ref 0.10–4.00)

## 2019-06-23 LAB — HEMOGLOBIN A1C: Hgb A1c MFr Bld: 7.5 % — ABNORMAL HIGH (ref 4.6–6.5)

## 2019-06-23 MED ORDER — TRULICITY 1.5 MG/0.5ML ~~LOC~~ SOAJ: 1.5000 mg | SUBCUTANEOUS | 2 refills | Status: DC

## 2019-06-23 MED ORDER — GLUCOSE BLOOD VI STRP: ORAL_STRIP | 3 refills | Status: DC

## 2019-06-23 NOTE — Patient Instructions (Signed)
It was great seeing you today and I am glad you are feeling good   Please continue to work on diet and exercise   Follow up in three months   We will follow up with you about your blood work

## 2019-06-23 NOTE — Addendum Note (Signed)
Addended by: Miles Costain T on: 06/23/2019 09:05 AM   Modules accepted: Orders

## 2019-06-23 NOTE — Progress Notes (Signed)
Subjective:    Patient ID: Scott Vasquez, male    DOB: [birth year], 78 y.o.   MRN: [id number]  HPI  Patient presents for yearly preventative medicine examination. He is a pleasant 78 year old male who  has a past medical history of Anemia, Diabetes mellitus, Diverticulosis of colon, Hypertension, Stroke ([place]), and Thyroid nodule.  Moderate Dementia -monitored by neurology.  Felt to be vascular in origin.  Most recent MRI showed progressive atrophy chronic microvascular disease, and chronic microhemorrhage in the thalamus and brainstem.  He was previously on Aricept 10 mg and most recently started on Namenda. He reports that since starting Namenda last month " I can feel it, I feel like it has helped my memory in some aspects".   Hyperlipidemia -currently prescribed Lipitor 40 mg daily Lab Results  Component Value Date   CHOL 119 12/24/2017   HDL 46.50 12/24/2017   LDLCALC 59 12/24/2017   TRIG 64.0 12/24/2017   CHOLHDL 3 12/24/2017    Hypothyroidism -takes Synthroid 50 mcg daily  DM -currently prescribed Actos 15 mg daily,Trulicity 1.5 mg weekly, and Amaryl 1 mg daily. He reports that he was out medication for a few weeks while his medications got switched to mail order. He has been checking his blood sugars and they have been in the 110-150 range. He denies symptoms of hypoglycemia.   Lab Results  Component Value Date   HGBA1C 6.7 (H) 01/12/2019   Hypertension -currently prescribed lisinopril 40 mg daily. He denies dizziness, lightheadedness, chest pain, or shortness of breath  BP Readings from Last 3 Encounters:  06/23/19 140/88  05/26/19 134/62  10/13/18 124/66    BPH/elevated PSA - Takes Flomax 0.4 mg - denies any symptoms. He has had elevated PSA's in the past and has opted for continued surveillance. He has refused to go to Urology   . Lab Results  Component Value Date   PSA 4.29 (H) 10/13/2018   PSA 4.14 (H) 07/14/2018   PSA 4.57 (H) 04/20/2018    H/O CVA -Plavix 75  mg daily  He denies any acute complaints.   All immunizations and health maintenance protocols were reviewed with the patient and needed orders were placed. Due for yearly flu vaccination  Appropriate screening laboratory values were ordered for the patient including screening of hyperlipidemia, renal function and hepatic function. If indicated by BPH, a PSA was ordered.  Medication reconciliation,  past medical history, social history, problem list and allergies were reviewed in detail with the patient  Goals were established with regard to weight loss, exercise, and  diet in compliance with medications  End of life planning was discussed.   Review of Systems  Constitutional: Negative.   HENT: Negative.   Eyes: Negative.   Respiratory: Negative.   Cardiovascular: Negative.   Gastrointestinal: Negative.   Endocrine: Negative.   Genitourinary: Negative.   Musculoskeletal: Negative.   Skin: Negative.   Allergic/Immunologic: Negative.   Neurological: Negative.   Hematological: Negative.   Psychiatric/Behavioral: Negative.   All other systems reviewed and are negative.  Past Medical History:  Diagnosis Date  . Anemia    iron deficiency  . Diabetes mellitus    type II  . Diverticulosis of colon   . Hypertension   . Stroke ([place])    numbness in left hand  . Thyroid nodule     Social History   Socioeconomic History  . Marital status: Married    Spouse name: Not on file  . Number of children:  Not on file  . Years of education: Not on file  . Highest education level: Not on file  Occupational History  . Occupation: retired    Fish farm manager: [employer]  . Financial resource strain: Not on file  . Food insecurity    Worry: Not on file    Inability: Not on file  . Transportation needs    Medical: Not on file    Non-medical: Not on file  Tobacco Use  . Smoking status: Former Smoker    Packs/day: 0.50    Years: 20.00    Pack years: 10.00    Types:  Cigarettes    Start date: 75    Quit date: 08/10/1958    Years since quitting: 60.9  . Smokeless tobacco: Never Used  Substance and Sexual Activity  . Alcohol use: No  . Drug use: No  . Sexual activity: Not on file  Lifestyle  . Physical activity    Days per week: Not on file    Minutes per session: Not on file  . Stress: Not on file  Relationships  . Social Herbalist on phone: Not on file    Gets together: Not on file    Attends religious service: Not on file    Active member of club or organization: Not on file    Attends meetings of clubs or organizations: Not on file    Relationship status: Not on file  . Intimate partner violence    Fear of current or ex partner: Not on file    Emotionally abused: Not on file    Physically abused: Not on file    Forced sexual activity: Not on file  Other Topics Concern  . Not on file  Social History Narrative   Pt is R handed   Lives in single story home with his wife, Scott Vasquez   2 adult children   8th grade education   Retired Theatre stage manager from [employer]   Married 72 years    Past Surgical History:  Procedure Laterality Date  . BACK SURGERY    . CATARACT EXTRACTION, BILATERAL    . SHOULDER SURGERY  08/2007   Supple  . THYROID LOBECTOMY Left 04/29/2015  . THYROID LOBECTOMY Left 04/29/2015   Procedure: THYROID LOBECTOMY;  Surgeon: Armandina Gemma, MD;  Location: [facility] OR;  Service: General;  Laterality: Left;    Family History  Problem Relation Age of Onset  . Diabetes Other   . Diabetes Father        Died from diabetic coma in October 19, 1970  . Hypertension Father   . Stroke Mother        Died in 28  . Hypertension Mother     Allergies  Allergen Reactions  . Actos [Pioglitazone]     Diarrhea   . Metformin And Related     Diarrhea      Current Outpatient Medications on File Prior to Visit  Medication Sig Dispense Refill  . atorvastatin (LIPITOR) 40 MG tablet Take 1 tablet (40 mg total) by mouth daily. 90 tablet 0  .  Azelastine-Fluticasone 137-50 MCG/ACT SUSP Place 1 spray into the nose 2 (two) times daily. 23 g 3  . Blood Glucose Monitoring Suppl (ONE TOUCH ULTRA SYSTEM KIT) W/DEVICE KIT Use to check blood sugar twice a day 1 each 0  . citalopram (CELEXA) 10 MG tablet Take 1 tablet (10 mg total) by mouth daily. 90 tablet 3  . clopidogrel (PLAVIX) 75 MG tablet Take  1 tablet (75 mg total) by mouth daily. 90 tablet 0  . cyanocobalamin 100 MCG tablet Take 100 mcg by mouth daily.    Marland Kitchen donepezil (ARICEPT) 10 MG tablet Take 1 tablet (10 mg total) by mouth at bedtime. 90 tablet 0  . Dulaglutide (TRULICITY) 1.5 ZS/0.1UX SOPN Inject 1.5 mg into the skin once a week.    Marland Kitchen glimepiride (AMARYL) 1 MG tablet TAKE ONE TABLET BY MOUTH EVERY DAY WITH BREAKFAST 90 tablet 0  . glucose blood (ONE TOUCH ULTRA TEST) test strip USE TO CHECK BLOOD SUGAR TWICE DAILY 100 each 3  . levocetirizine (XYZAL) 5 MG tablet TAKE ONE TABLET BY MOUTH EVERY EVENING. 30 tablet 5  . levothyroxine (SYNTHROID) 50 MCG tablet Take 1 tablet (50 mcg total) by mouth daily. 90 tablet 0  . lisinopril (ZESTRIL) 40 MG tablet Take 1 tablet (40 mg total) by mouth daily. 90 tablet 0  . memantine (NAMENDA) 10 MG tablet Take 1/2 tablet every night for 1 week, then increase to 1/2 tablet twice a day for a week, then increase to 1 tablet twice a day 60 tablet 5  . ONETOUCH DELICA LANCETS 32T MISC 1 each by Other route See admin instructions. Check blood sugar twice daily  0  . pioglitazone (ACTOS) 15 MG tablet Take 1 tablet (15 mg total) by mouth daily. 90 tablet 0  . tamsulosin (FLOMAX) 0.4 MG CAPS capsule Take 1 capsule (0.4 mg total) by mouth daily. 90 capsule 0   No current facility-administered medications on file prior to visit.     BP 140/88   Temp (!) 97 F (36.1 C) (Temporal)   Ht 5' 9.5" (1.765 m)   Wt 200 lb (90.7 kg)   BMI 29.11 kg/m       Objective:   Physical Exam Vitals signs and nursing note reviewed.  Constitutional:      Appearance:  Normal appearance.  HENT:     Head: Normocephalic and atraumatic.     Right Ear: Tympanic membrane, ear canal and external ear normal. There is no impacted cerumen.     Left Ear: Tympanic membrane, ear canal and external ear normal. There is no impacted cerumen.     Nose: Rhinorrhea (chronic) present. No congestion.     Mouth/Throat:     Mouth: Mucous membranes are moist.     Pharynx: Oropharynx is clear. No oropharyngeal exudate.  Eyes:     Extraocular Movements: Extraocular movements intact.     Pupils: Pupils are equal, round, and reactive to light.  Cardiovascular:     Rate and Rhythm: Normal rate and regular rhythm.     Pulses: Normal pulses.     Heart sounds: Normal heart sounds. No murmur. No friction rub. No gallop.   Pulmonary:     Effort: Pulmonary effort is normal. No respiratory distress.     Breath sounds: Normal breath sounds. No stridor. No wheezing, rhonchi or rales.  Chest:     Chest wall: No tenderness.  Abdominal:     General: Abdomen is flat. Bowel sounds are normal. There is no distension.     Palpations: Abdomen is soft. There is no mass.     Tenderness: There is no abdominal tenderness. There is no right CVA tenderness, left CVA tenderness, guarding or rebound.     Hernia: No hernia is present.  Musculoskeletal: Normal range of motion.        General: No swelling, tenderness, deformity or signs of injury.  Right lower leg: No edema.     Left lower leg: No edema.  Skin:    General: Skin is warm and dry.     Capillary Refill: Capillary refill takes less than 2 seconds.     Coloration: Skin is not jaundiced or pale.     Findings: No bruising, erythema, lesion or rash.  Neurological:     General: No focal deficit present.     Mental Status: He is alert and oriented to person, place, and time.     Cranial Nerves: No cranial nerve deficit.     Sensory: No sensory deficit.     Motor: No weakness.     Coordination: Coordination normal.     Gait: Gait  normal.     Deep Tendon Reflexes: Reflexes normal.  Psychiatric:        Mood and Affect: Mood normal.        Behavior: Behavior normal.        Thought Content: Thought content normal.        Judgment: Judgment normal.       Assessment & Plan:  1. Routine general medical examination at a health care facility  - CBC with Differential/Platelet - CMP - Hemoglobin A1c - Lipid panel - TSH  2. Diabetes mellitus type 2 with complications, uncontrolled ([place]) - Continue to work on diet and exercise  - Follow up in three months  - CBC with Differential/Platelet - CMP - Hemoglobin A1c - Lipid panel - TSH  3. Essential hypertension - No change in medications  - CBC with Differential/Platelet - CMP - Hemoglobin A1c - Lipid panel - TSH  4. Elevated PSA - Consider referral to urology  - PSA  5. Acute lacunar stroke ([place]) - Continue with Plavix.  - BP below 130/80 - Heart healthy diet and exercise   6. Hyperlipidemia, unspecified hyperlipidemia type - Consider increase in statin  - CBC with Differential/Platelet - CMP - Hemoglobin A1c - Lipid panel - TSH  7. Moderate dementia with behavioral disturbance ([place]) - Continue with recommendations by Neurology  - CBC with Differential/Platelet - CMP - Hemoglobin A1c - Lipid panel - TSH  8. Postoperative hypothyroidism - Consider increase in synthroid  - CBC with Differential/Platelet - CMP - Hemoglobin A1c - Lipid panel - TSH   [name], NP

## 2019-07-11 ENCOUNTER — Telehealth: Payer: Self-pay

## 2019-07-11 DIAGNOSIS — IMO0002 Reserved for concepts with insufficient information to code with codable children: Secondary | ICD-10-CM

## 2019-07-11 DIAGNOSIS — E1165 Type 2 diabetes mellitus with hyperglycemia: Secondary | ICD-10-CM

## 2019-07-11 MED ORDER — ACCU-CHEK AVIVA PLUS VI STRP: 1.0000 | ORAL_STRIP | Freq: Two times a day (BID) | 12 refills | Status: DC

## 2019-07-11 NOTE — Telephone Encounter (Signed)
Copied from [place] [id number]. Topic: General - Other >> Jul 11, 2019 11:43 AM Jodie Echevaria wrote: Reason for CRM: Patient wife called to inform Dr Tommi Rumps that Rx sent to pharmacy for test strips was wrong, glucose blood (ONE TOUCH ULTRA TEST) test strip patient need Rx sent for accu chek aviva plus test strips. Please send new Rx to the pharmacy.

## 2019-07-12 ENCOUNTER — Other Ambulatory Visit: Payer: Self-pay | Admitting: Family Medicine

## 2019-07-12 MED ORDER — ACCU-CHEK AVIVA DEVI: 0 refills | Status: DC

## 2019-08-02 ENCOUNTER — Other Ambulatory Visit: Payer: Self-pay | Admitting: Adult Health

## 2019-08-02 DIAGNOSIS — R350 Frequency of micturition: Secondary | ICD-10-CM

## 2019-08-02 DIAGNOSIS — I10 Essential (primary) hypertension: Secondary | ICD-10-CM

## 2019-08-02 DIAGNOSIS — E1165 Type 2 diabetes mellitus with hyperglycemia: Secondary | ICD-10-CM

## 2019-08-02 DIAGNOSIS — IMO0002 Reserved for concepts with insufficient information to code with codable children: Secondary | ICD-10-CM

## 2019-08-03 NOTE — Telephone Encounter (Signed)
Sent to the pharmacy by e-scribe. 

## 2019-09-06 ENCOUNTER — Telehealth: Payer: Self-pay | Admitting: Adult Health

## 2019-09-06 ENCOUNTER — Other Ambulatory Visit: Payer: Self-pay

## 2019-09-06 MED ORDER — DONEPEZIL HCL 10 MG PO TABS: 10.0000 mg | ORAL_TABLET | Freq: Every day | ORAL | 0 refills | Status: DC

## 2019-09-06 NOTE — Telephone Encounter (Signed)
Pt is requesting a refill on Donepezil(ARICEPT) 10 MG tablet. Pt uses [facility]. Thanks

## 2019-09-06 NOTE — Telephone Encounter (Signed)
Rx sent to pharmacy. Pt spouse notified of update

## 2019-09-28 ENCOUNTER — Other Ambulatory Visit: Payer: Self-pay | Admitting: Adult Health

## 2019-09-28 DIAGNOSIS — E1165 Type 2 diabetes mellitus with hyperglycemia: Secondary | ICD-10-CM

## 2019-09-28 DIAGNOSIS — IMO0002 Reserved for concepts with insufficient information to code with codable children: Secondary | ICD-10-CM

## 2019-09-29 ENCOUNTER — Ambulatory Visit: Payer: Medicare HMO | Admitting: Adult Health

## 2019-10-31 ENCOUNTER — Other Ambulatory Visit: Payer: Self-pay

## 2019-11-01 ENCOUNTER — Ambulatory Visit (INDEPENDENT_AMBULATORY_CARE_PROVIDER_SITE_OTHER): Payer: Medicare HMO | Admitting: Adult Health

## 2019-11-01 ENCOUNTER — Encounter: Payer: Self-pay | Admitting: Adult Health

## 2019-11-01 VITALS — BP 144/80 | Temp 97.9°F | Wt 209.0 lb

## 2019-11-01 DIAGNOSIS — E118 Type 2 diabetes mellitus with unspecified complications: Secondary | ICD-10-CM | POA: Diagnosis not present

## 2019-11-01 DIAGNOSIS — E1165 Type 2 diabetes mellitus with hyperglycemia: Secondary | ICD-10-CM | POA: Diagnosis not present

## 2019-11-01 DIAGNOSIS — N289 Disorder of kidney and ureter, unspecified: Secondary | ICD-10-CM

## 2019-11-01 DIAGNOSIS — IMO0002 Reserved for concepts with insufficient information to code with codable children: Secondary | ICD-10-CM

## 2019-11-01 LAB — BASIC METABOLIC PANEL
BUN: 19 mg/dL (ref 6–23)
CO2: 27 mEq/L (ref 19–32)
Calcium: 9.3 mg/dL (ref 8.4–10.5)
Chloride: 101 mEq/L (ref 96–112)
Creatinine, Ser: 1.5 mg/dL (ref 0.40–1.50)
GFR: 54.42 mL/min — ABNORMAL LOW (ref >60.00)
Glucose, Bld: 129 mg/dL — ABNORMAL HIGH (ref 70–99)
Potassium: 4 mEq/L (ref 3.5–5.1)
Sodium: 136 mEq/L (ref 135–145)

## 2019-11-01 LAB — HEMOGLOBIN A1C: Hgb A1c MFr Bld: 7.5 % — ABNORMAL HIGH (ref 4.6–6.5)

## 2019-11-01 MED ORDER — TRULICITY 1.5 MG/0.5ML ~~LOC~~ SOAJ: SUBCUTANEOUS | 2 refills | Status: DC

## 2019-11-01 MED ORDER — GLIMEPIRIDE 1 MG PO TABS: ORAL_TABLET | ORAL | 0 refills | Status: DC

## 2019-11-01 MED ORDER — PIOGLITAZONE HCL 15 MG PO TABS: 15.0000 mg | ORAL_TABLET | Freq: Every day | ORAL | 0 refills | Status: DC

## 2019-11-01 NOTE — Patient Instructions (Signed)
It was great seeing you today   I am glad you are feeling well.   I will follow up with you once we get your blood work back   Please follow up in three months

## 2019-11-01 NOTE — Progress Notes (Signed)
Subjective:    Patient ID: Scott Vasquez, male    DOB: [birth year], 79 y.o.   MRN: [id number]  HPI 79 year old male who  has a past medical history of Anemia, Diabetes mellitus, Diverticulosis of colon, Hypertension, Stroke ([place]), and Thyroid nodule.  He presents to the office today for 57-month follow-up regarding diabetes mellitus.  He is currently prescribed Trulicity 1.5 mg, Actos 15 mg, and Amaryl 1 mg.  He does report checking his blood sugars at home and they have been in the 110-150 range.  He denies issues with hypoglycemia.  His last A1c was 7.5 in November 2020, this was up from 6.7 in June 2020.  During his physical in November it was noticed that his kidney function was slightly decreased on his routine labs.  He thought this could be due to a degree of dehydration.  We will recheck this today  Review of Systems See HPI   Past Medical History:  Diagnosis Date  . Anemia    iron deficiency  . Diabetes mellitus    type II  . Diverticulosis of colon   . Hypertension   . Stroke ([place])    numbness in left hand  . Thyroid nodule     Social History   Socioeconomic History  . Marital status: Married    Spouse name: Not on file  . Number of children: Not on file  . Years of education: Not on file  . Highest education level: Not on file  Occupational History  . Occupation: retired    Fish farm manager: [employer]  . Smoking status: Former Smoker    Packs/day: 0.50    Years: 20.00    Pack years: 10.00    Types: Cigarettes    Start date: 63    Quit date: 08/10/1958    Years since quitting: 61.2  . Smokeless tobacco: Never Used  Substance and Sexual Activity  . Alcohol use: No  . Drug use: No  . Sexual activity: Not on file  Other Topics Concern  . Not on file  Social History Narrative   Pt is R handed   Lives in single story home with his wife, [name]   2 adult children   8th grade education   Retired Theatre stage manager from [employer]   Married 66 years   Social  Determinants of Radio broadcast assistant Strain:   . Difficulty of Paying Living Expenses:   Food Insecurity:   . Worried About Charity fundraiser in the Last Year:   . Arboriculturist in the Last Year:   Transportation Needs:   . Film/video editor (Medical):   Marland Kitchen Lack of Transportation (Non-Medical):   Physical Activity:   . Days of Exercise per Week:   . Minutes of Exercise per Session:   Stress:   . Feeling of Stress :   Social Connections:   . Frequency of Communication with Friends and Family:   . Frequency of Social Gatherings with Friends and Family:   . Attends Religious Services:   . Active Member of Clubs or Organizations:   . Attends Archivist Meetings:   Marland Kitchen Marital Status:   Intimate Partner Violence:   . Fear of Current or Ex-Partner:   . Emotionally Abused:   Marland Kitchen Physically Abused:   . Sexually Abused:     Past Surgical History:  Procedure Laterality Date  . BACK SURGERY    . CATARACT EXTRACTION, BILATERAL    .  SHOULDER SURGERY  08/2007   Supple  . THYROID LOBECTOMY Left 04/29/2015  . THYROID LOBECTOMY Left 04/29/2015   Procedure: THYROID LOBECTOMY;  Surgeon: Armandina Gemma, MD;  Location: [facility] OR;  Service: General;  Laterality: Left;    Family History  Problem Relation Age of Onset  . Diabetes Other   . Diabetes Father        Died from diabetic coma in 1970-11-17  . Hypertension Father   . Stroke Mother        Died in 71  . Hypertension Mother     Allergies  Allergen Reactions  . Actos [Pioglitazone]     Diarrhea   . Metformin And Related     Diarrhea      Current Outpatient Medications on File Prior to Visit  Medication Sig Dispense Refill  . atorvastatin (LIPITOR) 40 MG tablet TAKE 1 TABLET (40 MG TOTAL) BY MOUTH DAILY. 90 tablet 3  . Azelastine-Fluticasone 137-50 MCG/ACT SUSP Place 1 spray into the nose 2 (two) times daily. 23 g 3  . Blood Glucose Monitoring Suppl (ACCU-CHEK AVIVA) device Use to test blood glucose twice daily. 1  each 0  . citalopram (CELEXA) 10 MG tablet Take 1 tablet (10 mg total) by mouth daily. 90 tablet 3  . clopidogrel (PLAVIX) 75 MG tablet TAKE 1 TABLET (75 MG TOTAL) BY MOUTH DAILY. 90 tablet 3  . cyanocobalamin 100 MCG tablet Take 100 mcg by mouth daily.    Marland Kitchen donepezil (ARICEPT) 10 MG tablet Take 1 tablet (10 mg total) by mouth at bedtime. 90 tablet 0  . glucose blood (ACCU-CHEK AVIVA PLUS) test strip 1 each by Other route 2 (two) times daily. Use as instructed 100 each 12  . levocetirizine (XYZAL) 5 MG tablet TAKE ONE TABLET BY MOUTH EVERY EVENING. 30 tablet 5  . levothyroxine (SYNTHROID) 50 MCG tablet TAKE 1 TABLET (50 MCG TOTAL) BY MOUTH DAILY. 90 tablet 3  . lisinopril (ZESTRIL) 40 MG tablet TAKE 1 TABLET (40 MG TOTAL) BY MOUTH DAILY. 90 tablet 3  . memantine (NAMENDA) 10 MG tablet Take 1/2 tablet every night for 1 week, then increase to 1/2 tablet twice a day for a week, then increase to 1 tablet twice a day 60 tablet 5  . ONETOUCH DELICA LANCETS 99991111 MISC 1 each by Other route See admin instructions. Check blood sugar twice daily  0  . tamsulosin (FLOMAX) 0.4 MG CAPS capsule TAKE 1 CAPSULE (0.4 MG TOTAL) BY MOUTH DAILY. 90 capsule 3   No current facility-administered medications on file prior to visit.    BP (!) 144/80   Temp 97.9 F (36.6 C) (Temporal)   Wt 209 lb (94.8 kg)   BMI 30.42 kg/m       Objective:   Physical Exam Vitals and nursing note reviewed.  Constitutional:      Appearance: Normal appearance.  Cardiovascular:     Rate and Rhythm: Normal rate and regular rhythm.     Pulses: Normal pulses.     Heart sounds: Normal heart sounds.  Musculoskeletal:        General: Normal range of motion.  Skin:    General: Skin is warm and dry.  Neurological:     General: No focal deficit present.     Mental Status: He is alert and oriented to person, place, and time.  Psychiatric:        Mood and Affect: Mood normal.        Behavior: Behavior normal.  Thought  Content: Thought content normal.        Judgment: Judgment normal.       Assessment & Plan:  1. Diabetes mellitus type 2 with complications, uncontrolled ([place]) - Consider increase in Trulicity  - Hemoglobin 123456 - Basic metabolic panel - glimepiride (AMARYL) 1 MG tablet; TAKE ONE TABLET BY MOUTH EVERY DAY WITH BREAKFAST  Dispense: 90 tablet; Refill: 0 - pioglitazone (ACTOS) 15 MG tablet; Take 1 tablet (15 mg total) by mouth daily.  Dispense: 90 tablet; Refill: 0 - Dulaglutide (TRULICITY) 1.5 0000000 SOPN; INJECT  1.5MG   INTO  THE  SKIN EVERY WEEK  Dispense: 4 pen; Refill: 2  2. Function kidney decreased  - Basic metabolic panel  [name], NP

## 2019-11-02 ENCOUNTER — Ambulatory Visit: Payer: Medicare HMO | Attending: Internal Medicine

## 2019-11-02 DIAGNOSIS — Z23 Encounter for immunization: Secondary | ICD-10-CM

## 2019-11-02 NOTE — Progress Notes (Signed)
   Covid-19 Vaccination Clinic  Name:  Scott Vasquez    MRN: [id number] DOB: [birth year]  11/02/2019  [name] was observed post Covid-19 immunization for 15 minutes without incident. He was provided with Vaccine Information Sheet and instruction to access the V-Safe system.   [name] was instructed to call 911 with any severe reactions post vaccine: Marland Kitchen Difficulty breathing  . Swelling of face and throat  . A fast heartbeat  . A bad rash all over body  . Dizziness and weakness   Immunizations Administered    Name Date Dose VIS Date Route   Moderna COVID-19 Vaccine 11/02/2019 10:38 AM 0.5 mL 07/11/2019 Intramuscular   Manufacturer: Moderna   Lot: [id number]   [place] [id number]

## 2019-11-08 ENCOUNTER — Other Ambulatory Visit: Payer: Self-pay | Admitting: Adult Health

## 2019-11-08 DIAGNOSIS — E1165 Type 2 diabetes mellitus with hyperglycemia: Secondary | ICD-10-CM

## 2019-11-08 DIAGNOSIS — IMO0002 Reserved for concepts with insufficient information to code with codable children: Secondary | ICD-10-CM

## 2019-11-09 NOTE — Telephone Encounter (Signed)
Sent to the local pharmacy on 11/01/2019.

## 2019-11-17 ENCOUNTER — Telehealth: Payer: Self-pay | Admitting: Adult Health

## 2019-11-17 DIAGNOSIS — IMO0002 Reserved for concepts with insufficient information to code with codable children: Secondary | ICD-10-CM

## 2019-11-17 DIAGNOSIS — E1165 Type 2 diabetes mellitus with hyperglycemia: Secondary | ICD-10-CM

## 2019-11-17 NOTE — Telephone Encounter (Signed)
Medication:Glimepiride, Pioglitazone  Pharmacy: [facility]

## 2019-11-21 MED ORDER — GLIMEPIRIDE 1 MG PO TABS: ORAL_TABLET | ORAL | 0 refills | Status: DC

## 2019-11-21 MED ORDER — PIOGLITAZONE HCL 15 MG PO TABS: 15.0000 mg | ORAL_TABLET | Freq: Every day | ORAL | 0 refills | Status: DC

## 2019-11-21 NOTE — Telephone Encounter (Signed)
Sent to the pharmacy by e-scribe. 

## 2019-11-30 ENCOUNTER — Ambulatory Visit: Payer: Medicare HMO | Attending: Internal Medicine

## 2019-11-30 DIAGNOSIS — Z23 Encounter for immunization: Secondary | ICD-10-CM

## 2019-11-30 NOTE — Progress Notes (Signed)
   Covid-19 Vaccination Clinic  Name:  Scott Vasquez    MRN: [id number] DOB: 08-22-1940  11/30/2019  Scott Vasquez was observed post Covid-19 immunization for 15 minutes without incident. He was provided with Vaccine Information Sheet and instruction to access the V-Safe system.   Scott Vasquez was instructed to call 911 with any severe reactions post vaccine: Marland Kitchen Difficulty breathing  . Swelling of face and throat  . A fast heartbeat  . A bad rash all over body  . Dizziness and weakness   Immunizations Administered    Name Date Dose VIS Date Route   Moderna COVID-19 Vaccine 11/30/2019  9:51 AM 0.5 mL 07/2019 Intramuscular   Manufacturer: Moderna   Lot: [id number]   [place]VO:[id number]

## 2019-12-01 ENCOUNTER — Other Ambulatory Visit: Payer: Self-pay | Admitting: Adult Health

## 2019-12-01 ENCOUNTER — Other Ambulatory Visit: Payer: Self-pay | Admitting: Family Medicine

## 2019-12-01 DIAGNOSIS — E1165 Type 2 diabetes mellitus with hyperglycemia: Secondary | ICD-10-CM

## 2019-12-01 DIAGNOSIS — IMO0002 Reserved for concepts with insufficient information to code with codable children: Secondary | ICD-10-CM

## 2019-12-01 MED ORDER — DONEPEZIL HCL 10 MG PO TABS: 10.0000 mg | ORAL_TABLET | Freq: Every day | ORAL | 1 refills | Status: DC

## 2019-12-01 NOTE — Telephone Encounter (Signed)
Sent to the pharmacy by e-scribe. 

## 2019-12-05 NOTE — Telephone Encounter (Signed)
SENT TO THE PHARMACY BY E-SCRIBE. 

## 2019-12-13 ENCOUNTER — Other Ambulatory Visit: Payer: Self-pay | Admitting: Neurology

## 2019-12-19 ENCOUNTER — Telehealth: Payer: Self-pay | Admitting: Adult Health

## 2019-12-19 NOTE — Telephone Encounter (Signed)
Pt is requesting to speak to you. Pt did not want to give me any information and ask, me to send Misty a message. Thanks

## 2019-12-20 NOTE — Telephone Encounter (Signed)
Spoke to Mrs. Nam.  She informed me that the pt has been taking honey from the grocery story.  They weren't sure why he is taking it and who told him to take it.  Not taking for allergies due to not being local honey and patient does not have a sore throat.  Advised he stop due to his diabetes and not remembering why he is taking it and who told him.  Advised a call back if any questions.  Will forward to [facility] as [place].

## 2019-12-22 ENCOUNTER — Other Ambulatory Visit: Payer: Self-pay

## 2019-12-22 ENCOUNTER — Ambulatory Visit: Payer: Medicare HMO | Admitting: Neurology

## 2019-12-22 ENCOUNTER — Encounter: Payer: Self-pay | Admitting: Neurology

## 2019-12-22 VITALS — BP 161/85 | HR 69 | Ht 69.5 in | Wt 209.0 lb

## 2019-12-22 DIAGNOSIS — F0391 Unspecified dementia with behavioral disturbance: Secondary | ICD-10-CM | POA: Diagnosis not present

## 2019-12-22 DIAGNOSIS — F03B18 Unspecified dementia, moderate, with other behavioral disturbance: Secondary | ICD-10-CM

## 2019-12-22 MED ORDER — MEMANTINE HCL 10 MG PO TABS: ORAL_TABLET | ORAL | 3 refills | Status: DC

## 2019-12-22 MED ORDER — CITALOPRAM HYDROBROMIDE 20 MG PO TABS: 20.0000 mg | ORAL_TABLET | Freq: Every day | ORAL | 3 refills | Status: DC

## 2019-12-22 MED ORDER — DONEPEZIL HCL 10 MG PO TABS: 10.0000 mg | ORAL_TABLET | Freq: Every day | ORAL | 3 refills | Status: DC

## 2019-12-22 NOTE — Progress Notes (Signed)
NEUROLOGY FOLLOW UP OFFICE NOTE  Scott Vasquez FR:[id number] 1940/09/15  HISTORY OF PRESENT ILLNESS: I had the pleasure of seeing Scott Vasquez in follow-up in the neurology clinic on 12/22/2019.  The patient was last seen 7 months ago for mild to moderate dementia. He is again accompanied by his wife who helps supplement the history today. SLUMS score 11/30 in 05/2019. Memantine 10mg  BID was added on to Donepezil 10mg  daily. He feels his memory is fair, his wife feels overall stable. He does not drive much, his wife does most of the driving. He manages his own medications, she checks behind him and reports he is doing good. He also manages finances without issues. His wife continues to report irritability, he is on citalopram 10mg  daily. No paranoia or hallucinations. Sleep is good. He denies any headaches, dizziness, focal numbness/tingling/weakness, no falls.   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 He states his memory is bad, he started noticing changes a couple of years ago. He deneis getting lost driving. He misses bills because "there is no money to pay them." He manages his own medications and denies missing doses, his wife agrees. He was reporting difficulty remembering what he read in the bible or magazine after he finishes reading. He also has difficulty remembering names. His wife states that yesterday the bank called her because he was there and could not remember his social security number. She reports he got lost last month when he made a wrong turn. She reports other bills are paid but sometimes late, he has not paid taxes and car insurance from last year, he told her he will pay when ready. His wife asked to speak privately about her concerns, she is asking for a pill to "calm him down." She  reports he is a "time bomb in the morning, I am his worst enemy." He would sit watching TV all day. Symptoms started last summer, he started sleeping upstairs because he was getting irritated with people. Their daughter lives with them. She denies any paranoia or hallucinations. Sleep is good. He is independent with dressing and bathing.  He denies any headaches, dizziness, diplopia, dysarthria/dysphagia, neck/back pain, focal weakness, bowel/bladder dysfunction, anosmia, or tremors. He has chronic left hand tingling since his stroke. He has had nasal discharge since throat surgery in 2017. He has been on Donepezil since 2017, no side effects. No family history of dementia. No history of significant head injuries or alcohol use.  I personally reviewed MRI brain in 2016 showing an acute punctate nonhemorrhagic infarct within the left paramedian superior midbrain and inferomedial thalamus, multiple remote bilateral lacunar infarcts, advanced chronic microvascular disease, multiple foci of remote hemorrhage within the thalami.    PAST MEDICAL HISTORY: Past Medical History:  Diagnosis Date  . Anemia    iron deficiency  . Diabetes mellitus    type II  . Diverticulosis of colon   . Hypertension   . Stroke ([place])    numbness in left hand  . Thyroid nodule     MEDICATIONS: Current Outpatient Medications on File Prior to Visit  Medication Sig Dispense Refill  . ACCU-CHEK AVIVA PLUS test strip TEST BLOOD SUGAR TWICE DAILY 200 strip 3  . atorvastatin (LIPITOR) 40 MG tablet TAKE 1 TABLET (40 MG TOTAL) BY MOUTH DAILY. 90 tablet 3  . Blood Glucose  Monitoring Suppl (ACCU-CHEK AVIVA) device Use to test blood glucose twice daily. 1 each 0  . citalopram (CELEXA) 10 MG tablet Take 1 tablet (10 mg total) by mouth daily. 90 tablet 3  . clopidogrel (PLAVIX) 75 MG tablet TAKE 1 TABLET (75 MG TOTAL) BY MOUTH DAILY. 90 tablet 3  . cyanocobalamin 100 MCG tablet Take 100 mcg by mouth daily.    Marland Kitchen donepezil  (ARICEPT) 10 MG tablet Take 1 tablet (10 mg total) by mouth at bedtime. 90 tablet 1  . Dulaglutide (TRULICITY) 1.5 0000000 SOPN INJECT  1.5MG   INTO  THE  SKIN EVERY WEEK 4 pen 2  . glimepiride (AMARYL) 1 MG tablet TAKE ONE TABLET BY MOUTH EVERY DAY WITH BREAKFAST 90 tablet 0  . levocetirizine (XYZAL) 5 MG tablet TAKE ONE TABLET BY MOUTH EVERY EVENING. 30 tablet 5  . levothyroxine (SYNTHROID) 50 MCG tablet TAKE 1 TABLET (50 MCG TOTAL) BY MOUTH DAILY. 90 tablet 3  . lisinopril (ZESTRIL) 40 MG tablet TAKE 1 TABLET (40 MG TOTAL) BY MOUTH DAILY. 90 tablet 3  . memantine (NAMENDA) 10 MG tablet 1 TAB TWICE A DAY 60 tablet 0  . ONETOUCH DELICA LANCETS [id number] MISC 1 each by Other route See admin instructions. Check blood sugar twice daily  0  . pioglitazone (ACTOS) 15 MG tablet Take 1 tablet (15 mg total) by mouth daily. 90 tablet 0  . tamsulosin (FLOMAX) 0.4 MG CAPS capsule TAKE 1 CAPSULE (0.4 MG TOTAL) BY MOUTH DAILY. 90 capsule 3   No current facility-administered medications on file prior to visit.    ALLERGIES: Allergies  Allergen Reactions  . Actos [Pioglitazone]     Diarrhea   . Metformin And Related     Diarrhea      FAMILY HISTORY: Family History  Problem Relation Age of Onset  . Diabetes Other   . Diabetes Father        Died from diabetic coma in 11/02/1970  . Hypertension Father   . Stroke Mother        Died in 48  . Hypertension Mother     SOCIAL HISTORY: Social History   Socioeconomic History  . Marital status: Married    Spouse name: Not on file  . Number of children: Not on file  . Years of education: Not on file  . Highest education level: Not on file  Occupational History  . Occupation: retired    Fish farm manager: [employer]  . Smoking status: Former Smoker    Packs/day: 0.50    Years: 20.00    Pack years: 10.00    Types: Cigarettes    Start date: 87    Quit date: 08/10/1958    Years since quitting: 61.4  . Smokeless tobacco: Never Used  Substance and  Sexual Activity  . Alcohol use: No  . Drug use: No  . Sexual activity: Not on file  Other Topics Concern  . Not on file  Social History Narrative   Pt is R handed   Lives in single story home with his wife, [name]   2 adult children   8th grade education   Retired Theatre stage manager from [employer]   Married 57 years   Social Determinants of Radio broadcast assistant Strain:   . Difficulty of Paying Living Expenses:   Food Insecurity:   . Worried About Charity fundraiser in the Last Year:   . Arboriculturist in the Last Year:   Transportation Needs:   .  Lack of Transportation (Medical):   Marland Kitchen Lack of Transportation (Non-Medical):   Physical Activity:   . Days of Exercise per Week:   . Minutes of Exercise per Session:   Stress:   . Feeling of Stress :   Social Connections:   . Frequency of Communication with Friends and Family:   . Frequency of Social Gatherings with Friends and Family:   . Attends Religious Services:   . Active Member of Clubs or Organizations:   . Attends Archivist Meetings:   Marland Kitchen Marital Status:   Intimate Partner Violence:   . Fear of Current or Ex-Partner:   . Emotionally Abused:   Marland Kitchen Physically Abused:   . Sexually Abused:     REVIEW OF SYSTEMS: Constitutional: No fevers, chills, or sweats, no generalized fatigue, change in appetite Eyes: No visual changes, double vision, eye pain Ear, nose and throat: No hearing loss, ear pain, nasal congestion, sore throat Cardiovascular: No chest pain, palpitations Respiratory:  No shortness of breath at rest or with exertion, wheezes GastrointestinaI: No nausea, vomiting, diarrhea, abdominal pain, fecal incontinence Genitourinary:  No dysuria, urinary retention or frequency Musculoskeletal:  No neck pain, back pain Integumentary: No rash, pruritus, skin lesions Neurological: as above Psychiatric: No depression, insomnia, anxiety Endocrine: No palpitations, fatigue, diaphoresis, mood swings, change in  appetite, change in weight, increased thirst Hematologic/Lymphatic:  No anemia, purpura, petechiae. Allergic/Immunologic: no itchy/runny eyes, nasal congestion, recent allergic reactions, rashes  PHYSICAL EXAM: Vitals:   12/22/19 1550  BP: (!) 161/85  Pulse: 69  SpO2: 97%   General: No acute distress Head:  Normocephalic/atraumatic Skin/Extremities: No rash, no edema Neurological Exam: alert and oriented to person, place, date, year is 2001. No aphasia or dysarthria. Fund of knowledge is appropriate.  Recent and remote memory are intact. 2/3 delayed recall.  Attention and concentration are reduced, "DEWORD" spelling WORLD backward.    Able to name objects and repeat phrases. Cranial nerves: Pupils equal, round, reactive to light. Extraocular movements intact with no nystagmus. Visual fields full.No facial asymmetry.  Motor: Bulk and tone normal, muscle strength 5/5 throughout with no pronator drift.  Finger to nose testing intact.  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 SLUMS score 11/30 in 05/2019. MRI brain showed progressive atrophy, chronic microvascular disease, and chronic microhemorrhage in the the thalamus and brainstem (typically hypertensive). Continue Donepezil 10mg  daily and Memantine 10mg  BID. His wife reports increased irritability, increase citalopram to 20mg  daily. He rarely drives, continue to monitor. Follow-up in 6 months, they know to call for any changes.   Thank you for allowing me to participate in his care.  Please do not hesitate to call for any questions or concerns.   Ellouise Newer, M.D.   CC: [name], NP

## 2019-12-22 NOTE — Patient Instructions (Signed)
1. We will increase the citalopram to 20mg  daily. A new prescription for Citalopram 20mg  daily has been sent to [facility]  2. Continue Donepezil 10mg  daily and Memantine 10mg  twice a day  3. Continue to monitor driving  4. Follow-up in 6 months, call for any changes  FALL PRECAUTIONS: Be cautious when walking. Scan the area for obstacles that may increase the risk of trips and falls. When getting up in the mornings, sit up at the edge of the bed for a few minutes before getting out of bed. Consider elevating the bed at the head end to avoid drop of blood pressure when getting up. Walk always in a well-lit room (use night lights in the walls). Avoid area rugs or power cords from appliances in the middle of the walkways. Use a walker or a cane if necessary and consider physical therapy for balance exercise. Get your eyesight checked regularly.  FINANCIAL OVERSIGHT: Supervision, especially oversight when making financial decisions or transactions is also recommended as difficulties arise.  HOME SAFETY: Consider the safety of the kitchen when operating appliances like stoves, microwave oven, and blender. Consider having supervision and share cooking responsibilities until no longer able to participate in those. Accidents with firearms and other hazards in the house should be identified and addressed as well.  DRIVING: Regarding driving, in patients with progressive memory problems, driving will be impaired. We advise to have someone else do the driving if trouble finding directions or if minor accidents are reported. Independent driving assessment is available to determine safety of driving.  ABILITY TO BE LEFT ALONE: If patient is unable to contact 911 operator, consider using LifeLine, or when the need is there, arrange for someone to stay with patients. Smoking is a fire hazard, consider supervision or cessation. Risk of wandering should be assessed by caregiver and if detected at any point, supervision  and safe proof recommendations should be instituted.  MEDICATION SUPERVISION: Inability to self-administer medication needs to be constantly addressed. Implement a mechanism to ensure safe administration of the medications.  RECOMMENDATIONS FOR ALL PATIENTS WITH MEMORY PROBLEMS: 1. Continue to exercise (Recommend 30 minutes of walking everyday, or 3 hours every week) 2. Increase social interactions - continue going to [place] and enjoy social gatherings with friends and family 3. Eat healthy, avoid fried foods and eat more fruits and vegetables 4. Maintain adequate blood pressure, blood sugar, and blood cholesterol level. Reducing the risk of stroke and cardiovascular disease also helps promoting better memory. 5. Avoid stressful situations. Live a simple life and avoid aggravations. Organize your time and prepare for the next day in anticipation. 6. Sleep well, avoid any interruptions of sleep and avoid any distractions in the bedroom that may interfere with adequate sleep quality 7. Avoid sugar, avoid sweets as there is a strong link between excessive sugar intake, diabetes, and cognitive impairment The Mediterranean diethas been shown to help patients reduce the risk of progressive memory disorders and reduces cardiovascular risk. This includes eating fish, eat fruits and green leafy vegetables, nuts like almonds and hazelnuts, walnuts, and also use olive oil. Avoid fast foods and fried foods as much as possible. Avoid sweets and sugar as sugar use has been linked to worsening of memory function.  There is always a concern of gradual progression of memory problems. If this is the case, then we may need to adjust level of care according to patient needs. Support, both to the patient and caregiver, should then be put into place.

## 2020-02-06 ENCOUNTER — Encounter: Payer: Self-pay | Admitting: Adult Health

## 2020-02-06 ENCOUNTER — Telehealth: Payer: Self-pay | Admitting: Adult Health

## 2020-02-06 ENCOUNTER — Ambulatory Visit (INDEPENDENT_AMBULATORY_CARE_PROVIDER_SITE_OTHER): Payer: Medicare HMO | Admitting: Adult Health

## 2020-02-06 ENCOUNTER — Other Ambulatory Visit: Payer: Self-pay

## 2020-02-06 VITALS — BP 120/82 | Temp 97.7°F | Wt 203.0 lb

## 2020-02-06 DIAGNOSIS — E1165 Type 2 diabetes mellitus with hyperglycemia: Secondary | ICD-10-CM

## 2020-02-06 DIAGNOSIS — E118 Type 2 diabetes mellitus with unspecified complications: Secondary | ICD-10-CM

## 2020-02-06 DIAGNOSIS — IMO0002 Reserved for concepts with insufficient information to code with codable children: Secondary | ICD-10-CM

## 2020-02-06 LAB — POCT GLYCOSYLATED HEMOGLOBIN (HGB A1C): HbA1c, POC (controlled diabetic range): 7.4 % — AB (ref 0.0–7.0)

## 2020-02-06 MED ORDER — TRULICITY 1.5 MG/0.5ML ~~LOC~~ SOAJ: SUBCUTANEOUS | 2 refills | Status: DC

## 2020-02-06 NOTE — Progress Notes (Signed)
Subjective:    Patient ID: Scott Vasquez, male    DOB: 1940/11/22, 79 y.o.   MRN: [id number]  HPI 79 year old male who  has a past medical history of Anemia, Diabetes mellitus, Diverticulosis of colon, Hypertension, Stroke ([place]), and Thyroid nodule.   He presents to the office today for 19-month follow-up regarding diabetes mellitus.  He is currently prescribed Trulicity 1.5 mg and Amaryl 1 mg.  He does check his blood sugars at home periodically and reports readings in the 110-150 range.  He denies episodes of hypoglycemia.He tries to eat healthy and reports walking a few times a week.   His last A1c was 7.5 in March 2021 Wt Readings from Last 3 Encounters:  02/06/20 203 lb (92.1 kg)  12/22/19 209 lb (94.8 kg)  11/01/19 209 lb (94.8 kg)   Overall, he reports that he is feeling " great"   Review of Systems See HPI   Past Medical History:  Diagnosis Date  . Anemia    iron deficiency  . Diabetes mellitus    type II  . Diverticulosis of colon   . Hypertension   . Stroke ([place])    numbness in left hand  . Thyroid nodule     Social History   Socioeconomic History  . Marital status: Married    Spouse name: Not on file  . Number of children: Not on file  . Years of education: Not on file  . Highest education level: Not on file  Occupational History  . Occupation: retired    Fish farm manager: [employer]  . Smoking status: Former Smoker    Packs/day: 0.50    Years: 20.00    Pack years: 10.00    Types: Cigarettes    Start date: 4    Quit date: 08/10/1958    Years since quitting: 61.5  . Smokeless tobacco: Never Used  Vaping Use  . Vaping Use: Never used  Substance and Sexual Activity  . Alcohol use: No  . Drug use: No  . Sexual activity: Not on file  Other Topics Concern  . 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 Difficulty of Paying Living Expenses:   Food Insecurity:   . Worried About Charity fundraiser in the Last Year:   . Arboriculturist in the Last Year:   Transportation Needs:   . Film/video editor (Medical):   Marland Kitchen Lack of Transportation (Non-Medical):   Physical Activity:   . Days of Exercise per Week:   . Minutes of Exercise per Session:   Stress:   . Feeling of Stress :   Social Connections:   . Frequency of Communication with Friends and Family:   . Frequency of Social Gatherings with Friends and Family:   . Attends Religious Services:   . Active Member of Clubs or Organizations:   . Attends Archivist Meetings:   Marland Kitchen Marital Status:   Intimate Partner Violence:   . Fear of Current or Ex-Partner:   . Emotionally Abused:   Marland Kitchen Physically Abused:   . Sexually Abused:     Past Surgical History:  Procedure Laterality Date  . BACK SURGERY    .  CATARACT EXTRACTION, BILATERAL    . SHOULDER SURGERY  08/2007   Supple  . THYROID LOBECTOMY Left 04/29/2015  . THYROID LOBECTOMY Left 04/29/2015   Procedure: THYROID LOBECTOMY;  Surgeon: Armandina Gemma, MD;  Location: [facility] OR;  Service: General;  Laterality: Left;    Family History  Problem Relation Age of Onset  . Diabetes Other   . Diabetes Father        Died from diabetic coma in 1970-12-01  . Hypertension Father   . Stroke Mother        Died in 37  . Hypertension Mother     Allergies  Allergen Reactions  . Actos [Pioglitazone]     Diarrhea   . Metformin And Related     Diarrhea      Current Outpatient Medications on File Prior to Visit  Medication Sig Dispense Refill  . ACCU-CHEK AVIVA PLUS test strip TEST BLOOD SUGAR TWICE DAILY 200 strip 3  . atorvastatin (LIPITOR) 40 MG tablet TAKE 1 TABLET (40 MG TOTAL) BY MOUTH DAILY. 90 tablet 3  . Blood Glucose Monitoring Suppl (ACCU-CHEK AVIVA) device Use to test blood glucose twice daily.  1 each 0  . citalopram (CELEXA) 20 MG tablet Take 1 tablet (20 mg total) by mouth daily. 90 tablet 3  . clopidogrel (PLAVIX) 75 MG tablet TAKE 1 TABLET (75 MG TOTAL) BY MOUTH DAILY. 90 tablet 3  . cyanocobalamin 100 MCG tablet Take 100 mcg by mouth daily.    Marland Kitchen donepezil (ARICEPT) 10 MG tablet Take 1 tablet (10 mg total) by mouth at bedtime. 90 tablet 3  . Dulaglutide (TRULICITY) 1.5 YY/5.0PT SOPN INJECT  1.5MG   INTO  THE  SKIN EVERY WEEK 4 pen 2  . glimepiride (AMARYL) 1 MG tablet TAKE ONE TABLET BY MOUTH EVERY DAY WITH BREAKFAST 90 tablet 0  . levocetirizine (XYZAL) 5 MG tablet TAKE ONE TABLET BY MOUTH EVERY EVENING. 30 tablet 5  . levothyroxine (SYNTHROID) 50 MCG tablet TAKE 1 TABLET (50 MCG TOTAL) BY MOUTH DAILY. 90 tablet 3  . lisinopril (ZESTRIL) 40 MG tablet TAKE 1 TABLET (40 MG TOTAL) BY MOUTH DAILY. 90 tablet 3  . memantine (NAMENDA) 10 MG tablet 1 TAB TWICE A DAY 180 tablet 3  . ONETOUCH DELICA LANCETS 46F MISC 1 each by Other route See admin instructions. Check blood sugar twice daily  0  . tamsulosin (FLOMAX) 0.4 MG CAPS capsule TAKE 1 CAPSULE (0.4 MG TOTAL) BY MOUTH DAILY. 90 capsule 3   No current facility-administered medications on file prior to visit.    BP 120/82   Temp 97.7 F (36.5 C)   Wt 203 lb (92.1 kg)   BMI 29.55 kg/m       Objective:   Physical Exam Vitals and nursing note reviewed.  Constitutional:      Appearance: Normal appearance.  Cardiovascular:     Rate and Rhythm: Normal rate and regular rhythm.     Pulses: Normal pulses.     Heart sounds: Normal heart sounds.  Pulmonary:     Effort: Pulmonary effort is normal.     Breath sounds: Normal breath sounds.  Neurological:     Mental Status: He is alert.  Psychiatric:        Mood and Affect: Mood normal.        Behavior: Behavior normal.        Thought Content: Thought content normal.        Judgment: Judgment normal.  Assessment & Plan:  1. Diabetes mellitus type 2 with complications,  uncontrolled (HCC) - POCT A1C- 7.4 -has improved slightly and is at goal due to age. No changes in medication given. He was encouraged to work on diet and exercise. Follow-up in 3 months or sooner if needed - Dulaglutide (TRULICITY) 1.5 RJ/7.3GK SOPN; INJECT  1.5MG   INTO  THE  SKIN EVERY WEEK  Dispense: 4 pen; Refill: 2  [name], NP

## 2020-02-06 NOTE — Patient Instructions (Signed)
It was great seeing you today   Your A1c is 7.4 - this is where we want it to be.   I have sent in a prescription for Trulicity   Since is it getting hot outside. Please use the home exercises   Follow up in three months

## 2020-02-06 NOTE — Telephone Encounter (Signed)
Pts spouse is calling in stating that pt is not exercising or eating properly and will not go outside to do any exercise and would like to see if Scott Vasquez can give him some exercises to do in the house.

## 2020-02-19 ENCOUNTER — Other Ambulatory Visit: Payer: Self-pay | Admitting: Adult Health

## 2020-02-19 DIAGNOSIS — IMO0002 Reserved for concepts with insufficient information to code with codable children: Secondary | ICD-10-CM

## 2020-02-20 NOTE — Telephone Encounter (Signed)
Sent to the pharmacy by e-scribe. 

## 2020-02-28 DIAGNOSIS — Z961 Presence of intraocular lens: Secondary | ICD-10-CM | POA: Diagnosis not present

## 2020-02-28 DIAGNOSIS — H40053 Ocular hypertension, bilateral: Secondary | ICD-10-CM | POA: Diagnosis not present

## 2020-02-28 DIAGNOSIS — E119 Type 2 diabetes mellitus without complications: Secondary | ICD-10-CM | POA: Diagnosis not present

## 2020-02-28 LAB — HM DIABETES EYE EXAM

## 2020-03-19 ENCOUNTER — Encounter: Payer: Self-pay | Admitting: Family Medicine

## 2020-03-28 DIAGNOSIS — M79676 Pain in unspecified toe(s): Secondary | ICD-10-CM | POA: Diagnosis not present

## 2020-03-28 DIAGNOSIS — B351 Tinea unguium: Secondary | ICD-10-CM | POA: Diagnosis not present

## 2020-04-05 ENCOUNTER — Telehealth: Payer: Self-pay | Admitting: Adult Health

## 2020-04-05 NOTE — Telephone Encounter (Signed)
Pt need a refill on glimepiride (AMARYL) 1 MG tablet [place], [place] Phone:  [phone]  Fax:  [phone]

## 2020-04-08 ENCOUNTER — Other Ambulatory Visit: Payer: Self-pay | Admitting: *Deleted

## 2020-04-08 DIAGNOSIS — IMO0002 Reserved for concepts with insufficient information to code with codable children: Secondary | ICD-10-CM

## 2020-04-08 MED ORDER — GLIMEPIRIDE 1 MG PO TABS: ORAL_TABLET | ORAL | 0 refills | Status: DC

## 2020-04-08 NOTE — Telephone Encounter (Signed)
Medication refilled and sent to pharmacy requested

## 2020-04-30 ENCOUNTER — Other Ambulatory Visit: Payer: Self-pay | Admitting: Adult Health

## 2020-04-30 DIAGNOSIS — IMO0002 Reserved for concepts with insufficient information to code with codable children: Secondary | ICD-10-CM

## 2020-05-08 ENCOUNTER — Ambulatory Visit (INDEPENDENT_AMBULATORY_CARE_PROVIDER_SITE_OTHER): Payer: Medicare HMO | Admitting: Adult Health

## 2020-05-08 ENCOUNTER — Other Ambulatory Visit: Payer: Self-pay

## 2020-05-08 ENCOUNTER — Encounter: Payer: Self-pay | Admitting: Adult Health

## 2020-05-08 VITALS — BP 140/80 | HR 88 | Temp 98.5°F | Wt 208.2 lb

## 2020-05-08 DIAGNOSIS — E118 Type 2 diabetes mellitus with unspecified complications: Secondary | ICD-10-CM | POA: Diagnosis not present

## 2020-05-08 DIAGNOSIS — E1165 Type 2 diabetes mellitus with hyperglycemia: Secondary | ICD-10-CM | POA: Diagnosis not present

## 2020-05-08 DIAGNOSIS — Z23 Encounter for immunization: Secondary | ICD-10-CM | POA: Diagnosis not present

## 2020-05-08 DIAGNOSIS — IMO0002 Reserved for concepts with insufficient information to code with codable children: Secondary | ICD-10-CM

## 2020-05-08 LAB — POCT GLYCOSYLATED HEMOGLOBIN (HGB A1C): Hemoglobin A1C: 8 % — AB (ref 4.0–5.6)

## 2020-05-08 NOTE — Addendum Note (Signed)
Addended by: Westley Hummer B on: 05/08/2020 04:18 PM   Modules accepted: Orders

## 2020-05-08 NOTE — Progress Notes (Signed)
Subjective:    Patient ID: Scott Vasquez, male    DOB: 05-11-1941, 79 y.o.   MRN: [id number]  HPI 79 year old male who  has a past medical history of Anemia, Diabetes mellitus, Diverticulosis of colon, Hypertension, Stroke ([place]), and Thyroid nodule.   Presents to the office today for 9-month follow-up regarding diabetes mellitus.  He is currently prescribed Trulicity 1.5 mg weekly and Amaryl 1 mg.  He does check his blood sugars at home periodically and reports readings in the 110s to 150 range.  He denies episodes of hypoglycemia.  He tries to eat healthy and has been walking a few times a week.  He reports that he has been out of Trulicity as the pharmacy was not able to get him any. He continues to take Amaryl 1mg   He denies any acute complaints today   Lab Results  Component Value Date   HGBA1C 8.0 (A) 05/08/2020   Wt Readings from Last 3 Encounters:  05/08/20 208 lb 3.2 oz (94.4 kg)  02/06/20 203 lb (92.1 kg)  12/22/19 209 lb (94.8 kg)    Review of Systems See HPI   Past Medical History:  Diagnosis Date   Anemia    iron deficiency   Diabetes mellitus    type II   Diverticulosis of colon    Hypertension    Stroke (HCC)    numbness in left hand   Thyroid nodule     Social History   Socioeconomic History   Marital status: Married    Spouse name: Not on file   Number of children: Not on file   Years of education: Not on file   Highest education level: Not on file  Occupational History   Occupation: retired    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   Smoking status: Former Smoker    Packs/day: 0.50    Years: 20.00    Pack years: 10.00    Types: Cigarettes    Start date: 1953    Quit date: 08/10/1958    Years since quitting: 61.7   Smokeless tobacco: Never Used  Scientific laboratory technician Use: Never used  Substance and Sexual Activity   Alcohol use: No   Drug use: No   Sexual activity: Not on file  Other Topics Concern   Not on file  Social History  Narrative   Pt is R handed   Lives in single story home with his wife, [name]   2 adult children   8th grade education   Retired Theatre stage manager from [employer]   Married 75 years   Social Determinants of Radio broadcast assistant Strain:    Difficulty of Paying Living Expenses: Not on file  Food Insecurity:    Worried About Charity fundraiser in the Last Year: Not on file   [place] in the Last Year: Not on file  Transportation Needs:    Film/video editor (Medical): Not on file   Lack of Transportation (Non-Medical): Not on file  Physical Activity:    Days of Exercise per Week: Not on file   Minutes of Exercise per Session: Not on file  Stress:    Feeling of Stress : Not on file  Social Connections:    Frequency of Communication with Friends and Family: Not on file   Frequency of Social Gatherings with Friends and Family: Not on file   Attends Religious Services: Not on file   Active Member of Clubs  or Organizations: Not on file   Attends Club or Organization Meetings: Not on file   Marital Status: Not on file  Intimate Partner Violence:    Fear of Current or Ex-Partner: Not on file   Emotionally Abused: Not on file   Physically Abused: Not on file   Sexually Abused: Not on file    Past Surgical History:  Procedure Laterality Date   BACK SURGERY     CATARACT EXTRACTION, BILATERAL     SHOULDER SURGERY  08/2007   Supple   THYROID LOBECTOMY Left 04/29/2015   THYROID LOBECTOMY Left 04/29/2015   Procedure: THYROID LOBECTOMY;  Surgeon: Armandina Gemma, MD;  Location: [facility];  Service: General;  Laterality: Left;    Family History  Problem Relation Age of Onset   Diabetes Other    Diabetes Father        Died from diabetic coma in 11/16/70   Hypertension Father    Stroke Mother        Died in 36   Hypertension Mother     Allergies  Allergen Reactions   Actos [Pioglitazone]     Diarrhea    Metformin And Related     Diarrhea       Current Outpatient Medications on File Prior to Visit  Medication Sig Dispense Refill   ACCU-CHEK AVIVA PLUS test strip TEST BLOOD SUGAR TWICE DAILY 200 strip 3   atorvastatin (LIPITOR) 40 MG tablet TAKE 1 TABLET (40 MG TOTAL) BY MOUTH DAILY. 90 tablet 3   Blood Glucose Monitoring Suppl (ACCU-CHEK AVIVA) device Use to test blood glucose twice daily. 1 each 0   citalopram (CELEXA) 20 MG tablet Take 1 tablet (20 mg total) by mouth daily. 90 tablet 3   clopidogrel (PLAVIX) 75 MG tablet TAKE 1 TABLET (75 MG TOTAL) BY MOUTH DAILY. 90 tablet 3   cyanocobalamin 100 MCG tablet Take 100 mcg by mouth daily.     donepezil (ARICEPT) 10 MG tablet Take 1 tablet (10 mg total) by mouth at bedtime. 90 tablet 3   Dulaglutide (TRULICITY) 1.5 ER/1.5QM SOPN INJECT 1.5MG  SUBCUTANEOUSLY EVERY WEEK 4 mL 1   glimepiride (AMARYL) 1 MG tablet TAKE ONE TABLET BY MOUTH EVERY DAY WITH BREAKFAST 90 tablet 0   levocetirizine (XYZAL) 5 MG tablet TAKE ONE TABLET BY MOUTH EVERY EVENING. 30 tablet 5   levothyroxine (SYNTHROID) 50 MCG tablet TAKE 1 TABLET (50 MCG TOTAL) BY MOUTH DAILY. 90 tablet 3   lisinopril (ZESTRIL) 40 MG tablet TAKE 1 TABLET (40 MG TOTAL) BY MOUTH DAILY. 90 tablet 3   memantine (NAMENDA) 10 MG tablet 1 TAB TWICE A DAY 180 tablet 3   ONETOUCH DELICA LANCETS 08Q MISC 1 each by Other route See admin instructions. Check blood sugar twice daily  0   tamsulosin (FLOMAX) 0.4 MG CAPS capsule TAKE 1 CAPSULE (0.4 MG TOTAL) BY MOUTH DAILY. 90 capsule 3   No current facility-administered medications on file prior to visit.    BP 140/80 (BP Location: Left Arm, Patient Position: Sitting, Cuff Size: Large)    Pulse 88    Temp 98.5 F (36.9 C) (Oral)    Wt 208 lb 3.2 oz (94.4 kg)    SpO2 97%    BMI 30.30 kg/m       Objective:   Physical Exam Vitals and nursing note reviewed.  Constitutional:      Appearance: Normal appearance.  Cardiovascular:     Rate and Rhythm: Normal rate and regular  rhythm.  Pulses: Normal pulses.     Heart sounds: Normal heart sounds.  Pulmonary:     Effort: Pulmonary effort is normal.     Breath sounds: Normal breath sounds.  Musculoskeletal:        General: Normal range of motion.  Skin:    General: Skin is warm and dry.  Neurological:     General: No focal deficit present.     Mental Status: He is alert and oriented to person, place, and time.  Psychiatric:        Mood and Affect: Mood normal.        Behavior: Behavior normal.        Thought Content: Thought content normal.        Judgment: Judgment normal.        Assessment & Plan:  1. Diabetes mellitus type 2 with complications, uncontrolled (HCC)  - POCT glycosylated hemoglobin (Hb A1C) - 8.0  - Since he has not been using trulicity. Will keep him on his current doses of medication  - Follow up in three months for physical exam   [name], NP

## 2020-05-08 NOTE — Patient Instructions (Addendum)
Your A1c was 8.0 - this is up a little from 7.4   I am going to keep you on the current dose of medications and have you follow up in December for your physical exam   I hope you have a happy birthday!

## 2020-06-11 DIAGNOSIS — B351 Tinea unguium: Secondary | ICD-10-CM | POA: Diagnosis not present

## 2020-06-11 DIAGNOSIS — E1142 Type 2 diabetes mellitus with diabetic polyneuropathy: Secondary | ICD-10-CM | POA: Diagnosis not present

## 2020-06-11 DIAGNOSIS — L84 Corns and callosities: Secondary | ICD-10-CM | POA: Diagnosis not present

## 2020-06-11 DIAGNOSIS — M79676 Pain in unspecified toe(s): Secondary | ICD-10-CM | POA: Diagnosis not present

## 2020-07-16 ENCOUNTER — Telehealth: Payer: Self-pay | Admitting: Adult Health

## 2020-07-16 NOTE — Telephone Encounter (Signed)
Spoke with the patients wife and she stated she is concerned with the pt sleeping a lot for the past week and decreased appetite.  States her son has tried helping the pt with exercises and will not do these.  Mrs Boy also stated the pts son will bring him to the appt tomorrow.  Message sent to PCP.

## 2020-07-16 NOTE — Telephone Encounter (Signed)
Patient spouse is calling and requesting a call back from the nurse. Wife stated that she needed to speak to the nurse before appointment tomorrow [phone]

## 2020-07-17 ENCOUNTER — Other Ambulatory Visit: Payer: Self-pay

## 2020-07-17 ENCOUNTER — Encounter: Payer: Self-pay | Admitting: Adult Health

## 2020-07-17 ENCOUNTER — Ambulatory Visit (INDEPENDENT_AMBULATORY_CARE_PROVIDER_SITE_OTHER): Payer: Medicare HMO | Admitting: Adult Health

## 2020-07-17 VITALS — BP 122/74 | HR 84 | Temp 98.3°F | Ht 69.0 in | Wt 207.4 lb

## 2020-07-17 DIAGNOSIS — IMO0002 Reserved for concepts with insufficient information to code with codable children: Secondary | ICD-10-CM

## 2020-07-17 DIAGNOSIS — Z0000 Encounter for general adult medical examination without abnormal findings: Secondary | ICD-10-CM

## 2020-07-17 DIAGNOSIS — F0391 Unspecified dementia with behavioral disturbance: Secondary | ICD-10-CM | POA: Diagnosis not present

## 2020-07-17 DIAGNOSIS — E785 Hyperlipidemia, unspecified: Secondary | ICD-10-CM | POA: Diagnosis not present

## 2020-07-17 DIAGNOSIS — E890 Postprocedural hypothyroidism: Secondary | ICD-10-CM

## 2020-07-17 DIAGNOSIS — F03B18 Unspecified dementia, moderate, with other behavioral disturbance: Secondary | ICD-10-CM

## 2020-07-17 DIAGNOSIS — E118 Type 2 diabetes mellitus with unspecified complications: Secondary | ICD-10-CM | POA: Diagnosis not present

## 2020-07-17 DIAGNOSIS — N400 Enlarged prostate without lower urinary tract symptoms: Secondary | ICD-10-CM

## 2020-07-17 DIAGNOSIS — I10 Essential (primary) hypertension: Secondary | ICD-10-CM | POA: Diagnosis not present

## 2020-07-17 DIAGNOSIS — E1165 Type 2 diabetes mellitus with hyperglycemia: Secondary | ICD-10-CM

## 2020-07-17 NOTE — Progress Notes (Signed)
Subjective:    Patient ID: Scott Vasquez, male    DOB: 1941/02/15, 79 y.o.   MRN: [id number]  HPI  Patient presents for yearly preventative medicine examination. He is a pleasant 79 year old male who  has a past medical history of Anemia, Diabetes mellitus, Diverticulosis of colon, Hypertension, Stroke ([place]), and Thyroid nodule.  DM -he is currently prescribed Trulicity 1.5 mg and Amaryl 1 mg. He does check his blood sugars at home periodically reports readings in the 110s to 150 range. He denies any episodes of hypoglycemia. Reports that he checked yesterday morning and his blood sugar was 127.  Lab Results  Component Value Date   HGBA1C 8.0 (A) 05/08/2020   Hyperlipidemia-is currently prescribed Lipitor 40 mg daily. He denies myalgia Lab Results  Component Value Date   CHOL 158 06/23/2019   HDL 57.60 06/23/2019   LDLCALC 91 06/23/2019   TRIG 45.0 06/23/2019   CHOLHDL 3 06/23/2019   BPH -Flomax 0.4 mg daily. He denies symptoms.  History of CVA -takes Plavix 75 mg daily. 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 Is followed by neurology every 6 months, has an upcoming appointment early next week. Currently on Aricept 10 mg and Namenda 10 mg daily. He does feel as though his memory has stayed pretty stable since we last saw each other. His family reports that over the last week or 2 he is sleeping longer throughout the day and does not seem to have as much drive to get up and get out of the house. Patient does endorse this as well. He is on Celexa 20 mg for agitation. He denies feeling depressed  Hypothyroidism - takes Synthroid 50 mcg  Hypertension - takes lisinopril 40 mg daily. He denies dizziness, lightheadedness, chest pain, or shortness of breath  All immunizations and health maintenance protocols were reviewed with the patient and needed orders were placed.  Appropriate screening laboratory values were ordered for the patient including screening of  hyperlipidemia, renal function and hepatic function. If indicated by BPH, a PSA was ordered.  Medication reconciliation,  past medical history, social history, problem list and allergies were reviewed in detail with the patient  Goals were established with regard to weight loss, exercise, and  diet in compliance with medications  End of life planning was discussed.  Wt Readings from Last 3 Encounters:  07/17/20 207 lb 6.4 oz (94.1 kg)  05/08/20 208 lb 3.2 oz (94.4 kg)  02/06/20 203 lb (92.1 kg)    Review of Systems  Constitutional: Negative.   HENT: Negative.   Eyes: Negative.   Respiratory: Negative.   Cardiovascular: Negative.   Gastrointestinal: Negative.   Endocrine: Negative.   Genitourinary: Negative.   Musculoskeletal: Negative.   Skin: Negative.   Allergic/Immunologic: Negative.   Neurological: Negative.   Hematological: Negative.   Psychiatric/Behavioral: Positive for confusion and decreased concentration.  All other systems reviewed and are negative.  Past Medical History:  Diagnosis Date  . Anemia    iron deficiency  . Diabetes mellitus    type II  . Diverticulosis of colon   . Hypertension   . Stroke ([place])    numbness in left hand  . Thyroid nodule     Social History   Socioeconomic History  . Marital status: Married    Spouse name: Not on file  . Number of children: Not on file  . Years of education: Not on file  . Highest education level: Not on file  Occupational History  . 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 Smoking status: Former Smoker    Packs/day: 0.50    Years: 20.00    Pack years: 10.00    Types: Cigarettes    Start date: 73    Quit date: 08/10/1958    Years since quitting: 61.9  . Smokeless tobacco: Never Used  Vaping Use  . Vaping Use: Never used  Substance and Sexual Activity  . Alcohol use: No  . Drug use: No  . Sexual activity: Not on file  Other Topics Concern  . Not on file  Social History  Narrative   Pt is R handed   Lives in single story home with his wife, [name]   2 adult children   8th grade education   Retired Theatre stage manager from [employer]   Married 63 years   Social Determinants of Radio broadcast assistant Strain:   . Difficulty of Paying Living Expenses: Not on file  Food Insecurity:   . Worried About Charity fundraiser in the Last Year: Not on file  . Ran Out of Food in the Last Year: Not on file  Transportation Needs:   . Lack of Transportation (Medical): Not on file  . Lack of Transportation (Non-Medical): Not on file  Physical Activity:   . Days of Exercise per Week: Not on file  . Minutes of Exercise per Session: Not on file  Stress:   . Feeling of Stress : Not on file  Social Connections:   . Frequency of Communication with Friends and Family: Not on file  . Frequency of Social Gatherings with Friends and Family: Not on file  . Attends Religious Services: Not on file  . Active Member of Clubs or Organizations: Not on file  . Attends Archivist Meetings: Not on file  . Marital Status: Not on file  Intimate Partner Violence:   . Fear of Current or Ex-Partner: Not on file  . Emotionally Abused: Not on file  . Physically Abused: Not on file  . Sexually Abused: Not on file    Past Surgical History:  Procedure Laterality Date  . BACK SURGERY    . CATARACT EXTRACTION, BILATERAL    . SHOULDER SURGERY  08/2007   Supple  . THYROID LOBECTOMY Left 04/29/2015  . THYROID LOBECTOMY Left 04/29/2015   Procedure: THYROID LOBECTOMY;  Surgeon: Armandina Gemma, MD;  Location: [facility];  Service: General;  Laterality: Left;    Family History  Problem Relation Age of Onset  . Diabetes Other   . Diabetes Father        Died from diabetic coma in 1970-10-17  . Hypertension Father   . Stroke Mother        Died in 77  . Hypertension Mother     Allergies  Allergen Reactions  . Actos [Pioglitazone]     Diarrhea   . Metformin And Related     Diarrhea       Current Outpatient Medications on File Prior to Visit  Medication Sig Dispense Refill  . ACCU-CHEK AVIVA PLUS test strip TEST BLOOD SUGAR TWICE DAILY 200 strip 3  . atorvastatin (LIPITOR) 40 MG tablet TAKE 1 TABLET (40 MG TOTAL) BY MOUTH DAILY. 90 tablet 3  . citalopram (CELEXA) 20 MG tablet Take 1 tablet (20 mg total) by mouth daily. 90 tablet 3  . cyanocobalamin 100 MCG tablet Take 100 mcg by mouth daily.    Marland Kitchen donepezil (  ARICEPT) 10 MG tablet Take 1 tablet (10 mg total) by mouth at bedtime. 90 tablet 3  . Dulaglutide (TRULICITY) 1.5 AT/5.5DD SOPN INJECT 1.5MG SUBCUTANEOUSLY EVERY WEEK 4 mL 1  . glimepiride (AMARYL) 1 MG tablet TAKE ONE TABLET BY MOUTH EVERY DAY WITH BREAKFAST 90 tablet 0  . levocetirizine (XYZAL) 5 MG tablet TAKE ONE TABLET BY MOUTH EVERY EVENING. 30 tablet 5  . levothyroxine (SYNTHROID) 50 MCG tablet TAKE 1 TABLET (50 MCG TOTAL) BY MOUTH DAILY. 90 tablet 3  . lisinopril (ZESTRIL) 40 MG tablet TAKE 1 TABLET (40 MG TOTAL) BY MOUTH DAILY. 90 tablet 3  . memantine (NAMENDA) 10 MG tablet 1 TAB TWICE A DAY 180 tablet 3  . ONETOUCH DELICA LANCETS 22G MISC 1 each by Other route See admin instructions. Check blood sugar twice daily  0  . tamsulosin (FLOMAX) 0.4 MG CAPS capsule TAKE 1 CAPSULE (0.4 MG TOTAL) BY MOUTH DAILY. 90 capsule 3  . clopidogrel (PLAVIX) 75 MG tablet TAKE 1 TABLET (75 MG TOTAL) BY MOUTH DAILY. 90 tablet 3   No current facility-administered medications on file prior to visit.    BP 122/74   Pulse 84   Temp 98.3 F (36.8 C) (Oral)   Ht 5' 9"  (1.753 m)   Wt 207 lb 6.4 oz (94.1 kg)   SpO2 97%   BMI 30.63 kg/m       Objective:   Physical Exam Vitals and nursing note reviewed.  Constitutional:      General: He is not in acute distress.    Appearance: Normal appearance. He is well-developed and normal weight.  HENT:     Head: Normocephalic and atraumatic.     Right Ear: Tympanic membrane, ear canal and external ear normal. There is no impacted  cerumen.     Left Ear: Tympanic membrane, ear canal and external ear normal. There is no impacted cerumen.     Nose: Nose normal. No congestion or rhinorrhea.     Mouth/Throat:     Mouth: Mucous membranes are moist.     Pharynx: Oropharynx is clear. No oropharyngeal exudate or posterior oropharyngeal erythema.  Eyes:     General:        Right eye: No discharge.        Left eye: No discharge.     Extraocular Movements: Extraocular movements intact.     Conjunctiva/sclera: Conjunctivae normal.     Pupils: Pupils are equal, round, and reactive to light.  Neck:     Vascular: No carotid bruit.     Trachea: No tracheal deviation.  Cardiovascular:     Rate and Rhythm: Normal rate and regular rhythm.     Pulses: Normal pulses.     Heart sounds: Normal heart sounds. No murmur heard.  No friction rub. No gallop.   Pulmonary:     Effort: Pulmonary effort is normal. No respiratory distress.     Breath sounds: Normal breath sounds. No stridor. No wheezing, rhonchi or rales.  Chest:     Chest wall: No tenderness.  Abdominal:     General: Bowel sounds are normal. There is no distension.     Palpations: Abdomen is soft. There is no mass.     Tenderness: There is no abdominal tenderness. There is no right CVA tenderness, left CVA tenderness, guarding or rebound.     Hernia: No hernia is present.  Musculoskeletal:        General: No swelling, tenderness, deformity or signs of injury. Normal range  of motion.     Right lower leg: No edema.     Left lower leg: No edema.  Lymphadenopathy:     Cervical: No cervical adenopathy.  Skin:    General: Skin is warm and dry.     Capillary Refill: Capillary refill takes less than 2 seconds.     Coloration: Skin is not jaundiced or pale.     Findings: No bruising, erythema, lesion or rash.  Neurological:     General: No focal deficit present.     Mental Status: He is alert and oriented to person, place, and time.     Cranial Nerves: No cranial nerve  deficit.     Sensory: No sensory deficit.     Motor: No weakness.     Coordination: Coordination normal.     Gait: Gait normal.     Deep Tendon Reflexes: Reflexes normal.  Psychiatric:        Mood and Affect: Mood normal.        Behavior: Behavior normal.        Thought Content: Thought content normal.        Judgment: Judgment normal.       Assessment & Plan:  1. Routine general medical examination at a health care facility  - CBC with Differential/Platelet; Future - Hemoglobin A1c; Future - Lipid panel; Future - TSH; Future - CMP with eGFR([facility]); Future - Urinalysis; Future - CMP with eGFR([facility]) - TSH - Lipid panel - Hemoglobin A1c - CBC with Differential/Platelet - Urinalysis  2. Diabetes mellitus type 2 with complications, uncontrolled ([place]) - Consider increasing Trulicity  - CBC with Differential/Platelet; Future - Hemoglobin A1c; Future - Lipid panel; Future - TSH; Future - CMP with eGFR([facility]); Future - Urinalysis; Future - CMP with eGFR([facility]) - TSH - Lipid panel - Hemoglobin A1c - CBC with Differential/Platelet - Urinalysis  3. Essential hypertension - Well controlled. Continue Lisinopril 40 mg  - CBC with Differential/Platelet; Future - Hemoglobin A1c; Future - Lipid panel; Future - TSH; Future - CMP with eGFR([facility]); Future - Urinalysis; Future - CMP with eGFR([facility]) - TSH - Lipid panel - Hemoglobin A1c - CBC with Differential/Platelet - Urinalysis  4. Hyperlipidemia, unspecified hyperlipidemia type - Consider increase in statin  - CBC with Differential/Platelet; Future - Hemoglobin A1c; Future - Lipid panel; Future - TSH; Future - CMP with eGFR([facility]); Future - Urinalysis; Future - CMP with eGFR([facility]) - TSH - Lipid panel - Hemoglobin A1c - CBC with Differential/Platelet - Urinalysis  5. Moderate dementia with behavioral disturbance ([place]) - Follow up with Neurology as directed  6. Postoperative hypothyroidism - Consider  increase in synthroid  - CBC with Differential/Platelet; Future - Hemoglobin A1c; Future - Lipid panel; Future - TSH; Future - CMP with eGFR([facility]); Future - Urinalysis; Future - CMP with eGFR([facility]) - TSH - Lipid panel - Hemoglobin A1c - CBC with Differential/Platelet - Urinalysis  7. Benign prostatic hyperplasia without lower urinary tract symptoms - Continue with Flomax  [name], NP

## 2020-07-17 NOTE — Patient Instructions (Addendum)
It was great seeing you today   We will follow up with you regarding your blood work   Please follow up with Dr. Delice Lesch at Neurology for your 6 month follow up on 07/22/2020

## 2020-07-18 LAB — URINALYSIS
Bilirubin Urine: NEGATIVE
Hgb urine dipstick: NEGATIVE
Nitrite: NEGATIVE
Specific Gravity, Urine: 1.029 (ref 1.001–1.03)
pH: 5.5 (ref 5.0–8.0)

## 2020-07-18 LAB — CBC WITH DIFFERENTIAL/PLATELET
Absolute Monocytes: 703 cells/uL (ref 200–950)
Basophils Absolute: 30 cells/uL (ref 0–200)
Basophils Relative: 0.4 %
Eosinophils Absolute: 207 cells/uL (ref 15–500)
Eosinophils Relative: 2.8 %
HCT: 36.4 % — ABNORMAL LOW (ref 38.5–50.0)
Hemoglobin: 11.5 g/dL — ABNORMAL LOW (ref 13.2–17.1)
Lymphs Abs: 1465 cells/uL (ref 850–3900)
MCH: 28.8 pg (ref 27.0–33.0)
MCHC: 31.6 g/dL — ABNORMAL LOW (ref 32.0–36.0)
MCV: 91 fL (ref 80.0–100.0)
MPV: 10.8 fL (ref 7.5–12.5)
Monocytes Relative: 9.5 %
Neutro Abs: 4995 cells/uL (ref 1500–7800)
Neutrophils Relative %: 67.5 %
Platelets: 268 10*3/uL (ref 140–400)
RBC: 4 10*6/uL — ABNORMAL LOW (ref 4.20–5.80)
RDW: 11.8 % (ref 11.0–15.0)
Total Lymphocyte: 19.8 %
WBC: 7.4 10*3/uL (ref 3.8–10.8)

## 2020-07-18 LAB — HEMOGLOBIN A1C
Hgb A1c MFr Bld: 9.9 % of total Hgb — ABNORMAL HIGH (ref <5.7)
Mean Plasma Glucose: 237 mg/dL
eAG (mmol/L): 13.2 mmol/L

## 2020-07-18 LAB — COMPLETE METABOLIC PANEL WITH GFR
AG Ratio: 1.4 (calc) (ref 1.0–2.5)
ALT: 10 U/L (ref 9–46)
AST: 14 U/L (ref 10–35)
Albumin: 4.1 g/dL (ref 3.6–5.1)
Alkaline phosphatase (APISO): 161 U/L — ABNORMAL HIGH (ref 35–144)
BUN/Creatinine Ratio: 10 (calc) (ref 6–22)
BUN: 15 mg/dL (ref 7–25)
CO2: 29 mmol/L (ref 20–32)
Calcium: 9.7 mg/dL (ref 8.6–10.3)
Chloride: 104 mmol/L (ref 98–110)
Creat: 1.52 mg/dL — ABNORMAL HIGH (ref 0.70–1.11)
GFR, Est African American: 49 mL/min/{1.73_m2} — ABNORMAL LOW (ref >=60)
GFR, Est Non African American: 42 mL/min/{1.73_m2} — ABNORMAL LOW (ref >=60)
Globulin: 3 g/dL (calc) (ref 1.9–3.7)
Glucose, Bld: 236 mg/dL — ABNORMAL HIGH (ref 65–99)
Potassium: 4.9 mmol/L (ref 3.5–5.3)
Sodium: 140 mmol/L (ref 135–146)
Total Bilirubin: 0.3 mg/dL (ref 0.2–1.2)
Total Protein: 7.1 g/dL (ref 6.1–8.1)

## 2020-07-18 LAB — LIPID PANEL
Cholesterol: 136 mg/dL (ref <200)
HDL: 47 mg/dL (ref >=40)
LDL Cholesterol (Calc): 74 mg/dL (calc)
Non-HDL Cholesterol (Calc): 89 mg/dL (calc) (ref <130)
Total CHOL/HDL Ratio: 2.9 (calc) (ref <5.0)
Triglycerides: 73 mg/dL (ref <150)

## 2020-07-18 LAB — TSH: TSH: 9.93 mIU/L — ABNORMAL HIGH (ref 0.40–4.50)

## 2020-07-22 ENCOUNTER — Ambulatory Visit: Payer: Medicare HMO | Admitting: Neurology

## 2020-07-23 ENCOUNTER — Other Ambulatory Visit: Payer: Self-pay

## 2020-07-23 ENCOUNTER — Other Ambulatory Visit: Payer: Self-pay | Admitting: Adult Health

## 2020-07-23 DIAGNOSIS — IMO0002 Reserved for concepts with insufficient information to code with codable children: Secondary | ICD-10-CM

## 2020-07-23 DIAGNOSIS — R350 Frequency of micturition: Secondary | ICD-10-CM

## 2020-07-23 DIAGNOSIS — I10 Essential (primary) hypertension: Secondary | ICD-10-CM

## 2020-07-23 MED ORDER — TRULICITY 3 MG/0.5ML ~~LOC~~ SOAJ: 3.0000 mg | SUBCUTANEOUS | 0 refills | Status: DC

## 2020-08-08 ENCOUNTER — Other Ambulatory Visit: Payer: Self-pay | Admitting: Adult Health

## 2020-08-08 DIAGNOSIS — IMO0002 Reserved for concepts with insufficient information to code with codable children: Secondary | ICD-10-CM

## 2020-08-08 DIAGNOSIS — R7989 Other specified abnormal findings of blood chemistry: Secondary | ICD-10-CM

## 2020-08-20 DIAGNOSIS — E1142 Type 2 diabetes mellitus with diabetic polyneuropathy: Secondary | ICD-10-CM | POA: Diagnosis not present

## 2020-08-20 DIAGNOSIS — L84 Corns and callosities: Secondary | ICD-10-CM | POA: Diagnosis not present

## 2020-08-20 DIAGNOSIS — B351 Tinea unguium: Secondary | ICD-10-CM | POA: Diagnosis not present

## 2020-08-20 DIAGNOSIS — M79676 Pain in unspecified toe(s): Secondary | ICD-10-CM | POA: Diagnosis not present

## 2020-08-26 ENCOUNTER — Other Ambulatory Visit: Payer: Medicare HMO

## 2020-08-27 ENCOUNTER — Other Ambulatory Visit: Payer: Medicare HMO

## 2020-09-09 ENCOUNTER — Other Ambulatory Visit: Payer: Self-pay

## 2020-09-09 ENCOUNTER — Other Ambulatory Visit (INDEPENDENT_AMBULATORY_CARE_PROVIDER_SITE_OTHER): Payer: Medicare HMO

## 2020-09-09 ENCOUNTER — Other Ambulatory Visit: Payer: Medicare HMO

## 2020-09-09 ENCOUNTER — Telehealth: Payer: Self-pay | Admitting: Adult Health

## 2020-09-09 DIAGNOSIS — E118 Type 2 diabetes mellitus with unspecified complications: Secondary | ICD-10-CM | POA: Diagnosis not present

## 2020-09-09 DIAGNOSIS — E1165 Type 2 diabetes mellitus with hyperglycemia: Secondary | ICD-10-CM

## 2020-09-09 DIAGNOSIS — R7989 Other specified abnormal findings of blood chemistry: Secondary | ICD-10-CM | POA: Diagnosis not present

## 2020-09-09 DIAGNOSIS — IMO0002 Reserved for concepts with insufficient information to code with codable children: Secondary | ICD-10-CM

## 2020-09-09 LAB — HEMOGLOBIN A1C: Hgb A1c MFr Bld: 10.2 % — ABNORMAL HIGH (ref 4.6–6.5)

## 2020-09-09 LAB — TSH: TSH: 5.37 u[IU]/mL — ABNORMAL HIGH (ref 0.35–4.50)

## 2020-09-09 NOTE — Telephone Encounter (Signed)
FMLA to be filled out--placed in doctor's folder.  Please call [phone] upon completion.

## 2020-09-10 ENCOUNTER — Other Ambulatory Visit: Payer: Self-pay | Admitting: Adult Health

## 2020-09-10 DIAGNOSIS — E1165 Type 2 diabetes mellitus with hyperglycemia: Secondary | ICD-10-CM

## 2020-09-10 DIAGNOSIS — IMO0002 Reserved for concepts with insufficient information to code with codable children: Secondary | ICD-10-CM

## 2020-09-10 MED ORDER — GLIMEPIRIDE 4 MG PO TABS: 4.0000 mg | ORAL_TABLET | Freq: Every day | ORAL | 0 refills | Status: DC

## 2020-09-11 NOTE — Telephone Encounter (Signed)
Form received and placed in Cory's folder. 

## 2020-09-11 NOTE — Telephone Encounter (Signed)
[name] (wife) notified that Scott Vasquez's paper work is available for pick up at the front desk.  Nothing further needed.

## 2020-10-22 DIAGNOSIS — E1142 Type 2 diabetes mellitus with diabetic polyneuropathy: Secondary | ICD-10-CM | POA: Diagnosis not present

## 2020-10-22 DIAGNOSIS — B351 Tinea unguium: Secondary | ICD-10-CM | POA: Diagnosis not present

## 2020-10-22 DIAGNOSIS — L84 Corns and callosities: Secondary | ICD-10-CM | POA: Diagnosis not present

## 2020-10-22 DIAGNOSIS — M79676 Pain in unspecified toe(s): Secondary | ICD-10-CM | POA: Diagnosis not present

## 2020-11-01 ENCOUNTER — Other Ambulatory Visit: Payer: Self-pay | Admitting: Adult Health

## 2020-11-01 DIAGNOSIS — IMO0002 Reserved for concepts with insufficient information to code with codable children: Secondary | ICD-10-CM

## 2020-11-01 DIAGNOSIS — E1165 Type 2 diabetes mellitus with hyperglycemia: Secondary | ICD-10-CM

## 2020-11-01 NOTE — Telephone Encounter (Signed)
Please advise.  Patients chart states he is allergic to pioglitazone. Ok to fill?

## 2020-11-04 ENCOUNTER — Telehealth: Payer: Self-pay | Admitting: Adult Health

## 2020-11-04 NOTE — Telephone Encounter (Signed)
Pt call and stated [facility] want a call at [phone] about  TRULICITY 3 CL/2.7NT SOPN

## 2020-11-05 NOTE — Telephone Encounter (Signed)
Spoke with the pt and informed him our office does not call [employer] in general, the Rx was sent in on 3/28 and asked if [employer] told him what specifically was needed.  Patient stated he was not given a reason and is aware the Rx was sent on 3/28.

## 2020-11-07 ENCOUNTER — Encounter: Payer: Self-pay | Admitting: Adult Health

## 2020-11-07 ENCOUNTER — Other Ambulatory Visit: Payer: Self-pay

## 2020-11-07 ENCOUNTER — Ambulatory Visit: Payer: Medicare HMO | Admitting: Adult Health

## 2020-11-07 ENCOUNTER — Ambulatory Visit (INDEPENDENT_AMBULATORY_CARE_PROVIDER_SITE_OTHER): Payer: Medicare HMO | Admitting: Adult Health

## 2020-11-07 VITALS — BP 132/78 | HR 97 | Temp 98.2°F | Ht 69.0 in | Wt 205.4 lb

## 2020-11-07 DIAGNOSIS — IMO0002 Reserved for concepts with insufficient information to code with codable children: Secondary | ICD-10-CM

## 2020-11-07 DIAGNOSIS — E1165 Type 2 diabetes mellitus with hyperglycemia: Secondary | ICD-10-CM | POA: Diagnosis not present

## 2020-11-07 DIAGNOSIS — I10 Essential (primary) hypertension: Secondary | ICD-10-CM

## 2020-11-07 DIAGNOSIS — E118 Type 2 diabetes mellitus with unspecified complications: Secondary | ICD-10-CM

## 2020-11-07 LAB — POCT GLYCOSYLATED HEMOGLOBIN (HGB A1C): Hemoglobin A1C: 10.4 % — AB (ref 4.0–5.6)

## 2020-11-07 MED ORDER — PIOGLITAZONE HCL 30 MG PO TABS: 30.0000 mg | ORAL_TABLET | Freq: Every day | ORAL | 0 refills | Status: DC

## 2020-11-07 MED ORDER — PIOGLITAZONE HCL 30 MG PO TABS: 15.0000 mg | ORAL_TABLET | Freq: Every day | ORAL | 0 refills | Status: DC

## 2020-11-07 NOTE — Patient Instructions (Signed)
Your A1c went up to 10.4 - you are eating too much sugar - please cut back   I am going to increase your Actos from 15 mg to 30 mg. Please take two of what you have currently and I will send in a new prescription   Please follow up in 3 months

## 2020-11-07 NOTE — Progress Notes (Signed)
Subjective:    Patient ID: Scott Vasquez, male    DOB: March 02, 1941, 80 y.o.   MRN: [id number]  HPI 80 year old male who  has a past medical history of Anemia, Diabetes mellitus, Diverticulosis of colon, Hypertension, Stroke ([place]), and Thyroid nodule.  He presents to the office today for three month follow up regarding DM and HTN   DM -currently prescribed Trulicity 3 mg weekly, Amaryl 5 mg and Actos 15 mg.   He does check his blood sugars at home periodically and reports readings in the 110s to 170 range when fasting.   He has not experienced any episodes of hypoglycemia.  Unfortunately his A1c had increased from 8.0 in September 2000 21-10.2 in January 2022. At this time Trulicity was increased to 3 mg weekly.   Lab Results  Component Value Date   HGBA1C 10.2 (H) 09/09/2020   Hypertension - takes lisinopril 40 mg daily.  He denies dizziness, lightheadedness, chest pain, or shortness of breath BP Readings from Last 3 Encounters:  11/07/20 132/78  07/17/20 122/74  05/08/20 140/80   Review of Systems  All other systems reviewed and are negative.  Past Medical History:  Diagnosis Date  . Anemia    iron deficiency  . Diabetes mellitus    type II  . Diverticulosis of colon   . Hypertension   . Stroke ([place])    numbness in left hand  . Thyroid nodule     Social History   Socioeconomic History  . Marital status: Married    Spouse name: Not on file  . Number of children: Not on file  . Years of education: Not on file  . Highest education level: Not on file  Occupational History  . Occupation: retired    Fish farm manager: [employer] Use  . Smoking status: Former Smoker    Packs/day: 0.50    Years: 20.00    Pack years: 10.00    Types: Cigarettes    Start date: 87    Quit date: 08/10/1958    Years since quitting: 62.2  . Smokeless tobacco: Never Used  Vaping Use  . Vaping Use: Never used  Substance and Sexual Activity  . Alcohol use: No  . Drug use: No  . Sexual  activity: Not on file  Other Topics Concern  . Not on file  Social History Narrative   Pt is R handed   Lives in single story home with his wife, [name]   2 adult children   8th grade education   Retired Theatre stage manager from [employer]   Married 33 years   Social Determinants of Radio broadcast assistant Strain: Not on [employer] Insecurity: Not on file  Transportation Needs: Not on file  Physical Activity: Not on file  Stress: Not on file  Social Connections: Not on file  Intimate Partner Violence: Not on file    Past Surgical History:  Procedure Laterality Date  . BACK SURGERY    . CATARACT EXTRACTION, BILATERAL    . SHOULDER SURGERY  08/2007   Supple  . THYROID LOBECTOMY Left 04/29/2015  . THYROID LOBECTOMY Left 04/29/2015   Procedure: THYROID LOBECTOMY;  Surgeon: Armandina Gemma, MD;  Location: [facility];  Service: General;  Laterality: Left;    Family History  Problem Relation Age of Onset  . Diabetes Other   . Diabetes Father        Died from diabetic coma in November 04, 1970  . Hypertension Father   .  Stroke Mother        Died in 78  . Hypertension Mother     Allergies  Allergen Reactions  . Actos [Pioglitazone]     Diarrhea   . Metformin And Related     Diarrhea      Current Outpatient Medications on File Prior to Visit  Medication Sig Dispense Refill  . ACCU-CHEK AVIVA PLUS test strip TEST BLOOD SUGAR TWICE DAILY 200 strip 3  . atorvastatin (LIPITOR) 40 MG tablet TAKE 1 TABLET EVERY DAY 90 tablet 3  . Blood Glucose Monitoring Suppl (ACCU-CHEK AVIVA PLUS) w/Device KIT USE AS DIRECTED 1 kit 0  . citalopram (CELEXA) 20 MG tablet Take 1 tablet (20 mg total) by mouth daily. 90 tablet 3  . clopidogrel (PLAVIX) 75 MG tablet TAKE 1 TABLET EVERY DAY 90 tablet 3  . cyanocobalamin 100 MCG tablet Take 100 mcg by mouth daily.    Marland Kitchen donepezil (ARICEPT) 10 MG tablet Take 1 tablet (10 mg total) by mouth at bedtime. 90 tablet 3  . glimepiride (AMARYL) 1 MG tablet TAKE ONE TABLET BY  MOUTH EVERY DAY WITH BREAKFAST 90 tablet 0  . glimepiride (AMARYL) 4 MG tablet Take 1 tablet (4 mg total) by mouth daily with breakfast. 90 tablet 0  . levocetirizine (XYZAL) 5 MG tablet TAKE ONE TABLET BY MOUTH EVERY EVENING. 30 tablet 5  . levothyroxine (SYNTHROID) 50 MCG tablet TAKE 1 TABLET EVERY DAY 90 tablet 3  . lisinopril (ZESTRIL) 40 MG tablet TAKE 1 TABLET EVERY DAY 90 tablet 3  . memantine (NAMENDA) 10 MG tablet 1 TAB TWICE A DAY 180 tablet 3  . ONETOUCH DELICA LANCETS 48A MISC 1 each by Other route See admin instructions. Check blood sugar twice daily  0  . pioglitazone (ACTOS) 15 MG tablet TAKE 1 TABLET EVERY DAY 90 tablet 0  . tamsulosin (FLOMAX) 0.4 MG CAPS capsule TAKE 1 CAPSULE EVERY DAY 90 capsule 3  . TRULICITY 3 XK/5.5VZ SOPN INJECT 3MG (1 PEN) SUBCUTANEOUSLY EVERY WEEK 12 mL 0   No current facility-administered medications on file prior to visit.    BP 132/78 (BP Location: Left Arm, Patient Position: Sitting, Cuff Size: Large)   Pulse 97   Temp 98.2 F (36.8 C) (Oral)   Ht 5' 9"  (1.753 m)   Wt 205 lb 6.4 oz (93.2 kg)   SpO2 97%   BMI 30.33 kg/m       Objective:   Physical Exam Vitals and nursing note reviewed.  Constitutional:      Appearance: Normal appearance.  Cardiovascular:     Rate and Rhythm: Normal rate and regular rhythm.     Pulses: Normal pulses.     Heart sounds: Normal heart sounds.  Pulmonary:     Effort: Pulmonary effort is normal.     Breath sounds: Normal breath sounds.  Musculoskeletal:        General: Normal range of motion.  Skin:    General: Skin is warm and dry.     Capillary Refill: Capillary refill takes less than 2 seconds.  Neurological:     General: No focal deficit present.     Mental Status: He is alert and oriented to person, place, and time.  Psychiatric:        Mood and Affect: Mood normal.        Behavior: Behavior normal.        Thought Content: Thought content normal.        Judgment: Judgment normal.  Assessment & Plan:  1. Diabetes mellitus type 2 with complications, uncontrolled (HCC)  - POC HgB A1c- 10.4 - has increased.  - Will increase Actos and keep everything the same  - Needs to cut back on sugars  - pioglitazone (ACTOS) 30 MG tablet; Take 0.5 tablets (15 mg total) by mouth daily.  Dispense: 90 tablet; Refill: 0  2. Essential hypertension - Well controlled.  - No change in medications   [name], NP

## 2020-11-14 ENCOUNTER — Telehealth: Payer: Self-pay | Admitting: Adult Health

## 2020-11-14 NOTE — Telephone Encounter (Signed)
The patients wife called in reference to [facility] contacting him to let him know that he needed to contact his PCP to have his PCP to contact [facility]. She said that it was an automated call and don't know what medication it is in reference to.

## 2020-11-15 NOTE — Telephone Encounter (Signed)
Left a message for the patient to return my call.  

## 2020-11-17 NOTE — Progress Notes (Signed)
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  SLUMS score on his prior visit was 11/30.  MRI of the brain showed progressive atrophy, chronic microvascular disease, and chronic microhemorrhage in the thalamus and brainstem (typically hypertensive). He is on donepezil 10 mg daily, memantine 10 mg initially twice daily.  He is also started on citalopram 20 mg daily which helps with his mood.    . Discussed the diagnosis of dementia . Discussed safety both in and out of the home. Discussed the importance of regular daily schedule with inclusion of crossword puzzles to maintain brain function.  . Monitor driving, wife is aware that if these becomes an issue, she will call earlier than his scheduled appointment, for further recommendations.  The patient may need an neurocognitive evaluation at the time . Continue to monitor mood, continue citalopram 20 mg daily, refill was written today.  . Increase physical activity, walking at least 30 minutes at least 3 times a week.  . Naps should be scheduled and should be no longer than 60 minutes and should not occur after 2 PM.  . Mediterranean diet is recommended  . Follow up in 6 months with [facility]  . Continue donepezil 10 mg daily, and memantine 10 mg twice daily   Case discussed with Dr. Delice Lesch who agrees with the plan     Subjective:   Scott Vasquez for follow-up in the neurology clinic on 11/18/2020.  He was last seen on 09/25/2018 for moderate dementia with behavioral disturbance.  He is accompanied by his wife, who helps supplement the history today.  MOCA score on March 2020 was 11/30.  He is on donepezil 10 mg  daily and memantine 10 mg twice daily.  His prior MRI of the brain without contrast was done on March 2020, not showing any acute  changes, compared to an MRI on 2016, which had shown progression of atrophy, chronic microvascular disease and chronic microhemorrhage in the thalamus and brainstem bilaterally.  Since last visit, the wife reports good and bad days, mood has been helped after restarting citalopram. Memory " is just as good" as per patient report.  Patient's wife Scott Vasquez reports same.  Occasionally he has a "of the day, but these are not frequent.  He continues to drive, denies getting lost, but wife reports that at times he drives too fast, despite being attentive.  At this very moment she denies being worried.  He is not very active, only mowing the grass, but does not go out, he sits down most of the day watching TV.  His medications are managed by him.  He sleeps well, and denies any vivid dreams.  His wife reports irritability, especially when he is trying to find something, but recently he began using citalopram, with good results.  He appears to be less irritable, "more pleasant".  The patient dresses on his own, but "does not like to shower "according to his wife.  He pays the bills by himself, without any help.  He denies missing any of them.  He denies any headaches, focal numbness, new tingling, or unilateral weakness.  He denies any headaches, or recent trauma.  He denies any vision changes, or double vision.  His appetite is good, he denies any trouble swallowing, he denies any constipation or diarrhea.  He denies any incontinence.  He likes to cook,  and denies leaving the stove on leaving the stove on.  He denies any hallucinations, or paranoia.  He denies any vivid dreams.    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 He states his memory is bad, he started noticing changes a couple of years ago. He denies getting lost  driving. He misses bills because "there is no money to pay them." He manages his own medications and denies missing doses, his wife agrees. He was reporting difficulty remembering what he read in the bible or magazine after he finishes reading. He also has difficulty remembering names. His wife states that yesterday the bank called her because he was there and could not remember his social security number. She reports he got lost last month when he made a wrong turn. She reports other bills are paid but sometimes late, he has not paid taxes and car insurance from last year, he told her he will pay when ready. His wife asked to speak privately about her concerns, she is asking for a pill to "calm him down." She reports he is a "time bomb in the morning, I am his worst enemy." He would sit watching TV all day. Symptoms started last summer, he started sleeping upstairs because he was getting irritated with people. Their daughter lives with them. She denies any paranoia or hallucinations. Sleep is good. He is independent with dressing and bathing.  He denies any headaches, dizziness, diplopia, dysarthria/dysphagia, neck/back pain, focal weakness, bowel/bladder dysfunction, anosmia, or tremors. He has chronic left hand tingling since his stroke. He has had nasal discharge since throat surgery in 2017. He has been on Donepezil since 2017, no side effects. No family history of dementia. No history of significant head injuries or alcohol use.  I personally reviewed MRI brain in 2016 showing an acute punctate nonhemorrhagic infarct within the left paramedian superior midbrain and inferomedial thalamus, multiple remote bilateral lacunar infarcts, advanced chronic microvascular disease, multiple foci of remote hemorrhage   PREVIOUS MEDICATIONS: None  CURRENT MEDICATIONS:  Outpatient Encounter Medications as of 11/18/2020  Medication Sig  . ACCU-CHEK AVIVA PLUS test strip TEST BLOOD SUGAR TWICE DAILY  .  atorvastatin (LIPITOR) 40 MG tablet TAKE 1 TABLET EVERY DAY  . Blood Glucose Monitoring Suppl (ACCU-CHEK AVIVA PLUS) w/Device KIT USE AS DIRECTED  . clopidogrel (PLAVIX) 75 MG tablet TAKE 1 TABLET EVERY DAY  . cyanocobalamin 100 MCG tablet Take 100 mcg by mouth daily.  Marland Kitchen glimepiride (AMARYL) 1 MG tablet TAKE ONE TABLET BY MOUTH EVERY DAY WITH BREAKFAST  . levocetirizine (XYZAL) 5 MG tablet TAKE ONE TABLET BY MOUTH EVERY EVENING.  Marland Kitchen levothyroxine (SYNTHROID) 50 MCG tablet TAKE 1 TABLET EVERY DAY  . lisinopril (ZESTRIL) 40 MG tablet TAKE 1 TABLET EVERY DAY  . ONETOUCH DELICA LANCETS 02I MISC 1 each by Other route See admin instructions. Check blood sugar twice daily  . pioglitazone (ACTOS) 30 MG tablet Take 1 tablet (30 mg total) by mouth daily.  . tamsulosin (FLOMAX) 0.4 MG CAPS capsule TAKE 1 CAPSULE EVERY DAY  . TRULICITY 3 OX/7.3ZH SOPN INJECT 3MG (1 PEN) SUBCUTANEOUSLY EVERY WEEK  . [DISCONTINUED] citalopram (CELEXA) 20 MG tablet Take 1 tablet (20 mg total) by mouth daily.  . [DISCONTINUED] donepezil (ARICEPT) 10 MG tablet Take 1 tablet (10 mg total) by mouth at bedtime.  . [DISCONTINUED] memantine (  NAMENDA) 10 MG tablet 1 TAB TWICE A DAY  . citalopram (CELEXA) 20 MG tablet Take 1 tablet (20 mg total) by mouth daily.  Marland Kitchen donepezil (ARICEPT) 10 MG tablet Take 1 tablet (10 mg total) by mouth at bedtime.  Marland Kitchen glimepiride (AMARYL) 4 MG tablet Take 1 tablet (4 mg total) by mouth daily with breakfast. (Patient not taking: Reported on 11/18/2020)  . memantine (NAMENDA) 10 MG tablet 1 TAB TWICE A DAY   No facility-administered encounter medications on file as of 11/18/2020.     Objective:     PHYSICAL EXAMINATION:    VITALS:   Vitals:   11/18/20 1301  BP: (!) 146/92  Pulse: (!) 101  SpO2: 95%  Weight: 206 lb 6.4 oz (93.6 kg)  Height: 5' 10"  (1.778 m)    GEN:  The patient appears stated age and is in NAD. HEENT:  Normocephalic, atraumatic.   Neurological examination:  Orientation:  The patient is alert. Oriented to person, place, does not remember the date  cranial nerves: There is good facial symmetry.The speech is fluent and clear.  Hearing is intact to conversational tone. Sensation: Sensation is intact to light touch throughout Motor: Strength is at least antigravity x4.  Movement examination: Tone: There is normal tone in the UE/LE Abnormal movements:  no tremor.  No myoclonus.  No asterixis.   Coordination:  There is no decremation with RAM's. Gait and Station: The patient has no difficulty arising out of a deep-seated chair without the use of the hands. The patient's stride length is good.    CBC    Component Value Date/Time   WBC 7.4 07/17/2020 1025   RBC 4.00 (L) 07/17/2020 1025   HGB 11.5 (L) 07/17/2020 1025   HCT 36.4 (L) 07/17/2020 1025   PLT 268 07/17/2020 1025   MCV 91.0 07/17/2020 1025   MCH 28.8 07/17/2020 1025   MCHC 31.6 (L) 07/17/2020 1025   RDW 11.8 07/17/2020 1025   LYMPHSABS 1,465 07/17/2020 1025   MONOABS 0.5 06/23/2019 0850   EOSABS 207 07/17/2020 1025   BASOSABS 30 07/17/2020 1025     CMP Latest Ref Rng & Units 07/17/2020 11/01/2019 06/23/2019  Glucose 65 - 99 mg/dL 236(H) 129(H) 115(H)  BUN 7 - 25 mg/dL 15 19 19   Creatinine 0.70 - 1.11 mg/dL 1.52(H) 1.50 1.83(H)  Sodium 135 - 146 mmol/L 140 136 142  Potassium 3.5 - 5.3 mmol/L 4.9 4.0 4.1  Chloride 98 - 110 mmol/L 104 101 105  CO2 20 - 32 mmol/L 29 27 29   Calcium 8.6 - 10.3 mg/dL 9.7 9.3 9.0  Total Protein 6.1 - 8.1 g/dL 7.1 - 7.4  Total Bilirubin 0.2 - 1.2 mg/dL 0.3 - 0.3  Alkaline Phos 39 - 117 U/L - - 117  AST 10 - 35 U/L 14 - 11  ALT 9 - 46 U/L 10 - 8       Total time spent on today's visit was 69mnutes, including both face-to-face time and nonface-to-face time.  Time included that spent on review of records (prior notes available to me/labs/imaging if pertinent), discussing treatment and goals, answering patient's questions and coordinating care.  Cc:  NDorothyann Peng NP SSharene Butters PA-C

## 2020-11-18 ENCOUNTER — Ambulatory Visit: Payer: Medicare HMO | Admitting: Physician Assistant

## 2020-11-18 ENCOUNTER — Other Ambulatory Visit: Payer: Self-pay

## 2020-11-18 ENCOUNTER — Encounter: Payer: Self-pay | Admitting: Physician Assistant

## 2020-11-18 VITALS — BP 146/92 | HR 101 | Ht 70.0 in | Wt 206.4 lb

## 2020-11-18 DIAGNOSIS — F0391 Unspecified dementia with behavioral disturbance: Secondary | ICD-10-CM

## 2020-11-18 DIAGNOSIS — F03B18 Unspecified dementia, moderate, with other behavioral disturbance: Secondary | ICD-10-CM

## 2020-11-18 MED ORDER — CITALOPRAM HYDROBROMIDE 20 MG PO TABS: 20.0000 mg | ORAL_TABLET | Freq: Every day | ORAL | 3 refills | Status: DC

## 2020-11-18 MED ORDER — MEMANTINE HCL 10 MG PO TABS: ORAL_TABLET | ORAL | 3 refills | Status: DC

## 2020-11-18 MED ORDER — DONEPEZIL HCL 10 MG PO TABS: 10.0000 mg | ORAL_TABLET | Freq: Every day | ORAL | 3 refills | Status: DC

## 2020-11-18 NOTE — Patient Instructions (Addendum)
It was a pleasure to see you today at our office.   Recommendations:   Meds: refill for Scott Vasquez and Aricept given with 3 refills  Follow up in 6 months Adult daycare information you ask for: [facility] [phone]     RECOMMENDATIONS FOR ALL PATIENTS WITH MEMORY PROBLEMS: 1. Continue to exercise (Recommend 30 minutes of walking everyday, or 3 hours every week) 2. Increase social interactions - continue going to [place] and enjoy social gatherings with friends and family 3. Eat healthy, avoid fried foods and eat more fruits and vegetables 4. Maintain adequate blood pressure, blood sugar, and blood cholesterol level. Reducing the risk of stroke and cardiovascular disease also helps promoting better memory. 5. Avoid stressful situations. Live a simple life and avoid aggravations. Organize your time and prepare for the next day in anticipation. 6. Sleep well, avoid any interruptions of sleep and avoid any distractions in the bedroom that may interfere with adequate sleep quality 7. Avoid sugar, avoid sweets as there is a strong link between excessive sugar intake, diabetes, and cognitive impairment We discussed the Mediterranean diet, which has been shown to help patients reduce the risk of progressive memory disorders and reduces cardiovascular risk. This includes eating fish, eat fruits and green leafy vegetables, nuts like almonds and hazelnuts, walnuts, and also use olive oil. Avoid fast foods and fried foods as much as possible. Avoid sweets and sugar as sugar use has been linked to worsening of memory function.  There is always a concern of gradual progression of memory problems. If this is the case, then we may need to adjust level of care according to patient needs. Support, both to the patient and caregiver, should then be put into place.    FALL PRECAUTIONS: Be cautious when walking. Scan the area for obstacles that may increase the risk of trips and falls.  When getting up in the mornings, sit up at the edge of the bed for a few minutes before getting out of bed. Consider elevating the bed at the head end to avoid drop of blood pressure when getting up. Walk always in a well-lit room (use night lights in the walls). Avoid area rugs or power cords from appliances in the middle of the walkways. Use a walker or a cane if necessary and consider physical therapy for balance exercise. Get your eyesight checked regularly.   HOME SAFETY: Consider the safety of the kitchen when operating appliances like stoves, microwave oven, and blender. Consider having supervision and share cooking responsibilities until no longer able to participate in those. Accidents with firearms and other hazards in the house should be identified and addressed as well.   ABILITY TO BE LEFT ALONE: If patient is unable to contact 911 operator, consider using LifeLine, or when the need is there, arrange for someone to stay with patients. Smoking is a fire hazard, consider supervision or cessation. Risk of wandering should be assessed by caregiver and if detected at any point, supervision and safe proof recommendations should be instituted   DRIVING: Regarding driving, in patients with progressive memory problems, driving will be impaired. We advise to have someone else do the driving if trouble finding directions or if minor accidents are reported. Independent driving assessment is available to determine safety of driving.   If you are interested in the driving assessment, you can contact the following:  [employer] in [place]  [place] [phone]  [place]  [phone] or [phone]    Mediterranean Diet A Mediterranean diet refers to food and lifestyle choices that are based on the traditions of countries located on the [employer]. This way of eating has been shown to help  prevent certain conditions and improve outcomes for people who have chronic diseases, like kidney disease and heart disease. What are tips for following this plan? Lifestyle   Cook and eat meals together with your family, when possible.  Drink enough fluid to keep your urine clear or pale yellow.  Be physically active every day. This includes:  Aerobic exercise like running or swimming.  Leisure activities like gardening, walking, or housework.  Get 7-8 hours of sleep each night.  If recommended by your health care provider, drink red wine in moderation. This means 1 glass a day for nonpregnant women and 2 glasses a day for men. A glass of wine equals 5 oz (150 mL). Reading food labels   Check the serving size of packaged foods. For foods such as rice and pasta, the serving size refers to the amount of cooked product, not dry.  Check the total fat in packaged foods. Avoid foods that have saturated fat or trans fats.  Check the ingredients list for added sugars, such as corn syrup. Shopping   At the grocery store, buy most of your food from the areas near the walls of the store. This includes:  Fresh fruits and vegetables (produce).  Grains, beans, nuts, and seeds. Some of these may be available in unpackaged forms or large amounts (in bulk).  Fresh seafood.  Poultry and eggs.  Low-fat dairy products.  Buy whole ingredients instead of prepackaged foods.  Buy fresh fruits and vegetables in-season from local farmers markets.  Buy frozen fruits and vegetables in resealable bags.  If you do not have access to quality fresh seafood, buy precooked frozen shrimp or canned fish, such as tuna, salmon, or sardines.  Buy small amounts of raw or cooked vegetables, salads, or olives from the deli or salad bar at your store.  Stock your pantry so you always have certain foods on hand, such as olive oil, canned tuna, canned tomatoes, rice, pasta, and beans. Cooking   Cook foods  with extra-virgin olive oil instead of using butter or other vegetable oils.  Have meat as a side dish, and have vegetables or grains as your main dish. This means having meat in small portions or adding small amounts of meat to foods like pasta or stew.  Use beans or vegetables instead of meat in common dishes like chili or lasagna.  Experiment with different cooking methods. Try roasting or broiling vegetables instead of steaming or sauteing them.  Add frozen vegetables to soups, stews, pasta, or rice.  Add nuts or seeds for added healthy fat at each meal. You can add these to yogurt, salads, or vegetable dishes.  Marinate fish or vegetables using olive oil, lemon juice, garlic, and fresh herbs. Meal planning   Plan to eat 1 vegetarian meal one day each week. Try to work up to 2 vegetarian meals, if possible.  Eat seafood 2 or more times a week.  Have healthy snacks readily available, such as:  Vegetable sticks with hummus.  Greek yogurt.  Fruit and nut trail mix.  Eat balanced meals throughout the week. This includes:  Fruit: 2-3 servings a day  Vegetables: 4-5 servings a day  Low-fat dairy: 2 servings a day  Fish, poultry, or lean meat: 1 serving a day  Beans and legumes: 2 or more servings a week  Nuts and seeds: 1-2 servings a day  Whole grains: 6-8 servings a day  Extra-virgin olive oil: 3-4 servings a day  Limit red meat and sweets to only a few servings a month What are my food choices?  Mediterranean diet  Recommended  Grains: Whole-grain pasta. Brown rice. Bulgar wheat. Polenta. Couscous. Whole-wheat bread. [place].  Vegetables: Artichokes. Beets. Broccoli. Cabbage. Carrots. Eggplant. Green beans. Chard. Kale. Spinach. Onions. Leeks. Peas. Squash. Tomatoes. Peppers. Radishes.  Fruits: Apples. Apricots. Avocado. Berries. Bananas. Cherries. Dates. Figs. Grapes. Lemons. Melon. Oranges. Peaches. Plums. Pomegranate.  Meats and other protein  foods: Beans. Almonds. Sunflower seeds. Pine nuts. Peanuts. [place]. Salmon. Scallops. Shrimp. [place]. Tilapia. Clams. Oysters. Eggs.  Dairy: Low-fat milk. Cheese. Greek yogurt.  Beverages: Water. Red wine. Herbal tea.  Fats and oils: Extra virgin olive oil. Avocado oil. Grape seed oil.  Sweets and desserts: [place] yogurt with honey. Baked apples. Poached pears. Trail mix.  Seasoning and other foods: Basil. Cilantro. Coriander. Cumin. Mint. Parsley. Sage. Rosemary. Tarragon. Garlic. Oregano. Thyme. Pepper. Balsalmic vinegar. Tahini. Hummus. Tomato sauce. Olives. Mushrooms.  Limit these  Grains: Prepackaged pasta or rice dishes. Prepackaged cereal with added sugar.  Vegetables: Deep fried potatoes (french fries).  Fruits: Fruit canned in syrup.  Meats and other protein foods: Beef. Pork. Lamb. Poultry with skin. Hot dogs. Bernie[place] Salines.  Dairy: Ice cream. Sour cream. Whole milk.  Beverages: Juice. Sugar-sweetened soft drinks. Beer. Liquor and spirits.  Fats and oils: Butter. Canola oil. Vegetable oil. Beef fat (tallow). Lard.  Sweets and desserts: Cookies. Cakes. Pies. Candy.  Seasoning and other foods: Mayonnaise. Premade sauces and marinades.  The items listed may not be a complete list. Talk with your dietitian about what dietary choices are right for you. Summary  The Mediterranean diet includes both food and lifestyle choices.  Eat a variety of fresh fruits and vegetables, beans, nuts, seeds, and whole grains.  Limit the amount of red meat and sweets that you eat.  Talk with your health care provider about whether it is safe for you to drink red wine in moderation. This means 1 glass a day for nonpregnant women and 2 glasses a day for men. A glass of wine equals 5 oz (150 mL). This information is not intended to replace advice given to you by your health care provider. Make sure you discuss any questions you have with your health care provider. Document Released: 03/19/2016 Document  Revised: 04/21/2016 Document Reviewed: 03/19/2016 Elsevier Interactive Patient Education  2017 [employer].

## 2020-12-17 ENCOUNTER — Telehealth: Payer: Self-pay | Admitting: Adult Health

## 2020-12-17 ENCOUNTER — Ambulatory Visit: Payer: Medicare HMO

## 2020-12-17 ENCOUNTER — Telehealth: Payer: Self-pay

## 2020-12-17 DIAGNOSIS — E1165 Type 2 diabetes mellitus with hyperglycemia: Secondary | ICD-10-CM

## 2020-12-17 DIAGNOSIS — IMO0002 Reserved for concepts with insufficient information to code with codable children: Secondary | ICD-10-CM

## 2020-12-17 MED ORDER — TRULICITY 3 MG/0.5ML ~~LOC~~ SOAJ: SUBCUTANEOUS | 2 refills | Status: DC

## 2020-12-17 NOTE — Telephone Encounter (Signed)
Called pt at 330 for 330 pm MWV scheduled today 12/17/2020  No answer left voice mail message to return call and reschedule [place] Tried calling back 340pm no answer, and again 345 pm no answer.  L.Denym Christenberry,LPN

## 2020-12-17 NOTE — Telephone Encounter (Signed)
TRULICITY 3 VO/5.9YT [place], [place] Phone:  [phone]  Fax:  [phone]

## 2020-12-17 NOTE — Telephone Encounter (Signed)
Rx sent in

## 2020-12-31 DIAGNOSIS — L84 Corns and callosities: Secondary | ICD-10-CM | POA: Diagnosis not present

## 2020-12-31 DIAGNOSIS — E1142 Type 2 diabetes mellitus with diabetic polyneuropathy: Secondary | ICD-10-CM | POA: Diagnosis not present

## 2020-12-31 DIAGNOSIS — M79676 Pain in unspecified toe(s): Secondary | ICD-10-CM | POA: Diagnosis not present

## 2020-12-31 DIAGNOSIS — B351 Tinea unguium: Secondary | ICD-10-CM | POA: Diagnosis not present

## 2021-01-06 ENCOUNTER — Other Ambulatory Visit: Payer: Self-pay | Admitting: Adult Health

## 2021-01-06 DIAGNOSIS — E1165 Type 2 diabetes mellitus with hyperglycemia: Secondary | ICD-10-CM

## 2021-01-06 DIAGNOSIS — IMO0002 Reserved for concepts with insufficient information to code with codable children: Secondary | ICD-10-CM

## 2021-02-06 ENCOUNTER — Other Ambulatory Visit: Payer: Self-pay

## 2021-02-07 ENCOUNTER — Telehealth: Payer: Self-pay | Admitting: Adult Health

## 2021-02-07 ENCOUNTER — Encounter: Payer: Self-pay | Admitting: Adult Health

## 2021-02-07 ENCOUNTER — Ambulatory Visit (INDEPENDENT_AMBULATORY_CARE_PROVIDER_SITE_OTHER): Payer: Medicare HMO | Admitting: Adult Health

## 2021-02-07 VITALS — BP 122/80 | HR 85 | Temp 98.5°F | Ht 70.0 in | Wt 196.0 lb

## 2021-02-07 DIAGNOSIS — E118 Type 2 diabetes mellitus with unspecified complications: Secondary | ICD-10-CM

## 2021-02-07 DIAGNOSIS — I10 Essential (primary) hypertension: Secondary | ICD-10-CM | POA: Diagnosis not present

## 2021-02-07 DIAGNOSIS — E1165 Type 2 diabetes mellitus with hyperglycemia: Secondary | ICD-10-CM

## 2021-02-07 DIAGNOSIS — IMO0002 Reserved for concepts with insufficient information to code with codable children: Secondary | ICD-10-CM

## 2021-02-07 LAB — POCT GLYCOSYLATED HEMOGLOBIN (HGB A1C): Hemoglobin A1C: 8.1 % — AB (ref 4.0–5.6)

## 2021-02-07 MED ORDER — PIOGLITAZONE HCL 30 MG PO TABS
30.0000 mg | ORAL_TABLET | Freq: Every day | ORAL | 0 refills | Status: DC
Start: 2021-02-07 — End: 2021-05-16

## 2021-02-07 MED ORDER — GLIMEPIRIDE 4 MG PO TABS: 4.0000 mg | ORAL_TABLET | Freq: Every day | ORAL | 0 refills | Status: DC

## 2021-02-07 NOTE — Progress Notes (Signed)
Subjective:    Patient ID: Scott Vasquez, male    DOB: [birth year], 80 y.o.   MRN: [id number]  HPI 80 year old male who  has a past medical history of Anemia, Diabetes mellitus, Diverticulosis of colon, Hypertension, Stroke ([place]), and Thyroid nodule.  He presents to the office today for three month follow up regarding DM and HTN   DM -currently prescribed Trulicity 3 mg weekly, Amaryl 5 mg, and Actos 30 mg.  His A1c had increased to 10.4, from 10.23 months ago.  At this time Actos was increased from 15 to 30 mg.  There is concern that he is not taking his medications as directed.  Lab Results  Component Value Date   HGBA1C 10.4 (A) 11/07/2020   HTN -takes lisinopril 40 mg daily.  He denies dizziness, lightheadedness, chest pain, or shortness of breath BP Readings from Last 3 Encounters:  02/07/21 122/80  11/18/20 (!) 146/92  11/07/20 132/78    Review of Systems See HPI   Past Medical History:  Diagnosis Date   Anemia    iron deficiency   Diabetes mellitus    type II   Diverticulosis of colon    Hypertension    Stroke (HCC)    numbness in left hand   Thyroid nodule     Social History   Socioeconomic History   Marital status: Married    Spouse name: Not on file   Number of children: Not on file   Years of education: Not on file   Highest education level: Not on file  Occupational History   Occupation: retired    Fish farm manager: [employer] Use   Smoking status: Former    Packs/day: 0.50    Years: 20.00    Pack years: 10.00    Types: Cigarettes    Start date: 1951-10-26    Quit date: 08/10/1958    Years since quitting: 62.5   Smokeless tobacco: Never  Vaping Use   Vaping Use: Never used  Substance and Sexual Activity   Alcohol use: No   Drug use: No   Sexual activity: Not on file  Other Topics Concern   Not on file  Social History Narrative   Pt is R handed   Lives in single story home with his wife, [name]   2 adult children   8th grade education    Retired Theatre stage manager from [employer]   Married 9 years   Social Determinants of Health   Financial Resource Strain: Not on file  Food Insecurity: Not on file  Transportation Needs: Not on file  Physical Activity: Not on file  Stress: Not on file  Social Connections: Not on file  Intimate Partner Violence: Not on file    Past Surgical History:  Procedure Laterality Date   BACK SURGERY     CATARACT EXTRACTION, BILATERAL     SHOULDER SURGERY  08/2007   Supple   THYROID LOBECTOMY Left 04/29/2015   THYROID LOBECTOMY Left 04/29/2015   Procedure: THYROID LOBECTOMY;  Surgeon: Armandina Gemma, MD;  Location: [place];  Service: General;  Laterality: Left;    Family History  Problem Relation Age of Onset   Diabetes Other    Diabetes Father        Died from diabetic coma in 1970-10-25   Hypertension Father    Stroke Mother        Died in 86   Hypertension Mother     Allergies  Allergen Reactions   Actos [Pioglitazone]  Diarrhea    Metformin And Related     Diarrhea      Current Outpatient Medications on File Prior to Visit  Medication Sig Dispense Refill   ACCU-CHEK AVIVA PLUS test strip TEST BLOOD SUGAR TWICE DAILY 200 strip 3   atorvastatin (LIPITOR) 40 MG tablet TAKE 1 TABLET EVERY DAY 90 tablet 3   Blood Glucose Monitoring Suppl (ACCU-CHEK AVIVA PLUS) w/Device KIT USE AS DIRECTED 1 kit 0   citalopram (CELEXA) 20 MG tablet Take 1 tablet (20 mg total) by mouth daily. 90 tablet 3   clopidogrel (PLAVIX) 75 MG tablet TAKE 1 TABLET EVERY DAY 90 tablet 3   cyanocobalamin 100 MCG tablet Take 100 mcg by mouth daily.     donepezil (ARICEPT) 10 MG tablet Take 1 tablet (10 mg total) by mouth at bedtime. 90 tablet 3   Dulaglutide (TRULICITY) 3 YW/7.3XT SOPN INJECT 3MG (1 PEN) SUBCUTANEOUSLY EVERY WEEK 12 mL 2   glimepiride (AMARYL) 1 MG tablet TAKE ONE TABLET BY MOUTH EVERY DAY WITH BREAKFAST 90 tablet 0   glimepiride (AMARYL) 4 MG tablet Take 1 tablet (4 mg total) by mouth daily with  breakfast. 90 tablet 0   levocetirizine (XYZAL) 5 MG tablet TAKE ONE TABLET BY MOUTH EVERY EVENING. 30 tablet 5   levothyroxine (SYNTHROID) 50 MCG tablet TAKE 1 TABLET EVERY DAY 90 tablet 3   lisinopril (ZESTRIL) 40 MG tablet TAKE 1 TABLET EVERY DAY 90 tablet 3   memantine (NAMENDA) 10 MG tablet 1 TAB TWICE A DAY 180 tablet 3   ONETOUCH DELICA LANCETS 06Y MISC 1 each by Other route See admin instructions. Check blood sugar twice daily  0   pioglitazone (ACTOS) 30 MG tablet Take 1 tablet (30 mg total) by mouth daily. 90 tablet 0   tamsulosin (FLOMAX) 0.4 MG CAPS capsule TAKE 1 CAPSULE EVERY DAY 90 capsule 3   No current facility-administered medications on file prior to visit.    BP 122/80   Pulse 85   Temp 98.5 F (36.9 C) (Oral)   Ht 5' 10"  (1.778 m)   Wt 196 lb (88.9 kg)   BMI 28.12 kg/m       Objective:   Physical Exam Vitals and nursing note reviewed.  Constitutional:      Appearance: Normal appearance.  Cardiovascular:     Rate and Rhythm: Normal rate and regular rhythm.     Pulses: Normal pulses.     Heart sounds: Normal heart sounds.  Skin:    General: Skin is warm and dry.  Neurological:     General: No focal deficit present.     Mental Status: He is alert and oriented to person, place, and time.  Psychiatric:        Mood and Affect: Mood normal.        Behavior: Behavior normal.       Assessment & Plan:  1. Diabetes mellitus type 2 with complications, uncontrolled (HCC)  - POC HgB A1c- 8.7 - has improved  - Continue with current medications - Follow up in three  months or sooner if needed - pioglitazone (ACTOS) 30 MG tablet; Take 1 tablet (30 mg total) by mouth daily.  Dispense: 90 tablet; Refill: 0 - glimepiride (AMARYL) 4 MG tablet; Take 1 tablet (4 mg total) by mouth daily with breakfast.  Dispense: 90 tablet; Refill: 0  2. Essential hypertension - Controlled. No change in medications   [name], NP

## 2021-02-07 NOTE — Patient Instructions (Signed)
Your A1c improved from from 10.4 to 8.7 - great job! I am going to keep you on the same medications   I did not see anything concerning on your legs, they are dry but nothing concerning for skin cancer   I would like to see you back in 3 months

## 2021-02-07 NOTE — Telephone Encounter (Signed)
Noted  

## 2021-02-07 NOTE — Telephone Encounter (Signed)
Pt wife call and stated he have to spots on the back of his leg and want Tommi Rumps to look it them his appt is at 1:00

## 2021-03-11 DIAGNOSIS — M79676 Pain in unspecified toe(s): Secondary | ICD-10-CM | POA: Diagnosis not present

## 2021-03-11 DIAGNOSIS — L84 Corns and callosities: Secondary | ICD-10-CM | POA: Diagnosis not present

## 2021-03-11 DIAGNOSIS — B351 Tinea unguium: Secondary | ICD-10-CM | POA: Diagnosis not present

## 2021-03-11 DIAGNOSIS — E1142 Type 2 diabetes mellitus with diabetic polyneuropathy: Secondary | ICD-10-CM | POA: Diagnosis not present

## 2021-03-25 ENCOUNTER — Other Ambulatory Visit: Payer: Self-pay | Admitting: Adult Health

## 2021-03-31 ENCOUNTER — Ambulatory Visit: Payer: Medicare HMO | Admitting: Neurology

## 2021-04-03 ENCOUNTER — Telehealth: Payer: Self-pay

## 2021-04-03 NOTE — Telephone Encounter (Signed)
Pharmacy called wanting clarification on prescription pharmacist stated the pioglitazone (ACTOS) 30 MG tablet counteracts with clopidogrel (PLAVIX) 75 MG tablet and the recommended dose for pioglitazone (ACTOS) 30 MG tablet would be a 15 MG tablet  call back # [phone]

## 2021-04-03 NOTE — Telephone Encounter (Signed)
Spoke to pharmacist and they stated that the Actos dosage paired with the Plavix is beyond the maximum strength allowed. Pharmacist wants to know if this is okay for you.

## 2021-04-04 NOTE — Telephone Encounter (Signed)
Scott Vasquez from [facility] advised of update.

## 2021-04-25 ENCOUNTER — Telehealth: Payer: Self-pay | Admitting: Adult Health

## 2021-04-25 NOTE — Chronic Care Management (AMB) (Signed)
  Chronic Care Management   Note  04/25/2021 Name: Scott Vasquez MRN: [id number] DOB: [birth year]  Scott Vasquez is a 80 y.o. year old male who is a primary care patient of [name], NP. I reached out to [place] by phone today in response to a referral sent by Mr. Scott Vasquez's PCP, [name], NP.   Scott Vasquez was given information about Chronic Care Management services today including:  CCM service includes personalized support from designated clinical staff supervised by his physician, including individualized plan of care and coordination with other care providers 24/7 contact phone numbers for assistance for urgent and routine care needs. Service will only be billed when office clinical staff spend 20 minutes or more in a month to coordinate care. Only one practitioner may furnish and bill the service in a calendar month. The patient may stop CCM services at any time (effective at the end of the month) by phone call to the office staff.   Patient agreed to services and verbal consent obtained.   Follow up plan:   [place] Secretary/administrator

## 2021-04-28 ENCOUNTER — Telehealth: Payer: Self-pay | Admitting: Pharmacist

## 2021-04-28 NOTE — Chronic Care Management (AMB) (Signed)
Chronic Care Management Pharmacy Assistant   Name: Collier Bohnet Schartz  MRN: [id number] DOB: 07-21-1941  Reason for Encounter: Chart review for Initial appointment with Jeni Salles Clinical Pharmacist on 04-29-2021 at 10:30 via phone call.     Conditions to be addressed/monitored: HTN, HLD, DMII, and Anemia  Recent office visits:  02-07-2021 [name], NP - Patient presented for Diabetes mellitus type 2 with complications and other concerns. No medication changes.  11-07-2020 - Patient presented for Diabetes mellitus type 2 with complications and other concerns. Changed Piglitazone HCL to 30 mg daily.  Recent consult visits:  03-11-2021 Steffanie [facility]- [facility]) Patient presented for foot care. No other details available.  12-31-2020 [place] (Podiatry) Patient presented for foot care. No other details available.  11-18-2020 [name] (Neurology) - Patient presented for Moderate dementia with behavioral disturbance. No medication changes. Hospital visits:  None in previous 6 months  Medications: Outpatient Encounter Medications as of 04/28/2021  Medication Sig   ACCU-CHEK AVIVA PLUS test strip TEST BLOOD SUGAR TWICE DAILY   atorvastatin (LIPITOR) 40 MG tablet TAKE 1 TABLET EVERY DAY   Blood Glucose Monitoring Suppl (ACCU-CHEK AVIVA PLUS) w/Device KIT USE AS DIRECTED   citalopram (CELEXA) 20 MG tablet Take 1 tablet (20 mg total) by mouth daily.   clopidogrel (PLAVIX) 75 MG tablet TAKE 1 TABLET EVERY DAY   cyanocobalamin 100 MCG tablet Take 100 mcg by mouth daily.   donepezil (ARICEPT) 10 MG tablet Take 1 tablet (10 mg total) by mouth at bedtime.   Dulaglutide (TRULICITY) 3 YK/5.9DJ SOPN INJECT 3MG (1 PEN) SUBCUTANEOUSLY EVERY WEEK   glimepiride (AMARYL) 1 MG tablet TAKE ONE TABLET BY MOUTH EVERY DAY WITH BREAKFAST   glimepiride (AMARYL) 4 MG tablet Take 1 tablet (4 mg total) by mouth daily with breakfast.   levocetirizine (XYZAL) 5 MG tablet TAKE ONE TABLET BY MOUTH EVERY  EVENING.   levothyroxine (SYNTHROID) 50 MCG tablet TAKE 1 TABLET EVERY DAY   lisinopril (ZESTRIL) 40 MG tablet TAKE 1 TABLET EVERY DAY   memantine (NAMENDA) 10 MG tablet 1 TAB TWICE A DAY   ONETOUCH DELICA LANCETS 57S MISC 1 each by Other route See admin instructions. Check blood sugar twice daily   pioglitazone (ACTOS) 30 MG tablet Take 1 tablet (30 mg total) by mouth daily.   tamsulosin (FLOMAX) 0.4 MG CAPS capsule TAKE 1 CAPSULE EVERY DAY   No facility-administered encounter medications on file as of 04/28/2021.  Fill History : albuterol (PROVENTIL HFA;VENTOLIN HFA) inhaler 09/14/2018 25   atorvastatin (LIPITOR) tablet 04-23-2021 90   ACCU-CHEK AVIVA PLUS W/DEVICE KIT 11/04/2020 90   citalopram (CELEXA) tablet 8-26-/2022 90   clopidogrel (PLAVIX) tablet 08-26-/2022 90   donepezil (ARICEPT) tablet 17/79/3903 90   TRULICITY 3 ES/9.2ZR [place] SOPN 03/26/2021 84   glimepiride (AMARYL) tablet 02/11/2021 90   ACCU-CHEK AVIVA PLUS VI STRP 01/11/2021 90   levothyroxine (SYNTHROID, LEVOTHROID) tablet 04-23-2021 90   lisinopril (PRINIVIL,ZESTRIL) tablet 8-26-/2022 90   memantine (NAMENDA) tablet 11/04/2020 90   pioglitazone (ACTOS) tablet 07-5-/2022 90   tamsulosin (FLOMAX) capsule 0.4 mg 9-14-/2022 90    Spoke to Wife [place] for the call  she requested you speak with her on tomorrow as he has memory issues.  Have you seen any other providers since your last visit? She reports no  Any changes in your medications or health? Wife reports none  Any side effects from any medications? Wife reports none that she is aware of  Do  you have an symptoms or problems not managed by your medications? Wife reports she has questions about his Flomax he has the urge to use the restroom and often goes before he reaches the bathroom.  Any concerns about your health right now? She reports she is concerned with his activity level and hygeine. She reports he only wants to sleep most of the day and  thinks he is becoming weaker, he will not walk with her or son or to the mailbox or in the house and only wants to bathe if they are going out.  Has your provider asked that you check blood pressure, blood sugar, or follow special diet at home? She reports he is checking both pressures and sugars at home has not been writing them down, will write down his readings for today. She repots he enjoys eating, they eat most meals at home and go out to [place] once a week.  Do you get any type of exercise on a regular basis? Wife reports no, he is uninterested in moving around.  Can you think of a goal you would like to reach for your health? She would like to see him active or walking a little at least 2 times a week and to be more routine with his hygiene habits.   Do you have any problems getting your medications? She reports they are happy with the current pharmacy no issues getting/ affording medications.  Is there anything that you would like to discuss during the appointment? She reports none other than the above.  She is aware to have all medications and supplements and readings available for the call.   Care Gaps: Zoster Vaccines - Overdue Foot Exam - Done 03-11-2021 COVID Booster #4 (Moderna) - Overdue Eye Exam - Overdue Flu Vaccine - Overdue  Star Rating Drugs: Piglitazone (Actos) 30 mg - Last filled 02-08-2021 90 DS at [facility]  Lisinopril (Zestril) 40 mg - Last filled 04-04-2021 90 DS at [facility] Glimepiride ( Amaryl) 4 mg - Last filled 02-08-2021 90 DS at [facility] ( Trulicity) 3 mg - Last filled 03-26-2021 84 Ds at [facility] Atorvastatin (Lipitor) 40 mg - Last filled 04-23-2021 90 DS at [facility] History Gaps : Call to [facility] and spoke with pharmacist Elta Guadeloupe who gave a readout of recent fills ; updated dates above.   [place] Clinical Pharmacist Assistant [phone]

## 2021-04-29 ENCOUNTER — Ambulatory Visit (INDEPENDENT_AMBULATORY_CARE_PROVIDER_SITE_OTHER): Payer: Medicare HMO | Admitting: Pharmacist

## 2021-04-29 DIAGNOSIS — I10 Essential (primary) hypertension: Secondary | ICD-10-CM

## 2021-04-29 DIAGNOSIS — IMO0002 Reserved for concepts with insufficient information to code with codable children: Secondary | ICD-10-CM

## 2021-04-29 NOTE — Progress Notes (Signed)
Chronic Care Management Pharmacy Note  05/09/2021 Name:  Blu Lori Clauson MRN:  [id number] DOB:  1941-02-18  Summary: A1c is not at goal < 7 TSH not within target range Pt is still having a sense of urinary urgency  Recommendations/Changes made from today's visit: -Recommended keeping a log of BP readings to separate his from his wife's readings -Recommend increasing Trulicity to 4.5 mg per week -Recommend switching glimepiride to SGLT2 inhbitor -Recommend trial of Myrbetriq for bladder symptoms -Recommended for patient to take 30 mg of Actos as prescribed and 5 mg of glimepiride daily  Plan: DM follow up in 1 month   Subjective: Ridhaan Dreibelbis [place] is an 80 y.o. year old male who is a primary patient of [name], NP.  The CCM team was consulted for assistance with disease management and care coordination needs.    Engaged with patient by telephone for initial visit in response to provider referral for pharmacy case management and/or care coordination services.   Consent to Services:  The patient was given the following information about Chronic Care Management services today, agreed to services, and gave verbal consent: 1. CCM service includes personalized support from designated clinical staff supervised by the primary care provider, including individualized plan of care and coordination with other care providers 2. 24/7 contact phone numbers for assistance for urgent and routine care needs. 3. Service will only be billed when office clinical staff spend 20 minutes or more in a month to coordinate care. 4. Only one practitioner may furnish and bill the service in a calendar month. 5.The patient may stop CCM services at any time (effective at the end of the month) by phone call to the office staff. 6. The patient will be responsible for cost sharing (co-pay) of up to 20% of the service fee (after annual deductible is met). Patient agreed to services and consent obtained.  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 No medication changes.   11-07-2020 - Patient presented for Diabetes mellitus type 2 with complications and other concerns. Changed Piglitazone HCL to 30 mg daily.  Recent consult visits: 03-11-2021 Steffanie Rainwater- [facility]) Patient presented for foot care. No other details available.   12-31-2020 [place] (Podiatry) Patient presented for foot care. No other details available.   11-18-2020 [name] (Neurology) - Patient presented for Moderate dementia with behavioral disturbance. No medication changes.  Hospital visits: None in previous 6 months   Objective:  Lab Results  Component Value Date   CREATININE 1.52 (H) 07/17/2020   BUN 15 07/17/2020   GFR 54.42 (L) 11/01/2019   GFRNONAA 42 (L) 07/17/2020   GFRAA 49 (L) 07/17/2020   NA 140 07/17/2020   K 4.9 07/17/2020   CALCIUM 9.7 07/17/2020   CO2 29 07/17/2020   GLUCOSE 236 (H) 07/17/2020    Lab Results  Component Value Date/Time   HGBA1C 8.1 (A) 02/07/2021 03:34 PM   HGBA1C 10.4 (A) 11/07/2020 01:20 PM   HGBA1C 10.2 (H) 09/09/2020 12:56 PM   HGBA1C 9.9 (H) 07/17/2020 10:25 AM   HGBA1C 7.4 (A) 02/06/2020 09:17 AM   HGBA1C 6.7 07/14/2018 10:09 AM   GFR 54.42 (L) 11/01/2019 09:17 AM   GFR 43.30 (L)  06/23/2019 08:50 AM   MICROALBUR 0.3 03/23/2014 10:27 AM   MICROALBUR 1.3 09/18/2013 09:28 AM    Last diabetic Eye exam:  Lab Results  Component Value Date/Time   HMDIABEYEEXA No Retinopathy 02/28/2020 12:00 AM    Last diabetic Foot exam:  Lab Results  Component Value Date/Time   HMDIABFOOTEX done 08/19/2012 12:00 AM     Lab Results  Component Value  Date   CHOL 136 07/17/2020   HDL 47 07/17/2020   LDLCALC 74 07/17/2020   TRIG 73 07/17/2020   CHOLHDL 2.9 07/17/2020    Hepatic Function Latest Ref Rng & Units 07/17/2020 06/23/2019 04/20/2018  Total Protein 6.1 - 8.1 g/dL 7.1 7.4 6.9  Albumin 3.5 - 5.2 g/dL - 4.2 4.0  AST 10 - 35 U/L 14 11 8   ALT 9 - 46 U/L 10 8 7   Alk Phosphatase 39 - 117 U/L - 117 118(H)  Total Bilirubin 0.2 - 1.2 mg/dL 0.3 0.3 0.5  Bilirubin, Direct 0.0 - 0.3 mg/dL - - 0.1    Lab Results  Component Value Date/Time   TSH 5.37 (H) 09/09/2020 12:56 PM   TSH 9.93 (H) 07/17/2020 10:25 AM   FREET4 0.78 02/22/2015 11:51 AM   FREET4 0.79 01/25/2015 03:35 PM    CBC Latest Ref Rng & Units 07/17/2020 06/23/2019 12/24/2017  WBC 3.8 - 10.8 Thousand/uL 7.4 5.6 6.1  Hemoglobin 13.2 - 17.1 g/dL 11.5(L) 11.4(L) 12.1(L)  Hematocrit 38.5 - 50.0 % 36.4(L) 35.4(L) 37.2(L)  Platelets 140 - 400 Thousand/uL 268 196.0 292.0    No results found for: VD25OH  Clinical ASCVD: Yes  The ASCVD Risk score (Arnett DK, et al., 2019) failed to calculate for the following reasons:   The 2019 ASCVD risk score is only valid for ages 16 to 66   The patient has a prior MI or stroke diagnosis    Depression screen [facility] 2/9 02/07/2021 12/22/2019 12/24/2017  Decreased Interest 0 0 0  Down, Depressed, Hopeless 0 0 0  PHQ - 2 Score 0 0 0  Some recent data might be hidden     Social History   Tobacco Use  Smoking Status Former   Packs/day: 0.50   Years: 20.00   Pack years: 10.00   Types: Cigarettes   Start date: 47   Quit date: 08/10/1958   Years since quitting: 62.7  Smokeless Tobacco Never   BP Readings from Last 3 Encounters:  02/07/21 122/80  11/18/20 (!) 146/92  11/07/20 132/78   Pulse Readings from Last 3 Encounters:  02/07/21 85  11/18/20 (!) 101  11/07/20 97   Wt Readings from Last 3 Encounters:  02/07/21 196 lb (88.9 kg)  11/18/20 206 lb 6.4 oz (93.6 kg)  11/07/20 205 lb 6.4 oz (93.2 kg)   BMI Readings from Last 3  Encounters:  02/07/21 28.12 kg/m  11/18/20 29.62 kg/m  11/07/20 30.33 kg/m    Assessment/Interventions: Review of patient past medical history, allergies, medications, health status, including review of consultants reports, laboratory and other test data, was performed as part of comprehensive evaluation and provision of chronic care management services.   SDOH:  (Social Determinants of Health) assessments and interventions performed: Yes SDOH Interventions    Flowsheet Row Most Recent Value  SDOH Interventions   Financial Strain Interventions Intervention Not Indicated  Transportation Interventions Intervention Not Indicated      SDOH Screenings   Alcohol Screen: Not on file  Depression (PHQ2-9): Low Risk    PHQ-2 Score: 0  Financial Resource  Strain: Low Risk    Difficulty of Paying Living Expenses: Not hard at all  Food Insecurity: Not on file  Housing: Not on file  Physical Activity: Not on file  Social Connections: Not on file  Stress: Not on file  Tobacco Use: Medium Risk   Smoking Tobacco Use: Former   Smokeless Tobacco Use: Never  Transportation Needs: No Transportation Needs   Lack of Transportation (Medical): No   Lack of Transportation (Non-Medical): No   Spoke with patient's wife, who tries to help manage his medications. She puts them in a pillbox and does the ordering for them. She reports she knows what medications are and what they are for. She is struggling the most with getting the patient to take care of himself.   He does not shower often or have interest in keeping up with personal hygiene. He is also not active and sits around all day long either watching TV or laying in bed. Patient does not do any exercises. He doesn't have anywhere to go which is why he won't do anything. He wouldn't go to a senior center and they used to go to church but stopped when he got sick  Patient's wife denies any specific side effects with the medications but doesn't think  the Flomax is helping. He has a sudden urgency to go to the bathroom and sometimes misses the toilet.  Patient drinks mostly diet Mt. Dew (2-3 cans per day) and does not drink water often. He also does not drink alcohol or smoke.  Patient's wife does the cooking and they eat out 2-3 times a week. The meals usually consist of a vegetable and a meat and do eat some fried chicken. Their son and daughter will bring food as well. He does eat a lot of desserts about 3 times a week.  CCM Care Plan  Allergies  Allergen Reactions   Actos [Pioglitazone]     Diarrhea    Metformin And Related     Diarrhea      Medications Reviewed Today     Reviewed by Viona Gilmore, [facility] (Pharmacist) on 04/29/21 at 1141  Med List Status: <None>   Medication Order Taking? Sig Documenting Provider Last Dose Status Informant  ACCU-CHEK AVIVA PLUS test strip [id number]  TEST BLOOD SUGAR TWICE DAILY Nafziger, Tommi Rumps, NP  Active   atorvastatin (LIPITOR) 40 MG tablet [id number] Yes TAKE 1 TABLET EVERY DAY Nafziger, Cory, NP Taking Active   Blood Glucose Monitoring Suppl (ACCU-CHEK AVIVA PLUS) w/Device KIT [id number]  USE AS DIRECTED Nafziger, Tommi Rumps, NP  Active   citalopram (CELEXA) 20 MG tablet [id number] Yes Take 1 tablet (20 mg total) by mouth daily. Rondel Jumbo, PA-C Taking Active   clopidogrel (PLAVIX) 75 MG tablet [id number] Yes TAKE 1 TABLET EVERY DAY Nafziger, Cory, NP Taking Active   cyanocobalamin 1000 MCG tablet [id number] Yes Take 1,000 mcg by mouth daily. [provider] Taking Active Spouse/Significant Other  donepezil (ARICEPT) 10 MG tablet [id number] Yes Take 1 tablet (10 mg total) by mouth at bedtime. Rondel Jumbo, PA-C Taking Active   Dulaglutide (TRULICITY) 3 OL/0.7EM Bon[place] Aid [id number] Yes INJECT 3MG (1 PEN) SUBCUTANEOUSLY EVERY WEEK Nafziger, Tommi Rumps, NP Taking Active   glimepiride (AMARYL) 1 MG tablet [id number] No TAKE ONE TABLET BY MOUTH EVERY DAY WITH BREAKFAST  Patient not taking: Reported on  04/29/2021   [name], NP Not Taking Active   glimepiride (AMARYL) 4 MG tablet [id number] Yes Take 1 tablet (4 mg total) by  mouth daily with breakfast. [name], NP Taking Active   levocetirizine (XYZAL) 5 MG tablet [id number] Yes TAKE ONE TABLET BY MOUTH EVERY EVENING. Nafziger, Tommi Rumps, NP Taking Active   levothyroxine (SYNTHROID) 50 MCG tablet [id number] Yes TAKE 1 TABLET EVERY DAY Nafziger, Cory, NP Taking Active   lisinopril (ZESTRIL) 40 MG tablet [id number] Yes TAKE 1 TABLET EVERY DAY Nafziger, Cory, NP Taking Active   memantine (NAMENDA) 10 MG tablet [id number] Yes 1 TAB TWICE A DAY Rondel Jumbo, PA-C Taking Active   [facility] DELICA LANCETS 09T MISC [id number]  1 each by Other route See admin instructions. Check blood sugar twice daily [provider]  Active Spouse/Significant Other           Med Note Marlene Bast Apr 25, 2015  9:36 AM)    pioglitazone (ACTOS) 30 MG tablet [id number] No Take 1 tablet (30 mg total) by mouth daily.  Patient not taking: Reported on 04/29/2021   [name], NP Not Taking Active   tamsulosin (FLOMAX) 0.4 MG CAPS capsule [id number] Yes TAKE 1 CAPSULE EVERY DAY Nafziger, Cory, NP Taking Active             Patient Active Problem List   Diagnosis Date Noted   Moderate dementia with behavioral disturbance ([place]) 05/18/2019   Thyroid mass 04/29/2015   Diabetes mellitus type 2 with complications, uncontrolled ([place])    Acute lacunar stroke [facility])    Stroke with cerebral ischemia ([place])    Thalamic infarct, acute ([place]) 10/20/2014   PERSONAL HX OF METHICILLIN RESIST STAPH AUREUS 09/17/2008   Hyperlipidemia 04/01/2007   B12 deficiency 02/08/2007   Iron deficiency anemia 02/08/2007   Essential hypertension 02/08/2007   DIVERTICULOSIS, COLON 02/08/2007   CEREBROVASCULAR ACCIDENT, HX OF 02/08/2007    Immunization History  Administered Date(s) Administered   Fluad Quad(high Dose 65+) 06/23/2019, 05/08/2020   Influenza Split  07/13/2011, 08/19/2012   Influenza Whole 08/15/2004, 06/10/2007, 05/17/2008, 05/03/2009, 05/02/2010   Influenza, High Dose Seasonal PF 06/07/2015, 05/29/2016, 05/11/2017, 07/14/2018   Influenza,inj,Quad PF,6+ Mos 05/26/2013, 04/20/2014   Moderna Sars-Covid-2 Vaccination 11/02/2019, 11/30/2019, 06/26/2020   Pneumococcal Conjugate-13 11/26/2014   Pneumococcal Polysaccharide-23 01/16/2011   Td 08/10/2004   Tdap 11/26/2014    Conditions to be addressed/monitored:  Hypertension, Hyperlipidemia, Diabetes, BPH, and History of stoke, dementia  Care Plan : [place]  Updates made by Viona Gilmore, [place] since 05/09/2021 12:00 AM     Problem: Problem: Hypertension, Hyperlipidemia, Diabetes, BPH, and History of stoke, dementia      Long-Range Goal: Patient-Specific Goal   Start Date: 04/29/2021  Expected End Date: 04/29/2022  This Visit's Progress: On track  Priority: High  Note:   Current Barriers:  Unable to independently monitor therapeutic efficacy Unable to achieve control of diabetes   Pharmacist Clinical Goal(s):  Patient will achieve adherence to monitoring guidelines and medication adherence to achieve therapeutic efficacy achieve control of diabetes as evidenced by A1c adhere to prescribed medication regimen as evidenced by fill history  through collaboration with PharmD and provider.   Interventions: 1:1 collaboration with [name], NP regarding development and update of comprehensive plan of care as evidenced by provider attestation and co-signature Inter-disciplinary care team collaboration (see longitudinal plan of care) Comprehensive medication review performed; medication list updated in electronic medical record  Hypertension (BP goal <140/90) -Not ideally controlled -Current treatment: Lisinopril 40 mg 1 tablet daily -Medications previously tried: n/a  -Current home readings: checks but unsure of readings  as wife uses the same cuff -Current  dietary habits: tries to limit salt intake -Current exercise habits: non -Denies hypotensive/hypertensive symptoms -Educated on BP goals and benefits of medications for prevention of heart attack, stroke and kidney damage; Daily salt intake goal < 2300 mg; Importance of home blood pressure monitoring; Proper BP monitoring technique; -Counseled to monitor BP at home at least weekly, document, and provide log at future appointments -Counseled on diet and exercise extensively Recommended to continue current medication Recommended keeping a log of BP readings to separate his from his wife's readings.  Hyperlipidemia: (LDL goal < 70) -Not ideally controlled -Current treatment: Atorvastatin 40 mg 1 tablet daily -Medications previously tried: none  -Current dietary patterns: eating 4 eggs per day -Current exercise habits: none -Educated on Cholesterol goals;  Importance of limiting foods high in cholesterol; Exercise goal of 150 minutes per week; -Counseled on diet and exercise extensively Recommended to continue current medication Recommended cutting back to 3 eggs per day.  Diabetes (A1c goal <7%) -Uncontrolled -Current medications: Glimepiride 4 mg 1 tablet daily Glimepiride 1 mg 1 tablet daily - she has it but he isn't taking it Trulicity 3 mg inject once weekly - every Monday Pioglitazone 30 mg 1 tablet daily - taking the 15 mg tablets -Medications previously tried: metformin (kidney function)  -Current home glucose readings fasting glucose: 149, 147, 153, 134, 126, 107, 122, 146, 139 post prandial glucose: does not check -Denies hypoglycemic/hyperglycemic symptoms -Current meal patterns:  breakfast: 4 eggs and cheese toast for breakfast lunch: n/a  dinner: n/a snacks: sweets drinks: diet mountain dew -Current exercise: none -Educated on A1c and blood sugar goals; Complications of diabetes including kidney damage, retinal damage, and cardiovascular disease; Benefits of  routine self-monitoring of blood sugar; Carbohydrate counting and/or plate method -Counseled to check feet daily and get yearly eye exams -Counseled on diet and exercise extensively Counseled on correct directions for taking Pioglitazone 30 mg daily and glimepiride 4 mg and 1 mg tablet daily. Recommended increasing Trulicity to 4.5 mg per week.  Depression/Anxiety (Goal: minimize symptoms) -Not ideally controlled -Current treatment: Citalopram 20 mg 1 tablet daily -Medications previously tried/failed: none -PHQ9: 0 -GAD7: n/a -Educated on Benefits of medication for symptom control - Reassess effectiveness at follow up.  History of strok (Goal: prevent strokes) -Controlled -Current treatment  Clopidogrel 75 mg 1 tablet daily -Medications previously tried: none  -Recommended to continue current medication   Depression/Anxiety  (Goal: minimize symptoms) -Uncontrolled -Current treatment  Citalopram 20 mg 1 tablet daily -Medications previously tried: none  -Recommended to continue current medication  Memory loss (Goal: slow memory loss) -Controlled -Current treatment  Donepezil 10 mg 1 tablet at bedtime Memantine 10 mg 1 tablet twice daily -Medications previously tried: none  -Counseled on diet and exercise extensively Recommended to continue current medication  BPH (Goal: minimize symptoms) -Uncontrolled -Current treatment  Tamsulosin 0.4 mg 1 capsule daily -Medications previously tried: none  -Recommended trial of overactive bladder medication.  Hypothyroidism (Goal: TSH 0.35-4.5) -Uncontrolled -Current treatment  Levothyroxine 50 mcg 1 tablet daily -Medications previously tried: none  -Counseled on the importance of taking this medication separate from all others and at least 30 minutes prior to food.  Health Maintenance -Vaccine gaps: shingrix, influenza, COVID booster -Current therapy:  Levocetirizine 5 mg 1 tablet daily Vitamin B12 1000 mcg 1 tablet  daily -Educated on Cost vs benefit of each product must be carefully weighed by individual consumer -Patient is satisfied with current therapy and denies issues -Recommended  to continue current medication  Patient Goals/Self-Care Activities Patient will:  - take medications as prescribed check glucose daily, document, and provide at future appointments check blood pressure weekly, document, and provide at future appointments target a minimum of 150 minutes of moderate intensity exercise weekly  Follow Up Plan: Telephone follow up appointment with care management team member scheduled for: 3 months       Medication Assistance: None required.  Patient affirms current coverage meets needs.  Compliance/Adherence/Medication fill history: Care Gaps: Shingrix, foot exam, COVID booster, eye exam, influenza vaccine  Star-Rating Drugs: Piglitazone (Actos) 30 mg - Last filled 02-08-2021 90 DS at [facility]  Lisinopril (Zestril) 40 mg - Last filled 04-04-2021 90 DS at [facility] Glimepiride ( Amaryl) 4 mg - Last filled 02-08-2021 90 DS at [facility] ( Trulicity) 3 mg - Last filled 03-26-2021 84 Ds at [facility] Atorvastatin (Lipitor) 40 mg - Last filled 04-23-2021 90 DS at [facility]  Patient's preferred pharmacy is:  [facility], [place] [address] Phone: [phone] Fax: [phone]  Uses pill box? Yes Pt endorses 70% compliance  We discussed: Benefits of medication synchronization, packaging and delivery as well as enhanced pharmacist oversight with [employer]. Patient decided to: Continue current medication management strategy  Care Plan and Follow Up Patient Decision:  Patient agrees to Care Plan and Follow-up.  Plan: Telephone follow up appointment with care management team member scheduled for:  3 months  Jeni Salles, PharmD, [place] Pharmacist [place] [facility] [place] [phone]

## 2021-05-09 DIAGNOSIS — I10 Essential (primary) hypertension: Secondary | ICD-10-CM

## 2021-05-09 DIAGNOSIS — E1165 Type 2 diabetes mellitus with hyperglycemia: Secondary | ICD-10-CM

## 2021-05-09 DIAGNOSIS — E118 Type 2 diabetes mellitus with unspecified complications: Secondary | ICD-10-CM | POA: Diagnosis not present

## 2021-05-09 NOTE — Patient Instructions (Addendum)
Hi Scott Vasquez,  It was great to get to meet you over the telephone! Below is a summary of some of the topics we discussed.   Please reach out to me if you have any questions or need anything before our follow up!  Best, Maddie  Jeni Salles, PharmD, [facility]   Visit Information   Goals Addressed             This Visit's Progress    Manage My Medicine       Timeframe:  Long-Range Goal Priority:  High Start Date:                             Expected End Date:                       Follow Up Date 06/08/21    - keep a list of all the medicines I take; vitamins and herbals too - learn to read medicine labels - use a pillbox to sort medicine    Why is this important?   These steps will help you keep on track with your medicines.   Notes:        Patient Care Plan: CCM Pharmacy Care Plan     Problem Identified: Problem: Hypertension, Hyperlipidemia, Diabetes, BPH, and History of stoke, dementia      Long-Range Goal: Patient-Specific Goal   Start Date: 04/29/2021  Expected End Date: 04/29/2022  This Visit's Progress: On track  Priority: High  Note:   Current Barriers:  Unable to independently monitor therapeutic efficacy Unable to achieve control of diabetes   Pharmacist Clinical Goal(s):  Patient will achieve adherence to monitoring guidelines and medication adherence to achieve therapeutic efficacy achieve control of diabetes as evidenced by A1c adhere to prescribed medication regimen as evidenced by fill history  through collaboration with PharmD and provider.   Interventions: 1:1 collaboration with [name], NP regarding development and update of comprehensive plan of care as evidenced by provider attestation and co-signature Inter-disciplinary care team collaboration (see longitudinal plan of care) Comprehensive medication review performed; medication list updated in electronic  medical record  Hypertension (BP goal <140/90) -Not ideally controlled -Current treatment: Lisinopril 40 mg 1 tablet daily -Medications previously tried: n/a  -Current home readings: checks but unsure of readings as wife uses the same cuff -Current dietary habits: tries to limit salt intake -Current exercise habits: non -Denies hypotensive/hypertensive symptoms -Educated on BP goals and benefits of medications for prevention of heart attack, stroke and kidney damage; Daily salt intake goal < 2300 mg; Importance of home blood pressure monitoring; Proper BP monitoring technique; -Counseled to monitor BP at home at least weekly, document, and provide log at future appointments -Counseled on diet and exercise extensively Recommended to continue current medication Recommended keeping a log of BP readings to separate his from his wife's readings.  Hyperlipidemia: (LDL goal < 70) -Not ideally controlled -Current treatment: Atorvastatin 40 mg 1 tablet daily -Medications previously tried: none  -Current dietary patterns: eating 4 eggs per day -Current exercise habits: none -Educated on Cholesterol goals;  Importance of limiting foods high in cholesterol; Exercise goal of 150 minutes per week; -Counseled on diet and exercise extensively Recommended to continue current medication Recommended cutting back to 3 eggs per day.  Diabetes (A1c goal <7%) -Uncontrolled -Current medications: Glimepiride 4 mg 1 tablet daily Glimepiride 1 mg 1 tablet daily -  she has it but he isn't taking it Trulicity 3 mg inject once weekly - every Monday Pioglitazone 30 mg 1 tablet daily - taking the 15 mg tablets -Medications previously tried: metformin (kidney function)  -Current home glucose readings fasting glucose: 149, 147, 153, 134, 126, 107, 122, 146, 139 post prandial glucose: does not check -Denies hypoglycemic/hyperglycemic symptoms -Current meal patterns:  breakfast: 4 eggs and cheese toast for  breakfast lunch: n/a  dinner: n/a snacks: sweets drinks: diet mountain dew -Current exercise: none -Educated on A1c and blood sugar goals; Complications of diabetes including kidney damage, retinal damage, and cardiovascular disease; Benefits of routine self-monitoring of blood sugar; Carbohydrate counting and/or plate method -Counseled to check feet daily and get yearly eye exams -Counseled on diet and exercise extensively Counseled on correct directions for taking Pioglitazone 30 mg daily and glimepiride 4 mg and 1 mg tablet daily. Recommended increasing Trulicity to 4.5 mg per week.  Depression/Anxiety (Goal: minimize symptoms) -Not ideally controlled -Current treatment: Citalopram 20 mg 1 tablet daily -Medications previously tried/failed: none -PHQ9: 0 -GAD7: n/a -Educated on Benefits of medication for symptom control - Reassess effectiveness at follow up.  History of strok (Goal: prevent strokes) -Controlled -Current treatment  Clopidogrel 75 mg 1 tablet daily -Medications previously tried: none  -Recommended to continue current medication   Depression/Anxiety  (Goal: minimize symptoms) -Uncontrolled -Current treatment  Citalopram 20 mg 1 tablet daily -Medications previously tried: none  -Recommended to continue current medication  Memory loss (Goal: slow memory loss) -Controlled -Current treatment  Donepezil 10 mg 1 tablet at bedtime Memantine 10 mg 1 tablet twice daily -Medications previously tried: none  -Counseled on diet and exercise extensively Recommended to continue current medication  BPH (Goal: minimize symptoms) -Uncontrolled -Current treatment  Tamsulosin 0.4 mg 1 capsule daily -Medications previously tried: none  -Recommended trial of overactive bladder medication.  Hypothyroidism (Goal: TSH 0.35-4.5) -Uncontrolled -Current treatment  Levothyroxine 50 mcg 1 tablet daily -Medications previously tried: none  -Counseled on the importance of  taking this medication separate from all others and at least 30 minutes prior to food.  Health Maintenance -Vaccine gaps: shingrix, influenza, COVID booster -Current therapy:  Levocetirizine 5 mg 1 tablet daily Vitamin B12 1000 mcg 1 tablet daily -Educated on Cost vs benefit of each product must be carefully weighed by individual consumer -Patient is satisfied with current therapy and denies issues -Recommended to continue current medication  Patient Goals/Self-Care Activities Patient will:  - take medications as prescribed check glucose daily, document, and provide at future appointments check blood pressure weekly, document, and provide at future appointments target a minimum of 150 minutes of moderate intensity exercise weekly  Follow Up Plan: Telephone follow up appointment with care management team member scheduled for: 3 months      Mr. Wisehart was given information about Chronic Care Management services today including:  CCM service includes personalized support from designated clinical staff supervised by his physician, including individualized plan of care and coordination with other care providers 24/7 contact phone numbers for assistance for urgent and routine care needs. Standard insurance, coinsurance, copays and deductibles apply for chronic care management only during months in which we provide at least 20 minutes of these services. Most insurances cover these services at 100%, however patients may be responsible for any copay, coinsurance and/or deductible if applicable. This service may help you avoid the need for more expensive face-to-face services. Only one practitioner may furnish and bill the service in a calendar month. The patient  may stop CCM services at any time (effective at the end of the month) by phone call to the office staff.  Patient agreed to services and verbal consent obtained.   The patient verbalized understanding of instructions, educational  materials, and care plan provided today and agreed to receive a mailed copy of patient instructions, educational materials, and care plan.  Telephone follow up appointment with pharmacy team member scheduled for: 3 months  DAVON ABDELAZIZ, [facility]

## 2021-05-13 ENCOUNTER — Ambulatory Visit: Payer: Medicare HMO | Admitting: Adult Health

## 2021-05-15 ENCOUNTER — Other Ambulatory Visit: Payer: Self-pay

## 2021-05-16 ENCOUNTER — Ambulatory Visit (INDEPENDENT_AMBULATORY_CARE_PROVIDER_SITE_OTHER): Payer: Medicare HMO | Admitting: Adult Health

## 2021-05-16 ENCOUNTER — Ambulatory Visit: Payer: Medicare HMO

## 2021-05-16 ENCOUNTER — Encounter: Payer: Self-pay | Admitting: Adult Health

## 2021-05-16 VITALS — BP 136/70 | HR 97 | Temp 98.5°F | Ht 70.0 in | Wt 206.0 lb

## 2021-05-16 DIAGNOSIS — Z23 Encounter for immunization: Secondary | ICD-10-CM | POA: Diagnosis not present

## 2021-05-16 DIAGNOSIS — I10 Essential (primary) hypertension: Secondary | ICD-10-CM | POA: Diagnosis not present

## 2021-05-16 DIAGNOSIS — N3281 Overactive bladder: Secondary | ICD-10-CM | POA: Diagnosis not present

## 2021-05-16 DIAGNOSIS — E1165 Type 2 diabetes mellitus with hyperglycemia: Secondary | ICD-10-CM

## 2021-05-16 LAB — POCT GLYCOSYLATED HEMOGLOBIN (HGB A1C): Hemoglobin A1C: 8.9 % — AB (ref 4.0–5.6)

## 2021-05-16 MED ORDER — OXYBUTYNIN CHLORIDE ER 10 MG PO TB24: 10.0000 mg | ORAL_TABLET | Freq: Every day | ORAL | 0 refills | Status: DC

## 2021-05-16 MED ORDER — PIOGLITAZONE HCL 30 MG PO TABS: 30.0000 mg | ORAL_TABLET | Freq: Every day | ORAL | 0 refills | Status: DC

## 2021-05-16 MED ORDER — GLIPIZIDE ER 10 MG PO TB24: 10.0000 mg | ORAL_TABLET | Freq: Every day | ORAL | 1 refills | Status: DC

## 2021-05-16 NOTE — Patient Instructions (Addendum)
Your A1c was 8.9 - this has increased from 8.2  I am going to have you stop Amaryl   I have prescribed Glipizide 10 mg ER - you take this once a day   I am going to have you stop Flomax and I have sent in a different medication called Ditropan   Follow up in three months for your physical exam - come to this appointment fasting.

## 2021-05-16 NOTE — Progress Notes (Signed)
Subjective:    Patient ID: Scott Vasquez, male    DOB: 06-05-1941, 80 y.o.   MRN: [id number]  HPI 80 year old male who  has a past medical history of Anemia, Diabetes mellitus, Diverticulosis of colon, Hypertension, Stroke ([place]), and Thyroid nodule.  Presents to the office today for 34-monthfollow-up regarding diabetes and hypertension.  Hypertension-takes lisinopril 40 mg daily.  He denies dizziness, lightheadedness, chest pain, or shortness of breath BP Readings from Last 3 Encounters:  05/16/21 136/70  02/07/21 122/80  11/18/20 (!) 146/92   DM -currently prescribed Trulicity 3 mg weekly, Amaryl 5 mg twice daily and Actos 30 mg.  During his last visit his A1c had dropped from 10.4-8.1.  In speaking with our clinical pharmacist, there seemed to be some confusion, he was only taking glimepiride 4 mg and Actos 15 mg.  I think it was confusing for him to take 2 strengths of glimepiride to equal 5 mg.  Lab Results  Component Value Date   HGBA1C 8.1 (A) 02/07/2021    He also does not feel as though Flomax is working well for him. He continues to have sudden urgency, nocturia, and incontinence. Denies UTI like symptoms   Review of Systems See HPI   Past Medical History:  Diagnosis Date   Anemia    iron deficiency   Diabetes mellitus    type II   Diverticulosis of colon    Hypertension    Stroke (HCC)    numbness in left hand   Thyroid nodule     Social History   Socioeconomic History   Marital status: Married    Spouse name: Not on file   Number of children: Not on file   Years of education: Not on file   Highest education level: Not on file  Occupational History   Occupation: retired    EFish farm manager [employer] Tobacco Use   Smoking status: Former    Packs/day: 0.50    Years: 20.00    Pack years: 10.00    Types: Cigarettes    Start date: 1953    Quit date: 08/10/1958    Years since quitting: 62.8   Smokeless tobacco: Never  Vaping Use   Vaping Use: Never used   Substance and Sexual Activity   Alcohol use: No   Drug use: No   Sexual activity: Not on file  Other Topics Concern   Not on file  Social History Narrative   Pt is R handed   Lives in single story home with his wife, CHoyle Sauer  2 adult children   8th grade education   Retired dTheatre stage managerfrom [employer]  Married 577years   Social Determinants of HRadio broadcast assistantStrain: Low Risk    Difficulty of Paying Living Expenses: Not hard at all  Food Insecurity: Not on file  Transportation Needs: No Transportation Needs   Lack of Transportation (Medical): No   Lack of Transportation (Non-Medical): No  Physical Activity: Not on file  Stress: Not on file  Social Connections: Not on file  Intimate Partner Violence: Not on file    Past Surgical History:  Procedure Laterality Date   BACK SURGERY     CATARACT EXTRACTION, BILATERAL     SHOULDER SURGERY  08/2007   Supple   THYROID LOBECTOMY Left 04/29/2015   THYROID LOBECTOMY Left 04/29/2015   Procedure: THYROID LOBECTOMY;  Surgeon: TArmandina Gemma MD;  [facility]  Service: General;  Laterality: Left;  Family History  Problem Relation Age of Onset   Diabetes Other    Diabetes Father        Died from diabetic coma in October 26, 1970   Hypertension Father    Stroke Mother        Died in 27   Hypertension Mother     Allergies  Allergen Reactions   Actos [Pioglitazone]     Diarrhea    Metformin And Related     Diarrhea      Current Outpatient Medications on File Prior to Visit  Medication Sig Dispense Refill   ACCU-CHEK AVIVA PLUS test strip TEST BLOOD SUGAR TWICE DAILY 200 strip 3   atorvastatin (LIPITOR) 40 MG tablet TAKE 1 TABLET EVERY DAY 90 tablet 3   Blood Glucose Monitoring Suppl (ACCU-CHEK AVIVA PLUS) w/Device KIT USE AS DIRECTED 1 kit 0   citalopram (CELEXA) 20 MG tablet Take 1 tablet (20 mg total) by mouth daily. 90 tablet 3   clopidogrel (PLAVIX) 75 MG tablet TAKE 1 TABLET EVERY DAY 90 tablet 3    cyanocobalamin 1000 MCG tablet Take 1,000 mcg by mouth daily.     donepezil (ARICEPT) 10 MG tablet Take 1 tablet (10 mg total) by mouth at bedtime. 90 tablet 3   Dulaglutide (TRULICITY) 3 NW/2.9FA SOPN INJECT 3MG (1 PEN) SUBCUTANEOUSLY EVERY WEEK 12 mL 2   glimepiride (AMARYL) 1 MG tablet TAKE ONE TABLET BY MOUTH EVERY DAY WITH BREAKFAST 90 tablet 0   glimepiride (AMARYL) 4 MG tablet Take 1 tablet (4 mg total) by mouth daily with breakfast. 90 tablet 0   levocetirizine (XYZAL) 5 MG tablet TAKE ONE TABLET BY MOUTH EVERY EVENING. 30 tablet 5   levothyroxine (SYNTHROID) 50 MCG tablet TAKE 1 TABLET EVERY DAY 90 tablet 3   lisinopril (ZESTRIL) 40 MG tablet TAKE 1 TABLET EVERY DAY 90 tablet 3   memantine (NAMENDA) 10 MG tablet 1 TAB TWICE A DAY 180 tablet 3   ONETOUCH DELICA LANCETS 21H MISC 1 each by Other route See admin instructions. Check blood sugar twice daily  0   pioglitazone (ACTOS) 30 MG tablet Take 1 tablet (30 mg total) by mouth daily. 90 tablet 0   tamsulosin (FLOMAX) 0.4 MG CAPS capsule TAKE 1 CAPSULE EVERY DAY 90 capsule 3   No current facility-administered medications on file prior to visit.    BP 136/70   Pulse 97   Temp 98.5 F (36.9 C) (Oral)   Ht 5' 10"  (1.778 m)   Wt 206 lb (93.4 kg)   SpO2 98%   BMI 29.56 kg/m       Objective:   Physical Exam Vitals and nursing note reviewed.  Constitutional:      Appearance: Normal appearance.  Cardiovascular:     Rate and Rhythm: Normal rate and regular rhythm.     Pulses: Normal pulses.     Heart sounds: Normal heart sounds.  Skin:    General: Skin is warm and dry.     Capillary Refill: Capillary refill takes less than 2 seconds.  Neurological:     General: No focal deficit present.     Mental Status: He is alert and oriented to person, place, and time.       Assessment & Plan:  1. Uncontrolled type 2 diabetes mellitus with hyperglycemia (HCC)  - POC HgB A1c- 8.9 - has increased - Will d/c Amaryl.  - SLGT2 were  not covered by his insurance  - will place him on glipizide 10 mg  ER - Follow up in three months for CPE  - pioglitazone (ACTOS) 30 MG tablet; Take 1 tablet (30 mg total) by mouth daily.  Dispense: 90 tablet; Refill: 0 - glipiZIDE (GLUCOTROL XL) 10 MG 24 hr tablet; Take 1 tablet (10 mg total) by mouth daily with breakfast.  Dispense: 90 tablet; Refill: 1  2. Essential hypertension - Controlled. No change in medications   3. OAB (overactive bladder) - D/c Flomax.  - oxybutynin (DITROPAN XL) 10 MG 24 hr tablet; Take 1 tablet (10 mg total) by mouth at bedtime.  Dispense: 90 tablet; Refill: 0 - Follow up in 3 months or sooner if needed  4. Need for immunization against influenza  - Flu Vaccine QUAD High Dose(Fluad)  [name], NP

## 2021-05-20 ENCOUNTER — Ambulatory Visit: Payer: Medicare HMO | Admitting: Physician Assistant

## 2021-05-20 ENCOUNTER — Encounter: Payer: Self-pay | Admitting: Physician Assistant

## 2021-05-20 ENCOUNTER — Other Ambulatory Visit: Payer: Self-pay

## 2021-05-20 ENCOUNTER — Telehealth: Payer: Self-pay | Admitting: Pharmacist

## 2021-05-20 VITALS — BP 161/83 | HR 96 | Resp 18 | Ht 70.0 in | Wt 208.0 lb

## 2021-05-20 DIAGNOSIS — F03B18 Unspecified dementia, moderate, with other behavioral disturbance: Secondary | ICD-10-CM | POA: Diagnosis not present

## 2021-05-20 MED ORDER — MEMANTINE HCL 10 MG PO TABS: ORAL_TABLET | ORAL | 3 refills | Status: DC

## 2021-05-20 MED ORDER — DONEPEZIL HCL 10 MG PO TABS: 10.0000 mg | ORAL_TABLET | Freq: Every day | ORAL | 3 refills | Status: DC

## 2021-05-20 MED ORDER — CITALOPRAM HYDROBROMIDE 20 MG PO TABS: 20.0000 mg | ORAL_TABLET | Freq: Every day | ORAL | 3 refills | Status: DC

## 2021-05-20 NOTE — Chronic Care Management (AMB) (Signed)
Chronic Care Management Pharmacy Assistant   Name: Scott Vasquez  MRN: [id number] DOB: Jan 31, 1941   Reason for Encounter: Disease State/ Diabetes Assessment Call   Conditions to be addressed/monitored: DMII  Recent office visits:  05/16/2021 Scott Peng, NP - Patient presented for Uncontrolled type 2 diabetes mellitus with hyperglycemia and other concerns. Prescribed Glipizide 10 mg daily with breakfast, & Oxybutynin Chloride 10 mg at bedtime. Stopped Glimepiride & Tamsulosin.  Recent consult visits:  None  Hospital visits:  None in previous 6 months  Medications: Outpatient Encounter Medications as of 05/20/2021  Medication Sig   ACCU-CHEK AVIVA PLUS test strip TEST BLOOD SUGAR TWICE DAILY   atorvastatin (LIPITOR) 40 MG tablet TAKE 1 TABLET EVERY DAY   Blood Glucose Monitoring Suppl (ACCU-CHEK AVIVA PLUS) w/Device KIT USE AS DIRECTED   citalopram (CELEXA) 20 MG tablet Take 1 tablet (20 mg total) by mouth daily.   clopidogrel (PLAVIX) 75 MG tablet TAKE 1 TABLET EVERY DAY   cyanocobalamin 1000 MCG tablet Take 1,000 mcg by mouth daily.   donepezil (ARICEPT) 10 MG tablet Take 1 tablet (10 mg total) by mouth at bedtime.   Dulaglutide (TRULICITY) 3 AQ/7.6AU SOPN INJECT 3MG (1 PEN) SUBCUTANEOUSLY EVERY WEEK   glipiZIDE (GLUCOTROL XL) 10 MG 24 hr tablet Take 1 tablet (10 mg total) by mouth daily with breakfast.   levocetirizine (XYZAL) 5 MG tablet TAKE ONE TABLET BY MOUTH EVERY EVENING.   levothyroxine (SYNTHROID) 50 MCG tablet TAKE 1 TABLET EVERY DAY   lisinopril (ZESTRIL) 40 MG tablet TAKE 1 TABLET EVERY DAY   memantine (NAMENDA) 10 MG tablet 1 TAB TWICE A DAY   ONETOUCH DELICA LANCETS 63F MISC 1 each by Other route See admin instructions. Check blood sugar twice daily   oxybutynin (DITROPAN XL) 10 MG 24 hr tablet Take 1 tablet (10 mg total) by mouth at bedtime.   pioglitazone (ACTOS) 30 MG tablet Take 1 tablet (30 mg total) by mouth daily.   No facility-administered encounter  medications on file as of 05/20/2021.  Recent Relevant Labs: Lab Results  Component Value Date/Time   HGBA1C 8.9 (A) 05/16/2021 04:10 PM   HGBA1C 8.1 (A) 02/07/2021 03:34 PM   HGBA1C 10.2 (H) 09/09/2020 12:56 PM   HGBA1C 9.9 (H) 07/17/2020 10:25 AM   HGBA1C 7.4 (A) 02/06/2020 09:17 AM   HGBA1C 6.7 07/14/2018 10:09 AM   MICROALBUR 0.3 03/23/2014 10:27 AM   MICROALBUR 1.3 09/18/2013 09:28 AM    Kidney Function Lab Results  Component Value Date/Time   CREATININE 1.52 (H) 07/17/2020 10:25 AM   CREATININE 1.50 11/01/2019 09:17 AM   CREATININE 1.83 (H) 06/23/2019 08:50 AM   GFR 54.42 (L) 11/01/2019 09:17 AM   GFRNONAA 42 (L) 07/17/2020 10:25 AM   GFRAA 49 (L) 07/17/2020 10:25 AM    Current antihyperglycemic regimen:  Glipizide 10 mg - daily at breakfast Trulicity 3 mg inject once weekly - every Monday Pioglitazone 30 mg 1 tablet daily - taking the 15 mg tablets  Adherence Review: Is the patient currently on a STATIN medication? Yes Is the patient currently on ACE/ARB medication? No Does the patient have >5 day gap between last estimated fill dates? No  Notes: Attempted to reach patients wife she advised shed like a call back later this afternoon as she was at an appointment.  05/22/21 Call to patients wife advised her of the above medication changes during her husbands recent visit at the office, she advised she was unsure of his blood sugars  and he was not home at the moment to give a call back on tomorrow  06/03/21 call back patients wife reports I should speak to him with but he was unwilling to come to the phone.  Care Gaps: Zoster Vaccine - Overdue Foot Exam - Overdue COVID Booster #4 Moderna - Overdue Eye Exam -Overdue BP- 136/70 A1C- 8.9 AWV-MSG sent to [name] CMA to schedule. CCM- 08/05/21  Star Rating Drugs: Pioglitazone (Actos) 30 mg - Last filled 02/08/21 90 DS Lisinopril(Zestril) 40 mg - Last filled 03/27/21 90 DS Glipizide (Glucotrol) 10 mg  Duaglutide  (Trulicity) 3 mg - Last filled 03/26/21 84 DS Atorvastatin (Lipitor) 40 mg - Last filled 04/23/2021 [place] Pharmacist Assistant [phone]

## 2021-05-20 NOTE — Patient Instructions (Addendum)
It was a pleasure to see you today at our office.   Recommendations:  Continue donepezil 10 mg daily. Side effects were discussed  Continue Memantine 10 mg twice daily.Side effects were discussed  COntinue the mood medication citalopram 20 mg daily Follow up in 6 months     RECOMMENDATIONS FOR ALL PATIENTS WITH MEMORY PROBLEMS: 1. Continue to exercise (Recommend 30 minutes of walking everyday, or 3 hours every week) 2. Increase social interactions - continue going to [place] and enjoy social gatherings with friends and family 3. Eat healthy, avoid fried foods and eat more fruits and vegetables 4. Maintain adequate blood pressure, blood sugar, and blood cholesterol level. Reducing the risk of stroke and cardiovascular disease also helps promoting better memory. 5. Avoid stressful situations. Live a simple life and avoid aggravations. Organize your time and prepare for the next day in anticipation. 6. Sleep well, avoid any interruptions of sleep and avoid any distractions in the bedroom that may interfere with adequate sleep quality 7. Avoid sugar, avoid sweets as there is a strong link between excessive sugar intake, diabetes, and cognitive impairment We discussed the Mediterranean diet, which has been shown to help patients reduce the risk of progressive memory disorders and reduces cardiovascular risk. This includes eating fish, eat fruits and green leafy vegetables, nuts like almonds and hazelnuts, walnuts, and also use olive oil. Avoid fast foods and fried foods as much as possible. Avoid sweets and sugar as sugar use has been linked to worsening of memory function.  There is always a concern of gradual progression of memory problems. If this is the case, then we may need to adjust level of care according to patient needs. Support, both to the patient and caregiver, should then be put into place.    FALL PRECAUTIONS: Be cautious when walking. Scan the area for obstacles that may  increase the risk of trips and falls. When getting up in the mornings, sit up at the edge of the bed for a few minutes before getting out of bed. Consider elevating the bed at the head end to avoid drop of blood pressure when getting up. Walk always in a well-lit room (use night lights in the walls). Avoid area rugs or power cords from appliances in the middle of the walkways. Use a walker or a cane if necessary and consider physical therapy for balance exercise. Get your eyesight checked regularly.   HOME SAFETY: Consider the safety of the kitchen when operating appliances like stoves, microwave oven, and blender. Consider having supervision and share cooking responsibilities until no longer able to participate in those. Accidents with firearms and other hazards in the house should be identified and addressed as well.   ABILITY TO BE LEFT ALONE: If patient is unable to contact 911 operator, consider using LifeLine, or when the need is there, arrange for someone to stay with patients. Smoking is a fire hazard, consider supervision or cessation. Risk of wandering should be assessed by caregiver and if detected at any point, supervision and safe proof recommendations should be instituted   DRIVING: Regarding driving, in patients with progressive memory problems, driving will be impaired. We advise to have someone else do the driving if trouble finding directions or if minor accidents are reported. Independent driving assessment is available to determine safety of driving.   If you are interested in the driving assessment, you can contact the following:  [employer] in [name]  [place]  [facility] [phone] or [phone]    [place] refers to food and lifestyle choices that are based on the traditions of countries located on the [employer]. This  way of eating has been shown to help prevent certain conditions and improve outcomes for people who have chronic diseases, like kidney disease and heart disease. What are tips for following this plan? Lifestyle  Cook and eat meals together with your family, when possible. Drink enough fluid to keep your urine clear or pale yellow. Be physically active every day. This includes: Aerobic exercise like running or swimming. Leisure activities like gardening, walking, or housework. Get 7-8 hours of sleep each night. If recommended by your health care provider, drink red wine in moderation. This means 1 glass a day for nonpregnant women and 2 glasses a day for men. A glass of wine equals 5 oz (150 mL). Reading food labels  Check the serving size of packaged foods. For foods such as rice and pasta, the serving size refers to the amount of cooked product, not dry. Check the total fat in packaged foods. Avoid foods that have saturated fat or trans fats. Check the ingredients list for added sugars, such as corn syrup. Shopping  At the grocery store, buy most of your food from the areas near the walls of the store. This includes: Fresh fruits and vegetables (produce). Grains, beans, nuts, and seeds. Some of these may be available in unpackaged forms or large amounts (in bulk). Fresh seafood. Poultry and eggs. Low-fat dairy products. Buy whole ingredients instead of prepackaged foods. Buy fresh fruits and vegetables in-season from local farmers markets. Buy frozen fruits and vegetables in resealable bags. If you do not have access to quality fresh seafood, buy precooked frozen shrimp or canned fish, such as tuna, salmon, or sardines. Buy small amounts of raw or cooked vegetables, salads, or olives from the deli or salad bar at your store. Stock your pantry so you always have certain foods on hand, such as olive oil, canned tuna, canned tomatoes, rice, pasta, and beans. Cooking  Cook foods with  extra-virgin olive oil instead of using butter or other vegetable oils. Have meat as a side dish, and have vegetables or grains as your main dish. This means having meat in small portions or adding small amounts of meat to foods like pasta or stew. Use beans or vegetables instead of meat in common dishes like chili or lasagna. Experiment with different cooking methods. Try roasting or broiling vegetables instead of steaming or sauteing them. Add frozen vegetables to soups, stews, pasta, or rice. Add nuts or seeds for added healthy fat at each meal. You can add these to yogurt, salads, or vegetable dishes. Marinate fish or vegetables using olive oil, lemon juice, garlic, and fresh herbs. Meal planning  Plan to eat 1 vegetarian meal one day each week. Try to work up to 2 vegetarian meals, if possible. Eat seafood 2 or more times a week. Have healthy snacks readily available, such as: Vegetable sticks with hummus. Greek yogurt. Fruit and nut trail mix. Eat balanced meals throughout the week. This includes: Fruit: 2-3 servings a day Vegetables: 4-5 servings a day Low-fat dairy: 2 servings a day Fish, poultry, or lean meat: 1 serving a day Beans and legumes: 2 or more servings a week Nuts and seeds: 1-2 servings a day Whole grains: 6-8 servings a day Extra-virgin olive oil: 3-4 servings a day Limit red meat and sweets to  only a few servings a month What are my food choices? Mediterranean diet Recommended Grains: Whole-grain pasta. Brown rice. Bulgar wheat. Polenta. Couscous. Whole-wheat bread. [place]. Vegetables: Artichokes. Beets. Broccoli. Cabbage. Carrots. Eggplant. Green beans. Chard. Kale. Spinach. Onions. Leeks. Peas. Squash. Tomatoes. Peppers. Radishes. Fruits: Apples. Apricots. Avocado. Berries. Bananas. Cherries. Dates. Figs. Grapes. Lemons. Melon. Oranges. Peaches. Plums. Pomegranate. Meats and other protein foods: Beans. Almonds. Sunflower seeds. Pine nuts. Peanuts. King.  Salmon. Scallops. Shrimp. [place]. Tilapia. Clams. Oysters. Eggs. Dairy: Low-fat milk. Cheese. Greek yogurt. Beverages: Water. Red wine. Herbal tea. Fats and oils: Extra virgin olive oil. Avocado oil. Grape seed oil. Sweets and desserts: [place] yogurt with honey. Baked apples. Poached pears. Trail mix. Seasoning and other foods: Basil. Cilantro. Coriander. Cumin. Mint. Parsley. Sage. Rosemary. Tarragon. Garlic. Oregano. Thyme. Pepper. Balsalmic vinegar. Tahini. Hummus. Tomato sauce. Olives. Mushrooms. Limit these Grains: Prepackaged pasta or rice dishes. Prepackaged cereal with added sugar. Vegetables: Deep fried potatoes (french fries). Fruits: Fruit canned in syrup. Meats and other protein foods: Beef. Pork. Lamb. Poultry with skin. Hot dogs. Berniece Salines. Dairy: Ice cream. Sour cream. Whole milk. Beverages: Juice. Sugar-sweetened soft drinks. Beer. Liquor and spirits. Fats and oils: Butter. Canola oil. Vegetable oil. Beef fat (tallow). Lard. Sweets and desserts: Cookies. Cakes. Pies. Candy. Seasoning and other foods: Mayonnaise. Premade sauces and marinades. The items listed may not be a complete list. Talk with your dietitian about what dietary choices are right for you. Summary The Mediterranean diet includes both food and lifestyle choices. Eat a variety of fresh fruits and vegetables, beans, nuts, seeds, and whole grains. Limit the amount of red meat and sweets that you eat. Talk with your health care provider about whether it is safe for you to drink red wine in moderation. This means 1 glass a day for nonpregnant women and 2 glasses a day for men. A glass of wine equals 5 oz (150 mL). This information is not intended to replace advice given to you by your health care provider. Make sure you discuss any questions you have with your health care provider. Document Released: 03/19/2016 Document Revised: 04/21/2016 Document Reviewed: 03/19/2016 Elsevier Interactive Patient Education  2017 [employer].

## 2021-05-20 NOTE — Progress Notes (Signed)
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  Discussed the importance of regular daily schedule with inclusion of crossword puzzles to maintain brain function.  Monitor driving, wife is aware that if these becomes an issue, she will call earlier than his scheduled appointment, for further recommendations.   Continue to monitor mood, continue citalopram 20 mg daily, refill was written today.  IContinue physical activity, walking at least 30 minutes at least 3 times a week.  Naps should be scheduled and should be no longer than 60 minutes and should not occur after 2 PM.  Mediterranean diet is recommended  Follow up in 6 months  Continue donepezil 10 mg daily, and memantine 10 mg twice daily.  Refill written today  Case discussed with Dr. 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  MRI of the brain showed progressive atrophy, chronic microvascular disease, and chronic microhemorrhage in the thalamus and brainstem (typically hypertensive). He is on donepezil 10 mg daily, memantine 10 mg initially twice daily.  He is on citalopram 20 mg daily  for mood control.  He was last seen on 11/18/2020 for moderate dementia with behavioral disturbance.  He is accompanied by his wife, who helps supplement the history today.  MOCA score on March 2020 was 11/30 equal MMSE 17/30.  Today's MMSE is 20/30 with deficiencies in delayed recall, unable to spell world backwards orientation is normal..  He is on donepezil 10 mg  daily and memantine 10 mg twice daily.   Since his last visit, he reports having some good days and bad days, and his memory is essentially unchanged.  He still cannot remember very well the names of persons, or stories that they  have been told a few minutes earlier.  His mood is better with citalopram, he is tolerating well.  He continues to drive always accompanied by his wife, and only short distances.  He is attentive to the road according to her.  For activity, he likes to mow on his own grass, and watch TV.  He does not like to participate in activities outside the house.  He manages his own medications, although at times he forgets and takes the morning medication at night.  He does not like to take a pillbox.  His wife control the finances now.  He sleeps well, denies any vivid dreams, irritability, and he is independent of dressing.  He does not like to shower according to his wife, and he needs assistance to do so.   He denies any headaches, focal numbness, new tingling, or unilateral weakness.  He uses a cane to ambulate, and denies any falls.  He denies any headaches, or recent trauma.  He denies any vision changes, or double vision.  His appetite is good, he denies any trouble swallowing, he denies any constipation or diarrhea.  He denies any incontinence.  He likes to cook, and denies leaving the stove on leaving the stove on.       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 He states his memory is bad, he started noticing changes a couple of  years ago. He denies getting lost driving. He misses bills because "there is no money to pay them." He manages his own medications and denies missing doses, his wife agrees. He was reporting difficulty remembering what he read in the bible or magazine after he finishes reading. He also has difficulty remembering names. His wife states that yesterday the bank called her because he was there and could not remember his social security number. She reports he got lost last month when he made a  wrong turn. She reports other bills are paid but sometimes late, he has not paid taxes and car insurance from last year, he told her he will pay when ready. His wife asked to speak privately about her concerns, she is asking for a pill to "calm him down." She reports he is a "time bomb in the morning, I am his worst enemy." He would sit watching TV all day. Symptoms started last summer, he started sleeping upstairs because he was getting irritated with people. Their daughter lives with them. She denies any paranoia or hallucinations. Sleep is good. He is independent with dressing and bathing.   He denies any headaches, dizziness, diplopia, dysarthria/dysphagia, neck/back pain, focal weakness, bowel/bladder dysfunction, anosmia, or tremors. He has chronic left hand tingling since his stroke. He has had nasal discharge since throat surgery in 2017. He has been on Donepezil since 2017, no side effects. No family history of dementia. No history of significant head injuries or alcohol use.   I personally reviewed MRI brain in 2016 showing an acute punctate nonhemorrhagic infarct within the left paramedian superior midbrain and inferomedial thalamus, multiple remote bilateral lacunar infarcts, advanced chronic microvascular disease, multiple foci of remote hemorrhage   PREVIOUS MEDICATIONS: None  CURRENT MEDICATIONS:  Outpatient Encounter Medications as of 05/20/2021  Medication Sig   ACCU-CHEK AVIVA PLUS test strip TEST BLOOD SUGAR TWICE DAILY   atorvastatin (LIPITOR) 40 MG tablet TAKE 1 TABLET EVERY DAY   Blood Glucose Monitoring Suppl (ACCU-CHEK AVIVA PLUS) w/Device KIT USE AS DIRECTED   clopidogrel (PLAVIX) 75 MG tablet TAKE 1 TABLET EVERY DAY   cyanocobalamin 1000 MCG tablet Take 1,000 mcg by mouth daily.   Dulaglutide (TRULICITY) 3 MV/6.7MC SOPN INJECT 3MG (1 PEN) SUBCUTANEOUSLY EVERY WEEK   glipiZIDE (GLUCOTROL XL) 10 MG 24 hr tablet Take 1 tablet (10 mg total) by mouth daily with breakfast.    levocetirizine (XYZAL) 5 MG tablet TAKE ONE TABLET BY MOUTH EVERY EVENING.   levothyroxine (SYNTHROID) 50 MCG tablet TAKE 1 TABLET EVERY DAY   lisinopril (ZESTRIL) 40 MG tablet TAKE 1 TABLET EVERY DAY   ONETOUCH DELICA LANCETS 94B MISC 1 each by Other route See admin instructions. Check blood sugar twice daily   oxybutynin (DITROPAN XL) 10 MG 24 hr tablet Take 1 tablet (10 mg total) by mouth at bedtime.   pioglitazone (ACTOS) 30 MG tablet Take 1 tablet (30 mg total) by mouth daily.   [DISCONTINUED] citalopram (CELEXA) 20 MG tablet Take 1 tablet (20 mg total) by mouth daily.   [DISCONTINUED] donepezil (ARICEPT) 10 MG tablet Take 1 tablet (10 mg total) by mouth at bedtime.   [DISCONTINUED] memantine (NAMENDA) 10 MG tablet 1 TAB TWICE A DAY   citalopram (CELEXA) 20 MG tablet Take 1 tablet (20 mg total) by mouth daily.   donepezil (ARICEPT) 10 MG tablet Take 1 tablet (10 mg total) by mouth at bedtime.   memantine (NAMENDA) 10 MG tablet 1 TAB TWICE A DAY   No facility-administered encounter  medications on file as of 05/20/2021.     Objective:     PHYSICAL EXAMINATION:    VITALS:   Vitals:   05/20/21 1255  BP: (!) 161/83  Pulse: 96  Resp: 18  SpO2: 99%  Weight: 208 lb (94.3 kg)  Height: _0  (1.778 m)     GEN:  The patient appears stated age and is in NAD. HEENT:  Normocephalic, atraumatic.   Neurological examination:  Orientation: The patient is alert. Oriented to person, place, and date cannot spell world backwards, cannot draw.  Delayed recall 2/3 cranial nerves: There is good facial symmetry.The speech is fluent and clear.  Hearing is intact to conversational tone. Sensation: Sensation is intact to light touch throughout Motor: Strength is at least antigravity x4.  Movement examination: Tone: There is normal tone in the UE/LE Abnormal movements:  no tremor.  No myoclonus.  No asterixis.   Coordination:  There is no decremation with RAM's. Gait and Station: The patient  has no difficulty arising out of a deep-seated chair without the use of the hands. The patient's stride length is normal, but the pace is slow.  MMSE - Mini Mental State Exam 05/20/2021 07/31/2016  Orientation to time 4 4  Orientation to Place 5 5  Registration 3 3  Attention/ Calculation 0 1  Recall 2 0  Language- name 2 objects 1 2  Language- repeat 0 1  Language- follow 3 step command 3 2  Language- read & follow direction 1 1  Write a sentence 1 1  Copy design 0 1  Total score 20 21      Montreal Cognitive Assessment  10/12/2018  Visuospatial/ Executive (0/5) 1  Naming (0/3) 1  Attention: Read list of digits (0/2) 1  Attention: Read list of letters (0/1) 0  Attention: Serial 7 subtraction starting at 100 (0/3) 0  Language: Repeat phrase (0/2) 0  Language : Fluency (0/1) 0  Abstraction (0/2) 0  Delayed Recall (0/5) 1  Orientation (0/6) 6  Total 10  Adjusted Score (based on education) 11    Total time spent on today's visit was 20 minutes, including both face-to-face time and nonface-to-face time.  Time included that spent on review of records (prior notes available to me/labs/imaging if pertinent), discussing treatment and goals, answering patient's questions and coordinating care.  Cc:  [name], NP Sharene Butters, PA-C

## 2021-05-27 DIAGNOSIS — E1142 Type 2 diabetes mellitus with diabetic polyneuropathy: Secondary | ICD-10-CM | POA: Diagnosis not present

## 2021-05-27 DIAGNOSIS — B351 Tinea unguium: Secondary | ICD-10-CM | POA: Diagnosis not present

## 2021-05-27 DIAGNOSIS — L84 Corns and callosities: Secondary | ICD-10-CM | POA: Diagnosis not present

## 2021-05-27 DIAGNOSIS — M79676 Pain in unspecified toe(s): Secondary | ICD-10-CM | POA: Diagnosis not present

## 2021-07-31 ENCOUNTER — Telehealth: Payer: Self-pay | Admitting: Pharmacist

## 2021-07-31 NOTE — Chronic Care Management (AMB) (Signed)
Chronic Care Management °Pharmacy Assistant  ° °Name: Scott Vasquez  MRN: [id number] DOB: 03/02/1941 ° °07/31/21 APPOINTMENT REMINDER ° ° ° °Scott Vasquez was reminded to have all medications, supplements and any blood glucose and blood pressure readings available for review with Madeline Pryor, Pharm. D, for telephone visit on 08/05/21 at 10:30. ° ° °Care Gaps: °Zoster Vaccine - Overdue °Foot Exam - Overdue °COVID Booster #4 Moderna - Overdue °Eye Exam -Overdue °BP- 136/70 (05/16/21) °A1C- 8.9 °AWV-MSG sent to Robin Hayes CMA to schedule. °CCM- 08/05/21 ° °Star Rating Drug: °Pioglitazone (Actos) 30 mg - Last filled 05/20/21 90 DS °Lisinopril(Zestril) 40 mg - Last filled 06/25/21 90 DS °Glipizide (Glucotrol) 10 mg - Last filled 05/16/21 90 DS °Duaglutide (Trulicity) 3 mg - Last filled 05/06/21 84 DS °Atorvastatin (Lipitor) 40 mg - Last filled 04/23/2021 90 DS ° °Any gaps in medications fill history? °None ° ° ° °Medications: °Outpatient Encounter Medications as of 07/31/2021  °Medication Sig  ° ACCU-CHEK AVIVA PLUS test strip TEST BLOOD SUGAR TWICE DAILY  ° atorvastatin (LIPITOR) 40 MG tablet TAKE 1 TABLET EVERY DAY  ° Blood Glucose Monitoring Suppl (ACCU-CHEK AVIVA PLUS) w/Device KIT USE AS DIRECTED  ° citalopram (CELEXA) 20 MG tablet Take 1 tablet (20 mg total) by mouth daily.  ° clopidogrel (PLAVIX) 75 MG tablet TAKE 1 TABLET EVERY DAY  ° cyanocobalamin 1000 MCG tablet Take 1,000 mcg by mouth daily.  ° donepezil (ARICEPT) 10 MG tablet Take 1 tablet (10 mg total) by mouth at bedtime.  ° Dulaglutide (TRULICITY) 3 MG/0.5ML SOPN INJECT 3MG (1 PEN) SUBCUTANEOUSLY EVERY WEEK  ° glipiZIDE (GLUCOTROL XL) 10 MG 24 hr tablet Take 1 tablet (10 mg total) by mouth daily with breakfast.  ° levocetirizine (XYZAL) 5 MG tablet TAKE ONE TABLET BY MOUTH EVERY EVENING.  ° levothyroxine (SYNTHROID) 50 MCG tablet TAKE 1 TABLET EVERY DAY  ° lisinopril (ZESTRIL) 40 MG tablet TAKE 1 TABLET EVERY DAY  ° memantine (NAMENDA) 10 MG tablet 1 TAB  TWICE A DAY  ° ONETOUCH DELICA LANCETS 33G MISC 1 each by Other route See admin instructions. Check blood sugar twice daily  ° oxybutynin (DITROPAN XL) 10 MG 24 hr tablet Take 1 tablet (10 mg total) by mouth at bedtime.  ° pioglitazone (ACTOS) 30 MG tablet Take 1 tablet (30 mg total) by mouth daily.  ° °No facility-administered encounter medications on file as of 07/31/2021.  ° ° ° °Laresia Green CMA °Clinical Pharmacist Assistant °[phone] ° °

## 2021-08-04 NOTE — Progress Notes (Deleted)
Chronic Care Management Pharmacy Note  08/04/2021 Name:  Scott Vasquez MRN:  [id number] DOB:  1941-03-26  Summary: A1c is not at goal < 7 TSH not within target range Pt is still having a sense of urinary urgency  Recommendations/Changes made from today's visit: -Recommended keeping a log of BP readings to separate his from his wife's readings -Recommend increasing Trulicity to 4.5 mg per week -Recommend switching glimepiride to SGLT2 inhbitor -Recommend trial of Myrbetriq for bladder symptoms -Recommended for patient to take 30 mg of Actos as prescribed and 5 mg of glimepiride daily  Plan: DM follow up in 1 month   Subjective: Scott Vasquez is an 80 y.o. year old male who is a primary patient of [name], NP.  The CCM team was consulted for assistance with disease management and care coordination needs.    Engaged with patient by telephone for follow up visit in response to provider referral for pharmacy case management and/or care coordination services.   Consent to Services:  The patient was given information about Chronic Care Management services, agreed to services, and gave verbal consent prior to initiation of services.  Please see initial visit note for detailed documentation.   Patient Care Team: [name], NP as PCP - General (Family Medicine) Clent Jacks, MD as Consulting Physician (Ophthalmology) Cameron Sprang, MD as Consulting Physician (Neurology) Viona Gilmore, [facility] as Pharmacist (Pharmacist)  Recent office visits: 05/16/2021 [name], NP - Patient presented for Uncontrolled type 2 diabetes mellitus with hyperglycemia and other concerns. Prescribed Glipizide 10 mg daily with breakfast, & Oxybutynin Chloride 10 mg at bedtime. Stopped Glimepiride & Tamsulosin.  02-07-2021 [name], NP - Patient presented for Diabetes mellitus type 2 with complications and other concerns. No medication changes.  Recent consult visits: 05/20/21 [name] (Neurology) - Patient presented for Moderate dementia with behavioral disturbance. No medication changes.  05/27/21 Steffanie Rainwater- [facility]) Patient presented for foot care. No other details available.  03-11-2021 Steffanie Rainwater- [facility]) Patient presented for foot care. No other details available.  Hospital visits: None in previous 6 months   Objective:  Lab Results  Component Value Date   CREATININE 1.52 (H) 07/17/2020   BUN 15 07/17/2020   GFR 54.42 (L) 11/01/2019   GFRNONAA 42 (L) 07/17/2020   GFRAA 49 (L) 07/17/2020   NA 140 07/17/2020   K 4.9 07/17/2020   CALCIUM 9.7 07/17/2020   CO2 29 07/17/2020   GLUCOSE 236 (H) 07/17/2020    Lab Results  Component Value Date/Time   HGBA1C 8.9 (A) 05/16/2021 04:10 PM   HGBA1C 8.1 (A) 02/07/2021 03:34 PM   HGBA1C 10.2 (H) 09/09/2020 12:56 PM   HGBA1C 9.9 (H) 07/17/2020 10:25 AM   HGBA1C 7.4 (A) 02/06/2020 09:17 AM   HGBA1C 6.7 07/14/2018 10:09 AM   GFR 54.42 (L) 11/01/2019 09:17 AM   GFR 43.30 (L) 06/23/2019 08:50 AM   MICROALBUR 0.3 03/23/2014 10:27 AM   MICROALBUR 1.3 09/18/2013 09:28 AM    Last diabetic Eye exam:  Lab Results  Component Value Date/Time   HMDIABEYEEXA No Retinopathy 02/28/2020 12:00 AM    Last diabetic Foot exam:  Lab Results  Component Value Date/Time   HMDIABFOOTEX done 08/19/2012 12:00 AM     Lab Results  Component Value Date   CHOL 136 07/17/2020   HDL 47 07/17/2020   LDLCALC 74 07/17/2020   TRIG 73 07/17/2020   CHOLHDL 2.9 07/17/2020    Hepatic Function Latest Ref Rng &  Units 07/17/2020 06/23/2019 04/20/2018  Total Protein 6.1 - 8.1 g/dL 7.1 7.4 6.9  Albumin 3.5 - 5.2 g/dL - 4.2 4.0  AST 10 - 35 U/L _0 ALT 9 - 46 U/L _1 Alk Phosphatase 39 - 117 U/L - 117 118(H)  Total Bilirubin 0.2 - 1.2 mg/dL 0.3 0.3 0.5  Bilirubin, Direct 0.0 - 0.3 mg/dL - - 0.1    Lab Results  Component Value Date/Time   TSH 5.37 (H) 09/09/2020 12:56 PM   TSH 9.93 (H) 07/17/2020 10:25 AM    FREET4 0.78 02/22/2015 11:51 AM   FREET4 0.79 01/25/2015 03:35 PM    CBC Latest Ref Rng & Units 07/17/2020 06/23/2019 12/24/2017  WBC 3.8 - 10.8 Thousand/uL 7.4 5.6 6.1  Hemoglobin 13.2 - 17.1 g/dL 11.5(L) 11.4(L) 12.1(L)  Hematocrit 38.5 - 50.0 % 36.4(L) 35.4(L) 37.2(L)  Platelets 140 - 400 Thousand/uL 268 196.0 292.0    No results found for: VD25OH  Clinical ASCVD: Yes  The ASCVD Risk score (Arnett DK, et al., 2019) failed to calculate for the following reasons:   The 2019 ASCVD risk score is only valid for ages 34 to 65   The patient has a prior MI or stroke diagnosis    Depression screen [facility] 2/9 02/07/2021 12/22/2019 12/24/2017  Decreased Interest 0 0 0  Down, Depressed, Hopeless 0 0 0  PHQ - 2 Score 0 0 0  Some recent data might be hidden     Social History   Tobacco Use  Smoking Status Former   Packs/day: 0.50   Years: 20.00   Pack years: 10.00   Types: Cigarettes   Start date: 24   Quit date: 08/10/1958   Years since quitting: 63.0  Smokeless Tobacco Never   BP Readings from Last 3 Encounters:  05/20/21 (!) 161/83  05/16/21 136/70  02/07/21 122/80   Pulse Readings from Last 3 Encounters:  05/20/21 96  05/16/21 97  02/07/21 85   Wt Readings from Last 3 Encounters:  05/20/21 208 lb (94.3 kg)  05/16/21 206 lb (93.4 kg)  02/07/21 196 lb (88.9 kg)   BMI Readings from Last 3 Encounters:  05/20/21 29.84 kg/m  05/16/21 29.56 kg/m  02/07/21 28.12 kg/m    Assessment/Interventions: Review of patient past medical history, allergies, medications, health status, including review of consultants reports, laboratory and other test data, was performed as part of comprehensive evaluation and provision of chronic care management services.   SDOH:  (Social Determinants of Health) assessments and interventions performed: Yes   SDOH Screenings   Alcohol Screen: Not on file  Depression (PHQ2-9): Low Risk    PHQ-2 Score: 0  Financial Resource Strain: Low Risk     Difficulty of Paying Living Expenses: Not hard at all  Food Insecurity: Not on file  Housing: Not on file  Physical Activity: Not on file  Social Connections: Not on file  Stress: Not on file  Tobacco Use: Medium Risk   Smoking Tobacco Use: Former   Smokeless Tobacco Use: Never   Passive Exposure: Not on file  Transportation Needs: No Transportation Needs   Lack of Transportation (Medical): No   Lack of Transportation (Non-Medical): No   Spoke with patient's wife, who tries to help manage his medications. She puts them in a pillbox and does the ordering for them. She reports she knows what medications are and what they are for. She is struggling the most with getting the patient to take care of himself.  He does not shower often or have interest in keeping up with personal hygiene. He is also not active and sits around all day long either watching TV or laying in bed. Patient does not do any exercises. He doesn't have anywhere to go which is why he won't do anything. He wouldn't go to a senior center and they used to go to church but stopped when he got sick  Patient's wife denies any specific side effects with the medications but doesn't think the Flomax is helping. He has a sudden urgency to go to the bathroom and sometimes misses the toilet.  Patient drinks mostly diet Mt. Dew (2-3 cans per day) and does not drink water often. He also does not drink alcohol or smoke.  Patient's wife does the cooking and they eat out 2-3 times a week. The meals usually consist of a vegetable and a meat and do eat some fried chicken. Their son and daughter will bring food as well. He does eat a lot of desserts about 3 times a week.  CCM Care Plan  Allergies  Allergen Reactions   Actos [Pioglitazone]     Diarrhea    Metformin And Related     Diarrhea      Medications Reviewed Today     Reviewed by Reather Laurence, LPN (Licensed Practical Nurse) on 05/20/21 at 55  Med List Status: <None>    Medication Order Taking? Sig Documenting Provider Last Dose Status Informant  ACCU-CHEK AVIVA PLUS test strip [id number] Yes TEST BLOOD SUGAR TWICE DAILY Nafziger, Tommi Rumps, NP Taking Active   atorvastatin (LIPITOR) 40 MG tablet [id number] Yes TAKE 1 TABLET EVERY DAY Nafziger, Cory, NP Taking Active   Blood Glucose Monitoring Suppl (ACCU-CHEK AVIVA PLUS) w/Device KIT [id number] Yes USE AS DIRECTED Nafziger, Tommi Rumps, NP Taking Active   citalopram (CELEXA) 20 MG tablet [id number] Yes Take 1 tablet (20 mg total) by mouth daily. Rondel Jumbo, PA-C Taking Active   clopidogrel (PLAVIX) 75 MG tablet [id number] Yes TAKE 1 TABLET EVERY DAY Nafziger, Cory, NP Taking Active   cyanocobalamin 1000 MCG tablet [id number] Yes Take 1,000 mcg by mouth daily. [provider] Taking Active Spouse/Significant Other  donepezil (ARICEPT) 10 MG tablet [id number] Yes Take 1 tablet (10 mg total) by mouth at bedtime. Rondel Jumbo, PA-C Taking Active   Dulaglutide (TRULICITY) 3 WC/1.3SC Bonney Aid [id number] Yes INJECT $RemoveBe'3MG'[id number]$  (1 PEN) SUBCUTANEOUSLY EVERY WEEK Nafziger, Tommi Rumps, NP Taking Active   glipiZIDE (GLUCOTROL XL) 10 MG 24 hr tablet [id number] Yes Take 1 tablet (10 mg total) by mouth daily with breakfast. [name], NP Taking Active   levocetirizine (XYZAL) 5 MG tablet [id number] Yes TAKE ONE TABLET BY MOUTH EVERY EVENING. Nafziger, Tommi Rumps, NP Taking Active   levothyroxine (SYNTHROID) 50 MCG tablet [id number] Yes TAKE 1 TABLET EVERY DAY Nafziger, Cory, NP Taking Active   lisinopril (ZESTRIL) 40 MG tablet [id number] Yes TAKE 1 TABLET EVERY DAY Nafziger, Cory, NP Taking Active   memantine (NAMENDA) 10 MG tablet [id number] Yes 1 TAB TWICE A DAY Rondel Jumbo, PA-C Taking Active   [facility] DELICA LANCETS 92N MISC [id number] Yes 1 each by Other route See admin instructions. Check blood sugar twice daily [provider] Taking Active Spouse/Significant Other           Med Note Marlene Bast Apr 25, 2015  9:36 AM)     oxybutynin (DITROPAN XL) 10 MG 24 hr tablet [id number] Yes Take 1 tablet (10 mg total) by  mouth at bedtime. Nafziger, Tommi Rumps, NP Taking Active   pioglitazone (ACTOS) 30 MG tablet [id number] Yes Take 1 tablet (30 mg total) by mouth daily. 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   PERSONAL HX OF METHICILLIN RESIST STAPH AUREUS 09/17/2008   Hyperlipidemia 04/01/2007   B12 deficiency 02/08/2007   Iron deficiency anemia 02/08/2007   Essential hypertension 02/08/2007   DIVERTICULOSIS, COLON 02/08/2007   CEREBROVASCULAR ACCIDENT, HX OF 02/08/2007    Immunization History  Administered Date(s) Administered   Fluad Quad(high Dose 65+) 06/23/2019, 05/08/2020, 05/16/2021   Influenza Split 07/13/2011, 08/19/2012   Influenza Whole 08/15/2004, 06/10/2007, 05/17/2008, 05/03/2009, 05/02/2010   Influenza, High Dose Seasonal PF 06/07/2015, 05/29/2016, 05/11/2017, 07/14/2018   Influenza,inj,Quad PF,6+ Mos 05/26/2013, 04/20/2014   Moderna Sars-Covid-2 Vaccination 11/02/2019, 11/30/2019, 06/26/2020   Pneumococcal Conjugate-13 11/26/2014   Pneumococcal Polysaccharide-23 01/16/2011   Td 08/10/2004   Tdap 11/26/2014    Conditions to be addressed/monitored:  Hypertension, Hyperlipidemia, Diabetes, BPH, and History of stoke, dementia  Conditions addressed this visit: ***  There are no care plans that you recently modified to display for this patient.   Medication Assistance: None required.  Patient affirms current coverage meets needs.  Compliance/Adherence/Medication fill history: Care Gaps: Shingrix, foot exam, COVID booster, eye exam BP- 136/70 (05/16/21) A1C-  8.9  Star-Rating Drugs: Pioglitazone (Actos) 30 mg - Last filled 05/20/21 90 DS Lisinopril(Zestril) 40 mg - Last filled 06/25/21 90 DS Glipizide (Glucotrol) 10 mg - Last filled 05/16/21 90 DS Duaglutide (Trulicity) 3 mg - Last filled 05/06/21 84 DS Atorvastatin (Lipitor) 40 mg - Last filled 04/23/2021 90 DS  Patient's preferred pharmacy is:  [facility] [address] [place] [address] Phone: [phone] Fax: [phone]   Uses pill box? Yes Pt endorses 70% compliance  We discussed: Benefits of medication synchronization, packaging and delivery as well as enhanced pharmacist oversight with [employer]. Patient decided to: Continue current medication management strategy  Care Plan and Follow Up Patient Decision:  Patient agrees to Care Plan and Follow-up.  Plan: Telephone follow up appointment with care management team member scheduled for:  3 months  Jeni Salles, PharmD, [place] Pharmacist [place] at [place] [phone]

## 2021-08-05 ENCOUNTER — Telehealth: Payer: Medicare HMO

## 2021-08-05 NOTE — Progress Notes (Signed)
Per Jeni Salles, conflict with her schedule this morning need to push back appointment for this morning to later today, call to patient to advise was unable to reach on cell, left msg on home that the time of the call had been changed to 430 and to give me a call back if that was not a good time.     [place] Clinical Pharmacist Assistant [phone]

## 2021-08-19 ENCOUNTER — Encounter: Payer: Self-pay | Admitting: Adult Health

## 2021-08-19 ENCOUNTER — Ambulatory Visit (INDEPENDENT_AMBULATORY_CARE_PROVIDER_SITE_OTHER): Payer: Medicare HMO | Admitting: Adult Health

## 2021-08-19 VITALS — BP 160/88 | HR 71 | Temp 98.1°F | Ht 70.0 in | Wt 210.0 lb

## 2021-08-19 DIAGNOSIS — E785 Hyperlipidemia, unspecified: Secondary | ICD-10-CM

## 2021-08-19 DIAGNOSIS — I639 Cerebral infarction, unspecified: Secondary | ICD-10-CM | POA: Diagnosis not present

## 2021-08-19 DIAGNOSIS — E1122 Type 2 diabetes mellitus with diabetic chronic kidney disease: Secondary | ICD-10-CM | POA: Diagnosis not present

## 2021-08-19 DIAGNOSIS — E890 Postprocedural hypothyroidism: Secondary | ICD-10-CM

## 2021-08-19 DIAGNOSIS — N183 Chronic kidney disease, stage 3 unspecified: Secondary | ICD-10-CM | POA: Diagnosis not present

## 2021-08-19 DIAGNOSIS — N3281 Overactive bladder: Secondary | ICD-10-CM

## 2021-08-19 DIAGNOSIS — F03B18 Unspecified dementia, moderate, with other behavioral disturbance: Secondary | ICD-10-CM | POA: Diagnosis not present

## 2021-08-19 DIAGNOSIS — I10 Essential (primary) hypertension: Secondary | ICD-10-CM | POA: Diagnosis not present

## 2021-08-19 DIAGNOSIS — Z0000 Encounter for general adult medical examination without abnormal findings: Secondary | ICD-10-CM

## 2021-08-19 LAB — COMPREHENSIVE METABOLIC PANEL
ALT: 7 U/L (ref 0–53)
AST: 13 U/L (ref 0–37)
Albumin: 4.3 g/dL (ref 3.5–5.2)
Alkaline Phosphatase: 105 U/L (ref 39–117)
BUN: 27 mg/dL — ABNORMAL HIGH (ref 6–23)
CO2: 27 mEq/L (ref 19–32)
Calcium: 9.5 mg/dL (ref 8.4–10.5)
Chloride: 101 mEq/L (ref 96–112)
Creatinine, Ser: 1.55 mg/dL — ABNORMAL HIGH (ref 0.40–1.50)
GFR: 41.5 mL/min — ABNORMAL LOW (ref >60.00)
Glucose, Bld: 126 mg/dL — ABNORMAL HIGH (ref 70–99)
Potassium: 3.9 mEq/L (ref 3.5–5.1)
Sodium: 137 mEq/L (ref 135–145)
Total Bilirubin: 0.4 mg/dL (ref 0.2–1.2)
Total Protein: 7.8 g/dL (ref 6.0–8.3)

## 2021-08-19 LAB — LIPID PANEL
Cholesterol: 259 mg/dL — ABNORMAL HIGH (ref 0–200)
HDL: 65.5 mg/dL (ref >39.00)
LDL Cholesterol: 180 mg/dL — ABNORMAL HIGH (ref 0–99)
NonHDL: 193.81
Total CHOL/HDL Ratio: 4
Triglycerides: 68 mg/dL (ref 0.0–149.0)
VLDL: 13.6 mg/dL (ref 0.0–40.0)

## 2021-08-19 LAB — CBC WITH DIFFERENTIAL/PLATELET
Basophils Absolute: 0 10*3/uL (ref 0.0–0.1)
Basophils Relative: 0.3 % (ref 0.0–3.0)
Eosinophils Absolute: 0.1 10*3/uL (ref 0.0–0.7)
Eosinophils Relative: 2.4 % (ref 0.0–5.0)
HCT: 35.5 % — ABNORMAL LOW (ref 39.0–52.0)
Hemoglobin: 11.2 g/dL — ABNORMAL LOW (ref 13.0–17.0)
Lymphocytes Relative: 20.8 % (ref 12.0–46.0)
Lymphs Abs: 1.2 10*3/uL (ref 0.7–4.0)
MCHC: 31.7 g/dL (ref 30.0–36.0)
MCV: 90.7 fl (ref 78.0–100.0)
Monocytes Absolute: 0.6 10*3/uL (ref 0.1–1.0)
Monocytes Relative: 10.5 % (ref 3.0–12.0)
Neutro Abs: 3.9 10*3/uL (ref 1.4–7.7)
Neutrophils Relative %: 66 % (ref 43.0–77.0)
Platelets: 221 10*3/uL (ref 150.0–400.0)
RBC: 3.91 Mil/uL — ABNORMAL LOW (ref 4.22–5.81)
RDW: 15 % (ref 11.5–15.5)
WBC: 5.9 10*3/uL (ref 4.0–10.5)

## 2021-08-19 LAB — TSH: TSH: 7.45 u[IU]/mL — ABNORMAL HIGH (ref 0.35–5.50)

## 2021-08-19 LAB — HEMOGLOBIN A1C: Hgb A1c MFr Bld: 8.3 % — ABNORMAL HIGH (ref 4.6–6.5)

## 2021-08-19 MED ORDER — ATORVASTATIN CALCIUM 40 MG PO TABS: 40.0000 mg | ORAL_TABLET | Freq: Every day | ORAL | 3 refills | Status: DC

## 2021-08-19 MED ORDER — OXYBUTYNIN CHLORIDE ER 10 MG PO TB24: 10.0000 mg | ORAL_TABLET | Freq: Every day | ORAL | 0 refills | Status: DC

## 2021-08-19 MED ORDER — LISINOPRIL 40 MG PO TABS: 40.0000 mg | ORAL_TABLET | Freq: Every day | ORAL | 3 refills | Status: DC

## 2021-08-19 MED ORDER — LEVOTHYROXINE SODIUM 50 MCG PO TABS: 50.0000 ug | ORAL_TABLET | Freq: Every day | ORAL | 3 refills | Status: DC

## 2021-08-19 MED ORDER — PIOGLITAZONE HCL 30 MG PO TABS: 30.0000 mg | ORAL_TABLET | Freq: Every day | ORAL | 0 refills | Status: DC

## 2021-08-19 MED ORDER — CLOPIDOGREL BISULFATE 75 MG PO TABS: 75.0000 mg | ORAL_TABLET | Freq: Every day | ORAL | 3 refills | Status: DC

## 2021-08-19 MED ORDER — TRULICITY 3 MG/0.5ML ~~LOC~~ SOAJ: 3.0000 mg | SUBCUTANEOUS | 1 refills | Status: DC

## 2021-08-19 NOTE — Progress Notes (Signed)
Subjective:    Patient ID: Scott Vasquez, male    DOB: Jan 02, 1941, 81 y.o.   MRN: [id number]  HPI Patient presents for yearly preventative medicine examination. He is a pleasant 81 year old male who  has a past medical history of Anemia, Diabetes mellitus, Diverticulosis of colon, Hypertension, Stroke ([place]), and Thyroid nodule.  DM -he is currently prescribed Trulicity 3 mg weekly, Glipizide 10 mg XR daily, and Actos 30 mg daily.  He does check his blood sugar readings at home periodically and reports readings in the 110s to 150 range.  He denies any episodes of hypoglycemia. Has run out of his diabetes medications. Does not exercise much, leads a sedentary lifestyle  Lab Results  Component Value Date   HGBA1C 8.9 (A) 05/16/2021   Hyperlipidemia-is currently prescribed Lipitor 40 mg daily.  He denies myalgia or fatigue Lab Results  Component Value Date   CHOL 136 07/17/2020   HDL 47 07/17/2020   LDLCALC 74 07/17/2020   TRIG 73 07/17/2020   CHOLHDL 2.9 07/17/2020    OAB-takes Oxybutyin10 mg ER daily.  He denies symptoms  History of CVA-takes Plavix 75 mg daily.  No deficits noted  Moderate dementia-is followed by neurology every 6 months.  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  Managed with Aricept 10 mg daily and Namenda 10 mg daily.  He does feel as though his memory is stayed pretty stable.  He is on Celexa 20 mg daily for agitation.  Denies feeling depressed. Continues to drive, does not get lost.   Hypothyroidism-takes Synthroid 50 mcg daily Lab Results  Component Value Date   TSH 5.37 (H) 09/09/2020   Hypertension-takes lisinopril 40 mg daily.  He denies dizziness, lightheadedness, chest pain, shortness of breath. Has been out of medications  BP Readings from Last 3 Encounters:  08/19/21 (!) 160/88  05/20/21 (!) 161/83  05/16/21 136/70    All immunizations and health maintenance protocols were reviewed with the patient and needed orders were placed.  Appropriate  screening laboratory values were ordered for the patient including screening of hyperlipidemia, renal function and hepatic function.   Medication reconciliation,  past medical history, social history, problem list and allergies were reviewed in detail with the patient  Goals were established with regard to weight loss, exercise, and  diet in compliance with medications  He has no acute complaints today  Review of Systems  Constitutional: Negative.   HENT: Negative.    Eyes: Negative.   Respiratory: Negative.    Cardiovascular: Negative.   Gastrointestinal: Negative.   Endocrine: Negative.   Genitourinary: Negative.   Musculoskeletal: Negative.   Skin: Negative.   Allergic/Immunologic: Negative.   Neurological: Negative.   Hematological: Negative.   Psychiatric/Behavioral: Negative.    All other systems reviewed and are negative.  Past Medical History:  Diagnosis Date   Anemia    iron deficiency   Diabetes mellitus    type II   Diverticulosis of colon    Hypertension    Stroke (HCC)    numbness in left hand   Thyroid nodule     Social History   Socioeconomic History   Marital status: Married    Spouse name: Not on file   Number of children: Not on file   Years of education: Not on file   Highest education level: Not on file  Occupational History   Occupation: retired    Fish farm manager: [employer]   Smoking status: Former    Packs/day:  0.50    Years: 20.00    Pack years: 10.00    Types: Cigarettes    Start date: 2    Quit date: 08/10/1958    Years since quitting: 63.0   Smokeless tobacco: Never  Vaping Use   Vaping Use: Never used  Substance and Sexual Activity   Alcohol use: No   Drug use: No   Sexual activity: Not on file  Other Topics Concern   Not on file  Social History Narrative   Pt is R handed   Lives in single story home with his wife, [name]   2 adult children   8th grade education   Retired Theatre stage manager from [employer]   Married 43  years   Social Determinants of Radio broadcast assistant Strain: Low Risk    Difficulty of Paying Living Expenses: Not hard at all  Food Insecurity: Not on file  Transportation Needs: No Transportation Needs   Lack of Transportation (Medical): No   Lack of Transportation (Non-Medical): No  Physical Activity: Not on file  Stress: Not on file  Social Connections: Not on file  Intimate Partner Violence: Not on file    Past Surgical History:  Procedure Laterality Date   BACK SURGERY     CATARACT EXTRACTION, BILATERAL     SHOULDER SURGERY  08/2007   Supple   THYROID LOBECTOMY Left 04/29/2015   THYROID LOBECTOMY Left 04/29/2015   Procedure: THYROID LOBECTOMY;  Surgeon: Armandina Gemma, MD;  Location: [facility];  Service: General;  Laterality: Left;    Family History  Problem Relation Age of Onset   Diabetes Other    Diabetes Father        Died from diabetic coma in 1970/10/09   Hypertension Father    Stroke Mother        Died in 32   Hypertension Mother     Allergies  Allergen Reactions   Actos [Pioglitazone]     Diarrhea    Metformin And Related     Diarrhea      Current Outpatient Medications on File Prior to Visit  Medication Sig Dispense Refill   ACCU-CHEK AVIVA PLUS test strip TEST BLOOD SUGAR TWICE DAILY 200 strip 3   atorvastatin (LIPITOR) 40 MG tablet TAKE 1 TABLET EVERY DAY 90 tablet 3   Blood Glucose Monitoring Suppl (ACCU-CHEK AVIVA PLUS) w/Device KIT USE AS DIRECTED 1 kit 0   citalopram (CELEXA) 20 MG tablet Take 1 tablet (20 mg total) by mouth daily. 90 tablet 3   clopidogrel (PLAVIX) 75 MG tablet TAKE 1 TABLET EVERY DAY 90 tablet 3   cyanocobalamin 1000 MCG tablet Take 1,000 mcg by mouth daily.     donepezil (ARICEPT) 10 MG tablet Take 1 tablet (10 mg total) by mouth at bedtime. 90 tablet 3   Dulaglutide (TRULICITY) 3 CB/6.3AG SOPN INJECT 3MG (1 PEN) SUBCUTANEOUSLY EVERY WEEK 12 mL 2   glipiZIDE (GLUCOTROL XL) 10 MG 24 hr tablet Take 1 tablet (10 mg total) by  mouth daily with breakfast. 90 tablet 1   levocetirizine (XYZAL) 5 MG tablet TAKE ONE TABLET BY MOUTH EVERY EVENING. 30 tablet 5   levothyroxine (SYNTHROID) 50 MCG tablet TAKE 1 TABLET EVERY DAY 90 tablet 3   lisinopril (ZESTRIL) 40 MG tablet TAKE 1 TABLET EVERY DAY 90 tablet 3   memantine (NAMENDA) 10 MG tablet 1 TAB TWICE A DAY 180 tablet 3   ONETOUCH DELICA LANCETS 53M MISC 1 each by Other route See admin instructions.  Check blood sugar twice daily  0   oxybutynin (DITROPAN XL) 10 MG 24 hr tablet Take 1 tablet (10 mg total) by mouth at bedtime. 90 tablet 0   pioglitazone (ACTOS) 30 MG tablet Take 1 tablet (30 mg total) by mouth daily. 90 tablet 0   No current facility-administered medications on file prior to visit.    BP (!) 160/88    Pulse 71    Temp 98.1 F (36.7 C) (Oral)    Ht 5' 10"  (1.778 m)    Wt 210 lb (95.3 kg)    SpO2 99%    BMI 30.13 kg/m        Objective:   Physical Exam Vitals and nursing note reviewed.  Constitutional:      General: He is not in acute distress.    Appearance: Normal appearance. He is well-developed and normal weight.  HENT:     Head: Normocephalic and atraumatic.     Right Ear: Tympanic membrane, ear canal and external ear normal. There is no impacted cerumen.     Left Ear: Tympanic membrane, ear canal and external ear normal. There is no impacted cerumen.     Nose: Nose normal. No congestion or rhinorrhea.     Mouth/Throat:     Mouth: Mucous membranes are moist.     Pharynx: Oropharynx is clear. No oropharyngeal exudate or posterior oropharyngeal erythema.  Eyes:     General:        Right eye: No discharge.        Left eye: No discharge.     Extraocular Movements: Extraocular movements intact.     Conjunctiva/sclera: Conjunctivae normal.     Pupils: Pupils are equal, round, and reactive to light.  Neck:     Vascular: No carotid bruit.     Trachea: No tracheal deviation.  Cardiovascular:     Rate and Rhythm: Normal rate and regular  rhythm.     Pulses: Normal pulses.     Heart sounds: Normal heart sounds. No murmur heard.   No friction rub. No gallop.  Pulmonary:     Effort: Pulmonary effort is normal. No respiratory distress.     Breath sounds: Normal breath sounds. No stridor. No wheezing, rhonchi or rales.  Chest:     Chest wall: No tenderness.  Abdominal:     General: Bowel sounds are normal. There is no distension.     Palpations: Abdomen is soft. There is no mass.     Tenderness: There is no abdominal tenderness. There is no right CVA tenderness, left CVA tenderness, guarding or rebound.     Hernia: No hernia is present.  Musculoskeletal:        General: No swelling, tenderness, deformity or signs of injury. Normal range of motion.     Right lower leg: No edema.     Left lower leg: No edema.  Lymphadenopathy:     Cervical: No cervical adenopathy.  Skin:    General: Skin is warm and dry.     Capillary Refill: Capillary refill takes less than 2 seconds.     Coloration: Skin is not jaundiced or pale.     Findings: No bruising, erythema, lesion or rash.  Neurological:     General: No focal deficit present.     Mental Status: He is alert and oriented to person, place, and time.     Cranial Nerves: No cranial nerve deficit.     Sensory: No sensory deficit.     Motor: No  weakness.     Coordination: Coordination normal.     Gait: Gait normal.     Deep Tendon Reflexes: Reflexes normal.  Psychiatric:        Mood and Affect: Mood normal.        Behavior: Behavior normal.        Thought Content: Thought content normal.        Judgment: Judgment normal.      Assessment & Plan:  1. Routine general medical examination at a health care facility - Encouraged to exercise on a routine basis  - Work on heart healthy, diabetic diet.  - CBC with Differential/Platelet; Future - Comprehensive metabolic panel; Future - Hemoglobin A1c; Future - Lipid panel; Future - TSH; Future  2. Controlled type 2 diabetes  mellitus with stage 3 chronic kidney disease, without long-term current use of insulin ([place]) - Consider adding agent  - CBC with Differential/Platelet; Future - Comprehensive metabolic panel; Future - Hemoglobin A1c; Future - Lipid panel; Future - TSH; Future - pioglitazone (ACTOS) 30 MG tablet; Take 1 tablet (30 mg total) by mouth daily.  Dispense: 90 tablet; Refill: 0 - Dulaglutide (TRULICITY) 3 YP/9.5KD SOPN; Inject 3 mg as directed once a week.  Dispense: 6 mL; Refill: 1  3. Essential hypertension  - CBC with Differential/Platelet; Future - Comprehensive metabolic panel; Future - Hemoglobin A1c; Future - Lipid panel; Future - TSH; Future - lisinopril (ZESTRIL) 40 MG tablet; Take 1 tablet (40 mg total) by mouth daily.  Dispense: 90 tablet; Refill: 3  4. OAB (overactive bladder)  - CBC with Differential/Platelet; Future - Comprehensive metabolic panel; Future - Hemoglobin A1c; Future - Lipid panel; Future - TSH; Future - oxybutynin (DITROPAN XL) 10 MG 24 hr tablet; Take 1 tablet (10 mg total) by mouth at bedtime.  Dispense: 90 tablet; Refill: 0  5. Hyperlipidemia, unspecified hyperlipidemia type  - CBC with Differential/Platelet; Future - Comprehensive metabolic panel; Future - Hemoglobin A1c; Future - Lipid panel; Future - TSH; Future - atorvastatin (LIPITOR) 40 MG tablet; Take 1 tablet (40 mg total) by mouth daily.  Dispense: 90 tablet; Refill: 3  6. Moderate dementia with behavioral disturbance - Follow up with Neurology as directed - CBC with Differential/Platelet; Future - Comprehensive metabolic panel; Future - Hemoglobin A1c; Future - Lipid panel; Future - TSH; Future  7. Postoperative hypothyroidism - Consider increase in Synthroid  - CBC with Differential/Platelet; Future - Comprehensive metabolic panel; Future - Hemoglobin A1c; Future - Lipid panel; Future - TSH; Future - levothyroxine (SYNTHROID) 50 MCG tablet; Take 1 tablet (50 mcg total) by mouth  daily.  Dispense: 90 tablet; Refill: 3  8. Stroke with cerebral ischemia (HCC)  - CBC with Differential/Platelet; Future - Comprehensive metabolic panel; Future - Hemoglobin A1c; Future - Lipid panel; Future - TSH; Future - clopidogrel (PLAVIX) 75 MG tablet; Take 1 tablet (75 mg total) by mouth daily.  Dispense: 90 tablet; Refill: 3  [name], NP

## 2021-08-19 NOTE — Patient Instructions (Signed)
It was great seeing you today   We will follow up with you regarding your lab work   Please let me know if you need anything   I will see you back in three months  

## 2021-08-20 ENCOUNTER — Telehealth: Payer: Self-pay | Admitting: Adult Health

## 2021-08-20 NOTE — Telephone Encounter (Signed)
Pt wife call and stated she is returning your call and want a call back.

## 2021-08-22 NOTE — Telephone Encounter (Signed)
Please see lab result message.  

## 2021-08-26 DIAGNOSIS — B351 Tinea unguium: Secondary | ICD-10-CM | POA: Diagnosis not present

## 2021-08-26 DIAGNOSIS — E1142 Type 2 diabetes mellitus with diabetic polyneuropathy: Secondary | ICD-10-CM | POA: Diagnosis not present

## 2021-08-26 DIAGNOSIS — M79676 Pain in unspecified toe(s): Secondary | ICD-10-CM | POA: Diagnosis not present

## 2021-08-26 DIAGNOSIS — L84 Corns and callosities: Secondary | ICD-10-CM | POA: Diagnosis not present

## 2021-09-01 ENCOUNTER — Telehealth: Payer: Self-pay | Admitting: Adult Health

## 2021-09-01 NOTE — Telephone Encounter (Signed)
Caller called after hours line--states her husband needs help bathing. Wife needs an order from his primary care doctor in order to have someone to help him bathe,

## 2021-09-02 NOTE — Telephone Encounter (Signed)
Left a detailed message at the patient's home number with the questions as below.

## 2021-09-30 ENCOUNTER — Telehealth: Payer: Self-pay | Admitting: Pharmacist

## 2021-09-30 NOTE — Chronic Care Management (AMB) (Signed)
° ° °  Chronic Care Management Pharmacy Assistant   Name: Scott Vasquez  MRN: [id number] DOB: 08-24-1940  Reason for Encounter: Reschedule Missed Appointment with MP   Recent office visits:  08/19/21 [name], NP - Patient presented for routine general medical exam at a health care facility and other concerns. Changed Dulaglutide 3 mg.  Recent consult visits:  None  Hospital visits:  None in previous 6 months  Medications: Outpatient Encounter Medications as of 09/30/2021  Medication Sig   ACCU-CHEK AVIVA PLUS test strip TEST BLOOD SUGAR TWICE DAILY   atorvastatin (LIPITOR) 40 MG tablet Take 1 tablet (40 mg total) by mouth daily.   Blood Glucose Monitoring Suppl (ACCU-CHEK AVIVA PLUS) w/Device KIT USE AS DIRECTED   citalopram (CELEXA) 20 MG tablet Take 1 tablet (20 mg total) by mouth daily.   clopidogrel (PLAVIX) 75 MG tablet Take 1 tablet (75 mg total) by mouth daily.   cyanocobalamin 1000 MCG tablet Take 1,000 mcg by mouth daily.   donepezil (ARICEPT) 10 MG tablet Take 1 tablet (10 mg total) by mouth at bedtime.   Dulaglutide (TRULICITY) 3 JM/4.2AS SOPN Inject 3 mg as directed once a week.   glipiZIDE (GLUCOTROL XL) 10 MG 24 hr tablet Take 1 tablet (10 mg total) by mouth daily with breakfast.   levocetirizine (XYZAL) 5 MG tablet TAKE ONE TABLET BY MOUTH EVERY EVENING.   levothyroxine (SYNTHROID) 50 MCG tablet Take 1 tablet (50 mcg total) by mouth daily.   lisinopril (ZESTRIL) 40 MG tablet Take 1 tablet (40 mg total) by mouth daily.   memantine (NAMENDA) 10 MG tablet 1 TAB TWICE A DAY   ONETOUCH DELICA LANCETS 34H MISC 1 each by Other route See admin instructions. Check blood sugar twice daily   oxybutynin (DITROPAN XL) 10 MG 24 hr tablet Take 1 tablet (10 mg total) by mouth at bedtime.   pioglitazone (ACTOS) 30 MG tablet Take 1 tablet (30 mg total) by mouth daily.   No facility-administered encounter medications on file as of 09/30/2021.    Care Gaps: BP- 160/88 (  08/19/21) AWV- office aware to schedule as of 12/22 CCM- 5/23 Zoster Vaccine - Overdue COVID Booster - Overdue Eye Exam - Overdue Lab Results  Component Value Date   HGBA1C 8.3 (H) 08/19/2021    Star Rating Drugs: Pioglitazone (Actos) 30 mg - Last filled 08/20/21 90 DS at [facility] Lisinopril(Zestril) 40 mg - Last filled 09/01/21 90 DS at [facility] Glipizide (Glucotrol) 10 mg - Last filled 05/16/21 90 DS Verified as accurate  Duaglutide (Trulicity) 3 mg - Last filled 08/19/21 84 DS at [facility] Atorvastatin (Lipitor) 40 mg - Last filled 07/21/2021 90 DS at [place] Pharmacist Assistant [phone]

## 2021-10-01 DIAGNOSIS — Z0279 Encounter for issue of other medical certificate: Secondary | ICD-10-CM

## 2021-10-10 ENCOUNTER — Telehealth: Payer: Self-pay | Admitting: Physician Assistant

## 2021-10-10 NOTE — Telephone Encounter (Signed)
Per Kennis Carina direction called patient and left a voice mail to call back to confirm the following information. ? ?The SSA forms that were submitted for completion will not be completed. ? ?Does the patient want them returned? ? ?Clarise Cruz recommends patient see Anthoney Harada at [phone] as a provider that can evaluate and complete the forms. ? ? ?

## 2021-10-10 NOTE — Telephone Encounter (Signed)
Refund request sent to [facility] for processing, FYI only. ?

## 2021-10-14 NOTE — Telephone Encounter (Signed)
Left a message for patient to call back about the forms. Patient had called and left a voice mail. ?

## 2021-10-15 NOTE — Telephone Encounter (Signed)
Patient's wife returned call and is aware to reach out to [facility] office to arrange a consult. ? ?She requested the forms be mailed to the patient's home address on file. Forms sent accordingly. ?

## 2021-10-28 ENCOUNTER — Telehealth: Payer: Self-pay | Admitting: Physician Assistant

## 2021-10-28 NOTE — Telephone Encounter (Signed)
After confirming with [name] the refund was requested, I called and spoke with Mrs. Janis and let her know the refund is in process. ?

## 2021-10-28 NOTE — Telephone Encounter (Signed)
Patients wife called about her $25.00. stated she has not recvd it yet.  ?

## 2021-11-03 ENCOUNTER — Telehealth: Payer: Self-pay | Admitting: Pharmacist

## 2021-11-03 NOTE — Progress Notes (Signed)
? ? ?Chronic Care Management ?Pharmacy Assistant  ? ?Name: Scott Vasquez  MRN: [id number] DOB: [birth year] ? ?Reason for Encounter: Disease State ?  ?Conditions to be addressed/monitored: ?DMII ? ?Recent office visits:  ?08/19/21 [name], NP - Patient presented for Routine General medical examination at a health care facility and other concerns. No medication changes ? ?Recent consult visits:  ?08/26/21 Steffanie Rainwater (Podiatry) - Patient presented for Trim benign hyperkeratotic skin lesion and other concerns. No other visit details available. ? ?Hospital visits:  ?None in previous 6 months ? ?Medications: ?Outpatient Encounter Medications as of 11/03/2021  ?Medication Sig  ? ACCU-CHEK AVIVA PLUS test strip TEST BLOOD SUGAR TWICE DAILY  ? atorvastatin (LIPITOR) 40 MG tablet Take 1 tablet (40 mg total) by mouth daily.  ? Blood Glucose Monitoring Suppl (ACCU-CHEK AVIVA PLUS) w/Device KIT USE AS DIRECTED  ? citalopram (CELEXA) 20 MG tablet Take 1 tablet (20 mg total) by mouth daily.  ? clopidogrel (PLAVIX) 75 MG tablet Take 1 tablet (75 mg total) by mouth daily.  ? cyanocobalamin 1000 MCG tablet Take 1,000 mcg by mouth daily.  ? donepezil (ARICEPT) 10 MG tablet Take 1 tablet (10 mg total) by mouth at bedtime.  ? Dulaglutide (TRULICITY) 3 EN/2.7PO SOPN Inject 3 mg as directed once a week.  ? glipiZIDE (GLUCOTROL XL) 10 MG 24 hr tablet Take 1 tablet (10 mg total) by mouth daily with breakfast.  ? levocetirizine (XYZAL) 5 MG tablet TAKE ONE TABLET BY MOUTH EVERY EVENING.  ? levothyroxine (SYNTHROID) 50 MCG tablet Take 1 tablet (50 mcg total) by mouth daily.  ? lisinopril (ZESTRIL) 40 MG tablet Take 1 tablet (40 mg total) by mouth daily.  ? memantine (NAMENDA) 10 MG tablet 1 TAB TWICE A DAY  ? ONETOUCH DELICA LANCETS 24M MISC 1 each by Other route See admin instructions. Check blood sugar twice daily  ? oxybutynin (DITROPAN XL) 10 MG 24 hr tablet Take 1 tablet (10 mg total) by mouth at bedtime.  ? pioglitazone (ACTOS) 30 MG  tablet Take 1 tablet (30 mg total) by mouth daily.  ? ?No facility-administered encounter medications on file as of 11/03/2021.  ?Recent Relevant Labs: ?Lab Results  ?Component Value Date/Time  ? HGBA1C 8.3 (H) 08/19/2021 08:21 AM  ? HGBA1C 8.9 (A) 05/16/2021 04:10 PM  ? HGBA1C 8.1 (A) 02/07/2021 03:34 PM  ? HGBA1C 10.2 (H) 09/09/2020 12:56 PM  ? HGBA1C 7.4 (A) 02/06/2020 09:17 AM  ? HGBA1C 6.7 07/14/2018 10:09 AM  ? MICROALBUR 0.3 03/23/2014 10:27 AM  ? MICROALBUR 1.3 09/18/2013 09:28 AM  ?  ?Kidney Function ?Lab Results  ?Component Value Date/Time  ? CREATININE 1.55 (H) 08/19/2021 08:21 AM  ? CREATININE 1.52 (H) 07/17/2020 10:25 AM  ? CREATININE 1.50 11/01/2019 09:17 AM  ? GFR 41.50 (L) 08/19/2021 08:21 AM  ? GFRNONAA 42 (L) 07/17/2020 10:25 AM  ? GFRAA 49 (L) 07/17/2020 10:25 AM  ?Spoke to wife for call ? ?Current antihyperglycemic regimen:  ?Glimepiride 4 mg 1 tablet daily ?Glimepiride 1 mg 1 tablet daily - she has it but he isn't taking it ?Trulicity 3 mg inject once weekly - every Monday ?Pioglitazone 30 mg 1 tablet daily - taking the 15 mg tablets ?What recent interventions/DTPs have been made to improve glycemic control:  ?Wife reports no changes ?Have there been any recent hospitalizations or ED visits since last visit with CPP? No ?Patient denies hypoglycemic symptoms, including None ?Patient denies hyperglycemic symptoms, including none ?How often are you checking your  blood sugar? Wife reports she does not check for him he is checking she reports he has dementia and she thinks its moving quickly. She reports she recalls the last time he checked it was in the 80's ?She reports he is eating pretty good and still walking around the house every now and again. She reports she thinks he needs something to occupy his mind. She reports she and her son are trying to figure out some ways on how to do so as far as programming and activities. ? ?Adherence Review: ?Is the patient currently on a STATIN medication?  Yes ?Is the patient currently on ACE/ARB medication? No ?Does the patient have >5 day gap between last estimated fill dates? No ? ? ?Care Gaps: ?Zoster Vaccine - Overdue ?COVID Booster - Overdue ?Eye Exam - Overdue ?AWV- office aware to schedule as of 12/22 ?BP- 160/88 (08/19/21) ?CCM- 12/23/21 ? ?Star Rating Drugs: ?Atorvastatin 40 mg - Last filled 09/27/21 90 DS at [facility] ?Pioglitazone (Actos) 30 mg - Last filled 08/20/21 90 DS at [facility] ?Lisinopril(Zestril) 40 mg - Last filled 09/01/21 90 DS at [facility] ?Glipizide (Glucotrol) 10 mg - Last filled 05/16/21 90 DS Verified as accurate  ?Duaglutide (Trulicity) 3 mg - Last filled 08/19/21 84 DS at [facility] ? ? ?Ned Clines CMA ?Clinical Pharmacist Assistant ?[phone] ? ?

## 2021-11-11 ENCOUNTER — Ambulatory Visit: Payer: Medicare HMO | Admitting: Adult Health

## 2021-11-11 ENCOUNTER — Ambulatory Visit (INDEPENDENT_AMBULATORY_CARE_PROVIDER_SITE_OTHER): Payer: Medicare HMO | Admitting: Adult Health

## 2021-11-11 ENCOUNTER — Encounter: Payer: Self-pay | Admitting: Adult Health

## 2021-11-11 VITALS — BP 130/88 | HR 83 | Temp 99.1°F | Ht 70.0 in | Wt 209.0 lb

## 2021-11-11 DIAGNOSIS — I10 Essential (primary) hypertension: Secondary | ICD-10-CM | POA: Diagnosis not present

## 2021-11-11 DIAGNOSIS — E1165 Type 2 diabetes mellitus with hyperglycemia: Secondary | ICD-10-CM | POA: Diagnosis not present

## 2021-11-11 LAB — POCT GLYCOSYLATED HEMOGLOBIN (HGB A1C): Hemoglobin A1C: 11.2 % — AB (ref 4.0–5.6)

## 2021-11-11 MED ORDER — GLIPIZIDE ER 10 MG PO TB24: 10.0000 mg | ORAL_TABLET | Freq: Every day | ORAL | 1 refills | Status: DC

## 2021-11-11 MED ORDER — TRULICITY 3 MG/0.5ML ~~LOC~~ SOAJ: 3.0000 mg | SUBCUTANEOUS | 1 refills | Status: AC

## 2021-11-11 MED ORDER — PIOGLITAZONE HCL 30 MG PO TABS: 30.0000 mg | ORAL_TABLET | Freq: Every day | ORAL | 1 refills | Status: DC

## 2021-11-11 NOTE — Progress Notes (Signed)
? ?Subjective:  ? ? Patient ID: Scott Vasquez, male    DOB: [birth year], 81 y.o.   MRN: [id number] ? ?HPI ?81 year old male who  has a past medical history of Anemia, Diabetes mellitus, Diverticulosis of colon, Hypertension, Stroke ([place]), and Thyroid nodule. ? ?He presents to the office today for three month follow up regarding DM and HTN. His daughter is with him today.  ? ?DM Type 2 -he is currently prescribed Trulicity 3 mg weekly, glipizide 10 mg XR daily, and Actos 30 mg daily.  He does check his blood sugars at home and reports readings in the 130-150's. His mail order has not sent his trulicity in awhile.   He denies any episodes of hypoglycemia.  He does lead a pretty sedentary lifestyle, does not exercise nor watch his diet.  During his last visit he had run out of his diabetic medication. His last A1c was 8.3.  ?Lab Results  ?Component Value Date  ? HGBA1C 11.2 (A) 11/11/2021  ? ?Hypertension-takes lisinopril 40 mg daily.  He denies dizziness, lightheadedness, chest pain, shortness of breath. ?BP Readings from Last 3 Encounters:  ?11/11/21 130/88  ?08/19/21 (!) 160/88  ?05/20/21 (!) 161/83  ? ? ?Review of Systems ?See HPI  ? ?Past Medical History:  ?Diagnosis Date  ? Anemia   ? iron deficiency  ? Diabetes mellitus   ? type II  ? Diverticulosis of colon   ? Hypertension   ? Stroke [facility])   ? numbness in left hand  ? Thyroid nodule   ? ? ?Social History  ? ?Socioeconomic History  ? Marital status: Married  ?  Spouse name: Not on file  ? Number of children: Not on file  ? Years of education: Not on file  ? Highest education level: Not on file  ?Occupational History  ? Occupation: retired  ?  Employer: [id number]  ?Tobacco Use  ? Smoking status: Former  ?  Packs/day: 0.50  ?  Years: 20.00  ?  Pack years: 10.00  ?  Types: Cigarettes  ?  Start date: 53  ?  Quit date: 08/10/1958  ?  Years since quitting: 63.2  ? Smokeless tobacco: Never  ?Vaping Use  ? Vaping Use: Never used  ?Substance and Sexual Activity  ? Alcohol  use: No  ? Drug use: No  ? Sexual activity: Not on file  ?Other Topics Concern  ? Not on file  ?Social History Narrative  ? Pt is R handed  ? Lives in single story home with his wife, [name]  ? 2 adult children  ? 8th grade education  ? Retired Theatre stage manager from [employer]  ? Married 56 years  ? ?Social Determinants of Health  ? ?Financial Resource Strain: Low Risk   ? Difficulty of Paying Living Expenses: Not hard at all  ?Food Insecurity: Not on file  ?Transportation Needs: No Transportation Needs  ? Lack of Transportation (Medical): No  ? Lack of Transportation (Non-Medical): No  ?Physical Activity: Not on file  ?Stress: Not on file  ?Social Connections: Not on file  ?Intimate Partner Violence: Not on file  ? ? ?Past Surgical History:  ?Procedure Laterality Date  ? BACK SURGERY    ? CATARACT EXTRACTION, BILATERAL    ? SHOULDER SURGERY  08/2007  ? Supple  ? THYROID LOBECTOMY Left 04/29/2015  ? THYROID LOBECTOMY Left 04/29/2015  ? Procedure: THYROID LOBECTOMY;  Surgeon: Armandina Gemma, MD;  Location: [place];  Service: General;  Laterality: Left;  ? ? ?  Family History  ?Problem Relation Age of Onset  ? Diabetes Other   ? Diabetes Father   ?     Died from diabetic coma in 10-30-1970  ? Hypertension Father   ? Stroke Mother   ?     Died in 1970/10/30  ? Hypertension Mother   ? ? ?Allergies  ?Allergen Reactions  ? Actos [Pioglitazone]   ?  Diarrhea   ? Metformin And Related   ?  Diarrhea  ?  ? ? ?Current Outpatient Medications on File Prior to Visit  ?Medication Sig Dispense Refill  ? ACCU-CHEK AVIVA PLUS test strip TEST BLOOD SUGAR TWICE DAILY 200 strip 3  ? atorvastatin (LIPITOR) 40 MG tablet Take 1 tablet (40 mg total) by mouth daily. 90 tablet 3  ? Blood Glucose Monitoring Suppl (ACCU-CHEK AVIVA PLUS) w/Device KIT USE AS DIRECTED 1 kit 0  ? citalopram (CELEXA) 20 MG tablet Take 1 tablet (20 mg total) by mouth daily. 90 tablet 3  ? clopidogrel (PLAVIX) 75 MG tablet Take 1 tablet (75 mg total) by mouth daily. 90 tablet 3  ?  cyanocobalamin 1000 MCG tablet Take 1,000 mcg by mouth daily.    ? donepezil (ARICEPT) 10 MG tablet Take 1 tablet (10 mg total) by mouth at bedtime. 90 tablet 3  ? Dulaglutide (TRULICITY) 3 NW/2.9FA SOPN Inject 3 mg as directed once a week. 6 mL 1  ? glipiZIDE (GLUCOTROL XL) 10 MG 24 hr tablet Take 1 tablet (10 mg total) by mouth daily with breakfast. 90 tablet 1  ? levocetirizine (XYZAL) 5 MG tablet TAKE ONE TABLET BY MOUTH EVERY EVENING. 30 tablet 5  ? levothyroxine (SYNTHROID) 50 MCG tablet Take 1 tablet (50 mcg total) by mouth daily. 90 tablet 3  ? lisinopril (ZESTRIL) 40 MG tablet Take 1 tablet (40 mg total) by mouth daily. 90 tablet 3  ? memantine (NAMENDA) 10 MG tablet 1 TAB TWICE A DAY 180 tablet 3  ? ONETOUCH DELICA LANCETS 21H MISC 1 each by Other route See admin instructions. Check blood sugar twice daily  0  ? oxybutynin (DITROPAN XL) 10 MG 24 hr tablet Take 1 tablet (10 mg total) by mouth at bedtime. 90 tablet 0  ? pioglitazone (ACTOS) 30 MG tablet Take 1 tablet (30 mg total) by mouth daily. 90 tablet 0  ? ?No current facility-administered medications on file prior to visit.  ? ? ?BP 130/88   Pulse 83   Temp 99.1 ?F (37.3 ?C) (Oral)   Ht 5' 10"  (1.778 m)   Wt 209 lb (94.8 kg)   SpO2 95%   BMI 29.99 kg/m?  ? ? ?   ?Objective:  ? Physical Exam ?Vitals and nursing note reviewed.  ?Constitutional:   ?   Appearance: Normal appearance.  ?Cardiovascular:  ?   Rate and Rhythm: Normal rate and regular rhythm.  ?   Pulses: Normal pulses.  ?   Heart sounds: Normal heart sounds.  ?Pulmonary:  ?   Effort: Pulmonary effort is normal.  ?   Breath sounds: Normal breath sounds.  ?Skin: ?   General: Skin is warm and dry.  ?Neurological:  ?   General: No focal deficit present.  ?   Mental Status: He is alert and oriented to person, place, and time.  ?Psychiatric:     ?   Mood and Affect: Mood normal.     ?   Behavior: Behavior normal.     ?   Thought Content: Thought content normal.     ?  Judgment: Judgment  normal.  ? ? ?   ?Assessment & Plan:  ?1. Uncontrolled type 2 diabetes mellitus with hyperglycemia ([place]) ? ?- POC HgB A1c= 11.2 - has increased and not at goal. This is due to mail order pharmacy not sending the trulicity. Will send Trulicity to local pharmacy.  ?- needs to be more active and eat healthy  ?- Follow up in 3 months or sooner if needed ?- Dulaglutide (TRULICITY) 3 LK/9.1PH SOPN; Inject 3 mg as directed once a week.  Dispense: 6 mL; Refill: 1 ?- pioglitazone (ACTOS) 30 MG tablet; Take 1 tablet (30 mg total) by mouth daily.  Dispense: 90 tablet; Refill: 1 ?- glipiZIDE (GLUCOTROL XL) 10 MG 24 hr tablet; Take 1 tablet (10 mg total) by mouth daily with breakfast.  Dispense: 90 tablet; Refill: 1 ? ?2. Essential hypertension ?- Well controlled.  ?- No change in medications  ? ?[name], NP ? ? ? ?

## 2021-11-11 NOTE — Patient Instructions (Signed)
Health Maintenance Due  ?Topic Date Due  ? Zoster Vaccines- Shingrix (1 of 2) Never done  ? COVID-19 Vaccine (4 - Booster for Moderna series) 08/21/2020  ? OPHTHALMOLOGY EXAM  02/27/2021  ? ? ? ? Row Labels 08/19/2021  ?  7:48 AM 02/07/2021  ? 12:24 PM 12/22/2019  ?  3:53 PM  ?Depression screen PHQ 2/9   Section Header. No data exists in this row.     ?Decreased Interest   0 0 0  ?Down, Depressed, Hopeless   0 0 0  ?PHQ - 2 Score   0 0 0  ? ? ?

## 2021-11-12 ENCOUNTER — Telehealth: Payer: Self-pay | Admitting: Adult Health

## 2021-11-12 NOTE — Telephone Encounter (Signed)
Scott Vasquez is still working on the [employer]. Will call as soon as PPW is completed.  ?

## 2021-11-12 NOTE — Telephone Encounter (Signed)
PPW placed up front in front office filing. Pt spouse advised of update and will let Stacy know update.  ?

## 2021-11-12 NOTE — Telephone Encounter (Signed)
Pt was seen yesterday and wife is calling checking on the status of FMLA paperwork ?

## 2021-11-18 ENCOUNTER — Ambulatory Visit: Payer: Medicare HMO | Admitting: Physician Assistant

## 2021-11-18 ENCOUNTER — Encounter: Payer: Self-pay | Admitting: Physician Assistant

## 2021-11-18 VITALS — BP 156/83 | HR 100 | Resp 18 | Ht 71.0 in | Wt 206.0 lb

## 2021-11-18 DIAGNOSIS — F03B18 Unspecified dementia, moderate, with other behavioral disturbance: Secondary | ICD-10-CM | POA: Diagnosis not present

## 2021-11-18 NOTE — Progress Notes (Signed)
? ?Assessment/Plan:  ? 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 ?Discussed safety both in and out of the home.  ?Discussed the importance of regular daily schedule with inclusion of crossword puzzles to maintain brain function.  ?Recommend no further driving  ?Continue to monitor mood, continue citalopram 20 mg daily ?Continue physical activity, walking at least 30 minutes at least 3 times a week.  ?Naps should be scheduled and should be no longer than 60 minutes and should not occur after 2 PM.  ?Mediterranean diet is recommended  ?Follow up in 6 months  ?Continue donepezil 10 mg daily, and memantine 10 mg twice daily.  ? ?Case discussed with Dr. Tomi Likens who agrees with the plan ? ? ? ? ?Subjective:  ? ?Scott Vasquez with a history of hypertension, hyperlipidemia, diabetes, subcortical stroke in 2001 with residual left hand tingling with moderate dementia, likely vascular.  MRI of the brain showed progressive atrophy, chronic microvascular disease, and chronic microhemorrhage in the thalamus and brainstem (typically hypertensive). He is on donepezil 10 mg daily, memantine 10 mg initially twice daily.  He is on citalopram 20 mg daily  for mood control.  He was last seen on 05/20/2021 for moderate dementia with behavioral disturbance at which time MMSE was 20/30.  He is accompanied by his wife, who helps supplement the history today.  He is on donepezil 10 mg  daily and memantine 10 mg twice daily.   ?Since his last visit, he reports having some good days and bad days, and his memory is essentially unchanged.  He still cannot remember very well the names of persons, or stories that they have been told a few minutes earlier.  His mood is better with citalopram, he is tolerating well.  He continues to drive always accompanied by his wife, and only short distances but he now gets tired when driving, and has to pull the car off the road to rest.  He tries to stay active, but he shows less  interest in mowing the grass, or ambulating.  He does not like to use his cane.  He enjoys watching TV most of the day.  He does not like to participate in activities outside of the house.  His wife is in charge of the medications and finances.  He sleeps well, denies any vivid dreams, irritability.  His wife reports that they has been changing hygiene, and only wants to shower if he goes somewhere, otherwise has to be reminded.  He is independent of dressing, his wife sets up the close for him to do so.  He denies any headaches, focal numbness, new tingling, or unilateral weakness.  He uses a cane to ambulate, and denies any falls.  He denies any headaches, or recent trauma.  He denies any vision changes, or double vision.  His appetite is good, he denies any trouble swallowing, he does not cook.  He denies any constipation or diarrhea.  He has some incontinence needing hands, and has an appointment with urology soon.  ? ?  ?History on Initial Assessment 10/12/2018: This is a 81 year old right-handed man with a history of hypertension, hyperlipidemia, diabetes, subcortical stroke in 2001 with residual left hand tingling, presenting for evaluation of memory loss. He states his memory is bad, he started noticing changes a couple of years ago. He denies getting lost driving. He misses bills because "there is no money to pay them." He manages his own medications and denies missing doses, his wife agrees.  He was reporting difficulty remembering what he read in the bible or magazine after he finishes reading. He also has difficulty remembering names. His wife states that yesterday the bank called her because he was there and could not remember his social security number. She reports he got lost last month when he made a wrong turn. She reports other bills are paid but sometimes late, he has not paid taxes and car insurance from last year, he told her he will pay when ready. His wife asked to speak privately about her  concerns, she is asking for a pill to "calm him down." She reports he is a "time bomb in the morning, I am his worst enemy." He would sit watching TV all day. Symptoms started last summer, he started sleeping upstairs because he was getting irritated with people. Their daughter lives with them. She denies any paranoia or hallucinations. Sleep is good. He is independent with dressing and bathing. ?  ?He denies any headaches, dizziness, diplopia, dysarthria/dysphagia, neck/back pain, focal weakness, bowel/bladder dysfunction, anosmia, or tremors. He has chronic left hand tingling since his stroke. He has had nasal discharge since throat surgery in 2017. He has been on Donepezil since 2017, no side effects. No family history of dementia. No history of significant head injuries or alcohol use. ?  ?I personally reviewed MRI brain in 2016 showing an acute punctate nonhemorrhagic infarct within the left paramedian superior midbrain and inferomedial thalamus, multiple remote bilateral lacunar infarcts, advanced chronic microvascular disease, multiple foci of remote hemorrhage  ? ?PREVIOUS MEDICATIONS: None ? ?CURRENT MEDICATIONS:  ?Outpatient Encounter Medications as of 11/18/2021  ?Medication Sig  ? ACCU-CHEK AVIVA PLUS test strip TEST BLOOD SUGAR TWICE DAILY  ? atorvastatin (LIPITOR) 40 MG tablet Take 1 tablet (40 mg total) by mouth daily.  ? Blood Glucose Monitoring Suppl (ACCU-CHEK AVIVA PLUS) w/Device KIT USE AS DIRECTED  ? citalopram (CELEXA) 20 MG tablet Take 1 tablet (20 mg total) by mouth daily.  ? clopidogrel (PLAVIX) 75 MG tablet Take 1 tablet (75 mg total) by mouth daily.  ? cyanocobalamin 1000 MCG tablet Take 1,000 mcg by mouth daily.  ? donepezil (ARICEPT) 10 MG tablet Take 1 tablet (10 mg total) by mouth at bedtime.  ? Dulaglutide (TRULICITY) 3 XL/2.4MW SOPN Inject 3 mg as directed once a week.  ? glipiZIDE (GLUCOTROL XL) 10 MG 24 hr tablet Take 1 tablet (10 mg total) by mouth daily with breakfast.  ?  levocetirizine (XYZAL) 5 MG tablet TAKE ONE TABLET BY MOUTH EVERY EVENING.  ? levothyroxine (SYNTHROID) 50 MCG tablet Take 1 tablet (50 mcg total) by mouth daily.  ? lisinopril (ZESTRIL) 40 MG tablet Take 1 tablet (40 mg total) by mouth daily.  ? memantine (NAMENDA) 10 MG tablet 1 TAB TWICE A DAY  ? ONETOUCH DELICA LANCETS 10U MISC 1 each by Other route See admin instructions. Check blood sugar twice daily  ? oxybutynin (DITROPAN XL) 10 MG 24 hr tablet Take 1 tablet (10 mg total) by mouth at bedtime.  ? pioglitazone (ACTOS) 30 MG tablet Take 1 tablet (30 mg total) by mouth daily.  ? ?No facility-administered encounter medications on file as of 11/18/2021.  ? ? ? ?Objective:  ?  ? ?PHYSICAL EXAMINATION:   ? ?VITALS:   ?Vitals:  ? 11/18/21 1459  ?BP: (!) 156/83  ?Pulse: 100  ?Resp: 18  ?SpO2: 100%  ?Weight: 206 lb (93.4 kg)  ?Height: _0  (1.803 m)  ? ? ? ?GEN:  The patient  appears stated age and is in NAD. ?HEENT:  Normocephalic, atraumatic.  ? ?Neurological examination: ? ?Orientation: The patient is alert. Oriented to person, place, not to date. ?cranial nerves: There is good facial symmetry.The speech is fluent and clear.  Hearing is intact to conversational tone. ?Sensation: Sensation is intact to light touch throughout ?Motor: Strength is at least antigravity x4. ? ?Movement examination: ?Tone: There is normal tone in the UE/LE ?Abnormal movements:  no tremor.  No myoclonus.  No asterixis.   ?Coordination:  There is no decremation with RAM's. ?Gait and Station: The patient has no difficulty arising out of a deep-seated chair without the use of the hands. The patient's stride length is normal, but the pace is slow. ? ? ?  05/20/2021  ?  1:00 PM 07/31/2016  ?  8:56 AM  ?MMSE - Mini Mental State Exam  ?Orientation to time 4 4  ?Orientation to Place 5 5  ?Registration 3 3  ?Attention/ Calculation 0 1  ?Recall 2 0  ?Language- name 2 objects 1 2  ?Language- repeat 0 1  ?Language- follow 3 step command 3 2  ?Language-  read & follow direction 1 1  ?Write a sentence 1 1  ?Copy design 0 1  ?Total score 20 21  ?  ? ? ? ?  10/12/2018  ?  2:00 PM  ?Montreal Cognitive Assessment   ?Visuospatial/ Executive (0/5) 1  ?Naming (0/3) 1  ?[place]

## 2021-11-18 NOTE — Patient Instructions (Addendum)
? It was a pleasure to see you today at our office.  ? ?Recommendations: ? ?Continue donepezil 10 mg daily. Side effects were discussed  ?Continue Memantine 10 mg twice daily.Side effects were discussed  ?COntinue the mood medication citalopram 20 mg daily ?Highly recommend no further driving ?Increase your level of activity  ?Follow up in 6 months ? ? ? ? ?RECOMMENDATIONS FOR ALL PATIENTS WITH MEMORY PROBLEMS: ?1. Continue to exercise (Recommend 30 minutes of walking everyday, or 3 hours every week) ?2. Increase social interactions - continue going to [place] and enjoy social gatherings with friends and family ?3. Eat healthy, avoid fried foods and eat more fruits and vegetables ?4. Maintain adequate blood pressure, blood sugar, and blood cholesterol level. Reducing the risk of stroke and cardiovascular disease also helps promoting better memory. ?5. Avoid stressful situations. Live a simple life and avoid aggravations. Organize your time and prepare for the next day in anticipation. ?6. Sleep well, avoid any interruptions of sleep and avoid any distractions in the bedroom that may interfere with adequate sleep quality ?7. Avoid sugar, avoid sweets as there is a strong link between excessive sugar intake, diabetes, and cognitive impairment ?We discussed the Mediterranean diet, which has been shown to help patients reduce the risk of progressive memory disorders and reduces cardiovascular risk. This includes eating fish, eat fruits and green leafy vegetables, nuts like almonds and hazelnuts, walnuts, and also use olive oil. Avoid fast foods and fried foods as much as possible. Avoid sweets and sugar as sugar use has been linked to worsening of memory function. ? ?There is always a concern of gradual progression of memory problems. If this is the case, then we may need to adjust level of care according to patient needs. Support, both to the patient and caregiver, should then be put into place.  ? ? ?FALL PRECAUTIONS:  Be cautious when walking. Scan the area for obstacles that may increase the risk of trips and falls. When getting up in the mornings, sit up at the edge of the bed for a few minutes before getting out of bed. Consider elevating the bed at the head end to avoid drop of blood pressure when getting up. Walk always in a well-lit room (use night lights in the walls). Avoid area rugs or power cords from appliances in the middle of the walkways. Use a walker or a cane if necessary and consider physical therapy for balance exercise. Get your eyesight checked regularly. ? ? ?HOME SAFETY: Consider the safety of the kitchen when operating appliances like stoves, microwave oven, and blender. Consider having supervision and share cooking responsibilities until no longer able to participate in those. Accidents with firearms and other hazards in the house should be identified and addressed as well. ? ? ?ABILITY TO BE LEFT ALONE: If patient is unable to contact 911 operator, consider using LifeLine, or when the need is there, arrange for someone to stay with patients. Smoking is a fire hazard, consider supervision or cessation. Risk of wandering should be assessed by caregiver and if detected at any point, supervision and safe proof recommendations should be instituted ? ? ?DRIVING: Regarding driving, in patients with progressive memory problems, driving will be impaired. We advise to have someone else do the driving if trouble finding directions or if minor accidents are reported. Independent driving assessment is available to determine safety of driving. ? ? ?If you are interested in the driving assessment, you can contact the following: ? ?[facility] [employer] [facility]  in [place] ? ?[place] [phone] ? ?[facility] [phone] ? ?[facility] [phone] or [phone] ? ? ? ?Mediterranean Diet ?A Mediterranean diet refers to food and lifestyle choices that are based on the  traditions of countries located on the [employer]. This way of eating has been shown to help prevent certain conditions and improve outcomes for people who have chronic diseases, like kidney disease and heart disease. ?What are tips for following this plan? ?Lifestyle  ?Cook and eat meals together with your family, when possible. ?Drink enough fluid to keep your urine clear or pale yellow. ?Be physically active every day. This includes: ?Aerobic exercise like running or swimming. ?Leisure activities like gardening, walking, or housework. ?Get 7-8 hours of sleep each night. ?If recommended by your health care provider, drink red wine in moderation. This means 1 glass a day for nonpregnant women and 2 glasses a day for men. A glass of wine equals 5 oz (150 mL). ?Reading food labels  ?Check the serving size of packaged foods. For foods such as rice and pasta, the serving size refers to the amount of cooked product, not dry. ?Check the total fat in packaged foods. Avoid foods that have saturated fat or trans fats. ?Check the ingredients list for added sugars, such as corn syrup. ?Shopping  ?At the grocery store, buy most of your food from the areas near the walls of the store. This includes: ?Fresh fruits and vegetables (produce). ?Grains, beans, nuts, and seeds. Some of these may be available in unpackaged forms or large amounts (in bulk). ?Fresh seafood. ?Poultry and eggs. ?Low-fat dairy products. ?Buy whole ingredients instead of prepackaged foods. ?Buy fresh fruits and vegetables in-season from local farmers markets. ?Buy frozen fruits and vegetables in resealable bags. ?If you do not have access to quality fresh seafood, buy precooked frozen shrimp or canned fish, such as tuna, salmon, or sardines. ?Buy small amounts of raw or cooked vegetables, salads, or olives from the deli or salad bar at your store. ?Stock your pantry so you always have certain foods on hand, such as olive oil, canned tuna, canned  tomatoes, rice, pasta, and beans. ?Cooking  ?Cook foods with extra-virgin olive oil instead of using butter or other vegetable oils. ?Have meat as a side dish, and have vegetables or grains as your main dish. This means having meat in small portions or adding small amounts of meat to foods like pasta or stew. ?Use beans or vegetables instead of meat in common dishes like chili or lasagna. ?Experiment with different cooking methods. Try roasting or broiling vegetables instead of steaming or saut?eing them. ?Add frozen vegetables to soups, stews, pasta, or rice. ?Add nuts or seeds for added healthy fat at each meal. You can add these to yogurt, salads, or vegetable dishes. ?Marinate fish or vegetables using olive oil, lemon juice, garlic, and fresh herbs. ?Meal planning  ?Plan to eat 1 vegetarian meal one day each week. Try to work up to 2 vegetarian meals, if possible. ?Eat seafood 2 or more times a week. ?Have healthy snacks readily available, such as: ?Vegetable sticks with hummus. ?[place] yogurt. ?Fruit and nut trail mix. ?Eat balanced meals throughout the week. This includes: ?Fruit: 2-3 servings a day ?Vegetables: 4-5 servings a day ?Low-fat dairy: 2 servings a day ?Fish, poultry, or lean meat: 1 serving a day ?Beans and legumes: 2 or more servings a week ?Nuts and seeds: 1-2 servings a day ?Whole grains: 6-8 servings a day ?Extra-virgin olive  oil: 3-4 servings a day ?Limit red meat and sweets to only a few servings a month ?What are my food choices? ?Mediterranean diet ?Recommended ?Grains: Whole-grain pasta. Brown rice. Bulgar wheat. Polenta. Couscous. Whole-wheat bread. [place]. ?Vegetables: Artichokes. Beets. Broccoli. Cabbage. Carrots. Eggplant. Green beans. Chard. Kale. Spinach. Onions. Leeks. Peas. Squash. Tomatoes. Peppers. Radishes. ?Fruits: Apples. Apricots. Avocado. Berries. Bananas. Cherries. Dates. Figs. Grapes. Lemons. Melon. Oranges. Peaches. Plums. Pomegranate. ?Meats and other protein  foods: Beans. Almonds. Sunflower seeds. Pine nuts. Peanuts. [place]. Salmon. [place]. Shrimp. [place]. Tilapia. Clams. Oysters. Eggs. ?Dairy: Low-fat milk. Cheese. Greek yogurt. ?Beverages: Water. Red wine. Herbal

## 2021-11-20 ENCOUNTER — Telehealth: Payer: Self-pay | Admitting: Adult Health

## 2021-11-20 NOTE — Telephone Encounter (Signed)
[facility] [employer] Opinion of Patient's Capability to Manage Benefits form to be filled out--placed in dr's folder.  Call daughter Erline Levine at [phone] upon completion. ?

## 2021-11-21 NOTE — Telephone Encounter (Signed)
Noted. FYI 

## 2021-11-25 DIAGNOSIS — M79676 Pain in unspecified toe(s): Secondary | ICD-10-CM | POA: Diagnosis not present

## 2021-11-25 DIAGNOSIS — B351 Tinea unguium: Secondary | ICD-10-CM | POA: Diagnosis not present

## 2021-11-25 DIAGNOSIS — N401 Enlarged prostate with lower urinary tract symptoms: Secondary | ICD-10-CM | POA: Diagnosis not present

## 2021-11-25 DIAGNOSIS — N3941 Urge incontinence: Secondary | ICD-10-CM | POA: Diagnosis not present

## 2021-11-25 DIAGNOSIS — E1142 Type 2 diabetes mellitus with diabetic polyneuropathy: Secondary | ICD-10-CM | POA: Diagnosis not present

## 2021-11-25 DIAGNOSIS — R3915 Urgency of urination: Secondary | ICD-10-CM | POA: Diagnosis not present

## 2021-11-25 DIAGNOSIS — R351 Nocturia: Secondary | ICD-10-CM | POA: Diagnosis not present

## 2021-11-25 DIAGNOSIS — L84 Corns and callosities: Secondary | ICD-10-CM | POA: Diagnosis not present

## 2021-12-03 NOTE — Telephone Encounter (Signed)
Form completed. Pt daughter Erline Levine informed that form is ready for pick up. Form placed in front filing cabinet.  ?

## 2021-12-17 ENCOUNTER — Telehealth: Payer: Self-pay | Admitting: Pharmacist

## 2021-12-17 NOTE — Chronic Care Management (AMB) (Signed)
    Chronic Care Management Pharmacy Assistant   Name: Scott Vasquez  MRN: [id number] DOB: 1941-05-06  Reason for Encounter: Compliance List for HTN Medication per [name]   Recent office visits:  11/11/21 [name], NP - Patient presented for Uncontrolled type 2 diabetes mellitus with hyperglycemia and other concerns. No medication changes.  Recent consult visits:  11/18/21 [name] (Neurology) - Patient presented for Moderate dementia with behavioral disturbance. Recommended Mediterranean diet.  Hospital visits:  None in previous 6 months  Medications: Outpatient Encounter Medications as of 12/17/2021  Medication Sig   ACCU-CHEK AVIVA PLUS test strip TEST BLOOD SUGAR TWICE DAILY   atorvastatin (LIPITOR) 40 MG tablet Take 1 tablet (40 mg total) by mouth daily.   Blood Glucose Monitoring Suppl (ACCU-CHEK AVIVA PLUS) w/Device KIT USE AS DIRECTED   citalopram (CELEXA) 20 MG tablet Take 1 tablet (20 mg total) by mouth daily.   clopidogrel (PLAVIX) 75 MG tablet Take 1 tablet (75 mg total) by mouth daily.   cyanocobalamin 1000 MCG tablet Take 1,000 mcg by mouth daily.   donepezil (ARICEPT) 10 MG tablet Take 1 tablet (10 mg total) by mouth at bedtime.   Dulaglutide (TRULICITY) 3 UG/8.9VQ SOPN Inject 3 mg as directed once a week.   glipiZIDE (GLUCOTROL XL) 10 MG 24 hr tablet Take 1 tablet (10 mg total) by mouth daily with breakfast.   levocetirizine (XYZAL) 5 MG tablet TAKE ONE TABLET BY MOUTH EVERY EVENING.   levothyroxine (SYNTHROID) 50 MCG tablet Take 1 tablet (50 mcg total) by mouth daily.   lisinopril (ZESTRIL) 40 MG tablet Take 1 tablet (40 mg total) by mouth daily.   memantine (NAMENDA) 10 MG tablet 1 TAB TWICE A DAY   ONETOUCH DELICA LANCETS 94H MISC 1 each by Other route See admin instructions. Check blood sugar twice daily   oxybutynin (DITROPAN XL) 10 MG 24 hr tablet Take 1 tablet (10 mg total) by mouth at bedtime.   pioglitazone (ACTOS) 30 MG tablet Take 1 tablet (30 mg  total) by mouth daily.   No facility-administered encounter medications on file as of 12/17/2021.  Notes: Call to pharmacy to verify last fill date and QTY of Lisinopril 40 mg per [name] and Compliance list, was advised Last filled 12/02/21 90 DS Updated spreadsheet.  Care Gaps: Zoster Vaccine - Overdue COVID Booster - Overdue Eye Exam - Overdue BP- 156/83 ( 11/18/21) CCM- 5/23 AWV-office aware to schedule as of 12/22 Lab Results  Component Value Date   HGBA1C 11.2 (A) 11/11/2021    Star Rating Drugs: Atorvastatin 40 mg - Last filled 09/27/21 90 DS at [name] Pioglitazone (Actos) 30 mg - Last filled 11/11/21 90 DS at [name] Lisinopril(Zestril) 40 mg - Last filled 12/02/21 90 DS at [name] Glipizide (Glucotrol) 10 mg - Last filled 11/11/21 90 DS  Duaglutide (Trulicity) 3 mg - Last filled 11/11/21 84 DS at [name] Pharmacist Assistant [phone]

## 2021-12-22 ENCOUNTER — Telehealth: Payer: Self-pay | Admitting: Pharmacist

## 2021-12-22 NOTE — Chronic Care Management (AMB) (Signed)
    Chronic Care Management Pharmacy Assistant   Name: Scott Vasquez  MRN: [id number] DOB: 1941/07/14 12/22/21  APPOINTMENT REMINDER   Called Patient No answer, left message of appointment on 12/23/21  at 3 via telephone visit with Jeni Salles, Pharm D.   Notified to have all medications, supplements, blood pressure and/or blood sugar logs available during appointment and to return call if need to reschedule.   Care Gaps: Zoster Vaccine - Overdue COVID Booster - Overdue Eye Exam - Overdue BP- 130/88 ( 11/11/21) AWV-  office aware to schedule as of 12/22 Lab Results  Component Value Date   HGBA1C 11.2 (A) 11/11/2021    Star Rating Drug: Atorvastatin 40 mg - Last filled 12/12/21 90 DS at [facility] Pioglitazone (Actos) 30 mg - Last filled 11/11/21 90 DS at [facility] Lisinopril(Zestril) 40 mg - Last filled 12/01/21 90 DS at [facility] Glipizide (Glucotrol) 10 mg - Last filled 11/11/20 90 DS   Duaglutide (Trulicity) 3 mg - Last filled 11/11/21 84 DS at [facility]      Medications: Outpatient Encounter Medications as of 12/22/2021  Medication Sig   ACCU-CHEK AVIVA PLUS test strip TEST BLOOD SUGAR TWICE DAILY   atorvastatin (LIPITOR) 40 MG tablet Take 1 tablet (40 mg total) by mouth daily.   Blood Glucose Monitoring Suppl (ACCU-CHEK AVIVA PLUS) w/Device KIT USE AS DIRECTED   citalopram (CELEXA) 20 MG tablet Take 1 tablet (20 mg total) by mouth daily.   clopidogrel (PLAVIX) 75 MG tablet Take 1 tablet (75 mg total) by mouth daily.   cyanocobalamin 1000 MCG tablet Take 1,000 mcg by mouth daily.   donepezil (ARICEPT) 10 MG tablet Take 1 tablet (10 mg total) by mouth at bedtime.   Dulaglutide (TRULICITY) 3 LT/5.3UY SOPN Inject 3 mg as directed once a week.   glipiZIDE (GLUCOTROL XL) 10 MG 24 hr tablet Take 1 tablet (10 mg total) by mouth daily with breakfast.   levocetirizine (XYZAL) 5 MG tablet TAKE ONE TABLET BY MOUTH EVERY EVENING.   levothyroxine (SYNTHROID) 50 MCG tablet Take 1 tablet (50 mcg total) by  mouth daily.   lisinopril (ZESTRIL) 40 MG tablet Take 1 tablet (40 mg total) by mouth daily.   memantine (NAMENDA) 10 MG tablet 1 TAB TWICE A DAY   ONETOUCH DELICA LANCETS 23X MISC 1 each by Other route See admin instructions. Check blood sugar twice daily   oxybutynin (DITROPAN XL) 10 MG 24 hr tablet Take 1 tablet (10 mg total) by mouth at bedtime.   pioglitazone (ACTOS) 30 MG tablet Take 1 tablet (30 mg total) by mouth daily.   No facility-administered encounter medications on file as of 12/22/2021.      [place] Clinical Pharmacist Assistant [phone]

## 2021-12-23 ENCOUNTER — Ambulatory Visit (INDEPENDENT_AMBULATORY_CARE_PROVIDER_SITE_OTHER): Payer: Medicare HMO | Admitting: Pharmacist

## 2021-12-23 ENCOUNTER — Other Ambulatory Visit: Payer: Self-pay | Admitting: Adult Health

## 2021-12-23 DIAGNOSIS — E1165 Type 2 diabetes mellitus with hyperglycemia: Secondary | ICD-10-CM

## 2021-12-23 DIAGNOSIS — I10 Essential (primary) hypertension: Secondary | ICD-10-CM

## 2021-12-23 DIAGNOSIS — E785 Hyperlipidemia, unspecified: Secondary | ICD-10-CM

## 2021-12-23 NOTE — Progress Notes (Signed)
 Chronic Care Management Pharmacy Note  01/02/2022 Name:  Scott Vasquez MRN:  [id number] DOB:  07/05/1941  Summary: A1c not at goal < 8% TSH not within target range Pt is still having a sense of urinary urgency  Recommendations/Changes made from today's visit: -Recommended keeping a log of BP readings to separate his from his wife's readings -Recommend increasing Trulicity to 4.5 mg per week if A1c is not < 8% -Recommend repeat TSH and adjustment of levothyroxine dose   Plan: DM/BP follow up in 1 month Follow up in 3 months  Subjective: Scott Vasquez is an 80 y.o. year old male who is a primary patient of Nafziger, Cory, NP.  The CCM team was consulted for assistance with disease management and care coordination needs.    Engaged with patient by telephone for follow up visit in response to provider referral for pharmacy case management and/or care coordination services.   Consent to Services:  The patient was given information about Chronic Care Management services, agreed to services, and gave verbal consent prior to initiation of services.  Please see initial visit note for detailed documentation.   Patient Care Team: Nafziger, Cory, NP as PCP - General (Family Medicine) Groat, Robert, MD as Consulting Physician (Ophthalmology) Aquino, Karen M, MD as Consulting Physician (Neurology) , Madeline G, RPH as Pharmacist (Pharmacist)  Recent office visits: 11/11/21 Nafziger, Cory, NP - Patient presented for Uncontrolled type 2 diabetes mellitus with hyperglycemia and other concerns. No medication changes.  08/19/21 Nafziger, Cory, NP - Patient presented for Routine General medical examination at a health care facility and other concerns. No medication changes.  Recent consult visits: 11/18/21 Wertman, Sara E, PA-C (Neurology) - Patient presented for Moderate dementia with behavioral disturbance. Recommended Mediterranean diet.  08/26/21 Drake [name] (Podiatry) - Patient presented  for Trim benign hyperkeratotic skin lesion and other concerns. No other visit details available.  Hospital visits: None in previous 6 months   Objective:  Lab Results  Component Value Date   CREATININE 1.70 (H) 01/01/2022   BUN 20 01/01/2022   GFR 41.50 (L) 08/19/2021   GFRNONAA 40 (L) 01/01/2022   GFRAA 49 (L) 07/17/2020   NA 138 01/01/2022   K 4.1 01/01/2022   CALCIUM 9.1 01/01/2022   CO2 27 01/01/2022   GLUCOSE 130 (H) 01/01/2022    Lab Results  Component Value Date/Time   HGBA1C 11.2 (A) 11/11/2021 11:03 AM   HGBA1C 8.3 (H) 08/19/2021 08:21 AM   HGBA1C 8.9 (A) 05/16/2021 04:10 PM   HGBA1C 10.2 (H) 09/09/2020 12:56 PM   HGBA1C 7.4 (A) 02/06/2020 09:17 AM   HGBA1C 6.7 07/14/2018 10:09 AM   GFR 41.50 (L) 08/19/2021 08:21 AM   GFR 54.42 (L) 11/01/2019 09:17 AM   MICROALBUR 0.3 03/23/2014 10:27 AM   MICROALBUR 1.3 09/18/2013 09:28 AM    Last diabetic Eye exam:  Lab Results  Component Value Date/Time   HMDIABEYEEXA No Retinopathy 02/28/2020 12:00 AM    Last diabetic Foot exam:  Lab Results  Component Value Date/Time   HMDIABFOOTEX done 08/19/2012 12:00 AM     Lab Results  Component Value Date   CHOL 154 01/02/2022   HDL 57 01/02/2022   LDLCALC 85 01/02/2022   TRIG 61 01/02/2022   CHOLHDL 2.7 01/02/2022       Latest Ref Rng & Units 01/01/2022    1:55 PM 08/19/2021    8:21 AM 07/17/2020   10:25 AM  Hepatic Function  Total Protein 6.5 - 8.1 g/dL   6.9   7.8   7.1    Albumin 3.5 - 5.0 g/dL 3.7   4.3     AST 15 - 41 U/L _0 ALT 0 - 44 U/L _1 Alk Phosphatase 38 - 126 U/L 114   105     Total Bilirubin 0.3 - 1.2 mg/dL 0.1   0.4   0.3      Lab Results  Component Value Date/Time   TSH 10.167 (H) 01/02/2022 04:26 AM   TSH 7.45 (H) 08/19/2021 08:21 AM   TSH 5.37 (H) 09/09/2020 12:56 PM   FREET4 0.78 02/22/2015 11:51 AM   FREET4 0.79 01/25/2015 03:35 PM       Latest Ref Rng & Units 01/01/2022    1:55 PM 08/19/2021    8:21 AM 07/17/2020    10:25 AM  CBC  WBC 4.0 - 10.5 K/uL 4.7   5.9   7.4    Hemoglobin 13.0 - 17.0 g/dL 10.9   11.2   11.5    Hematocrit 39.0 - 52.0 % 34.7   35.5   36.4    Platelets 150 - 400 K/uL 243   221.0   268      No results found for: VD25OH  Clinical ASCVD: Yes  The ASCVD Risk score (Arnett DK, et al., 2019) failed to calculate for the following reasons:   The 2019 ASCVD risk score is only valid for ages 64 to 17   The patient has a prior MI or stroke diagnosis       08/19/2021    7:48 AM 02/07/2021   12:24 PM 12/22/2019    3:53 PM  Depression screen PHQ 2/9  Decreased Interest 0 0 0  Down, Depressed, Hopeless 0 0 0  PHQ - 2 Score 0 0 0     Social History   Tobacco Use  Smoking Status Former   Packs/day: 0.50   Years: 20.00   Pack years: 10.00   Types: Cigarettes   Start date: 15   Quit date: 08/10/1958   Years since quitting: 63.4  Smokeless Tobacco Never   BP Readings from Last 3 Encounters:  01/02/22 (!) 146/81  11/18/21 (!) 156/83  11/11/21 130/88   Pulse Readings from Last 3 Encounters:  01/02/22 65  11/18/21 100  11/11/21 83   Wt Readings from Last 3 Encounters:  01/02/22 207 lb 3.7 oz (94 kg)  11/18/21 206 lb (93.4 kg)  11/11/21 209 lb (94.8 kg)   BMI Readings from Last 3 Encounters:  01/02/22 29.31 kg/m  11/18/21 28.73 kg/m  11/11/21 29.99 kg/m    Assessment/Interventions: Review of patient past medical history, allergies, medications, health status, including review of consultants reports, laboratory and other test data, was performed as part of comprehensive evaluation and provision of chronic care management services.   SDOH:  (Social Determinants of Health) assessments and interventions performed: Yes   SDOH Screenings   Alcohol Screen: Not on file  Depression (PHQ2-9): Low Risk    PHQ-2 Score: 0  Financial Resource Strain: Low Risk    Difficulty of Paying Living Expenses: Not hard at all  Food Insecurity: Not on file  Housing: Not on file   Physical Activity: Not on file  Social Connections: Not on file  Stress: Not on file  Tobacco Use: Medium Risk   Smoking Tobacco Use: Former   Smokeless Tobacco Use: Never   Passive Exposure:  Not on file  Transportation Needs: No Transportation Needs   Lack of Transportation (Medical): No   Lack of Transportation (Non-Medical): No     CCM Care Plan  Allergies  Allergen Reactions   Actos [Pioglitazone] Other (See Comments)    Diarrhea    Metformin And Related Other (See Comments)    Diarrhea      Medications Reviewed Today     Reviewed by Burnett Harry, CPhT (Pharmacy Technician) on 01/02/22 at [place] List Status: Complete   Medication Order Taking? Sig Documenting Provider Last Dose Status Informant  ACCU-CHEK AVIVA PLUS test strip [id number]  TEST BLOOD SUGAR TWICE DAILY Nafziger, Tommi Rumps, NP  Active Spouse/Significant Other, Pharmacy Records  atorvastatin (LIPITOR) 40 MG tablet [id number] Yes Take 1 tablet (40 mg total) by mouth daily. Nafziger, Tommi Rumps, NP Tomasita Crumble Active Spouse/Significant Other, Pharmacy Records  Blood Glucose Monitoring Suppl (ACCU-CHEK AVIVA PLUS) w/Device Drucie Opitz [id number]  USE AS DIRECTED [name], NP  Active Spouse/Significant Other, Pharmacy Records  citalopram (CELEXA) 20 MG tablet [id number] Yes Take 1 tablet (20 mg total) by mouth daily. Rondel Jumbo, PA-C UNK Active Spouse/Significant Other, Pharmacy Records  clopidogrel (PLAVIX) 75 MG tablet [id number] Yes Take 1 tablet (75 mg total) by mouth daily. [name], NP UNK Active Spouse/Significant Other, Pharmacy Records  cyanocobalamin 1000 MCG tablet [id number]  Take 1,000 mcg by mouth daily. [provider]  Active Spouse/Significant Other, Pharmacy Records  donepezil (ARICEPT) 10 MG tablet [id number] Yes Take 1 tablet (10 mg total) by mouth at bedtime. Rondel Jumbo, PA-C UNK Active Spouse/Significant Other, Pharmacy Records  Dulaglutide (TRULICITY) 3 JQ/3.0SP SOPN [id number] Yes Inject 3  mg as directed once a week.  Patient taking differently: Inject 3 mg as directed every Monday.   Nafziger, Tommi Rumps, NP UNK Active Spouse/Significant Other, Pharmacy Records  glipiZIDE (GLUCOTROL XL) 10 MG 24 hr tablet [id number] Yes Take 1 tablet (10 mg total) by mouth daily with breakfast. [name], NP Tomasita Crumble Active Spouse/Significant Other, Pharmacy Records  levocetirizine (XYZAL) 5 MG tablet [id number] No TAKE ONE TABLET BY MOUTH EVERY EVENING.  Patient not taking: Reported on 01/02/2022   [name], NP Not Taking Active Spouse/Significant Other, Pharmacy Records  levothyroxine (SYNTHROID) 50 MCG tablet [id number] Yes Take 1 tablet (50 mcg total) by mouth daily.  Patient taking differently: Take 50 mcg by mouth daily before breakfast.   [name], NP Tomasita Crumble Active Spouse/Significant Other, Pharmacy Records  lisinopril (ZESTRIL) 40 MG tablet [id number] Yes Take 1 tablet (40 mg total) by mouth daily. Nafziger, Tommi Rumps, NP UNK Active Spouse/Significant Other, Pharmacy Records  memantine [facility]) 10 MG tablet [id number] Yes 1 TAB TWICE A DAY  Patient taking differently: Take 10 mg by mouth 2 (two) times daily.   Rondel Jumbo, PA-C UNK Active Spouse/Significant Other, Pharmacy Records  oxybutynin (DITROPAN XL) 10 MG 24 hr tablet [id number] Yes Take 1 tablet (10 mg total) by mouth at bedtime. [name], NP Tomasita Crumble Active Spouse/Significant Other, Pharmacy Records           Med Note Nash Mantis, TIFFANI S   Fri Jan 02, 2022  7:27 AM) Wife is unsure if the patient is taking both oxybutynin and vibegron.  pioglitazone (ACTOS) 30 MG tablet [id number] Yes Take 1 tablet (30 mg total) by mouth daily. Nafziger, Tommi Rumps, NP UNK Active Spouse/Significant Other, Pharmacy Records  Vibegron [facility]) 75 MG TABS [id number] Yes Take 75 mg by mouth daily. [provider] Mellissa Kohut Spouse/Significant Other, Pharmacy Records  Med Note (BERGER, TIFFANI S   Fri Jan 02, 2022  7:27 AM) Wife is unsure if the  patient is taking both oxybutynin and vibegron.            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/17/2008   Hyperlipidemia 04/01/2007   B12 deficiency 02/08/2007   Iron deficiency anemia 02/08/2007   Essential hypertension 02/08/2007   DIVERTICULOSIS, COLON 02/08/2007   CEREBROVASCULAR ACCIDENT, HX OF 02/08/2007    Immunization History  Administered Date(s) Administered   Fluad Quad(high Dose 65+) 06/23/2019, 05/08/2020, 05/16/2021   Influenza Split 07/13/2011, 08/19/2012   Influenza Whole 08/15/2004, 06/10/2007, 05/17/2008, 05/03/2009, 05/02/2010   Influenza, High Dose Seasonal PF 06/07/2015, 05/29/2016, 05/11/2017, 07/14/2018   Influenza,inj,Quad PF,6+ Mos 05/26/2013, 04/20/2014   Moderna Sars-Covid-2 Vaccination 11/02/2019, 11/30/2019, 06/26/2020   Pneumococcal Conjugate-13 11/26/2014   Pneumococcal Polysaccharide-23 01/16/2011   Td 08/10/2004   Tdap 11/26/2014   Patient had stopped taking the Trulicity for a while which is why his A1c went up at the last visit. Patient's wife reports he did not run out but simply did not want to take it.  Patient has a pillbox and he won't use it. Patient's wife will try to see if she can get him to use it.  Patient is not bathing or taking medication. Patient doesn't complain about headaches or chest pain. Patient walked up the street a week ago and mowed the lawn.  Patient's wife reports he is eating better. Patient is still taking the oxybutynin.  Patient will try to work on getting him to  take his medications.   Conditions to be addressed/monitored:  Hypertension, Hyperlipidemia, Diabetes, BPH, and History of stoke, dementia  Conditions addressed this visit: Hypertension, diabetes  Care Plan : CCM Pharmacy Care Plan  Updates made by , Madeline G, RPH since 01/02/2022 12:00 AM     Problem: Problem: Hypertension, Hyperlipidemia, Diabetes, BPH, and History of stoke, dementia      Long-Range Goal: Patient-Specific Goal   Start Date: 04/29/2021  Expected End Date: 04/29/2022  Recent Progress: On track  Priority: High  Note:   Current Barriers:  Unable to independently monitor therapeutic efficacy Unable to achieve control of diabetes   Pharmacist Clinical Goal(s):  Patient will achieve adherence to monitoring guidelines and medication adherence to achieve therapeutic efficacy achieve control of diabetes as evidenced by A1c adhere to prescribed medication regimen as evidenced by fill history  through collaboration with PharmD and provider.   Interventions: 1:1 collaboration with Nafziger, Cory, NP regarding development and update of comprehensive plan of care as evidenced by provider attestation and co-signature Inter-disciplinary care team collaboration (see longitudinal plan of care) Comprehensive medication review performed; medication list updated in electronic medical record  Hypertension (BP goal <140/90) -Not ideally controlled -Current treatment: Lisinopril 40 mg 1 tablet daily - Appropriate, Query effective, Safe, Accessible -Medications previously tried: n/a  -Current home readings: checks but unsure of readings as wife uses the same cuff -Current dietary habits: tries to limit salt intake -Current exercise habits: non -Denies hypotensive/hypertensive symptoms -  Educated on BP goals and benefits of medications for prevention of heart attack, stroke and kidney damage; Daily salt intake goal < 2300 mg; Importance of home blood pressure  monitoring; Proper BP monitoring technique; -Counseled to monitor BP at home at least weekly, document, and provide log at future appointments -Counseled on diet and exercise extensively Recommended to continue current medication Recommended keeping a log of BP readings to separate his from his wife's readings.  Hyperlipidemia: (LDL goal < 70) -Not ideally controlled -Current treatment: Atorvastatin 40 mg 1 tablet daily - Appropriate, Query effective, Safe, Accessible -Medications previously tried: none  -Current dietary patterns: eating 4 eggs per day -Current exercise habits: none -Educated on Cholesterol goals;  Importance of limiting foods high in cholesterol; Exercise goal of 150 minutes per week; -Counseled on diet and exercise extensively Recommended to continue current medication Recommended cutting back to 3 eggs per day.  Diabetes (A1c goal <8%) -Uncontrolled -Current medications: Glipizide 10 mg 1 tablet daily - Appropriate, Query effective, Safe, Accessible Trulicity 3 mg inject once weekly - every Monday - Appropriate, Query effective, Safe, Accessible Pioglitazone 30 mg 1 tablet daily - Appropriate, Query effective, Safe, Accessible -Medications previously tried: metformin (kidney function)  -Current home glucose readings fasting glucose: not checking at all  post prandial glucose: does not check -Denies hypoglycemic/hyperglycemic symptoms -Current meal patterns:  breakfast: 4 eggs and cheese toast for breakfast lunch: n/a  dinner: n/a snacks: sweets drinks: diet mountain dew -Current exercise: none -Educated on A1c and blood sugar goals; Complications of diabetes including kidney damage, retinal damage, and cardiovascular disease; Benefits of routine self-monitoring of blood sugar; Carbohydrate counting and/or plate method -Counseled to check feet daily and get yearly eye exams -Counseled on diet and exercise extensively Recommended to continue current  medication  Depression/Anxiety (Goal: minimize symptoms) -Not ideally controlled -Current treatment: Citalopram 20 mg 1 tablet daily - Appropriate, Query effective, Safe, Accessible -Medications previously tried/failed: none -PHQ9: 0 -GAD7: n/a -Educated on Benefits of medication for symptom control - Reassess effectiveness at follow up.  History of stroke (Goal: prevent strokes) -Controlled -Current treatment  Clopidogrel 75 mg 1 tablet daily - Appropriate, Effective, Safe, Accessible -Medications previously tried: none  -Recommended to continue current medication  Memory loss (Goal: slow memory loss) -Controlled -Current treatment  Donepezil 10 mg 1 tablet at bedtime - Appropriate, Effective, Safe, Accessible Memantine 10 mg 1 tablet twice daily - Appropriate, Effective, Safe, Accessible -Medications previously tried: none  -Counseled on diet and exercise extensively Recommended to continue current medication  BPH (Goal: minimize symptoms) -Uncontrolled -Current treatment  Tamsulosin 0.4 mg 1 capsule daily - Appropriate, Query effective, Safe, Accessible -Medications previously tried: none  -Recommended trial of overactive bladder medication.  Hypothyroidism (Goal: TSH 0.35-4.5) -Uncontrolled -Current treatment  Levothyroxine 50 mcg 1 tablet daily - Appropriate, Query effective, Safe, Accessible -Medications previously tried: none  -Counseled on the importance of taking this medication separate from all others and at least 30 minutes prior to food.  Health Maintenance -Vaccine gaps: shingrix, influenza, COVID booster -Current therapy:  Levocetirizine 5 mg 1 tablet daily Vitamin B12 1000 mcg 1 tablet daily -Educated on Cost vs benefit of each product must be carefully weighed by individual consumer -Patient is satisfied with current therapy and denies issues -Recommended to continue current medication  Patient Goals/Self-Care Activities Patient will:  - take  medications as prescribed check glucose daily, document, and provide at future appointments check blood pressure weekly, document, and provide at future appointments target a minimum   of 150 minutes of moderate intensity exercise weekly  Follow Up Plan: Telephone follow up appointment with care management team member scheduled for: 3 months      Medication Assistance: None required.  Patient affirms current coverage meets needs.  Compliance/Adherence/Medication fill history: Care Gaps: Shingrix, foot exam, COVID booster, eye exam, influenza vaccine A1c - 11.2 (11/11/21)  Star-Rating Drugs: Atorvastatin 40 mg - Last filled 09/27/21 90 DS at [facility] Pioglitazone (Actos) 30 mg - Last filled 11/11/21 90 DS at [facility] Lisinopril(Zestril) 40 mg - Last filled 12/02/21 90 DS at [facility] Glipizide (Glucotrol) 10 mg - Last filled 11/11/21 90 DS  Duaglutide (Trulicity) 3 mg - Last filled 11/11/21 84 DS at [facility]  Patient's preferred pharmacy is:  [facility] - [place], [place] - [address] [place] [address] Phone: [phone] Fax: [phone]  [facility] - [place] - [address] [place] [address] Phone: [phone] Fax: [phone]   Uses pill box? Yes Pt endorses 70% compliance  We discussed: Benefits of medication synchronization, packaging and delivery as well as enhanced pharmacist oversight with [facility]. Patient decided to: Continue current medication management strategy  Care Plan and Follow Up Patient Decision:  Patient agrees to Care Plan and Follow-up.  Plan: Telephone follow up appointment with care management team member scheduled for:  3 months  Madeline , PharmD, BCACP Clinical Pharmacist  [facility] [phone]   

## 2021-12-26 ENCOUNTER — Telehealth: Payer: Self-pay | Admitting: Adult Health

## 2021-12-26 NOTE — Telephone Encounter (Signed)
Noted  

## 2021-12-26 NOTE — Telephone Encounter (Signed)
Patient's Capability to Manage Benefits form to be filled out--placed in dr's folder.  Follow instructions in envelope upon completion.

## 2021-12-30 NOTE — Telephone Encounter (Signed)
PPW has been completed. Lm for pt daughter.

## 2022-01-01 ENCOUNTER — Other Ambulatory Visit: Payer: Self-pay

## 2022-01-01 ENCOUNTER — Emergency Department (HOSPITAL_COMMUNITY): Payer: Medicare HMO

## 2022-01-01 ENCOUNTER — Telehealth: Payer: Self-pay

## 2022-01-01 ENCOUNTER — Inpatient Hospital Stay (HOSPITAL_COMMUNITY)
Admission: EM | Admit: 2022-01-01 | Discharge: 2022-01-03 | DRG: 065 | Disposition: A | Discharge status: Home Health | Payer: Medicare HMO | Attending: Family Medicine | Admitting: Family Medicine

## 2022-01-01 ENCOUNTER — Encounter (HOSPITAL_COMMUNITY): Payer: Self-pay

## 2022-01-01 DIAGNOSIS — I639 Cerebral infarction, unspecified: Secondary | ICD-10-CM | POA: Diagnosis present

## 2022-01-01 DIAGNOSIS — F03B18 Unspecified dementia, moderate, with other behavioral disturbance: Secondary | ICD-10-CM | POA: Diagnosis present

## 2022-01-01 DIAGNOSIS — J9811 Atelectasis: Secondary | ICD-10-CM | POA: Diagnosis not present

## 2022-01-01 DIAGNOSIS — R29708 NIHSS score 8: Secondary | ICD-10-CM | POA: Diagnosis present

## 2022-01-01 DIAGNOSIS — Z833 Family history of diabetes mellitus: Secondary | ICD-10-CM | POA: Diagnosis not present

## 2022-01-01 DIAGNOSIS — I440 Atrioventricular block, first degree: Secondary | ICD-10-CM | POA: Diagnosis present

## 2022-01-01 DIAGNOSIS — E1165 Type 2 diabetes mellitus with hyperglycemia: Secondary | ICD-10-CM | POA: Diagnosis not present

## 2022-01-01 DIAGNOSIS — D509 Iron deficiency anemia, unspecified: Secondary | ICD-10-CM | POA: Diagnosis present

## 2022-01-01 DIAGNOSIS — E039 Hypothyroidism, unspecified: Secondary | ICD-10-CM | POA: Diagnosis not present

## 2022-01-01 DIAGNOSIS — Z79899 Other long term (current) drug therapy: Secondary | ICD-10-CM | POA: Diagnosis not present

## 2022-01-01 DIAGNOSIS — I129 Hypertensive chronic kidney disease with stage 1 through stage 4 chronic kidney disease, or unspecified chronic kidney disease: Secondary | ICD-10-CM | POA: Diagnosis not present

## 2022-01-01 DIAGNOSIS — E785 Hyperlipidemia, unspecified: Secondary | ICD-10-CM | POA: Diagnosis not present

## 2022-01-01 DIAGNOSIS — Z8679 Personal history of other diseases of the circulatory system: Secondary | ICD-10-CM | POA: Diagnosis present

## 2022-01-01 DIAGNOSIS — Z7985 Long-term (current) use of injectable non-insulin antidiabetic drugs: Secondary | ICD-10-CM | POA: Diagnosis not present

## 2022-01-01 DIAGNOSIS — I63531 Cerebral infarction due to unspecified occlusion or stenosis of right posterior cerebral artery: Secondary | ICD-10-CM | POA: Diagnosis not present

## 2022-01-01 DIAGNOSIS — Z8673 Personal history of transient ischemic attack (TIA), and cerebral infarction without residual deficits: Secondary | ICD-10-CM | POA: Diagnosis not present

## 2022-01-01 DIAGNOSIS — E1159 Type 2 diabetes mellitus with other circulatory complications: Secondary | ICD-10-CM | POA: Diagnosis present

## 2022-01-01 DIAGNOSIS — R471 Dysarthria and anarthria: Secondary | ICD-10-CM | POA: Diagnosis present

## 2022-01-01 DIAGNOSIS — N1832 Chronic kidney disease, stage 3b: Secondary | ICD-10-CM | POA: Diagnosis not present

## 2022-01-01 DIAGNOSIS — Z8249 Family history of ischemic heart disease and other diseases of the circulatory system: Secondary | ICD-10-CM | POA: Diagnosis not present

## 2022-01-01 DIAGNOSIS — Z7984 Long term (current) use of oral hypoglycemic drugs: Secondary | ICD-10-CM

## 2022-01-01 DIAGNOSIS — Z87891 Personal history of nicotine dependence: Secondary | ICD-10-CM

## 2022-01-01 DIAGNOSIS — I6389 Other cerebral infarction: Principal | ICD-10-CM | POA: Diagnosis present

## 2022-01-01 DIAGNOSIS — E1122 Type 2 diabetes mellitus with diabetic chronic kidney disease: Secondary | ICD-10-CM | POA: Diagnosis present

## 2022-01-01 DIAGNOSIS — F03A0 Unspecified dementia, mild, without behavioral disturbance, psychotic disturbance, mood disturbance, and anxiety: Secondary | ICD-10-CM | POA: Diagnosis present

## 2022-01-01 DIAGNOSIS — Z823 Family history of stroke: Secondary | ICD-10-CM | POA: Diagnosis not present

## 2022-01-01 DIAGNOSIS — N183 Chronic kidney disease, stage 3 unspecified: Secondary | ICD-10-CM | POA: Diagnosis present

## 2022-01-01 DIAGNOSIS — R4701 Aphasia: Secondary | ICD-10-CM | POA: Diagnosis not present

## 2022-01-01 DIAGNOSIS — I10 Essential (primary) hypertension: Secondary | ICD-10-CM | POA: Diagnosis present

## 2022-01-01 DIAGNOSIS — Z7902 Long term (current) use of antithrombotics/antiplatelets: Secondary | ICD-10-CM | POA: Diagnosis not present

## 2022-01-01 DIAGNOSIS — R4781 Slurred speech: Secondary | ICD-10-CM | POA: Diagnosis not present

## 2022-01-01 DIAGNOSIS — D649 Anemia, unspecified: Secondary | ICD-10-CM | POA: Diagnosis not present

## 2022-01-01 DIAGNOSIS — E119 Type 2 diabetes mellitus without complications: Secondary | ICD-10-CM | POA: Diagnosis present

## 2022-01-01 DIAGNOSIS — Z79890 Hormone replacement therapy (postmenopausal): Secondary | ICD-10-CM

## 2022-01-01 DIAGNOSIS — M47816 Spondylosis without myelopathy or radiculopathy, lumbar region: Secondary | ICD-10-CM | POA: Diagnosis not present

## 2022-01-01 DIAGNOSIS — R531 Weakness: Secondary | ICD-10-CM | POA: Diagnosis not present

## 2022-01-01 LAB — COMPREHENSIVE METABOLIC PANEL
ALT: 9 U/L (ref 0–44)
AST: 15 U/L (ref 15–41)
Albumin: 3.7 g/dL (ref 3.5–5.0)
Alkaline Phosphatase: 114 U/L (ref 38–126)
Anion gap: 8 (ref 5–15)
BUN: 20 mg/dL (ref 8–23)
CO2: 27 mmol/L (ref 22–32)
Calcium: 9.1 mg/dL (ref 8.9–10.3)
Chloride: 103 mmol/L (ref 98–111)
Creatinine, Ser: 1.7 mg/dL — ABNORMAL HIGH (ref 0.61–1.24)
GFR, Estimated: 40 mL/min — ABNORMAL LOW (ref >60)
Glucose, Bld: 130 mg/dL — ABNORMAL HIGH (ref 70–99)
Potassium: 4.1 mmol/L (ref 3.5–5.1)
Sodium: 138 mmol/L (ref 135–145)
Total Bilirubin: 0.1 mg/dL — ABNORMAL LOW (ref 0.3–1.2)
Total Protein: 6.9 g/dL (ref 6.5–8.1)

## 2022-01-01 LAB — PROTIME-INR
INR: 1 (ref 0.8–1.2)
Prothrombin Time: 13.4 seconds (ref 11.4–15.2)

## 2022-01-01 LAB — DIFFERENTIAL
Abs Immature Granulocytes: 0.01 10*3/uL (ref 0.00–0.07)
Basophils Absolute: 0 10*3/uL (ref 0.0–0.1)
Basophils Relative: 0 %
Eosinophils Absolute: 0.2 10*3/uL (ref 0.0–0.5)
Eosinophils Relative: 4 %
Immature Granulocytes: 0 %
Lymphocytes Relative: 21 %
Lymphs Abs: 1 10*3/uL (ref 0.7–4.0)
Monocytes Absolute: 0.5 10*3/uL (ref 0.1–1.0)
Monocytes Relative: 10 %
Neutro Abs: 3 10*3/uL (ref 1.7–7.7)
Neutrophils Relative %: 65 %

## 2022-01-01 LAB — CBC
HCT: 34.7 % — ABNORMAL LOW (ref 39.0–52.0)
Hemoglobin: 10.9 g/dL — ABNORMAL LOW (ref 13.0–17.0)
MCH: 29.5 pg (ref 26.0–34.0)
MCHC: 31.4 g/dL (ref 30.0–36.0)
MCV: 94 fL (ref 80.0–100.0)
Platelets: 243 10*3/uL (ref 150–400)
RBC: 3.69 MIL/uL — ABNORMAL LOW (ref 4.22–5.81)
RDW: 13.9 % (ref 11.5–15.5)
WBC: 4.7 10*3/uL (ref 4.0–10.5)
nRBC: 0 % (ref 0.0–0.2)

## 2022-01-01 LAB — CBG MONITORING, ED: Glucose-Capillary: 80 mg/dL (ref 70–99)

## 2022-01-01 LAB — APTT: aPTT: 29 seconds (ref 24–36)

## 2022-01-01 MED ORDER — SODIUM CHLORIDE 0.9% FLUSH: 3.0000 mL | Freq: Once | INTRAVENOUS | Status: DC

## 2022-01-01 MED ORDER — INSULIN ASPART 100 UNIT/ML IJ SOLN
0.0000 [IU] | INTRAMUSCULAR | Status: DC
  Administered 2022-01-02 (×3): 1 [IU] via SUBCUTANEOUS
  Administered 2022-01-02: 2 [IU] via SUBCUTANEOUS
  Administered 2022-01-02: 1 [IU] via SUBCUTANEOUS
  Administered 2022-01-03: 2 [IU] via SUBCUTANEOUS

## 2022-01-01 MED ORDER — ASPIRIN 81 MG PO CHEW
81.0000 mg | CHEWABLE_TABLET | Freq: Every day | ORAL | Status: DC
  Administered 2022-01-02 – 2022-01-03 (×2): 81 mg via ORAL
  Filled 2022-01-01 (×2): qty 1

## 2022-01-01 NOTE — Assessment & Plan Note (Signed)
Monitor for any sundowning 

## 2022-01-01 NOTE — ED Notes (Signed)
Patient transported to MRI 

## 2022-01-01 NOTE — H&P (Addendum)
Scott Vasquez KVQ:[id number] DOB: 1940/11/19 DOA: 01/01/2022     PCP: [name], NP   Outpatient Specialists:     NEurology Rondel Jumbo, PA    Patient arrived to ER on 01/01/22 at 1329 Referred by Attending Carmin Muskrat, MD   Patient coming from:    home Lives With family    Chief Complaint:   Chief Complaint  Patient presents with   dysarthria    HPI: Scott Vasquez is a 81 y.o. male with medical history significant of hx of CVA in 2001, htn, HLD, DM2, Dementia, CKD stage 3b, anemia    Presented with   slurred speech Family noted that the patient have had some trouble speaking for past 5 days and may be even prior to that Family reports slurred speech son stated that  While they were talking on the phone he was noted to have some slurred speech is unclear how long its been going on.  Otherwise no localized weakness or numbness Patient has dementia at baseline. Patient is on Plavix and Lipitor history of prior stroke.      Regarding pertinent Chronic problems:     Hyperlipidemia - on statins Lipitor Lipid Panel     Component Value Date/Time   CHOL 259 (H) 08/19/2021 0821   TRIG 68.0 08/19/2021 0821   TRIG 31 07/16/2006 1037   HDL 65.50 08/19/2021 0821   CHOLHDL 4 08/19/2021 0821   VLDL 13.6 08/19/2021 0821   LDLCALC 180 (H) 08/19/2021 0821   LDLCALC 74 07/17/2020 1025     HTN on Lisinopril         DM 2 -  Lab Results  Component Value Date   HGBA1C 11.2 (A) 11/11/2021   PO meds and trulicity    Hx of CVA -  with residual deficits on plavix   CKD stage IIIb- baseline Cr 1.6 Estimated Creatinine Clearance: 38.3 mL/min (A) (by C-G formula based on SCr of 1.7 mg/dL (H)).  Lab Results  Component Value Date   CREATININE 1.70 (H) 01/01/2022   CREATININE 1.55 (H) 08/19/2021   CREATININE 1.52 (H) 07/17/2020    Chronic anemia - baseline hg Hemoglobin & Hematocrit  Recent Labs    08/19/21 0821 01/01/22 1355  HGB 11.2* 10.9*    While in  ER:   MRI showing new CVA and neurology has been consulted   Ordered  CT HEAD   NON acute KUB - NON acute CXR -  NON acute   MRI - Acute and/or subacute infarcts in the right medial frontal gyrus, left inferior frontal gyrus, left body of the corpus callosum, left basal ganglia, and left cerebral peduncle/thalamus.  Following Medications were ordered in ER: Medications  sodium chloride flush (NS) 0.9 % injection 3 mL (3 mLs Intravenous Not Given 01/01/22 1948)    _______________________________________________________ ER Provider Called:  Neurology     Dr. Curly Shores They Recommend admit to medicine  will see in ER   ED Triage Vitals  Enc Vitals Group     BP 01/01/22 1340 (!) 147/78     Pulse Rate 01/01/22 1340 77     Resp 01/01/22 1340 17     Temp 01/01/22 1340 99.1 F (37.3 C)     Temp src --      SpO2 01/01/22 1340 100 %     Weight 01/01/22 1339 200 lb (90.7 kg)     Height 01/01/22 1339 5' 10.5" (1.791 m)     Head Circumference --  Peak Flow --      Pain Score 01/01/22 1339 0     Pain Loc --      Pain Edu? --      Excl. in [place]? --   TMAX(24)@     _________________________________________ Significant initial  Findings: Abnormal Labs Reviewed  CBC - Abnormal; Notable for the following components:      Result Value   RBC 3.69 (*)    Hemoglobin 10.9 (*)    HCT 34.7 (*)    All other components within normal limits  COMPREHENSIVE METABOLIC PANEL - Abnormal; Notable for the following components:   Glucose, Bld 130 (*)    Creatinine, Ser 1.70 (*)    Total Bilirubin 0.1 (*)    GFR, Estimated 40 (*)    All other components within normal limits    ECG: Ordered Personally reviewed by me showing: HR : 76 Rhythm: Normal sinus rhythm Nonspecific T wave abnormality Abnormal ECG [id number]     The recent clinical data is shown below. Vitals:   01/01/22 2000 01/01/22 2015 01/01/22 2127 01/01/22 2128  BP: (!) 187/92 (!) 172/71 (!) 155/77   Pulse: 77 (!) 38 64 84   Resp:   12 17  Temp:      SpO2: 95% 100% 94% 94%  Weight:      Height:           WBC     Component Value Date/Time   WBC 4.7 01/01/2022 1355   LYMPHSABS 1.0 01/01/2022 1355   MONOABS 0.5 01/01/2022 1355   EOSABS 0.2 01/01/2022 1355   BASOSABS 0.0 01/01/2022 1355      Results for orders placed or performed in visit on 07/24/16  Urine culture     Status: None   Collection Time: 07/24/16  5:04 PM   Specimen: Urine  Result Value Ref Range Status   Organism ID, Bacteria NO GROWTH  Final     _______________________________________________ Hospitalist was called for admission for   Acute CVA     The following Work up has been ordered so far:  Orders Placed This Encounter  Procedures   CT HEAD WO CONTRAST   MR BRAIN WO CONTRAST   DG Abd 1 View   DG Chest 1 View   Protime-INR   APTT   CBC   Differential   Comprehensive metabolic panel   Diet regular Room service appropriate? Yes; Fluid consistency: Thin   Cardiac monitoring   Swallow screen   NIH Stroke Scale   Modified Stroke Scale (mNIHSS) Document mNIHSS assessment every 2 hours for a total of 12 hours   Saline Lock IV, Maintain IV access   If O2 sat   Consult to neurology   Consult to hospitalist   Pulse oximetry, continuous   CBG monitoring, ED   EKG 12-Lead   ED EKG     OTHER Significant initial  Findings:  labs showing:    Recent Labs  Lab 01/01/22 1355  NA 138  K 4.1  CO2 27  GLUCOSE 130*  BUN 20  CREATININE 1.70*  CALCIUM 9.1    Cr  stable  Lab Results  Component Value Date   CREATININE 1.70 (H) 01/01/2022   CREATININE 1.55 (H) 08/19/2021   CREATININE 1.52 (H) 07/17/2020    Recent Labs  Lab 01/01/22 1355  AST 15  ALT 9  ALKPHOS 114  BILITOT 0.1*  PROT 6.9  ALBUMIN 3.7   Lab Results  Component Value Date   CALCIUM 9.1  01/01/2022    Plt: Lab Results  Component Value Date   PLT 243 01/01/2022     Recent Labs  Lab 01/01/22 1355  WBC 4.7  NEUTROABS 3.0  HGB  10.9*  HCT 34.7*  MCV 94.0  PLT 243    HG/HCT  stable,       Component Value Date/Time   HGB 10.9 (L) 01/01/2022 1355   HCT 34.7 (L) 01/01/2022 1355   MCV 94.0 01/01/2022 1355      DM  labs:  HbA1C: Recent Labs    05/16/21 1610 08/19/21 0821 11/11/21 1103  HGBA1C 8.9* 8.3* 11.2*       CBG (last 3)  Recent Labs    01/01/22 1929  GLUCAP 80          Cultures:    Component Value Date/Time   SDES WOUND 04/29/2015 1109   SDES WOUND 04/29/2015 1109   SPECREQUEST PATIENT ON FOLLOWING ANCEF LEFT THYROID MASS 04/29/2015 1109   SPECREQUEST PATIENT ON FOLLOWING ANCEF LEFT THYROID MASS 04/29/2015 1109   CULT  04/29/2015 1109    NO ANAEROBES ISOLATED Performed at [facility]  04/29/2015 1109    NO GROWTH 3 DAYS Performed at [facility] 05/04/2015 FINAL 04/29/2015 1109   REPTSTATUS 05/02/2015 FINAL 04/29/2015 1109     Radiological Exams on Admission: DG Chest 1 View  Result Date: 01/01/2022 CLINICAL DATA:  Weakness, slurred speech, pre MRI EXAM: CHEST  1 VIEW COMPARISON:  CT chest dated 07/20/2017 FINDINGS: Low lung volumes with mild bibasilar atelectasis. No pleural effusion or pneumothorax. The heart is normal in size. 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 No radiopaque foreign body is seen. IMPRESSION: Low lung volumes with mild bibasilar atelectasis. Electronically Signed   By: Julian Hy M.D.   On: 01/01/2022 19:15   DG Abd 1 View  Result Date: 01/01/2022 CLINICAL DATA:  Weakness, slurred speech, pre MRI EXAM: ABDOMEN - 1 VIEW COMPARISON:  None Available. FINDINGS: Nonobstructive bowel gas pattern. Mild degenerative changes of the lumbar spine. Penile prosthesis. No radiopaque foreign body is seen. IMPRESSION: Negative. Electronically Signed   By: Julian Hy M.D.   On: 01/01/2022 19:12   CT HEAD WO CONTRAST  Result Date: 01/01/2022 CLINICAL DATA:  Provided history: Slurred  speech. Additional history provided: Slurred speech for 5 days. EXAM: CT HEAD WITHOUT CONTRAST TECHNIQUE: Contiguous axial images were obtained from the base of the skull through the vertex without intravenous contrast. RADIATION DOSE REDUCTION: This exam was performed according to the departmental dose-optimization program which includes automated exposure control, adjustment of the mA and/or kV according to patient size and/or use of iterative reconstruction technique. COMPARISON:  Brain MRI 10/20/2018. Head CT 10/20/2014. FINDINGS: Brain: Mild-to-moderate generalized cerebral atrophy. Known chronic small-vessel infarcts within the bilateral cerebral hemispheric white matter and right thalamus. Background patchy and ill-defined hypoattenuation within the cerebral white matter, nonspecific but compatible with moderate to advanced chronic small vessel ischemic disease. Focus of Wallerian degeneration within the bilateral cerebral white matter and callosal body. There is no acute intracranial hemorrhage. No demarcated cortical infarct. No extra-axial fluid collection. No evidence of an intracranial mass. No midline shift. Vascular: No hyperdense vessel. Atherosclerotic calcifications. Skull: No fracture or aggressive osseous lesion. Sinuses/Orbits: No mass or acute finding within the imaged orbits. Minimal mucosal thickening within the bilateral frontal, ethmoid and maxillary sinuses. IMPRESSION: 1. No evidence of acute intracranial abnormality. 2. Known chronic small-vessel  infarcts within the bilateral cerebral hemispheric white matter and right thalamus. 3. Background moderate to advanced cerebral white matter chronic small vessel ischemic disease. 4. Mild-to-moderate generalized cerebral atrophy. Electronically Signed   By: Kellie Simmering D.O.   On: 01/01/2022 14:49   MR BRAIN WO CONTRAST  Result Date: 01/01/2022 CLINICAL DATA:  Slurred speech EXAM: MRI HEAD WITHOUT CONTRAST TECHNIQUE: Multiplanar, multiecho  pulse sequences of the brain and surrounding structures were obtained without intravenous contrast. COMPARISON:  10/20/2018 MRI head, correlation is also made with CT head 01/01/2022 FINDINGS: Evaluation is somewhat limited by motion artifact. Brain: Small areas of of restricted diffusion with ADC correlates in the subcortical right medial frontal gyrus (series 5, image 96), left body of the corpus callosum (series 5, images 79-80), and subcortical left inferior frontal gyrus (series 5, image 78), with punctate foci in the left basal ganglia (series 5, image 70) and left cerebral peduncle/left thalamus (series 5, image 69). The left inferior frontal gyrus restricted diffusion is less intense than in the corpus callosum or right frontal gyrus, likely less acute. No acute hemorrhage, mass, mass effect, or midline shift. No hydrocephalus or extra-axial collection. 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 Lacunar infarcts in the right cerebellum, right pons, right anterior internal capsule, left posterior caudate/internal capsule, and bilateral corona radiata. No evidence of significant remote cortical infarct. Confluent T2 hyperintense signal in the periventricular white matter and pons, likely the sequela of severe chronic small vessel ischemic disease. Vascular: Normal arterial flow voids. Skull and upper cervical spine: Normal marrow signal. Sinuses/Orbits: No acute finding. Status post bilateral lens replacements. Other: The mastoids are well aerated. IMPRESSION: Acute and/or subacute infarcts in the right medial frontal gyrus, left inferior frontal gyrus, left body of the corpus callosum, left basal ganglia, and left cerebral peduncle/thalamus. These results were called by telephone at the time of interpretation on 01/01/2022 at 9:49 pm to provider Carmin Muskrat , who verbally acknowledged  these results. Electronically Signed   By: Merilyn Baba M.D.   On: 01/01/2022 21:49   _______________________________________________________________________________________________________ Latest  Blood pressure (!) 155/77, pulse 84, temperature 99.1 F (37.3 C), resp. rate 17, height 5' 10.5" (1.791 m), weight 90.7 kg, SpO2 94 %.   Vitals  labs and radiology finding personally reviewed  Review of Systems:    Pertinent positives include:   slurred speech,  Constitutional:  No weight loss, night sweats, Fevers, chills, fatigue, weight loss  HEENT:  No headaches, Difficulty swallowing,Tooth/dental problems,Sore throat,  No sneezing, itching, ear ache, nasal congestion, post nasal drip,  Cardio-vascular:  No chest pain, Orthopnea, PND, anasarca, dizziness, palpitations.no Bilateral lower extremity swelling  GI:  No heartburn, indigestion, abdominal pain, nausea, vomiting, diarrhea, change in bowel habits, loss of appetite, melena, blood in stool, hematemesis Resp:  no shortness of breath at rest. No dyspnea on exertion, No excess mucus, no productive cough, No non-productive cough, No coughing up of blood.No change in color of mucus.No wheezing. Skin:  no rash or lesions. No jaundice GU:  no dysuria, change in color of urine, no urgency or frequency. No straining to urinate.  No flank pain.  Musculoskeletal:  No joint pain or no joint swelling. No decreased range of motion. No back pain.  Psych:  No change in mood or affect. No depression or anxiety. No memory loss.  Neuro: no localizing neurological complaints, no tingling, no weakness, no double vision, no  gait abnormality, no no confusion  All systems reviewed and apart from [place] all are negative _______________________________________________________________________________________________ Past Medical History:   Past Medical History:  Diagnosis Date   Anemia    iron deficiency   Diabetes mellitus    type II    Diverticulosis of colon    Hypertension    Stroke ([place])    numbness in left hand   Thyroid nodule       Past Surgical History:  Procedure Laterality Date   BACK SURGERY     CATARACT EXTRACTION, BILATERAL     SHOULDER SURGERY  08/2007   Supple   THYROID LOBECTOMY Left 04/29/2015   THYROID LOBECTOMY Left 04/29/2015   Procedure: THYROID LOBECTOMY;  Surgeon: Armandina Gemma, MD;  Location: [facility];  Service: General;  Laterality: Left;    Social History:  Ambulatory   independently       reports that he quit smoking about 63 years ago. His smoking use included cigarettes. He started smoking about 70 years ago. He has a 10.00 pack-year smoking history. He has never used smokeless tobacco. He reports that he does not drink alcohol and does not use drugs.     Family History:   Family History  Problem Relation Age of Onset   Diabetes Other    Diabetes Father        Died from diabetic coma in 10/09/1970   Hypertension Father    Stroke Mother        Died in 33   Hypertension Mother    ______________________________________________________________________________________________ Allergies: Allergies  Allergen Reactions   Actos [Pioglitazone]     Diarrhea    Metformin And Related     Diarrhea       Prior to Admission medications   Medication Sig Start Date End Date Taking? Authorizing Provider  ACCU-CHEK AVIVA PLUS test strip TEST BLOOD SUGAR TWICE DAILY 01/08/21   Nafziger, Tommi Rumps, NP  atorvastatin (LIPITOR) 40 MG tablet Take 1 tablet (40 mg total) by mouth daily. 08/19/21   Nafziger, Tommi Rumps, NP  Blood Glucose Monitoring Suppl (ACCU-CHEK AVIVA PLUS) w/Device KIT USE AS DIRECTED 03/25/21   Nafziger, Tommi Rumps, NP  citalopram (CELEXA) 20 MG tablet Take 1 tablet (20 mg total) by mouth daily. 05/20/21   Rondel Jumbo, PA-C  clopidogrel (PLAVIX) 75 MG tablet Take 1 tablet (75 mg total) by mouth daily. 08/19/21   Nafziger, Tommi Rumps, NP  cyanocobalamin 1000 MCG tablet Take 1,000 mcg by mouth daily.     [provider]  donepezil (ARICEPT) 10 MG tablet Take 1 tablet (10 mg total) by mouth at bedtime. 05/20/21   Rondel Jumbo, PA-C  Dulaglutide (TRULICITY) 3 MA/2.6JF SOPN Inject 3 mg as directed once a week. 11/11/21 02/09/22  Nafziger, Tommi Rumps, NP  glipiZIDE (GLUCOTROL XL) 10 MG 24 hr tablet Take 1 tablet (10 mg total) by mouth daily with breakfast. 11/11/21   Nafziger, Tommi Rumps, NP  levocetirizine (XYZAL) 5 MG tablet TAKE ONE TABLET BY MOUTH EVERY EVENING. 06/02/16   Nafziger, Tommi Rumps, NP  levothyroxine (SYNTHROID) 50 MCG tablet Take 1 tablet (50 mcg total) by mouth daily. 08/19/21   Nafziger, Tommi Rumps, NP  lisinopril (ZESTRIL) 40 MG tablet Take 1 tablet (40 mg total) by mouth daily. 08/19/21   Nafziger, Tommi Rumps, NP  memantine (NAMENDA) 10 MG tablet 1 TAB TWICE A DAY 05/20/21   Wertman, Coralee Pesa, PA-C  ONETOUCH DELICA LANCETS 35K MISC 1 each by Other route See admin instructions. Check blood sugar twice daily 10/25/14  [provider]  oxybutynin (DITROPAN XL) 10 MG 24 hr tablet Take 1 tablet (10 mg total) by mouth at bedtime. 08/19/21   Nafziger, Tommi Rumps, NP  pioglitazone (ACTOS) 30 MG tablet Take 1 tablet (30 mg total) by mouth daily. 11/11/21   Nafziger, Tommi Rumps, NP  Vibegron (GEMTESA) 75 MG TABS Take 1 tablet by mouth daily.    [provider]    ___________________________________________________________________________________________________ Physical Exam:    01/01/2022    9:28 PM 01/01/2022    9:27 PM 01/01/2022    8:15 PM  Vitals with BMI  Systolic  628 366  Diastolic  77 71  Pulse 84 64 38     1. General:  in No  Acute distress   Chronically ill   -appearing 2. Psychological: Alert and   Oriented to self and situation, states he is in [place] [facility] 3. Head/ENT:   Moist  Mucous Membranes                          Head Non traumatic, neck supple                          Poor Dentition 4. SKIN: normal   Skin turgor,  Skin clean Dry and intact no rash 5. Heart: Regular rate and rhythm  no  Murmur, no Rub or gallop 6. Lungs: , no wheezes or crackles   7. Abdomen: Soft,  non-tender,   distended   obese  bowel sounds present 8. Lower extremities: no clubbing, cyanosis, no  edema 9. Neurologically  strength 5 out of 5 in all 4 extremities cranial nerves II through XII intact 10. MSK: Normal range of motion    Chart has been reviewed  ______________________________________________________________________________________________  Assessment/Plan 81 y.o. male with medical history significant of hx of CVA in 2001, htn, HLD, DM2, Dementia, CKD stage 3b, anemia    Admitted for   Acute CVA (cerebrovascular accident) ([place])      Present on Admission:  Hyperlipidemia  Iron deficiency anemia  Essential hypertension  Moderate dementia with behavioral disturbance ([place])  Stroke with cerebral ischemia ([place])  Uncontrolled type 2 diabetes mellitus with hyperglycemia ([place])  CKD (chronic kidney disease), stage III ([place])  Stroke ([place])  Hypothyroidism     Hyperlipidemia Continue Lipitor  Iron deficiency anemia Stable,  Will check anemia panel hemoccult stool  Essential hypertension Allow permissive HTN  Moderate dementia with behavioral disturbance ([place]) Monitor for any sundowning  Stroke with cerebral ischemia ([place])  - will admit based on TIA/CVA protocol,        Monitor on Tele       MRI  Resulted - showing acute ischemic CVA        CTA not ordered due to CKD MRA ordered, Carotid Doppler ordered       Echo to evaluate for possible embolic source,        obtain cardiac enzymes,  ECG,   Lipid panel, TSH.        Order PT/OT evaluation.        keep nothing by mouth until passes swallow eval        Will make sure patient is on antiplatelet ASA 81 Plavix agent and statin        Allow permissive Hypertension keep BP <220/120        Neurology consulted Have seen pt in ER    Uncontrolled type 2 diabetes mellitus with hyperglycemia ([place])  Hold po meds, order sliding  scale  CKD (chronic kidney disease), stage III ([facility])  -chronic avoid nephrotoxic medications such as NSAIDs, Vanco Zosyn combo,  avoid hypotension, continue to follow renal function   Hypothyroidism - Check TSH continue home medications at current dose    Other plan as per orders.  DVT prophylaxis:  SCD     Code Status:    Code Status: Prior FULL CODE   as per patient    I had personally discussed CODE STATUS with patient      Family Communication:   Family not at  Bedside    Disposition Plan:       To home vs placement once workup is complete and patient is stable   Following barriers for discharge:                            Stroke work up completed                           Will need consultants to evaluate patient prior to discharge                      Would benefit from PT/OT eval prior to DC  Ordered                                       Diabetes care coordinator                                       Consults called: neurology is aware  Admission status:  ED Disposition     ED Disposition  [place]: [facility] [[id number]]  Level of Care: Telemetry Medical [[id number]]  May admit patient to [facility] or [facility] if equivalent level of care is available:: No  Covid Evaluation: Asymptomatic - no recent exposure (last 10 days) testing not required  Diagnosis: Stroke [facility]) [[id number]]  Admitting Physician: Toy Baker [[id number]]  Attending Physician: Toy Baker [[id number]]  Estimated length of stay: past midnight tomorrow  Certification:: I certify this patient will need inpatient services for at least 2 midnights           inpatient     I Expect 2 midnight stay secondary to severity of patient's current illness need for inpatient interventions justified by the following:    Severe lab/radiological/exam abnormalities including:    CVA and extensive comorbidities including:    DM2  CKD dementia     That are currently affecting medical management.   I expect  patient to be hospitalized for 2 midnights requiring inpatient medical care.  Patient is at high risk for adverse outcome (such as loss of life or disability) if not treated.  Indication for inpatient stay as follows: new CVA       Level of care     tele  indefinitely please discontinue once patient no longer qualifies COVID-19 Labs    Yesenia Fontenette 01/01/2022, 11:39 PM    [facility]     after 2 AM please page floor coverage PA If 7AM-7PM, please contact the day team taking care of the patient using [email or web address]   Patient  was evaluated in the context of the global COVID-19 pandemic, which necessitated consideration that the patient might be at risk for infection with the SARS-CoV-2 virus that causes COVID-19. Institutional protocols and algorithms that pertain to the evaluation of patients at risk for COVID-19 are in a state of rapid change based on information released by regulatory bodies including the CDC and federal and state organizations. These policies and algorithms were followed during the patient's care.

## 2022-01-01 NOTE — Assessment & Plan Note (Signed)
Stable,  Will check anemia panel hemoccult stool

## 2022-01-01 NOTE — ED Provider Triage Note (Signed)
Emergency Medicine Provider Triage Evaluation Note  Scott Vasquez , a 81 y.o. male  was evaluated in triage.  Pt complains of slurred speech has been ongoing for a week.  Son is at bedside and provides most of the history.  Last known normal was roughly a month ago but while they are talking on the phone they noticed that he was having some slurred speech.  Patient denies any focal weakness or numbness.  Review of Systems  Positive:  Negative: See above   Physical Exam  BP (!) 147/78 (BP Location: Left Arm)   Pulse 77   Temp 99.1 F (37.3 C)   Resp 17   Ht 5' 10.5" (1.791 m)   Wt 90.7 kg   SpO2 100%   BMI 28.29 kg/m  Gen:   Awake, no distress   Resp:  Normal effort  MSK:   Moves extremities without difficulty  Other:  Cranial nerves II through XII are intact.  5/5 strength to the upper and lower extremities.  Normal sensation to the upper lower extremities.  No pronator drift.  Medical Decision Making  Medically screening exam initiated at 1:49 PM.  Appropriate orders placed.  Scott Vasquez was informed that the remainder of the evaluation will be completed by another provider, this initial triage assessment does not replace that evaluation, and the importance of remaining in the ED until their evaluation is complete.     Hendricks Limes, [place] 01/01/22 1350

## 2022-01-01 NOTE — Assessment & Plan Note (Signed)
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        Order PT/OT evaluation.        keep nothing by mouth until passes swallow eval        Will make sure patient is on antiplatelet ASA 81 Plavix  agent and statin        Allow permissive Hypertension keep BP <220/120        Neurology consulted Have seen pt in ER

## 2022-01-01 NOTE — Assessment & Plan Note (Signed)
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        Order PT/OT evaluation.        keep nothing by mouth until passes swallow eval        Will make sure patient is on antiplatelet ASA 81 Plavix agent and statin        Allow permissive Hypertension keep BP <220/120        Neurology consulted Have seen pt in ER

## 2022-01-01 NOTE — Assessment & Plan Note (Signed)
Allow permissive HTN 

## 2022-01-01 NOTE — Telephone Encounter (Signed)
--  Caller states her husband has had change in speech 5 days ago.  12/31/2021 10:27:27 AM Call EMS 911 Now Carmon, RN, Denise  Referrals [facility] REFUSED  01/01/22 1017: Pt states he is going to hospital today. States symptoms started x 2 weeks ago. Plans to go to [facility] ED.

## 2022-01-01 NOTE — ED Provider Notes (Incomplete)
[facility] EMERGENCY DEPARTMENT Provider Note   CSN: [id number] Arrival date & time: 01/01/22  1329     History {Add pertinent medical, surgical, social history, OB history to HPI:1} Chief Complaint  Patient presents with  . dysarthria    Scott Vasquez is a 81 y.o. male.  HPI Patient presents with his son who provides the history.  Patient has dementia, level 5 caveat. Seemingly for the past week the patient has had dysarthria, the son was unaware of this until today when he saw his father. Patient's dementia is notable, but he does deny pain, weakness while acknowledging some difficulty with speech. Similarly the patient has been taking his medication as directed, and on his list Plavix is included, but not aspirin.     Home Medications Prior to Admission medications   Medication Sig Start Date End Date Taking? Authorizing Provider  ACCU-CHEK AVIVA PLUS test strip TEST BLOOD SUGAR TWICE DAILY 01/08/21   Nafziger, Tommi Rumps, NP  atorvastatin (LIPITOR) 40 MG tablet Take 1 tablet (40 mg total) by mouth daily. 08/19/21   Nafziger, Tommi Rumps, NP  Blood Glucose Monitoring Suppl (ACCU-CHEK AVIVA PLUS) w/Device KIT USE AS DIRECTED 03/25/21   Nafziger, Tommi Rumps, NP  citalopram (CELEXA) 20 MG tablet Take 1 tablet (20 mg total) by mouth daily. 05/20/21   Rondel Jumbo, PA-C  clopidogrel (PLAVIX) 75 MG tablet Take 1 tablet (75 mg total) by mouth daily. 08/19/21   Nafziger, Tommi Rumps, NP  cyanocobalamin 1000 MCG tablet Take 1,000 mcg by mouth daily.    [provider]  donepezil (ARICEPT) 10 MG tablet Take 1 tablet (10 mg total) by mouth at bedtime. 05/20/21   Rondel Jumbo, PA-C  Dulaglutide (TRULICITY) 3 VE/7.2CN SOPN Inject 3 mg as directed once a week. 11/11/21 02/09/22  Nafziger, Tommi Rumps, NP  glipiZIDE (GLUCOTROL XL) 10 MG 24 hr tablet Take 1 tablet (10 mg total) by mouth daily with breakfast. 11/11/21   Nafziger, Tommi Rumps, NP  levocetirizine (XYZAL) 5 MG tablet TAKE ONE TABLET BY MOUTH EVERY  EVENING. 06/02/16   Nafziger, Tommi Rumps, NP  levothyroxine (SYNTHROID) 50 MCG tablet Take 1 tablet (50 mcg total) by mouth daily. 08/19/21   Nafziger, Tommi Rumps, NP  lisinopril (ZESTRIL) 40 MG tablet Take 1 tablet (40 mg total) by mouth daily. 08/19/21   Nafziger, Tommi Rumps, NP  memantine (NAMENDA) 10 MG tablet 1 TAB TWICE A DAY 05/20/21   Wertman, Coralee Pesa, PA-C  ONETOUCH DELICA LANCETS 47S MISC 1 each by Other route See admin instructions. Check blood sugar twice daily 10/25/14   [provider]  oxybutynin (DITROPAN XL) 10 MG 24 hr tablet Take 1 tablet (10 mg total) by mouth at bedtime. 08/19/21   Nafziger, Tommi Rumps, NP  pioglitazone (ACTOS) 30 MG tablet Take 1 tablet (30 mg total) by mouth daily. 11/11/21   Nafziger, Tommi Rumps, NP  Vibegron (GEMTESA) 75 MG TABS Take 1 tablet by mouth daily.    [provider]      Allergies    Actos [pioglitazone] and Metformin and related    Review of Systems   Review of Systems  Unable to perform ROS: Dementia   Physical Exam Updated Vital Signs BP (!) 155/77   Pulse 84   Temp 99.1 F (37.3 C)   Resp 17   Ht 5' 10.5" (1.791 m)   Wt 90.7 kg   SpO2 94%   BMI 28.29 kg/m  Physical Exam Vitals and nursing note reviewed.  Constitutional:  General: He is not in acute distress.    Appearance: He is well-developed.  HENT:     Head: Normocephalic and atraumatic.  Eyes:     Conjunctiva/sclera: Conjunctivae normal.  Cardiovascular:     Rate and Rhythm: Normal rate and regular rhythm.  Pulmonary:     Effort: Pulmonary effort is normal. No respiratory distress.     Breath sounds: No stridor.  Abdominal:     General: There is no distension.  Skin:    General: Skin is warm and dry.  Neurological:     Mental Status: He is alert and oriented to person, place, and time.     Cranial Nerves: Dysarthria and facial asymmetry present.     Motor: Atrophy present.    ED Results / Procedures / Treatments   Labs (all labs ordered are listed, but only abnormal  results are displayed) Labs Reviewed  CBC - Abnormal; Notable for the following components:      Result Value   RBC 3.69 (*)    Hemoglobin 10.9 (*)    HCT 34.7 (*)    All other components within normal limits  COMPREHENSIVE METABOLIC PANEL - Abnormal; Notable for the following components:   Glucose, Bld 130 (*)    Creatinine, Ser 1.70 (*)    Total Bilirubin 0.1 (*)    GFR, Estimated 40 (*)    All other components within normal limits  PROTIME-INR  APTT  DIFFERENTIAL  CBG MONITORING, ED    EKG EKG Interpretation  Date/Time:  Thursday Jan 01 2022 13:36:42 EDT Ventricular Rate:  76 PR Interval:  166 QRS Duration: 76 QT Interval:  370 QTC Calculation: 416 R Axis:   31 Text Interpretation: Normal sinus rhythm Nonspecific T wave abnormality Abnormal ECG Confirmed by Carmin Muskrat [phone]) on 01/01/2022 5:52:17 PM  Radiology DG Chest 1 View  Result Date: 01/01/2022 CLINICAL DATA:  Weakness, slurred speech, pre MRI EXAM: CHEST  1 VIEW COMPARISON:  CT chest dated 07/20/2017 FINDINGS: Low lung volumes with mild bibasilar atelectasis. No pleural effusion or pneumothorax. The heart is normal in size. 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 No radiopaque foreign body is seen. IMPRESSION: Low lung volumes with mild bibasilar atelectasis. Electronically Signed   By: Julian Hy M.D.   On: 01/01/2022 19:15   DG Abd 1 View  Result Date: 01/01/2022 CLINICAL DATA:  Weakness, slurred speech, pre MRI EXAM: ABDOMEN - 1 VIEW COMPARISON:  None Available. FINDINGS: Nonobstructive bowel gas pattern. Mild degenerative changes of the lumbar spine. Penile prosthesis. No radiopaque foreign body is seen. IMPRESSION: Negative. Electronically Signed   By: Julian Hy M.D.   On: 01/01/2022 19:12   CT HEAD WO CONTRAST  Result Date: 01/01/2022 CLINICAL DATA:  Provided history: Slurred speech. Additional history provided: Slurred speech for 5 days. EXAM: CT HEAD WITHOUT  CONTRAST TECHNIQUE: Contiguous axial images were obtained from the base of the skull through the vertex without intravenous contrast. RADIATION DOSE REDUCTION: This exam was performed according to the departmental dose-optimization program which includes automated exposure control, adjustment of the mA and/or kV according to patient size and/or use of iterative reconstruction technique. COMPARISON:  Brain MRI 10/20/2018. Head CT 10/20/2014. FINDINGS: Brain: Mild-to-moderate generalized cerebral atrophy. Known chronic small-vessel infarcts within the bilateral cerebral hemispheric white matter and right thalamus. Background patchy and ill-defined hypoattenuation within the cerebral white matter, nonspecific but compatible with moderate to advanced chronic small vessel ischemic disease. Focus of Wallerian degeneration within the bilateral  cerebral white matter and callosal body. There is no acute intracranial hemorrhage. No demarcated cortical infarct. No extra-axial fluid collection. No evidence of an intracranial mass. No midline shift. Vascular: No hyperdense vessel. Atherosclerotic calcifications. Skull: No fracture or aggressive osseous lesion. Sinuses/Orbits: No mass or acute finding within the imaged orbits. Minimal mucosal thickening within the bilateral frontal, ethmoid and maxillary sinuses. IMPRESSION: 1. No evidence of acute intracranial abnormality. 2. Known chronic small-vessel infarcts within the bilateral cerebral hemispheric white matter and right thalamus. 3. Background moderate to advanced cerebral white matter chronic small vessel ischemic disease. 4. Mild-to-moderate generalized cerebral atrophy. Electronically Signed   By: Kyle  Golden D.O.   On: 01/01/2022 14:49   MR BRAIN WO CONTRAST  Result Date: 01/01/2022 CLINICAL DATA:  Slurred speech EXAM: MRI HEAD WITHOUT CONTRAST TECHNIQUE: Multiplanar, multiecho pulse sequences of the brain and surrounding structures were obtained without  intravenous contrast. COMPARISON:  10/20/2018 MRI head, correlation is also made with CT head 01/01/2022 FINDINGS: Evaluation is somewhat limited by motion artifact. Brain: Small areas of of restricted diffusion with ADC correlates in the subcortical right medial frontal gyrus (series 5, image 96), left body of the corpus callosum (series 5, images 79-80), and subcortical left inferior frontal gyrus (series 5, image 78), with punctate foci in the left basal ganglia (series 5, image 70) and left cerebral peduncle/left thalamus (series 5, image 69). The left inferior frontal gyrus restricted diffusion is less intense than in the corpus callosum or right frontal gyrus, likely less acute. No acute hemorrhage, mass, mass effect, or midline shift. No hydrocephalus or extra-axial collection. 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 Lacunar infarcts in the right cerebellum, right pons, right anterior internal capsule, left posterior caudate/internal capsule, and bilateral corona radiata. No evidence of significant remote cortical infarct. Confluent T2 hyperintense signal in the periventricular white matter and pons, likely the sequela of severe chronic small vessel ischemic disease. Vascular: Normal arterial flow voids. Skull and upper cervical spine: Normal marrow signal. Sinuses/Orbits: No acute finding. Status post bilateral lens replacements. Other: The mastoids are well aerated. IMPRESSION: Acute and/or subacute infarcts in the right medial frontal gyrus, left inferior frontal gyrus, left body of the corpus callosum, left basal ganglia, and left cerebral peduncle/thalamus. These results were called by telephone at the time of interpretation on 01/01/2022 at 9:49 pm to provider   , who verbally acknowledged these results. Electronically Signed   By: Alison  Vasan M.D.   On:  01/01/2022 21:49    Procedures Procedures  {Document cardiac monitor, telemetry assessment procedure when appropriate:1}  Medications Ordered in ED Medications  sodium chloride flush (NS) 0.9 % injection 3 mL (3 mLs Intravenous Not Given 01/01/22 1948)    ED Course/ Medical Decision Making/ A&P This patient with a Hx of dementia, prior stroke, hypertension, diabetes presents to the ED for concern of dysarthria and facial asymmetry, this involves an extensive number of treatment options, and is a complaint that carries with it a high risk of complications and morbidity.    The differential diagnosis includes stroke, recrudescence of prior stroke, nerve palsy, less likely medication reaction   Social Determinants of Health:  Dementia, age  Additional history obtained:  Additional history and/or information obtained from son, notable for details of history as above   After the initial evaluation, orders, including: CT, labs from triage reviewed, MRI ordered were initiated.   Patient placed on   Cardiac and Pulse-Oximetry Monitors. The patient was maintained on a cardiac monitor.  The cardiac monitored showed an rhythm of 70 sinus normal The patient was also maintained on pulse oximetry. The readings were typically 100% room air normal   On repeat evaluation of the patient stayed the same  Lab Tests:  I personally interpreted labs.  The pertinent results include: Mild anemia slight renal dysfunction, creatinine 1.7  Imaging Studies ordered:  I independently visualized and interpreted imaging which showed CT without obvious abnormalities, but MRI with multiple areas of infarct, acute and subacute.  I reviewed these images, and discussed it with the radiologist.  Consultations Obtained:  I requested consultation with the internal medicine and neurology,  and discussed lab and imaging findings as well as pertinent plan - they recommend: Admission by the former, latter for guidance  with ongoing stroke evaluation  Dispostion / Final MDM:  After consideration of the diagnostic results and the patient's response to treatment, this adult male with dementia, prior stroke presents with dysarthria, facial asymmetry for possibly a week.  Patient's vital signs are generally reassuring, he has notable neurologic abnormalities, and is found to have acute and subacute infarct on MRI.  As above, after discussion with radiology, neurology and internal medicine was admitted for further monitoring, management.  Final Clinical Impression(s) / ED Diagnoses Final diagnoses:  Acute CVA (cerebrovascular accident) ([place])    Rx / DC Orders ED Discharge Orders     None

## 2022-01-01 NOTE — Assessment & Plan Note (Signed)
Hold po meds, order sliding scale

## 2022-01-01 NOTE — ED Notes (Signed)
ED TO INPATIENT HANDOFF REPORT  ED Nurse Name and Phone #: [phone]  S Name/Age/Gender Scott Vasquez 81 y.o. male Room/Bed: 022C/022C  Code Status   Code Status: Prior  Home/SNF/Other Home Patient oriented to: self, place, time, and situation Is this baseline? Yes   Triage Complete: Triage complete  Chief Complaint Stroke [facility]) [I63.9]  Triage Note Pt arrived POV from home c/o slurred speech x1 week per family. Pt has no other complaints.    Allergies Allergies  Allergen Reactions   Actos [Pioglitazone]     Diarrhea    Metformin And Related     Diarrhea      Level of Care/Admitting Diagnosis ED Disposition     ED Disposition  Admit   Condition  --   [place]: [place] [[id number]]  Level of Care: Telemetry Medical [104]  May admit patient to [facility] or [facility] if equivalent level of care is available:: No  Covid Evaluation: Asymptomatic - no recent exposure (last 10 days) testing not required  Diagnosis: Stroke [facility]) [[id number]]  Admitting Physician: Toy Baker [[id number]]  Attending Physician: Toy Baker [[id number]]  Estimated length of stay: past midnight tomorrow  Certification:: I certify this patient will need inpatient services for at least 2 midnights          B Medical/Surgery History Past Medical History:  Diagnosis Date   Anemia    iron deficiency   Diabetes mellitus    type II   Diverticulosis of colon    Hypertension    Stroke ([place])    numbness in left hand   Thyroid nodule    Past Surgical History:  Procedure Laterality Date   BACK SURGERY     CATARACT EXTRACTION, BILATERAL     SHOULDER SURGERY  08/2007   Supple   THYROID LOBECTOMY Left 04/29/2015   THYROID LOBECTOMY Left 04/29/2015   Procedure: THYROID LOBECTOMY;  Surgeon: Armandina Gemma, MD;  Location: [place];  Service: General;  Laterality: Left;     A IV Location/Drains/Wounds Patient Lines/Drains/Airways Status     Active  Line/Drains/Airways     Name Placement date Placement time Site Days   Closed System Drain 1 Left Neck Bulb (JP) 7 Fr. 04/29/15  1153  Neck  2439   Incision (Closed) 04/29/15 Neck 04/29/15  1206  -- 2439            Intake/Output Last 24 hours No intake or output data in the 24 hours ending 01/01/22 2308  Labs/Imaging Results for orders placed or performed during the hospital encounter of 01/01/22 (from the past 48 hour(s))  Protime-INR     Status: None   Collection Time: 01/01/22  1:55 PM  Result Value Ref Range   Prothrombin Time 13.4 11.4 - 15.2 seconds   INR 1.0 0.8 - 1.2    Comment: (NOTE) INR goal varies based on device and disease states. Performed at [facility], [place] [address], [place], [place] [address]   APTT     Status: None   Collection Time: 01/01/22  1:55 PM  Result Value Ref Range   aPTT 29 24 - 36 seconds    Comment: Performed at [facility] [address] [place] [address]  CBC     Status: Abnormal   Collection Time: 01/01/22  1:55 PM  Result Value Ref Range   WBC 4.7 4.0 - 10.5 K/uL   RBC 3.69 (L) 4.22 - 5.81 MIL/uL  Hemoglobin 10.9 (L) 13.0 - 17.0 g/dL   HCT 34.7 (L) 39.0 - 52.0 %   MCV 94.0 80.0 - 100.0 fL   MCH 29.5 26.0 - 34.0 pg   MCHC 31.4 30.0 - 36.0 g/dL   RDW 13.9 11.5 - 15.5 %   Platelets 243 150 - 400 K/uL   nRBC 0.0 0.0 - 0.2 %    Comment: Performed at [facility] [address] [place], [place] [address]  Differential     Status: None   Collection Time: 01/01/22  1:55 PM  Result Value Ref Range   Neutrophils Relative % 65 %   Neutro Abs 3.0 1.7 - 7.7 K/uL   Lymphocytes Relative 21 %   Lymphs Abs 1.0 0.7 - 4.0 K/uL   Monocytes Relative 10 %   Monocytes Absolute 0.5 0.1 - 1.0 K/uL   Eosinophils Relative 4 %   Eosinophils Absolute 0.2 0.0 - 0.5 K/uL   Basophils Relative 0 %   Basophils Absolute 0.0 0.0 - 0.1 K/uL   Immature Granulocytes 0 %   Abs Immature Granulocytes 0.01 0.00 - 0.07 K/uL     Comment: Performed at [facility] [address] [place], [place] [address]  Comprehensive metabolic panel     Status: Abnormal   Collection Time: 01/01/22  1:55 PM  Result Value Ref Range   Sodium 138 135 - 145 mmol/L   Potassium 4.1 3.5 - 5.1 mmol/L   Chloride 103 98 - 111 mmol/L   CO2 27 22 - 32 mmol/L   Glucose, Bld 130 (H) 70 - 99 mg/dL    Comment: Glucose reference range applies only to samples taken after fasting for at least 8 hours.   BUN 20 8 - 23 mg/dL   Creatinine, Ser 1.70 (H) 0.61 - 1.24 mg/dL   Calcium 9.1 8.9 - 10.3 mg/dL   Total Protein 6.9 6.5 - 8.1 g/dL   Albumin 3.7 3.5 - 5.0 g/dL   AST 15 15 - 41 U/L   ALT 9 0 - 44 U/L   Alkaline Phosphatase 114 38 - 126 U/L   Total Bilirubin 0.1 (L) 0.3 - 1.2 mg/dL   GFR, Estimated 40 (L) >60 mL/min    Comment: (NOTE) Calculated using the CKD-EPI Creatinine Equation (2021)    Anion gap 8 5 - 15    Comment: Performed at [facility] [address] [place],  [address]  CBG monitoring, ED     Status: None   Collection Time: 01/01/22  7:29 PM  Result Value Ref Range   Glucose-Capillary 80 70 - 99 mg/dL    Comment: Glucose reference range applies only to samples taken after fasting for at least 8 hours.   DG Chest 1 View  Result Date: 01/01/2022 CLINICAL DATA:  Weakness, slurred speech, pre MRI EXAM: CHEST  1 VIEW COMPARISON:  CT chest dated 07/20/2017 FINDINGS: Low lung volumes with mild bibasilar atelectasis. No pleural effusion or pneumothorax. The heart is normal in size. 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 No radiopaque foreign body is seen. IMPRESSION: Low lung volumes with mild bibasilar atelectasis. Electronically Signed   By: Julian Hy M.D.   On: 01/01/2022 19:15   DG Abd 1 View  Result Date: 01/01/2022 CLINICAL DATA:  Weakness, slurred speech, pre MRI EXAM: ABDOMEN - 1 VIEW COMPARISON:  None Available. FINDINGS: Nonobstructive bowel gas pattern. Mild  degenerative changes of the lumbar spine. Penile prosthesis. No  radiopaque foreign body is seen. IMPRESSION: Negative. Electronically Signed   By: Julian Hy M.D.   On: 01/01/2022 19:12   CT HEAD WO CONTRAST  Result Date: 01/01/2022 CLINICAL DATA:  Provided history: Slurred speech. Additional history provided: Slurred speech for 5 days. EXAM: CT HEAD WITHOUT CONTRAST TECHNIQUE: Contiguous axial images were obtained from the base of the skull through the vertex without intravenous contrast. RADIATION DOSE REDUCTION: This exam was performed according to the departmental dose-optimization program which includes automated exposure control, adjustment of the mA and/or kV according to patient size and/or use of iterative reconstruction technique. COMPARISON:  Brain MRI 10/20/2018. Head CT 10/20/2014. FINDINGS: Brain: Mild-to-moderate generalized cerebral atrophy. Known chronic small-vessel infarcts within the bilateral cerebral hemispheric white matter and right thalamus. Background patchy and ill-defined hypoattenuation within the cerebral white matter, nonspecific but compatible with moderate to advanced chronic small vessel ischemic disease. Focus of Wallerian degeneration within the bilateral cerebral white matter and callosal body. There is no acute intracranial hemorrhage. No demarcated cortical infarct. No extra-axial fluid collection. No evidence of an intracranial mass. No midline shift. Vascular: No hyperdense vessel. Atherosclerotic calcifications. Skull: No fracture or aggressive osseous lesion. Sinuses/Orbits: No mass or acute finding within the imaged orbits. Minimal mucosal thickening within the bilateral frontal, ethmoid and maxillary sinuses. IMPRESSION: 1. No evidence of acute intracranial abnormality. 2. Known chronic small-vessel infarcts within the bilateral cerebral hemispheric white matter and right thalamus. 3. Background moderate to advanced cerebral white matter chronic small vessel  ischemic disease. 4. Mild-to-moderate generalized cerebral atrophy. Electronically Signed   By: Kellie Simmering D.O.   On: 01/01/2022 14:49   MR BRAIN WO CONTRAST  Result Date: 01/01/2022 CLINICAL DATA:  Slurred speech EXAM: MRI HEAD WITHOUT CONTRAST TECHNIQUE: Multiplanar, multiecho pulse sequences of the brain and surrounding structures were obtained without intravenous contrast. COMPARISON:  10/20/2018 MRI head, correlation is also made with CT head 01/01/2022 FINDINGS: Evaluation is somewhat limited by motion artifact. Brain: Small areas of of restricted diffusion with ADC correlates in the subcortical right medial frontal gyrus (series 5, image 96), left body of the corpus callosum (series 5, images 79-80), and subcortical left inferior frontal gyrus (series 5, image 78), with punctate foci in the left basal ganglia (series 5, image 70) and left cerebral peduncle/left thalamus (series 5, image 69). The left inferior frontal gyrus restricted diffusion is less intense than in the corpus callosum or right frontal gyrus, likely less acute. No acute hemorrhage, mass, mass effect, or midline shift. No hydrocephalus or extra-axial collection. 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 Lacunar infarcts in the right cerebellum, right pons, right anterior internal capsule, left posterior caudate/internal capsule, and bilateral corona radiata. No evidence of significant remote cortical infarct. Confluent T2 hyperintense signal in the periventricular white matter and pons, likely the sequela of severe chronic small vessel ischemic disease. Vascular: Normal arterial flow voids. Skull and upper cervical spine: Normal marrow signal. Sinuses/Orbits: No acute finding. Status post bilateral lens replacements. Other: The mastoids are well aerated. IMPRESSION: Acute and/or subacute infarcts in the right  medial frontal gyrus, left inferior frontal gyrus, left body of the corpus callosum, left basal ganglia, and left cerebral peduncle/thalamus. These results were called by telephone at the time of interpretation on 01/01/2022 at 9:49 pm to provider Carmin Muskrat , who verbally acknowledged these results. Electronically Signed   By: Merilyn Baba M.D.   On: 01/01/2022  21:49    Pending Labs [employer] (From admission, onward)     Start     Ordered   Signed and Held  Lipid panel  (Labs)  Tomorrow morning,   R       Comments: Fasting    Signed and Held            Vitals/Pain Today's Vitals   01/01/22 2015 01/01/22 2127 01/01/22 2128 01/01/22 2200  BP: (!) 172/71 (!) 155/77  (!) 143/83  Pulse: (!) 38 64 84 81  Resp:  '12 17 16  '$ Temp:      SpO2: 100% 94% 94% 99%  Weight:      Height:      PainSc:        Isolation Precautions No active isolations  Medications Medications  sodium chloride flush (NS) 0.9 % injection 3 mL (3 mLs Intravenous Not Given 01/01/22 1948)  insulin aspart (novoLOG) injection 0-9 Units (has no administration in time range)  aspirin chewable tablet 81 mg (has no administration in time range)    Mobility walks with device Low fall risk   Focused Assessments Neuro Assessment Handoff:  [place] screen pass? Yes  Cardiac Rhythm: Sinus bradycardia NIH Stroke Scale ( + Modified Stroke Scale Criteria)  Interval: Initial Level of Consciousness (1a.)   : Alert, keenly responsive LOC Questions (1b. )   +: Answers both questions correctly LOC Commands (1c. )   + : Performs both tasks correctly Best Gaze (2. )  +: Normal Visual (3. )  +: No visual loss Facial Palsy (4. )    : Normal symmetrical movements Motor Arm, Left (5a. )   +: No drift Motor Arm, Right (5b. )   +: No drift Motor Leg, Left (6a. )   +: No drift Motor Leg, Right (6b. )   +: No drift Limb Ataxia (7. ): Absent Sensory (8. )   +: Normal, no sensory loss Best Language (9. )   +:  Mild-to-moderate aphasia Dysarthria (10. ): Mild-to-moderate dysarthria, patient slurs at least some words and, at worst, can be understood with some difficulty Extinction/Inattention (11.)   +: No Abnormality Modified SS Total  +: 1 Complete NIHSS TOTAL: 2 Last date known well: 12/25/21   Neuro Assessment: Exceptions to WDL Neuro Checks:   Initial (01/01/22 1943)  Last Documented NIHSS Modified Score: 1 (01/01/22 1943) Has TPA been given? No If patient is a Neuro Trauma and patient is going to OR before floor call report to [facility] nurse: [phone] or [phone]   R Recommendations: See Admitting Provider Note  Report given to: Garner Gavel RN  Additional Notes:

## 2022-01-01 NOTE — Assessment & Plan Note (Signed)
-  chronic avoid nephrotoxic medications such as NSAIDs, Vanco Zosyn combo,  avoid hypotension, continue to follow renal function  

## 2022-01-01 NOTE — Subjective & Objective (Signed)
Family noted that the patient have had some trouble speaking for past 5 days and may be even prior to that Family reports slurred speech son stated that  While they were talking on the phone he was noted to have some slurred speech is unclear how long its been going on.  Otherwise no localized weakness or numbness Patient has dementia at baseline. Patient is on Plavix and Lipitor history of prior stroke.

## 2022-01-01 NOTE — Assessment & Plan Note (Signed)
-  Continue Lipitor °

## 2022-01-01 NOTE — ED Provider Notes (Signed)
[facility] EMERGENCY DEPARTMENT Provider Note   CSN: [id number] Arrival date & time: 01/01/22  1329     History  Chief Complaint  Patient presents with   dysarthria    Scott Vasquez is a 81 y.o. male.  HPI Patient presents with his son who provides the history.  Patient has dementia, level 5 caveat. Seemingly for the past week the patient has had dysarthria, the son was unaware of this until today when he saw his father. Patient's dementia is notable, but he does deny pain, weakness while acknowledging some difficulty with speech. Similarly the patient has been taking his medication as directed, and on his list Plavix is included, but not aspirin.     Home Medications Prior to Admission medications   Medication Sig Start Date End Date Taking? Authorizing Provider  ACCU-CHEK AVIVA PLUS test strip TEST BLOOD SUGAR TWICE DAILY 01/08/21   Nafziger, Tommi Rumps, NP  atorvastatin (LIPITOR) 40 MG tablet Take 1 tablet (40 mg total) by mouth daily. 08/19/21   Nafziger, Tommi Rumps, NP  Blood Glucose Monitoring Suppl (ACCU-CHEK AVIVA PLUS) w/Device KIT USE AS DIRECTED 03/25/21   Nafziger, Tommi Rumps, NP  citalopram (CELEXA) 20 MG tablet Take 1 tablet (20 mg total) by mouth daily. 05/20/21   Rondel Jumbo, PA-C  clopidogrel (PLAVIX) 75 MG tablet Take 1 tablet (75 mg total) by mouth daily. 08/19/21   Nafziger, Tommi Rumps, NP  cyanocobalamin 1000 MCG tablet Take 1,000 mcg by mouth daily.    [provider]  donepezil (ARICEPT) 10 MG tablet Take 1 tablet (10 mg total) by mouth at bedtime. 05/20/21   Rondel Jumbo, PA-C  Dulaglutide (TRULICITY) 3 WU/9.8JX SOPN Inject 3 mg as directed once a week. 11/11/21 02/09/22  Nafziger, Tommi Rumps, NP  glipiZIDE (GLUCOTROL XL) 10 MG 24 hr tablet Take 1 tablet (10 mg total) by mouth daily with breakfast. 11/11/21   Nafziger, Tommi Rumps, NP  levocetirizine (XYZAL) 5 MG tablet TAKE ONE TABLET BY MOUTH EVERY EVENING. 06/02/16   Nafziger, Tommi Rumps, NP  levothyroxine (SYNTHROID) 50  MCG tablet Take 1 tablet (50 mcg total) by mouth daily. 08/19/21   Nafziger, Tommi Rumps, NP  lisinopril (ZESTRIL) 40 MG tablet Take 1 tablet (40 mg total) by mouth daily. 08/19/21   Nafziger, Tommi Rumps, NP  memantine (NAMENDA) 10 MG tablet 1 TAB TWICE A DAY 05/20/21   Wertman, Coralee Pesa, PA-C  ONETOUCH DELICA LANCETS 91Y MISC 1 each by Other route See admin instructions. Check blood sugar twice daily 10/25/14   [provider]  oxybutynin (DITROPAN XL) 10 MG 24 hr tablet Take 1 tablet (10 mg total) by mouth at bedtime. 08/19/21   Nafziger, Tommi Rumps, NP  pioglitazone (ACTOS) 30 MG tablet Take 1 tablet (30 mg total) by mouth daily. 11/11/21   Nafziger, Tommi Rumps, NP  Vibegron (GEMTESA) 75 MG TABS Take 1 tablet by mouth daily.    [provider]      Allergies    Actos [pioglitazone] and Metformin and related    Review of Systems   Review of Systems  Unable to perform ROS: Dementia   Physical Exam Updated Vital Signs BP (!) 143/83   Pulse 81   Temp 99.1 F (37.3 C)   Resp 16   Ht 5' 10.5" (1.791 m)   Wt 90.7 kg   SpO2 99%   BMI 28.29 kg/m  Physical Exam Vitals and nursing note reviewed.  Constitutional:      General: He is not in acute distress.  Appearance: He is well-developed.  HENT:     Head: Normocephalic and atraumatic.  Eyes:     Conjunctiva/sclera: Conjunctivae normal.  Cardiovascular:     Rate and Rhythm: Normal rate and regular rhythm.  Pulmonary:     Effort: Pulmonary effort is normal. No respiratory distress.     Breath sounds: No stridor.  Abdominal:     General: There is no distension.  Skin:    General: Skin is warm and dry.  Neurological:     Mental Status: He is alert and oriented to person, place, and time.     Cranial Nerves: Dysarthria and facial asymmetry present.     Motor: Atrophy present.    ED Results / Procedures / Treatments   Labs (all labs ordered are listed, but only abnormal results are displayed) Labs Reviewed  CBC - Abnormal; Notable for  the following components:      Result Value   RBC 3.69 (*)    Hemoglobin 10.9 (*)    HCT 34.7 (*)    All other components within normal limits  COMPREHENSIVE METABOLIC PANEL - Abnormal; Notable for the following components:   Glucose, Bld 130 (*)    Creatinine, Ser 1.70 (*)    Total Bilirubin 0.1 (*)    GFR, Estimated 40 (*)    All other components within normal limits  PROTIME-INR  APTT  DIFFERENTIAL  CBG MONITORING, ED    EKG EKG Interpretation  Date/Time:  Thursday Jan 01 2022 13:36:42 EDT Ventricular Rate:  76 PR Interval:  166 QRS Duration: 76 QT Interval:  370 QTC Calculation: 416 R Axis:   31 Text Interpretation: Normal sinus rhythm Nonspecific T wave abnormality Abnormal ECG Confirmed by Carmin Muskrat [phone]) on 01/01/2022 5:52:17 PM  Radiology DG Chest 1 View  Result Date: 01/01/2022 CLINICAL DATA:  Weakness, slurred speech, pre MRI EXAM: CHEST  1 VIEW COMPARISON:  CT chest dated 07/20/2017 FINDINGS: Low lung volumes with mild bibasilar atelectasis. No pleural effusion or pneumothorax. The heart is normal in size. 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 No radiopaque foreign body is seen. IMPRESSION: Low lung volumes with mild bibasilar atelectasis. Electronically Signed   By: Julian Hy M.D.   On: 01/01/2022 19:15   DG Abd 1 View  Result Date: 01/01/2022 CLINICAL DATA:  Weakness, slurred speech, pre MRI EXAM: ABDOMEN - 1 VIEW COMPARISON:  None Available. FINDINGS: Nonobstructive bowel gas pattern. Mild degenerative changes of the lumbar spine. Penile prosthesis. No radiopaque foreign body is seen. IMPRESSION: Negative. Electronically Signed   By: Julian Hy M.D.   On: 01/01/2022 19:12   CT HEAD WO CONTRAST  Result Date: 01/01/2022 CLINICAL DATA:  Provided history: Slurred speech. Additional history provided: Slurred speech for 5 days. EXAM: CT HEAD WITHOUT CONTRAST TECHNIQUE: Contiguous axial images were obtained from the  base of the skull through the vertex without intravenous contrast. RADIATION DOSE REDUCTION: This exam was performed according to the departmental dose-optimization program which includes automated exposure control, adjustment of the mA and/or kV according to patient size and/or use of iterative reconstruction technique. COMPARISON:  Brain MRI 10/20/2018. Head CT 10/20/2014. FINDINGS: Brain: Mild-to-moderate generalized cerebral atrophy. Known chronic small-vessel infarcts within the bilateral cerebral hemispheric white matter and right thalamus. Background patchy and ill-defined hypoattenuation within the cerebral white matter, nonspecific but compatible with moderate to advanced chronic small vessel ischemic disease. Focus of Wallerian degeneration within the bilateral cerebral white matter and callosal body. There is no acute  intracranial hemorrhage. No demarcated cortical infarct. No extra-axial fluid collection. No evidence of an intracranial mass. No midline shift. Vascular: No hyperdense vessel. Atherosclerotic calcifications. Skull: No fracture or aggressive osseous lesion. Sinuses/Orbits: No mass or acute finding within the imaged orbits. Minimal mucosal thickening within the bilateral frontal, ethmoid and maxillary sinuses. IMPRESSION: 1. No evidence of acute intracranial abnormality. 2. Known chronic small-vessel infarcts within the bilateral cerebral hemispheric white matter and right thalamus. 3. Background moderate to advanced cerebral white matter chronic small vessel ischemic disease. 4. Mild-to-moderate generalized cerebral atrophy. Electronically Signed   By: Kellie Simmering D.O.   On: 01/01/2022 14:49   MR BRAIN WO CONTRAST  Result Date: 01/01/2022 CLINICAL DATA:  Slurred speech EXAM: MRI HEAD WITHOUT CONTRAST TECHNIQUE: Multiplanar, multiecho pulse sequences of the brain and surrounding structures were obtained without intravenous contrast. COMPARISON:  10/20/2018 MRI head, correlation is also  made with CT head 01/01/2022 FINDINGS: Evaluation is somewhat limited by motion artifact. Brain: Small areas of of restricted diffusion with ADC correlates in the subcortical right medial frontal gyrus (series 5, image 96), left body of the corpus callosum (series 5, images 79-80), and subcortical left inferior frontal gyrus (series 5, image 78), with punctate foci in the left basal ganglia (series 5, image 70) and left cerebral peduncle/left thalamus (series 5, image 69). The left inferior frontal gyrus restricted diffusion is less intense than in the corpus callosum or right frontal gyrus, likely less acute. No acute hemorrhage, mass, mass effect, or midline shift. No hydrocephalus or extra-axial collection. 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 Lacunar infarcts in the right cerebellum, right pons, right anterior internal capsule, left posterior caudate/internal capsule, and bilateral corona radiata. No evidence of significant remote cortical infarct. Confluent T2 hyperintense signal in the periventricular white matter and pons, likely the sequela of severe chronic small vessel ischemic disease. Vascular: Normal arterial flow voids. Skull and upper cervical spine: Normal marrow signal. Sinuses/Orbits: No acute finding. Status post bilateral lens replacements. Other: The mastoids are well aerated. IMPRESSION: Acute and/or subacute infarcts in the right medial frontal gyrus, left inferior frontal gyrus, left body of the corpus callosum, left basal ganglia, and left cerebral peduncle/thalamus. These results were called by telephone at the time of interpretation on 01/01/2022 at 9:49 pm to provider Carmin Muskrat , who verbally acknowledged these results. Electronically Signed   By: Merilyn Baba M.D.   On: 01/01/2022 21:49    Procedures Procedures    Medications Ordered in  ED Medications  sodium chloride flush (NS) 0.9 % injection 3 mL (3 mLs Intravenous Not Given 01/01/22 1948)  insulin aspart (novoLOG) injection 0-9 Units (has no administration in time range)    ED Course/ Medical Decision Making/ A&P This patient with a Hx of dementia, prior stroke, hypertension, diabetes presents to the ED for concern of dysarthria and facial asymmetry, this involves an extensive number of treatment options, and is a complaint that carries with it a high risk of complications and morbidity.    The differential diagnosis includes stroke, recrudescence of prior stroke, nerve palsy, less likely medication reaction   Social Determinants of Health:  Dementia, age  Additional history obtained:  Additional history and/or information obtained from son, notable for details of history as above   After the initial evaluation, orders, including: CT, labs from triage reviewed, MRI ordered were initiated.   Patient placed on Cardiac and Pulse-Oximetry Monitors.  The patient was maintained on a cardiac monitor.  The cardiac monitored showed an rhythm of 70 sinus normal The patient was also maintained on pulse oximetry. The readings were typically 100% room air normal   On repeat evaluation of the patient stayed the same  Lab Tests:  I personally interpreted labs.  The pertinent results include: Mild anemia, creatinine 1.7.  Imaging Studies ordered:  I independently visualized and interpreted imaging which showed CT without obvious acute findings, but MRI with subacute and acute stroke.  I discussed this with our radiologist, after reviewing the images myself   Consultations Obtained:  I requested consultation with the neurology and internal medicine,  and discussed lab and imaging findings as well as pertinent plan - they recommend: Additional advanced neuroimaging, admission for stroke work-up  Dispostion / Final MDM:  After consideration of the diagnostic results and  the patient's response to treatment, this adult male with dementia, CKD presents with dysarthria, facial asymmetry, is found to have acute and subacute stroke on MRI, consistent with his physical exam is concerning for same.  Patient's vital signs generally reassuring, and ECG here with sinus rhythm, but stroke pattern is unusual, suggestive of thromboembolic processes, per radiology.  Patient required admission for monitoring, management as above.  Final Clinical Impression(s) / ED Diagnoses Final diagnoses:  Acute CVA (cerebrovascular accident) [facility])     Carmin Muskrat, MD 01/01/22 2248

## 2022-01-01 NOTE — ED Triage Notes (Signed)
Pt arrived POV from home c/o slurred speech x1 week per family. Pt has no other complaints.

## 2022-01-02 ENCOUNTER — Encounter (HOSPITAL_COMMUNITY): Payer: Medicare HMO

## 2022-01-02 ENCOUNTER — Inpatient Hospital Stay (HOSPITAL_COMMUNITY): Payer: Medicare HMO

## 2022-01-02 DIAGNOSIS — I6389 Other cerebral infarction: Secondary | ICD-10-CM

## 2022-01-02 DIAGNOSIS — E039 Hypothyroidism, unspecified: Secondary | ICD-10-CM | POA: Diagnosis present

## 2022-01-02 DIAGNOSIS — I639 Cerebral infarction, unspecified: Secondary | ICD-10-CM

## 2022-01-02 LAB — GLUCOSE, CAPILLARY
Glucose-Capillary: 122 mg/dL — ABNORMAL HIGH (ref 70–99)
Glucose-Capillary: 134 mg/dL — ABNORMAL HIGH (ref 70–99)
Glucose-Capillary: 135 mg/dL — ABNORMAL HIGH (ref 70–99)
Glucose-Capillary: 146 mg/dL — ABNORMAL HIGH (ref 70–99)
Glucose-Capillary: 192 mg/dL — ABNORMAL HIGH (ref 70–99)
Glucose-Capillary: 86 mg/dL (ref 70–99)
Glucose-Capillary: 88 mg/dL (ref 70–99)

## 2022-01-02 LAB — LIPID PANEL
Cholesterol: 154 mg/dL (ref 0–200)
HDL: 57 mg/dL (ref >40)
LDL Cholesterol: 85 mg/dL (ref 0–99)
Total CHOL/HDL Ratio: 2.7 RATIO
Triglycerides: 61 mg/dL (ref <150)
VLDL: 12 mg/dL (ref 0–40)

## 2022-01-02 LAB — HEMOGLOBIN A1C
Hgb A1c MFr Bld: 9.3 % — ABNORMAL HIGH (ref 4.8–5.6)
Mean Plasma Glucose: 220.21 mg/dL

## 2022-01-02 LAB — TSH: TSH: 10.167 u[IU]/mL — ABNORMAL HIGH (ref 0.350–4.500)

## 2022-01-02 LAB — ECHOCARDIOGRAM COMPLETE
AR max vel: 2.2 cm2
AV Area VTI: 2.32 cm2
AV Area mean vel: 2.16 cm2
AV Mean grad: 6.3 mmHg
AV Peak grad: 12.2 mmHg
Ao pk vel: 1.74 m/s
Area-P 1/2: 3.05 cm2
Height: 70.5 in
P 1/2 time: 789 msec
S' Lateral: 2.3 cm
Weight: 3315.72 oz

## 2022-01-02 MED ORDER — SENNOSIDES-DOCUSATE SODIUM 8.6-50 MG PO TABS: 1.0000 | ORAL_TABLET | Freq: Every evening | ORAL | Status: DC | PRN

## 2022-01-02 MED ORDER — CITALOPRAM HYDROBROMIDE 10 MG PO TABS
20.0000 mg | ORAL_TABLET | Freq: Every day | ORAL | Status: DC
  Administered 2022-01-02 – 2022-01-03 (×2): 20 mg via ORAL
  Filled 2022-01-02 (×2): qty 2

## 2022-01-02 MED ORDER — SODIUM CHLORIDE 0.9 % IV SOLN: INTRAVENOUS | Status: DC

## 2022-01-02 MED ORDER — ATORVASTATIN CALCIUM 80 MG PO TABS
80.0000 mg | ORAL_TABLET | Freq: Every day | ORAL | Status: DC
  Administered 2022-01-02 – 2022-01-03 (×2): 80 mg via ORAL
  Filled 2022-01-02 (×2): qty 1

## 2022-01-02 MED ORDER — GADOBUTROL 1 MMOL/ML IV SOLN
9.0000 mL | Freq: Once | INTRAVENOUS | Status: AC | PRN
  Administered 2022-01-02: 9 mL via INTRAVENOUS

## 2022-01-02 MED ORDER — DONEPEZIL HCL 10 MG PO TABS
10.0000 mg | ORAL_TABLET | Freq: Every day | ORAL | Status: DC
  Administered 2022-01-02 (×2): 10 mg via ORAL
  Filled 2022-01-02 (×2): qty 1

## 2022-01-02 MED ORDER — ACETAMINOPHEN 325 MG PO TABS: 650.0000 mg | ORAL_TABLET | ORAL | Status: DC | PRN

## 2022-01-02 MED ORDER — ATORVASTATIN CALCIUM 40 MG PO TABS: 40.0000 mg | ORAL_TABLET | Freq: Every day | ORAL | Status: DC

## 2022-01-02 MED ORDER — CLOPIDOGREL BISULFATE 75 MG PO TABS
75.0000 mg | ORAL_TABLET | Freq: Every day | ORAL | Status: DC
  Administered 2022-01-02 – 2022-01-03 (×2): 75 mg via ORAL
  Filled 2022-01-02 (×2): qty 1

## 2022-01-02 MED ORDER — STROKE: EARLY STAGES OF RECOVERY BOOK
Freq: Once | Status: AC
  Filled 2022-01-02: qty 1

## 2022-01-02 MED ORDER — MEMANTINE HCL 10 MG PO TABS
10.0000 mg | ORAL_TABLET | Freq: Two times a day (BID) | ORAL | Status: DC
  Administered 2022-01-02 – 2022-01-03 (×4): 10 mg via ORAL
  Filled 2022-01-02 (×4): qty 1

## 2022-01-02 MED ORDER — LEVOTHYROXINE SODIUM 50 MCG PO TABS
50.0000 ug | ORAL_TABLET | Freq: Every day | ORAL | Status: DC
  Administered 2022-01-02 – 2022-01-03 (×2): 50 ug via ORAL
  Filled 2022-01-02 (×2): qty 1

## 2022-01-02 MED ORDER — ACETAMINOPHEN 160 MG/5ML PO SOLN: 650.0000 mg | ORAL | Status: DC | PRN

## 2022-01-02 MED ORDER — ACETAMINOPHEN 650 MG RE SUPP: 650.0000 mg | RECTAL | Status: DC | PRN

## 2022-01-02 NOTE — Assessment & Plan Note (Signed)
-   Check TSH continue home medications at current dose ° °

## 2022-01-02 NOTE — Evaluation (Signed)
Physical Therapy Evaluation Patient Details Name: Scott Vasquez MRN: [id number] DOB: 1940/08/31 Today's Date: 01/02/2022  History of Present Illness  81 y.o. male admitted 5/25 with slurred speech and multifocal infarcts. MRI - Acute and/or subacute infarcts in the right medial frontal gyrus,  left inferior frontal gyrus, left body of the corpus callosum, left  basal ganglia, and left cerebral peduncle/thalamus. 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 Pt lives at home with wife and reports he still drives. Pt able to walk in hall with supervision for direction without gross LOB and able to perform static standing 1 min without support. Pt states wife in good health and able to assist. Pt with decreased balance and activity tolerance who reports greatest deficit as slow speech with difficulty with word finding at times. Pt will benefit from acute therapy to maximize independence.         Recommendations for follow up therapy are one component of a multi-disciplinary discharge planning process, led by the attending physician.  Recommendations may be updated based on patient status, additional functional criteria and insurance authorization.  Follow Up Recommendations Outpatient PT    Assistance Recommended at Discharge Intermittent Supervision/Assistance  Patient can return home with the following  Assist for transportation;Direct supervision/assist for medications management;Direct supervision/assist for financial management    Equipment Recommendations None recommended by PT  Recommendations for Other Services       Functional Status Assessment Patient has had a recent decline in their functional status and/or demonstrates limited ability to make significant improvements in function in a reasonable and predictable amount of time      Precautions / Restrictions Precautions Precautions: Fall Restrictions Weight Bearing Restrictions: No      Mobility  Bed Mobility Overal bed mobility: Modified Independent                  Transfers Overall transfer level: Modified independent                      Ambulation/Gait Ambulation/Gait assistance: Supervision Gait Distance (Feet): 450 Feet Assistive device: None Gait Pattern/deviations: Step-through pattern, Decreased stride length   Gait velocity interpretation: >2.62 ft/sec, indicative of community ambulatory   General Gait Details: pt with steady gait but unable to change gait speed or perform head turns. supervision for safety and direction  Stairs Stairs: Yes Stairs assistance: Modified independent (Device/Increase time) Stair Management: Two rails, Alternating pattern, Forwards Number of Stairs: 5 General stair comments: pt performed 5 stairs safely with bil rails  Wheelchair Mobility    Modified Rankin (Stroke Patients Only)       Balance Overall balance assessment: Mild deficits observed, not formally tested                                           Pertinent Vitals/Pain Pain Assessment Pain Assessment: No/denies pain    Home Living Family/patient expects to be discharged to:: Private residence Living Arrangements: Spouse/significant other Available Help at Discharge: Family;Available 24 hours/day Type of Home: House Home Access: Stairs to enter   [employer] of Steps: 3   Home Layout: One level Home Equipment: Conservation officer, nature (2 wheels);Cane - single point      Prior Function Prior Level of Function :  Independent/Modified Independent                     Hand Dominance   Dominant Hand: Right    Extremity/Trunk Assessment   Upper Extremity Assessment Upper Extremity Assessment: Overall WFL for tasks assessed    Lower Extremity Assessment Lower Extremity Assessment:  Overall WFL for tasks assessed    Cervical / Trunk Assessment Cervical / Trunk Assessment: Kyphotic  Communication   Communication: Expressive difficulties  Cognition Arousal/Alertness: Awake/alert Behavior During Therapy: WFL for tasks assessed/performed Overall Cognitive Status: History of cognitive impairments - at baseline                                 General Comments: pt able to recall room number but walked past room and with cues to attend pt able to pause grossly 30 sec and then locate room, following commands and oriented        General Comments General comments (skin integrity, edema, etc.): HR 80-100; elevating with activity    Exercises     Assessment/Plan    PT Assessment Patient needs continued PT services  PT Problem List Decreased mobility;Decreased activity tolerance;Decreased cognition;Decreased balance       PT Treatment Interventions Gait training;Balance training;Therapeutic activities;Patient/family education;Functional mobility training    PT Goals (Current goals can be found in the Care Plan section)  Acute Rehab PT Goals Patient Stated Goal: return to yard work PT Goal Formulation: With patient Time For Goal Achievement: 01/16/22 Potential to Achieve Goals: Fair    Frequency Min 3X/week     Co-evaluation               AM-PAC PT "6 Clicks" Mobility  Outcome Measure Help needed turning from your back to your side while in a flat bed without using bedrails?: None Help needed moving from lying on your back to sitting on the side of a flat bed without using bedrails?: None Help needed moving to and from a bed to a chair (including a wheelchair)?: None Help needed standing up from a chair using your arms (e.g., wheelchair or bedside chair)?: A Little Help needed to walk in hospital room?: A Little Help needed climbing 3-5 steps with a railing? : None 6 Click Score: 22    End of Session Equipment Utilized During  Treatment: Gait belt Activity Tolerance: Patient tolerated treatment well Patient left: in chair;with call bell/phone within reach;with chair alarm set Nurse Communication: Mobility status PT Visit Diagnosis: Other abnormalities of gait and mobility (R26.89)    Time: 5929-2446 PT Time Calculation (min) (ACUTE ONLY): 26 min   Charges:   PT Evaluation $PT Eval Moderate Complexity: 1 Mod PT Treatments $Gait Training: 8-22 mins        [place] Pager: [phone] Office: [place] 01/02/2022, 10:58 AM

## 2022-01-02 NOTE — Hospital Course (Addendum)
81 y.o. male with medical history significant of hx of CVA in 2001, htn, HLD, DM2-, Dementia, CKD stage 3bBaseline creatinine 1.6-, anemia Presented with slurred speech and some trouble speaking x5 days and may be even prior to that.  He is normally on Plavix and Lipitor for prior history of stroke. Patient seen in the ED underwent CT head no acute finding KUB and chest x-ray no acute finding MRI of the brain showed acute and/or subacute infarcts in the right medial frontal gyrus, left inferior frontal gyrus, left body of the corpus callosum, left basal ganglia and left cerebral peduncle/thalamus.  neurology was consulted and patient was admitted for further management

## 2022-01-02 NOTE — Consult Note (Signed)
Neurology Consultation Reason for Consult: Multifocal strokes on MRI Requesting Physician: Toy Baker  CC: slurred speech  History is obtained from: Chart review, family not reachable overnight, limited from patient  HPI: Scott Vasquez is a 81 y.o. male with prior strokes (residual left hand numbness/tingling), hypertension, hyperlipidemia (uncontrolled with LDL 180 in January), diabetes (uncontrolled with A1c 11.2% last month), vascular dementia on donepezil and memantine, hypothyroidism  He was last seen by neurology/06/2022 at which time he noted that he was still driving short distances always accompanied by his wife but getting tired and having to pull the car off the road to rest, showing less interest in mowing the grass or ambulating, spending most of his day watching TV and not participating in activities outside the house with his wife managing medications as well as finances, laying out his clothes for him although he is independent in being able to use the shower he has to be reminded to do so and his clothes are set out for him by his wife.  He does not cook and has been having some issues with incontinence  History is provided by the patient is fairly unreliable especially with the added difficulty of producing speech.  That said he denies any acute complaints other than his speech issues at this time.  LKW: Unclear, at least several days prior to admission tPA given?: No, out of the window Premorbid modified rankin scale:      3 - Moderate disability. Requires some help, but able to walk unassisted.  ROS: Unable to obtain due to baseline dementia  Past Medical History:  Diagnosis Date   Anemia    iron deficiency   Diabetes mellitus    type II   Diverticulosis of colon    Hypertension    Stroke ([place])    numbness in left hand   Thyroid nodule    Past Surgical History:  Procedure Laterality Date   BACK SURGERY     CATARACT EXTRACTION, BILATERAL     SHOULDER  SURGERY  08/2007   Supple   THYROID LOBECTOMY Left 04/29/2015   THYROID LOBECTOMY Left 04/29/2015   Procedure: THYROID LOBECTOMY;  Surgeon: Armandina Gemma, MD;  Location: [facility];  Service: General;  Laterality: Left;   Current Outpatient Medications  Medication Instructions   ACCU-CHEK AVIVA PLUS test strip TEST BLOOD SUGAR TWICE DAILY   atorvastatin (LIPITOR) 40 mg, Oral, Daily   Blood Glucose Monitoring Suppl (ACCU-CHEK AVIVA PLUS) w/Device KIT USE AS DIRECTED   citalopram (CELEXA) 20 mg, Oral, Daily   clopidogrel (PLAVIX) 75 mg, Oral, Daily   cyanocobalamin 1,000 mcg, Oral, Daily   donepezil (ARICEPT) 10 mg, Oral, Daily at bedtime   Gemtesa 75 mg, Oral, Daily   glipiZIDE (GLUCOTROL XL) 10 mg, Oral, Daily with breakfast   levocetirizine (XYZAL) 5 MG tablet TAKE ONE TABLET BY MOUTH EVERY EVENING.   levothyroxine (SYNTHROID) 50 mcg, Oral, Daily   lisinopril (ZESTRIL) 40 mg, Oral, Daily   memantine (NAMENDA) 10 MG tablet 1 TAB TWICE A DAY   ONETOUCH DELICA LANCETS 30S MISC 1 each, Other, See admin instructions, Check blood sugar twice daily   oxybutynin (DITROPAN XL) 10 mg, Oral, Daily at bedtime   pioglitazone (ACTOS) 30 mg, Oral, Daily   Trulicity 3 mg, Injection, Weekly     Family History  Problem Relation Age of Onset   Diabetes Other    Diabetes Father        Died from diabetic coma in September 28, 1970  Hypertension Father    Stroke Mother        Died in 69   Hypertension Mother     Social History:  reports that he quit smoking about 63 years ago. His smoking use included cigarettes. He started smoking about 70 years ago. He has a 10.00 pack-year smoking history. He has never used smokeless tobacco. He reports that he does not drink alcohol and does not use drugs.   Exam: Current vital signs: BP (!) 155/76 (BP Location: Left Arm)   Pulse 81   Temp 99.2 F (37.3 C) (Oral)   Resp 20   Ht 5' 10.5" (1.791 m)   Wt 94 kg   SpO2 99%   BMI 29.31 kg/m  Vital signs in last 24  hours: Temp:  [99.1 F (37.3 C)-99.2 F (37.3 C)] 99.2 F (37.3 C) (05/26 0030) Pulse Rate:  [38-94] 81 (05/26 0030) Resp:  [12-20] 20 (05/26 0030) BP: (143-194)/(71-98) 155/76 (05/26 0030) SpO2:  [94 %-100 %] 99 % (05/26 0030) Weight:  [90.7 kg-94 kg] 94 kg (05/26 0030)   Physical Exam  Constitutional: Appears well-developed and well-nourished.  Psych: Affect appropriate to situation, calm and cooperative, occasionally mildly frustrated Eyes: No scleral injection HENT: No oropharyngeal obstruction.  MSK: no joint deformities.  Cardiovascular: Perfusing extremities well Respiratory: Effort normal, non-labored breathing GI: Soft.  No distension. There is no tenderness.  Skin: Warm dry and intact visible skin  Neuro: Mental Status: Patient is awake, alert, oriented to person, place, that he is here for slurred speech, but disoriented to time (reports he is 81 instead of 80, cannot state the month) Patient is unable to give significant history Productive greater than receptive aphasia with relatively spared repetition although repetition is impaired as well Cranial Nerves: II: Visual Fields are full. Pupils are equal, bilaterally irregular/postsurgical but reactive to light.  III,IV, VI: EOMI without ptosis or diploplia.  V: Facial sensation is symmetric to temperature VII: Facial movement is notable for a mild right facial droop VIII: hearing is intact to voice X: Uvula elevates symmetrically XI: Shoulder shrug is symmetric. XII: tongue is midline without atrophy or fasciculations.  Motor: Tone is normal. Bulk is normal. 5/5 strength was present in all four extremities.  He did have some left upper extremity pronation and very slight drift Sensory: Sensation is symmetric to light touch and temperature in the arms and legs.  Reports he no longer has the left hand numbness that had been reported in records as residual from prior stroke Deep Tendon Reflexes: Challenging to assess  given inability to relax  Cerebellar: FNF is normal and HKS is mildly ataxic bilaterally Gait:  Deferred  NIHSS total 8 Score breakdown: 2 points for incorrectly stating his age and month, one-point for right facial droop, one-point for left upper extremity weakness, 2 points for bilateral lower extremity ataxia, one-point for mild to moderate aphasia, one-point for mild dysarthria Performed at 4:30 AM   I have reviewed labs in epic and the results pertinent to this consultation are:  Basic Metabolic Panel: Recent Labs  Lab 01/01/22 1355  NA 138  K 4.1  CL 103  CO2 27  GLUCOSE 130*  BUN 20  CREATININE 1.70*  CALCIUM 9.1  GFR 40  CBC: Recent Labs  Lab 01/01/22 1355  WBC 4.7  NEUTROABS 3.0  HGB 10.9*  HCT 34.7*  MCV 94.0  PLT 243  Hemoglobin is at his baseline  Coagulation Studies: Recent Labs    01/01/22 1355  LABPROT 13.4  INR 1.0    Lab Results  Component Value Date   HGBA1C 11.2 (A) 11/11/2021   Lab Results  Component Value Date   CHOL 259 (H) 08/19/2021   HDL 65.50 08/19/2021   LDLCALC 180 (H) 08/19/2021   TRIG 68.0 08/19/2021   CHOLHDL 4 08/19/2021   Unresulted Labs (From admission, onward)     Start     Ordered   01/02/22 0500  Lipid panel  (Labs)  Tomorrow morning,   R       Comments: Fasting    01/02/22 0048   01/02/22 0022  TSH  Add-on,   AD        01/02/22 0021             I have reviewed the images obtained:  MRI brain personally reviewed, agree with radiology Acute and/or subacute infarcts in the right medial frontal gyrus, left inferior frontal gyrus, left body of the corpus callosum, left basal ganglia, and left cerebral peduncle/thalamus.   Impression: This is an 81 year old man with multiple uncontrolled vascular risk factors presenting with multifocal subcortical strokes.  Etiology is most likely uncontrolled small vessel disease, but given the distribution in multiple vascular territories, possible central embolic  source needs to be considered as well  Recommendations: #Multifocal subcortical strokes, small vessel disease versus cardioembolic or secondary to aortic arch atherosclerosis - Stroke labs HgbA1c completed recently and well outside of goal (11.2% measured, goal less than 7%),  - fasting lipid panel, goal LDL less than 70 - MRA of the brain without contrast and MRA neck w/wo  - Frequent neuro checks - Echocardiogram - Add aspirin 81 mg daily to home Plavix 75 mg daily - Risk factor modification - Telemetry monitoring; 30 day event monitor on discharge if no arrythmias captured  - Blood pressure goal   - Normotension to be achieved gradually over 3 to 5 days given patient is outside of window for permissive hypertension - PT consult, OT consult, Speech consult, unless patient is back to baseline - Stroke team to follow  [place] [phone] Available 7 PM to 7 AM, outside of these hours please call Neurologist on call as listed on Amion.

## 2022-01-02 NOTE — Progress Notes (Addendum)
STROKE TEAM PROGRESS NOTE   INTERVAL HISTORY Scott Vasquez is seen in his room with no family at the bedside.  He states that he experienced acute onset aphasia with difficulties in word finding and slurred speech prior to admission.  MRI shows acute infarcts in the right medial frontal gyrus, left inferior frontal gyrus, left body of the corpus callosum, left basal ganglia and left cerebral peduncle. MR angiogram of the brain and neck does not show any large vessel stenosis or occlusion.  LDL cholesterol is 180 mg percent.  Echocardiogram is pending Vitals:   01/02/22 0030 01/02/22 0359 01/02/22 0846 01/02/22 0931  BP: (!) 155/76 (!) 142/76 (!) 146/81   Pulse: 81 66 65 72  Resp: '20 20 20   '$ Temp: 99.2 F (37.3 C) 98.9 F (37.2 C) 98.1 F (36.7 C)   TempSrc: Oral Oral Oral   SpO2: 99% 100% 100%   Weight: 94 kg     Height:       CBC:  Recent Labs  Lab 01/01/22 1355  WBC 4.7  NEUTROABS 3.0  HGB 10.9*  HCT 34.7*  MCV 94.0  PLT 542   Basic Metabolic Panel:  Recent Labs  Lab 01/01/22 1355  NA 138  K 4.1  CL 103  CO2 27  GLUCOSE 130*  BUN 20  CREATININE 1.70*  CALCIUM 9.1   Lipid Panel:  Recent Labs  Lab 01/02/22 0426  CHOL 154  TRIG 61  HDL 57  CHOLHDL 2.7  VLDL 12  LDLCALC 85   HgbA1c:  Recent Labs  Lab 01/02/22 0426  HGBA1C 9.3*   Urine Drug Screen: No results for input(s): LABOPIA, COCAINSCRNUR, LABBENZ, AMPHETMU, THCU, LABBARB in the last 168 hours.  Alcohol Level No results for input(s): ETH in the last 168 hours.  IMAGING past 24 hours DG Chest 1 View  Result Date: 01/01/2022 CLINICAL DATA:  Weakness, slurred speech, pre MRI EXAM: CHEST  1 VIEW COMPARISON:  CT chest dated 07/20/2017 FINDINGS: Low lung volumes with mild bibasilar atelectasis. No pleural effusion or pneumothorax. The heart is normal in size. 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 No radiopaque foreign body is seen. IMPRESSION: Low lung volumes with mild bibasilar  atelectasis. Electronically Signed   By: Julian Hy M.D.   On: 01/01/2022 19:15   DG Abd 1 View  Result Date: 01/01/2022 CLINICAL DATA:  Weakness, slurred speech, pre MRI EXAM: ABDOMEN - 1 VIEW COMPARISON:  None Available. FINDINGS: Nonobstructive bowel gas pattern. Mild degenerative changes of the lumbar spine. Penile prosthesis. No radiopaque foreign body is seen. IMPRESSION: Negative. Electronically Signed   By: Julian Hy M.D.   On: 01/01/2022 19:12   CT HEAD WO CONTRAST  Result Date: 01/01/2022 CLINICAL DATA:  Provided history: Slurred speech. Additional history provided: Slurred speech for 5 days. EXAM: CT HEAD WITHOUT CONTRAST TECHNIQUE: Contiguous axial images were obtained from the base of the skull through the vertex without intravenous contrast. RADIATION DOSE REDUCTION: This exam was performed according to the departmental dose-optimization program which includes automated exposure control, adjustment of the mA and/or kV according to Scott Vasquez size and/or use of iterative reconstruction technique. COMPARISON:  Brain MRI 10/20/2018. Head CT 10/20/2014. FINDINGS: Brain: Mild-to-moderate generalized cerebral atrophy. Known chronic small-vessel infarcts within the bilateral cerebral hemispheric white matter and right thalamus. Background patchy and ill-defined hypoattenuation within the cerebral white matter, nonspecific but compatible with moderate to advanced chronic small vessel ischemic disease. Focus of Wallerian degeneration within the bilateral cerebral  white matter and callosal body. There is no acute intracranial hemorrhage. No demarcated cortical infarct. No extra-axial fluid collection. No evidence of an intracranial mass. No midline shift. Vascular: No hyperdense vessel. Atherosclerotic calcifications. Skull: No fracture or aggressive osseous lesion. Sinuses/Orbits: No mass or acute finding within the imaged orbits. Minimal mucosal thickening within the bilateral frontal,  ethmoid and maxillary sinuses. IMPRESSION: 1. No evidence of acute intracranial abnormality. 2. Known chronic small-vessel infarcts within the bilateral cerebral hemispheric white matter and right thalamus. 3. Background moderate to advanced cerebral white matter chronic small vessel ischemic disease. 4. Mild-to-moderate generalized cerebral atrophy. Electronically Signed   By: Kellie Simmering D.O.   On: 01/01/2022 14:49   MR ANGIO HEAD WO CONTRAST  Result Date: 01/02/2022 CLINICAL DATA:  Stroke workup EXAM: MRA HEAD WITHOUT CONTRAST TECHNIQUE: Angiographic images of the Circle of Willis were acquired using MRA technique without intravenous contrast. COMPARISON:  10/20/2014 FINDINGS: Anterior circulation: Tortuous ICA below the skull base. Azygous A2 segment. No branch occlusion, beading, or flow limiting stenosis. Negative for aneurysm. Posterior circulation: Mild dominance of the left vertebral artery. The vertebral and basilar arteries are smoothly contoured and diffusely patent. Advanced right P3 segment stenosis. Negative for aneurysm or beading. Anatomic variants: As above IMPRESSION: 1. No emergent finding. 2. 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 Electronically Signed   By: Jorje Guild M.D.   On: 01/02/2022 07:31   MR ANGIO NECK W WO CONTRAST  Result Date: 01/02/2022 CLINICAL DATA:  Stroke, determine embolic source EXAM: MRA NECK WITHOUT AND WITH CONTRAST TECHNIQUE: Multiplanar and multiecho pulse sequences of the neck were obtained without and with intravenous contrast. Angiographic images of the neck were obtained using MRA technique without and with intravenous contrast. CONTRAST:  1m GADAVIST GADOBUTROL 1 MMOL/ML IV SOLN COMPARISON:  None Available. FINDINGS: Antegrade flow in the carotid and vertebral arteries where covered on time-of-flight imaging. No gross abnormality on neck mask. Unremarkable covered portions of the aortic arch. No proximal subclavian stenosis.  The vertebral arteries are somewhat tortuous but smoothly contoured and diffusely patent. Bilateral ICA tortuosity below the skull base. No carotid circulation stenosis or ulceration. IMPRESSION: Unremarkable for age.  No stenosis or visible embolic source. Electronically Signed   By: JJorje GuildM.D.   On: 01/02/2022 07:33   MR BRAIN WO CONTRAST  Result Date: 01/01/2022 CLINICAL DATA:  Slurred speech EXAM: MRI HEAD WITHOUT CONTRAST TECHNIQUE: Multiplanar, multiecho pulse sequences of the brain and surrounding structures were obtained without intravenous contrast. COMPARISON:  10/20/2018 MRI head, correlation is also made with CT head 01/01/2022 FINDINGS: Evaluation is somewhat limited by motion artifact. Brain: Small areas of of restricted diffusion with ADC correlates in the subcortical right medial frontal gyrus (series 5, image 96), left body of the corpus callosum (series 5, images 79-80), and subcortical left inferior frontal gyrus (series 5, image 78), with punctate foci in the left basal ganglia (series 5, image 70) and left cerebral peduncle/left thalamus (series 5, image 69). The left inferior frontal gyrus restricted diffusion is less intense than in the corpus callosum or right frontal gyrus, likely less acute. No acute hemorrhage, mass, mass effect, or midline shift. No hydrocephalus or extra-axial collection. 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 Lacunar infarcts in the right cerebellum, right pons, right anterior internal capsule, left posterior caudate/internal capsule, and bilateral corona radiata. No evidence of significant remote cortical  infarct. Confluent T2 hyperintense signal in the periventricular white matter and pons, likely the sequela of severe chronic small vessel ischemic disease. Vascular: Normal arterial flow voids. Skull and upper cervical spine:  Normal marrow signal. Sinuses/Orbits: No acute finding. Status post bilateral lens replacements. Other: The mastoids are well aerated. IMPRESSION: Acute and/or subacute infarcts in the right medial frontal gyrus, left inferior frontal gyrus, left body of the corpus callosum, left basal ganglia, and left cerebral peduncle/thalamus. These results were called by telephone at the time of interpretation on 01/01/2022 at 9:49 pm to provider Carmin Muskrat , who verbally acknowledged these results. Electronically Signed   By: Merilyn Baba M.D.   On: 01/01/2022 21:49    PHYSICAL EXAM General:  Alert, well-nourished, well-developed elderly African-American male Scott Vasquez in no acute distress Respiratory:  Regular, unlabored respirations on room air  NEURO:  Mental Status: AA&Ox3 1/3 registration, 0/3 recall.  Able to name 4 animals with four feet.  Impaired attention, comprehension and registration. Speech/Language: speech is with moderate dysarthria. Naming intact but repetition impaired.  Speech nonfluent  Cranial Nerves:  II: PERRL. Visual fields full.  III, IV, VI: EOMI. Eyelids elevate symmetrically.  V: Sensation is intact to light touch and symmetrical to face.  VII: Smile is symmetrical.  VIII: hearing intact to voice. IX, X: Phonation is normal.  XII: tongue is midline without fasciculations. Motor: 5/5 strength to all muscle groups tested.  Sensation- Intact to light touch bilaterally.  Coordination: FTN intact bilaterally.No drift.  Gait- deferred   ASSESSMENT/PLAN Scott Vasquez is a 81 y.o. male with history of stroke, vascular dementia, HTN, HLD and DM presenting with acute onset aphasia with difficulties in word finding and slurred speech.  MRI shows acute infarcts in the right medial frontal gyrus, left inferior frontal gyrus, left body of the corpus callosum, left basal ganglia and left cerebral peduncle.  Stroke:  bilateral multiple infarcts in right medial frontal gyrus, left  inferior frontal gyrus, left corpus callosum, left basal ganglia and left cerebral peduncle infarct  likely secondary due to embolism from cryptogenic source CT head No acute abnormality. Small vessel disease. Atrophy.  MRI  acute or subacute infarcts in the right medial frontal gyrus, left inferior frontal gyrus, left body of the corpus callosum, left basal ganglia and left cerebral peduncle MRA  no emergent finding, right P3 stenosis, bilateral ICA tortuosity Carotid Doppler  not needed. See MRA 2D Echo pending LDL 85 HgbA1c 9.3 VTE prophylaxis - SCDs    Diet   Diet Carb Modified Fluid consistency: Thin; Room service appropriate? Yes   No antithrombotic prior to admission, now on aspirin 81 mg daily and clopidogrel 75 mg daily.  For 3 weeks followed by aspirin alone Consider loop recorder or 30 day cardiac monitor to rule out atrial fibrillation Therapy recommendations:  outpatient PT Disposition:  pending, likely home  Hypertension Home meds:  lisinopril 40 mg daily Stable Permissive hypertension (OK if < 220/120) but gradually normalize in 5-7 days Long-term BP goal normotensive  Hyperlipidemia Home meds:  atorvastatin 40 mg daily, increased to 80 mg daily LDL 85, goal < 70 Continue statin at discharge  Diabetes type II Uncontrolled Home meds:  glipzide 10 mg XR daily, pioglitazone 30 mg daily HgbA1c 9.3, goal < 7.0 CBGs Recent Labs    01/02/22 0052 01/02/22 0402 01/02/22 0848  GLUCAP 135* 86 88   Diabetes coordinator consult SSI  Other Stroke Risk Factors Advanced Age >/= 42  Former cigarette smoker Hx  stroke  Other Active Problems Mild dementia Signs of cognitive impairment on exam Scott Vasquez states he lives at home with support from wife Continue home donepezil and memantine  Hospital day # [place] , MSN, AGACNP-BC [facility] See Amion for schedule and pager information 01/02/2022 12:12 PM    STROKE MD NOTE :  I have  personally obtained history,examined this Scott Vasquez, reviewed notes, independently viewed imaging studies, participated in medical decision making and plan of care.ROS completed by me personally and pertinent positives fully documented  I have made any additions or clarifications directly to the above note. Agree with note above.  Scott Vasquez presented with speech difficulties secondary to bicerebral embolic infarcts undetermined source likely strong possibility of paroxysmal A-fib.  Recommend continue ongoing stroke work-up.  Aspirin and Plavix for 3 weeks followed by aspirin alone.  Aggressive risk factor modification.  May need loop recorder at discharge to look for paroxysmal A-fib.  Discussed with Scott Vasquez and answered questions.  Discussed with Dr.Ramesh.  Greater than 50% time during this 50-minute visit was spent in counseling and coordination of care. about his embolic strokes and discussion about stroke evaluation, prevention and treatment and answering questions.  Antony Contras, MD Medical Director [place] Pager: [phone] 01/02/2022 1:03 PM   To contact Stroke Continuity provider, please refer to [email or web address] After hours, contact General Neurology

## 2022-01-02 NOTE — Evaluation (Signed)
Occupational Therapy Evaluation Patient Details Name: Scott Vasquez MRN: [id number] DOB: Sep 10, 1940 Today's Date: 01/02/2022   History of Present Illness 81 y.o. male admitted 5/25 with slurred speech and multifocal infarcts. MRI - Acute and/or subacute infarcts in the right medial frontal gyrus,  left inferior frontal gyrus, left body of the corpus callosum, left  basal ganglia, and left cerebral peduncle/thalamus. 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  Currently, pt requiring Supervision-Min Guard A for ADLs and functional mobility. Pt presenting near baseline with decreased processing and awareness. Noting decreased expressive communication. Pt would benefit from further acute OT to facilitate safe dc. Recommend dc to home once medically stable per physician with support from family.     Recommendations for follow up therapy are one component of a multi-disciplinary discharge planning process, led by the attending physician.  Recommendations may be updated based on patient status, additional functional criteria and insurance authorization.   Follow Up Recommendations  No OT follow up    Assistance Recommended at Discharge Frequent or constant Supervision/Assistance  Patient can return home with the following A little help with bathing/dressing/bathroom    Functional Status Assessment  Patient has had a recent decline in their functional status and demonstrates the ability to make significant improvements in function in a reasonable and predictable amount of time.  Equipment Recommendations  None recommended by OT    Recommendations for Other Services       Precautions / Restrictions Precautions Precautions: Fall Restrictions Weight Bearing Restrictions: No      Mobility Bed  Mobility               General bed mobility comments: Sitting in recliner    Transfers Overall transfer level: Needs assistance Equipment used: None Transfers: Sit to/from Stand Sit to Stand: Min guard           General transfer comment: Min Gaurd A for safety      Balance Overall balance assessment: Needs assistance Sitting-balance support: Feet supported, No upper extremity supported Sitting balance-Leahy Scale: Good     Standing balance support: No upper extremity supported, During functional activity Standing balance-Leahy Scale: Good                             ADL either performed or assessed with clinical judgement   ADL Overall ADL's : Needs assistance/impaired Eating/Feeding: Set up;Sitting   Grooming: Wash/dry face;Min guard;Standing Grooming Details (indicate cue type and reason): Min Guard A for safety. No phsyical A needed Upper Body Bathing: Supervision/ safety;Set up;Sitting   Lower Body Bathing: Min guard;Sit to/from stand   Upper Body Dressing : Supervision/safety;Set up;Sitting   Lower Body Dressing: Min guard;Sit to/from stand   Toilet Transfer: Min guard;Ambulation (simulated to recliner)   Toileting- Clothing Manipulation and Hygiene: Min guard;Sit to/from stand       Functional mobility during ADLs: Min guard General ADL Comments: Pt performing grooming at sink and functional mobiltiy in room. Demonstrating near baseline function with slight decrease in cognition and balance. Very motivated and agreeable to participate     Vision   Additional Comments: Denied any diplopia and blurry vision     Perception     Praxis      Pertinent Vitals/Pain Pain Assessment Pain  Assessment: Faces Faces Pain Scale: No hurt Pain Intervention(s): Monitored during session     Hand Dominance Right   Extremity/Trunk Assessment Upper Extremity Assessment Upper Extremity Assessment: Overall WFL for tasks assessed   Lower  Extremity Assessment Lower Extremity Assessment: Overall WFL for tasks assessed   Cervical / Trunk Assessment Cervical / Trunk Assessment: Kyphotic   Communication Communication Communication: Expressive difficulties   Cognition Arousal/Alertness: Awake/alert Behavior During Therapy: WFL for tasks assessed/performed Overall Cognitive Status: History of cognitive impairments - at baseline                                 General Comments: Baseline dementia. Able to follow commands and maintain basic conversation. Requiring increased time for processing. Did not difficulty with production of more complex sentence. Oriented and able to discribe what happened and why.     General Comments  HR 80-100; elevating with activity    Exercises     Shoulder Instructions      Home Living Family/patient expects to be discharged to:: Private residence Living Arrangements: Spouse/significant other Available Help at Discharge: Family;Available 24 hours/day Type of Home: House Home Access: Stairs to enter [employer] of Steps: 3   Home Layout: One level     Bathroom Shower/Tub: Teacher, early years/pre: Handicapped height     Home Equipment: Conservation officer, nature (2 wheels);Cane - single point          Prior Functioning/Environment Prior Level of Function : Independent/Modified Independent             Mobility Comments: No AD ADLs Comments: Performs BADLs. Wife reports she assists with IADLs.        OT Problem List: Decreased activity tolerance;Impaired balance (sitting and/or standing);Decreased knowledge of use of DME or AE;Decreased knowledge of precautions      OT Treatment/Interventions: Self-care/ADL training;Therapeutic exercise;Energy conservation;DME and/or AE instruction;Therapeutic activities;Patient/family education    OT Goals(Current goals can be found in the care plan section) Acute Rehab OT Goals Patient Stated Goal: Go home OT  Goal Formulation: With patient Time For Goal Achievement: 01/16/22 Potential to Achieve Goals: Good  OT Frequency: Min 2X/week    Co-evaluation              AM-PAC OT "6 Clicks" Daily Activity     Outcome Measure Help from another person eating meals?: None Help from another person taking care of personal grooming?: A Little Help from another person toileting, which includes using toliet, bedpan, or urinal?: A Little Help from another person bathing (including washing, rinsing, drying)?: A Little Help from another person to put on and taking off regular upper body clothing?: A Little Help from another person to put on and taking off regular lower body clothing?: A Little 6 Click Score: 19   End of Session Equipment Utilized During Treatment: Gait belt Nurse Communication: Mobility status  Activity Tolerance: Patient tolerated treatment well Patient left: in chair;with call bell/phone within reach;with chair alarm set  OT Visit Diagnosis: Unsteadiness on feet (R26.81);Other abnormalities of gait and mobility (R26.89);Muscle weakness (generalized) (M62.81)                Time: 1014-1030 OT Time Calculation (min): 16 min Charges:  OT General Charges $OT Visit: 1 Visit OT Evaluation $OT Eval Moderate Complexity: [place], OTR/L Acute Rehab Pager: [phone] Office: [place] 01/02/2022, 11:03 AM

## 2022-01-02 NOTE — Progress Notes (Signed)
Inpatient Diabetes Program Recommendations  AACE/ADA: New Consensus Statement on Inpatient Glycemic Control (2015)  Target Ranges:  Prepandial:   less than 140 mg/dL      Peak postprandial:   less than 180 mg/dL (1-2 hours)      Critically ill patients:  140 - 180 mg/dL   Lab Results  Component Value Date   GLUCAP 134 (H) 01/02/2022   HGBA1C 9.3 (H) 01/02/2022    Review of Glycemic Control  Latest Reference Range & Units 01/02/22 00:52 01/02/22 04:02 01/02/22 08:48 01/02/22 12:30  Glucose-Capillary 70 - 99 mg/dL 135 (H) 86 88 134 (H)  Diabetes history: DM 2 Outpatient Diabetes medications:  Trulicity 3 mg weekly Glipizide XL- 10 mg daily with breakfast Actos 30 mg daily Current orders for Inpatient glycemic control:  Novolog sensitive q 4 hours  Inpatient Diabetes Program Recommendations:    Referral received for uncontrolled DM.  Per chart review, patient's A1C has improved since April of 2023.  PCP started Trulicity weekly at that visit.   No further recs. At this time.  Glycemic management has improved and will follow.   Thanks,  Adah Perl, RN, BC-ADM Inpatient Diabetes Coordinator Pager [phone]  (8a-5p)

## 2022-01-02 NOTE — Telephone Encounter (Signed)
Called pt daughter on number provided on from to call [phone]. However, when I called this time the receiver stated that Marzetta Board was not in at the moment. PPW will be placed in front office filing cabinet.

## 2022-01-02 NOTE — Plan of Care (Signed)
  Problem: Coping: Goal: Will verbalize positive feelings about self Outcome: Progressing

## 2022-01-02 NOTE — Progress Notes (Signed)
PROGRESS NOTE Scott Vasquez  YTK:[id number] DOB: 1940/11/12 DOA: 01/01/2022 PCP: [name], NP   Brief Narrative/Hospital Course: 81 y.o. male with medical history significant of hx of CVA in 2001, htn, HLD, DM2-, Dementia, CKD stage 3bBaseline creatinine 1.6-, anemia Presented with slurred speech and some trouble speaking x5 days and may be even prior to that.  He is normally on Plavix and Lipitor for prior history of stroke. Patient seen in the ED underwent CT head no acute finding KUB and chest x-ray no acute finding MRI of the brain showed acute and/or subacute infarcts in the right medial frontal gyrus, left inferior frontal gyrus, left body of the corpus callosum, left basal ganglia and left cerebral peduncle/thalamus.  neurology was consulted and patient was admitted for further management    Subjective: Seen examined Alert awake oriented to self, place, says he is here due to speech problem. Says he lives with his wife   Assessment and Plan: Principal Problem:   Stroke [facility]) Active Problems:   Hyperlipidemia   Iron deficiency anemia   Essential hypertension   Stroke with cerebral ischemia ([place])   Uncontrolled type 2 diabetes mellitus with hyperglycemia (HCC)   Moderate dementia with behavioral disturbance (HCC)   CKD (chronic kidney disease), stage III (HCC)   Hypothyroidism    Multifocal subcortical strokes, small vessel disease versus cardioembolic or secondary to Aortic arch atherosclerosis: Appreciate neurology input completing stroke work-up-LDL 180-increase lipitor to '80mg'$ .TSH 10.1 checking free T4 hemoglobin A1c recently in April 11.2.MRA of the brain without contrast and MRA neck with and without-"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  Hyperlipidemia:LDL 180, GOAL <70, Cont lipitor. Iron deficiency anemia-hb stable, monitor Recent Labs  Lab 01/01/22 1355  HGB 10.9*  HCT 34.7*    Essential hypertension- BP borderline, normotension is goal in 3-5 days,  resume lisinopril in am 1-2 days  Uncontrolled type 2 diabetes mellitus with hyperglycemia :recent hba1c 11.3- cont  ssi, resume glucotorl 10 and trulicity on dc. If sugar poorly controlled, will consider lantus daily and dc OHA. Recent Labs  Lab 01/01/22 1929 01/02/22 0052 01/02/22 0402 01/02/22 0848  GLUCAP 80 135* 86 88    Moderate dementia with behavioral disturbance - is alert awake communicative, follows command, cont supportive care, fall precautions  CKD stage IIIB-at baseline Hypothyroidism- tsh up in 10- check ft4.on synthroid 50 mcg  DVT prophylaxis: SCD's Start: 01/02/22 0049 Code Status:   Code Status: Full Code Family Communication: plan of care discussed with patient/called wife on phone Patient status is: inpatient because of for ongoing stroke work up Level of care: Telemetry Medical   Dispo: The patient is from: home            Anticipated disposition: TBD in 1-2 days  Mobility Assessment (last 72 hours)     Mobility Assessment     Row Name 01/02/22 0203           Does patient have an order for bedrest or is patient medically unstable No - Continue assessment       What is the highest level of mobility based on the progressive mobility assessment? Level 5 (Walks with assist in room/hall) - Balance while stepping forward/back and can walk in room with assist - Complete  Objective: Vitals last 24 hrs: Vitals:   01/01/22 2300 01/02/22 0030 01/02/22 0359 01/02/22 0846  BP: (!) 168/95 (!) 155/76 (!) 142/76 (!) 146/81  Pulse: 94 81 66 65  Resp: '16 20 20 20  '$ Temp:  99.2 F (37.3 C) 98.9 F (37.2 C) 98.1 F (36.7 C)  TempSrc:  Oral Oral Oral  SpO2: 96% 99% 100% 100%  Weight:  94 kg     Height:       Weight change:   Physical Examination: General exam: alert awake, or x2, older than stated age, weak appearing. HEENT:Oral mucosa moist, Ear/Nose WNL grossly, dentition normal. Respiratory system: bilaterally diminished BS, no use of accessory muscle Cardiovascular system: S1 & S2 +, No JVD. Gastrointestinal system: Abdomen soft,NT,ND, BS+ Nervous System:Alert, awake, moving extremities and grossly nonfocal Extremities: LE edema ,distal peripheral pulses palpable.  Skin: No rashes,no icterus. MSK: Normal muscle bulk,tone, power  Medications reviewed:  Scheduled Meds:  aspirin  81 mg Oral Daily   atorvastatin  80 mg Oral Daily   citalopram  20 mg Oral Daily   clopidogrel  75 mg Oral Daily   donepezil  10 mg Oral QHS   insulin aspart  0-9 Units Subcutaneous Q4H   levothyroxine  50 mcg Oral Q0600   memantine  10 mg Oral BID   sodium chloride flush  3 mL Intravenous Once   Continuous Infusions:  sodium chloride 75 mL/hr at 01/02/22 0140      Diet Order             Diet Carb Modified Fluid consistency: Thin; Room service appropriate? Yes  Diet effective now                            Intake/Output Summary (Last 24 hours) at 01/02/2022 0904 Last data filed at 01/02/2022 0500 Gross per 24 hour  Intake 243.62 ml  Output 200 ml  Net 43.62 ml   Net IO Since Admission: 43.62 mL [01/02/22 0904]  Wt Readings from Last 3 Encounters:  01/02/22 94 kg  11/18/21 93.4 kg  11/11/21 94.8 kg     Unresulted Labs (From admission, onward)     Start     Ordered   01/03/22 0500  T4, free  Tomorrow morning,   R        01/02/22 0818   01/02/22 0807  Hemoglobin A1c  Once,   R        01/02/22 0806          Data Reviewed: I have personally reviewed following labs and imaging studies CBC: Recent Labs  Lab 01/01/22 1355  WBC 4.7  NEUTROABS 3.0  HGB 10.9*  HCT 34.7*  MCV 94.0  PLT 416   Basic Metabolic Panel: Recent Labs  Lab 01/01/22 1355  NA 138   K 4.1  CL 103  CO2 27  GLUCOSE 130*  BUN 20  CREATININE 1.70*  CALCIUM 9.1   GFR: Estimated Creatinine Clearance: 38.9 mL/min (A) (by C-G formula based on SCr of 1.7 mg/dL (H)). Liver Function Tests: Recent Labs  Lab 01/01/22 1355  AST 15  ALT 9  ALKPHOS 114  BILITOT 0.1*  PROT 6.9  ALBUMIN 3.7   No results for input(s): LIPASE, AMYLASE in the last 168 hours. No results for input(s): AMMONIA in the last 168 hours. Coagulation Profile: Recent Labs  Lab 01/01/22 1355  INR 1.0   BNP (last 3 results) No results for input(s): PROBNP  in the last 8760 hours. HbA1C: No results for input(s): HGBA1C in the last 72 hours. CBG: Recent Labs  Lab 01/01/22 1929 01/02/22 0052 01/02/22 0402 01/02/22 0848  GLUCAP 80 135* 86 88   Lipid Profile: Recent Labs    01/02/22 0426  CHOL 154  HDL 57  LDLCALC 85  TRIG 61  CHOLHDL 2.7   Thyroid Function Tests: Recent Labs    01/02/22 0426  TSH 10.167*   Sepsis Labs: No results for input(s): PROCALCITON, LATICACIDVEN in the last 168 hours.  No results found for this or any previous visit (from the past 240 hour(s)).  Antimicrobials: Anti-infectives (From admission, onward)    None      Culture/Microbiology    Component Value Date/Time   SDES WOUND 04/29/2015 1109   SDES WOUND 04/29/2015 1109   SPECREQUEST PATIENT ON FOLLOWING ANCEF LEFT THYROID MASS 04/29/2015 1109   SPECREQUEST PATIENT ON FOLLOWING ANCEF LEFT THYROID MASS 04/29/2015 1109   CULT  04/29/2015 1109    NO ANAEROBES ISOLATED Performed at [place]  04/29/2015 1109    NO GROWTH 3 DAYS Performed at [facility] 05/04/2015 FINAL 04/29/2015 1109   REPTSTATUS 05/02/2015 FINAL 04/29/2015 1109    Other culture-see note  Radiology Studies: DG Chest 1 View  Result Date: 01/01/2022 CLINICAL DATA:  Weakness, slurred speech, pre MRI EXAM: CHEST  1 VIEW COMPARISON:  CT chest dated 07/20/2017 FINDINGS: Low lung  volumes with mild bibasilar atelectasis. No pleural effusion or pneumothorax. The heart is normal in size. 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 No radiopaque foreign body is seen. IMPRESSION: Low lung volumes with mild bibasilar atelectasis. Electronically Signed   By: Julian Hy M.D.   On: 01/01/2022 19:15   DG Abd 1 View  Result Date: 01/01/2022 CLINICAL DATA:  Weakness, slurred speech, pre MRI EXAM: ABDOMEN - 1 VIEW COMPARISON:  None Available. FINDINGS: Nonobstructive bowel gas pattern. Mild degenerative changes of the lumbar spine. Penile prosthesis. No radiopaque foreign body is seen. IMPRESSION: Negative. Electronically Signed   By: Julian Hy M.D.   On: 01/01/2022 19:12   CT HEAD WO CONTRAST  Result Date: 01/01/2022 CLINICAL DATA:  Provided history: Slurred speech. Additional history provided: Slurred speech for 5 days. EXAM: CT HEAD WITHOUT CONTRAST TECHNIQUE: Contiguous axial images were obtained from the base of the skull through the vertex without intravenous contrast. RADIATION DOSE REDUCTION: This exam was performed according to the departmental dose-optimization program which includes automated exposure control, adjustment of the mA and/or kV according to patient size and/or use of iterative reconstruction technique. COMPARISON:  Brain MRI 10/20/2018. Head CT 10/20/2014. FINDINGS: Brain: Mild-to-moderate generalized cerebral atrophy. Known chronic small-vessel infarcts within the bilateral cerebral hemispheric white matter and right thalamus. Background patchy and ill-defined hypoattenuation within the cerebral white matter, nonspecific but compatible with moderate to advanced chronic small vessel ischemic disease. Focus of Wallerian degeneration within the bilateral cerebral white matter and callosal body. There is no acute intracranial hemorrhage. No demarcated cortical infarct. No extra-axial fluid collection. No evidence of an intracranial  mass. No midline shift. Vascular: No hyperdense vessel. Atherosclerotic calcifications. Skull: No fracture or aggressive osseous lesion. Sinuses/Orbits: No mass or acute finding within the imaged orbits. Minimal mucosal thickening within the bilateral frontal, ethmoid and maxillary sinuses. IMPRESSION: 1. No evidence of acute intracranial abnormality. 2. Known chronic small-vessel infarcts within the bilateral cerebral hemispheric white matter and right thalamus. 3.  Background moderate to advanced cerebral white matter chronic small vessel ischemic disease. 4. Mild-to-moderate generalized cerebral atrophy. Electronically Signed   By: Kellie Simmering D.O.   On: 01/01/2022 14:49   MR ANGIO HEAD WO CONTRAST  Result Date: 01/02/2022 CLINICAL DATA:  Stroke workup EXAM: MRA HEAD WITHOUT CONTRAST TECHNIQUE: Angiographic images of the Circle of Willis were acquired using MRA technique without intravenous contrast. COMPARISON:  10/20/2014 FINDINGS: Anterior circulation: Tortuous ICA below the skull base. Azygous A2 segment. No branch occlusion, beading, or flow limiting stenosis. Negative for aneurysm. Posterior circulation: Mild dominance of the left vertebral artery. The vertebral and basilar arteries are smoothly contoured and diffusely patent. Advanced right P3 segment stenosis. Negative for aneurysm or beading. Anatomic variants: As above IMPRESSION: 1. No emergent finding. 2. 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 Electronically Signed   By: Jorje Guild M.D.   On: 01/02/2022 07:31   MR ANGIO NECK W WO CONTRAST  Result Date: 01/02/2022 CLINICAL DATA:  Stroke, determine embolic source EXAM: MRA NECK WITHOUT AND WITH CONTRAST TECHNIQUE: Multiplanar and multiecho pulse sequences of the neck were obtained without and with intravenous contrast. Angiographic images of the neck were obtained using MRA technique without and with intravenous contrast. CONTRAST:  51m GADAVIST GADOBUTROL 1  MMOL/ML IV SOLN COMPARISON:  None Available. FINDINGS: Antegrade flow in the carotid and vertebral arteries where covered on time-of-flight imaging. No gross abnormality on neck mask. Unremarkable covered portions of the aortic arch. No proximal subclavian stenosis. The vertebral arteries are somewhat tortuous but smoothly contoured and diffusely patent. Bilateral ICA tortuosity below the skull base. No carotid circulation stenosis or ulceration. IMPRESSION: Unremarkable for age.  No stenosis or visible embolic source. Electronically Signed   By: JJorje GuildM.D.   On: 01/02/2022 07:33   MR BRAIN WO CONTRAST  Result Date: 01/01/2022 CLINICAL DATA:  Slurred speech EXAM: MRI HEAD WITHOUT CONTRAST TECHNIQUE: Multiplanar, multiecho pulse sequences of the brain and surrounding structures were obtained without intravenous contrast. COMPARISON:  10/20/2018 MRI head, correlation is also made with CT head 01/01/2022 FINDINGS: Evaluation is somewhat limited by motion artifact. Brain: Small areas of of restricted diffusion with ADC correlates in the subcortical right medial frontal gyrus (series 5, image 96), left body of the corpus callosum (series 5, images 79-80), and subcortical left inferior frontal gyrus (series 5, image 78), with punctate foci in the left basal ganglia (series 5, image 70) and left cerebral peduncle/left thalamus (series 5, image 69). The left inferior frontal gyrus restricted diffusion is less intense than in the corpus callosum or right frontal gyrus, likely less acute. No acute hemorrhage, mass, mass effect, or midline shift. No hydrocephalus or extra-axial collection. 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 Lacunar infarcts in the right cerebellum, right pons, right anterior internal capsule, left posterior caudate/internal capsule, and bilateral corona radiata. No  evidence of significant remote cortical infarct. Confluent T2 hyperintense signal in the periventricular white matter and pons, likely the sequela of severe chronic small vessel ischemic disease. Vascular: Normal arterial flow voids. Skull and upper cervical spine: Normal marrow signal. Sinuses/Orbits: No acute finding. Status post bilateral lens replacements. Other: The mastoids are well aerated. IMPRESSION: Acute and/or subacute infarcts in the right medial frontal gyrus, left inferior frontal gyrus, left body of the corpus callosum, left basal ganglia, and left cerebral peduncle/thalamus. These results were called by telephone at the  time of interpretation on 01/01/2022 at 9:49 pm to provider Carmin Muskrat , who verbally acknowledged these results. Electronically Signed   By: Merilyn Baba M.D.   On: 01/01/2022 21:49     LOS: 1 day   Antonieta Pert, MD [facility]  01/02/2022, 9:04 AM

## 2022-01-02 NOTE — Telephone Encounter (Signed)
Forms were faxed to number provided on sticky note [phone].

## 2022-01-03 ENCOUNTER — Other Ambulatory Visit: Payer: Self-pay | Admitting: Physician Assistant

## 2022-01-03 DIAGNOSIS — I639 Cerebral infarction, unspecified: Secondary | ICD-10-CM

## 2022-01-03 DIAGNOSIS — E785 Hyperlipidemia, unspecified: Secondary | ICD-10-CM | POA: Diagnosis not present

## 2022-01-03 DIAGNOSIS — N1832 Chronic kidney disease, stage 3b: Secondary | ICD-10-CM | POA: Diagnosis not present

## 2022-01-03 DIAGNOSIS — I10 Essential (primary) hypertension: Secondary | ICD-10-CM | POA: Diagnosis not present

## 2022-01-03 DIAGNOSIS — E039 Hypothyroidism, unspecified: Secondary | ICD-10-CM

## 2022-01-03 LAB — T4, FREE: Free T4: 0.99 ng/dL (ref 0.61–1.12)

## 2022-01-03 LAB — BASIC METABOLIC PANEL
Anion gap: 9 (ref 5–15)
BUN: 17 mg/dL (ref 8–23)
CO2: 25 mmol/L (ref 22–32)
Calcium: 9.2 mg/dL (ref 8.9–10.3)
Chloride: 99 mmol/L (ref 98–111)
Creatinine, Ser: 1.42 mg/dL — ABNORMAL HIGH (ref 0.61–1.24)
GFR, Estimated: 49 mL/min — ABNORMAL LOW (ref >60)
Glucose, Bld: 96 mg/dL (ref 70–99)
Potassium: 3.9 mmol/L (ref 3.5–5.1)
Sodium: 133 mmol/L — ABNORMAL LOW (ref 135–145)

## 2022-01-03 LAB — GLUCOSE, CAPILLARY
Glucose-Capillary: 93 mg/dL (ref 70–99)
Glucose-Capillary: 110 mg/dL — ABNORMAL HIGH (ref 70–99)
Glucose-Capillary: 183 mg/dL — ABNORMAL HIGH (ref 70–99)

## 2022-01-03 LAB — MAGNESIUM: Magnesium: 1.8 mg/dL (ref 1.7–2.4)

## 2022-01-03 MED ORDER — ATORVASTATIN CALCIUM 80 MG PO TABS: 80.0000 mg | ORAL_TABLET | Freq: Every day | ORAL | 2 refills | Status: DC

## 2022-01-03 MED ORDER — DONEPEZIL HCL 5 MG PO TABS: 5.0000 mg | ORAL_TABLET | Freq: Every day | ORAL | 0 refills | Status: DC

## 2022-01-03 MED ORDER — ASPIRIN 81 MG PO CHEW: 81.0000 mg | CHEWABLE_TABLET | Freq: Every day | ORAL | 2 refills | Status: DC

## 2022-01-03 MED ORDER — CLOPIDOGREL BISULFATE 75 MG PO TABS: 75.0000 mg | ORAL_TABLET | Freq: Every day | ORAL | 0 refills | Status: DC

## 2022-01-03 NOTE — Progress Notes (Signed)
Patient has been having frequent pauses, missed beats and PACs. He's asymptomatic on re-assessment. Notified Dr. Hal Hope and received an order to do EKG.

## 2022-01-03 NOTE — Discharge Summary (Signed)
Physician Discharge Summary   Patient: Scott Vasquez MRN: [id number] DOB: [birth year]  Admit date:     01/01/2022  Discharge date: 01/03/22  Discharge Physician: Patrecia Pour   PCP: [name], NP   Recommendations at discharge:  Follow up with neurology for post-stroke hospitalization follow up.  Follow up with cardiology who will arrange cardiac monitoring after discharge (discharging over [place] Day weekend).  Monitor ECG, heart rate. Decreasing aricept to 44m due to bradycardia and heart block at discharge.   Follow up with PCP for diabetes management.  Recheck TSH, free T4 at follow up. TSH elevated, but free T4 normal. ?if incomplete adherence to synthroid.  Discharge Diagnoses: Principal Problem:   Stroke ([facility] Active Problems:   Hyperlipidemia   Iron deficiency anemia   Essential hypertension   Stroke with cerebral ischemia (HCC)   Uncontrolled type 2 diabetes mellitus with hyperglycemia (HCC)   Moderate dementia with behavioral disturbance (HCC)   CKD (chronic kidney disease), stage III ([facility]   Hypothyroidism  Hospital Course: Scott Vasquez is a 81y.o. male with a history of CVA in 2001, HTN, HLD, T2DM, stage IIIb CKD, dementia who presented to the ED with difficulty speaking for several days. Initial CT head showed no acute finding. Subsequent MRI of the brain showed acute and/or subacute infarcts in the right medial frontal gyrus, left inferior frontal gyrus, left body of the corpus callosum, left basal ganglia and left cerebral peduncle/thalamus. Neurology was consulted and stroke work up was pursued for bilateral multifocal strokes.   He will be discharged home per PT/OT recommendations with further details as outlined below.   Assessment and Plan: Multifocal subcortical strokes, small vessel disease versus cardioembolic or secondary to Aortic arch atherosclerosis: MRA reveals right P3 stenosis, bilateral ICA tortuosity. No cardioembolic source on TTE. - Neurology  consulted > starting DAPT x3 weeks, then aspirin monotherapy. - Cardiology consulted to arrange outpatient 30-day cardiac monitor for cryptogenic stroke with bicerebral embolic infarcts.  - Needs tighter glycemic control. See discussion below.  - Continue atorvastatin, augment with LDL >70.   Uncontrolled T2DM with hyperglycemia: HbA1c is above goal at 9.3% though this is down from prior and PCP notes indicate that his blood sugars are better when he takes medications. Given his cognitive impairment and normal fasting CBG, will not initiate additional treatment but needs PCP follow up. Historical records indicate he has been prescribed 70/30 insulin in the past.   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  - Only provocative agent is aricept, so will decrease this dose.  - Concern for subtherapeutic synthroid dosing with elevated TSH, though free T4 is normal (?taking synthroid inconsistently at home) - Will have cardiac monitoring as above anyway.  Hyperlipidemia: Augment statin.   Iron deficiency anemia: Stable.   Essential hypertension: BPs elevated w/permissive HTN. Will restart home meds at discharge.   Moderate dementia with behavioral disturbance:  - Decrease aricept due to bradycardia/heart block.  - Continue namenda.    CKD stage IIIb: Stable.   Hypothyroidism:  - Needs recheck TFTs - Continue synthroid 566m for now, though may need dose increase if TSH remains elevated. Looking at historical data, TSH has been both normal (2018 through 2020) and elevated (2021 - 2023) on the same dose of synthroid. Free T4 has historically been wnl.  Consultants: Neurology Procedures performed: None  Disposition: Home Diet recommendation:  Cardiac and Carb modified diet DISCHARGE MEDICATION: Allergies as  of 01/03/2022       Reactions   Actos [pioglitazone] Other (See Comments)   Diarrhea    Metformin And Related  Other (See Comments)   Diarrhea         Medication List     TAKE these medications    Accu-Chek Aviva Plus test strip Generic drug: glucose blood TEST BLOOD SUGAR TWICE DAILY   Accu-Chek Aviva Plus w/Device Kit USE AS DIRECTED   aspirin 81 MG chewable tablet Chew 1 tablet (81 mg total) by mouth daily. Start taking on: Jan 04, 2022   atorvastatin 80 MG tablet Commonly known as: LIPITOR Take 1 tablet (80 mg total) by mouth daily. What changed:  medication strength how much to take   citalopram 20 MG tablet Commonly known as: CeleXA Take 1 tablet (20 mg total) by mouth daily.   clopidogrel 75 MG tablet Commonly known as: PLAVIX Take 1 tablet (75 mg total) by mouth daily.   cyanocobalamin 1000 MCG tablet Take 1,000 mcg by mouth daily.   donepezil 5 MG tablet Commonly known as: Aricept Take 1 tablet (5 mg total) by mouth at bedtime. What changed:  medication strength how much to take   Gemtesa 75 MG Tabs Generic drug: Vibegron Take 75 mg by mouth daily.   glipiZIDE 10 MG 24 hr tablet Commonly known as: GLUCOTROL XL Take 1 tablet (10 mg total) by mouth daily with breakfast.   levocetirizine 5 MG tablet Commonly known as: XYZAL TAKE ONE TABLET BY MOUTH EVERY EVENING.   levothyroxine 50 MCG tablet Commonly known as: SYNTHROID Take 1 tablet (50 mcg total) by mouth daily. What changed: when to take this   lisinopril 40 MG tablet Commonly known as: ZESTRIL Take 1 tablet (40 mg total) by mouth daily.   memantine 10 MG tablet Commonly known as: NAMENDA 1 TAB TWICE A DAY What changed:  how much to take how to take this when to take this additional instructions   oxybutynin 10 MG 24 hr tablet Commonly known as: Ditropan XL Take 1 tablet (10 mg total) by mouth at bedtime.   pioglitazone 30 MG tablet Commonly known as: ACTOS Take 1 tablet (30 mg total) by mouth daily.   Trulicity 3 TT/0.1XB Sopn Generic drug: Dulaglutide Inject 3 mg as directed  once a week. What changed: when to take this        Follow-up Information     [name], NP. Schedule an appointment as soon as possible for a visit in 1 week(s).   Specialty: Family Medicine Contact information: [address] [place] [address] [phone]         Garvin Fila, MD Follow up in 2 month(s).   Specialties: Neurology, Radiology Why: stroke hospitalization follow up Contact information: [place] [id number] [place], [facility] Follow up.   Specialty: Home Health Services Why: for home health services Contact information: [place] [address] [phone]                Discharge Exam: BP (!) 171/87 (BP Location: Left Arm)   Pulse 71   Temp 98.3 F (36.8 C) (Oral)   Resp 18   Ht 5' 10.5" (1.791 m)   Wt 94 kg   SpO2 100%   BMI 29.31 kg/m   Pleasant, alert interactive male in no distress Clear, nonlabored RRR with rate in 70's during exam  and on telemetry review.  No focal deficits, stuttering and slowed speech pattern noted. , follows commands.   Condition at discharge: stable  The results of significant diagnostics from this hospitalization (including imaging, microbiology, ancillary and laboratory) are listed below for reference.   Imaging Studies: DG Chest 1 View  Result Date: 01/01/2022 CLINICAL DATA:  Weakness, slurred speech, pre MRI EXAM: CHEST  1 VIEW COMPARISON:  CT chest dated 07/20/2017 FINDINGS: Low lung volumes with mild bibasilar atelectasis. No pleural effusion or pneumothorax. The heart is normal in size. 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 No radiopaque foreign body is seen. IMPRESSION: Low lung volumes with mild bibasilar atelectasis. Electronically Signed   By: Julian Hy M.D.   On: 01/01/2022 19:15   DG Abd 1 View  Result Date: 01/01/2022 CLINICAL DATA:  Weakness, slurred  speech, pre MRI EXAM: ABDOMEN - 1 VIEW COMPARISON:  None Available. FINDINGS: Nonobstructive bowel gas pattern. Mild degenerative changes of the lumbar spine. Penile prosthesis. No radiopaque foreign body is seen. IMPRESSION: Negative. Electronically Signed   By: Julian Hy M.D.   On: 01/01/2022 19:12   CT HEAD WO CONTRAST  Result Date: 01/01/2022 CLINICAL DATA:  Provided history: Slurred speech. Additional history provided: Slurred speech for 5 days. EXAM: CT HEAD WITHOUT CONTRAST TECHNIQUE: Contiguous axial images were obtained from the base of the skull through the vertex without intravenous contrast. RADIATION DOSE REDUCTION: This exam was performed according to the departmental dose-optimization program which includes automated exposure control, adjustment of the mA and/or kV according to patient size and/or use of iterative reconstruction technique. COMPARISON:  Brain MRI 10/20/2018. Head CT 10/20/2014. FINDINGS: Brain: Mild-to-moderate generalized cerebral atrophy. Known chronic small-vessel infarcts within the bilateral cerebral hemispheric white matter and right thalamus. Background patchy and ill-defined hypoattenuation within the cerebral white matter, nonspecific but compatible with moderate to advanced chronic small vessel ischemic disease. Focus of Wallerian degeneration within the bilateral cerebral white matter and callosal body. There is no acute intracranial hemorrhage. No demarcated cortical infarct. No extra-axial fluid collection. No evidence of an intracranial mass. No midline shift. Vascular: No hyperdense vessel. Atherosclerotic calcifications. Skull: No fracture or aggressive osseous lesion. Sinuses/Orbits: No mass or acute finding within the imaged orbits. Minimal mucosal thickening within the bilateral frontal, ethmoid and maxillary sinuses. IMPRESSION: 1. No evidence of acute intracranial abnormality. 2. Known chronic small-vessel infarcts within the bilateral cerebral  hemispheric white matter and right thalamus. 3. Background moderate to advanced cerebral white matter chronic small vessel ischemic disease. 4. Mild-to-moderate generalized cerebral atrophy. Electronically Signed   By: Kellie Simmering D.O.   On: 01/01/2022 14:49   MR ANGIO HEAD WO CONTRAST  Result Date: 01/02/2022 CLINICAL DATA:  Stroke workup EXAM: MRA HEAD WITHOUT CONTRAST TECHNIQUE: Angiographic images of the Circle of Willis were acquired using MRA technique without intravenous contrast. COMPARISON:  10/20/2014 FINDINGS: Anterior circulation: Tortuous ICA below the skull base. Azygous A2 segment. No branch occlusion, beading, or flow limiting stenosis. Negative for aneurysm. Posterior circulation: Mild dominance of the left vertebral artery. The vertebral and basilar arteries are smoothly contoured and diffusely patent. Advanced right P3 segment stenosis. Negative for aneurysm or beading. Anatomic variants: As above IMPRESSION: 1. No emergent finding. 2. 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 Electronically Signed   By: Jorje Guild M.D.   On: 01/02/2022 07:31   MR ANGIO NECK W WO CONTRAST  Result Date: 01/02/2022 CLINICAL DATA:  Stroke, determine embolic source  EXAM: MRA NECK WITHOUT AND WITH CONTRAST TECHNIQUE: Multiplanar and multiecho pulse sequences of the neck were obtained without and with intravenous contrast. Angiographic images of the neck were obtained using MRA technique without and with intravenous contrast. CONTRAST:  33m GADAVIST GADOBUTROL 1 MMOL/ML IV SOLN COMPARISON:  None Available. FINDINGS: Antegrade flow in the carotid and vertebral arteries where covered on time-of-flight imaging. No gross abnormality on neck mask. Unremarkable covered portions of the aortic arch. No proximal subclavian stenosis. The vertebral arteries are somewhat tortuous but smoothly contoured and diffusely patent. Bilateral ICA tortuosity below the skull base. No carotid circulation  stenosis or ulceration. IMPRESSION: Unremarkable for age.  No stenosis or visible embolic source. Electronically Signed   By: JJorje GuildM.D.   On: 01/02/2022 07:33   MR BRAIN WO CONTRAST  Result Date: 01/01/2022 CLINICAL DATA:  Slurred speech EXAM: MRI HEAD WITHOUT CONTRAST TECHNIQUE: Multiplanar, multiecho pulse sequences of the brain and surrounding structures were obtained without intravenous contrast. COMPARISON:  10/20/2018 MRI head, correlation is also made with CT head 01/01/2022 FINDINGS: Evaluation is somewhat limited by motion artifact. Brain: Small areas of of restricted diffusion with ADC correlates in the subcortical right medial frontal gyrus (series 5, image 96), left body of the corpus callosum (series 5, images 79-80), and subcortical left inferior frontal gyrus (series 5, image 78), with punctate foci in the left basal ganglia (series 5, image 70) and left cerebral peduncle/left thalamus (series 5, image 69). The left inferior frontal gyrus restricted diffusion is less intense than in the corpus callosum or right frontal gyrus, likely less acute. No acute hemorrhage, mass, mass effect, or midline shift. No hydrocephalus or extra-axial collection. 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 Lacunar infarcts in the right cerebellum, right pons, right anterior internal capsule, left posterior caudate/internal capsule, and bilateral corona radiata. No evidence of significant remote cortical infarct. Confluent T2 hyperintense signal in the periventricular white matter and pons, likely the sequela of severe chronic small vessel ischemic disease. Vascular: Normal arterial flow voids. Skull and upper cervical spine: Normal marrow signal. Sinuses/Orbits: No acute finding. Status post bilateral lens replacements. Other: The mastoids are well aerated. IMPRESSION: Acute and/or  subacute infarcts in the right medial frontal gyrus, left inferior frontal gyrus, left body of the corpus callosum, left basal ganglia, and left cerebral peduncle/thalamus. These results were called by telephone at the time of interpretation on 01/01/2022 at 9:49 pm to provider RCarmin Muskrat, who verbally acknowledged these results. Electronically Signed   By: AMerilyn BabaM.D.   On: 01/01/2022 21:49   ECHOCARDIOGRAM COMPLETE  Result Date: 01/02/2022    ECHOCARDIOGRAM REPORT   Patient Name:   Scott Vasquez Date of Exam: 01/02/2022 Medical Rec #:  [id number]   Height:       70.5 in Accession #:    [id number]  Weight:       207.2 lb Date of Birth:  109-23-1942   BSA:          2.130 m Patient Age:    89years     BP:           146/81 mmHg Patient Gender: M            HR:           68 bpm. Exam Location:  Inpatient Procedure: 2D Echo Indications:    Stroke  History:  Patient has prior history of Echocardiogram examinations, most                 recent 09/03/2017. Risk Factors:Hypertension and Diabetes.  Sonographer:    Jefferey Pica Referring Phys: [place]  1. Left ventricular ejection fraction, by estimation, is 60 to 65%. The left ventricle has normal function. The left ventricle has no regional wall motion abnormalities. Left ventricular diastolic parameters are indeterminate.  2. Right ventricular systolic function is normal. The right ventricular size is normal. There is normal pulmonary artery systolic pressure. The estimated right ventricular systolic pressure is 91.7 mmHg.  3. The mitral valve is degenerative. Trivial mitral valve regurgitation. No evidence of mitral stenosis. The mean mitral valve gradient is 2.1 mmHg with average heart rate of 70 bpm. Moderate mitral annular calcification.  4. The aortic valve is abnormal. There is moderate calcification of the aortic valve. Aortic valve regurgitation is mild. Aortic valve sclerosis/calcification is present, without  any evidence of aortic stenosis.  5. The inferior vena cava is normal in size with greater than 50% respiratory variability, suggesting right atrial pressure of 3 mmHg. FINDINGS  Left Ventricle: Left ventricular ejection fraction, by estimation, is 60 to 65%. The left ventricle has normal function. The left ventricle has no regional wall motion abnormalities. The left ventricular internal cavity size was normal in size. There is  no left ventricular hypertrophy. Left ventricular diastolic function could not be evaluated due to mitral annular calcification (moderate or greater). Left ventricular diastolic parameters are indeterminate. Right Ventricle: The right ventricular size is normal. No increase in right ventricular wall thickness. Right ventricular systolic function is normal. There is normal pulmonary artery systolic pressure. The tricuspid regurgitant velocity is 2.63 m/s, and  with an assumed right atrial pressure of 5 mmHg, the estimated right ventricular systolic pressure is 91.5 mmHg. Left Atrium: Left atrial size was normal in size. Right Atrium: Right atrial size was normal in size. Pericardium: There is no evidence of pericardial effusion. Mitral Valve: The mitral valve is degenerative in appearance. Moderate mitral annular calcification. Trivial mitral valve regurgitation. No evidence of mitral valve stenosis. The mean mitral valve gradient is 2.1 mmHg with average heart rate of 70 bpm. Tricuspid Valve: The tricuspid valve is normal in structure. Tricuspid valve regurgitation is mild . No evidence of tricuspid stenosis. Aortic Valve: The aortic valve is abnormal. There is moderate calcification of the aortic valve. Aortic valve regurgitation is mild. Aortic regurgitation PHT measures 789 msec. Aortic valve sclerosis/calcification is present, without any evidence of aortic stenosis. Aortic valve mean gradient measures 6.3 mmHg. Aortic valve peak gradient measures 12.2 mmHg. Aortic valve area, by VTI  measures 2.32 cm. Pulmonic Valve: The pulmonic valve was normal in structure. Pulmonic valve regurgitation is trivial. No evidence of pulmonic stenosis. Aorta: Ascending aorta measurement upper limit of normal. The aortic root is normal in size and structure. Venous: The inferior vena cava is normal in size with greater than 50% respiratory variability, suggesting right atrial pressure of 3 mmHg. IAS/Shunts: No atrial level shunt detected by color flow Doppler.  LEFT VENTRICLE PLAX 2D LVIDd:         3.65 cm   Diastology LVIDs:         2.30 cm   LV e' medial:    4.65 cm/s LV PW:         1.40 cm   LV E/e' medial:  18.9 LV IVS:        1.35  cm   LV e' lateral:   6.08 cm/s LVOT diam:     2.00 cm   LV E/e' lateral: 14.5 LV SV:         85 LV SV Index:   40 LVOT Area:     3.14 cm  RIGHT VENTRICLE         IVC TAPSE (M-mode): 2.4 cm  IVC diam: 1.50 cm LEFT ATRIUM             Index        RIGHT ATRIUM           Index LA diam:        4.25 cm 2.00 cm/m   RA Area:     15.90 cm LA Vol (A2C):   78.7 ml 36.94 ml/m  RA Volume:   45.70 ml  21.45 ml/m LA Vol (A4C):   58.2 ml 27.32 ml/m LA Biplane Vol: 68.1 ml 31.97 ml/m  AORTIC VALVE                     PULMONIC VALVE AV Area (Vmax):    2.20 cm      PV Vmax:       1.01 m/s AV Area (Vmean):   2.16 cm      PV Peak grad:  4.1 mmHg AV Area (VTI):     2.32 cm AV Vmax:           174.42 cm/s AV Vmean:          117.521 cm/s AV VTI:            0.364 m AV Peak Grad:      12.2 mmHg AV Mean Grad:      6.3 mmHg LVOT Vmax:         122.00 cm/s LVOT Vmean:        80.700 cm/s LVOT VTI:          0.269 m LVOT/AV VTI ratio: 0.74 AI PHT:            789 msec  AORTA Ao Root diam: 3.70 cm Ao Asc diam:  3.80 cm MITRAL VALVE                TRICUSPID VALVE MV Area (PHT): 3.05 cm     TR Peak grad:   27.7 mmHg MV Mean grad:  2.1 mmHg     TR Vmax:        263.00 cm/s MV Decel Time: 249 msec MV E velocity: 87.90 cm/s   SHUNTS MV A velocity: 102.00 cm/s  Systemic VTI:  0.27 m MV E/A ratio:  0.86          Systemic Diam: 2.00 cm Cherlynn Kaiser MD Electronically signed by Cherlynn Kaiser MD Signature Date/Time: 01/02/2022/3:31:18 PM    Final     Microbiology: Results for orders placed or performed in visit on 07/24/16  Urine culture     Status: None   Collection Time: 07/24/16  5:04 PM   Specimen: Urine  Result Value Ref Range Status   Organism ID, Bacteria NO GROWTH  Final    Labs: CBC: Recent Labs  Lab 01/01/22 1355  WBC 4.7  NEUTROABS 3.0  HGB 10.9*  HCT 34.7*  MCV 94.0  PLT 182   Basic Metabolic Panel: Recent Labs  Lab 01/01/22 1355 01/03/22 0359  NA 138 133*  K 4.1 3.9  CL 103 99  CO2 27 25  GLUCOSE 130* 96  BUN 20 17  CREATININE 1.70* 1.42*  CALCIUM 9.1 9.2  MG  --  1.8   Liver Function Tests: Recent Labs  Lab 01/01/22 1355  AST 15  ALT 9  ALKPHOS 114  BILITOT 0.1*  PROT 6.9  ALBUMIN 3.7   CBG: Recent Labs  Lab 01/02/22 1948 01/02/22 2316 01/03/22 0333 01/03/22 0936 01/03/22 1243  GLUCAP 192* 122* 93 110* 183*    Discharge time spent: greater than 30 minutes.  Signed: Patrecia Pour, MD [facility] 01/03/2022

## 2022-01-03 NOTE — Progress Notes (Signed)
c 

## 2022-01-03 NOTE — Plan of Care (Signed)
  Problem: Coping: Goal: Will identify appropriate support needs Outcome: Progressing   Problem: Nutrition: Goal: Risk of aspiration will decrease Outcome: Progressing   Problem: Ischemic Stroke/TIA Tissue Perfusion: Goal: Complications of ischemic stroke/TIA will be minimized Outcome: Progressing

## 2022-01-03 NOTE — Progress Notes (Signed)
Physical Therapy Treatment Patient Details Name: Scott Vasquez MRN: [id number] DOB: 1941/01/28 Today's Date: 01/03/2022   History of Present Illness 81 y.o. male admitted 5/25 with slurred speech and multifocal infarcts. MRI - Acute and/or subacute infarcts in the right medial frontal gyrus,  left inferior frontal gyrus, left body of the corpus callosum, left  basal ganglia, and left cerebral peduncle/thalamus. 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  He had a bout of urinary incontinence in standing.  He required increased standing trials at sink to perform pericare and change gown after urinary incontinence.  Will plan for outpatient PT at d/c for high level balance training.     Recommendations for follow up therapy are one component of a multi-disciplinary discharge planning process, led by the attending physician.  Recommendations may be updated based on patient status, additional functional criteria and insurance authorization.  Follow Up Recommendations  Outpatient PT     Assistance Recommended at Discharge Intermittent Supervision/Assistance  Patient can return home with the following Assist for transportation;Direct supervision/assist for medications management;Direct supervision/assist for financial management   Equipment Recommendations  None recommended by PT    Recommendations for Other Services       Precautions / Restrictions Precautions Precautions: Fall Restrictions Weight Bearing Restrictions: No     Mobility  Bed Mobility Overal bed mobility: Needs Assistance Bed Mobility: Sit to Supine       Sit to supine: Modified independent (Device/Increase time)   General bed mobility comments: increased time and effort.    Transfers Overall transfer level: Needs assistance Equipment used: None Transfers: Sit to/from Stand Sit to  Stand: Min guard           General transfer comment: Min Gaurd A for safety    Ambulation/Gait Ambulation/Gait assistance: Supervision Gait Distance (Feet): 500 Feet Assistive device: None Gait Pattern/deviations: Step-through pattern, Decreased stride length       General Gait Details: Cues for scap retraction and forward gaze.  Pt with slow guarded gt and slow turns.   Stairs Stairs: Yes Stairs assistance: Modified independent (Device/Increase time) Stair Management: Two rails, Alternating pattern, Forwards Number of Stairs: 6 General stair comments: pt performed 6 stairs safely with bil rails   Wheelchair Mobility    Modified Rankin (Stroke Patients Only)       Balance Overall balance assessment: Mild deficits observed, not formally tested Sitting-balance support: Feet supported, No upper extremity supported Sitting balance-Leahy Scale: Good     Standing balance support: No upper extremity supported, During functional activity Standing balance-Leahy Scale: Good                              Cognition Arousal/Alertness: Awake/alert Behavior During Therapy: WFL for tasks assessed/performed                                   General Comments: Pt able to locate room this session without difficulty but he was slow to ensure he was at the correct room.  He is slow to process and follow commands.        Exercises      General Comments        Pertinent Vitals/Pain Pain Assessment Pain Assessment: No/denies pain Faces Pain  Scale: No hurt Pain Intervention(s): Monitored during session, Repositioned    Home Living                          Prior Function            PT Goals (current goals can now be found in the care plan section) Acute Rehab PT Goals Patient Stated Goal: return to yard work Potential to Achieve Goals: Fair Progress towards PT goals: Progressing toward goals    Frequency    Min  3X/week      PT Plan Current plan remains appropriate    Co-evaluation              AM-PAC PT "6 Clicks" Mobility   Outcome Measure  Help needed turning from your back to your side while in a flat bed without using bedrails?: None Help needed moving from lying on your back to sitting on the side of a flat bed without using bedrails?: None Help needed moving to and from a bed to a chair (including a wheelchair)?: None Help needed standing up from a chair using your arms (e.g., wheelchair or bedside chair)?: A Little Help needed to walk in hospital room?: A Little Help needed climbing 3-5 steps with a railing? : None 6 Click Score: 22    End of Session Equipment Utilized During Treatment: Gait belt Activity Tolerance: Patient tolerated treatment well Patient left: in chair;with call bell/phone within reach;with chair alarm set Nurse Communication: Mobility status PT Visit Diagnosis: Other abnormalities of gait and mobility (R26.89)     Time: 8099-8338 PT Time Calculation (min) (ACUTE ONLY): 28 min  Charges:  $Gait Training: 8-22 mins $Therapeutic Activity: 8-22 mins                     Erasmo Leventhal , PTA Acute Rehabilitation Services Pager [phone] Office [phone],    Cristela Blue 01/03/2022, 2:08 PM

## 2022-01-03 NOTE — TOC Transition Note (Addendum)
Transition of Care [facility]) - CM/SW Discharge Note   Patient Details  Name: Scott Vasquez MRN: [id number] Date of Birth: 01/15/1941  [facility] of Care [facility]) CM/SW Contact:  Carles Collet, RN Phone Number: 01/03/2022, 2:19 PM   Clinical Narrative:   Spoke with patient at bedside, he deferred to wife.  Discussed HH recommendation with wife, no preference for provider. Referral accepted by [facility] for PT and SLP, requested MD to write orders.  No DME needs, anticipated DC later today  [employer] unable to cover, [facility] Lemmings accepted    Final next level of care: [place] Barriers to Discharge: No Barriers Identified   Patient Goals and CMS Choice Patient states their goals for this hospitalization and ongoing recovery are:: to go hpme CMS Medicare.gov Compare Post Acute Care list provided to:: Other (Comment Required) Choice offered to / list presented to : Spouse  Discharge Placement                       Discharge Plan and Services                          HH Arranged: PT, Speech Therapy HH Agency: [facility] Date [place]: 01/03/22 Time [place]: 1419 Representative spoke with at [place]: Danise Mina L  Social Determinants of Health (SDOH) Interventions     Readmission Risk Interventions     View : No data to display.

## 2022-01-03 NOTE — Progress Notes (Signed)
Occupational Therapy Treatment Patient Details Name: Scott Vasquez MRN: [id number] DOB: 06-08-1941 Today's Date: 01/03/2022   History of present illness 81 y.o. male admitted 5/25 with slurred speech and multifocal infarcts. MRI - Acute and/or subacute infarcts in the right medial frontal gyrus,  left inferior frontal gyrus, left body of the corpus callosum, left  basal ganglia, and left cerebral peduncle/thalamus. PMhx:CVA in 2001 with Left hand numbness, HTN, HLD, DM2, vascular Dementia, CKD stage 3b, anemia, hypothyroidism   OT comments  Pt making good progress with OT goals. This session he worked on ADL's in the room, requiring up to min guard for safety. At times pt having difficulty sequencing and following commands without increased cuing. OT will continue to follow acutely.    Recommendations for follow up therapy are one component of a multi-disciplinary discharge planning process, led by the attending physician.  Recommendations may be updated based on patient status, additional functional criteria and insurance authorization.    Follow Up Recommendations  No OT follow up    Assistance Recommended at Discharge Frequent or constant Supervision/Assistance  Patient can return home with the following  A little help with bathing/dressing/bathroom   Equipment Recommendations  None recommended by OT    Recommendations for Other Services      Precautions / Restrictions Precautions Precautions: Fall Restrictions Weight Bearing Restrictions: No       Mobility Bed Mobility               General bed mobility comments: Up in recliner on entry    Transfers Overall transfer level: Needs assistance Equipment used: None Transfers: Sit to/from Stand Sit to Stand: Min guard           General transfer comment: Min Gaurd A for safety     Balance Overall balance assessment: Mild deficits observed, not formally tested Sitting-balance support: Feet supported, No upper  extremity supported Sitting balance-Leahy Scale: Good     Standing balance support: No upper extremity supported, During functional activity Standing balance-Leahy Scale: Good                             ADL either performed or assessed with clinical judgement   ADL Overall ADL's : Needs assistance/impaired Eating/Feeding: Set up;Sitting Eating/Feeding Details (indicate cue type and reason): was finishing lunch at end of session Grooming: Wash/dry hands;Wash/dry face;Oral care;Minimal assistance;Standing Grooming Details (indicate cue type and reason): completed at sink, cuing for safety                 Toilet Transfer: Min guard;Ambulation Toilet Transfer Details (indicate cue type and reason): able to ambulate household distance and sit<>stand from standardtoilet height Toileting- Clothing Manipulation and Hygiene: Min guard;Sit to/from stand Toileting - Clothing Manipulation Details (indicate cue type and reason): completed from toilet     Functional mobility during ADLs: Min guard General ADL Comments: Pt performed multiple ADL tasks in the room this session, min gaurd for safety and cuing.    Extremity/Trunk Assessment              Vision       Perception     Praxis      Cognition Arousal/Alertness: Awake/alert Behavior During Therapy: WFL for tasks assessed/performed Overall Cognitive Status: History of cognitive impairments - at baseline  General Comments: Pt presents with slow processing and difficulty following commands without cuing        Exercises      Shoulder Instructions       General Comments VSS on RA    Pertinent Vitals/ Pain       Pain Assessment Pain Assessment: No/denies pain  Home Living                                          Prior Functioning/Environment              Frequency  Min 2X/week        Progress Toward Goals  OT  Goals(current goals can now be found in the care plan section)  Progress towards OT goals: Progressing toward goals  Acute Rehab OT Goals Patient Stated Goal: To go home OT Goal Formulation: With patient Time For Goal Achievement: 01/16/22 Potential to Achieve Goals: Good ADL Goals Pt Will Perform Grooming: with modified independence;standing Pt Will Transfer to Toilet: with modified independence;ambulating;regular height toilet Additional ADL Goal #1: Pt will recall and perform 3/3 grooming tasks with Min cues  Plan Discharge plan remains appropriate;Frequency remains appropriate    Co-evaluation                 AM-PAC OT "6 Clicks" Daily Activity     Outcome Measure   Help from another person eating meals?: None Help from another person taking care of personal grooming?: A Little Help from another person toileting, which includes using toliet, bedpan, or urinal?: A Little Help from another person bathing (including washing, rinsing, drying)?: A Little Help from another person to put on and taking off regular upper body clothing?: A Little Help from another person to put on and taking off regular lower body clothing?: A Little 6 Click Score: 19    End of Session Equipment Utilized During Treatment: Gait belt  OT Visit Diagnosis: Unsteadiness on feet (R26.81);Other abnormalities of gait and mobility (R26.89);Muscle weakness (generalized) (M62.81)   Activity Tolerance Patient tolerated treatment well   Patient Left in chair;with call bell/phone within reach;with chair alarm set   Nurse Communication Mobility status        Time: 2633-3545 OT Time Calculation (min): 10 min  Charges: OT General Charges $OT Visit: 1 Visit OT Treatments $Self Care/Home Management : 8-22 mins  Lindell Renfrew H., OTR/L Acute Rehabilitation  Bana Borgmeyer Elane Yolanda Bonine 01/03/2022, 3:51 PM

## 2022-01-06 ENCOUNTER — Other Ambulatory Visit: Payer: Self-pay | Admitting: Physician Assistant

## 2022-01-06 ENCOUNTER — Other Ambulatory Visit: Payer: Self-pay

## 2022-01-06 ENCOUNTER — Telehealth: Payer: Self-pay

## 2022-01-06 ENCOUNTER — Encounter: Payer: Self-pay | Admitting: *Deleted

## 2022-01-06 DIAGNOSIS — I440 Atrioventricular block, first degree: Secondary | ICD-10-CM

## 2022-01-06 DIAGNOSIS — I639 Cerebral infarction, unspecified: Secondary | ICD-10-CM

## 2022-01-06 DIAGNOSIS — I4891 Unspecified atrial fibrillation: Secondary | ICD-10-CM

## 2022-01-06 DIAGNOSIS — R001 Bradycardia, unspecified: Secondary | ICD-10-CM

## 2022-01-06 NOTE — Patient Outreach (Addendum)
Received a red flag Emmi stroke notification for Scott Vasquez . I have assigned Jon Billings, RN to call for follow up and determine if there are any Case Management needs.    [facility], [place], [place] Management [phone]

## 2022-01-06 NOTE — Progress Notes (Signed)
Patient ID: Scott Vasquez, male   DOB: [birth year], 81 y.o.   MRN: [id number] Patient enrolled for [employer] to ship a 30 day cardiac event monitor to his address on file.

## 2022-01-06 NOTE — Telephone Encounter (Addendum)
Transition Care Management Unsuccessful Follow-up Telephone Call  Date of discharge and from where:  [place] 01-03-22 Dx: Stroke   Attempts:  1st Attempt  Reason for unsuccessful TCM follow-up call:  Unable to reach patient- I asked wife to have pt call me back   Transition Care Management Follow-up Telephone Scott Vasquez 01-03-22 Dx: Stroke  Date of discharge and from where:  How have you been since you were released from the hospital? Doing pretty good  Any questions or concerns? No  Items Reviewed: Did the pt receive and understand the discharge instructions provided? Yes  Medications obtained and verified? Yes  Other? No  Any new allergies since your discharge? No  Dietary orders reviewed? Yes Do you have support at home? Yes   Home Care and Equipment/Supplies: Were home health services ordered? Yes PT/OT If so, what is the name of the agency? [facility] Has the agency set up a time to come to the patient's home? yes Were any new equipment or medical supplies ordered?  No What is the name of the medical supply agency? [facility] Were you able to get the supplies/equipment? no Do you have any questions related to the use of the equipment or supplies? No  Functional Questionnaire: (I = Independent and D = Dependent) ADLs: I  Bathing/Dressing- I  Meal Prep- D  Eating- I  Maintaining continence- I  Transferring/Ambulation- I-  Managing Meds- D  Follow up appointments reviewed:  PCP Hospital f/u appt confirmed? Yes  Scheduled to see [name] NP on 01-16-22- @ [facility] f/u appt confirmed? No  Pt is aware of making fu appt with neurology . Are transportation arrangements needed? No  If their condition worsens, is the pt aware to call PCP or go to the Emergency Dept.? Yes Was the patient provided with contact information for the PCP's office or ED? Yes Was to pt encouraged to call back with questions or concerns? Yes

## 2022-01-06 NOTE — Patient Outreach (Addendum)
[facility]) Care Management  01/06/2022  Scott Vasquez March 26, 1941 [id number]   EMMI-Stroke RED ON EMMI ALERT Day # 1 Date: 01/05/22 Red Alert Reason:  Filled new prescriptions? No  Know how/when to take meds? No    Outreach attempt: spoke with spouse Scott Vasquez, who is on the [place].  She states patient is with therapy and took the call.  Discussed reason for call.  She states she answered the questions on yesterday and that patient had no new medications on discharge and no questions about medications and how to take. He states she has called the PCP office and patient has follow up appointment set. She states she also called the neurologist for follow up but did not set up follow up. Advised her to call back and set a follow up appointment for 2 months.  Also discussed [employer] transportation if transportation becomes an issues.  She verbalized understanding and thankful for  information.  No concerns.    Advised that there would be more calls to come and that if any issues come up with automated calls that CM would call again. She verbalized understanding.   Plan: RN CM will close case as patient is patient of [place] practice.    Scott Baseman, RN, MSN [facility] Care Management Care Management Coordinator Direct Line [phone] Toll Free: [phone]  Fax: [phone]

## 2022-01-07 DIAGNOSIS — E1169 Type 2 diabetes mellitus with other specified complication: Secondary | ICD-10-CM | POA: Diagnosis not present

## 2022-01-07 DIAGNOSIS — I10 Essential (primary) hypertension: Secondary | ICD-10-CM

## 2022-01-07 DIAGNOSIS — N3281 Overactive bladder: Secondary | ICD-10-CM

## 2022-01-07 DIAGNOSIS — F32A Depression, unspecified: Secondary | ICD-10-CM | POA: Diagnosis not present

## 2022-01-07 DIAGNOSIS — N401 Enlarged prostate with lower urinary tract symptoms: Secondary | ICD-10-CM | POA: Diagnosis not present

## 2022-01-07 DIAGNOSIS — E039 Hypothyroidism, unspecified: Secondary | ICD-10-CM | POA: Diagnosis not present

## 2022-01-07 DIAGNOSIS — Z794 Long term (current) use of insulin: Secondary | ICD-10-CM | POA: Diagnosis not present

## 2022-01-07 DIAGNOSIS — Z7984 Long term (current) use of oral hypoglycemic drugs: Secondary | ICD-10-CM

## 2022-01-07 DIAGNOSIS — E785 Hyperlipidemia, unspecified: Secondary | ICD-10-CM

## 2022-01-07 DIAGNOSIS — Z87891 Personal history of nicotine dependence: Secondary | ICD-10-CM

## 2022-01-14 ENCOUNTER — Telehealth: Payer: Self-pay | Admitting: Pharmacist

## 2022-01-14 NOTE — Chronic Care Management (AMB) (Signed)
Chronic Care Management Pharmacy Assistant   Name: Scott Vasquez  MRN: [id number] DOB: November 05, 1940  Reason for Encounter: Disease State    Conditions to be addressed/monitored: DMII & BP  Recent office visits:  None  Recent consult visits:  None  Hospital visits:  Medication Reconciliation was completed by comparing discharge summary, patient's EMR and Pharmacy list, and upon discussion with patient.  Patient presented to [facility] on  01/01/22 due to Stroke. Patient was present for 2 days.  New?Medications Started at [facility] Discharge:?? -started  aspirin  Medication Changes at Hospital Discharge: -Changed  atorvastatin (LIPITOR) donepezil (Aricept)  Medications Discontinued at Hospital Discharge: -Stopped  None  Medications that remain the same after Hospital Discharge:??  -All other medications will remain the same.    Medications: Outpatient Encounter Medications as of 01/14/2022  Medication Sig   ACCU-CHEK AVIVA PLUS test strip TEST BLOOD SUGAR TWICE DAILY   aspirin 81 MG chewable tablet Chew 1 tablet (81 mg total) by mouth daily.   atorvastatin (LIPITOR) 80 MG tablet Take 1 tablet (80 mg total) by mouth daily.   Blood Glucose Monitoring Suppl (ACCU-CHEK AVIVA PLUS) w/Device KIT USE AS DIRECTED   citalopram (CELEXA) 20 MG tablet Take 1 tablet (20 mg total) by mouth daily.   clopidogrel (PLAVIX) 75 MG tablet Take 1 tablet (75 mg total) by mouth daily.   cyanocobalamin 1000 MCG tablet Take 1,000 mcg by mouth daily.   donepezil (ARICEPT) 5 MG tablet Take 1 tablet (5 mg total) by mouth at bedtime.   Dulaglutide (TRULICITY) 3 HA/1.9FX SOPN Inject 3 mg as directed once a week. (Patient taking differently: Inject 3 mg as directed every Monday.)   glipiZIDE (GLUCOTROL XL) 10 MG 24 hr tablet Take 1 tablet (10 mg total) by mouth daily with breakfast.   levocetirizine (XYZAL) 5 MG tablet TAKE ONE TABLET BY MOUTH EVERY EVENING. (Patient not taking:  Reported on 01/02/2022)   levothyroxine (SYNTHROID) 50 MCG tablet Take 1 tablet (50 mcg total) by mouth daily. (Patient taking differently: Take 50 mcg by mouth daily before breakfast.)   lisinopril (ZESTRIL) 40 MG tablet Take 1 tablet (40 mg total) by mouth daily.   memantine (NAMENDA) 10 MG tablet 1 TAB TWICE A DAY (Patient taking differently: Take 10 mg by mouth 2 (two) times daily.)   oxybutynin (DITROPAN XL) 10 MG 24 hr tablet Take 1 tablet (10 mg total) by mouth at bedtime.   pioglitazone (ACTOS) 30 MG tablet Take 1 tablet (30 mg total) by mouth daily.   Vibegron (GEMTESA) 75 MG TABS Take 75 mg by mouth daily.   No facility-administered encounter medications on file as of 01/14/2022.  Recent Relevant Labs: Lab Results  Component Value Date/Time   HGBA1C 9.3 (H) 01/02/2022 04:26 AM   HGBA1C 11.2 (A) 11/11/2021 11:03 AM   HGBA1C 8.3 (H) 08/19/2021 08:21 AM   HGBA1C 7.4 (A) 02/06/2020 09:17 AM   HGBA1C 6.7 07/14/2018 10:09 AM   MICROALBUR 0.3 03/23/2014 10:27 AM   MICROALBUR 1.3 09/18/2013 09:28 AM    Kidney Function Lab Results  Component Value Date/Time   CREATININE 1.42 (H) 01/03/2022 03:59 AM   CREATININE 1.70 (H) 01/01/2022 01:55 PM   CREATININE 1.52 (H) 07/17/2020 10:25 AM   GFR 41.50 (L) 08/19/2021 08:21 AM   GFRNONAA 49 (L) 01/03/2022 03:59 AM   GFRNONAA 42 (L) 07/17/2020 10:25 AM   GFRAA 49 (L) 07/17/2020 10:25 AM  Spoke to wife for call  Current antihyperglycemic  regimen:  Glipizide 10 mg 1 tablet daily - Appropriate, Query effective, Safe, Accessible Trulicity 3 mg inject once weekly - every Monday - Appropriate, Query effective, Safe, Accessible Pioglitazone 30 mg 1 tablet daily - Appropriate, Query effective, Safe, Accessible What recent interventions/DTPs have been made to improve glycemic control:  Wife reports no changes, they have a nurse that has been coming to check on her husband recently. Have there been any recent hospitalizations or ED visits since last  visit with CPP? Yes Patient denies hypoglycemic symptoms, including None Patient denies hyperglycemic symptoms, including none How often are you checking your blood sugar? Wife reports as far as she knows he hasn't been checking and is unsure of his last readings. Reports she will try and do so.  Adherence Review: Is the patient currently on a STATIN medication? No Is the patient currently on ACE/ARB medication? No Does the patient have >5 day gap between last estimated fill dates? No   Reviewed chart prior to disease state call. Spoke with patient regarding BP  Recent Office Vitals: BP Readings from Last 3 Encounters:  01/03/22 134/88  11/18/21 (!) 156/83  11/11/21 130/88   Pulse Readings from Last 3 Encounters:  01/03/22 87  11/18/21 100  11/11/21 83    Wt Readings from Last 3 Encounters:  01/02/22 207 lb 3.7 oz (94 kg)  11/18/21 206 lb (93.4 kg)  11/11/21 209 lb (94.8 kg)     Kidney Function Lab Results  Component Value Date/Time   CREATININE 1.42 (H) 01/03/2022 03:59 AM   CREATININE 1.70 (H) 01/01/2022 01:55 PM   CREATININE 1.52 (H) 07/17/2020 10:25 AM   GFR 41.50 (L) 08/19/2021 08:21 AM   GFRNONAA 49 (L) 01/03/2022 03:59 AM   GFRNONAA 42 (L) 07/17/2020 10:25 AM   GFRAA 49 (L) 07/17/2020 10:25 AM       Latest Ref Rng & Units 01/03/2022    3:59 AM 01/01/2022    1:55 PM 08/19/2021    8:21 AM  BMP  Glucose 70 - 99 mg/dL 96   130   126    BUN 8 - 23 mg/dL _0 Creatinine 0.61 - 1.24 mg/dL 1.42   1.70   1.55    Sodium 135 - 145 mmol/L 133   138   137    Potassium 3.5 - 5.1 mmol/L 3.9   4.1   3.9    Chloride 98 - 111 mmol/L 99   103   101    CO2 22 - 32 mmol/L _1 Calcium 8.9 - 10.3 mg/dL 9.2   9.1   9.5      Current antihypertensive regimen:  Lisinopril 40 mg 1 tablet daily - Appropriate, Query effective, Safe, Accessible How often are you checking your Blood Pressure? weekly Current home BP readings: Wife reports numbers by nurse on  today were 142/70 pulse 84 What recent interventions/DTPs have been made by any provider to improve Blood Pressure control since last CPP Visit: Wife reports none. Any recent hospitalizations or ED visits since last visit with CPP? Yes  Adherence Review: Is the patient currently on ACE/ARB medication? Yes Does the patient have >5 day gap between last estimated fill dates? No     Care Gaps: Zoster Vaccine - Overdue COVID Booster - Overdue Eye Exam - Overdue BP- 156/83 ( 11/18/21) AWV-  office aware to schedule as of 12/22 Lab Results  Component Value  Date   HGBA1C 9.3 (H) 01/02/2022    Star Rating Drugs: Atorvastatin 40 mg - Last filled 01/03/22 90 DS at [facility] Pioglitazone (Actos) 30 mg - Last filled 11/11/21 90 DS at [facility] Lisinopril(Zestril) 40 mg - Last filled 12/01/21 90 DS at [facility] Glipizide (Glucotrol) 10 mg - Last filled 11/11/20 90 DS   Duaglutide (Trulicity) 3 mg - Last filled 11/11/21 84 DS at [place] Pharmacist Assistant [phone]

## 2022-01-15 ENCOUNTER — Ambulatory Visit (INDEPENDENT_AMBULATORY_CARE_PROVIDER_SITE_OTHER): Payer: Medicare HMO

## 2022-01-15 DIAGNOSIS — I639 Cerebral infarction, unspecified: Secondary | ICD-10-CM

## 2022-01-15 DIAGNOSIS — I440 Atrioventricular block, first degree: Secondary | ICD-10-CM | POA: Diagnosis not present

## 2022-01-15 DIAGNOSIS — R001 Bradycardia, unspecified: Secondary | ICD-10-CM

## 2022-01-15 DIAGNOSIS — I4891 Unspecified atrial fibrillation: Secondary | ICD-10-CM | POA: Diagnosis not present

## 2022-01-16 ENCOUNTER — Ambulatory Visit (INDEPENDENT_AMBULATORY_CARE_PROVIDER_SITE_OTHER): Payer: Medicare HMO | Admitting: Adult Health

## 2022-01-16 ENCOUNTER — Encounter: Payer: Self-pay | Admitting: Adult Health

## 2022-01-16 VITALS — BP 120/70 | HR 79 | Temp 98.6°F | Ht 70.5 in | Wt 208.0 lb

## 2022-01-16 DIAGNOSIS — N1832 Chronic kidney disease, stage 3b: Secondary | ICD-10-CM | POA: Diagnosis not present

## 2022-01-16 DIAGNOSIS — I10 Essential (primary) hypertension: Secondary | ICD-10-CM | POA: Diagnosis not present

## 2022-01-16 DIAGNOSIS — I639 Cerebral infarction, unspecified: Secondary | ICD-10-CM

## 2022-01-16 DIAGNOSIS — E1165 Type 2 diabetes mellitus with hyperglycemia: Secondary | ICD-10-CM

## 2022-01-16 DIAGNOSIS — R001 Bradycardia, unspecified: Secondary | ICD-10-CM | POA: Diagnosis not present

## 2022-01-16 DIAGNOSIS — E039 Hypothyroidism, unspecified: Secondary | ICD-10-CM

## 2022-01-16 DIAGNOSIS — F03B18 Unspecified dementia, moderate, with other behavioral disturbance: Secondary | ICD-10-CM

## 2022-01-16 DIAGNOSIS — D509 Iron deficiency anemia, unspecified: Secondary | ICD-10-CM

## 2022-01-16 DIAGNOSIS — I4891 Unspecified atrial fibrillation: Secondary | ICD-10-CM | POA: Diagnosis not present

## 2022-01-16 LAB — CBC WITH DIFFERENTIAL/PLATELET
Basophils Absolute: 0 10*3/uL (ref 0.0–0.1)
Basophils Relative: 0.4 % (ref 0.0–3.0)
Eosinophils Absolute: 0.3 10*3/uL (ref 0.0–0.7)
Eosinophils Relative: 5.3 % — ABNORMAL HIGH (ref 0.0–5.0)
HCT: 33.3 % — ABNORMAL LOW (ref 39.0–52.0)
Hemoglobin: 10.8 g/dL — ABNORMAL LOW (ref 13.0–17.0)
Lymphocytes Relative: 18.6 % (ref 12.0–46.0)
Lymphs Abs: 1 10*3/uL (ref 0.7–4.0)
MCHC: 32.4 g/dL (ref 30.0–36.0)
MCV: 89.9 fl (ref 78.0–100.0)
Monocytes Absolute: 0.4 10*3/uL (ref 0.1–1.0)
Monocytes Relative: 6.9 % (ref 3.0–12.0)
Neutro Abs: 3.7 10*3/uL (ref 1.4–7.7)
Neutrophils Relative %: 68.8 % (ref 43.0–77.0)
Platelets: 223 10*3/uL (ref 150.0–400.0)
RBC: 3.7 Mil/uL — ABNORMAL LOW (ref 4.22–5.81)
RDW: 14.2 % (ref 11.5–15.5)
WBC: 5.4 10*3/uL (ref 4.0–10.5)

## 2022-01-16 LAB — BASIC METABOLIC PANEL
BUN: 22 mg/dL (ref 6–23)
CO2: 28 mEq/L (ref 19–32)
Calcium: 9.3 mg/dL (ref 8.4–10.5)
Chloride: 103 mEq/L (ref 96–112)
Creatinine, Ser: 1.56 mg/dL — ABNORMAL HIGH (ref 0.40–1.50)
GFR: 41.06 mL/min — ABNORMAL LOW (ref >60.00)
Glucose, Bld: 176 mg/dL — ABNORMAL HIGH (ref 70–99)
Potassium: 4 mEq/L (ref 3.5–5.1)
Sodium: 138 mEq/L (ref 135–145)

## 2022-01-16 LAB — TSH: TSH: 4.07 u[IU]/mL (ref 0.35–5.50)

## 2022-01-16 NOTE — Patient Instructions (Addendum)
It was great seeing you today   I am glad you are feeling better.   To his family - please make sure he is taking his medications as directed - he needs a low carb/low sugar diet.   Look for recipes related to " diabetic diet" - you can google this and find a bunch of recipies or get cook books designed for people with diabetes.   I will follow up with you about the blood work   I will see you back in one month

## 2022-01-16 NOTE — Progress Notes (Signed)
Subjective:    Patient ID: Scott Vasquez, male    DOB: November 01, 1940, 81 y.o.   MRN: [id number]  HPI 81 year old male who  has a past medical history of Anemia, Diabetes mellitus, Diverticulosis of colon, Hypertension, Stroke ([place]), and Thyroid nodule.  He presents to the office today for TCM visit   Admit Date 01/01/2022 Discharge Date 01/03/2022  He presented to the emergency room with difficulty speaking for several days.  His initial CT head showed no acute findings.  Subsequent MRI of the brain showed acute and/or subacute infarcts in the right medial frontal gyrus, left inferior frontal gyrus, left body of the corpus callosum, left basal ganglia, and left cerebral thalamus.  Neurology was consulted and stroke work-up was pursued for bilateral multifocal strokes.  Hospital Course   Multifocal subcortical strokes, small vessel disease versus cardioembolic secondary to aortic atherosclerosis. -MRA reveals right P3 stenosis, bilateral ICA tortuosity.  No cardioembolic source on TTE. 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  Compliance has been an issue in the past.  Given his cognitive impairment and normal fasting CBG, he was no initiation of additional treatment.  Sinus bradycardia -Degree AV block, rare second-degree AV block that was seen nocturnally while sleeping on telemetry. -Aricept was decreased as this was his only provocative agent. -Soon for subtherapeutic Synthroid dosing with elevated TSH, he has been taking his Synthroid inconsistently at home.  Hyperlipidemia -Continue statin medication  Iron deficiency anemia -Stable  Essential hypertension -BP is elevated with permissive  hypertension.  He was restarted on home meds at discharge  Moderate dementia with behavioral disturbance -Creased Aricept due to bradycardia/heart block.  He was continued on Namenda  CKD stage IIIb -Stable   Hypothyroidism  -We will need to recheck TFTs.  Will remain on Synthroid 50 mcg for now  Today he is with his wife who helps supplement history.  He reports that he has been doing well since being out of the hospital.  Physical therapy/Occupational Therapy has come into the home and is working with him.  Speech therapy will start early next week.  He has not had any trouble swallowing but does have some slurred speech and difficulty forming words.  This seems to be his only deficit.  He has not noticed any issues with strength or gait.   He is monitoring his blood sugars at home and reports readings in the 110s to 130s.  His wife agrees with this.  She has been helping to manage his medications more closely.  His son is also helping out with this.  He has received 2 his cardiac monitor and is currently wearing it.  Has a follow-up appointment with neurology in early August.  His wife reports that overall she feels as though he is doing fairly well.  Has no new complaints since being discharged from the hospital  Review of Systems  Constitutional: Negative.   HENT: Negative.    Eyes: Negative.   Respiratory: Negative.    Cardiovascular: Negative.   Gastrointestinal: Negative.   Endocrine: Negative.   Genitourinary: Negative.   Musculoskeletal: Negative.   Skin: Negative.   Allergic/Immunologic: Negative.   Neurological:  Positive for speech difficulty.  Hematological: Negative.   Psychiatric/Behavioral: Negative.  All other systems reviewed and are negative.  Past Medical History:  Diagnosis Date   Anemia    iron deficiency   Diabetes mellitus    type II   Diverticulosis of colon    Hypertension    Stroke (HCC)    numbness in left hand   Thyroid nodule      Social History   Socioeconomic History   Marital status: Married    Spouse name: Not on file   Number of children: 2   Years of education: 8   Highest education level: Not on file  Occupational History   Occupation: retired    Fish farm manager: [employer]   Smoking status: Former    Packs/day: 0.50    Years: 20.00    Total pack years: 10.00    Types: Cigarettes    Start date: 14    Quit date: 08/10/1958    Years since quitting: 63.4   Smokeless tobacco: Never  Vaping Use   Vaping Use: Never used  Substance and Sexual Activity   Alcohol use: No   Drug use: No   Sexual activity: Not on file  Other Topics Concern   Not on file  Social History Narrative   Pt is R handed   Lives in single story home with his wife, Scott Vasquez   2 adult children   8th grade education   Retired Theatre stage manager from [employer]   Married 75 years   Social Determinants of Health   Financial Resource Strain: [place]  (05/09/2021)   Overall Financial Resource Strain (CARDIA)    Difficulty of Paying Living Expenses: Not hard at all  [place]: Not on file  Transportation Needs: No Transportation Needs (05/09/2021)   PRAPARE - Hydrologist (Medical): No    Lack of Transportation (Non-Medical): No  Physical Activity: Not on file  Stress: Not on file  Social Connections: Not on file  Intimate Partner Violence: Not on file    Past Surgical History:  Procedure Laterality Date   BACK SURGERY     CATARACT EXTRACTION, BILATERAL     SHOULDER SURGERY  08/2007   Supple   THYROID LOBECTOMY Left 04/29/2015   THYROID LOBECTOMY Left 04/29/2015   Procedure: THYROID LOBECTOMY;  Surgeon: Armandina Gemma, MD;  Location: [facility];  Service: General;  Laterality: Left;    Family History  Problem Relation Age of Onset   Diabetes Other    Diabetes Father        Died from diabetic coma in 1970/09/23   Hypertension Father    Stroke Mother        Died in 64   Hypertension Mother      Allergies  Allergen Reactions   Actos [Pioglitazone] Other (See Comments)    Diarrhea    Metformin And Related Other (See Comments)    Diarrhea      Current Outpatient Medications on File Prior to Visit  Medication Sig Dispense Refill   ACCU-CHEK AVIVA PLUS test strip TEST BLOOD SUGAR TWICE DAILY 200 strip 3   aspirin 81 MG chewable tablet Chew 1 tablet (81 mg total) by mouth daily. 30 tablet 2   atorvastatin (LIPITOR) 80 MG tablet Take 1 tablet (80 mg total) by mouth daily. 30 tablet 2   Blood Glucose Monitoring Suppl (ACCU-CHEK AVIVA PLUS) w/Device KIT USE AS DIRECTED 1 kit 0   citalopram (CELEXA) 20 MG tablet Take 1 tablet (20 mg total) by mouth daily. 90 tablet  3   clopidogrel (PLAVIX) 75 MG tablet Take 1 tablet (75 mg total) by mouth daily. 21 tablet 0   cyanocobalamin 1000 MCG tablet Take 1,000 mcg by mouth daily.     donepezil (ARICEPT) 5 MG tablet Take 1 tablet (5 mg total) by mouth at bedtime. 30 tablet 0   Dulaglutide (TRULICITY) 3 WL/7.9GX SOPN Inject 3 mg as directed once a week. (Patient taking differently: Inject 3 mg as directed every Monday.) 6 mL 1   glipiZIDE (GLUCOTROL XL) 10 MG 24 hr tablet Take 1 tablet (10 mg total) by mouth daily with breakfast. 90 tablet 1   levocetirizine (XYZAL) 5 MG tablet TAKE ONE TABLET BY MOUTH EVERY EVENING. 30 tablet 5   levothyroxine (SYNTHROID) 50 MCG tablet Take 1 tablet (50 mcg total) by mouth daily. (Patient taking differently: Take 50 mcg by mouth daily before breakfast.) 90 tablet 3   lisinopril (ZESTRIL) 40 MG tablet Take 1 tablet (40 mg total) by mouth daily. 90 tablet 3   memantine (NAMENDA) 10 MG tablet 1 TAB TWICE A DAY (Patient taking differently: Take 10 mg by mouth 2 (two) times daily.) 180 tablet 3   oxybutynin (DITROPAN XL) 10 MG 24 hr tablet Take 1 tablet (10 mg total) by mouth at bedtime. 90 tablet 0   pioglitazone (ACTOS) 30 MG tablet Take 1 tablet (30 mg total) by mouth daily. 90 tablet 1   Vibegron (GEMTESA) 75 MG  TABS Take 75 mg by mouth daily.     No current facility-administered medications on file prior to visit.    BP 120/70   Pulse 79   Temp 98.6 F (37 C) (Oral)   Ht 5' 10.5" (1.791 m)   Wt 208 lb (94.3 kg)   SpO2 98%   BMI 29.42 kg/m       Objective:   Physical Exam Vitals and nursing note reviewed.  Constitutional:      General: He is not in acute distress.    Appearance: Normal appearance. He is well-developed and normal weight.  HENT:     Head: Normocephalic and atraumatic.     Right Ear: Tympanic membrane, ear canal and external ear normal. There is no impacted cerumen.     Left Ear: Tympanic membrane, ear canal and external ear normal. There is no impacted cerumen.     Nose: Nose normal. No congestion or rhinorrhea.     Mouth/Throat:     Mouth: Mucous membranes are moist.     Pharynx: Oropharynx is clear. No oropharyngeal exudate or posterior oropharyngeal erythema.  Eyes:     General:        Right eye: No discharge.        Left eye: No discharge.     Extraocular Movements: Extraocular movements intact.     Conjunctiva/sclera: Conjunctivae normal.     Pupils: Pupils are equal, round, and reactive to light.  Neck:     Vascular: No carotid bruit.     Trachea: No tracheal deviation.  Cardiovascular:     Rate and Rhythm: Normal rate and regular rhythm.     Pulses: Normal pulses.     Heart sounds: Normal heart sounds. No murmur heard.    No friction rub. No gallop.     Comments: Cardiac monitor in place  Pulmonary:     Effort: Pulmonary effort is normal. No respiratory distress.     Breath sounds: Normal breath sounds. No stridor. No wheezing, rhonchi or rales.  Chest:  Chest wall: No tenderness.  Abdominal:     General: Bowel sounds are normal. There is no distension.     Palpations: Abdomen is soft. There is no mass.     Tenderness: There is no abdominal tenderness. There is no right CVA tenderness, left CVA tenderness, guarding or rebound.     Hernia: No  hernia is present.  Musculoskeletal:        General: No swelling, tenderness, deformity or signs of injury. Normal range of motion.     Right lower leg: No edema.     Left lower leg: No edema.  Lymphadenopathy:     Cervical: No cervical adenopathy.  Skin:    General: Skin is warm and dry.     Capillary Refill: Capillary refill takes less than 2 seconds.     Coloration: Skin is not jaundiced or pale.     Findings: No bruising, erythema, lesion or rash.  Neurological:     General: No focal deficit present.     Mental Status: He is alert and oriented to person, place, and time.     Cranial Nerves: Dysarthria present. No cranial nerve deficit.     Sensory: No sensory deficit.     Motor: Atrophy present. No weakness or tremor.     Coordination: Coordination is intact. Coordination normal.     Gait: Gait is intact. Gait normal.     Deep Tendon Reflexes: Reflexes are normal and symmetric. Reflexes normal.  Psychiatric:        Mood and Affect: Mood normal.        Behavior: Behavior normal.        Thought Content: Thought content normal.        Judgment: Judgment normal.       Assessment & Plan:  1. Cerebrovascular accident (CVA), unspecified mechanism ([place]) - Reviewed hospital notes, imagining, labs, and discharge instructions with patient and his wife. All questions answered to the best of my ability.  - Family was encouraged to help manage his medications - needs to be on a heart healthy diabetic diet to bring down his blood sugars  - Will have him follow up in one month or sooner if needed - CBC with Differential/Platelet; Future - Basic Metabolic Panel; Future - TSH; Future  2. Hypothyroidism, unspecified type - Recheck TSH today  - CBC with Differential/Platelet; Future - Basic Metabolic Panel; Future - TSH; Future  3. Stage 3b chronic kidney disease (HCC) - Stable  - CBC with Differential/Platelet; Future - Basic Metabolic Panel; Future - TSH; Future  4. Moderate  dementia with behavioral disturbance (HCC) - Continue aricpet 5 mg and Namenda  - CBC with Differential/Platelet; Future - Basic Metabolic Panel; Future - TSH; Future  5. Uncontrolled type 2 diabetes mellitus with hyperglycemia (HCC) - Continue current medications  - CBC with Differential/Platelet; Future - Basic Metabolic Panel; Future - TSH; Future  6. Essential hypertension - well controlled.  - CBC with Differential/Platelet; Future - Basic Metabolic Panel; Future - TSH; Future  7. Iron deficiency anemia, unspecified iron deficiency anemia type - stable   8. Bradycardia - resolved    [name], NP

## 2022-02-06 ENCOUNTER — Telehealth: Payer: Self-pay | Admitting: Adult Health

## 2022-02-06 NOTE — Telephone Encounter (Signed)
Pt's spouse called to say Pt is having trouble moving his bowels and asked if Tommi Rumps can recommend something for Pt, or if someone can call her back.  [phone]  Please advise.

## 2022-02-11 ENCOUNTER — Ambulatory Visit: Payer: Medicare HMO | Admitting: Adult Health

## 2022-02-11 NOTE — Telephone Encounter (Signed)
Pt is scheduled °

## 2022-02-12 ENCOUNTER — Ambulatory Visit (INDEPENDENT_AMBULATORY_CARE_PROVIDER_SITE_OTHER): Payer: Medicare HMO | Admitting: Adult Health

## 2022-02-12 ENCOUNTER — Encounter: Payer: Self-pay | Admitting: Adult Health

## 2022-02-12 VITALS — BP 122/72 | HR 91 | Temp 98.6°F | Ht 70.5 in | Wt 206.4 lb

## 2022-02-12 DIAGNOSIS — Z79899 Other long term (current) drug therapy: Secondary | ICD-10-CM

## 2022-02-12 DIAGNOSIS — E1165 Type 2 diabetes mellitus with hyperglycemia: Secondary | ICD-10-CM | POA: Diagnosis not present

## 2022-02-12 DIAGNOSIS — E039 Hypothyroidism, unspecified: Secondary | ICD-10-CM | POA: Diagnosis not present

## 2022-02-12 NOTE — Progress Notes (Signed)
Subjective:    Patient ID: Scott Vasquez, male    DOB: September 27, 1940, 81 y.o.   MRN: [id number]  HPI 81 year old male who  has a past medical history of Anemia, Diabetes mellitus, Diverticulosis of colon, Hypertension, Stroke ([place]), and Thyroid nodule.  He presents to the office today for one month follow up regarding medication management this is likely related to CVA, hypothyroidism, and diabetes mellitus.  His son is with him today to help provide history.  He was recently in the hospital diagnosed with a CVA.  At this time there was concern that he was not taking his medications as directed.  His TSH was elevated and his A1c was elevated at 9.3.  I asked his family members to help manage his medication and follow-up with him in 1 month to see how its going.  Today his son reports that they are managing his medications and he is taking his medications as directed daily.  His blood sugars have been better controlled with readings mostly in the 130s.  He has not had any hypoglycemic events.  Family has not noticed any change in his behavior or mood.  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   Smoking status: Former    Packs/day: 0.50    Years: 20.00    Total pack years: 10.00    Types: Cigarettes    Start date: 47    Quit date: 08/10/1958    Years since quitting: 63.5   Smokeless tobacco: Never  Vaping Use   Vaping Use: Never used  Substance and Sexual Activity   Alcohol use: No   Drug use: No   Sexual activity: Not on file   Other Topics Concern   Not on file  Social History Narrative   Pt is R handed   Lives in single story home with his wife, Scott Vasquez   2 adult children   8th grade education   Retired Theatre stage manager from [employer]   Married 61 years   Social Determinants of Health   Financial Resource Strain: [place]  (05/09/2021)   Overall Financial Resource Strain (CARDIA)    Difficulty of Paying Living Expenses: Not hard at all  [place]: Not on file  Transportation Needs: No Transportation Needs (05/09/2021)   PRAPARE - Hydrologist (Medical): No    Lack of Transportation (Non-Medical): No  Physical Activity: Not on file  Stress: Not on file  Social Connections: Not on file  Intimate Partner Violence: Not on file    Past Surgical History:  Procedure Laterality Date   BACK SURGERY     CATARACT EXTRACTION, BILATERAL     SHOULDER SURGERY  08/2007   Supple   THYROID LOBECTOMY Left 04/29/2015   THYROID LOBECTOMY Left 04/29/2015   Procedure: THYROID LOBECTOMY;  Surgeon: Armandina Gemma, MD;  Location: [facility];  Service: General;  Laterality: Left;    Family History  Problem Relation Age of Onset   Diabetes Other    Diabetes Father        Died from diabetic coma in 1970/10/25   Hypertension Father    Stroke Mother        Died in 54   Hypertension Mother     Allergies  Allergen Reactions   Actos [Pioglitazone] Other (See Comments)    Diarrhea    Metformin And Related Other (See Comments)    Diarrhea      Current Outpatient Medications on File Prior to Visit  Medication Sig Dispense Refill   ACCU-CHEK AVIVA PLUS test strip TEST BLOOD SUGAR TWICE DAILY 200 strip 3   aspirin 81 MG chewable tablet Chew 1 tablet (81 mg total) by mouth daily. 30 tablet 2   atorvastatin (LIPITOR) 80 MG tablet Take 1 tablet (80 mg total) by mouth daily. 30 tablet 2   Blood Glucose Monitoring Suppl (ACCU-CHEK AVIVA PLUS) w/Device KIT USE AS DIRECTED 1 kit 0   citalopram (CELEXA) 20  MG tablet Take 1 tablet (20 mg total) by mouth daily. 90 tablet 3   clopidogrel (PLAVIX) 75 MG tablet Take 1 tablet (75 mg total) by mouth daily. 21 tablet 0   cyanocobalamin 1000 MCG tablet Take 1,000 mcg by mouth daily.     donepezil (ARICEPT) 5 MG tablet Take 1 tablet (5 mg total) by mouth at bedtime. 30 tablet 0   glipiZIDE (GLUCOTROL XL) 10 MG 24 hr tablet Take 1 tablet (10 mg total) by mouth daily with breakfast. 90 tablet 1   levocetirizine (XYZAL) 5 MG tablet TAKE ONE TABLET BY MOUTH EVERY EVENING. 30 tablet 5   levothyroxine (SYNTHROID) 50 MCG tablet Take 1 tablet (50 mcg total) by mouth daily. (Patient taking differently: Take 50 mcg by mouth daily before breakfast.) 90 tablet 3   lisinopril (ZESTRIL) 40 MG tablet Take 1 tablet (40 mg total) by mouth daily. 90 tablet 3   memantine (NAMENDA) 10 MG tablet 1 TAB TWICE A DAY (Patient taking differently: Take 10 mg by mouth 2 (two) times daily.) 180 tablet 3   oxybutynin (DITROPAN XL) 10 MG 24 hr tablet Take 1 tablet (10 mg total) by mouth at bedtime. 90 tablet 0   pioglitazone (ACTOS) 30 MG tablet Take 1 tablet (30 mg total) by mouth daily. 90 tablet 1   Vibegron (GEMTESA) 75 MG TABS Take 75 mg by mouth daily.     No current facility-administered medications on file prior to visit.    BP 122/72 (BP Location: Left Arm, Patient Position: Sitting, Cuff Size: Normal)   Pulse 91   Temp 98.6 F (37 C) (Oral)   Ht 5' 10.5" (1.791 m)   Wt 206 lb 6.4 oz (93.6 kg)   SpO2 96%   BMI 29.20 kg/m       Objective:   Physical Exam Vitals and nursing note reviewed.  Constitutional:      Appearance: Normal appearance.  Cardiovascular:     Rate and Rhythm: Normal rate and regular rhythm.     Pulses: Normal pulses.     Heart sounds: Normal heart sounds.  Pulmonary:     Effort: Pulmonary effort is normal.     Breath sounds: Normal breath sounds.  Musculoskeletal:        General: Normal range of motion.  Skin:    General: Skin is warm and  dry.  Neurological:     General:  No focal deficit present.     Mental Status: He is alert and oriented to person, place, and time.  Psychiatric:        Mood and Affect: Mood normal.        Behavior: Behavior normal.        Thought Content: Thought content normal.        Cognition and Memory: Cognition is impaired. Memory is impaired.        Judgment: Judgment normal.       Assessment & Plan:  1. Hypothyroidism, unspecified type  - TSH; Future  2. Uncontrolled type 2 diabetes mellitus with hyperglycemia ([place])  - Basic Metabolic Panel; Future - Follow up in 2 months for repeat A1c Lab Results  Component Value Date   HGBA1C 9.3 (H) 01/02/2022    3. Medication management - Advised family to continue to manage patients medications.   [name], NP

## 2022-02-24 DIAGNOSIS — M79676 Pain in unspecified toe(s): Secondary | ICD-10-CM | POA: Diagnosis not present

## 2022-02-24 DIAGNOSIS — L84 Corns and callosities: Secondary | ICD-10-CM | POA: Diagnosis not present

## 2022-02-24 DIAGNOSIS — E1142 Type 2 diabetes mellitus with diabetic polyneuropathy: Secondary | ICD-10-CM | POA: Diagnosis not present

## 2022-02-24 DIAGNOSIS — B351 Tinea unguium: Secondary | ICD-10-CM | POA: Diagnosis not present

## 2022-03-05 ENCOUNTER — Telehealth: Payer: Self-pay | Admitting: Adult Health

## 2022-03-05 NOTE — Telephone Encounter (Signed)
Paperwork was dropped off for patient to be completed by pcp. The paperwork was placed in red folder to be completed. Will need to be faxed upon completion.          Please advise

## 2022-03-05 NOTE — Telephone Encounter (Signed)
PPW placed on provider's desk.

## 2022-03-06 NOTE — Telephone Encounter (Signed)
Pt daughter notified that PPW was faxed. No other action needed!

## 2022-03-06 NOTE — Telephone Encounter (Signed)
PPW has been faxed to provided number on form

## 2022-03-16 ENCOUNTER — Other Ambulatory Visit: Payer: Self-pay | Admitting: Adult Health

## 2022-03-18 ENCOUNTER — Ambulatory Visit: Payer: Medicare HMO | Admitting: Neurology

## 2022-03-18 ENCOUNTER — Encounter: Payer: Self-pay | Admitting: Neurology

## 2022-03-18 VITALS — BP 143/75 | HR 72 | Ht 70.0 in | Wt 202.6 lb

## 2022-03-18 DIAGNOSIS — R4701 Aphasia: Secondary | ICD-10-CM | POA: Diagnosis not present

## 2022-03-18 DIAGNOSIS — F03A0 Unspecified dementia, mild, without behavioral disturbance, psychotic disturbance, mood disturbance, and anxiety: Secondary | ICD-10-CM

## 2022-03-18 NOTE — Patient Instructions (Signed)
I had a long d/w patient about his recent  cryptogenic strokes, dementia, risk for recurrent stroke/TIAs, personally independently reviewed imaging studies and stroke evaluation results and answered questions.Continue aspirin 81 mg daily  for secondary stroke prevention and maintain strict control of hypertension with blood pressure goal below 130/90, diabetes with hemoglobin A1c goal below 6.5% and lipids with LDL cholesterol goal below 70 mg/dL. I also advised the patient to eat a healthy diet with plenty of whole grains, cereals, fruits and vegetables, exercise regularly and maintain ideal body weight .refer to outpatient speech therapy for aphasia.  Continue on Namenda and Aricept for his dementia and follow-up with his neurologist Dr. Delice Lesch in the future.  No scheduled follow-up appointment with me is necessary.     Stroke Prevention Some medical conditions and behaviors can lead to a higher chance of having a stroke. You can help prevent a stroke by eating healthy, exercising, not smoking, and managing any medical conditions you have. Stroke is a leading cause of functional impairment. Primary prevention is particularly important because a majority of strokes are first-time events. Stroke changes the lives of not only those who experience a stroke but also their family and other caregivers. How can this condition affect me? A stroke is a medical emergency and should be treated right away. A stroke can lead to brain damage and can sometimes be life-threatening. If a person gets medical treatment right away, there is a better chance of surviving and recovering from a stroke. What can increase my risk? The following medical conditions may increase your risk of a stroke: Cardiovascular disease. High blood pressure (hypertension). Diabetes. High cholesterol. Sickle cell disease. Blood clotting disorders (hypercoagulable state). Obesity. Sleep disorders (obstructive sleep apnea). Other risk factors  include: Being older than age 21. Having a history of blood clots, stroke, or mini-stroke (transient ischemic attack, TIA). Genetic factors, such as race, ethnicity, or a family history of stroke. Smoking cigarettes or using other tobacco products. Taking birth control pills, especially if you also use tobacco. Heavy use of alcohol or drugs, especially cocaine and methamphetamine. Physical inactivity. What actions can I take to prevent this? Manage your health conditions High cholesterol levels. Eating a healthy diet is important for preventing high cholesterol. If cholesterol cannot be managed through diet alone, you may need to take medicines. Take any prescribed medicines to control your cholesterol as told by your health care provider. Hypertension. To reduce your risk of stroke, try to keep your blood pressure below 130/80. Eating a healthy diet and exercising regularly are important for controlling blood pressure. If these steps are not enough to manage your blood pressure, you may need to take medicines. Take any prescribed medicines to control hypertension as told by your health care provider. Ask your health care provider if you should monitor your blood pressure at home. Have your blood pressure checked every year, even if your blood pressure is normal. Blood pressure increases with age and some medical conditions. Diabetes. Eating a healthy diet and exercising regularly are important parts of managing your blood sugar (glucose). If your blood sugar cannot be managed through diet and exercise, you may need to take medicines. Take any prescribed medicines to control your diabetes as told by your health care provider. Get evaluated for obstructive sleep apnea. Talk to your health care provider about getting a sleep evaluation if you snore a lot or have excessive sleepiness. Make sure that any other medical conditions you have, such as atrial fibrillation or atherosclerosis,  are  managed. Nutrition Follow instructions from your health care provider about what to eat or drink to help manage your health condition. These instructions may include: Reducing your daily calorie intake. Limiting how much salt (sodium) you use to 1,500 milligrams (mg) each day. Using only healthy fats for cooking, such as olive oil, canola oil, or sunflower oil. Eating healthy foods. You can do this by: Choosing foods that are high in fiber, such as whole grains, and fresh fruits and vegetables. Eating at least 5 servings of fruits and vegetables a day. Try to fill one-half of your plate with fruits and vegetables at each meal. Choosing lean protein foods, such as lean cuts of meat, poultry without skin, fish, tofu, beans, and nuts. Eating low-fat dairy products. Avoiding foods that are high in sodium. This can help lower blood pressure. Avoiding foods that have saturated fat, trans fat, and cholesterol. This can help prevent high cholesterol. Avoiding processed and prepared foods. Counting your daily carbohydrate intake.  Lifestyle If you drink alcohol: Limit how much you have to: 0-1 drink a day for women who are not pregnant. 0-2 drinks a day for men. Know how much alcohol is in your drink. In the [place] one drink equals one 12 oz bottle of beer (347m), one 5 oz glass of wine (1433m, or one 1 oz glass of hard liquor (4435m Do not use any products that contain nicotine or tobacco. These products include cigarettes, chewing tobacco, and vaping devices, such as e-cigarettes. If you need help quitting, ask your health care provider. Avoid secondhand smoke. Do not use drugs. Activity  Try to stay at a healthy weight. Get at least 30 minutes of exercise on most days, such as: Fast walking. Biking. Swimming. Medicines Take over-the-counter and prescription medicines only as told by your health care provider. Aspirin or blood thinners (antiplatelets or anticoagulants) may be recommended  to reduce your risk of forming blood clots that can lead to stroke. Avoid taking birth control pills. Talk to your health care provider about the risks of taking birth control pills if: You are over 35 13ars old. You smoke. You get very bad headaches. You have had a blood clot. Where to find more information American Stroke Association: www.strokeassociation.org Get help right away if: You or a loved one has any symptoms of a stroke. "BE FAST" is an easy way to remember the main warning signs of a stroke: B - Balance. Signs are dizziness, sudden trouble walking, or loss of balance. E - Eyes. Signs are trouble seeing or a sudden change in vision. F - Face. Signs are sudden weakness or numbness of the face, or the face or eyelid drooping on one side. A - Arms. Signs are weakness or numbness in an arm. This happens suddenly and usually on one side of the body. S - Speech. Signs are sudden trouble speaking, slurred speech, or trouble understanding what people say. T - Time. Time to call emergency services. Write down what time symptoms started. You or a loved one has other signs of a stroke, such as: A sudden, severe headache with no known cause. Nausea or vomiting. Seizure. These symptoms may represent a serious problem that is an emergency. Do not wait to see if the symptoms will go away. Get medical help right away. Call your local emergency services (911 in the [place] Do not drive yourself to the hospital. Summary You can help to prevent a stroke by eating healthy, exercising, not smoking, limiting alcohol intake,  and managing any medical conditions you may have. Do not use any products that contain nicotine or tobacco. These include cigarettes, chewing tobacco, and vaping devices, such as e-cigarettes. If you need help quitting, ask your health care provider. Remember "BE FAST" for warning signs of a stroke. Get help right away if you or a loved one has any of these signs. This information  is not intended to replace advice given to you by your health care provider. Make sure you discuss any questions you have with your health care provider. Document Revised: 02/26/2020 Document Reviewed: 02/26/2020 Elsevier Patient Education  [place].

## 2022-03-18 NOTE — Progress Notes (Signed)
[facility] [address] [place] [address] [phone]       OFFICE FOLLOW-UP NOTE  Mr. Alessio Bogan Dascoli Date of Birth:  1941-03-27 Medical Record Number:  [id number]   HPI: Mr. Lobos is a 81 year old male seen today for initial office follow-up visit following hospital consultation for stroke and May 2023.  He is accompanied by his wife.  History is obtained from them and review of electronic medical records and I personally reviewed pertinent available imaging films in PACS. Ziyan Schoon Coval is a 81 y.o. male with prior strokes (residual left hand numbness/tingling), hypertension, hyperlipidemia (uncontrolled with LDL 180 in January), diabetes (uncontrolled with A1c 11.2% last month), vascular.   hypothyroidism.He was last seen by neurology/06/2022 at which time he noted that he was still driving short distances always accompanied by his wife but getting tired and having to pull the car off the road to rest, showing less interest in mowing the grass or ambulating, spending most of his day watching TV and not participating in activities outside the house with his wife managing medications as well as finances, laying out his clothes for him although he is independent in being able to use the shower he has to be reminded to do so and his clothes are set out for him by his wife.  He does not cook and has been having some issues with incontinence.  His MRI scan of the brain showed multifocal subcortical strokes in different vascular distributions right medial frontal, left anterior frontal, left body of corpus callosum, left basal ganglia, left cerebral peduncle mostly small vessel type Uncontrolled vascular risk factors.  MR angiogram of brain and neck did not show large vessel stenosis or occlusion.  Transthoracic echo showed ejection fraction of 60 to 65%.  LDL cholesterol was elevated at 180 mg percent and hemoglobin A1c at 9.3.  Patient subsequently had outpatient 30-day cardiac  monitoring and no evidence of atrial fibrillation was found.  He was put on dual antiplatelet therapy aspirin and Plavix followed by aspirin alone.  He is currently living at home with his wife.  He is finished home speech therapy but still struggles to speak and has persistent aphasia.  Wife feels that he may have some cognitive decline based on Mini-Mental status testing today scored 21/30 which is stable last testing Dr. Amparo [place] office.  He remains on Namenda 10 mg twice daily and Aricept 5 mg at bedtime for his mild vascular dementia.  Patient and wife are interested in doing outpatient speech therapy and would like a referral for the same.  He is tolerating aspirin well without bleeding or bruising and Lipitor without muscle aches and pains.  Blood pressure appears to be well-controlled today it is 143/70.  He states his sugars are doing better no follow-up A1c has not been checked  ROS:   14 system review of systems is positive for memory loss, speech difficulty, slurred speech all other systems negative  PMH:  Past Medical History:  Diagnosis Date   Anemia    iron deficiency   Diabetes mellitus    type II   Diverticulosis of colon    Hypertension    Stroke (HCC)    numbness in left hand   Thyroid nodule     Social History:  Social History   Socioeconomic History   Marital status: Married    Spouse name: Not on file   Number of children: 2   Years of education: 8   Highest education  level: Not on file  Occupational History   Occupation: retired    Fish farm manager: [employer]   Smoking status: Former    Packs/day: 0.50    Years: 20.00    Total pack years: 10.00    Types: Cigarettes    Start date: 31    Quit date: 08/10/1958    Years since quitting: 63.6   Smokeless tobacco: Never  Vaping Use   Vaping Use: Never used  Substance and Sexual Activity   Alcohol use: No   Drug use: No   Sexual activity: Not on file  Other Topics Concern   Not on file  Social  History Narrative   Pt is R handed   Lives in single story home with his wife, [name]   2 adult children   8th grade education   Retired Theatre stage manager from [employer]   Married 28 years   Social Determinants of Health   Financial Resource Strain: [place]  (05/09/2021)   Overall Financial Resource Strain ([place])    Difficulty of Paying Living Expenses: Not hard at all  [place]: Not on file  Transportation Needs: No Transportation Needs (05/09/2021)   PRAPARE - Hydrologist (Medical): No    Lack of Transportation (Non-Medical): No  Physical Activity: Not on file  Stress: Not on file  Social Connections: Not on file  Intimate Partner Violence: Not on file    Medications:   Current Outpatient Medications on File Prior to Visit  Medication Sig Dispense Refill   ACCU-CHEK AVIVA PLUS test strip TEST BLOOD SUGAR TWICE DAILY 200 strip 3   aspirin 81 MG chewable tablet Chew 1 tablet (81 mg total) by mouth daily. 30 tablet 2   atorvastatin (LIPITOR) 80 MG tablet Take 1 tablet (80 mg total) by mouth daily. 30 tablet 2   Blood Glucose Monitoring Suppl (ACCU-CHEK GUIDE) w/Device KIT USE AS DIRECTED 1 kit 0   citalopram (CELEXA) 20 MG tablet Take 1 tablet (20 mg total) by mouth daily. 90 tablet 3   cyanocobalamin 1000 MCG tablet Take 1,000 mcg by mouth daily.     donepezil (ARICEPT) 5 MG tablet Take 1 tablet (5 mg total) by mouth at bedtime. 30 tablet 0   glipiZIDE (GLUCOTROL XL) 10 MG 24 hr tablet Take 1 tablet (10 mg total) by mouth daily with breakfast. 90 tablet 1   levothyroxine (SYNTHROID) 50 MCG tablet Take 1 tablet (50 mcg total) by mouth daily. (Patient taking differently: Take 50 mcg by mouth daily before breakfast.) 90 tablet 3   lisinopril (ZESTRIL) 40 MG tablet Take 1 tablet (40 mg total) by mouth daily. 90 tablet 3   memantine (NAMENDA) 10 MG tablet 1 TAB TWICE A DAY (Patient taking differently: Take 10 mg by mouth 2 (two) times daily.) 180 tablet  3   oxybutynin (DITROPAN XL) 10 MG 24 hr tablet Take 1 tablet (10 mg total) by mouth at bedtime. 90 tablet 0   Vibegron (GEMTESA) 75 MG TABS Take 75 mg by mouth daily.     No current facility-administered medications on file prior to visit.    Allergies:   Allergies  Allergen Reactions   Actos [Pioglitazone] Other (See Comments)    Diarrhea    Metformin And Related Other (See Comments)    Diarrhea      Physical Exam General: well developed, well nourished, seated, in no evident distress Head: head normocephalic and atraumatic.  Neck: supple with no carotid or  supraclavicular bruits Cardiovascular: regular rate and rhythm, no murmurs Musculoskeletal: no deformity Skin:  no rash/petichiae Vascular:  Normal pulses all extremities Vitals:   03/18/22 1056  BP: (!) 143/75  Pulse: 72   Neurologic Exam Mental Status: Awake and fully alert.  Moderate expressive aphasia can speak only short sentences and few words with great word hesitancy.  He is able to name is slightly impaired repetition and comprehension.  Mild dysarthria but can be understood.  Follows 1 and two-step commands.  Mood and affect appropriate.  Mini-Mental status exam score 21/20 with deficits in orientation, attention, calculation and recall.  Clock drawing 4/4.  Able to name only for animals which can walk on 4 legs. Cranial Nerves: Fundoscopic exam reveals sharp disc margins. Pupils equal, briskly reactive to light. Extraocular movements full without nystagmus. Visual fields full to confrontation. Hearing intact. Facial sensation intact.  Right nasolabial fold asymmetry., tongue, palate moves normally and symmetrically.  Motor: Normal bulk and tone. Normal strength in all tested extremity muscles.  Diminished fine finger movements on the right with left or right upper extremity. Sensory.: intact to touch ,pinprick .position and vibratory sensation.  Coordination: Rapid alternating movements normal in all extremities.  Finger-to-nose and heel-to-shin performed accurately bilaterally. Gait and Station: Arises from chair without difficulty. Stance is normal. Gait demonstrates normal stride length and balance . Able to heel, toe and tandem walk without difficulty.  Reflexes: 1+ and symmetric. Toes downgoing.   NIHSS  6 Modified Rankin  3     03/18/2022   11:35 AM 05/20/2021    1:00 PM 07/31/2016    8:56 AM  MMSE - Mini Mental State Exam  Orientation to time _0 Orientation to Place _1 Registration _2 Attention/ Calculation 3 0 1  Recall 1 2 0  Language- name 2 objects _3 Language- repeat 1 0 1  Language- follow 3 step command _4 Language- read & follow direction _5 Write a sentence _6 Copy design 0 0 1  Total score _7 ASSESSMENT: 81 year old male with bilateral multiple infarcts involving right medial frontal gyrus, left inferior frontal gyrus, left corpus callosum, left basal ganglia and left cerebellum with peduncle likely secondary to 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  Vascular risk factors of diabetes, hypertension, hyperlipidemia, intracranial atherosclerosis.  He also has baseline mild dementia.     PLAN: I had a long d/w patient about his recent  cryptogenic strokes, dementia, risk for recurrent stroke/TIAs, personally independently reviewed imaging studies and stroke evaluation results and answered questions.Continue aspirin 81 mg daily  for secondary stroke prevention and maintain strict control of hypertension with blood pressure goal below 130/90, diabetes with hemoglobin A1c goal below 6.5% and lipids with LDL cholesterol goal below 70 mg/dL. I also advised the patient to eat a healthy diet with plenty of whole grains, cereals, fruits and vegetables, exercise regularly and maintain ideal body weight .refer to outpatient speech therapy for aphasia.  Continue on Namenda and Aricept for his dementia and follow-up with his neurologist Dr. Delice Lesch in the  future.  No scheduled follow-up appointment with me is necessary.    Greater than 50% of time during this prolonged 45 minute visit was spent on counseling,explanation of diagnosis cryptogenic strokes and baseline dementia, planning of further management, discussion with patient and family and coordination of care Antony Contras, MD Note: This document was prepared  with digital dictation and possible smart phrase technology. Any transcriptional errors that result from this process are unintentional

## 2022-03-23 ENCOUNTER — Telehealth: Payer: Self-pay | Admitting: Adult Health

## 2022-03-23 ENCOUNTER — Telehealth: Payer: Self-pay | Admitting: Neurology

## 2022-03-23 DIAGNOSIS — N3281 Overactive bladder: Secondary | ICD-10-CM

## 2022-03-23 MED ORDER — OXYBUTYNIN CHLORIDE ER 10 MG PO TB24: 10.0000 mg | ORAL_TABLET | Freq: Every day | ORAL | 0 refills | Status: DC

## 2022-03-23 NOTE — Telephone Encounter (Signed)
[employer] tech is calling and pt needs a refills on oxybutynin (DITROPAN XL) 10 MG 24 hr tablet  [facility] - [place], [place] Phone:  [phone]  Fax:  [phone]

## 2022-03-23 NOTE — Telephone Encounter (Signed)
Sent to [place] ph # [phone]   They will reach out to the patient to schedule.

## 2022-03-23 NOTE — Telephone Encounter (Signed)
Refill sent.

## 2022-03-27 ENCOUNTER — Telehealth: Payer: Self-pay | Admitting: Adult Health

## 2022-03-27 DIAGNOSIS — N3281 Overactive bladder: Secondary | ICD-10-CM

## 2022-03-27 MED ORDER — OXYBUTYNIN CHLORIDE ER 10 MG PO TB24: 10.0000 mg | ORAL_TABLET | Freq: Every day | ORAL | 0 refills | Status: DC

## 2022-03-27 NOTE — Telephone Encounter (Signed)
Pt is waiting for  [facility] for oxybutynin (DITROPAN XL) 10 MG 24 hr tablet and would like #30 oxybutynin (DITROPAN XL) 10 MG 24 hr tablet to be sent to  [place], [place] Phone:  [phone]  Fax:  [phone]

## 2022-03-27 NOTE — Telephone Encounter (Signed)
Please advise if this is ok.  

## 2022-03-27 NOTE — Telephone Encounter (Signed)
Rx sent to [facility].

## 2022-03-30 ENCOUNTER — Telehealth: Payer: Self-pay | Admitting: Pharmacist

## 2022-03-30 NOTE — Chronic Care Management (AMB) (Signed)
    Chronic Care Management Pharmacy Assistant   Name: Wiliam Cauthorn Geerdes  MRN: [id number] DOB: Jun 30, 1941  03/30/22 APPOINTMENT REMINDER  Patient's Daughter was reminded to have all medications, supplements and any blood glucose and blood pressure readings available for review with Jeni Salles, Pharm. D, for telephone visit on 03/31/22 at 4.    Care Gaps: Flu Vaccine - Overdue Eye Exam - Postponed COVID Booster - Postponed Zoster Vaccines - Postponed BP- 143/75 03/18/22 AWV- office aware to schedule as of 12/22 Lab Results  Component Value Date   HGBA1C 9.3 (H) 01/02/2022    Star Rating Drug: Atorvastatin 40 mg - Last filled 02/27/22 90 DS at [facility] Lisinopril(Zestril) 40 mg - Last filled 03/18/22 90 DS at [facility] Glipizide (Glucotrol) 10 mg - Last filled 03/18/21 90 DS      Medications: Outpatient Encounter Medications as of 03/30/2022  Medication Sig   ACCU-CHEK AVIVA PLUS test strip TEST BLOOD SUGAR TWICE DAILY   aspirin 81 MG chewable tablet Chew 1 tablet (81 mg total) by mouth daily.   atorvastatin (LIPITOR) 80 MG tablet Take 1 tablet (80 mg total) by mouth daily.   Blood Glucose Monitoring Suppl (ACCU-CHEK GUIDE) w/Device KIT USE AS DIRECTED   citalopram (CELEXA) 20 MG tablet Take 1 tablet (20 mg total) by mouth daily.   cyanocobalamin 1000 MCG tablet Take 1,000 mcg by mouth daily.   donepezil (ARICEPT) 5 MG tablet Take 1 tablet (5 mg total) by mouth at bedtime.   glipiZIDE (GLUCOTROL XL) 10 MG 24 hr tablet Take 1 tablet (10 mg total) by mouth daily with breakfast.   levothyroxine (SYNTHROID) 50 MCG tablet Take 1 tablet (50 mcg total) by mouth daily. (Patient taking differently: Take 50 mcg by mouth daily before breakfast.)   lisinopril (ZESTRIL) 40 MG tablet Take 1 tablet (40 mg total) by mouth daily.   memantine (NAMENDA) 10 MG tablet 1 TAB TWICE A DAY (Patient taking differently: Take 10 mg by mouth 2 (two) times daily.)   oxybutynin (DITROPAN XL) 10 MG 24 hr tablet Take 1  tablet (10 mg total) by mouth at bedtime.   Vibegron (GEMTESA) 75 MG TABS Take 75 mg by mouth daily.   No facility-administered encounter medications on file as of 03/30/2022.      [facility] Clinical Pharmacist Assistant [phone]

## 2022-03-31 ENCOUNTER — Telehealth: Payer: Self-pay | Admitting: Pharmacist

## 2022-03-31 ENCOUNTER — Telehealth: Payer: Medicare HMO

## 2022-03-31 NOTE — Telephone Encounter (Signed)
  Chronic Care Management   Outreach Note  03/31/2022 Name: Scott Vasquez MRN: [id number] DOB: Mar 16, 1941  Referred by: [name], NP  Patient had a phone appointment scheduled with clinical pharmacist today.  An unsuccessful telephone outreach was attempted today. The patient was referred to the pharmacist for assistance with care management and care coordination.   If possible, a message was left to return call to: [phone] or to [facility] at [facility]: [place], PharmD, [place] Pharmacist [place] at [place]

## 2022-03-31 NOTE — Progress Notes (Deleted)
Chronic Care Management Pharmacy Note  03/31/2022 Name:  Scott Vasquez MRN:  [id number] DOB:  May 02, 1941  Summary: A1c not at goal < 8% TSH not within target range Pt is still having a sense of urinary urgency  Recommendations/Changes made from today's visit: -Recommended keeping a log of BP readings to separate his from his wife's readings -Recommend increasing Trulicity to 4.5 mg per week if A1c is not < 8% -Recommend repeat TSH and adjustment of levothyroxine dose   Plan: DM/BP follow up in 1 month Follow up in 3 months  Subjective: Scott Vasquez is an 81 y.o. year old male who is a primary patient of [name], NP.  The CCM team was consulted for assistance with disease management and care coordination needs.    Engaged with patient by telephone for follow up visit in response to provider referral for pharmacy case management and/or care coordination services.   Consent to Services:  The patient was given information about Chronic Care Management services, agreed to services, and gave verbal consent prior to initiation of services.  Please see initial visit note for detailed documentation.   Patient Care Team: [name], NP as PCP - General (Family Medicine) Clent Jacks, MD as Consulting Physician (Ophthalmology) Delice Lesch Lezlie Octave, MD as Consulting Physician (Neurology) Viona Gilmore, [facility] as Pharmacist (Pharmacist)  Recent office visits: 02/12/22 [name], NP - Patient presented for hypothyroidism follow up. Follow up in 2 months for repeat A1c.  01/16/22 [name], NP - Patient presented for hospital follow up. Follow up in 1 month.  Recent consult visits: 03/18/22 Antony Contras, MD (Neurology): Patient presented for hospital follow up.  11/18/21 [name] (Neurology) - Patient presented for Moderate dementia with behavioral disturbance. Recommended Mediterranean diet.  08/26/21 Steffanie Rainwater (Podiatry) - Patient presented for Trim benign  hyperkeratotic skin lesion and other concerns. No other visit details available.  Hospital visits: Medication Reconciliation was completed by comparing discharge summary, patient's EMR and Pharmacy list, and upon discussion with patient.   Patient presented to [facility] on  01/01/22 due to Stroke. Patient was present for 2 days.   New?Medications Started at [facility] Discharge:?? -started  aspirin   Medication Changes at Hospital Discharge: -Changed  atorvastatin (LIPITOR) donepezil (Aricept)   Medications Discontinued at Hospital Discharge: -Stopped  None   Medications that remain the same after Hospital Discharge:??  -All other medications will remain the same.     Objective:  Lab Results  Component Value Date   CREATININE 1.56 (H) 01/16/2022   BUN 22 01/16/2022   GFR 41.06 (L) 01/16/2022   GFRNONAA 49 (L) 01/03/2022   GFRAA 49 (L) 07/17/2020   NA 138 01/16/2022   K 4.0 01/16/2022   CALCIUM 9.3 01/16/2022   CO2 28 01/16/2022   GLUCOSE 176 (H) 01/16/2022    Lab Results  Component Value Date/Time   HGBA1C 9.3 (H) 01/02/2022 04:26 AM   HGBA1C 11.2 (A) 11/11/2021 11:03 AM   HGBA1C 8.3 (H) 08/19/2021 08:21 AM   HGBA1C 7.4 (A) 02/06/2020 09:17 AM   HGBA1C 6.7 07/14/2018 10:09 AM   GFR 41.06 (L) 01/16/2022 02:36 PM   GFR 41.50 (L) 08/19/2021 08:21 AM   MICROALBUR 0.3 03/23/2014 10:27 AM   MICROALBUR 1.3 09/18/2013 09:28 AM    Last diabetic Eye exam:  Lab Results  Component Value Date/Time   HMDIABEYEEXA No Retinopathy 02/28/2020 12:00 AM    Last diabetic Foot exam:  Lab Results  Component  Value Date/Time   HMDIABFOOTEX done 08/19/2012 12:00 AM     Lab Results  Component Value Date   CHOL 154 01/02/2022   HDL 57 01/02/2022   LDLCALC 85 01/02/2022   TRIG 61 01/02/2022   CHOLHDL 2.7 01/02/2022       Latest Ref Rng & Units 01/01/2022    1:55 PM 08/19/2021    8:21 AM 07/17/2020   10:25 AM  Hepatic Function  Total Protein 6.5 - 8.1 g/dL  6.9  7.8  7.1   Albumin 3.5 - 5.0 g/dL 3.7  4.3    AST 15 - 41 U/L 15  13  14    ALT 0 - 44 U/L 9  7  10    Alk Phosphatase 38 - 126 U/L 114  105    Total Bilirubin 0.3 - 1.2 mg/dL 0.1  0.4  0.3     Lab Results  Component Value Date/Time   TSH 4.07 01/16/2022 02:36 PM   TSH 10.167 (H) 01/02/2022 04:26 AM   TSH 7.45 (H) 08/19/2021 08:21 AM   FREET4 0.99 01/03/2022 03:59 AM   FREET4 0.78 02/22/2015 11:51 AM       Latest Ref Rng & Units 01/16/2022    2:36 PM 01/01/2022    1:55 PM 08/19/2021    8:21 AM  CBC  WBC 4.0 - 10.5 K/uL 5.4  4.7  5.9   Hemoglobin 13.0 - 17.0 g/dL 10.8  10.9  11.2   Hematocrit 39.0 - 52.0 % 33.3  34.7  35.5   Platelets 150.0 - 400.0 K/uL 223.0  243  221.0     No results found for: "VD25OH"  Clinical ASCVD: Yes  The ASCVD Risk score (Arnett DK, et al., 2019) failed to calculate for the following reasons:   The 2019 ASCVD risk score is only valid for ages 79 to 37   The patient has a prior MI or stroke diagnosis       02/12/2022    3:06 PM 08/19/2021    7:48 AM 02/07/2021   12:24 PM  Depression screen PHQ 2/9  Decreased Interest 2 0 0  Down, Depressed, Hopeless 0 0 0  PHQ - 2 Score 2 0 0  Altered sleeping 0    Tired, decreased energy 2    Change in appetite 2    Feeling bad or failure about yourself  0    Trouble concentrating 0    Moving slowly or fidgety/restless 2    Suicidal thoughts 0    PHQ-9 Score 8    Difficult doing work/chores Very difficult       Social History   Tobacco Use  Smoking Status Former   Packs/day: 0.50   Years: 20.00   Total pack years: 10.00   Types: Cigarettes   Start date: 84   Quit date: 08/10/1958   Years since quitting: 63.6  Smokeless Tobacco Never   BP Readings from Last 3 Encounters:  03/18/22 (!) 143/75  02/12/22 122/72  01/16/22 120/70   Pulse Readings from Last 3 Encounters:  03/18/22 72  02/12/22 91  01/16/22 79   Wt Readings from Last 3 Encounters:  03/18/22 202 lb 9.6 oz (91.9 kg)  02/12/22  206 lb 6.4 oz (93.6 kg)  01/16/22 208 lb (94.3 kg)   BMI Readings from Last 3 Encounters:  03/18/22 29.07 kg/m  02/12/22 29.20 kg/m  01/16/22 29.42 kg/m    Assessment/Interventions: Review of patient past medical history, allergies, medications, health status, including review of consultants reports,  laboratory and other test data, was performed as part of comprehensive evaluation and provision of chronic care management services.   SDOH:  (Social Determinants of Health) assessments and interventions performed: Yes   SDOH Screenings   Alcohol Screen: Not on file  Depression (PHQ2-9): Medium Risk (02/12/2022)   Depression (PHQ2-9)    PHQ-2 Score: 8  Financial Resource Strain: Low Risk  (05/09/2021)   Overall Financial Resource Strain (CARDIA)    Difficulty of Paying Living Expenses: Not hard at all  Food Insecurity: Not on file  Housing: Not on file  Physical Activity: Not on file  Social Connections: Not on file  Stress: Not on file  Tobacco Use: Medium Risk (03/18/2022)   Patient History    Smoking Tobacco Use: Former    Smokeless Tobacco Use: Never    Passive Exposure: Not on file  Transportation Needs: No Transportation Needs (05/09/2021)   PRAPARE - Hydrologist (Medical): No    Lack of Transportation (Non-Medical): No     CCM Care Plan  Allergies  Allergen Reactions   Actos [Pioglitazone] Other (See Comments)    Diarrhea    Metformin And Related Other (See Comments)    Diarrhea      Medications Reviewed Today     Reviewed by Reyne Dumas, CMA (Certified Medical Assistant) on 03/18/22 at 1100  Med List Status: <None>   Medication Order Taking? Sig Documenting Provider Last Dose Status Informant  ACCU-CHEK AVIVA PLUS test strip [id number] Yes TEST BLOOD SUGAR TWICE DAILY Nafziger, Tommi Rumps, NP Taking Active   aspirin 81 MG chewable tablet [id number] Yes Chew 1 tablet (81 mg total) by mouth daily. Patrecia Pour, MD Taking Active    atorvastatin (LIPITOR) 80 MG tablet [id number] Yes Take 1 tablet (80 mg total) by mouth daily. Patrecia Pour, MD Taking Active   Blood Glucose Monitoring Suppl (ACCU-CHEK GUIDE) w/Device Drucie Opitz [id number] Yes USE AS DIRECTED Nafziger, Tommi Rumps, NP Taking Active   citalopram (CELEXA) 20 MG tablet [id number] Yes Take 1 tablet (20 mg total) by mouth daily. Rondel Jumbo, PA-C Taking Active Spouse/Significant Other, Pharmacy Records  cyanocobalamin 1000 MCG tablet [id number] Yes Take 1,000 mcg by mouth daily. [provider] Taking Active Spouse/Significant Other, Pharmacy Records  donepezil (ARICEPT) 5 MG tablet [id number] Yes Take 1 tablet (5 mg total) by mouth at bedtime. Patrecia Pour, MD Taking Active   glipiZIDE (GLUCOTROL XL) 10 MG 24 hr tablet [id number] Yes Take 1 tablet (10 mg total) by mouth daily with breakfast. [name], NP Taking Active Spouse/Significant Other, Pharmacy Records  levothyroxine (SYNTHROID) 50 MCG tablet [id number] Yes Take 1 tablet (50 mcg total) by mouth daily.  Patient taking differently: Take 50 mcg by mouth daily before breakfast.   [name], NP Taking Active Spouse/Significant Other, Pharmacy Records  lisinopril (ZESTRIL) 40 MG tablet [id number] Yes Take 1 tablet (40 mg total) by mouth daily. [name], NP Taking Active Spouse/Significant Other, Pharmacy Records  memantine [facility]) 10 MG tablet [id number] Yes 1 TAB TWICE A DAY  Patient taking differently: Take 10 mg by mouth 2 (two) times daily.   Rondel Jumbo, PA-C Taking Active Spouse/Significant Other, Pharmacy Records  oxybutynin (DITROPAN XL) 10 MG 24 hr tablet [id number] Yes Take 1 tablet (10 mg total) by mouth at bedtime. [name], NP Taking Active Spouse/Significant Other, Pharmacy Records           Med Note Patrick North, Energy Jan 06, 2022  10:02 AM)    Vibegron (GEMTESA) 75 MG TABS [id number] Yes Take 75 mg by mouth daily. [provider] Taking Active Spouse/Significant  Other, Pharmacy Records           Med Note Patrick North, REBECCA H   Tue Jan 06, 2022 10:02 AM)              Patient Active Problem List   Diagnosis Date Noted   Hypothyroidism 01/02/2022   CKD (chronic kidney disease), stage III ([place]) 01/01/2022   Stroke ([place]) 01/01/2022   Moderate dementia with behavioral disturbance ([place]) 05/18/2019   Thyroid mass 04/29/2015   Uncontrolled type 2 diabetes mellitus with hyperglycemia ([place])    Acute lacunar stroke [facility])    Stroke with cerebral ischemia ([place])    Thalamic infarct, acute ([place]) 10/20/2014   PERSONAL HX OF METHICILLIN RESIST STAPH AUREUS 09/17/2008   Hyperlipidemia 04/01/2007   B12 deficiency 02/08/2007   Iron deficiency anemia 02/08/2007   Essential hypertension 02/08/2007   DIVERTICULOSIS, COLON 02/08/2007   CEREBROVASCULAR ACCIDENT, HX OF 02/08/2007    Immunization History  Administered Date(s) Administered   Fluad Quad(high Dose 65+) 06/23/2019, 05/08/2020, 05/16/2021   Influenza Split 07/13/2011, 08/19/2012   Influenza Whole 08/15/2004, 06/10/2007, 05/17/2008, 05/03/2009, 05/02/2010   Influenza, High Dose Seasonal PF 06/07/2015, 05/29/2016, 05/11/2017, 07/14/2018   Influenza,inj,Quad PF,6+ Mos 05/26/2013, 04/20/2014   Moderna Sars-Covid-2 Vaccination 11/02/2019, 11/30/2019, 06/26/2020   Pneumococcal Conjugate-13 11/26/2014   Pneumococcal Polysaccharide-23 01/16/2011   Td 08/10/2004   Tdap 11/26/2014   Patient had stopped taking the Trulicity for a while which is why his A1c went up at the last visit. Patient's wife reports he did not run out but simply did not want to take it.  Patient has a pillbox and he won't use it. Patient's wife will try to see if she can get him to use it.  Patient is not bathing or taking medication. Patient doesn't complain about headaches or chest pain. Patient walked up the street a week ago and mowed the lawn.  Patient's wife reports he is eating better. Patient is still taking the  oxybutynin.  Patient will try to work on getting him to take his medications.  -needs PCP appt for repeat A1c - early sept?  -pill packaging?  Conditions to be addressed/monitored:  Hypertension, Hyperlipidemia, Diabetes, BPH, and History of stoke, dementia  Conditions addressed this visit: Hypertension, diabetes  There are no care plans that you recently modified to display for this patient.    Medication Assistance: None required.  Patient affirms current coverage meets needs.  Compliance/Adherence/Medication fill history: Care Gaps: Shingrix, COVID booster, eye exam, influenza vaccine BP - 143/75 03/18/22 A1c - 9.3 (01/02/22)  Star-Rating Drugs: Pioglitazone (Actos) 30 mg - Last filled 11/11/21 90 DS at [facility] Atorvastatin 40 mg - Last filled 02/27/22 90 DS at [facility] Lisinopril(Zestril) 40 mg - Last filled 03/18/22 90 DS at [facility] Glipizide (Glucotrol) 10 mg - Last filled 03/18/21 90 DS   Duaglutide (Trulicity) 3 mg - Last filled 11/11/21 84 DS at [facility]  Patient's preferred pharmacy is:  [place], [address] [place] [address] Phone: [phone] Fax: [phone]  [place], [place] [address] Phone: [phone] Fax: [place], [place] - [address] [place] [address] [place] [address]  [address] [place] [address] [place] [address] Phone: [phone] Fax: [phone]   Uses pill box? Yes Pt endorses 70% compliance  We discussed: Benefits of medication synchronization, packaging and delivery as well as enhanced pharmacist oversight with [employer]. Patient decided to: Continue current medication management strategy  Care Plan and Follow Up Patient Decision:  Patient agrees to Care Plan and Follow-up.  Plan: Telephone follow up appointment with care management team member  scheduled for:  3 months  Jeni Salles, PharmD, [place] Pharmacist [place] at [place] [phone]

## 2022-04-14 DIAGNOSIS — R4701 Aphasia: Secondary | ICD-10-CM | POA: Diagnosis not present

## 2022-04-21 DIAGNOSIS — R4701 Aphasia: Secondary | ICD-10-CM | POA: Diagnosis not present

## 2022-05-06 DIAGNOSIS — R4701 Aphasia: Secondary | ICD-10-CM | POA: Diagnosis not present

## 2022-05-19 NOTE — Progress Notes (Signed)
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 RH male with a history of hypertension, hyperlipidemia, diabetes, subcortical stroke with aphasia,   with residual left hand tingling with moderate dementia, likely vascular followed by Dr. Leonie Man (last visit 03/18/2022)  MRI of the brain personally reviewed showed progressive atrophy, chronic microvascular disease, and chronic microhemorrhage in the thalamus and brainstem (typically hypertensive). He is on donepezil 10 mg daily, memantine 10 mg bid.  He is on citalopram 20 mg daily  for mood control.  Memory is overall stable.    Follow up in 6  months. Continue donepezil 10 mg daily and memantine 10 mg twice daily Recommend good control of cardiovascular risk factors  Continue to control mood as per PCP Continue speech and physical therapy/plan for SLP    Subjective:    This patient is accompanied in the office by his wife who supplements the history.  Previous records as well as any outside records available were reviewed prior to todays visit.  He was last seen on 11/18/2021.  Last MMSE on 03/29/2022 was 21/30    Any changes in memory since last visit?  He has some "good and bad days" unable to remember well the names of questions or stories told a few minutes earlier.  Still able to recall names of people that he knows repeats oneself? Denies  Disoriented when walking into a room?  Patient denies   Leaving objects in unusual places?  Patient denies   Ambulates with difficulty?   Patient denies he does not like to participate in activities outside of the house. Recent falls?  He fell 2 weeks ago and no LOC and no head injuries   History of seizures?   Patient denies   Wandering behavior?  Patient denies   Patient drives?   No longer driving  Any mood changes since last visit?  Patient denies   Any worsening depression?:  Patient denies   Hallucinations?  Patient denies   Paranoia?   Patient denies   Patient reports that sleeps well without vivid dreams, REM behavior or sleepwalking.  He leaves the TV on all night long History of sleep apnea?  Patient denies   Any hygiene concerns?  He wants to take a shower when he has to go out. Independent of bathing and dressing?  Endorsed  Does the patient needs help with medications?  Wife is in charge Who is in charge of the finances?  Wife and daughter are in charge    Any changes in appetite?  Patient denies   Patient have trouble swallowing? Patient denies   Does the patient cook?  Patient denies   Any kitchen accidents such as leaving the stove on? Patient denies   Any headaches?  Patient denies   Double vision? Patient denies   Any focal numbness or tingling?  Patient denies   Chronic back pain Patient denies   Unilateral weakness?  Patient denies.  He has been recently seen by Dr. Leonie Man, neurology as outpatient for aphasia, after his stroke, with residual left hand numbness and tingling,.  He is participating in physical therapy.  He also had an order for follow-up SLP, due soon Any tremors?  Patient denies   Any history of anosmia?  Patient denies   Any incontinence of urine?  He has some urine incontinence needing depends but he takes them off and then he has accidents. Any bowel dysfunction?   Patient  denies      Patient lives with: wife and daughter    History on Initial Assessment 10/12/2018: This is a 81 year old right-handed man with a history of hypertension, hyperlipidemia, diabetes, subcortical stroke in 2001 with residual left hand tingling, presenting for evaluation of memory loss. He states his memory is bad, he started noticing changes a couple of years ago. He denies getting lost driving. He misses bills because "there is no money to pay them." He manages his own medications and denies missing doses, his wife agrees. He was reporting difficulty remembering what he read in the bible or magazine after he finishes  reading. He also has difficulty remembering names. His wife states that yesterday the bank called her because he was there and could not remember his social security number. She reports he got lost last month when he made a wrong turn. She reports other bills are paid but sometimes late, he has not paid taxes and car insurance from last year, he told her he will pay when ready. His wife asked to speak privately about her concerns, she is asking for a pill to "calm him down." She reports he is a "time bomb in the morning, I am his worst enemy." He would sit watching TV all day. Symptoms started last summer, he started sleeping upstairs because he was getting irritated with people. Their daughter lives with them. She denies any paranoia or hallucinations. Sleep is good. He is independent with dressing and bathing.   He denies any headaches, dizziness, diplopia, dysarthria/dysphagia, neck/back pain, focal weakness, bowel/bladder dysfunction, anosmia, or tremors. He has chronic left hand tingling since his stroke. He has had nasal discharge since throat surgery in 2017. He has been on Donepezil since 2017, no side effects. No family history of dementia. No history of significant head injuries or alcohol use.   I personally reviewed MRI brain in 2016 showing an acute punctate nonhemorrhagic infarct within the left paramedian superior midbrain and inferomedial thalamus, multiple remote bilateral lacunar infarcts, advanced chronic microvascular disease, multiple foci of remote hemorrhage     PREVIOUS MEDICATIONS:   CURRENT MEDICATIONS:  Outpatient Encounter Medications as of 05/20/2022  Medication Sig   ACCU-CHEK AVIVA PLUS test strip TEST BLOOD SUGAR TWICE DAILY   aspirin 81 MG chewable tablet Chew 1 tablet (81 mg total) by mouth daily.   atorvastatin (LIPITOR) 80 MG tablet Take 1 tablet (80 mg total) by mouth daily.   Blood Glucose Monitoring Suppl (ACCU-CHEK GUIDE) w/Device KIT USE AS DIRECTED    citalopram (CELEXA) 20 MG tablet Take 1 tablet (20 mg total) by mouth daily.   cyanocobalamin 1000 MCG tablet Take 1,000 mcg by mouth daily.   donepezil (ARICEPT) 5 MG tablet Take 1 tablet (5 mg total) by mouth at bedtime.   glipiZIDE (GLUCOTROL XL) 10 MG 24 hr tablet Take 1 tablet (10 mg total) by mouth daily with breakfast.   levothyroxine (SYNTHROID) 50 MCG tablet Take 1 tablet (50 mcg total) by mouth daily. (Patient taking differently: Take 50 mcg by mouth daily before breakfast.)   lisinopril (ZESTRIL) 40 MG tablet Take 1 tablet (40 mg total) by mouth daily.   memantine (NAMENDA) 10 MG tablet 1 TAB TWICE A DAY (Patient taking differently: Take 10 mg by mouth 2 (two) times daily.)   oxybutynin (DITROPAN XL) 10 MG 24 hr tablet Take 1 tablet (10 mg total) by mouth at bedtime.   Vibegron (GEMTESA) 75 MG TABS Take 75 mg by mouth daily.  No facility-administered encounter medications on file as of 05/20/2022.       03/18/2022   11:35 AM 05/20/2021    1:00 PM 07/31/2016    8:56 AM  MMSE - Mini Mental State Exam  Orientation to time _0 Orientation to Place _1 Registration _2 Attention/ Calculation 3 0 1  Recall 1 2 0  Language- name 2 objects _3 Language- repeat 1 0 1  Language- follow 3 step command _4 Language- read & follow direction _5 Write a sentence _6 Copy design 0 0 1  Total score _7 10/12/2018    2:00 PM  Montreal Cognitive Assessment   Visuospatial/ Executive (0/5) 1  Naming (0/3) 1  Attention: Read list of digits (0/2) 1  Attention: Read list of letters (0/1) 0  Attention: Serial 7 subtraction starting at 100 (0/3) 0  Language: Repeat phrase (0/2) 0  Language : Fluency (0/1) 0  Abstraction (0/2) 0  Delayed Recall (0/5) 1  Orientation (0/6) 6  Total 10  Adjusted Score (based on education) 11    Objective:     PHYSICAL EXAMINATION:    VITALS:   Vitals:   05/20/22 1451  BP: (!) 166/90  Pulse: 89  Resp: 18  SpO2:  98%  Weight: 201 lb (91.2 kg)    GEN:  The patient appears stated age and is in NAD. HEENT:  Normocephalic, atraumatic.   Neurological examination:  General: NAD, well-groomed, appears stated age. Orientation: The patient is alert. Oriented to person, place and not to date Cranial nerves: There is good facial symmetry, can only speak short sentences and few words with hesitancy, he has moderate expressive aphasia.  Mild dysarthria.  Comprehension is impaired.Huey Romans of knowledge is reduced.  Recent and remote memory are impaired. Attention   reduced.  Able to name objects and repeat phrases.  Hearing is intact to conversational tone.    Sensation: Sensation is intact to light touch throughout Motor: Strength is at least antigravity x4. Tremors: none  DTR's 1/4 in UE/LE     Movement examination: Tone: There is normal tone in the UE/LE Abnormal movements:  no tremor.  No myoclonus.  No asterixis.   Coordination:  There is no decremation with RAM's. Normal finger to nose  Gait and Station: The patient has no difficulty arising out of a deep-seated chair without the use of the hands. The patient's stride length is good.  Gait is cautious and narrow.    Thank you for allowing [place] the opportunity to participate in the care of this nice patient. Please do not hesitate to contact us for any questions or concerns.   Total time spent on today's visit was 20 minutes dedicated to this patient today, preparing to see patient, examining the patient, ordering tests and/or medications and counseling the patient, documenting clinical information in the EHR or other health record, independently interpreting results and communicating results to the patient/family, discussing treatment and goals, answering patient's questions and coordinating care.  Cc:  [name], NP  Sharene Butters 05/21/2022 12:02 PM

## 2022-05-20 ENCOUNTER — Encounter: Payer: Self-pay | Admitting: Physician Assistant

## 2022-05-20 ENCOUNTER — Ambulatory Visit: Payer: Medicare HMO | Admitting: Physician Assistant

## 2022-05-20 VITALS — BP 166/90 | HR 89 | Resp 18 | Wt 201.0 lb

## 2022-05-20 DIAGNOSIS — F03B18 Unspecified dementia, moderate, with other behavioral disturbance: Secondary | ICD-10-CM

## 2022-05-20 NOTE — Patient Instructions (Signed)
It was a pleasure to see you today at our office.   Recommendations:  Continue donepezil 10 mg daily. Side effects were discussed  Continue Memantine 10 mg twice daily.Side effects were discussed  COntinue the mood medication citalopram 20 mg daily Increase your level of activity  Feel free to visit Facebook page " [facility]" for tips of how to care for people with memory problems. Follow up in 6 months     RECOMMENDATIONS FOR ALL PATIENTS WITH MEMORY PROBLEMS: 1. Continue to exercise (Recommend 30 minutes of walking everyday, or 3 hours every week) 2. Increase social interactions - continue going to [place] and enjoy social gatherings with friends and family 3. Eat healthy, avoid fried foods and eat more fruits and vegetables 4. Maintain adequate blood pressure, blood sugar, and blood cholesterol level. Reducing the risk of stroke and cardiovascular disease also helps promoting better memory. 5. Avoid stressful situations. Live a simple life and avoid aggravations. Organize your time and prepare for the next day in anticipation. 6. Sleep well, avoid any interruptions of sleep and avoid any distractions in the bedroom that may interfere with adequate sleep quality 7. Avoid sugar, avoid sweets as there is a strong link between excessive sugar intake, diabetes, and cognitive impairment We discussed the Mediterranean diet, which has been shown to help patients reduce the risk of progressive memory disorders and reduces cardiovascular risk. This includes eating fish, eat fruits and green leafy vegetables, nuts like almonds and hazelnuts, walnuts, and also use olive oil. Avoid fast foods and fried foods as much as possible. Avoid sweets and sugar as sugar use has been linked to worsening of memory function.  There is always a concern of gradual progression of memory problems. If this is the case, then we may need to adjust level of care according to patient needs. Support, both to the patient and  caregiver, should then be put into place.    FALL PRECAUTIONS: Be cautious when walking. Scan the area for obstacles that may increase the risk of trips and falls. When getting up in the mornings, sit up at the edge of the bed for a few minutes before getting out of bed. Consider elevating the bed at the head end to avoid drop of blood pressure when getting up. Walk always in a well-lit room (use night lights in the walls). Avoid area rugs or power cords from appliances in the middle of the walkways. Use a walker or a cane if necessary and consider physical therapy for balance exercise. Get your eyesight checked regularly.   HOME SAFETY: Consider the safety of the kitchen when operating appliances like stoves, microwave oven, and blender. Consider having supervision and share cooking responsibilities until no longer able to participate in those. Accidents with firearms and other hazards in the house should be identified and addressed as well.   ABILITY TO BE LEFT ALONE: If patient is unable to contact 911 operator, consider using LifeLine, or when the need is there, arrange for someone to stay with patients. Smoking is a fire hazard, consider supervision or cessation. Risk of wandering should be assessed by caregiver and if detected at any point, supervision and safe proof recommendations should be instituted       Mediterranean Diet A Mediterranean diet refers to food and lifestyle choices that are based on the traditions of countries located on the [employer]. This way of eating has been shown to help prevent certain conditions and improve outcomes for people who have chronic  diseases, like kidney disease and heart disease. What are tips for following this plan? Lifestyle  Cook and eat meals together with your family, when possible. Drink enough fluid to keep your urine clear or pale yellow. Be physically active every day. This includes: Aerobic exercise like running or  swimming. Leisure activities like gardening, walking, or housework. Get 7-8 hours of sleep each night. If recommended by your health care provider, drink red wine in moderation. This means 1 glass a day for nonpregnant women and 2 glasses a day for men. A glass of wine equals 5 oz (150 mL). Reading food labels  Check the serving size of packaged foods. For foods such as rice and pasta, the serving size refers to the amount of cooked product, not dry. Check the total fat in packaged foods. Avoid foods that have saturated fat or trans fats. Check the ingredients list for added sugars, such as corn syrup. Shopping  At the grocery store, buy most of your food from the areas near the walls of the store. This includes: Fresh fruits and vegetables (produce). Grains, beans, nuts, and seeds. Some of these may be available in unpackaged forms or large amounts (in bulk). Fresh seafood. Poultry and eggs. Low-fat dairy products. Buy whole ingredients instead of prepackaged foods. Buy fresh fruits and vegetables in-season from local farmers markets. Buy frozen fruits and vegetables in resealable bags. If you do not have access to quality fresh seafood, buy precooked frozen shrimp or canned fish, such as tuna, salmon, or sardines. Buy small amounts of raw or cooked vegetables, salads, or olives from the deli or salad bar at your store. Stock your pantry so you always have certain foods on hand, such as olive oil, canned tuna, canned tomatoes, rice, pasta, and beans. Cooking  Cook foods with extra-virgin olive oil instead of using butter or other vegetable oils. Have meat as a side dish, and have vegetables or grains as your main dish. This means having meat in small portions or adding small amounts of meat to foods like pasta or stew. Use beans or vegetables instead of meat in common dishes like chili or lasagna. Experiment with different cooking methods. Try roasting or broiling vegetables instead of  steaming or sauteing them. Add frozen vegetables to soups, stews, pasta, or rice. Add nuts or seeds for added healthy fat at each meal. You can add these to yogurt, salads, or vegetable dishes. Marinate fish or vegetables using olive oil, lemon juice, garlic, and fresh herbs. Meal planning  Plan to eat 1 vegetarian meal one day each week. Try to work up to 2 vegetarian meals, if possible. Eat seafood 2 or more times a week. Have healthy snacks readily available, such as: Vegetable sticks with hummus. Greek yogurt. Fruit and nut trail mix. Eat balanced meals throughout the week. This includes: Fruit: 2-3 servings a day Vegetables: 4-5 servings a day Low-fat dairy: 2 servings a day Fish, poultry, or lean meat: 1 serving a day Beans and legumes: 2 or more servings a week Nuts and seeds: 1-2 servings a day Whole grains: 6-8 servings a day Extra-virgin olive oil: 3-4 servings a day Limit red meat and sweets to only a few servings a month What are my food choices? Mediterranean diet Recommended Grains: Whole-grain pasta. Brown rice. Bulgar wheat. Polenta. Couscous. Whole-wheat bread. [place]. Vegetables: Artichokes. Beets. Broccoli. Cabbage. Carrots. Eggplant. Green beans. Chard. Kale. Spinach. Onions. Leeks. Peas. Squash. Tomatoes. Peppers. Radishes. Fruits: Apples. Apricots. Avocado. Berries. Bananas. Cherries. Dates. Figs.  Grapes. Lemons. Melon. Oranges. Peaches. Plums. Pomegranate. Meats and other protein foods: Beans. Almonds. Sunflower seeds. Pine nuts. Peanuts. [place]. Salmon. Scallops. Shrimp. [place]. Tilapia. Clams. Oysters. Eggs. Dairy: Low-fat milk. Cheese. Greek yogurt. Beverages: Water. Red wine. Herbal tea. Fats and oils: Extra virgin olive oil. Avocado oil. Grape seed oil. Sweets and desserts: [place] yogurt with honey. Baked apples. Poached pears. Trail mix. Seasoning and other foods: Basil. Cilantro. Coriander. Cumin. Mint. Parsley. Sage. Rosemary. Tarragon. Garlic.  Oregano. Thyme. Pepper. Balsalmic vinegar. Tahini. Hummus. Tomato sauce. Olives. Mushrooms. Limit these Grains: Prepackaged pasta or rice dishes. Prepackaged cereal with added sugar. Vegetables: Deep fried potatoes (french fries). Fruits: Fruit canned in syrup. Meats and other protein foods: Beef. Pork. Lamb. Poultry with skin. Hot dogs. [place] Salines. Dairy: Ice cream. Sour cream. Whole milk. Beverages: Juice. Sugar-sweetened soft drinks. Beer. Liquor and spirits. Fats and oils: Butter. Canola oil. Vegetable oil. Beef fat (tallow). Lard. Sweets and desserts: Cookies. Cakes. Pies. Candy. Seasoning and other foods: Mayonnaise. Premade sauces and marinades. The items listed may not be a complete list. Talk with your dietitian about what dietary choices are right for you. Summary The Mediterranean diet includes both food and lifestyle choices. Eat a variety of fresh fruits and vegetables, beans, nuts, seeds, and whole grains. Limit the amount of red meat and sweets that you eat. Talk with your health care provider about whether it is safe for you to drink red wine in moderation. This means 1 glass a day for nonpregnant women and 2 glasses a day for men. A glass of wine equals 5 oz (150 mL). This information is not intended to replace advice given to you by your health care provider. Make sure you discuss any questions you have with your health care provider. Document Released: 03/19/2016 Document Revised: 04/21/2016 Document Reviewed: 03/19/2016 Elsevier Interactive Patient Education  2017 [employer].

## 2022-05-26 ENCOUNTER — Other Ambulatory Visit: Payer: Self-pay | Admitting: Adult Health

## 2022-05-26 DIAGNOSIS — E1165 Type 2 diabetes mellitus with hyperglycemia: Secondary | ICD-10-CM

## 2022-05-26 MED ORDER — GLIPIZIDE ER 10 MG PO TB24
10.0000 mg | ORAL_TABLET | Freq: Every day | ORAL | 1 refills | Status: DC
Start: 2022-05-26 — End: 2022-06-17

## 2022-05-28 DIAGNOSIS — R4701 Aphasia: Secondary | ICD-10-CM | POA: Diagnosis not present

## 2022-06-16 ENCOUNTER — Telehealth: Payer: Self-pay

## 2022-06-16 NOTE — Telephone Encounter (Signed)
-  Caller states that her husband bend down and scratched his R leg and it started to bleeding and he is a diabetic. Is not bleeding profusely- on and off. Is described as small.  06/15/2022 5:07:43 PM SEE PCP WITHIN 3 DAYS Patsey Berthold, RN, [facility] Kayser  Comments User: Diana Eves, RN Date/Time Eilene Ghazi Time): 06/15/2022 5:08:06 PM Caller will call in the AM to try and schedule an appointment for husband.  Referrals REFERRED TO PCP OFFICE  06/16/22 5583 - Not bleeding now. Put neosporin. Site is not currently bleeding. Husband does not have fever, site is not swollen or red. Wife is requesting Tommi Rumps to look at site b/c she's concerned. Appt scheduled at [facility] available 06/17/22 at 3:30. Advised to call back if fever or colored drainage noted. Wife verb understanding.

## 2022-06-17 ENCOUNTER — Encounter: Payer: Self-pay | Admitting: Adult Health

## 2022-06-17 ENCOUNTER — Ambulatory Visit (INDEPENDENT_AMBULATORY_CARE_PROVIDER_SITE_OTHER): Payer: Medicare HMO | Admitting: Adult Health

## 2022-06-17 VITALS — BP 120/80 | HR 90 | Temp 98.4°F | Ht 70.0 in | Wt 203.0 lb

## 2022-06-17 DIAGNOSIS — E1165 Type 2 diabetes mellitus with hyperglycemia: Secondary | ICD-10-CM

## 2022-06-17 DIAGNOSIS — I10 Essential (primary) hypertension: Secondary | ICD-10-CM

## 2022-06-17 LAB — POCT GLYCOSYLATED HEMOGLOBIN (HGB A1C): Hemoglobin A1C: 10.8 % — AB (ref 4.0–5.6)

## 2022-06-17 MED ORDER — TRULICITY 1.5 MG/0.5ML ~~LOC~~ SOAJ: 1.5000 mg | SUBCUTANEOUS | 2 refills | Status: AC

## 2022-06-17 MED ORDER — PEN NEEDLES 31G X 5 MM MISC: 3 refills | Status: DC

## 2022-06-17 MED ORDER — GLIPIZIDE ER 10 MG PO TB24: 10.0000 mg | ORAL_TABLET | Freq: Every day | ORAL | 1 refills | Status: DC

## 2022-06-17 MED ORDER — LANTUS SOLOSTAR 100 UNIT/ML ~~LOC~~ SOPN: 10.0000 [IU] | PEN_INJECTOR | Freq: Every day | SUBCUTANEOUS | 0 refills | Status: DC

## 2022-06-17 NOTE — Patient Instructions (Addendum)
Your A1c was 10.8 - this is way to high   I want you to continue your current medication and I am going to add insulin - I will send in Lantus- lets start with 10 units every night   Please follow up in 3 months

## 2022-06-17 NOTE — Progress Notes (Addendum)
Subjective:    Patient ID: Scott Vasquez, male    DOB: 1940-11-16, 81 y.o.   MRN: [id number]  HPI 81 year old male who  has a past medical history of Anemia, Diabetes mellitus, Diverticulosis of colon, Hypertension, Stroke ([facility]), and Thyroid nodule.  He presents to the office today for follow-up regarding diabetes and hypertension.  His wife is with him at the visit today.  Was last seen for this roughly 7 months ago in April 2023.  Diabetes Mellitus Type 2 -he was last seen he was prescribed Trulicity 3 mg weekly, glipizide 10 mg extended release daily, Trulicity is no longer on his medication list and it looks like he has not had this in multiple months.  His last A1c was 9.3, this was in 2023 when he was admitted to the hospital.  His wife reports that he does drink a lot of sweet tea and eats a lot of junk food.   HTN -managed with lisinopril 40 mg daily.  He denies dizziness, lightheadedness, chest pain, or shortness of breath  Patient does report that their daughter helps with the medication and comes by every day.  Review of Systems See HPI   Past Medical History:  Diagnosis Date   Anemia    iron deficiency   Diabetes mellitus    type II   Diverticulosis of colon    Hypertension    Stroke (HCC)    numbness in left hand   Thyroid nodule     Social History   Socioeconomic History   Marital status: Married    Spouse name: Not on file   Number of children: 2   Years of education: 8   Highest education level: Not on file  Occupational History   Occupation: retired    Fish farm manager: [employer] Use   Smoking status: Former    Packs/day: 0.50    Years: 20.00    Total pack years: 10.00    Types: Cigarettes    Start date: 43    Quit date: 08/10/1958    Years since quitting: 63.8   Smokeless tobacco: Never  Vaping Use   Vaping Use: Never used  Substance and Sexual Activity   Alcohol use: No   Drug use: No   Sexual activity: Not on file  Other Topics Concern    Not on file  Social History Narrative   Pt is R handed   Lives in single story home with his wife, [name]   2 adult children   8th grade education   Retired Theatre stage manager from [employer]   Married 28 years   Social Determinants of Health   Financial Resource Strain: [place]  (05/09/2021)   Overall Financial Resource Strain (CARDIA)    Difficulty of Paying Living Expenses: Not hard at all  [place]: Not on file  Transportation Needs: No Transportation Needs (05/09/2021)   PRAPARE - Hydrologist (Medical): No    Lack of Transportation (Non-Medical): No  Physical Activity: Not on file  Stress: Not on file  Social Connections: Not on file  Intimate Partner Violence: Not on file    Past Surgical History:  Procedure Laterality Date   BACK SURGERY     CATARACT EXTRACTION, BILATERAL     SHOULDER SURGERY  08/2007   Supple   THYROID LOBECTOMY Left 04/29/2015   THYROID LOBECTOMY Left 04/29/2015   Procedure: THYROID LOBECTOMY;  Surgeon: Armandina Gemma, MD;  Location: [facility];  Service:  General;  Laterality: Left;    Family History  Problem Relation Age of Onset   Diabetes Other    Diabetes Father        Died from diabetic coma in 28-Sep-1970   Hypertension Father    Stroke Mother        Died in 38   Hypertension Mother     Allergies  Allergen Reactions   Actos [Pioglitazone] Other (See Comments)    Diarrhea    Metformin And Related Other (See Comments)    Diarrhea      Current Outpatient Medications on File Prior to Visit  Medication Sig Dispense Refill   ACCU-CHEK AVIVA PLUS test strip TEST BLOOD SUGAR TWICE DAILY 200 strip 3   aspirin 81 MG chewable tablet Chew 1 tablet (81 mg total) by mouth daily. 30 tablet 2   atorvastatin (LIPITOR) 80 MG tablet Take 1 tablet (80 mg total) by mouth daily. 30 tablet 2   Blood Glucose Monitoring Suppl (ACCU-CHEK GUIDE) w/Device KIT USE AS DIRECTED 1 kit 0   citalopram (CELEXA) 20 MG tablet Take 1 tablet (20 mg  total) by mouth daily. 90 tablet 3   cyanocobalamin 1000 MCG tablet Take 1,000 mcg by mouth daily.     donepezil (ARICEPT) 5 MG tablet Take 1 tablet (5 mg total) by mouth at bedtime. 30 tablet 0   levothyroxine (SYNTHROID) 50 MCG tablet Take 1 tablet (50 mcg total) by mouth daily. (Patient taking differently: Take 50 mcg by mouth daily before breakfast.) 90 tablet 3   lisinopril (ZESTRIL) 40 MG tablet Take 1 tablet (40 mg total) by mouth daily. 90 tablet 3   memantine (NAMENDA) 10 MG tablet 1 TAB TWICE A DAY (Patient taking differently: Take 10 mg by mouth 2 (two) times daily.) 180 tablet 3   oxybutynin (DITROPAN XL) 10 MG 24 hr tablet Take 1 tablet (10 mg total) by mouth at bedtime. 90 tablet 0   Vibegron (GEMTESA) 75 MG TABS Take 75 mg by mouth daily.     No current facility-administered medications on file prior to visit.    BP 120/80   Pulse 90   Temp 98.4 F (36.9 C) (Oral)   Ht _0  (1.778 m)   Wt 203 lb (92.1 kg)   SpO2 94%   BMI 29.13 kg/m       Objective:   Physical Exam Vitals and nursing note reviewed.  Constitutional:      Appearance: Normal appearance.  Cardiovascular:     Rate and Rhythm: Normal rate and regular rhythm.     Pulses: Normal pulses.     Heart sounds: Normal heart sounds.  Pulmonary:     Effort: Pulmonary effort is normal.     Breath sounds: Normal breath sounds.  Neurological:     Mental Status: He is alert. Mental status is at baseline.  Psychiatric:        Mood and Affect: Mood normal.        Behavior: Behavior normal.        Thought Content: Thought content normal.        Judgment: Judgment normal.       Assessment & Plan:   1. Uncontrolled type 2 diabetes mellitus with hyperglycemia (HCC) -We will keep him on glipizide 10 mg extended release, adding Trulicity 1.5 mg and add Lantus 10 units daily. - Follow up in 3 months  - glipiZIDE (GLUCOTROL XL) 10 MG 24 hr tablet; Take 1 tablet (10 mg total) by mouth  daily with breakfast.   Dispense: 90 tablet; Refill: 1 - POC HgB A1c- 10.8 - Microalbumin/Creatinine Ratio, Urine - insulin glargine (LANTUS SOLOSTAR) 100 UNIT/ML Solostar Pen; Inject 10 Units into the skin daily.  Dispense: 9 mL; Refill: 0 - Insulin Pen Needle (PEN NEEDLES) 31G X 5 MM MISC; Use with insulin pen  Dispense: 90 each; Refill: 3 - Dulaglutide (TRULICITY) 1.5 QB/1.6XI SOPN; Inject 1.5 mg into the skin once a week.  Dispense: 2 mL; Refill: 2  2. Essential hypertension - Controlled. No change in medications   [name], NP

## 2022-06-18 DIAGNOSIS — M79676 Pain in unspecified toe(s): Secondary | ICD-10-CM | POA: Diagnosis not present

## 2022-06-18 DIAGNOSIS — L84 Corns and callosities: Secondary | ICD-10-CM | POA: Diagnosis not present

## 2022-06-18 DIAGNOSIS — B351 Tinea unguium: Secondary | ICD-10-CM | POA: Diagnosis not present

## 2022-06-18 DIAGNOSIS — E1142 Type 2 diabetes mellitus with diabetic polyneuropathy: Secondary | ICD-10-CM | POA: Diagnosis not present

## 2022-06-18 LAB — MICROALBUMIN / CREATININE URINE RATIO
Creatinine,U: 235.9 mg/dL
Microalb Creat Ratio: 1.3 mg/g (ref 0.0–30.0)
Microalb, Ur: 3.1 mg/dL — ABNORMAL HIGH (ref 0.0–1.9)

## 2022-06-22 ENCOUNTER — Telehealth: Payer: Self-pay | Admitting: Adult Health

## 2022-06-22 NOTE — Telephone Encounter (Signed)
Pt wife called and stated [employer] wanted pt to start taking Dulaglutide (TRULICITY) 1.5 LP/3.7TK SOPN however pt has been put on insulin glargine (LANTUS SOLOSTAR) 100 UNIT/ML Solostar Pen by Dr and pt wife wanted to know if ot can take both at the same time?  Pt wife is aware Tommi Rumps is out of the office today and will be returning tomorrow.   Please advise.

## 2022-06-23 NOTE — Telephone Encounter (Signed)
Please advise 

## 2022-06-23 NOTE — Telephone Encounter (Signed)
Patient wife notified of update  and verbalized understanding.

## 2022-06-30 ENCOUNTER — Telehealth: Payer: Self-pay | Admitting: Adult Health

## 2022-06-30 NOTE — Telephone Encounter (Signed)
Pt wife is calling and would like to know if he should stay on medication if so what is the reason he is taking medication wife mention something about his bladder

## 2022-06-30 NOTE — Telephone Encounter (Signed)
Spoke to pt wife and she wanted to know if pt need to take oxybutynin. She stated she thought someone took pt off of this medication. Please advise

## 2022-07-01 ENCOUNTER — Telehealth: Payer: Self-pay | Admitting: Adult Health

## 2022-07-01 ENCOUNTER — Other Ambulatory Visit: Payer: Self-pay | Admitting: Adult Health

## 2022-07-01 NOTE — Telephone Encounter (Signed)
Patient notified of update  and verbalized understanding. 

## 2022-07-01 NOTE — Telephone Encounter (Signed)
Pt daughter dropped off FMLA forms to be signed. Pt daughter is aware that it will take between 5-7 business days for it to be completed.   Would like form to be faxed and call daughter once form is faxed. [phone]  Please advise.

## 2022-07-01 NOTE — Telephone Encounter (Signed)
Noted  

## 2022-07-09 NOTE — Telephone Encounter (Signed)
Scott Vasquez notified that PPW was filled out and faxed with confirmation. No further action needed.

## 2022-08-24 ENCOUNTER — Other Ambulatory Visit: Payer: Self-pay | Admitting: Adult Health

## 2022-08-24 DIAGNOSIS — E785 Hyperlipidemia, unspecified: Secondary | ICD-10-CM

## 2022-09-11 ENCOUNTER — Other Ambulatory Visit: Payer: Self-pay

## 2022-09-11 ENCOUNTER — Telehealth: Payer: Self-pay | Admitting: Adult Health

## 2022-09-11 DIAGNOSIS — E785 Hyperlipidemia, unspecified: Secondary | ICD-10-CM

## 2022-09-11 MED ORDER — ATORVASTATIN CALCIUM 80 MG PO TABS: 80.0000 mg | ORAL_TABLET | Freq: Every day | ORAL | 1 refills | Status: DC

## 2022-09-11 NOTE — Telephone Encounter (Signed)
Rx refilled.

## 2022-09-11 NOTE — Telephone Encounter (Signed)
Prescription Request  09/11/2022  Is this a "Controlled Substance" medicine? No  LOV: 06/17/2022  What is the name of the medication or equipment? atorvastatin (LIPITOR) 80 MG tablet   Have you contacted your pharmacy to request a refill? Yes   Which pharmacy would you like this sent to?  [facility], [place] [address] Phone: [phone] Fax: [phone]      Patient notified that their request is being sent to the clinical staff for review and that they should receive a response within 2 business days.   Please advise at Mobile [phone] (mobile)

## 2022-09-23 ENCOUNTER — Other Ambulatory Visit: Payer: Self-pay | Admitting: Physician Assistant

## 2022-09-23 ENCOUNTER — Other Ambulatory Visit: Payer: Self-pay | Admitting: Adult Health

## 2022-09-23 DIAGNOSIS — E1165 Type 2 diabetes mellitus with hyperglycemia: Secondary | ICD-10-CM

## 2022-09-23 DIAGNOSIS — I10 Essential (primary) hypertension: Secondary | ICD-10-CM

## 2022-09-23 DIAGNOSIS — E890 Postprocedural hypothyroidism: Secondary | ICD-10-CM

## 2022-09-23 DIAGNOSIS — I639 Cerebral infarction, unspecified: Secondary | ICD-10-CM

## 2022-09-23 DIAGNOSIS — N3281 Overactive bladder: Secondary | ICD-10-CM

## 2022-10-08 ENCOUNTER — Telehealth: Payer: Self-pay | Admitting: Adult Health

## 2022-10-08 ENCOUNTER — Encounter: Payer: Self-pay | Admitting: Physician Assistant

## 2022-10-08 NOTE — Telephone Encounter (Signed)
Please advise 

## 2022-10-08 NOTE — Telephone Encounter (Signed)
Prescription Request  10/08/2022   LOV: 06/17/2022  What is the name of the medication or equipment? oxybutynin (DITROPAN XL) 10 MG 24 hr tablet   Have you contacted your pharmacy to request a refill? Yes   Which pharmacy would you like this sent to?  [facility], [place] [address] Phone: [phone] Fax: [phone]    Patient notified that their request is being sent to the clinical staff for review and that they should receive a response within 2 business days.   Please advise at Mobile [phone] (mobile)

## 2022-10-09 DIAGNOSIS — H1132 Conjunctival hemorrhage, left eye: Secondary | ICD-10-CM | POA: Diagnosis not present

## 2022-10-09 NOTE — Telephone Encounter (Signed)
Patient spouses notified of update  and verbalized understanding.

## 2022-10-15 DIAGNOSIS — E1142 Type 2 diabetes mellitus with diabetic polyneuropathy: Secondary | ICD-10-CM | POA: Diagnosis not present

## 2022-10-15 DIAGNOSIS — L84 Corns and callosities: Secondary | ICD-10-CM | POA: Diagnosis not present

## 2022-10-15 DIAGNOSIS — M79676 Pain in unspecified toe(s): Secondary | ICD-10-CM | POA: Diagnosis not present

## 2022-10-15 DIAGNOSIS — B351 Tinea unguium: Secondary | ICD-10-CM | POA: Diagnosis not present

## 2022-10-21 DIAGNOSIS — H1132 Conjunctival hemorrhage, left eye: Secondary | ICD-10-CM | POA: Diagnosis not present

## 2022-11-19 ENCOUNTER — Ambulatory Visit: Payer: Medicare HMO | Admitting: Physician Assistant

## 2022-11-19 ENCOUNTER — Telehealth: Payer: Self-pay

## 2022-11-19 NOTE — Progress Notes (Signed)
Care Management & Coordination Services Pharmacy Team  Reason for Encounter: General adherence update   Contacted patient for general health update and medication adherence call.  Spoke with  Wife  on 11/19/2022   What concerns do you have about your medications? Wife reports he has been compliant in medication use lately and doing well with taking his medications as prescribed.  Patient's wife reports she has not noticed any complaint of side effects with his medications.   How often do you forget or accidentally miss a dose? Rarely  Do you use a pillbox? Yes  Are you having any problems getting your medications from your pharmacy? Wife reports No  Has the cost of your medications been a concern?  Wife reports No  The patient has not had an ED visit since last contact.   The patient denies problems with their health.   Patient denies concerns or questions for Scott Vasquez, PharmD at this time.   Counseled patient on: Great job taking medications and Access to carecoordination team for any cost, medication or pharmacy concerns..   Chart Updates:  Recent office visits:  06/17/22 Scott Frees, NP - Patient presented for Essential hypertension and other concerns. Prescribed Trulicity. Prescribed Lantus.  Recent consult visits:  10/21/22 Scott Vasquez (Ophthalmology)  - Claims encounter for Conjunctival hemorrhage of left eye. No other visit details available.  10/15/22  Scott Vasquez (Podiatry) - Claims encounter for type 2 diabetes mellitus with diabetic polyneuropathy and other concerns.No other visit details available.   10/09/22 Scott Vasquez - Claims encounter for conjunctival hemorrhage of left eye. No other visit details available.   06/18/22 Scott Vasquez (Podiatry) - Claims encounter for type 2 diabetes mellitus with diabetic polyneuropathy and other concerns.No other visit details available.   05/28/22 Scott Vasquez, SLP - Patient presented for Aphasia. No medication changes.    Hospital visits:  None in previous 6 months  Medications: Outpatient Encounter Medications as of 11/19/2022  Medication Sig   ACCU-CHEK AVIVA PLUS test strip TEST BLOOD SUGAR TWICE DAILY   aspirin 81 MG chewable tablet Chew 1 tablet (81 mg total) by mouth daily.   atorvastatin (LIPITOR) 80 MG tablet Take 1 tablet (80 mg total) by mouth daily.   Blood Glucose Monitoring Suppl (ACCU-CHEK GUIDE) w/Device KIT USE AS DIRECTED   citalopram (CELEXA) 20 MG tablet TAKE 1 TABLET EVERY DAY   cyanocobalamin 1000 MCG tablet Take 1,000 mcg by mouth daily.   donepezil (ARICEPT) 10 MG tablet TAKE 1 TABLET AT BEDTIME   glipiZIDE (GLUCOTROL XL) 10 MG 24 hr tablet Take 1 tablet (10 mg total) by mouth daily with breakfast.   insulin glargine (LANTUS SOLOSTAR) 100 UNIT/ML Solostar Pen Inject 10 Units into the skin daily.   Insulin Pen Needle (PEN NEEDLES) 31G X 5 MM MISC Use with insulin pen   levothyroxine (SYNTHROID) 50 MCG tablet TAKE 1 TABLET EVERY DAY   lisinopril (ZESTRIL) 40 MG tablet TAKE 1 TABLET EVERY DAY   memantine (NAMENDA) 10 MG tablet TAKE 1 TABLET TWICE DAILY   Vibegron (GEMTESA) 75 MG TABS Take 75 mg by mouth daily.   No facility-administered encounter medications on file as of 11/19/2022.    Recent vitals BP Readings from Last 3 Encounters:  06/17/22 120/80  05/20/22 (!) 166/90  03/18/22 (!) 143/75   Pulse Readings from Last 3 Encounters:  06/17/22 90  05/20/22 89  03/18/22 72   Wt Readings from Last 3 Encounters:  06/17/22 203 lb (92.1 kg)  05/20/22 201  lb (91.2 kg)  03/18/22 202 lb 9.6 oz (91.9 kg)   BMI Readings from Last 3 Encounters:  06/17/22 29.13 kg/m  05/20/22 28.84 kg/m  03/18/22 29.07 kg/m    Recent lab results    Component Value Date/Time   NA 138 01/16/2022 1436   K 4.0 01/16/2022 1436   CL 103 01/16/2022 1436   CO2 28 01/16/2022 1436   GLUCOSE 176 (H) 01/16/2022 1436   GLUCOSE 171 (H) 07/16/2006 1037   BUN 22 01/16/2022 1436   CREATININE 1.56  (H) 01/16/2022 1436   CREATININE 1.52 (H) 07/17/2020 1025   CALCIUM 9.3 01/16/2022 1436    Lab Results  Component Value Date   CREATININE 1.56 (H) 01/16/2022   GFR 41.06 (L) 01/16/2022   GFRNONAA 49 (L) 01/03/2022   GFRAA 49 (L) 07/17/2020   Lab Results  Component Value Date/Time   HGBA1C 10.8 (A) 06/17/2022 03:29 PM   HGBA1C 9.3 (H) 01/02/2022 04:26 AM   HGBA1C 11.2 (A) 11/11/2021 11:03 AM   HGBA1C 8.3 (H) 08/19/2021 08:21 AM   HGBA1C 7.4 (A) 02/06/2020 09:17 AM   HGBA1C 6.7 07/14/2018 10:09 AM   MICROALBUR 3.1 (H) 06/17/2022 03:40 PM   MICROALBUR 0.3 03/23/2014 10:27 AM    Lab Results  Component Value Date   CHOL 154 01/02/2022   HDL 57 01/02/2022   LDLCALC 85 01/02/2022   TRIG 61 01/02/2022   CHOLHDL 2.7 01/02/2022    Care Gaps: AWV - Overdue Zoster Vaccine - Overdue Eye Exam -Overdue COVID Booster - Overdue Foot Exam - Overdue   Star Rating Drugs:  Atorvastatin 40 mg - Last filled 09/14/22 90 DS at [facility] Lisinopril(Zestril) 40 mg - Last filled 09/28/22 90 DS at [facility] Glipizide (Glucotrol) 10 mg - Last filled 10/09/22 90 DS at [facility]   Pamala Duffel CMA Clinical Pharmacist Assistant [phone]

## 2022-12-07 ENCOUNTER — Ambulatory Visit (INDEPENDENT_AMBULATORY_CARE_PROVIDER_SITE_OTHER): Payer: Medicare HMO | Admitting: Physician Assistant

## 2022-12-07 ENCOUNTER — Encounter: Payer: Self-pay | Admitting: Physician Assistant

## 2022-12-07 VITALS — BP 140/71 | HR 91 | Ht 70.0 in | Wt 190.0 lb

## 2022-12-07 DIAGNOSIS — R4701 Aphasia: Secondary | ICD-10-CM | POA: Diagnosis not present

## 2022-12-07 DIAGNOSIS — F03B18 Unspecified dementia, moderate, with other behavioral disturbance: Secondary | ICD-10-CM

## 2022-12-07 NOTE — Progress Notes (Signed)
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 RH male  with a history of hypertension, hyperlipidemia, diabetes, subcortical stroke with aphasia,   with residual left hand tingling with moderate dementia, likely vascular followed by Dr. Pearlean Brownie (last visit 03/18/2022)  MRI of the brain personally reviewed showed progressive atrophy, chronic microvascular disease, and chronic microhemorrhage in the thalamus and brainstem (typically hypertensive). He is on donepezil 10 mg daily, memantine 10 mg bid.  He is seen today in follow up for memory loss. His memory is stable except for some difficulty with time and place; today's MMSE is 18/30. He continues to participate on his ADLs as tolerated, needing some assistance in some tasks such as showering. He no longer drives. Mood is stable with citalopram 20 mg daily.        Follow up in  6 months. Continue donepezil 10 mg daily and memantine 10 mg twice daily, side effects discussed Recommend good control of her cardiovascular risk factors Continue to control mood as per PCP, continue citalopram 20 mg daily     Subjective:    This patient is accompanied in the office by his wife who supplements the history.  Previous records as well as any outside records available were reviewed prior to todays visit. Patient was last seen on 05/20/2022.  Last MMSE on May 2023 was 21/30   Any changes in memory since last visit?  "He has some good and bad days ".  He is unable to remember well the names and questions or stories from a few minutes earlier.  She still able to recall names of people that he knows. He likes to watch Westerns, especially "Gunsmoke" repeats oneself?  Endorsed Disoriented when walking into a room?  Patient denies   Leaving objects in unusual places?    denies   Wandering behavior?  denies   Any personality changes since last visit?  denies   Any worsening depression?:  denies    Hallucinations or paranoia?  denies   Seizures?    denies    Any sleep changes?  Denies vivid dreams, REM behavior or sleepwalking   Sleep apnea?   denies   Any hygiene concerns?  " Only if he has to go out he takes a shower".  Wife assists him Independent of bathing and dressing?  Endorsed  Does the patient needs help with medications? Wife  is in charge   Who is in charge of the finances?  Wife and daughter is in charge     Any changes in appetite?    "Very Good" Patient have trouble swallowing?  denies   Does the patient cook? Yes  Any kitchen accidents such as leaving the stove on? Patient denies   Any headaches?   denies   Chronic back pain  denies   Ambulates with difficulty?  denies, but he refuses to use a cane for stability per wife's report  Recent falls or head injuries? denies     Unilateral weakness, numbness or tingling?  He sees Dr. Pearlean Brownie for aphasia after his stroke, with residual left hand numbness and tingling.  He participates in physical therapy and speech therapy. Any tremors?  denies   Any anosmia?  Patient denies   Any incontinence of urine?  Endorsed, wears depends. Any bowel dysfunction?     denies      Patient lives with wife and daughter  Does the patient drive? No longer drives  History on Initial Assessment 10/12/2018: This is a 82 year old right-handed man with a history of hypertension, hyperlipidemia, diabetes, subcortical stroke in 2001 with residual left hand tingling, presenting for evaluation of memory loss. He states his memory is bad, he started noticing changes a couple of years ago. He denies getting lost driving. He misses bills because "there is no money to pay them." He manages his own medications and denies missing doses, his wife agrees. He was reporting difficulty remembering what he read in the bible or magazine after he finishes reading. He also has difficulty remembering names. His wife states that yesterday the bank called her because he  was there and could not remember his social security number. She reports he got lost last month when he made a wrong turn. She reports other bills are paid but sometimes late, he has not paid taxes and car insurance from last year, he told her he will pay when ready. His wife asked to speak privately about her concerns, she is asking for a pill to "calm him down." She reports he is a "time bomb in the morning, I am his worst enemy." He would sit watching TV all day. Symptoms started last summer, he started sleeping upstairs because he was getting irritated with people. Their daughter lives with them. She denies any paranoia or hallucinations. Sleep is good. He is independent with dressing and bathing.   He denies any headaches, dizziness, diplopia, dysarthria/dysphagia, neck/back pain, focal weakness, bowel/bladder dysfunction, anosmia, or tremors. He has chronic left hand tingling since his stroke. He has had nasal discharge since throat surgery in 2017. He has been on Donepezil since 2017, no side effects. No family history of dementia. No history of significant head injuries or alcohol use.   I personally reviewed MRI brain in 2016 showing an acute punctate nonhemorrhagic infarct within the left paramedian superior midbrain and inferomedial thalamus, multiple remote bilateral lacunar infarcts, advanced chronic microvascular disease, multiple foci of remote hemorrhage      PREVIOUS MEDICATIONS:   CURRENT MEDICATIONS:  Outpatient Encounter Medications as of 12/07/2022  Medication Sig   ACCU-CHEK AVIVA PLUS test strip TEST BLOOD SUGAR TWICE DAILY   aspirin 81 MG chewable tablet Chew 1 tablet (81 mg total) by mouth daily.   atorvastatin (LIPITOR) 80 MG tablet Take 1 tablet (80 mg total) by mouth daily.   Blood Glucose Monitoring Suppl (ACCU-CHEK GUIDE) w/Device KIT USE AS DIRECTED   citalopram (CELEXA) 20 MG tablet TAKE 1 TABLET EVERY DAY   cyanocobalamin 1000 MCG tablet Take 1,000 mcg by mouth daily.    donepezil (ARICEPT) 10 MG tablet TAKE 1 TABLET AT BEDTIME   glipiZIDE (GLUCOTROL XL) 10 MG 24 hr tablet Take 1 tablet (10 mg total) by mouth daily with breakfast.   insulin glargine (LANTUS SOLOSTAR) 100 UNIT/ML Solostar Pen Inject 10 Units into the skin daily.   Insulin Pen Needle (PEN NEEDLES) 31G X 5 MM MISC Use with insulin pen   levothyroxine (SYNTHROID) 50 MCG tablet TAKE 1 TABLET EVERY DAY   lisinopril (ZESTRIL) 40 MG tablet TAKE 1 TABLET EVERY DAY   memantine (NAMENDA) 10 MG tablet TAKE 1 TABLET TWICE DAILY   Vibegron (GEMTESA) 75 MG TABS Take 75 mg by mouth daily.   No facility-administered encounter medications on file as of 12/07/2022.       12/07/2022    6:00 PM 03/18/2022   11:35 AM 05/20/2021    1:00 PM  MMSE - Mini Mental State Exam  Orientation to time 2 1 4   Orientation to Place 3 5 5   Registration 3 3 3   Attention/ Calculation 1 3 0  Recall 1 1 2   Language- name 2 objects 2 2 1   Language- repeat 1 1 0  Language- follow 3 step command 3 3 3   Language- read & follow direction 1 1 1   Write a sentence 1 1 1   Copy design 0 0 0  Total score 18 21 20       10/12/2018    2:00 PM  Montreal Cognitive Assessment   Visuospatial/ Executive (0/5) 1  Naming (0/3) 1  Attention: Read list of digits (0/2) 1  Attention: Read list of letters (0/1) 0  Attention: Serial 7 subtraction starting at 100 (0/3) 0  Language: Repeat phrase (0/2) 0  Language : Fluency (0/1) 0  Abstraction (0/2) 0  Delayed Recall (0/5) 1  Orientation (0/6) 6  Total 10  Adjusted Score (based on education) 11    Objective:     PHYSICAL EXAMINATION:    VITALS:   Vitals:   12/07/22 1450  BP: (!) 140/71  Pulse: 91  SpO2: 94%  Weight: 190 lb (86.2 kg)  Height: 5\' 10"  (1.778 m)    GEN:  The patient appears stated age and is in NAD. HEENT:  Normocephalic, atraumatic.   Neurological examination:  General: NAD, well-groomed, appears stated age. Orientation: The patient is alert. Oriented to  person, not to place and date  Cranial nerves: There is good facial symmetry. Can only speak short sentences in a few words with hesitancy, has moderate expressive aphasia and mild dysarthria.  Comprehension is impaired.  Fund of knowledge is reduced.  Recent and remote memory are impaired.  Attention is reduced.  Able to name objects and repeat phrases.  Hearing is intact to conversational tone.   Sensation: Sensation is intact to light touch throughout Motor: Strength is at least antigravity x4. DTR's 1/4 in UE/LE     Movement examination: Tone: There is normal tone in the UE/LE Abnormal movements:  no tremor.  No myoclonus.  No asterixis.   Coordination:  There is no decremation with RAM's. Normal finger to nose  Gait and Station: The patient has no difficulty arising out of a deep-seated chair without the use of the hands. The patient's stride length is good.  Gait is cautious and narrow.    Thank you for allowing [place] the opportunity to participate in the care of this nice patient. Please do not hesitate to contact us for any questions or concerns.   Total time spent on today's visit was 21 minutes dedicated to this patient today, preparing to see patient, examining the patient, ordering tests and/or medications and counseling the patient, documenting clinical information in the EHR or other health record, independently interpreting results and communicating results to the patient/family, discussing treatment and goals, answering patient's questions and coordinating care.  Cc:  [name], NP  Marlowe Kays 12/07/2022 7:00 PM

## 2022-12-07 NOTE — Patient Instructions (Signed)
It was a pleasure to see you today at our office.   Recommendations:  Continue donepezil 10 mg daily. Side effects were discussed  Continue Memantine 10 mg twice daily.Side effects were discussed  Continue the mood medication citalopram 20 mg daily Increase your level of activity  Follow up in 6 months     RECOMMENDATIONS FOR ALL PATIENTS WITH MEMORY PROBLEMS: 1. Continue to exercise (Recommend 30 minutes of walking everyday, or 3 hours every week) 2. Increase social interactions - continue going to [place] and enjoy social gatherings with friends and family 3. Eat healthy, avoid fried foods and eat more fruits and vegetables 4. Maintain adequate blood pressure, blood sugar, and blood cholesterol level. Reducing the risk of stroke and cardiovascular disease also helps promoting better memory. 5. Avoid stressful situations. Live a simple life and avoid aggravations. Organize your time and prepare for the next day in anticipation. 6. Sleep well, avoid any interruptions of sleep and avoid any distractions in the bedroom that may interfere with adequate sleep quality 7. Avoid sugar, avoid sweets as there is a strong link between excessive sugar intake, diabetes, and cognitive impairment We discussed the Mediterranean diet, which has been shown to help patients reduce the risk of progressive memory disorders and reduces cardiovascular risk. This includes eating fish, eat fruits and green leafy vegetables, nuts like almonds and hazelnuts, walnuts, and also use olive oil. Avoid fast foods and fried foods as much as possible. Avoid sweets and sugar as sugar use has been linked to worsening of memory function.  There is always a concern of gradual progression of memory problems. If this is the case, then we may need to adjust level of care according to patient needs. Support, both to the patient and caregiver, should then be put into place.    FALL PRECAUTIONS: Be cautious when walking. Scan the  area for obstacles that may increase the risk of trips and falls. When getting up in the mornings, sit up at the edge of the bed for a few minutes before getting out of bed. Consider elevating the bed at the head end to avoid drop of blood pressure when getting up. Walk always in a well-lit room (use night lights in the walls). Avoid area rugs or power cords from appliances in the middle of the walkways. Use a walker or a cane if necessary and consider physical therapy for balance exercise. Get your eyesight checked regularly.   HOME SAFETY: Consider the safety of the kitchen when operating appliances like stoves, microwave oven, and blender. Consider having supervision and share cooking responsibilities until no longer able to participate in those. Accidents with firearms and other hazards in the house should be identified and addressed as well.   ABILITY TO BE LEFT ALONE: If patient is unable to contact 911 operator, consider using LifeLine, or when the need is there, arrange for someone to stay with patients. Smoking is a fire hazard, consider supervision or cessation. Risk of wandering should be assessed by caregiver and if detected at any point, supervision and safe proof recommendations should be instituted       Mediterranean Diet A Mediterranean diet refers to food and lifestyle choices that are based on the traditions of countries located on the [employer]. This way of eating has been shown to help prevent certain conditions and improve outcomes for people who have chronic diseases, like kidney disease and heart disease. What are tips for following this plan? Lifestyle  Cook and eat  meals together with your family, when possible. Drink enough fluid to keep your urine clear or pale yellow. Be physically active every day. This includes: Aerobic exercise like running or swimming. Leisure activities like gardening, walking, or housework. Get 7-8 hours of sleep each night. If  recommended by your health care provider, drink red wine in moderation. This means 1 glass a day for nonpregnant women and 2 glasses a day for men. A glass of wine equals 5 oz (150 mL). Reading food labels  Check the serving size of packaged foods. For foods such as rice and pasta, the serving size refers to the amount of cooked product, not dry. Check the total fat in packaged foods. Avoid foods that have saturated fat or trans fats. Check the ingredients list for added sugars, such as corn syrup. Shopping  At the grocery store, buy most of your food from the areas near the walls of the store. This includes: Fresh fruits and vegetables (produce). Grains, beans, nuts, and seeds. Some of these may be available in unpackaged forms or large amounts (in bulk). Fresh seafood. Poultry and eggs. Low-fat dairy products. Buy whole ingredients instead of prepackaged foods. Buy fresh fruits and vegetables in-season from local farmers markets. Buy frozen fruits and vegetables in resealable bags. If you do not have access to quality fresh seafood, buy precooked frozen shrimp or canned fish, such as tuna, salmon, or sardines. Buy small amounts of raw or cooked vegetables, salads, or olives from the deli or salad bar at your store. Stock your pantry so you always have certain foods on hand, such as olive oil, canned tuna, canned tomatoes, rice, pasta, and beans. Cooking  Cook foods with extra-virgin olive oil instead of using butter or other vegetable oils. Have meat as a side dish, and have vegetables or grains as your main dish. This means having meat in small portions or adding small amounts of meat to foods like pasta or stew. Use beans or vegetables instead of meat in common dishes like chili or lasagna. Experiment with different cooking methods. Try roasting or broiling vegetables instead of steaming or sauteing them. Add frozen vegetables to soups, stews, pasta, or rice. Add nuts or seeds for added  healthy fat at each meal. You can add these to yogurt, salads, or vegetable dishes. Marinate fish or vegetables using olive oil, lemon juice, garlic, and fresh herbs. Meal planning  Plan to eat 1 vegetarian meal one day each week. Try to work up to 2 vegetarian meals, if possible. Eat seafood 2 or more times a week. Have healthy snacks readily available, such as: Vegetable sticks with hummus. Greek yogurt. Fruit and nut trail mix. Eat balanced meals throughout the week. This includes: Fruit: 2-3 servings a day Vegetables: 4-5 servings a day Low-fat dairy: 2 servings a day Fish, poultry, or lean meat: 1 serving a day Beans and legumes: 2 or more servings a week Nuts and seeds: 1-2 servings a day Whole grains: 6-8 servings a day Extra-virgin olive oil: 3-4 servings a day Limit red meat and sweets to only a few servings a month What are my food choices? Mediterranean diet Recommended Grains: Whole-grain pasta. Brown rice. Bulgar wheat. Polenta. Couscous. Whole-wheat bread. Orpah Cobb. Vegetables: Artichokes. Beets. Broccoli. Cabbage. Carrots. Eggplant. Green beans. Chard. Kale. Spinach. Onions. Leeks. Peas. Squash. Tomatoes. Peppers. Radishes. Fruits: Apples. Apricots. Avocado. Berries. Bananas. Cherries. Dates. Figs. Grapes. Lemons. Melon. Oranges. Peaches. Plums. Pomegranate. Meats and other protein foods: Beans. Almonds. Sunflower seeds. Pine nuts. Peanuts.  [place]. [place]. Scallops. Shrimp. Tuna. Tilapia. Clams. Oysters. Eggs. Dairy: Low-fat milk. Cheese. Greek yogurt. Beverages: Water. Red wine. Herbal tea. Fats and oils: Extra virgin olive oil. Avocado oil. Grape seed oil. Sweets and desserts: [place] yogurt with honey. Baked apples. Poached pears. Trail mix. Seasoning and other foods: Basil. Cilantro. Coriander. Cumin. Mint. Parsley. Sage. Rosemary. Tarragon. Garlic. Oregano. Thyme. Pepper. Balsalmic vinegar. Tahini. Hummus. Tomato sauce. Olives. Mushrooms. Limit these Grains:  Prepackaged pasta or rice dishes. Prepackaged cereal with added sugar. Vegetables: Deep fried potatoes (french fries). Fruits: Fruit canned in syrup. Meats and other protein foods: Beef. Pork. Lamb. Poultry with skin. Hot dogs. Tomasa Blase. Dairy: Ice cream. Sour cream. Whole milk. Beverages: Juice. Sugar-sweetened soft drinks. Beer. Liquor and spirits. Fats and oils: Butter. Canola oil. Vegetable oil. Beef fat (tallow). Lard. Sweets and desserts: Cookies. Cakes. Pies. Candy. Seasoning and other foods: Mayonnaise. Premade sauces and marinades. The items listed may not be a complete list. Talk with your dietitian about what dietary choices are right for you. Summary The Mediterranean diet includes both food and lifestyle choices. Eat a variety of fresh fruits and vegetables, beans, nuts, seeds, and whole grains. Limit the amount of red meat and sweets that you eat. Talk with your health care provider about whether it is safe for you to drink red wine in moderation. This means 1 glass a day for nonpregnant women and 2 glasses a day for men. A glass of wine equals 5 oz (150 mL). This information is not intended to replace advice given to you by your health care provider. Make sure you discuss any questions you have with your health care provider. Document Released: 03/19/2016 Document Revised: 04/21/2016 Document Reviewed: 03/19/2016 Elsevier Interactive Patient Education  2017 [employer].

## 2022-12-18 ENCOUNTER — Other Ambulatory Visit: Payer: Self-pay | Admitting: Adult Health

## 2022-12-18 DIAGNOSIS — E1165 Type 2 diabetes mellitus with hyperglycemia: Secondary | ICD-10-CM

## 2022-12-18 NOTE — Telephone Encounter (Signed)
Lm for pt to schedule a visit for more refills.

## 2022-12-18 NOTE — Telephone Encounter (Signed)
Patient need to schedule an ov for more refills. 

## 2022-12-25 ENCOUNTER — Ambulatory Visit: Payer: Medicare HMO | Admitting: Adult Health

## 2022-12-31 ENCOUNTER — Encounter: Payer: Self-pay | Admitting: Adult Health

## 2022-12-31 ENCOUNTER — Ambulatory Visit (INDEPENDENT_AMBULATORY_CARE_PROVIDER_SITE_OTHER): Payer: Medicare HMO | Admitting: Adult Health

## 2022-12-31 VITALS — BP 120/60 | HR 82 | Temp 98.3°F | Ht 70.0 in | Wt 187.0 lb

## 2022-12-31 DIAGNOSIS — Z7985 Long-term (current) use of injectable non-insulin antidiabetic drugs: Secondary | ICD-10-CM

## 2022-12-31 DIAGNOSIS — Z794 Long term (current) use of insulin: Secondary | ICD-10-CM | POA: Diagnosis not present

## 2022-12-31 DIAGNOSIS — I10 Essential (primary) hypertension: Secondary | ICD-10-CM | POA: Diagnosis not present

## 2022-12-31 DIAGNOSIS — E1165 Type 2 diabetes mellitus with hyperglycemia: Secondary | ICD-10-CM | POA: Diagnosis not present

## 2022-12-31 DIAGNOSIS — Z7984 Long term (current) use of oral hypoglycemic drugs: Secondary | ICD-10-CM | POA: Diagnosis not present

## 2022-12-31 DIAGNOSIS — F03B18 Unspecified dementia, moderate, with other behavioral disturbance: Secondary | ICD-10-CM

## 2022-12-31 DIAGNOSIS — Z742 Need for assistance at home and no other household member able to render care: Secondary | ICD-10-CM | POA: Diagnosis not present

## 2022-12-31 LAB — POCT GLYCOSYLATED HEMOGLOBIN (HGB A1C): Hemoglobin A1C: 9.4 % — AB (ref 4.0–5.6)

## 2022-12-31 MED ORDER — PEN NEEDLES 31G X 5 MM MISC
3 refills | Status: DC
Start: 2022-12-31 — End: 2023-07-15

## 2022-12-31 MED ORDER — TRULICITY 1.5 MG/0.5ML ~~LOC~~ SOPN
1.5000 mg | PEN_INJECTOR | SUBCUTANEOUS | 2 refills | Status: AC
Start: 2022-12-31 — End: 2023-03-31

## 2022-12-31 MED ORDER — GLIPIZIDE ER 10 MG PO TB24
10.0000 mg | ORAL_TABLET | Freq: Every day | ORAL | 1 refills | Status: DC
Start: 2022-12-31 — End: 2023-10-01

## 2022-12-31 MED ORDER — LANTUS SOLOSTAR 100 UNIT/ML ~~LOC~~ SOPN
10.0000 [IU] | PEN_INJECTOR | Freq: Every day | SUBCUTANEOUS | 1 refills | Status: DC
Start: 2022-12-31 — End: 2023-03-15

## 2022-12-31 NOTE — Patient Instructions (Addendum)
[facility]  Phone: [phone]  [facility] Phone: [phone]  [facility] [phone]  [facility] [phone]

## 2022-12-31 NOTE — Progress Notes (Signed)
Subjective:    Patient ID: Scott Vasquez, male    DOB: Jul 30, 1941, 82 y.o.   MRN: [id number]  Diabetes    82 year old male who  has a past medical history of Anemia, Diabetes mellitus, Diverticulosis of colon, Hypertension, Stroke (HCC), and Thyroid nodule.  He presents to the office today with his wife. He presents to the office today for multiple issues   He was last seen 6 months ago.   Diabetes Mellitus Type 2 -he was last seen he was prescribed Trulicity 3 mg weekly, glipizide 10 mg extended release daily, Trulicity is no longer on his medication list and it looks like he has not had this in multiple months.  His last A1c was 10.8,  His wife reports that he does drink a lot of sweet tea and eats a lot of junk food. Her daughter helps manage his medication but she believes he has not had his trulicity or insulin in quite some time  Lab Results  Component Value Date   HGBA1C 10.8 (A) 06/17/2022    HTN -managed with lisinopril 40 mg daily.  He denies dizziness, lightheadedness, chest pain, or shortness of breath  She is also looking for a home health aid to come into the home and help the patient shower. She reports that he only showers when there is some place to go. Other than that he sits on the couch and watches TV.     Review of Systems See HPI   Past Medical History:  Diagnosis Date   Anemia    iron deficiency   Diabetes mellitus    type II   Diverticulosis of colon    Hypertension    Stroke (HCC)    numbness in left hand   Thyroid nodule     Social History   Socioeconomic History   Marital status: Married    Spouse name: Not on file   Number of children: 2   Years of education: 8   Highest education level: Not on file  Occupational History   Occupation: retired    Associate Professor: [employer]  Tobacco Use   Smoking status: Former    Packs/day: 0.50    Years: 20.00    Additional pack years: 0.00    Total pack years: 10.00    Types: Cigarettes    Start date:  59    Quit date: 08/10/1958    Years since quitting: 64.4   Smokeless tobacco: Never  Vaping Use   Vaping Use: Never used  Substance and Sexual Activity   Alcohol use: No   Drug use: No   Sexual activity: Not on file  Other Topics Concern   Not on file  Social History Narrative   Pt is R handed   Lives in single story home with his wife, Scott Vasquez   2 adult children   8th grade education   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[employer]   Married 56 years   Social Determinants of Health   Financial Resource Strain: Low Risk  (05/09/2021)   Overall Financial Resource Strain (CARDIA)    Difficulty of Paying Living Expenses: Not hard at all  Food Insecurity: Not on file  Transportation Needs: No Transportation Needs (05/09/2021)   PRAPARE - Administrator, Civil Service (Medical): No    Lack of Transportation (Non-Medical): No  Physical Activity: Not on file  Stress: Not on file  Social Connections: Not on file  Intimate Partner Violence: Not on file  Past Surgical History:  Procedure Laterality Date   BACK SURGERY     CATARACT EXTRACTION, BILATERAL     SHOULDER SURGERY  08/2007   Supple   THYROID LOBECTOMY Left 04/29/2015   THYROID LOBECTOMY Left 04/29/2015   Procedure: THYROID LOBECTOMY;  Surgeon: Darnell Level, MD;  Location: [facility] OR;  Service: General;  Laterality: Left;    Family History  Problem Relation Age of Onset   Diabetes Other    Diabetes Father        Died from diabetic coma in 06-Jan-1971   Hypertension Father    Stroke Mother        Died in 55   Hypertension Mother     Allergies  Allergen Reactions   Actos [Pioglitazone] Other (See Comments)    Diarrhea    Metformin And Related Other (See Comments)    Diarrhea      Current Outpatient Medications on File Prior to Visit  Medication Sig Dispense Refill   ACCU-CHEK AVIVA PLUS test strip TEST BLOOD SUGAR TWICE DAILY 200 strip 3   aspirin 81 MG chewable tablet Chew 1 tablet (81 mg total) by mouth daily. 30  tablet 2   atorvastatin (LIPITOR) 80 MG tablet Take 1 tablet (80 mg total) by mouth daily. 90 tablet 1   Blood Glucose Monitoring Suppl (ACCU-CHEK GUIDE) w/Device KIT USE AS DIRECTED 1 kit 0   citalopram (CELEXA) 20 MG tablet TAKE 1 TABLET EVERY DAY 90 tablet 3   cyanocobalamin 1000 MCG tablet Take 1,000 mcg by mouth daily.     donepezil (ARICEPT) 10 MG tablet TAKE 1 TABLET AT BEDTIME 90 tablet 3   levothyroxine (SYNTHROID) 50 MCG tablet TAKE 1 TABLET EVERY DAY 90 tablet 3   lisinopril (ZESTRIL) 40 MG tablet TAKE 1 TABLET EVERY DAY 90 tablet 3   memantine (NAMENDA) 10 MG tablet TAKE 1 TABLET TWICE DAILY 180 tablet 3   Vibegron (GEMTESA) 75 MG TABS Take 75 mg by mouth daily.     No current facility-administered medications on file prior to visit.    BP 120/60   Pulse 82   Temp 98.3 F (36.8 C) (Oral)   Ht 5\' 10"  (1.778 m)   Wt 187 lb (84.8 kg)   SpO2 95%   BMI 26.83 kg/m       Objective:   Physical Exam Vitals and nursing note reviewed.  Constitutional:      Appearance: Normal appearance.  Cardiovascular:     Rate and Rhythm: Normal rate and regular rhythm.     Pulses: Normal pulses.     Heart sounds: Normal heart sounds.  Pulmonary:     Effort: Pulmonary effort is normal.     Breath sounds: Normal breath sounds.  Musculoskeletal:        General: Normal range of motion.  Skin:    General: Skin is warm and dry.  Neurological:     General: No focal deficit present.     Mental Status: He is alert. Mental status is at baseline.  Psychiatric:        Mood and Affect: Mood normal.        Behavior: Behavior normal.        Thought Content: Thought content normal.        Judgment: Judgment normal.        Assessment & Plan:  1. Uncontrolled type 2 diabetes mellitus with hyperglycemia (HCC)  - POC HgB A1c- 9.4 - Unsure of what medication he has and does not  have. It looks like he has been out of his medications for atleast 2 months.  - Follow up in 3 months  - glipiZIDE  (GLUCOTROL XL) 10 MG 24 hr tablet; Take 1 tablet (10 mg total) by mouth daily with breakfast.  Dispense: 90 tablet; Refill: 1 - insulin glargine (LANTUS SOLOSTAR) 100 UNIT/ML Solostar Pen; Inject 10 Units into the skin daily.  Dispense: 9 mL; Refill: 1 - Insulin Pen Needle (PEN NEEDLES) 31G X 5 MM MISC; Use with insulin pen  Dispense: 90 each; Refill: 3 - Dulaglutide (TRULICITY) 1.5 MG/0.5ML SOPN; Inject 1.5 mg into the skin once a week.  Dispense: 2 mL; Refill: 2  2. Essential hypertension - Controlled. No change in medication   3. Need for home health care - Advised to call insurance and see if home health aid is covered. She was also given a list of local agencies to contact.  - Will reach out to Child psychotherapist as well to see if there is any help available.  - AMB Referral to [facility] Coordinaton (ACO Patients)  4. Moderate dementia with behavioral disturbance (HCC)  - AMB Referral to Community Care Coordinaton (ACO Patients)  [name], NP  Time spent with patient today was 32 minutes which consisted of chart review, discussing diagnosis, work up, treatment answering questions and documentation.

## 2023-01-01 ENCOUNTER — Ambulatory Visit: Payer: Medicare HMO | Admitting: Adult Health

## 2023-01-01 ENCOUNTER — Telehealth: Payer: Self-pay | Admitting: *Deleted

## 2023-01-01 NOTE — Progress Notes (Signed)
  Care Coordination   Note   01/01/2023 Name: Scott Vasquez MRN: [id number] DOB: 17-Apr-1941  Scott Vasquez is a 82 y.o. year old male who sees Nafziger, Kandee Keen, NP for primary care. I reached out to [employer] by phone today to offer care coordination services.  Mr. Giammanco was given information about Care Coordination services today including:   The Care Coordination services include support from the care team which includes your Nurse Coordinator, Clinical Social Worker, or Pharmacist.  The Care Coordination team is here to help remove barriers to the health concerns and goals most important to you. Care Coordination services are voluntary, and the patient may decline or stop services at any time by request to their care team member.   Care Coordination Consent Status: Patient agreed to services and verbal consent obtained.   Follow up plan:  Telephone appointment with care coordination team member scheduled for:  01/07/2023   Encounter Outcome:  Pt. Scheduled from referral   Burman Nieves, [facility] Care Coordination Care Guide Direct Dial: [phone]

## 2023-01-07 ENCOUNTER — Telehealth: Payer: Self-pay

## 2023-01-07 ENCOUNTER — Ambulatory Visit: Payer: Self-pay

## 2023-01-07 DIAGNOSIS — E1142 Type 2 diabetes mellitus with diabetic polyneuropathy: Secondary | ICD-10-CM | POA: Diagnosis not present

## 2023-01-07 DIAGNOSIS — B351 Tinea unguium: Secondary | ICD-10-CM | POA: Diagnosis not present

## 2023-01-07 DIAGNOSIS — L84 Corns and callosities: Secondary | ICD-10-CM | POA: Diagnosis not present

## 2023-01-07 DIAGNOSIS — M79676 Pain in unspecified toe(s): Secondary | ICD-10-CM | POA: Diagnosis not present

## 2023-01-07 NOTE — Patient Instructions (Signed)
Visit Information  Thank you for taking time to visit with me today. Please don't hesitate to contact me if I can be of assistance to you.   Following are the goals we discussed today:  - Review mailed educational materials regarding options for in home care - Engage with your primary care providers office and pharmacy team to address medication needs related to the ongoing management of your Diabetes  Our next appointment is by telephone on 6/20 at 1:00 pm  Please call the care guide team at [phone] if you need to cancel or reschedule your appointment.   If you are experiencing a Mental Health or Behavioral Health Crisis or need someone to talk to, please call the [facility]: [phone] call [phone]  Patient verbalizes understanding of instructions and care plan provided today and agrees to view in MyChart. Active MyChart status and patient understanding of how to access instructions and care plan via MyChart confirmed with patient.     [name], BSW, CDP Social Worker, Certified Dementia Practitioner [facility] Care Management  Care Coordination [phone]

## 2023-01-07 NOTE — Patient Outreach (Signed)
  Care Coordination   01/07/2023 Name: Scott Vasquez MRN: [id number] DOB: November 03, 1940   Care Coordination Outreach Attempts:  An unsuccessful telephone outreach was attempted for a scheduled appointment today.  Follow Up Plan:  Additional outreach attempts will be made to offer the patient care coordination information and services.   Encounter Outcome:  No Answer   Care Coordination Interventions:  No, not indicated    [name], BSW, CDP Social Worker, Certified Dementia Practitioner [facility] Care Management  Care Coordination [phone]

## 2023-01-07 NOTE — Patient Outreach (Signed)
Care Coordination   Initial Visit Note   01/07/2023 Name: Martice Hellums Deschene MRN: [id number] DOB: 04/10/1941  Ramonte Fecteau Vieau is a 82 y.o. year old male who sees Nafziger, Kandee Keen, NP for primary care. I  spoke with patients spouse and primary caregiver [name] by phone to assist with care coordination needs.  What matters to the patients health and wellness today?  The patient is in need of assistance with bathing. The family prefers a male caregiver.    Goals Addressed             This Visit's Progress    Care Coordination Activities       Care Coordination Interventions: Discussed the patient is not bathing on a regular basis, patients spouse would like a male caregiver three times weekly to assist with bathing Education provided to Mrs. Laton that the patients health plan does not cover the cost of a caregiver. Mrs. Vanhise states if it is not covered she does not want to privately pay Reviewed if the patient were to receive Medicaid he may be eligible for [facility] services - Mrs. Biehl reports the patient is not eligible for Medicaid coverage Educated Mrs. Falter on Aging Disability and Transit Services (ADTS) home care program advising the patient may qualify for caregiver hours offered by this resource; provided information via mail for her to review with her daughter Discussed plan for SW to follow up over the next month to confirm receipt of mailed resource and assist with ongoing care coordination needs Performed chart review to note patient was last seen by his primary care provider on 12/31/22. During this office visit there was concern with patients medication adherence related to diabetic medications Assessed if medications have been obtained from the pharmacy; Mrs. Schaafsma reports the patient "only takes Trulicity for Diabetes". The patient does not currently have this on hand but [employer] is supposed to be sending via mail order pharmacy Noted other medications are currently prescribed to  manage patients DM II including Glipizide and Lantus Solostar; spouse reports the patient does not take any other injectable medications Collaboration with [facility] team to communicate concern with medication adherence and inquire if their team plans to follow up with the patient to further assist with disease management Collaboration with patients primary care provider [name], NP advising of interventions and plan for SW to communicate adherence concerns with patients pharmacy team         SDOH assessments and interventions completed:  Yes  SDOH Interventions Today    Flowsheet Row Most Recent Value  SDOH Interventions   Food Insecurity Interventions Intervention Not Indicated  Housing Interventions Intervention Not Indicated  Transportation Interventions Intervention Not Indicated        Care Coordination Interventions:  Yes, provided   Interventions Today    Flowsheet Row Most Recent Value  Chronic Disease   Chronic disease during today's visit Diabetes  General Interventions   General Interventions Discussed/Reviewed General Interventions Discussed, Communication with, Level of Care  Communication with PCP/Specialists, Pharmacists  Level of Care Personal Care Services  [Education on ADTS In Home Care Program]  Education Interventions   Education Provided Provided Education, Provided Printed Education  Provided Verbal Education On Community Resources  Pharmacy Interventions   Pharmacy Dicussed/Reviewed Pharmacy Topics Discussed, Medication Adherence  [Collaboration with PCP and Pharmacy Team regarding concerns with medication adherence]        Follow up plan: Follow up call scheduled for 6/20    Encounter Outcome:  Pt. Visit  Completed   [name], Kenard Gower, CDP Social Worker, Certified Dementia Practitioner [facility] Care Management  Care Coordination [phone]

## 2023-01-08 ENCOUNTER — Telehealth: Payer: Self-pay

## 2023-01-08 NOTE — Progress Notes (Signed)
Patient ID: Scott Vasquez, male   DOB: [birth year], 82 y.o.   MRN: [id number]   Care Management & Coordination Services Pharmacy Team  Reason for Encounter: General adherence update   Contacted patient for general health update and medication adherence call.  Spoke with wife Scott Vasquez on 01/08/2023    What concerns do you have about your medications? Wife reports none she states he has no issue taking and with the compliance of his medications, has not complained of any side effects.  How often do you forget or accidentally miss a dose? Never  Do you use a pillbox? Yes  Are you having any problems getting your medications from your pharmacy? No  Has the cost of your medications been a concern? No  The patient has not had an ED visit since last contact.   The patient denies problems with their health.   Patient denies concerns or questions for Scott Vasquez, PharmD at this time. , she reports the only concern she has for him is getting him to regularly participate in hygiene practices. She reports he will only agree to this if they are scheduled to go out somewhere will will not if he knows he will be sitting in the house. Asked if he would be up to if if they went out for a walk or something small she states she has tried with no success. Encouraged her to keep trying or maybe have a weekly date out that he would enjoy and be up for hygiene prior. [facility] is to provide her information with in home care options that may also help.  Scheduled  follow up with Pharmacist for 02/2023 she is aware and in agreement.   Counseled patient on: Great job taking medications and Access to carecoordination team for any cost, medication or pharmacy concerns.   Chart Updates:  Recent office visits:  12/31/22 Scott Frees, NP - Patient presented for Uncontrolled type 2 diabetes mellitus with hyperglycemia and other concerns. Prescribed Trulicity.  Recent consult visits:  01/07/23 Scott Vasquez [facility] Care  Coordination phone visit. No medication changes.   12/07/22 Scott Vasquez (Neurology) - Patient presented for Moderate dementia with behavioral disturbance and other concerns. No medication changes.   Hospital visits:  None in previous 6 months  Medications: Outpatient Encounter Medications as of 01/08/2023  Medication Sig   ACCU-CHEK AVIVA PLUS test strip TEST BLOOD SUGAR TWICE DAILY   aspirin 81 MG chewable tablet Chew 1 tablet (81 mg total) by mouth daily.   atorvastatin (LIPITOR) 80 MG tablet Take 1 tablet (80 mg total) by mouth daily.   Blood Glucose Monitoring Suppl (ACCU-CHEK GUIDE) w/Device KIT USE AS DIRECTED   citalopram (CELEXA) 20 MG tablet TAKE 1 TABLET EVERY DAY   cyanocobalamin 1000 MCG tablet Take 1,000 mcg by mouth daily.   donepezil (ARICEPT) 10 MG tablet TAKE 1 TABLET AT BEDTIME   Dulaglutide (TRULICITY) 1.5 MG/0.5ML SOPN Inject 1.5 mg into the skin once a week.   glipiZIDE (GLUCOTROL XL) 10 MG 24 hr tablet Take 1 tablet (10 mg total) by mouth daily with breakfast.   insulin glargine (LANTUS SOLOSTAR) 100 UNIT/ML Solostar Pen Inject 10 Units into the skin daily.   Insulin Pen Needle (PEN NEEDLES) 31G X 5 MM MISC Use with insulin pen   levothyroxine (SYNTHROID) 50 MCG tablet TAKE 1 TABLET EVERY DAY   lisinopril (ZESTRIL) 40 MG tablet TAKE 1 TABLET EVERY DAY   memantine (NAMENDA) 10 MG tablet TAKE 1 TABLET TWICE  DAILY   Vibegron (GEMTESA) 75 MG TABS Take 75 mg by mouth daily.   No facility-administered encounter medications on file as of 01/08/2023.    Recent vitals BP Readings from Last 3 Encounters:  12/31/22 120/60  12/07/22 (!) 140/71  06/17/22 120/80   Pulse Readings from Last 3 Encounters:  12/31/22 82  12/07/22 91  06/17/22 90   Wt Readings from Last 3 Encounters:  12/31/22 187 lb (84.8 kg)  12/07/22 190 lb (86.2 kg)  06/17/22 203 lb (92.1 kg)   BMI Readings from Last 3 Encounters:  12/31/22 26.83 kg/m  12/07/22 27.26 kg/m  06/17/22 29.13  kg/m    Recent lab results    Component Value Date/Time   NA 138 01/16/2022 1436   K 4.0 01/16/2022 1436   CL 103 01/16/2022 1436   CO2 28 01/16/2022 1436   GLUCOSE 176 (H) 01/16/2022 1436   GLUCOSE 171 (H) 07/16/2006 1037   BUN 22 01/16/2022 1436   CREATININE 1.56 (H) 01/16/2022 1436   CREATININE 1.52 (H) 07/17/2020 1025   CALCIUM 9.3 01/16/2022 1436    Lab Results  Component Value Date   CREATININE 1.56 (H) 01/16/2022   GFR 41.06 (L) 01/16/2022   GFRNONAA 49 (L) 01/03/2022   GFRAA 49 (L) 07/17/2020   Lab Results  Component Value Date/Time   HGBA1C 9.4 (A) 12/31/2022 02:55 PM   HGBA1C 10.8 (A) 06/17/2022 03:29 PM   HGBA1C 9.3 (H) 01/02/2022 04:26 AM   HGBA1C 8.3 (H) 08/19/2021 08:21 AM   HGBA1C 7.4 (A) 02/06/2020 09:17 AM   HGBA1C 6.7 07/14/2018 10:09 AM   MICROALBUR 3.1 (H) 06/17/2022 03:40 PM   MICROALBUR 0.3 03/23/2014 10:27 AM    Lab Results  Component Value Date   CHOL 154 01/02/2022   HDL 57 01/02/2022   LDLCALC 85 01/02/2022   TRIG 61 01/02/2022   CHOLHDL 2.7 01/02/2022    Care Gaps: AWV - Overdue Zoster Vaccine - Overdue Eye Exam - Overdue COVID Booster - Overdue Foot Exam - Overdue Diabetic Urine - Due Soon  Star Rating Drugs:  Atorvastatin 80 mg - Last filled 11/26/22 90 DS at [facility] Trulicity 1.5 mg - Last filled 01/01/23 84 DS at [facility] Glipizide 10 mg - Last filled 01/01/23 90 DS at [facility] Lisinopril 40 mg - Last filled 12/10/22 90 DS at [facility]  Lantus - Last filled 01/01/23 90 DS at [facility]    Patient Assistance:   Scott Vasquez CMA Clinical Pharmacist Assistant [phone]

## 2023-01-13 ENCOUNTER — Telehealth: Payer: Self-pay

## 2023-01-13 NOTE — Telephone Encounter (Signed)
Spoke to pt spouse and she stated pt is taking medications as listed below.

## 2023-01-13 NOTE — Telephone Encounter (Signed)
-----   Message from [name], NP sent at 01/13/2023  7:00 AM EDT ----- Regarding: RE: FYI Can you let Mr. Burkel wife know that I would like him to take his glipizide and insulin with his Trulicity  Thanks   ----- Message ----- From: [name] Sent: 01/12/2023  10:35 AM EDT To: [name], NP Subject: RE: Lorain Childes                                        Thank you for this feedback. I did also send this information to the pharmacy team. He has an appt with PharmD 7/26.  Based on what his wife told me I am not sure he is taking insulin or glipizde but hopefully she was just confused during our conversation. I am scheduled to follow up later in the month and will try to confirm he is taking all three medications.  Enrique Sack ----- Message ----- From: [name], NP Sent: 01/12/2023   6:33 AM EDT To: [name] Subject: RE: Lorain Childes                                        He probably would benefit from getting back on schedule with the pharmacy team. He should be on Trulicity as well as his insulin therapy and glipizide  Kandee Keen   ----- Message ----- From: [name] Sent: 01/07/2023  11:07 AM EDT To: [name], NP Subject: Jonathon Resides! I wanted to let you know I was able to speak with patients spouse regarding care in the home. [employer] does not cover a bath aid but I am going to try and get her linked to some resources.  I reviewed your latest OV note which stated the patient has been off DM medications and/or not taking as prescribed. I asked his spouse today if they picked up his DM meds and she said "the only thing he takes is Trulicity". They do not have this on hand but she states [employer] is mailing it now. I noticed you also have Lantus and Glipizide listed. I asked [name] if he took any other medications for his diabetes and she said "no, only Trulicity". It seems there may be some confusion with his prescribed regiment on her end.    Pt is active with [place] pharmacy team but I do not see where the pharmacist has spoken with the patient in quite some time. I will outreach their team to inquire if they plan to remain engaged with this pt. If you feel he would benefit from a nurse case manager following him please let our team know.  Thank you,  [name], BSW, CDP Social Worker, Certified Dementia Practitioner [facility] Care Management  Care Coordination [phone]

## 2023-01-15 ENCOUNTER — Ambulatory Visit: Payer: Self-pay

## 2023-01-15 NOTE — Patient Outreach (Addendum)
  Care Coordination   Follow Up Visit Note   01/15/2023 Name: Scott Vasquez MRN: [id number] DOB: 1941/08/05  Scott Vasquez is a 82 y.o. year old male who sees Nafziger, Kandee Keen, NP for primary care. I  spoke with the patients son Scott Vasquez by phone to address care coordination needs.  What matters to the patients health and wellness today?  The patient is in need of resources to assist with in home support    Goals Addressed             This Visit's Progress    Care Coordination Activities   On track    Care Coordination Interventions: Education provided to patients son Scott Vasquez about [facility] Home Care program Discussed plans for [place] to contact [facility] to inquire if the patient is eligible for a home care grant Reviewed based on reported income the patient would most likely not qualify for full Medicaid benefits but may qualify for MQB Medicaid which covers the patients Medicare monthly premium Advised Scott Vasquez if he is interested he can apply for Medicaid online via [employer]PASS Determined the patient continues to decline and his spouse is experiencing caregiver stress Educated on the process for placement should the patient need to be placed; family is not interested in placement at this time Discussed plans for SW to follow up with the patient and his family over the next month 3:30 pm Inbound call received from patients wife who questions why the patients primary care provider referred the patient to care coordination instead of [employer] for in home aid support Education provided on the role of the care coordination team advising the patients health plan does not cover the cost of an aid in the home. Further advised SW role is to help navigate resource needs Discussed Mrs. Estrin contacted [employer] and was advised they would cover the cost of home health Reviewed the difference in home health services and custodial CNA care in the home Advised Mrs. Marineau SW just ended a call with her son and provided  resource information         SDOH assessments and interventions completed:  No     Care Coordination Interventions:  Yes, provided   Interventions Today    Flowsheet Row Most Recent Value  Chronic Disease   Chronic disease during today's visit Diabetes  General Interventions   General Interventions Discussed/Reviewed General Interventions Reviewed, [employer] Resources  Education Interventions   Education Provided Provided Education  Provided Verbal Education On Insurance Plans        Follow up plan:  SW will follow up with [place] as scheduled on 6/20    Encounter Outcome:  Pt. Visit Completed   [name], Kenard Gower, CDP Social Worker, Certified Dementia Practitioner [facility] Care Management  Care Coordination [phone]

## 2023-01-15 NOTE — Patient Instructions (Signed)
Visit Information  Thank you for taking time to visit with me today. Please don't hesitate to contact me if I can be of assistance to you.   Following are the goals we discussed today:  - Contact [facility] regarding home care program at [phone] - Apply for Medicaid to determine eligibility via ePASS website   Our next appointment is by telephone on 6/20 at 1:00 pm  Please call the care guide team at [phone] if you need to cancel or reschedule your appointment.   If you are experiencing a Mental Health or Behavioral Health Crisis or need someone to talk to, please call the [facility]: [phone] call 911  Patient verbalizes understanding of instructions and care plan provided today and agrees to view in MyChart. Active MyChart status and patient understanding of how to access instructions and care plan via MyChart confirmed with patient.     [name], BSW, CDP Social Worker, Certified Dementia Practitioner [facility] Care Management  Care Coordination [phone]

## 2023-01-18 ENCOUNTER — Telehealth: Payer: Self-pay | Admitting: Adult Health

## 2023-01-18 NOTE — Telephone Encounter (Signed)
Pt son is call back about some Home help for pt from [facility] and want a call back at [phone].

## 2023-01-20 NOTE — Patient Outreach (Signed)
  Care Coordination    01/20/2023 Name: Zeev Deakins Feick MRN: [id number] DOB: 06/17/1941  Yitzchak Kothari Hakes is a 82 y.o. year old male who sees Nafziger, Kandee Keen, NP for primary care. I  collaborated with Waymon Amato with patients primary care providers office to discuss patients goal of obtaining home health services. Discussed SW has been trying to link patient to caregiver resource options. Reviewed if the patients primary care provider wants the patient to be active with home health services such as skilled nursing or physical therapy he will need to place orders to home health. Advised that while home health is open it is possible to add a home health aid which can assist with bathing but this is a short term service and can only be involved when skilled nursing or PT is also involved.   What matters to the patients health and wellness today?  SW did not speak with the patient today. SW will follow up with the patient and his family regarding resource needs after the family speaks with primary care providers office regarding if home health is desired at this time.    SDOH assessments and interventions completed:  No     Care Coordination Interventions:  Yes, provided   Interventions Today    Flowsheet Row Most Recent Value  Chronic Disease   Chronic disease during today's visit Diabetes  General Interventions   General Interventions Discussed/Reviewed Communication with  Communication with PCP/Specialists        Follow up plan:  SW will continue to follow    Encounter Outcome:  Pt. Visit Completed   [name], Kenard Gower, CDP Social Worker, Certified Dementia Practitioner [facility] Care Management  Care Coordination [phone]

## 2023-01-20 NOTE — Telephone Encounter (Signed)
Spoke to pt son Karren Burly and he stated that he is waiting on [place] to [employer]. Pt is wanting to get help through [facility] to help with home health. I advised that I will route to [facility].

## 2023-01-20 NOTE — Telephone Encounter (Signed)
Marinda Elk N58 minutes ago (11:14 AM)   DS Son called in stating Enrique Sack called and he was returning her call.

## 2023-01-20 NOTE — Telephone Encounter (Signed)
Spoke to pt son and advised of message below. No further actions needed at this time.

## 2023-01-20 NOTE — Telephone Encounter (Signed)
Son called in stating Scott Vasquez called and he was returning her call.

## 2023-01-20 NOTE — Telephone Encounter (Signed)
[  10:21 AM] Scott Vasquez! So I do not see a home health order entered. I gave the family some resources to get a caregiver in the home. The spouse seems very confused talking to me. If the PCP wants to order home health he can. I have explained to the family that home health is short term and will only cover a bath aid if nursing or PT are involved AND if there is a bath aide available. I was trying to get him linked to caregiver resources but there are usually waiting lists.   heart 1 [10:22 AM] Scott Vasquez, Scott Vasquez Is the patients family saying they havent received anything or home health? We do not send orders to home health agencies the PCP has to do that if that is the service that is desired   Spoke with Kandee Keen and he advised that a HH order was not placed bc it would only cover up to 2 weeks of help and not long term.

## 2023-01-22 ENCOUNTER — Ambulatory Visit: Payer: Self-pay

## 2023-01-22 NOTE — Patient Instructions (Signed)
Visit Information  Thank you for taking time to visit with me today. Please don't hesitate to contact me if I can be of assistance to you.   Following are the goals we discussed today:   Goals Addressed   None     Our next appointment is by telephone on 6/20 at 1:00 pm  Please call the care guide team at [phone] if you need to cancel or reschedule your appointment.   If you are experiencing a Mental Health or Behavioral Health Crisis or need someone to talk to, please call the [employer] [facility]: [phone] call [phone]  Patient verbalizes understanding of instructions and care plan provided today and agrees to view in MyChart. Active MyChart status and patient understanding of how to access instructions and care plan via MyChart confirmed with patient.     [name], BSW, CDP Social Worker, Certified Dementia Practitioner [facility] Care Management  Care Coordination [phone]

## 2023-01-22 NOTE — Patient Outreach (Signed)
  Care Coordination   Follow Up Visit Note   01/22/2023 Name: Scott Vasquez MRN: [id number] DOB: 1941-08-04  Scott Vasquez is a 82 y.o. year old male who sees Nafziger, Kandee Keen, NP for primary care. I  spoke with patients spouse to follow up on care coordination needs. Discussed SW was contacted by patients primary care providers office regarding status of home health orders. Scott Vasquez requested SW follow up with her son regarding caregiver resources as he is assisting with navigating this for her. SW attempted to contact Scott Vasquez but was unsuccessful.  What matters to the patients health and wellness today?  Caregiver assistance   SDOH assessments and interventions completed:  No     Care Coordination Interventions:  Yes, provided   Interventions Today    Flowsheet Row Most Recent Value  Chronic Disease   Chronic disease during today's visit Diabetes  General Interventions   General Interventions Discussed/Reviewed General Interventions Reviewed        Follow up plan:  SW will follow up with patients son as planned on 6/20.    Encounter Outcome:  Pt. Visit Completed   Scott Vasquez, BSW, CDP Social Worker, Certified Dementia Practitioner [name] Care Management  Care Coordination [phone]

## 2023-01-28 ENCOUNTER — Telehealth: Payer: Self-pay

## 2023-01-28 NOTE — Patient Outreach (Signed)
  Care Coordination   01/28/2023 Name: Damion Jauch Goshert MRN: [id number] DOB: 07-13-1941   Care Coordination Outreach Attempts:  SW placed an unsuccessful call to the patients son to follow up on care coordination needs. SW left a HIPAA compliant voice message requesting a return call.  Follow Up Plan:  Additional outreach attempts will be made to offer the patient care coordination information and services.   Encounter Outcome:  No Answer   Care Coordination Interventions:  No, not indicated    [name], BSW, CDP Social Worker, Certified Dementia Practitioner [facility] Care Management  Care Coordination [phone]

## 2023-02-02 ENCOUNTER — Telehealth: Payer: Self-pay

## 2023-02-02 NOTE — Patient Outreach (Signed)
  Care Coordination   02/02/2023 Name: Scott Vasquez MRN: [id number] DOB: November 20, 1940   Care Coordination Outreach Attempts:  SW placed a second unsuccessful outbound call to the patients son to follow up on care coordination needs. SW left a HIPAA compliant voice message requesting a return call.  Follow Up Plan:  Additional outreach attempts will be made to offer the patient care coordination information and services.   Encounter Outcome:  No Answer   Care Coordination Interventions:  No, not indicated    [name], BSW, CDP Social Worker, Certified Dementia Practitioner [facility] Care Management  Care Coordination [phone]

## 2023-02-04 ENCOUNTER — Telehealth: Payer: Self-pay | Admitting: Physician Assistant

## 2023-02-04 NOTE — Telephone Encounter (Signed)
Pt wife would like to speak to someone about what stage the patient is in

## 2023-02-05 NOTE — Telephone Encounter (Signed)
Called patients wife and she was happy with Kathy Breach answer

## 2023-02-07 ENCOUNTER — Other Ambulatory Visit: Payer: Self-pay | Admitting: Adult Health

## 2023-02-07 DIAGNOSIS — E785 Hyperlipidemia, unspecified: Secondary | ICD-10-CM

## 2023-02-08 NOTE — Telephone Encounter (Signed)
Scheduled 02/10/23  Scott Vasquez  Care Coordination Care Guide  Direct Dial: [phone]

## 2023-02-10 ENCOUNTER — Telehealth: Payer: Self-pay | Admitting: Physician Assistant

## 2023-02-10 ENCOUNTER — Ambulatory Visit: Payer: Self-pay

## 2023-02-10 NOTE — Patient Instructions (Signed)
Visit Information  Thank you for taking time to visit with me today. Please don't hesitate to contact me if I can be of assistance to you.   Following are the goals we discussed today:  - Continue to interview for private pay caregivers in the home as desired - Contact your primary care provider as needed   If you are experiencing a Mental Health or Behavioral Health Crisis or need someone to talk to, please call the [facility]: [phone] call [phone]  Patient verbalizes understanding of instructions and care plan provided today and agrees to view in MyChart. Active MyChart status and patient understanding of how to access instructions and care plan via MyChart confirmed with patient.     No further follow up required: Please contact me as needed.  [name], BSW, CDP Social Worker, Certified Dementia Practitioner [facility] Care Management  Care Coordination [phone]

## 2023-02-10 NOTE — Patient Outreach (Signed)
  Care Coordination   Follow Up Visit Note   02/10/2023 Name: Kelynn Stavely Shur MRN: [id number] DOB: [birth year]  Lester Imming Gutman is a 82 y.o. year old male who sees Nafziger, Kandee Keen, NP for primary care. I  spoke with patients son Karren Burly by phone to follow up on care coordination needs.  What matters to the patients health and wellness today?  Obtain a caregiver in the home to assist with patient care needs.    Goals Addressed             This Visit's Progress    COMPLETED: Care Coordination Activities       Care Coordination Interventions: Determined patients son Karren Burly has obtained a list of private pay caregivers and is actively looking into hiring a caregiver in the home Discussed Karren Burly plans to apply for Medicaid in hopes of the patient receiving assistance with the cost of a caregiver Reviewed the patient is over the income limit for full Medicaid which covers PCS services Advised if placement is needed, the patient could apply for long term care Medicaid - family declines this option Education provided on [facility] advising the patient could participate in this program by paying a monthly copay - family declines this option as they do not want to switch to the [facility] Primary Care Provider Discussed plan for the son to call SW as needed - education has been provided on all available resources. Family is aware they will have to privately hire a caregiver in the home due to patients health plan not covering custodial services        SDOH assessments and interventions completed:  No     Care Coordination Interventions:  Yes, provided   Interventions Today    Flowsheet Row Most Recent Value  Chronic Disease   Chronic disease during today's visit Other  [Dementia, Caregiver Stress]  General Interventions   General Interventions Discussed/Reviewed General Interventions Reviewed, Level of Care  Level of Care Adult Daycare, Personal Care Services  [Education provided on [facility], discussed private pay caregiver options]        Follow up plan: No further intervention required.   Encounter Outcome:  Pt. Visit Completed   [name], BSW, CDP Social Worker, Certified Dementia Practitioner [facility] Care Management  Care Coordination [phone]

## 2023-02-10 NOTE — Telephone Encounter (Signed)
Pt son dropped off a form that he stated that the pt PCP would like for [place] to review and after completed to call the son Karren Burly at [phone] for a pick-up.  Placed in the providers box.

## 2023-03-11 ENCOUNTER — Telehealth: Payer: Self-pay | Admitting: Adult Health

## 2023-03-11 ENCOUNTER — Ambulatory Visit: Payer: Medicare HMO | Admitting: Family Medicine

## 2023-03-11 NOTE — Telephone Encounter (Signed)
Spouse called to say Pt has been experiencing: Eye irritation - Constant tearing of the eye Pt was offered an appointment Pt was scheduled for this afternoon with Dr. Salomon Fick Spouse called back shortly after and cancelled appt.  Spouse is requesting eye drops Spouse informed Pt should come in to be evaluated Spouse stated they cannot come in

## 2023-03-11 NOTE — Telephone Encounter (Signed)
Patient spouse notified of update  and verbalized understanding. 

## 2023-03-11 NOTE — Telephone Encounter (Signed)
Please advise 

## 2023-03-12 ENCOUNTER — Telehealth: Payer: Self-pay | Admitting: Neurology

## 2023-03-12 DIAGNOSIS — R4701 Aphasia: Secondary | ICD-10-CM | POA: Insufficient documentation

## 2023-03-12 DIAGNOSIS — F03B18 Unspecified dementia, moderate, with other behavioral disturbance: Secondary | ICD-10-CM

## 2023-03-12 DIAGNOSIS — F03A0 Unspecified dementia, mild, without behavioral disturbance, psychotic disturbance, mood disturbance, and anxiety: Secondary | ICD-10-CM

## 2023-03-12 NOTE — Telephone Encounter (Signed)
Pt's wife said started yesterday mouth is twisted, left eye is running. Pt still having symptoms this morning. Pt is conscious and alert, no injuries. Advised Scott Vasquez to take Scott Vasquez to emergency room. Scott Vasquez said she prefer not to go to the emergency room.

## 2023-03-12 NOTE — Telephone Encounter (Signed)
Was able to talk with his wife, yesterday March 11, 2023, he had sudden onset of increased confusion, eyes watering, slurred speech, lasting for few hours, now back to baseline  I advised his wife to videotape the episode, call back office if he has recurrent spells

## 2023-03-14 ENCOUNTER — Observation Stay (HOSPITAL_BASED_OUTPATIENT_CLINIC_OR_DEPARTMENT_OTHER)
Admission: EM | Admit: 2023-03-14 | Discharge: 2023-03-15 | Disposition: A | Discharge status: Home Health | Payer: Medicare HMO | Attending: Internal Medicine | Admitting: Internal Medicine

## 2023-03-14 ENCOUNTER — Emergency Department (HOSPITAL_BASED_OUTPATIENT_CLINIC_OR_DEPARTMENT_OTHER): Payer: Medicare HMO

## 2023-03-14 ENCOUNTER — Encounter (HOSPITAL_BASED_OUTPATIENT_CLINIC_OR_DEPARTMENT_OTHER): Payer: Self-pay | Admitting: Emergency Medicine

## 2023-03-14 ENCOUNTER — Other Ambulatory Visit: Payer: Self-pay

## 2023-03-14 ENCOUNTER — Emergency Department (HOSPITAL_COMMUNITY): Payer: Medicare HMO

## 2023-03-14 DIAGNOSIS — R29818 Other symptoms and signs involving the nervous system: Secondary | ICD-10-CM | POA: Diagnosis not present

## 2023-03-14 DIAGNOSIS — R2689 Other abnormalities of gait and mobility: Secondary | ICD-10-CM | POA: Insufficient documentation

## 2023-03-14 DIAGNOSIS — E785 Hyperlipidemia, unspecified: Secondary | ICD-10-CM | POA: Diagnosis present

## 2023-03-14 DIAGNOSIS — R471 Dysarthria and anarthria: Secondary | ICD-10-CM | POA: Insufficient documentation

## 2023-03-14 DIAGNOSIS — Z79899 Other long term (current) drug therapy: Secondary | ICD-10-CM | POA: Diagnosis not present

## 2023-03-14 DIAGNOSIS — N3000 Acute cystitis without hematuria: Secondary | ICD-10-CM | POA: Insufficient documentation

## 2023-03-14 DIAGNOSIS — E119 Type 2 diabetes mellitus without complications: Secondary | ICD-10-CM | POA: Diagnosis not present

## 2023-03-14 DIAGNOSIS — Z8679 Personal history of other diseases of the circulatory system: Secondary | ICD-10-CM | POA: Diagnosis present

## 2023-03-14 DIAGNOSIS — F03B18 Unspecified dementia, moderate, with other behavioral disturbance: Secondary | ICD-10-CM | POA: Diagnosis present

## 2023-03-14 DIAGNOSIS — R29810 Facial weakness: Secondary | ICD-10-CM

## 2023-03-14 DIAGNOSIS — I639 Cerebral infarction, unspecified: Secondary | ICD-10-CM | POA: Diagnosis not present

## 2023-03-14 DIAGNOSIS — E1159 Type 2 diabetes mellitus with other circulatory complications: Secondary | ICD-10-CM

## 2023-03-14 DIAGNOSIS — Z87891 Personal history of nicotine dependence: Secondary | ICD-10-CM | POA: Diagnosis not present

## 2023-03-14 DIAGNOSIS — Z7982 Long term (current) use of aspirin: Secondary | ICD-10-CM | POA: Insufficient documentation

## 2023-03-14 DIAGNOSIS — R2681 Unsteadiness on feet: Secondary | ICD-10-CM | POA: Diagnosis not present

## 2023-03-14 DIAGNOSIS — R41841 Cognitive communication deficit: Secondary | ICD-10-CM | POA: Insufficient documentation

## 2023-03-14 DIAGNOSIS — G319 Degenerative disease of nervous system, unspecified: Secondary | ICD-10-CM | POA: Diagnosis not present

## 2023-03-14 DIAGNOSIS — E1122 Type 2 diabetes mellitus with diabetic chronic kidney disease: Secondary | ICD-10-CM

## 2023-03-14 DIAGNOSIS — I6782 Cerebral ischemia: Secondary | ICD-10-CM | POA: Diagnosis not present

## 2023-03-14 DIAGNOSIS — I10 Essential (primary) hypertension: Secondary | ICD-10-CM | POA: Diagnosis not present

## 2023-03-14 DIAGNOSIS — F03A0 Unspecified dementia, mild, without behavioral disturbance, psychotic disturbance, mood disturbance, and anxiety: Secondary | ICD-10-CM | POA: Diagnosis present

## 2023-03-14 DIAGNOSIS — R531 Weakness: Secondary | ICD-10-CM | POA: Diagnosis present

## 2023-03-14 LAB — CBC
HCT: 31.8 % — ABNORMAL LOW (ref 39.0–52.0)
Hemoglobin: 10.1 g/dL — ABNORMAL LOW (ref 13.0–17.0)
MCH: 29.1 pg (ref 26.0–34.0)
MCHC: 31.8 g/dL (ref 30.0–36.0)
MCV: 91.6 fL (ref 80.0–100.0)
Platelets: 192 10*3/uL (ref 150–400)
RBC: 3.47 MIL/uL — ABNORMAL LOW (ref 4.22–5.81)
RDW: 13.6 % (ref 11.5–15.5)
WBC: 6.1 10*3/uL (ref 4.0–10.5)
nRBC: 0 % (ref 0.0–0.2)

## 2023-03-14 LAB — URINALYSIS, W/ REFLEX TO CULTURE (INFECTION SUSPECTED)
Bilirubin Urine: NEGATIVE
Glucose, UA: NEGATIVE mg/dL
Hgb urine dipstick: NEGATIVE
Ketones, ur: NEGATIVE mg/dL
Nitrite: NEGATIVE
Protein, ur: NEGATIVE mg/dL
Specific Gravity, Urine: 1.016 (ref 1.005–1.030)
pH: 6 (ref 5.0–8.0)

## 2023-03-14 LAB — BASIC METABOLIC PANEL
Anion gap: 6 (ref 5–15)
BUN: 13 mg/dL (ref 8–23)
CO2: 30 mmol/L (ref 22–32)
Calcium: 8.9 mg/dL (ref 8.9–10.3)
Chloride: 104 mmol/L (ref 98–111)
Creatinine, Ser: 1.32 mg/dL — ABNORMAL HIGH (ref 0.61–1.24)
GFR, Estimated: 54 mL/min — ABNORMAL LOW (ref >60)
Glucose, Bld: 106 mg/dL — ABNORMAL HIGH (ref 70–99)
Potassium: 3.9 mmol/L (ref 3.5–5.1)
Sodium: 140 mmol/L (ref 135–145)

## 2023-03-14 LAB — CBG MONITORING, ED
Glucose-Capillary: 104 mg/dL — ABNORMAL HIGH (ref 70–99)
Glucose-Capillary: 51 mg/dL — ABNORMAL LOW (ref 70–99)
Glucose-Capillary: 64 mg/dL — ABNORMAL LOW (ref 70–99)
Glucose-Capillary: 81 mg/dL (ref 70–99)

## 2023-03-14 MED ORDER — LISINOPRIL 20 MG PO TABS
40.0000 mg | ORAL_TABLET | Freq: Once | ORAL | Status: AC
  Administered 2023-03-14: 40 mg via ORAL
  Filled 2023-03-14: qty 2

## 2023-03-14 NOTE — ED Notes (Signed)
Pt given 8 oz of orange juice for CBG 51

## 2023-03-14 NOTE — ED Notes (Signed)
Dr. Rubin Payor at bedside for evaluation.

## 2023-03-14 NOTE — ED Triage Notes (Signed)
Weakness since Friday "mouth twisting" on and off   "mouth twisting" ( facial droop) now Face normal at home. 4pm

## 2023-03-14 NOTE — ED Provider Notes (Signed)
[name] EMERGENCY DEPARTMENT AT [facility] Provider Note   CSN: [id number] Arrival date & time: 03/14/23  1617     History  Chief Complaint  Patient presents with   Weakness    Scott Vasquez is a 82 y.o. male.   Weakness Patient presents with facial droop.  Reportedly had some generalized weakness also.  Had episode starting on Friday.  Had the facial droop.  Discussed with patient's wife and daughter.  Patient's daughter stated that earlier today he had an episode where the right side of face was drooping but is primarily on the left side.  Reportedly deficits had resolved but then started again at 4:00 today.  Has had previous stroke.  Family cannot say what the deficits were but appears that it was numbness in the left hand by notes.  Also has a history of moderate dementia.    Past Medical History:  Diagnosis Date   Anemia    iron deficiency   Diabetes mellitus    type II   Diverticulosis of colon    Hypertension    Stroke (HCC)    numbness in left hand   Thyroid nodule     Home Medications Prior to Admission medications   Medication Sig Start Date End Date Taking? Authorizing Provider  ACCU-CHEK AVIVA PLUS test strip TEST BLOOD SUGAR TWICE DAILY 03/17/22   [name], NP  aspirin 81 MG chewable tablet Chew 1 tablet (81 mg total) by mouth daily. 01/04/22   Tyrone Nine, MD  atorvastatin (LIPITOR) 80 MG tablet TAKE 1 TABLET EVERY DAY 02/10/23   Nafziger, Kandee Keen, NP  Blood Glucose Monitoring Suppl (ACCU-CHEK GUIDE) w/Device KIT USE AS DIRECTED 03/17/22   Nafziger, Kandee Keen, NP  citalopram (CELEXA) 20 MG tablet TAKE 1 TABLET EVERY DAY 09/28/22   Gwynneth Munson, Sung Amabile, PA-C  cyanocobalamin 1000 MCG tablet Take 1,000 mcg by mouth daily.    [provider]  donepezil (ARICEPT) 10 MG tablet TAKE 1 TABLET AT BEDTIME 09/28/22   Wertman, Sung Amabile, PA-C  Dulaglutide (TRULICITY) 1.5 MG/0.5ML SOPN Inject 1.5 mg into the skin once a week. 12/31/22 03/31/23  Nafziger, Kandee Keen, NP   glipiZIDE (GLUCOTROL XL) 10 MG 24 hr tablet Take 1 tablet (10 mg total) by mouth daily with breakfast. 12/31/22   Nafziger, Kandee Keen, NP  insulin glargine (LANTUS SOLOSTAR) 100 UNIT/ML Solostar Pen Inject 10 Units into the skin daily. 12/31/22 03/31/23  Nafziger, Kandee Keen, NP  Insulin Pen Needle (PEN NEEDLES) 31G X 5 MM MISC Use with insulin pen 12/31/22   Nafziger, Kandee Keen, NP  levothyroxine (SYNTHROID) 50 MCG tablet TAKE 1 TABLET EVERY DAY 09/24/22   Nafziger, Kandee Keen, NP  lisinopril (ZESTRIL) 40 MG tablet TAKE 1 TABLET EVERY DAY 09/24/22   Nafziger, Kandee Keen, NP  memantine (NAMENDA) 10 MG tablet TAKE 1 TABLET TWICE DAILY 09/28/22   Gwynneth Munson, Sung Amabile, PA-C  Vibegron (GEMTESA) 75 MG TABS Take 75 mg by mouth daily.    [provider]      Allergies    Actos [pioglitazone] and Metformin and related    Review of Systems   Review of Systems  Neurological:  Positive for weakness.    Physical Exam Updated Vital Signs BP (!) 167/84   Pulse (!) 56   Temp 98.2 F (36.8 C)   Resp 20   SpO2 100%  Physical Exam Vitals and nursing note reviewed.  Eyes:     Extraocular Movements: Extraocular movements intact.  Neurological:     Mental Status:  He is alert.     Comments: Awake and reported mental baseline.  Does have left-sided facial droop.  Forehead is involved.  Left eyelid does not close completely.  Good strength in upper and lower extremities.     ED Results / Procedures / Treatments   Labs (all labs ordered are listed, but only abnormal results are displayed) Labs Reviewed  BASIC METABOLIC PANEL - Abnormal; Notable for the following components:      Result Value   Glucose, Bld 106 (*)    Creatinine, Ser 1.32 (*)    GFR, Estimated 54 (*)    All other components within normal limits  CBC - Abnormal; Notable for the following components:   RBC 3.47 (*)    Hemoglobin 10.1 (*)    HCT 31.8 (*)    All other components within normal limits  URINALYSIS, W/ REFLEX TO CULTURE (INFECTION SUSPECTED) -  Abnormal; Notable for the following components:   Leukocytes,Ua MODERATE (*)    Bacteria, UA RARE (*)    All other components within normal limits  CBG MONITORING, ED - Abnormal; Notable for the following components:   Glucose-Capillary 104 (*)    All other components within normal limits  URINE CULTURE    EKG None  Radiology CT Head Wo Contrast  Result Date: 03/14/2023 CLINICAL DATA:  Neuro deficit, acute, stroke suspected EXAM: CT HEAD WITHOUT CONTRAST TECHNIQUE: Contiguous axial images were obtained from the base of the skull through the vertex without intravenous contrast. RADIATION DOSE REDUCTION: This exam was performed according to the departmental dose-optimization program which includes automated exposure control, adjustment of the mA and/or kV according to patient size and/or use of iterative reconstruction technique. COMPARISON:  Brain MR 01/01/2022 FINDINGS: Brain: No evidence of acute infarction, hemorrhage, hydrocephalus, extra-axial collection or mass lesion/mass effect. There is sequela of severe chronic microvascular ischemic change. 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 Vascular: No hyperdense vessel or unexpected calcification. Skull: Normal. Negative for fracture or focal lesion. Sinuses/Orbits: No middle ear or mastoid effusion. Paranasal sinuses are clear. Bilateral lens replacement. Orbits are otherwise unremarkable. Other: None. IMPRESSION: 1. No hemorrhage or CT evidence of an acute cortical infarct. 2. Sequela of severe chronic microvascular ischemic change. 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 Electronically Signed   By: Lorenza Cambridge M.D.   On: 03/14/2023 17:08    Procedures Procedures    Medications Ordered in ED Medications - No data to display  ED Course/ Medical Decision Making/ A&P                                 Medical Decision Making Amount and/or Complexity of Data Reviewed Labs: ordered. Radiology: ordered.   Patient  with facial droop.  Potentially Bell's palsy and that the forehead is involved, however has been waxing and waning reportedly over the last 3 days.  Question of whether deficits had completely resolved.  I discussed with Dr. Selina Cooley from radiology who did not see the patient but we do not think it is a code stroke at this time.  Deficits are mild and has questionable return to baseline with symptoms from 2 days ago.  However head CT done and reassuring.  Will get basic blood work but will require MRI for further evaluation.  MRI not available at this facility and required transfer to [facility].  Blood work overall reassuring.  Urine shows likely infection.  Cultures been sent.  Will require treatment.  Facial droop is Bell's palsy versus potential stroke with the variability of the deficits.  Other causes such as myasthenia gravis felt less likely.  Will transfer to [facility] for MRI with likely discharged with antibiotics and treatment for Bell's palsy along with neurology follow-up.  Dr. Anitra Lauth aware of transfer.        Final Clinical Impression(s) / ED Diagnoses Final diagnoses:  Acute cystitis without hematuria  Facial droop    Rx / DC Orders ED Discharge Orders     None         Benjiman Core, MD 03/14/23 1911

## 2023-03-14 NOTE — Discharge Instructions (Signed)
Go to the [facility], ER.  They we will do the MRI there.

## 2023-03-15 ENCOUNTER — Telehealth: Payer: Self-pay | Admitting: Physician Assistant

## 2023-03-15 ENCOUNTER — Observation Stay (HOSPITAL_COMMUNITY): Payer: Medicare HMO

## 2023-03-15 ENCOUNTER — Other Ambulatory Visit: Payer: Self-pay | Admitting: Internal Medicine

## 2023-03-15 ENCOUNTER — Observation Stay (HOSPITAL_BASED_OUTPATIENT_CLINIC_OR_DEPARTMENT_OTHER): Payer: Medicare HMO

## 2023-03-15 DIAGNOSIS — R29810 Facial weakness: Secondary | ICD-10-CM

## 2023-03-15 DIAGNOSIS — F01A0 Vascular dementia, mild, without behavioral disturbance, psychotic disturbance, mood disturbance, and anxiety: Secondary | ICD-10-CM

## 2023-03-15 DIAGNOSIS — I6621 Occlusion and stenosis of right posterior cerebral artery: Secondary | ICD-10-CM | POA: Diagnosis not present

## 2023-03-15 DIAGNOSIS — E1159 Type 2 diabetes mellitus with other circulatory complications: Secondary | ICD-10-CM | POA: Diagnosis not present

## 2023-03-15 DIAGNOSIS — I6381 Other cerebral infarction due to occlusion or stenosis of small artery: Secondary | ICD-10-CM

## 2023-03-15 DIAGNOSIS — I10 Essential (primary) hypertension: Secondary | ICD-10-CM

## 2023-03-15 DIAGNOSIS — I6389 Other cerebral infarction: Secondary | ICD-10-CM | POA: Diagnosis not present

## 2023-03-15 DIAGNOSIS — I639 Cerebral infarction, unspecified: Principal | ICD-10-CM | POA: Diagnosis present

## 2023-03-15 DIAGNOSIS — E785 Hyperlipidemia, unspecified: Secondary | ICD-10-CM

## 2023-03-15 DIAGNOSIS — G510 Bell's palsy: Secondary | ICD-10-CM

## 2023-03-15 DIAGNOSIS — E890 Postprocedural hypothyroidism: Secondary | ICD-10-CM

## 2023-03-15 DIAGNOSIS — G319 Degenerative disease of nervous system, unspecified: Secondary | ICD-10-CM | POA: Diagnosis not present

## 2023-03-15 LAB — GLUCOSE, CAPILLARY
Glucose-Capillary: 97 mg/dL (ref 70–99)
Glucose-Capillary: 198 mg/dL — ABNORMAL HIGH (ref 70–99)
Glucose-Capillary: 291 mg/dL — ABNORMAL HIGH (ref 70–99)

## 2023-03-15 LAB — LIPID PANEL
Cholesterol: 107 mg/dL (ref 0–200)
HDL: 40 mg/dL — ABNORMAL LOW (ref >40)
LDL Cholesterol: 55 mg/dL (ref 0–99)
Total CHOL/HDL Ratio: 2.7 RATIO
Triglycerides: 61 mg/dL (ref <150)
VLDL: 12 mg/dL (ref 0–40)

## 2023-03-15 LAB — ECHOCARDIOGRAM COMPLETE
Area-P 1/2: 2.39 cm2
Height: 70 in
P 1/2 time: 564 msec
S' Lateral: 2.8 cm
Weight: 2991.2 oz

## 2023-03-15 LAB — CBG MONITORING, ED: Glucose-Capillary: 96 mg/dL (ref 70–99)

## 2023-03-15 MED ORDER — ATORVASTATIN CALCIUM 80 MG PO TABS
80.0000 mg | ORAL_TABLET | Freq: Every day | ORAL | Status: DC
  Administered 2023-03-15: 80 mg via ORAL
  Filled 2023-03-15: qty 1

## 2023-03-15 MED ORDER — INSULIN ASPART 100 UNIT/ML IJ SOLN
0.0000 [IU] | Freq: Three times a day (TID) | INTRAMUSCULAR | Status: DC
  Administered 2023-03-15: 8 [IU] via SUBCUTANEOUS
  Administered 2023-03-15: 3 [IU] via SUBCUTANEOUS

## 2023-03-15 MED ORDER — CLOPIDOGREL BISULFATE 75 MG PO TABS: 75.0000 mg | ORAL_TABLET | Freq: Every day | ORAL | 0 refills | Status: DC

## 2023-03-15 MED ORDER — PANTOPRAZOLE SODIUM 40 MG PO TBEC: 40.0000 mg | DELAYED_RELEASE_TABLET | Freq: Every day | ORAL | 0 refills | Status: DC

## 2023-03-15 MED ORDER — STROKE: EARLY STAGES OF RECOVERY BOOK: Freq: Once | Status: DC

## 2023-03-15 MED ORDER — PREDNISONE 20 MG PO TABS: 60.0000 mg | ORAL_TABLET | Freq: Every day | ORAL | 0 refills | Status: DC

## 2023-03-15 MED ORDER — INSULIN GLARGINE-YFGN 100 UNIT/ML ~~LOC~~ SOLN
10.0000 [IU] | Freq: Every day | SUBCUTANEOUS | Status: DC
  Filled 2023-03-15 (×2): qty 0.1

## 2023-03-15 MED ORDER — DONEPEZIL HCL 10 MG PO TABS: 10.0000 mg | ORAL_TABLET | Freq: Every day | ORAL | Status: DC

## 2023-03-15 MED ORDER — LEVOTHYROXINE SODIUM 50 MCG PO TABS
50.0000 ug | ORAL_TABLET | Freq: Every day | ORAL | Status: DC
  Administered 2023-03-15: 50 ug via ORAL
  Filled 2023-03-15: qty 1

## 2023-03-15 MED ORDER — CLOPIDOGREL BISULFATE 75 MG PO TABS
75.0000 mg | ORAL_TABLET | Freq: Every day | ORAL | Status: DC
  Administered 2023-03-15: 75 mg via ORAL
  Filled 2023-03-15: qty 1

## 2023-03-15 MED ORDER — CITALOPRAM HYDROBROMIDE 10 MG PO TABS
20.0000 mg | ORAL_TABLET | Freq: Every day | ORAL | Status: DC
  Administered 2023-03-15: 20 mg via ORAL
  Filled 2023-03-15: qty 2

## 2023-03-15 MED ORDER — MIRABEGRON ER 25 MG PO TB24
25.0000 mg | ORAL_TABLET | Freq: Every day | ORAL | Status: DC
  Administered 2023-03-15: 25 mg via ORAL
  Filled 2023-03-15: qty 1

## 2023-03-15 MED ORDER — VALACYCLOVIR HCL 500 MG PO TABS
1000.0000 mg | ORAL_TABLET | Freq: Three times a day (TID) | ORAL | Status: DC
  Administered 2023-03-15 (×2): 1000 mg via ORAL
  Filled 2023-03-15 (×3): qty 2

## 2023-03-15 MED ORDER — ACETAMINOPHEN 160 MG/5ML PO SOLN: 650.0000 mg | ORAL | Status: DC | PRN

## 2023-03-15 MED ORDER — AMLODIPINE BESYLATE 10 MG PO TABS: 10.0000 mg | ORAL_TABLET | Freq: Every day | ORAL | 2 refills | Status: DC

## 2023-03-15 MED ORDER — POLYVINYL ALCOHOL 1.4 % OP SOLN: 1.0000 [drp] | OPHTHALMIC | 0 refills | Status: DC | PRN

## 2023-03-15 MED ORDER — ENOXAPARIN SODIUM 40 MG/0.4ML IJ SOSY
40.0000 mg | PREFILLED_SYRINGE | INTRAMUSCULAR | Status: DC
  Administered 2023-03-15: 40 mg via SUBCUTANEOUS
  Filled 2023-03-15: qty 0.4

## 2023-03-15 MED ORDER — PREDNISONE 20 MG PO TABS: 60.0000 mg | ORAL_TABLET | Freq: Every day | ORAL | 0 refills | Status: AC

## 2023-03-15 MED ORDER — AMLODIPINE BESYLATE 10 MG PO TABS
10.0000 mg | ORAL_TABLET | Freq: Every day | ORAL | Status: DC
  Administered 2023-03-15: 10 mg via ORAL
  Filled 2023-03-15: qty 1

## 2023-03-15 MED ORDER — ASPIRIN 81 MG PO TBEC
81.0000 mg | DELAYED_RELEASE_TABLET | Freq: Every day | ORAL | Status: DC
  Administered 2023-03-15: 81 mg via ORAL
  Filled 2023-03-15: qty 1

## 2023-03-15 MED ORDER — CLOPIDOGREL BISULFATE 75 MG PO TABS: 75.0000 mg | ORAL_TABLET | Freq: Every day | ORAL | 0 refills | Status: AC

## 2023-03-15 MED ORDER — POLYVINYL ALCOHOL 1.4 % OP SOLN: 1.0000 [drp] | OPHTHALMIC | Status: DC | PRN

## 2023-03-15 MED ORDER — ACETAMINOPHEN 650 MG RE SUPP: 650.0000 mg | RECTAL | Status: DC | PRN

## 2023-03-15 MED ORDER — VALACYCLOVIR HCL 1 G PO TABS: 1000.0000 mg | ORAL_TABLET | Freq: Three times a day (TID) | ORAL | 0 refills | Status: DC

## 2023-03-15 MED ORDER — ARTIFICIAL TEARS OPHTHALMIC OINT: TOPICAL_OINTMENT | Freq: Every evening | OPHTHALMIC | Status: DC | PRN

## 2023-03-15 MED ORDER — ACETAMINOPHEN 325 MG PO TABS: 650.0000 mg | ORAL_TABLET | ORAL | Status: DC | PRN

## 2023-03-15 MED ORDER — IOHEXOL 350 MG/ML SOLN
75.0000 mL | Freq: Once | INTRAVENOUS | Status: AC | PRN
  Administered 2023-03-15: 75 mL via INTRAVENOUS

## 2023-03-15 MED ORDER — PREDNISONE 20 MG PO TABS
60.0000 mg | ORAL_TABLET | Freq: Every day | ORAL | Status: DC
  Administered 2023-03-15: 60 mg via ORAL
  Filled 2023-03-15: qty 3

## 2023-03-15 MED ORDER — MEMANTINE HCL 10 MG PO TABS
10.0000 mg | ORAL_TABLET | Freq: Two times a day (BID) | ORAL | Status: DC
  Administered 2023-03-15: 10 mg via ORAL
  Filled 2023-03-15: qty 1

## 2023-03-15 MED ORDER — VALACYCLOVIR HCL 1 G PO TABS: 1000.0000 mg | ORAL_TABLET | Freq: Three times a day (TID) | ORAL | 0 refills | Status: AC

## 2023-03-15 NOTE — Assessment & Plan Note (Signed)
Hold home BP meds and allow permissive HTN in setting of acute stroke. Treat if SBP > 220 or DBP > 120

## 2023-03-15 NOTE — ED Provider Notes (Signed)
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  Patient states that this started on Friday, has remained constant, no associated change in vision, worsening paresthesias or weakness in the upper and or lower extremities, no recent head trauma.  Denies any fevers chills cough congestion denies any chest pain shortness of breath swelling pains nausea vomiting denies any urinary symptoms.  Wife is at bedside able evaluated story, she does not know any new weakness or new gait abnormalities.   Physical Exam  BP (!) 183/90 (BP Location: Left Arm)   Pulse (!) 59   Temp 98.3 F (36.8 C) (Oral)   Resp 18   Ht 5\' 10"  (1.778 m)   Wt 84.8 kg   SpO2 100%   BMI 26.82 kg/m   Physical Exam Vitals and nursing note reviewed.  Constitutional:      General: He is not in acute distress.    Appearance: He is not ill-appearing.  HENT:     Head: Normocephalic and atraumatic.     Nose: No congestion.  Eyes:     Conjunctiva/sclera: Conjunctivae normal.  Cardiovascular:     Rate and Rhythm: Normal rate and regular rhythm.     Pulses: Normal pulses.     Heart sounds: No murmur heard.    No friction rub. No gallop.  Pulmonary:     Effort: No respiratory distress.     Breath sounds: No wheezing, rhonchi or rales.  Skin:    General: Skin is warm and dry.  Neurological:     Mental Status: He is alert.     GCS: GCS eye subscore is 4. GCS verbal subscore is 5. GCS motor subscore is 6.     Cranial Nerves: Cranial nerve deficit and facial asymmetry present.     Motor: Weakness present.     Coordination: Romberg sign negative. Finger-Nose-Finger Test normal.     Comments: Patient is noted left-sided facial droop, affecting the left eyebrow, left lower lip, EOMs intact, PERRLA, no slurred speech, able to  follow two-step commands, there is minimal left hand weakness but this appears to be at his baseline, no other focal deficits noted on my exam.    Psychiatric:        Mood and Affect: Mood normal.     Procedures  Procedures  ED Course / MDM    Medical Decision Making Amount and/or Complexity of Data Reviewed Labs: ordered. Radiology: ordered.  Risk Prescription drug management. Decision regarding hospitalization.    Lab Tests:  I Ordered, and personally interpreted labs.  The pertinent results include: CBC shows normocytic anemia hemoglobin 10.1, BMP reveals a glucose of 106, creatinine 1.32, GFR 54, UA shows moderate leukocytes, white blood cells rare bacteria,   Imaging Studies ordered:  I ordered imaging studies including CT head, MRI brain, CT angio head and neck I independently visualized and interpreted imaging which showed CT head negative for acute infarct, MRI reveals 5 mm acute ischemic nonhemorrhagic infarct involving the left frontal corona, subacute infarct of the right basal ganglia, CTA head and neck were unremarkable I agree with the radiologist interpretation   Cardiac Monitoring:  The patient was maintained on a cardiac monitor.  I personally viewed and interpreted the cardiac monitored which showed an underlying rhythm of: EKG without signs of ischemia   Medicines ordered and prescription drug management:  I ordered medication including N/A I have reviewed the patients home medicines and have made adjustments as needed  Critical Interventions:  N/A   Reevaluation:  Presents with facial droop, will send down for MRI for further assessment  MRI positive for acute stroke, will consult with neurology for further recommendations  Update patient on recommendations and he is in agreement with admission at this time    Consultations Obtained:  I requested consultation with the Dr. Derry Lory neurology,  and discussed lab and imaging findings  as well as pertinent plan - they recommend: He recommends hospital admission Spoke with Dr. Julian Reil he will admit the patient.    Test Considered:  N/A  Dispostion and problem list  After consideration of the diagnostic results and the patients response to treatment, I feel that the patent would benefit from admission.  Acute stroke-patient needs stroke workup, medication management and formal evaluation by neurology.           Carroll Sage, PA-C 03/15/23 [id number]    Maia Plan, MD 03/18/23 [phone]

## 2023-03-15 NOTE — Assessment & Plan Note (Addendum)
Pt with puncatate acute stroke to L corona radiata and looks like a subacute stroke? To R basal ganglia. L facial weakness may be related to R stroke?, or may be due to a Bell's palsy unrelated to stroke(s)? Involvement of forehead usually would suggest Bell's palsy.  And the acute L corona radiata stroke is on the wrong hemisphere to be causing a L sided weakness symptoms... -Neuro looking into this for explanation. Regardless, acute stroke(s) demonstrated on MRI in patient with risk factors including multiple prior strokes: so needs admit, stroke work up, risk factor modification, etc. Stroke pathway Tele monitor 2d echo CTA head and neck PT/OT/SLP Neuro consulting

## 2023-03-15 NOTE — Evaluation (Signed)
Physical Therapy Evaluation  Patient Details Name: Scott Vasquez MRN: [id number] DOB: Feb 28, 1941 Today's Date: 03/15/2023  History of Present Illness  Pt is an 82 y/o male who presents 03/14/2023 with facial droop and generalized weakness that resolved and returned later the same day. 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 PMH significant for prior stroke, DM, HTN, back surgery, shoulder surgery, thyroid lobectomy.   Clinical Impression  Pt admitted with above diagnosis. Pt currently with functional limitations due to the deficits listed below (see PT Problem List). At the time of PT eval pt was able to perform transfers and ambulation with gross min guard assist to min assist with RW for support. Pt initially with SPC but reports feeling more comfortable with the RW. Pt has a rollator at home. Feel this will be sufficient and pt will not require a separate rolling walker. Grandson present throughout session and reports that pt has 24/7 support at home. Acutely, pt will benefit from acute skilled PT to increase their independence and safety with mobility to allow discharge.           If plan is discharge home, recommend the following: A little help with walking and/or transfers;A little help with bathing/dressing/bathroom;Assistance with cooking/housework;Assist for transportation;Help with stairs or ramp for entrance   Can travel by private vehicle        Equipment Recommendations None recommended by PT  Recommendations for Other Services       Functional Status Assessment Patient has had a recent decline in their functional status and demonstrates the ability to make significant improvements in function in a reasonable and predictable amount of time.     Precautions / Restrictions Precautions Precautions: Fall Restrictions Weight Bearing Restrictions: No       Mobility  Bed Mobility Overal bed mobility: Needs Assistance Bed Mobility: Supine to Sit     Supine to sit: Supervision, HOB elevated          Transfers Overall transfer level: Needs assistance Equipment used: Straight cane, Rolling walker (2 wheels) Transfers: Sit to/from Stand Sit to Stand: Min assist           General transfer comment: Min assist approaching min guard assist with power up to full stand. Initially with cane, and then with RW.    Ambulation/Gait Ambulation/Gait assistance: Min guard Gait Distance (Feet): 150 Feet Assistive device: Rolling walker (2 wheels) Gait Pattern/deviations: Step-through pattern, Decreased stride length, Trunk flexed Gait velocity: Decreased Gait velocity interpretation: <1.31 ft/sec, indicative of household ambulator   General Gait Details: Slow and guarded with RW for support. Noted R scuffing of shoe with swing through. Able to improve some with cues but as pt fatigued, scuffing became more prominent again.  Stairs            Wheelchair Mobility     Tilt Bed    Modified Rankin (Stroke Patients Only)       Balance Overall balance assessment: Needs assistance Sitting-balance support: No upper extremity supported, Feet supported Sitting balance-Leahy Scale: Fair     Standing balance support: Bilateral upper extremity supported, Single extremity supported, During functional activity Standing balance-Leahy Scale: Poor                               Pertinent Vitals/Pain Pain Assessment Pain Assessment: No/denies pain  Home Living Family/patient expects to be discharged to:: Private residence Living Arrangements: Spouse/significant other;Children Available Help at Discharge: Family;Available 24 hours/day Type of Home: House Home Access: Stairs to enter Entrance Stairs-Rails: Doctor, general practice of Steps: 2   Home Layout: Two level;Able to live on main level with  bedroom/bathroom Home Equipment: Cane - single point;BSC/3in1;Rollator (4 wheels) Additional Comments: looking into getting a shower chair. two adult children rotate to provide 24/7 assist    Prior Function Prior Level of Function : Independent/Modified Independent             Mobility Comments: cane for mobility ADLs Comments: Mod I for ADLs, family assist with the IADLs     Hand Dominance   Dominant Hand: Right    Extremity/Trunk Assessment   Upper Extremity Assessment Upper Extremity Assessment: Defer to OT evaluation    Lower Extremity Assessment Lower Extremity Assessment: RLE deficits/detail;LLE deficits/detail RLE Deficits / Details: Good strength with MMT however coordination is slow during functional mobility. R worse than L grossly.    Cervical / Trunk Assessment Cervical / Trunk Assessment: Other exceptions Cervical / Trunk Exceptions: Flexed trunk, forward head posture with rounded shoulders  Communication   Communication: Expressive difficulties  Cognition Arousal/Alertness: Awake/alert Behavior During Therapy: Flat affect Overall Cognitive Status: History of cognitive impairments - at baseline                                 General Comments: hx of dementia, follows one step commands consistently, flat affect, memory deficits. per grandson present, appears close to baseline        General Comments General comments (skin integrity, edema, etc.): Grandson present    Exercises     Assessment/Plan    PT Assessment Patient needs continued PT services  PT Problem List Decreased strength;Decreased activity tolerance;Decreased balance;Decreased mobility;Decreased safety awareness;Decreased cognition;Decreased coordination       PT Treatment Interventions DME instruction;Gait training;Functional mobility training;Stair training;Therapeutic activities;Therapeutic exercise;Balance training;Patient/family education    PT Goals (Current goals  can be found in the Care Plan section)  Acute Rehab PT Goals Patient Stated Goal: Return home PT Goal Formulation: With patient/family Time For Goal Achievement: 03/22/23 Potential to Achieve Goals: Good    Frequency Min 1X/week     Co-evaluation               AM-PAC PT "6 Clicks" Mobility  Outcome Measure Help needed turning from your back to your side while in a flat bed without using bedrails?: A Little Help needed moving from lying on your back to sitting on the side of a flat bed without using bedrails?: A Little Help needed moving to and from a bed to a chair (including a wheelchair)?: A Little Help needed standing up from a chair using your arms (e.g., wheelchair or bedside chair)?: A Little Help needed to walk in hospital room?: A Little Help needed climbing 3-5 steps with a railing? : A Little 6 Click Score: 18    End of Session Equipment Utilized During Treatment: Gait belt Activity Tolerance: Patient tolerated treatment well Patient left: in chair;with call bell/phone within reach;with chair alarm set;with family/visitor present Nurse Communication: Mobility status PT Visit Diagnosis: Other symptoms and signs involving the nervous system (R29.898);Unsteadiness on feet (R26.81)    Time: 9562-1308 PT Time Calculation (min) (ACUTE ONLY): 22 min   Charges:   PT Evaluation $PT Eval Low Complexity: 1 Low   PT  General Charges $$ ACUTE PT VISIT: 1 Visit         Conni Slipper, PT, DPT Acute Rehabilitation Services Secure Chat Preferred Office: [phone]   Marylynn Pearson 03/15/2023, 2:27 PM

## 2023-03-15 NOTE — Assessment & Plan Note (Addendum)
Cont Statin Check lipid panel ? If addition of PCSK9 might help prevent ongoing strokes?

## 2023-03-15 NOTE — Progress Notes (Addendum)
STROKE TEAM PROGRESS NOTE   BRIEF HPI Scott Vasquez is a 82 y.o. male with PMHx significant for HTN, T2DM, and HLD who presents with about 2 day history of L facial droop and inability to close L eyelid.    SIGNIFICANT HOSPITAL EVENTS 8/4 presented to ED 8/5 Admitted to hosp  INTERIM HISTORY/SUBJECTIVE Patient has moderate dementia at baseline and cannot provide any history; family member member provided history. Family member reports that the patient has a 2d history of the facial droop and inability to close left eye. Reported that the patient went to his PCP due to issues with his eye. Given that the patient was also experiencing "twisting of the mouth" (?facial droop?), PCP recommended the patient go to the ED. Family member reports that the patient had a stroke one year ago during which the patient had some motor deficit of the arm/legs (could not provide details). He also reports that the patient has dementia but cannot provide a timeline.    OBJECTIVE  CBC    Component Value Date/Time   WBC 6.1 03/14/2023 1654   RBC 3.47 (L) 03/14/2023 1654   HGB 10.1 (L) 03/14/2023 1654   HCT 31.8 (L) 03/14/2023 1654   PLT 192 03/14/2023 1654   MCV 91.6 03/14/2023 1654   MCH 29.1 03/14/2023 1654   MCHC 31.8 03/14/2023 1654   RDW 13.6 03/14/2023 1654   LYMPHSABS 1.0 01/16/2022 1436   MONOABS 0.4 01/16/2022 1436   EOSABS 0.3 01/16/2022 1436   BASOSABS 0.0 01/16/2022 1436    BMET    Component Value Date/Time   NA 140 03/14/2023 1654   K 3.9 03/14/2023 1654   CL 104 03/14/2023 1654   CO2 30 03/14/2023 1654   GLUCOSE 106 (H) 03/14/2023 1654   GLUCOSE 171 (H) 07/16/2006 1037   BUN 13 03/14/2023 1654   CREATININE 1.32 (H) 03/14/2023 1654   CREATININE 1.52 (H) 07/17/2020 1025   CALCIUM 8.9 03/14/2023 1654   GFRNONAA 54 (L) 03/14/2023 1654   GFRNONAA 42 (L) 07/17/2020 1025    IMAGING past 24 hours ECHOCARDIOGRAM COMPLETE  Result Date: 03/15/2023    ECHOCARDIOGRAM REPORT   Patient  Name:   Scott Vasquez Date of Exam: 03/15/2023 Medical Rec #:  [id number]    Height:       70.0 in Accession #:    [id number]   Weight:       186.9 lb Date of Birth:  09/15/1940    BSA:          2.028 m Patient Age:    81 years     BP:           139/79 mmHg Patient Gender: M            HR:           63 bpm. Exam Location:  Inpatient Procedure: 2D Echo, Color Doppler and Cardiac Doppler Indications:    stroke  History:        Patient has prior history of Echocardiogram examinations, most                 recent 01/02/2022. Chronic kidney disease; Risk Factors:Diabetes,                 Hypertension and Dyslipidemia.  Sonographer:    Delcie Roch RDCS Referring Phys: [phone] JARED M GARDNER  Sonographer Comments: Suboptimal subcostal window. IMPRESSIONS  1. Left ventricular ejection fraction, by estimation, is 55 to 60%. The left ventricle has  normal function. The left ventricle has no regional wall motion abnormalities. There is mild left ventricular hypertrophy. Left ventricular diastolic parameters are consistent with Grade I diastolic dysfunction (impaired relaxation).  2. Right ventricular systolic function is normal. The right ventricular size is normal.  3. No evidence of mitral valve regurgitation. Moderate mitral annular calcification.  4. The aortic valve was not well visualized. Aortic valve regurgitation is trivial. Aortic valve sclerosis/calcification is present, without any evidence of aortic stenosis.  5. Not well visualized. FINDINGS  Left Ventricle: Left ventricular ejection fraction, by estimation, is 55 to 60%. The left ventricle has normal function. The left ventricle has no regional wall motion abnormalities. The left ventricular internal cavity size was normal in size. There is  mild left ventricular hypertrophy. Left ventricular diastolic parameters are consistent with Grade I diastolic dysfunction (impaired relaxation). Right Ventricle: The right ventricular size is normal. Right ventricular  systolic function is normal. Left Atrium: Left atrial size was normal in size. Right Atrium: Right atrial size was normal in size. Pericardium: There is no evidence of pericardial effusion. Mitral Valve: Moderate mitral annular calcification. No evidence of mitral valve regurgitation. Tricuspid Valve: The tricuspid valve is normal in structure. Tricuspid valve regurgitation is not demonstrated. Aortic Valve: The aortic valve was not well visualized. Aortic valve regurgitation is trivial. Aortic regurgitation PHT measures 564 msec. Aortic valve sclerosis/calcification is present, without any evidence of aortic stenosis. Pulmonic Valve: Pulmonic valve regurgitation is not visualized. Aorta: The aortic root and ascending aorta are structurally normal, with no evidence of dilitation. Venous: Not well visualized. IAS/Shunts: The interatrial septum was not well visualized.  LEFT VENTRICLE PLAX 2D LVIDd:         3.70 cm   Diastology LVIDs:         2.80 cm   LV e' medial:    5.98 cm/s LV PW:         1.20 cm   LV E/e' medial:  13.4 LV IVS:        1.10 cm   LV e' lateral:   6.53 cm/s LVOT diam:     2.00 cm   LV E/e' lateral: 12.3 LV SV:         66 LV SV Index:   33 LVOT Area:     3.14 cm  RIGHT VENTRICLE RV Basal diam:  2.90 cm RV S prime:     9.90 cm/s TAPSE (M-mode): 1.3 cm LEFT ATRIUM             Index        RIGHT ATRIUM           Index LA diam:        3.80 cm 1.87 cm/m   RA Area:     13.20 cm LA Vol (A2C):   45.0 ml 22.19 ml/m  RA Volume:   29.10 ml  14.35 ml/m LA Vol (A4C):   42.0 ml 20.71 ml/m LA Biplane Vol: 44.6 ml 21.99 ml/m  AORTIC VALVE LVOT Vmax:   93.80 cm/s LVOT Vmean:  61.200 cm/s LVOT VTI:    0.210 m AI PHT:      564 msec  AORTA Ao Root diam: 3.50 cm Ao Asc diam:  3.70 cm MITRAL VALVE                TRICUSPID VALVE MV Area (PHT): 2.39 cm     TR Peak grad:   20.2 mmHg MV Decel Time: 317 msec     TR  Vmax:        225.00 cm/s MV E velocity: 80.20 cm/s MV A velocity: 100.00 cm/s  SHUNTS MV E/A ratio:  0.80          Systemic VTI:  0.21 m                             Systemic Diam: 2.00 cm Carolan Clines Electronically signed by Carolan Clines Signature Date/Time: 03/15/2023/11:38:45 AM    Final    CT ANGIO HEAD NECK W WO CM  Result Date: 03/15/2023 CLINICAL DATA:  Initial evaluation for stroke. EXAM: CT ANGIOGRAPHY HEAD AND NECK WITH AND WITHOUT CONTRAST TECHNIQUE: Multidetector CT imaging of the head and neck was performed using the standard protocol during bolus administration of intravenous contrast. Multiplanar CT image reconstructions and MIPs were obtained to evaluate the vascular anatomy. Carotid stenosis measurements (when applicable) are obtained utilizing NASCET criteria, using the distal internal carotid diameter as the denominator. RADIATION DOSE REDUCTION: This exam was performed according to the departmental dose-optimization program which includes automated exposure control, adjustment of the mA and/or kV according to patient size and/or use of iterative reconstruction technique. CONTRAST:  75mL OMNIPAQUE IOHEXOL 350 MG/ML SOLN COMPARISON:  Prior MRI and CT from earlier the same evening. FINDINGS: CTA NECK FINDINGS Aortic arch: Standard branching. Imaged portion shows no evidence of aneurysm or dissection. No significant stenosis of the major arch vessel origins. Right carotid system: No evidence of dissection, stenosis (50% or greater), or occlusion. Left carotid system: No evidence of dissection, stenosis (50% or greater), or occlusion. Vertebral arteries: Both vertebral arteries arise from the subclavian arteries. No proximal subclavian artery stenosis. Left vertebral artery dominant. No stenosis or dissection. Skeleton: No discrete or worrisome osseous lesions. Moderately advance cervical spondylosis at C3-4 through C6-7. Other neck: No other acute finding.  Prior left hemithyroidectomy. Upper chest: No other acute finding. Review of the MIP images confirms the above findings CTA HEAD FINDINGS Anterior  circulation: Mild atheromatous change about the carotid siphons without stenosis. A1 segments patent bilaterally. Azygous ACA widely patent to its distal aspect. No M1 stenosis or occlusion. No proximal MCA branch occlusion or high-grade stenosis. Distal MCA branches perfused and symmetric. Posterior circulation: Both V4 segments patent without stenosis. Both PICA patent at their origins. Basilar patent without stenosis. Superior cerebral arteries patent bilaterally. Both PCAs primarily supplied via the basilar. Left PCA widely patent to its distal aspect without stenosis. Irregularity with severe long segment stenosis involving the right P2 segment is seen (series 8, image 47). Right PCA remains patent to its distal aspect. Venous sinuses: Patent allowing for timing the contrast bolus. Anatomic variants: Azygous ACA.  No aneurysm. Review of the MIP images confirms the above findings IMPRESSION: 1. Negative CTA for large vessel occlusion or other emergent finding. 2. 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 3. No other hemodynamically significant stenosis about the major arterial vasculature of the head and neck. Electronically Signed   By: Rise Mu M.D.   On: 03/15/2023 03:12   MR BRAIN WO CONTRAST  Result Date: 03/15/2023 CLINICAL DATA:  Initial evaluation for facial droop, confusion. EXAM: MRI HEAD WITHOUT CONTRAST TECHNIQUE: Multiplanar, multiecho pulse sequences of the brain and surrounding structures were obtained without intravenous contrast. COMPARISON:  Prior CT from 03/14/2023 as well as previous MRI from 01/01/2022. FINDINGS: Brain: Generalized age-related cerebral atrophy. Patchy and confluent T2/FLAIR hyperintensity involving the periventricular deep white matter both cerebral  hemispheres as well as the pons, consistent with chronic small vessel ischemic disease, advanced in nature. Multiple remote lacunar infarcts present about the hemispheric cerebral white matter,  deep gray nuclei, and pons. 5 mm focus of restricted diffusion involving the left frontal corona radiata, consistent with a small acute ischemic small-vessel infarct (series 5, image 79). No associated hemorrhage or mass effect. Subtle diffusion signal abnormality about the contralateral anterior right basal ganglia noted, likely an evolving late subacute ischemic change (series 5, image 78). No other evidence for acute or subacute ischemia. Gray-white matter differentiation otherwise maintained. No acute intracranial hemorrhage. Multiple scattered chronic micro hemorrhages noted clustered about the brainstem and deep gray nuclei, most characteristic of chronic poorly controlled hypertension. No mass lesion, midline shift or mass effect. No hydrocephalus or extra-axial fluid collection. Pituitary gland and suprasellar region within normal limits. Vascular: Major intracranial vascular flow voids are maintained. Skull and upper cervical spine: Craniocervical junction within normal limits. Bone marrow signal intensity within normal limits. No scalp soft tissue abnormality. Sinuses/Orbits: Prior bilateral ocular lens replacement. Paranasal sinuses are clear. No significant mastoid effusion. Other: None. IMPRESSION: 1. 5 mm acute ischemic nonhemorrhagic infarct involving the left frontal corona radiata. 2. Subtle diffusion signal abnormality about the contralateral anterior right basal ganglia, likely an evolving late subacute ischemic infarct. 3. Underlying age-related cerebral atrophy with advanced chronic microvascular ischemic disease, with multiple remote lacunar infarcts involving the hemispheric cerebral white matter, deep gray nuclei, and pons. 4. Multiple scattered chronic micro hemorrhages clustered about the brainstem and deep gray nuclei, most characteristic of chronic poorly controlled hypertension. Electronically Signed   By: Rise Mu M.D.   On: 03/15/2023 00:25   CT Head Wo Contrast  Result  Date: 03/14/2023 CLINICAL DATA:  Neuro deficit, acute, stroke suspected EXAM: CT HEAD WITHOUT CONTRAST TECHNIQUE: Contiguous axial images were obtained from the base of the skull through the vertex without intravenous contrast. RADIATION DOSE REDUCTION: This exam was performed according to the departmental dose-optimization program which includes automated exposure control, adjustment of the mA and/or kV according to patient size and/or use of iterative reconstruction technique. COMPARISON:  Brain MR 01/01/2022 FINDINGS: Brain: No evidence of acute infarction, hemorrhage, hydrocephalus, extra-axial collection or mass lesion/mass effect. There is sequela of severe chronic microvascular ischemic change. 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 Vascular: No hyperdense vessel or unexpected calcification. Skull: Normal. Negative for fracture or focal lesion. Sinuses/Orbits: No middle ear or mastoid effusion. Paranasal sinuses are clear. Bilateral lens replacement. Orbits are otherwise unremarkable. Other: None. IMPRESSION: 1. No hemorrhage or CT evidence of an acute cortical infarct. 2. Sequela of severe chronic microvascular ischemic change. 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 Electronically Signed   By: Lorenza Cambridge M.D.   On: 03/14/2023 17:08    Vitals:   03/15/23 0245 03/15/23 0335 03/15/23 0900 03/15/23 1213  BP: (!) 153/66 (!) 183/90 139/79 116/70  Pulse: (!) 56 (!) 59 65 83  Resp: 16 18 20 19   Temp:   98.5 F (36.9 C) 98.2 F (36.8 C)  TempSrc:   Oral Oral  SpO2: 95% 100% 97% 96%  Weight:      Height:         PHYSICAL EXAM General:  Alert, well-nourished, well-developed patient in no acute distress Psych:  Mood and affect appropriate for situation CV: Regular rate and rhythm on monitor Respiratory:  Regular, unlabored respirations on room air GI: Abdomen soft and nontender   NEURO:  Mental Status: AA&O to person  and place, not time. Cannot provide history due to  baseline dementia.  Speech/Language: speech is without dysarthria or aphasia, except some difficulties with complex phrases.  Naming, repetition, fluency, and comprehension intact.  Cranial Nerves:  II: PERRL. Visual fields full.  III, IV, VI: EOMI. Eyelids elevate symmetrically.  V: Sensation is intact to light touch and symmetrical to face.  VII: Impaired ability to close L eyelid. Bell's phenomenon present as evident by upward and outward movement of eyes when attempting to close eyelid.  L frontal fold not present and cannot raise left eyebrow. L lower facial droop, particularly with smiling.  VIII: no hearing issues. Not formally tested.  IX, X: Palate elevates symmetrically. Phonation is normal.  ZO:[id number] shrug 5/5. XII: tongue is midline without fasciculations. Motor: 5/5 strength to all muscle groups tested.  Tone: is normal and bulk is normal Sensation- Intact to light touch bilaterally. Extinction absent to light touch to DSS.   Coordination: Finger to nose intact bilateral.  Gait- deferred   ASSESSMENT/PLAN  Abraheem Marck Aurich is a 82 y.o. male with PMHx significant for HTN, T2DM, and HLD who presents with about 2 day history of L facial droop and inability to close L eyelid.   Bell's Palsy, left Peripheral (versus central) due to involvement of eyelid and forehead Ruled down trauma, CVA, tumor with MRI findings and clinical presentation.  Unlikely infection with no clinical suspicion for infection, no fever, WBC 6 -- however due to severity of bell's palsy, treating empirically for viral infection with valacyclovir.  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  MRI: 5 mm acute ischemic nonhemorrhagic infarct involving the left frontal corona radiata; Subtle diffusion signal abnormality about the contralateral anterior right basal ganglia, likely an evolving late subacute ischemic infarct. 2D Echo: LVEF 55-60%. Interartial septum not well visualized.  LDL 55 HgbA1c 9.4 VTE prophylaxis - lovenox 40 No antithrombotic prior to admission, now on aspirin 81 mg daily and clopidogrel 75 mg daily for 21 days and then aspirin alone. Therapy recommendations:  pending Disposition:  home  Hx of Stroke/TIA 2023: Slurred speech. MRI of the brain showed acute and/or subacute infarcts in the right medial frontal gyrus, left inferior frontal gyrus, left body of the corpus callosum, left basal ganglia and left cerebral peduncle/thalamus.   2001: L sided paresthesia including arm, leg, trunk, face. No imaging report available.   Hypertension Home meds:  lisinopril 40 Unstable 140-190s systolic. Due to concern increased creatinine, stopped lisinopril, and started amlodipine 10 mg once daily Return to normotensive BP goal   Hyperlipidemia Home meds:  atorvastatin 80, resumed in hospital LDL 55, goal < 70 Continue statin at discharge  Diabetes type II Uncontrolled Home meds:  trulicity 1.5 once weekly, glipizide 10 once daily, insulin HgbA1c 9.4, goal < 7.0 CBGs SSI Recommend close follow-up with PCP for better DM control  Tobacco Abuse Patient smoked 0.5 packs per day for 20 years Quit smoking decades ago  Other Stroke Risk Factors ETOH use, advised to drink no more than 2 drink(s) a day Family hx stroke (mother)  Other active issues Dementia on home Aricept  and Namenda, as well as Celexa   Hospital day # 0   ATTENDING NOTE: I reviewed above note and agree with the assessment and plan. Pt was seen and examined.   Scott Vasquez is at the  bedside. Pt lying in bed, awake alert, orientated to people, and place but not to age or time. Significant left upper and lower facial weakness, consistent with bell's palsy, otherwise no focal deficit. MRI showed left CR small infarct, which likely to be incidental finding. Now on DAPT for 3 weeks and then ASA alone. Continue high dose statin. On prednisone and valtrex for bell's palsy. Consider artifical tears and ointment to protect left corneal.  PT and OT recommend HH.   For detailed assessment and plan, please refer to above/below as I have made changes wherever appropriate.   Neurology will sign off. Please call with questions. Pt will follow up with Ralene Bathe on 06/08/23. Thanks for the consult.  Marvel Plan, MD PhD Stroke Neurology 03/15/2023 3:19 PM    To contact Stroke Continuity provider, please refer to [email or web address]. After hours, contact General Neurology

## 2023-03-15 NOTE — Evaluation (Signed)
Occupational Therapy Evaluation Patient Details Name: Scott Vasquez MRN: [id number] DOB: Jul 10, 1941 Today's Date: 03/15/2023   History of Present Illness Pt is an 82 y/o male who presents 03/14/2023 with facial droop and generalized weakness that resolved and returned later the same day. 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 PMH significant for prior stroke, DM, HTN, back surgery, shoulder surgery, thyroid lobectomy.   Clinical Impression   PTA, pt lives with family, typically ambulatory with a cane and Modified Independent with ADLs. Pt presents now with deficits in standing balance and cognition though may be close to cognitive baseline. Pt with instability using cane for mobility though observed improvements using RW with PT after OT eval. Pt requires Supervision for UB ADL and Min A for LB ADLs. Pt's grandson present, reports pt appears close to baseline and open to [facility] therapy follow up at [place]. Will continue to follow acutely.      Recommendations for follow up therapy are one component of a multi-disciplinary discharge planning process, led by the attending physician.  Recommendations may be updated based on patient status, additional functional criteria and insurance authorization.   Assistance Recommended at Discharge Frequent or constant Supervision/Assistance  Patient can return home with the following A little help with walking and/or transfers;A little help with bathing/dressing/bathroom;Assistance with cooking/housework;Direct supervision/assist for medications management;Direct supervision/assist for financial management;Assist for transportation;Help with stairs or ramp for entrance    Functional Status Assessment  Patient has had a recent decline in their functional status and demonstrates the ability to make significant improvements in function in a  reasonable and predictable amount of time.  Equipment Recommendations  None recommended by OT    Recommendations for Other Services       Precautions / Restrictions Precautions Precautions: Fall Restrictions Weight Bearing Restrictions: No      Mobility Bed Mobility Overal bed mobility: Needs Assistance Bed Mobility: Supine to Sit     Supine to sit: Supervision, HOB elevated          Transfers Overall transfer level: Needs assistance Equipment used: Straight cane Transfers: Sit to/from Stand Sit to Stand: Min assist           General transfer comment: initial stand with poor balance holding to cane and then reaching out for tray table with other hand. attempted again with cane in hand though handheld assist needed to power up      Balance Overall balance assessment: Needs assistance Sitting-balance support: No upper extremity supported, Feet supported Sitting balance-Leahy Scale: Fair     Standing balance support: Bilateral upper extremity supported, Single extremity supported, During functional activity Standing balance-Leahy Scale: Poor                             ADL either performed or assessed with clinical judgement   ADL Overall ADL's : Needs assistance/impaired Eating/Feeding: Set up;Sitting   Grooming: Min guard;Standing;Wash/dry face;Oral care Grooming Details (indicate cue type and reason): standing > 6 min at sink for ADLs, minor cues to sequence Upper Body Bathing: Supervision/ safety;Sitting   Lower Body Bathing: Minimal assistance;Sit to/from stand   Upper Body Dressing : Supervision/safety;Sitting   Lower Body Dressing: Minimal assistance;Sit to/from stand Lower Body Dressing Details (indicate cue type and reason): able to slide feet into slippers but required Min A to get backs of  slippers on Toilet Transfer: Min guard;Ambulation   Toileting- Clothing Manipulation and Hygiene: Sitting/lateral lean;Sit to/from stand;Min  guard       Functional mobility during ADLs: Min guard;Cane (then tried RW with PT w/ improving gait appearance)       Vision Baseline Vision/History: 0 No visual deficits Ability to See in Adequate Light: 0 Adequate Patient Visual Report: No change from baseline Vision Assessment?: No apparent visual deficits     Perception     Praxis      Pertinent Vitals/Pain Pain Assessment Pain Assessment: No/denies pain     Hand Dominance Right   Extremity/Trunk Assessment Upper Extremity Assessment Upper Extremity Assessment: Generalized weakness (minor fine motor deficits but overall symmetrical strength)   Lower Extremity Assessment Lower Extremity Assessment: Defer to PT evaluation   Cervical / Trunk Assessment Cervical / Trunk Assessment: Normal   Communication Communication Communication: Expressive difficulties   Cognition Arousal/Alertness: Awake/alert Behavior During Therapy: Flat affect Overall Cognitive Status: History of cognitive impairments - at baseline                                 General Comments: hx of dementia, follows one step commands consistently, flat affect, memory deficits. per grandson present, appears close to baseline     General Comments  Grandson present    Exercises     Shoulder Instructions      Home Living Family/patient expects to be discharged to:: Private residence Living Arrangements: Spouse/significant other;Children Available Help at Discharge: Family;Available 24 hours/day Type of Home: House Home Access: Stairs to enter [employer] of Steps: 2 Entrance Stairs-Rails: Right;Left Home Layout: Two level;Able to live on main level with bedroom/bathroom     Bathroom Shower/Tub: Chief Strategy Officer: Standard     Home Equipment: Cane - single point;BSC/3in1;Rollator (4 wheels)   Additional Comments: looking into getting a shower chair. two adult children rotate to provide 24/7  assist  Lives With: Spouse    Prior Functioning/Environment Prior Level of Function : Independent/Modified Independent             Mobility Comments: cane for mobility ADLs Comments: Mod I for ADLs, family assist with the IADLs        OT Problem List: Impaired balance (sitting and/or standing);Decreased cognition;Decreased knowledge of use of DME or AE      OT Treatment/Interventions: Self-care/ADL training;Therapeutic exercise;Energy conservation;DME and/or AE instruction;Therapeutic activities;Patient/family education    OT Goals(Current goals can be found in the care plan section) Acute Rehab OT Goals Patient Stated Goal: home soon OT Goal Formulation: With patient/family Time For Goal Achievement: 03/29/23 Potential to Achieve Goals: Good ADL Goals Pt Will Perform Lower Body Bathing: with supervision;sit to/from stand Pt Will Perform Lower Body Dressing: with supervision;sit to/from stand Pt Will Transfer to Toilet: with supervision;ambulating Pt Will Perform Tub/Shower Transfer: Tub transfer;with supervision;ambulating  OT Frequency: Min 2X/week    Co-evaluation              AM-PAC OT "6 Clicks" Daily Activity     Outcome Measure Help from another person eating meals?: A Little Help from another person taking care of personal grooming?: A Little Help from another person toileting, which includes using toliet, bedpan, or urinal?: A Little Help from another person bathing (including washing, rinsing, drying)?: A Little Help from another person to put on and taking off regular upper body clothing?: A Little Help from another person  to put on and taking off regular lower body clothing?: A Little 6 Click Score: 18   End of Session Equipment Utilized During Treatment: Gait belt Nurse Communication: Mobility status  Activity Tolerance: Patient tolerated treatment well Patient left: Other (comment) (with PT and grandson)  OT Visit Diagnosis: Unsteadiness on feet  (R26.81);Other abnormalities of gait and mobility (R26.89)                Time: 0981-1914 OT Time Calculation (min): 19 min Charges:  OT General Charges $OT Visit: 1 Visit OT Evaluation $OT Eval Moderate Complexity: 1 Mod  Bradd Canary, OTR/L Acute Rehab Services Office: [phone]   Lorre Munroe 03/15/2023, 2:08 PM

## 2023-03-15 NOTE — Discharge Summary (Signed)
Physician Discharge Summary  Scott Vasquez ZOX:[id number] DOB: 05-Feb-1941 DOA: 03/14/2023  PCP: [name], NP  Admit date: 03/14/2023 Discharge date: 03/16/2023  Admitted From: Home  Discharge disposition: Home Health   Recommendations for Outpatient Follow-Up:   Follow up with your primary care provider in one week.  Check CBC, BMP, magnesium in the next visit Follow-up with neurology as outpatient on 06/08/2023.   Discharge Diagnosis:   Principal Problem:   Acute ischemic stroke [facility]) Active Problems:   Hyperlipidemia   Essential hypertension   DM2 (diabetes mellitus, type 2) (HCC)   Mild dementia without behavioral disturbance, psychotic disturbance, mood disturbance, or anxiety (HCC)   Discharge Condition: Improved.  Diet recommendation: Low sodium, heart healthy.  Carbohydrate-modified.   Wound care: None.  Code status: Full.   History of Present Illness:   Scott Vasquez is a 82 y.o. male with past medical history significant for prior strokes, vascular dementia, DM2, HTN who presented to hospital with left-sided facial weakness generalized weakness and concern for stroke.  CT head scan without any acute findings.  MRI of the brain showed 5 mm acute none hemorrhagic infarct involving the left frontal corona radiator.  Neurology was consulted and patient was out of the hospital for further evaluation and treatment.  Patient was then admitted to hospital for further evaluation and treatment.   Hospital Course:   Following conditions were addressed during hospitalization as listed below,  Acute ischemic stroke MRI with left frontal corona radiata stroke.  Neurology on board.  Continue telemetry monitor.  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  2D echo shows LV ejection fraction of 55 to 60% with grade 1 diastolic dysfunction.  PT OT recommend home health PT on discharge.   Neurology has recommended aspirin and Plavix for 21 days followed by aspirin alone on discharge.  Patient will follow-up with neurology as outpatient which has been scheduled for.  Left Bell's palsy.  Seen by neurology.  Patient has been recommended oral valacyclovir and high-dose of steroids on discharge for next 7 days.  Protonix added to the regimen for GI protection.  Teardrops and supportive care.  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  On Namenda, Aricept Celexa.  Continue on discharge.   DM2 (diabetes mellitus, type 2) (HCC) Patient will resume glipizide, Trulicity, diabetic diet on discharge.    Essential hypertension On lisinopril and amlodipine at home.  Will resume on discharge.   Hyperlipidemia Continue Lipitor 80 mg.  Lipid panel shows LDL of 55.   Disposition.  At this time, patient is stable for disposition home with home health.  Communicated with the patient's son at bedside.  Medical Consultants:   Neurology  Procedures:    None Subjective:   Today, patient was seen and examined at bedside.  Denies interval complains, has facial weakness but no extremity weakness.  Discharge Exam:   Vitals:   03/15/23 1213 03/15/23 1723  BP: 116/70 110/64  Pulse: 83 77  Resp: 19 20  Temp: 98.2 F (36.8 C) 98.1 F (36.7 C)  SpO2: 96% 98%   Vitals:   03/15/23 0335 03/15/23 0900 03/15/23 1213 03/15/23 1723  BP: (!) 183/90 139/79 116/70 110/64  Pulse: (!) 59 65 83 77  Resp: 18 20 19 20   Temp:  98.5 F (36.9 C) 98.2 F (36.8 C) 98.1  F (36.7 C)  TempSrc:  Oral Oral Oral  SpO2: 100% 97% 96% 98%  Weight:      Height:       Body mass index is 26.82 kg/m.   General: Alert awake, not in obvious distress, elderly male, HENT: pupils equally reacting to light,  No scleral pallor or icterus noted. Oral mucosa is moist.  Left Bell's palsy. Chest:  Clear breath sounds.    No crackles or wheezes.  CVS: S1 &S2 heard. No murmur.  Regular rate and rhythm. Abdomen: Soft, nontender, nondistended.  Bowel sounds are heard.   Extremities: No cyanosis, clubbing or edema.  Peripheral pulses are palpable. Psych: Alert, awake and communicative, CNS: Left Bell's palsy.  Moves all extremities. Skin: Warm and dry.  No rashes noted.  The results of significant diagnostics from this hospitalization (including imaging, microbiology, ancillary and laboratory) are listed below for reference.     Diagnostic Studies:   ECHOCARDIOGRAM COMPLETE  Result Date: 03/15/2023    ECHOCARDIOGRAM REPORT   Patient Name:   Scott Vasquez Date of Exam: 03/15/2023 Medical Rec #:  [id number]    Height:       70.0 in Accession #:    [id number]   Weight:       186.9 lb Date of Birth:  08/04/1941    BSA:          2.028 m Patient Age:    81 years     BP:           139/79 mmHg Patient Gender: M            HR:           63 bpm. Exam Location:  Inpatient Procedure: 2D Echo, Color Doppler and Cardiac Doppler Indications:    stroke  History:        Patient has prior history of Echocardiogram examinations, most                 recent 01/02/2022. Chronic kidney disease; Risk Factors:Diabetes,                 Hypertension and Dyslipidemia.  Sonographer:    Delcie Roch RDCS Referring Phys: [id number] JARED M GARDNER  Sonographer Comments: Suboptimal subcostal window. IMPRESSIONS  1. Left ventricular ejection fraction, by estimation, is 55 to 60%. The left ventricle has normal function. The left ventricle has no regional wall motion abnormalities. There is mild left ventricular hypertrophy. Left ventricular diastolic parameters are consistent with Grade I diastolic dysfunction (impaired relaxation).  2. Right ventricular systolic function is normal. The right ventricular size is normal.  3. No evidence of mitral valve regurgitation. Moderate mitral annular calcification.  4. The aortic valve was not well visualized. Aortic  valve regurgitation is trivial. Aortic valve sclerosis/calcification is present, without any evidence of aortic stenosis.  5. Not well visualized. FINDINGS  Left Ventricle: Left ventricular ejection fraction, by estimation, is 55 to 60%. The left ventricle has normal function. The left ventricle has no regional wall motion abnormalities. The left ventricular internal cavity size was normal in size. There is  mild left ventricular hypertrophy. Left ventricular diastolic parameters are consistent with Grade I diastolic dysfunction (impaired relaxation). Right Ventricle: The right ventricular size is normal. Right ventricular systolic function is normal. Left Atrium: Left atrial size was normal in size. Right Atrium: Right atrial size was normal in size. Pericardium: There is no evidence of pericardial effusion. Mitral Valve: Moderate mitral annular calcification. No evidence of mitral  valve regurgitation. Tricuspid Valve: The tricuspid valve is normal in structure. Tricuspid valve regurgitation is not demonstrated. Aortic Valve: The aortic valve was not well visualized. Aortic valve regurgitation is trivial. Aortic regurgitation PHT measures 564 msec. Aortic valve sclerosis/calcification is present, without any evidence of aortic stenosis. Pulmonic Valve: Pulmonic valve regurgitation is not visualized. Aorta: The aortic root and ascending aorta are structurally normal, with no evidence of dilitation. Venous: Not well visualized. IAS/Shunts: The interatrial septum was not well visualized.  LEFT VENTRICLE PLAX 2D LVIDd:         3.70 cm   Diastology LVIDs:         2.80 cm   LV e' medial:    5.98 cm/s LV PW:         1.20 cm   LV E/e' medial:  13.4 LV IVS:        1.10 cm   LV e' lateral:   6.53 cm/s LVOT diam:     2.00 cm   LV E/e' lateral: 12.3 LV SV:         66 LV SV Index:   33 LVOT Area:     3.14 cm  RIGHT VENTRICLE RV Basal diam:  2.90 cm RV S prime:     9.90 cm/s TAPSE (M-mode): 1.3 cm LEFT ATRIUM             Index         RIGHT ATRIUM           Index LA diam:        3.80 cm 1.87 cm/m   RA Area:     13.20 cm LA Vol (A2C):   45.0 ml 22.19 ml/m  RA Volume:   29.10 ml  14.35 ml/m LA Vol (A4C):   42.0 ml 20.71 ml/m LA Biplane Vol: 44.6 ml 21.99 ml/m  AORTIC VALVE LVOT Vmax:   93.80 cm/s LVOT Vmean:  61.200 cm/s LVOT VTI:    0.210 m AI PHT:      564 msec  AORTA Ao Root diam: 3.50 cm Ao Asc diam:  3.70 cm MITRAL VALVE                TRICUSPID VALVE MV Area (PHT): 2.39 cm     TR Peak grad:   20.2 mmHg MV Decel Time: 317 msec     TR Vmax:        225.00 cm/s MV E velocity: 80.20 cm/s MV A velocity: 100.00 cm/s  SHUNTS MV E/A ratio:  0.80         Systemic VTI:  0.21 m                             Systemic Diam: 2.00 cm Scott Vasquez Electronically signed by Scott Vasquez Signature Date/Time: 03/15/2023/11:38:45 AM    Final    CT ANGIO HEAD NECK W WO CM  Result Date: 03/15/2023 CLINICAL DATA:  Initial evaluation for stroke. EXAM: CT ANGIOGRAPHY HEAD AND NECK WITH AND WITHOUT CONTRAST TECHNIQUE: Multidetector CT imaging of the head and neck was performed using the standard protocol during bolus administration of intravenous contrast. Multiplanar CT image reconstructions and MIPs were obtained to evaluate the vascular anatomy. Carotid stenosis measurements (when applicable) are obtained utilizing NASCET criteria, using the distal internal carotid diameter as the denominator. RADIATION DOSE REDUCTION: This exam was performed according to the departmental dose-optimization program which includes automated exposure control, adjustment of the mA and/or kV  according to patient size and/or use of iterative reconstruction technique. CONTRAST:  75mL OMNIPAQUE IOHEXOL 350 MG/ML SOLN COMPARISON:  Prior MRI and CT from earlier the same evening. FINDINGS: CTA NECK FINDINGS Aortic arch: Standard branching. Imaged portion shows no evidence of aneurysm or dissection. No significant stenosis of the major arch vessel origins. Right carotid system: No  evidence of dissection, stenosis (50% or greater), or occlusion. Left carotid system: No evidence of dissection, stenosis (50% or greater), or occlusion. Vertebral arteries: Both vertebral arteries arise from the subclavian arteries. No proximal subclavian artery stenosis. Left vertebral artery dominant. No stenosis or dissection. Skeleton: No discrete or worrisome osseous lesions. Moderately advance cervical spondylosis at C3-4 through C6-7. Other neck: No other acute finding.  Prior left hemithyroidectomy. Upper chest: No other acute finding. Review of the MIP images confirms the above findings CTA HEAD FINDINGS Anterior circulation: Mild atheromatous change about the carotid siphons without stenosis. A1 segments patent bilaterally. Azygous ACA widely patent to its distal aspect. No M1 stenosis or occlusion. No proximal MCA branch occlusion or high-grade stenosis. Distal MCA branches perfused and symmetric. Posterior circulation: Both V4 segments patent without stenosis. Both PICA patent at their origins. Basilar patent without stenosis. Superior cerebral arteries patent bilaterally. Both PCAs primarily supplied via the basilar. Left PCA widely patent to its distal aspect without stenosis. Irregularity with severe long segment stenosis involving the right P2 segment is seen (series 8, image 47). Right PCA remains patent to its distal aspect. Venous sinuses: Patent allowing for timing the contrast bolus. Anatomic variants: Azygous ACA.  No aneurysm. Review of the MIP images confirms the above findings IMPRESSION: 1. Negative CTA for large vessel occlusion or other emergent finding. 2. 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 3. No other hemodynamically significant stenosis about the major arterial vasculature of the head and neck. Electronically Signed   By: Rise Mu M.D.   On: 03/15/2023 03:12   MR BRAIN WO CONTRAST  Result Date: 03/15/2023 CLINICAL DATA:  Initial evaluation for  facial droop, confusion. EXAM: MRI HEAD WITHOUT CONTRAST TECHNIQUE: Multiplanar, multiecho pulse sequences of the brain and surrounding structures were obtained without intravenous contrast. COMPARISON:  Prior CT from 03/14/2023 as well as previous MRI from 01/01/2022. FINDINGS: Brain: Generalized age-related cerebral atrophy. Patchy and confluent T2/FLAIR hyperintensity involving the periventricular deep white matter both cerebral hemispheres as well as the pons, consistent with chronic small vessel ischemic disease, advanced in nature. Multiple remote lacunar infarcts present about the hemispheric cerebral white matter, deep gray nuclei, and pons. 5 mm focus of restricted diffusion involving the left frontal corona radiata, consistent with a small acute ischemic small-vessel infarct (series 5, image 79). No associated hemorrhage or mass effect. Subtle diffusion signal abnormality about the contralateral anterior right basal ganglia noted, likely an evolving late subacute ischemic change (series 5, image 78). No other evidence for acute or subacute ischemia. Gray-white matter differentiation otherwise maintained. No acute intracranial hemorrhage. Multiple scattered chronic micro hemorrhages noted clustered about the brainstem and deep gray nuclei, most characteristic of chronic poorly controlled hypertension. No mass lesion, midline shift or mass effect. No hydrocephalus or extra-axial fluid collection. Pituitary gland and suprasellar region within normal limits. Vascular: Major intracranial vascular flow voids are maintained. Skull and upper cervical spine: Craniocervical junction within normal limits. Bone marrow signal intensity within normal limits. No scalp soft tissue abnormality. Sinuses/Orbits: Prior bilateral ocular lens replacement. Paranasal sinuses are clear. No significant mastoid effusion. Other: None. IMPRESSION: 1. 5 mm  acute ischemic nonhemorrhagic infarct involving the left frontal corona radiata.  2. Subtle diffusion signal abnormality about the contralateral anterior right basal ganglia, likely an evolving late subacute ischemic infarct. 3. Underlying age-related cerebral atrophy with advanced chronic microvascular ischemic disease, with multiple remote lacunar infarcts involving the hemispheric cerebral white matter, deep gray nuclei, and pons. 4. Multiple scattered chronic micro hemorrhages clustered about the brainstem and deep gray nuclei, most characteristic of chronic poorly controlled hypertension. Electronically Signed   By: Rise Mu M.D.   On: 03/15/2023 00:25   CT Head Wo Contrast  Result Date: 03/14/2023 CLINICAL DATA:  Neuro deficit, acute, stroke suspected EXAM: CT HEAD WITHOUT CONTRAST TECHNIQUE: Contiguous axial images were obtained from the base of the skull through the vertex without intravenous contrast. RADIATION DOSE REDUCTION: This exam was performed according to the departmental dose-optimization program which includes automated exposure control, adjustment of the mA and/or kV according to patient size and/or use of iterative reconstruction technique. COMPARISON:  Brain MR 01/01/2022 FINDINGS: Brain: No evidence of acute infarction, hemorrhage, hydrocephalus, extra-axial collection or mass lesion/mass effect. There is sequela of severe chronic microvascular ischemic change. 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 Vascular: No hyperdense vessel or unexpected calcification. Skull: Normal. Negative for fracture or focal lesion. Sinuses/Orbits: No middle ear or mastoid effusion. Paranasal sinuses are clear. Bilateral lens replacement. Orbits are otherwise unremarkable. Other: None. IMPRESSION: 1. No hemorrhage or CT evidence of an acute cortical infarct. 2. Sequela of severe chronic microvascular ischemic change. 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 Electronically Signed   By: Lorenza Cambridge M.D.   On: 03/14/2023 17:08     Labs:   Basic Metabolic  Panel: Recent Labs  Lab 03/14/23 1654  NA 140  K 3.9  CL 104  CO2 30  GLUCOSE 106*  BUN 13  CREATININE 1.32*  CALCIUM 8.9   GFR Estimated Creatinine Clearance: 43.8 mL/min (A) (by C-G formula based on SCr of 1.32 mg/dL (H)). Liver Function Tests: No results for input(s): "AST", "ALT", "ALKPHOS", "BILITOT", "PROT", "ALBUMIN" in the last 168 hours. No results for input(s): "LIPASE", "AMYLASE" in the last 168 hours. No results for input(s): "AMMONIA" in the last 168 hours. Coagulation profile No results for input(s): "INR", "PROTIME" in the last 168 hours.  CBC: Recent Labs  Lab 03/14/23 1654  WBC 6.1  HGB 10.1*  HCT 31.8*  MCV 91.6  PLT 192   Cardiac Enzymes: No results for input(s): "CKTOTAL", "CKMB", "CKMBINDEX", "TROPONINI" in the last 168 hours. BNP: Invalid input(s): "POCBNP" CBG: Recent Labs  Lab 03/14/23 2335 03/15/23 0015 03/15/23 0643 03/15/23 1218 03/15/23 1727  GLUCAP 81 96 97 198* 291*   D-Dimer No results for input(s): "DDIMER" in the last 72 hours. Hgb A1c No results for input(s): "HGBA1C" in the last 72 hours. Lipid Profile Recent Labs    03/15/23 0441  CHOL 107  HDL 40*  LDLCALC 55  TRIG 61  CHOLHDL 2.7   Thyroid function studies No results for input(s): "TSH", "T4TOTAL", "T3FREE", "THYROIDAB" in the last 72 hours.  Invalid input(s): "FREET3" Anemia work up No results for input(s): "VITAMINB12", "FOLATE", "FERRITIN", "TIBC", "IRON", "RETICCTPCT" in the last 72 hours. Microbiology Recent Results (from the past 240 hour(s))  Urine Culture     Status: None   Collection Time: 03/14/23  4:56 PM   Specimen: Urine, Random  Result Value Ref Range Status   Specimen Description   Final    URINE, RANDOM Performed at [facility],  [address], [place], [place] [address]    Special Requests   Final    NONE Reflexed from [id number] Performed at [facility], [address], [place], [place]  [address]    Culture   Final    NO GROWTH Performed at [facility], [address] [place], [place] [address]    Report Status 03/15/2023 FINAL  Final     Discharge Instructions:   Discharge Instructions     Diet - low sodium heart healthy   Complete by: As directed    Diet Carb Modified   Complete by: As directed    Discharge instructions   Complete by: As directed    Follow up with your primary care provider in one week. Check blood work at that time. Seek medical attention for worsening symptoms. Follow up with [facility] Marlowe Kays on 06/08/23. Your blood pressure medication has changed. Complete the course of steroids and antiviral medication.   Increase activity slowly   Complete by: As directed       Allergies as of 03/15/2023       Reactions   Actos [pioglitazone] Other (See Comments)   Diarrhea    Metformin And Related Other (See Comments)   Diarrhea         Medication List     STOP taking these medications    lisinopril 40 MG tablet Commonly known as: ZESTRIL       TAKE these medications    Accu-Chek Aviva Plus test strip Generic drug: glucose blood TEST BLOOD SUGAR TWICE DAILY   Accu-Chek Guide w/Device Kit USE AS DIRECTED   amLODipine 10 MG tablet Commonly known as: NORVASC Take 1 tablet (10 mg total) by mouth daily.   aspirin 81 MG chewable tablet Chew 1 tablet (81 mg total) by mouth daily.   atorvastatin 80 MG tablet Commonly known as: LIPITOR TAKE 1 TABLET EVERY DAY   citalopram 20 MG tablet Commonly known as: CELEXA TAKE 1 TABLET EVERY DAY   clopidogrel 75 MG tablet Commonly known as: PLAVIX Take 1 tablet (75 mg total) by mouth daily for 21 days.   donepezil 10 MG tablet Commonly known as: ARICEPT TAKE 1 TABLET AT BEDTIME   Gemtesa 75 MG Tabs Generic drug: Vibegron Take 75 mg by mouth daily.   glipiZIDE 10 MG 24 hr tablet Commonly known as: GLUCOTROL XL Take 1 tablet (10 mg total) by mouth daily with  breakfast.   levothyroxine 50 MCG tablet Commonly known as: SYNTHROID TAKE 1 TABLET EVERY DAY   memantine 10 MG tablet Commonly known as: NAMENDA TAKE 1 TABLET TWICE DAILY What changed:  how much to take how to take this when to take this additional instructions   pantoprazole 40 MG tablet Commonly known as: Protonix Take 1 tablet (40 mg total) by mouth daily for 14 days.   Pen Needles 31G X 5 MM Misc Use with insulin pen   polyvinyl alcohol 1.4 % ophthalmic solution Commonly known as: LIQUIFILM TEARS Place 1 drop into the left eye as needed for dry eyes.   predniSONE 20 MG tablet Commonly known as: DELTASONE Take 3 tablets (60 mg total) by mouth daily with breakfast for 7 days.   Trulicity 1.5 MG/0.5ML Sopn Generic drug: Dulaglutide Inject 1.5 mg into the skin once a week. What changed: additional instructions   valACYclovir 1000 MG tablet Commonly known as: VALTREX Take 1 tablet (1,000 mg total) by mouth 3 (three) times daily for 7 days.  Follow-up Information     Marcos Eke, PA-C Follow up on 06/08/2023.   Specialty: Neurology Contact information: [address] [place] [address] [place] [address] [phone]         [facility] Follow up.   Specialty: Home Health Services Why: Home Health physical and occupational therapy services. Office will call after you are discharged to arrange follow up. Contact information: [address] [place] [address] [phone]         [name], NP Follow up in 1 week(s).   Specialty: Family Medicine Contact information: [address] [place] [address] [phone]                  Time coordinating discharge: 39 minutes  Signed:     [facility] 03/16/2023, 2:35 PM

## 2023-03-15 NOTE — Progress Notes (Signed)
OT Cancellation Note  Patient Details Name: Ayvan Stockhausen Hessler MRN: [id number] DOB: November 26, 1940   Cancelled Treatment:    Reason Eval/Treat Not Completed: Other (comment) Attempted OT eval this AM though family member at bedside reported pt trying to sleep. Will follow up if schedule permits.  Lorre Munroe 03/15/2023, 8:23 AM

## 2023-03-15 NOTE — Inpatient Diabetes Management (Signed)
Inpatient Diabetes Program Recommendations  AACE/ADA: New Consensus Statement on Inpatient Glycemic Control (2015)  Target Ranges:  Prepandial:   less than 140 mg/dL      Peak postprandial:   less than 180 mg/dL (1-2 hours)      Critically ill patients:  140 - 180 mg/dL   Lab Results  Component Value Date   GLUCAP 97 03/15/2023   HGBA1C 9.4 (A) 12/31/2022    Review of Glycemic Control  Latest Reference Range & Units 03/14/23 16:37 03/14/23 22:56 03/14/23 23:19 03/14/23 23:35 03/15/23 00:15 03/15/23 06:43  Glucose-Capillary 70 - 99 mg/dL 161 (H) 51 (L) 64 (L) 81 96 97   Diabetes history: DM 2 Outpatient Diabetes medications: Trulicity 1.5 mg QFriday, Glipizide 10 mg Daily Current orders for Inpatient glycemic control:  Novolog 0-15 units tid  PO Prednisone 60 mg Daily A1c 9.4% on 5/23  Creat 1.32  Inpatient Diabetes Program Recommendations:    Note: Glucose trends on the lower side. Had hypoglycemia overnight without the use of insulin. Renal function elevated.   -  Consider lowering Novolog Correction scale to 0-9 units tid + hs scale.  Thanks,  Christena Deem RN, MSN, BC-ADM Inpatient Diabetes Coordinator Team Pager [phone] (8a-5p)

## 2023-03-15 NOTE — TOC Transition Note (Signed)
Transition of Care [facility]) - CM/SW Discharge Note   Patient Details  Name: Scott Vasquez MRN: [id number] Date of Birth: 04/11/1941  Transition of Care [facility]) CM/SW Contact:  Ronny Bacon, RN Phone Number: 03/15/2023, 4:44 PM   Clinical Narrative:   Patient with possible discharge home today. Spoke with patient and Grandson at bedside. PT/OT recommendations are for [facility] PT/OT, patient used [facility] in the past (2023) and wishes to use them again. HH PT/OT arranged through [facility]. Grandson confirmed patient lives with wife and daughter, has [place], [place], and [facility] at home. Family is looking into purchasing a Personal assistant. Lucila Maine will transport patient home at discharge.    Final next level of care: Home w Home Health Services Barriers to Discharge: No Barriers Identified   Patient Goals and CMS Choice      Discharge Placement                         Discharge Plan and Services Additional resources added to the After Visit Summary for                            HH Arranged: PT, OT HH Agency: [facility] Date [facility] Agency Contacted: 03/15/23 Time HH Agency Contacted: [id number] Representative spoke with at [facility] Agency: Kandee Keen  Social Determinants of Health (SDOH) Interventions SDOH Screenings   Food Insecurity: No Food Insecurity (03/15/2023)  Housing: Low Risk  (03/15/2023)  Transportation Needs: No Transportation Needs (03/15/2023)  Utilities: Not At Risk (03/15/2023)  Depression (PHQ2-9): Medium Risk (02/12/2022)  Financial Resource Strain: Low Risk  (05/09/2021)  Tobacco Use: Medium Risk (03/14/2023)     Readmission Risk Interventions     No data to display

## 2023-03-15 NOTE — Progress Notes (Signed)
Same day note  Scott Vasquez is a 82 y.o. male with past medical history significant for prior strokes, vascular dementia, DM2, HTN who presented to hospital with left-sided facial weakness generalized weakness and concern for stroke.  CT head scan without any acute findings.  MRI of the brain showed 5 mm acute none hemorrhagic infarct involving the left frontal corona radiator.  Neurology was consulted and patient was out of the hospital for further evaluation and treatment.  Patient was then admitted to hospital for further evaluation and treatment.  Patient seen and examined at bedside.  Patient was admitted to the hospital for possible stroke.  At the time of my evaluation, patient complains of left facial weakness.  Physical examination reveals elderly male, left facial weakness.  Strength okay in the extremities.  Laboratory data and imaging was reviewed  Assessment and Plan:  * Acute ischemic stroke MRI with left frontal corona radiata stroke.  Neurology on board.  Continue telemetry monitor.  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  2D echo shows LV ejection fraction of 55 to 60% with grade 1 diastolic dysfunction.  PT OT recommend home health PT on discharge.  Follow neurology recommendation.  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  On Namenda Aricept Celexa.     DM2 (diabetes mellitus, type 2) (HCC) Continue sliding scale insulin Accu-Cheks diabetic diet.  Speech therapy has seen the patient.  Latest POC glucose of 198.   Essential hypertension On lisinopril at home.  Will allow permissive hypertension for now.   Hyperlipidemia Cont Statin.  Lipid panel shows LDL of 55.   No Charge  Signed,  Tenny Craw, MD [facility]

## 2023-03-15 NOTE — Consult Note (Signed)
NEUROLOGY CONSULTATION NOTE   Date of service: March 15, 2023 Patient Name: Scott Vasquez MRN:  [id number] DOB:  08-14-1940 Reason for consult: "stokes on MRI Brain" Requesting Provider: Hillary Bow, DO _ _ _   _ __   _ __ _ _  __ __   _ __   __ _  History of Present Illness  Scott Vasquez is a 81 y.o. male with PMH significant for HTN, DM2, HLD who presents with about 2 day hx of L facial droop. Liquids dripping from mouth. L eye itching. Came to the ED since this was persistent. Has upper and lower facial droop. ED team got MRI brain which demosntrated 5mm acute stroke in left frontal corona radiata along with a ?late subacute R basal ganglia stroke.  Neurology consulted.  LKW: 03/13/23 mRS: 2 tNKASE: not offered, outside window Thrombectomy: not offered, outside window NIHSS components Score: Comment  1a Level of Conscious 0[x]  1[]  2[]  3[]      1b LOC Questions 0[x]  1[]  2[]       1c LOC Commands 0[x]  1[]  2[]       2 Best Gaze 0[x]  1[]  2[]       3 Visual 0[x]  1[]  2[]  3[]      4 Facial Palsy 0[]  1[]  2[x]  3[]      5a Motor Arm - left 0[x]  1[]  2[]  3[]  4[]  UN[]    5b Motor Arm - Right 0[x]  1[]  2[]  3[]  4[]  UN[]    6a Motor Leg - Left 0[x]  1[]  2[]  3[]  4[]  UN[]    6b Motor Leg - Right 0[x]  1[]  2[]  3[]  4[]  UN[]    7 Limb Ataxia 0[x]  1[]  2[]  3[]  UN[]     8 Sensory 0[x]  1[]  2[]  UN[]      9 Best Language 0[x]  1[]  2[]  3[]      10 Dysarthria 0[x]  1[]  2[]  UN[]      11 Extinct. and Inattention 0[x]  1[]  2[]       TOTAL: 2       ROS   Constitutional Denies weight loss, fever and chills.   HEENT Denies changes in vision and hearing.   Respiratory Denies SOB and cough.   CV Denies palpitations and CP   GI Denies abdominal pain, nausea, vomiting and diarrhea.   GU Denies dysuria and urinary frequency.   MSK Denies myalgia and joint pain.   Skin Denies rash and pruritus.   Neurological Denies headache and syncope.   Psychiatric Denies recent changes in mood. Denies anxiety and depression.    Past  History   Past Medical History:  Diagnosis Date   Anemia    iron deficiency   Diabetes mellitus    type II   Diverticulosis of colon    Hypertension    Stroke (HCC)    numbness in left hand   Thyroid nodule    Past Surgical History:  Procedure Laterality Date   BACK SURGERY     CATARACT EXTRACTION, BILATERAL     SHOULDER SURGERY  08/2007   Supple   THYROID LOBECTOMY Left 04/29/2015   THYROID LOBECTOMY Left 04/29/2015   Procedure: THYROID LOBECTOMY;  Surgeon: Darnell Level, MD;  Location: [facility] OR;  Service: General;  Laterality: Left;   Family History  Problem Relation Age of Onset   Diabetes Other    Diabetes Father        Died from diabetic coma in 1971/04/15   Hypertension Father    Stroke Mother        Died in 34   Hypertension Mother  Social History   Socioeconomic History   Marital status: Married    Spouse name: Not on file   Number of children: 2   Years of education: 8   Highest education level: Not on file  Occupational History   Occupation: retired    Associate Professor: [employer]  Tobacco Use   Smoking status: Former    Current packs/day: 0.00    Average packs/day: 0.5 packs/day for 20.0 years (10.0 ttl pk-yrs)    Types: Cigarettes    Start date: 27    Quit date: 08/10/1958    Years since quitting: 64.6   Smokeless tobacco: Never  Vaping Use   Vaping status: Never Used  Substance and Sexual Activity   Alcohol use: No   Drug use: No   Sexual activity: Not on file  Other Topics Concern   Not on file  Social History Narrative   Pt is R handed   Lives in single story home with his wife, [name]   2 adult children   8th grade education   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[employer]   Married 56 years   Social Determinants of Health   Financial Resource Strain: Low Risk  (05/09/2021)   Overall Financial Resource Strain (CARDIA)    Difficulty of Paying Living Expenses: Not hard at all  Food Insecurity: No Food Insecurity (01/07/2023)   Hunger Vital Sign    Worried  About Running Out of Food in the Last Year: Never true    Ran Out of Food in the Last Year: Never true  Transportation Needs: No Transportation Needs (01/07/2023)   PRAPARE - Administrator, Civil Service (Medical): No    Lack of Transportation (Non-Medical): No  Physical Activity: Not on file  Stress: Not on file  Social Connections: Not on file   Allergies  Allergen Reactions   Actos [Pioglitazone] Other (See Comments)    Diarrhea    Metformin And Related Other (See Comments)    Diarrhea      Medications  (Not in a hospital admission)    Vitals   Vitals:   03/14/23 2102 03/14/23 2104 03/14/23 2325 03/15/23 0020  BP: (!) 169/87  (!) 191/82 (!) 194/87  Pulse: 60  (!) 57 (!) 59  Resp: 18  17 17   Temp: 98.4 F (36.9 C)   98.3 F (36.8 C)  TempSrc: Oral   Oral  SpO2: 98%  100% 100%  Weight:  84.8 kg    Height:  5\' 10"  (1.778 m)       Body mass index is 26.82 kg/m.  Physical Exam   General: Laying comfortably in bed; in no acute distress.  HENT: Normal oropharynx and mucosa. Normal external appearance of ears and nose.  Neck: Supple, no pain or tenderness  CV: No JVD. No peripheral edema.  Pulmonary: Symmetric Chest rise. Normal respiratory effort.  Abdomen: Soft to touch, non-tender. Ext: No cyanosis, edema, or deformity  Skin: No rash. Normal palpation of skin.   Musculoskeletal: Normal digits and nails by inspection. No clubbing.   Neurologic Examination  Mental status/Cognition: Alert, oriented to self, place, month and year, good attention.  Speech/language: Fluent, comprehension intact, object naming intact, repetition intact.  Cranial nerves:   CN II Pupils equal and reactive to light, no VF deficits    CN III,IV,VI EOM intact, no gaze preference or deviation, no nystagmus   CN V normal sensation in V1, V2, and V3 segments bilaterally    CN VII L upper and  lower facial droop with incomplete L eye closure.   CN VIII normal hearing to speech     CN IX & X normal palatal elevation, no uvular deviation    CN XI 5/5 head turn and 5/5 shoulder shrug bilaterally    CN XII midline tongue protrusion    Motor:  Muscle bulk: normal, tone normal, pronator drift nond tremor none Mvmt Root Nerve  Muscle Right Left Comments  SA C5/6 Ax Deltoid 5 5   EF C5/6 Mc Biceps 5 5   EE C6/7/8 Rad Triceps 5 5   WF C6/7 Med FCR     WE C7/8 PIN ECU     F Ab C8/T1 U ADM/FDI 5 5   HF L1/2/3 Fem Illopsoas 5 5   KE L2/3/4 Fem Quad 5 5   DF L4/5 D Peron Tib Ant 5 5   PF S1/2 Tibial Grc/Sol 5 5    Sensation:  Light touch Intact throughout   Pin prick    Temperature    Vibration   Proprioception    Coordination/Complex Motor:  - Finger to Nose intact BL - Heel to shin intact BL - Rapid alternating movement are normal - Gait: deferred for patient safety.  Labs   CBC:  Recent Labs  Lab 03/14/23 1654  WBC 6.1  HGB 10.1*  HCT 31.8*  MCV 91.6  PLT 192    Basic Metabolic Panel:  Lab Results  Component Value Date   NA 140 03/14/2023   K 3.9 03/14/2023   CO2 30 03/14/2023   GLUCOSE 106 (H) 03/14/2023   BUN 13 03/14/2023   CREATININE 1.32 (H) 03/14/2023   CALCIUM 8.9 03/14/2023   GFRNONAA 54 (L) 03/14/2023   GFRAA 49 (L) 07/17/2020   Lipid Panel:  Lab Results  Component Value Date   LDLCALC 85 01/02/2022   HgbA1c:  Lab Results  Component Value Date   HGBA1C 9.4 (A) 12/31/2022   Urine Drug Screen:     Component Value Date/Time   LABOPIA NONE DETECTED 10/20/2014 0220   COCAINSCRNUR NONE DETECTED 10/20/2014 0220   LABBENZ NONE DETECTED 10/20/2014 0220   AMPHETMU NONE DETECTED 10/20/2014 0220   THCU NONE DETECTED 10/20/2014 0220   LABBARB NONE DETECTED 10/20/2014 0220    Alcohol Level     Component Value Date/Time   ETH <5 10/20/2014 0120    CT Head without contrast(Personally reviewed): CTH was negative for a large hypodensity concerning for a large territory infarct or hyperdensity concerning for an ICH  CT angio  Head and Neck with contrast(Personally reviewed): No LVO  MRI Brain(Personally reviewed): 5mm L frontal corona radiata stroke along with a late subacute R BG stroke  Impression   Govanny Etheridge Washabaugh is a 82 y.o. male with PMH significant for HTN, DM2, HLD who presents with about 2 day hx of L sided bells palsy. The noted stroke on MRI are incidental and do not explain the noted bells palsy. No vesicles in the L ear, no tongue fissuring.  Recommendations  L bells palsy: - Valacyclovir 1000mg  TID x 7 days - Prednisone 60mg  daily x 7 days - Lubricating eye drops as needed - ointment formulation or artificial tears at bedtime - Tape eye shut to prevent dryness - STAT Follow up with ophthalmology within 1 week of discharge to monitor for corneal/scleral clouding.  Incidental small vessel strokes: - Frequent Neuro checks per stroke unit protocol - Recommend Vascular imaging with CTA head and neck - Recommend obtaining TTE - Recommend  obtaining Lipid panel with LDL - Please start statin if LDL > 70 - Recommend HbA1c to evaluate for diabetes and how well it is controlled. - Antithrombotic - Aspirin 81mg  daily - Recommend DVT ppx - SBP goal - aim for gradual normotension. - Recommend Telemetry monitoring for arrythmia - Recommend bedside swallow screen prior to PO intake. - Stroke education booklet - Recommend PT/OT/SLP consult  ______________________________________________________________________   Thank you for the opportunity to take part in the care of this patient. If you have any further questions, please contact the neurology consultation attending.  Signed,  Erick Blinks [facility] _ _ _   _ __   _ __ _ _  __ __   _ __   __ _

## 2023-03-15 NOTE — Progress Notes (Signed)
  Echocardiogram 2D Echocardiogram has been performed.  Delcie Roch 03/15/2023, 10:19 AM

## 2023-03-15 NOTE — Evaluation (Signed)
Speech Language Pathology Evaluation Patient Details Name: Scott Vasquez MRN: [id number] DOB: [birth year] Today's Date: 03/15/2023 Time: 1150-1210 SLP Time Calculation (min) (ACUTE ONLY): 20 min  Problem List:  Patient Active Problem List   Diagnosis Date Noted   Acute ischemic stroke (HCC) 03/15/2023   Aphasia 03/12/2023   Hypothyroidism 01/02/2022   CKD (chronic kidney disease), stage III (HCC) 01/01/2022   Stroke (HCC) 01/01/2022   Mild dementia without behavioral disturbance, psychotic disturbance, mood disturbance, or anxiety (HCC) 05/18/2019   Thyroid mass 04/29/2015   DM2 (diabetes mellitus, type 2) (HCC)    Acute lacunar stroke (HCC)    Stroke with cerebral ischemia (HCC)    Thalamic infarct, acute (HCC) 10/20/2014   PERSONAL HX OF METHICILLIN RESIST STAPH AUREUS 09/17/2008   Hyperlipidemia 04/01/2007   B12 deficiency 02/08/2007   Iron deficiency anemia 02/08/2007   Essential hypertension 02/08/2007   DIVERTICULOSIS, COLON 02/08/2007   CEREBROVASCULAR ACCIDENT, HX OF 02/08/2007   Past Medical History:  Past Medical History:  Diagnosis Date   Anemia    iron deficiency   Diabetes mellitus    type II   Diverticulosis of colon    Hypertension    Stroke (HCC)    numbness in left hand   Thyroid nodule    Past Surgical History:  Past Surgical History:  Procedure Laterality Date   BACK SURGERY     CATARACT EXTRACTION, BILATERAL     SHOULDER SURGERY  08/2007   Supple   THYROID LOBECTOMY Left 04/29/2015   THYROID LOBECTOMY Left 04/29/2015   Procedure: THYROID LOBECTOMY;  Surgeon: Darnell Level, MD;  Location[facility] OR;  Service: General;  Laterality: Left;   HPI:  Scott Vasquez is an 82 yo male presenting to ED 8/5 with L sided facial droop and generalized weakness. MRI Brain with acute ischemic nonhemorrhagic infarct involving L frontal corona radiata, subtle diffusion likely representative of an evolving late subacute infarct in anterior R basal ganglia, and underlying age  related cerebral atrophy with multiple remote lacunar infarcts involving the hemispheric cerebral white matter, deep gray nuclei, and pons. Seen by SLP in 2016 and cognitiona nd speech were WFL, although note baseline memory deficits. PMH includes moderate dementia, anemia, T2DM, diverticulosis, HTN, thyroid nodule, prior CVA   Assessment / Plan / Recommendation Clinical Impression  Pt initially reported no acute concern for speech or cognition, although at the end of the session reported he feels significantly different from baseline. He states that he lives at home with his wife, where he handles medications and finances independently, although his nephew disputes this but declined to elaborate. Pt presents with a significant L sided facial droop, resulting in dysarthria which affects intelligibility. He scored a 6/30 on the SLUMS (a score of 27 or above is considered WFL) characterized by deficits in memory, orientation, attention, awareness, and problem solving. He demonstrated a signficantly delayed initiation throughout the session, intermittently requiring cueing to respond or ask for repetitions. In one minute, pt named one animal. He was unable to complete the task of repeating a series of numbers backwards and was not accurate when SLP prompted him to repeat them in the same order. Pt exhibits impaired spatial awareness and motor planning evidenced by the clock task. Pt reports L sided facial droop and changes in cognition are different from baseline, even after prior CVA. Although pt has history of dementia, supect presentation is representative of acute differences. Will likely benefit from continued ST to target these deficits through functional tasks  as well as intensive post acute rehabilitation. SLP will continue to f/u acutely.    SLP Assessment  SLP Recommendation/Assessment: Patient needs continued Speech Lanaguage Pathology Services SLP Visit Diagnosis: Dysarthria and anarthria  (R47.1);Cognitive communication deficit (R41.841)    Recommendations for follow up therapy are one component of a multi-disciplinary discharge planning process, led by the attending physician.  Recommendations may be updated based on patient status, additional functional criteria and insurance authorization.    Follow Up Recommendations  Acute inpatient rehab (3hours/day)    Assistance Recommended at Discharge  Frequent or constant Supervision/Assistance  Functional Status Assessment Patient has had a recent decline in their functional status and demonstrates the ability to make significant improvements in function in a reasonable and predictable amount of time.  Frequency and Duration min 2x/week  2 weeks      SLP Evaluation Cognition  Overall Cognitive Status: Impaired/Different from baseline Arousal/Alertness: Awake/alert Orientation Level: Oriented to person;Oriented to place;Oriented to situation;Disoriented to time Year: Other (Comment) (2028) Attention: Sustained Sustained Attention: Impaired Sustained Attention Impairment: Verbal basic Memory: Impaired Memory Impairment: Storage deficit;Retrieval deficit Awareness: Impaired Awareness Impairment: Intellectual impairment Problem Solving: Impaired Problem Solving Impairment: Verbal basic       Comprehension  Auditory Comprehension Overall Auditory Comprehension: Appears within functional limits for tasks assessed    Expression Expression Primary Mode of Expression: Verbal Verbal Expression Overall Verbal Expression: Impaired Repetition: Impaired Level of Impairment: Word level Naming: Impairment Divergent: 50-74% accurate Written Expression Dominant Hand: Right   Oral / Motor  Oral Motor/Sensory Function Overall Oral Motor/Sensory Function: Moderate impairment Facial ROM: Reduced left Facial Symmetry: Abnormal symmetry left;Suspected CN VII (facial) dysfunction Facial Strength: Reduced left;Suspected CN VII  (facial) dysfunction Facial Sensation: Reduced left;Suspected CN V (Trigeminal) dysfunction Lingual ROM: Reduced left;Suspected CN XII (hypoglossal) dysfunction Lingual Symmetry: Abnormal symmetry left;Suspected CN XII (hypoglossal) dysfunction Lingual Strength: Suspected CN XII (hypoglossal) dysfunction Lingual Sensation: Suspected CN VII (facial) dysfunction-anterior 2/3 tongue Velum: Impaired left;Suspected CN X (Vagus) dysfunction Motor Speech Overall Motor Speech: Impaired Respiration: Within functional limits Phonation: Normal Resonance: Within functional limits Articulation: Impaired Level of Impairment: Sentence Intelligibility: Intelligibility reduced Sentence: 50-74% accurate Motor Planning: Witnin functional limits            Gwynneth Aliment, M.A., CF-SLP Speech Language Pathology, Acute Rehabilitation Services  Secure Chat preferred [phone]  03/15/2023, 12:57 PM

## 2023-03-15 NOTE — Telephone Encounter (Signed)
Patient was admitted to [facility] yesterday for CVA

## 2023-03-15 NOTE — Assessment & Plan Note (Signed)
>>  ASSESSMENT AND PLAN FOR ACUTE ISCHEMIC STROKE (HCC) WRITTEN ON 03/15/2023  1:34 AM BY GARDNER, JARED M, DO  Pt with puncatate acute stroke to L corona radiata and looks like a subacute stroke? To R basal ganglia. L facial weakness may be related to R stroke?, or may be due to a Bell's palsy unrelated to stroke(s)? Involvement of forehead usually would suggest Bell's palsy.  And the acute L corona radiata stroke is on the wrong hemisphere to be causing a L sided weakness symptoms... -Neuro looking into this for explanation. Regardless, acute stroke(s) demonstrated on MRI in patient with risk factors including multiple prior strokes: so needs admit, stroke work up, risk factor modification, etc. Stroke pathway Tele monitor 2d echo CTA head and neck PT/OT/SLP Neuro consulting

## 2023-03-15 NOTE — Care Management Obs Status (Signed)
MEDICARE OBSERVATION STATUS NOTIFICATION   Patient Details  Name: Scott Vasquez MRN: [id number] Date of Birth: September 08, 1940   Medicare Observation Status Notification Given:  Yes    Ronny Bacon, RN 03/15/2023, 4:23 PM

## 2023-03-15 NOTE — Assessment & Plan Note (Addendum)
Hold home PO hypoglycemics Mod scale SSI AC

## 2023-03-15 NOTE — H&P (Signed)
History and Physical    Patient: Scott Vasquez ZOX:[id number] DOB: 25-May-1941 DOA: 03/14/2023 DOS: the patient was seen and examined on 03/15/2023 PCP: [name], NP  Patient coming from: Home  Chief Complaint:  Chief Complaint  Patient presents with   Weakness   HPI: Scott Vasquez is a 82 y.o. male with medical history significant of prior strokes, dementia (vascular?), DM2, HTN.  Pt in to ED with c/o L sided facial droop, also generalized weakness.  This apparently episodic starting Friday.  Another episode earlier today which had resolved, but then current facial droop onset at ~4pm and have been persistent.  Pt in to ED due to concern for recurrent stroke.   Review of Systems: As mentioned in the history of present illness. All other systems reviewed and are negative. Past Medical History:  Diagnosis Date   Anemia    iron deficiency   Diabetes mellitus    type II   Diverticulosis of colon    Hypertension    Stroke (HCC)    numbness in left hand   Thyroid nodule    Past Surgical History:  Procedure Laterality Date   BACK SURGERY     CATARACT EXTRACTION, BILATERAL     SHOULDER SURGERY  08/2007   Supple   THYROID LOBECTOMY Left 04/29/2015   THYROID LOBECTOMY Left 04/29/2015   Procedure: THYROID LOBECTOMY;  Surgeon: Darnell Level, MD;  Location[facility] OR;  Service: General;  Laterality: Left;   Social History:  reports that he quit smoking about 64 years ago. His smoking use included cigarettes. He started smoking about 71 years ago. He has a 10 pack-year smoking history. He has never used smokeless tobacco. He reports that he does not drink alcohol and does not use drugs.  Allergies  Allergen Reactions   Actos [Pioglitazone] Other (See Comments)    Diarrhea    Metformin And Related Other (See Comments)    Diarrhea      Family History  Problem Relation Age of Onset   Diabetes Other    Diabetes Father        Died from diabetic coma in 04/07/1971   Hypertension Father     Stroke Mother        Died in 25   Hypertension Mother     Prior to Admission medications   Medication Sig Start Date End Date Taking? Authorizing Provider  aspirin 81 MG chewable tablet Chew 1 tablet (81 mg total) by mouth daily. 01/04/22  Yes Tyrone Nine, MD  atorvastatin (LIPITOR) 80 MG tablet TAKE 1 TABLET EVERY DAY 02/10/23  Yes Nafziger, Kandee Keen, NP  citalopram (CELEXA) 20 MG tablet TAKE 1 TABLET EVERY DAY 09/28/22  Yes Marcos Eke, PA-C  donepezil (ARICEPT) 10 MG tablet TAKE 1 TABLET AT BEDTIME 09/28/22  Yes Wertman, Sung Amabile, PA-C  Dulaglutide (TRULICITY) 1.5 MG/0.5ML SOPN Inject 1.5 mg into the skin once a week. Patient taking differently: Inject 1.5 mg into the skin once a week. Friday 12/31/22 03/31/23 Yes Nafziger, Kandee Keen, NP  glipiZIDE (GLUCOTROL XL) 10 MG 24 hr tablet Take 1 tablet (10 mg total) by mouth daily with breakfast. 12/31/22  Yes Nafziger, Kandee Keen, NP  levothyroxine (SYNTHROID) 50 MCG tablet TAKE 1 TABLET EVERY DAY 09/24/22  Yes Nafziger, Kandee Keen, NP  lisinopril (ZESTRIL) 40 MG tablet TAKE 1 TABLET EVERY DAY 09/24/22  Yes Nafziger, Kandee Keen, NP  memantine (NAMENDA) 10 MG tablet TAKE 1 TABLET TWICE DAILY Patient taking differently: Take 10 mg by mouth 2 (two)  times daily. 09/28/22  Yes Wertman, Sung Amabile, PA-C  Vibegron (GEMTESA) 75 MG TABS Take 75 mg by mouth daily.   Yes [provider]  ACCU-CHEK AVIVA PLUS test strip TEST BLOOD SUGAR TWICE DAILY 03/17/22   Nafziger, Kandee Keen, NP  Blood Glucose Monitoring Suppl (ACCU-CHEK GUIDE) w/Device KIT USE AS DIRECTED 03/17/22   Nafziger, Kandee Keen, NP  Insulin Pen Needle (PEN NEEDLES) 31G X 5 MM MISC Use with insulin pen 12/31/22   [name], NP    Physical Exam: Vitals:   03/14/23 2104 03/14/23 2325 03/15/23 0020 03/15/23 0233  BP:  (!) 191/82 (!) 194/87 (!) 156/69  Pulse:  (!) 57 (!) 59 (!) 53  Resp:  17 17 16   Temp:   98.3 F (36.8 C)   TempSrc:   Oral   SpO2:  100% 100% 95%  Weight: 84.8 kg     Height: 5\' 10"  (1.778 m)       Constitutional: NAD, calm, comfortable Respiratory: clear to auscultation bilaterally, no wheezing, no crackles. Normal respiratory effort. No accessory muscle use.  Cardiovascular: Regular rate and rhythm, no murmurs / rubs / gallops. No extremity edema. 2+ pedal pulses. No carotid bruits.  Abdomen: no tenderness, no masses palpated. No hepatosplenomegaly. Bowel sounds positive.  Neurologic: L sided facial droop, but also involves upper face / eyebrows as well. Psychiatric: Mild-mod dementia, at baseline per family member at bedside.  Data Reviewed:    Labs on Admission: I have personally reviewed following labs and imaging studies  CBC: Recent Labs  Lab 03/14/23 1654  WBC 6.1  HGB 10.1*  HCT 31.8*  MCV 91.6  PLT 192   Basic Metabolic Panel: Recent Labs  Lab 03/14/23 1654  NA 140  K 3.9  CL 104  CO2 30  GLUCOSE 106*  BUN 13  CREATININE 1.32*  CALCIUM 8.9   GFR: Estimated Creatinine Clearance: 43.8 mL/min (A) (by C-G formula based on SCr of 1.32 mg/dL (H)). Liver Function Tests: No results for input(s): "AST", "ALT", "ALKPHOS", "BILITOT", "PROT", "ALBUMIN" in the last 168 hours. No results for input(s): "LIPASE", "AMYLASE" in the last 168 hours. No results for input(s): "AMMONIA" in the last 168 hours. Coagulation Profile: No results for input(s): "INR", "PROTIME" in the last 168 hours. Cardiac Enzymes: No results for input(s): "CKTOTAL", "CKMB", "CKMBINDEX", "TROPONINI" in the last 168 hours. BNP (last 3 results) No results for input(s): "PROBNP" in the last 8760 hours. HbA1C: No results for input(s): "HGBA1C" in the last 72 hours. CBG: Recent Labs  Lab 03/14/23 1637 03/14/23 2256 03/14/23 2319 03/14/23 2335 03/15/23 0015  GLUCAP 104* 51* 64* 81 96   Lipid Profile: No results for input(s): "CHOL", "HDL", "LDLCALC", "TRIG", "CHOLHDL", "LDLDIRECT" in the last 72 hours. Thyroid Function Tests: No results for input(s): "TSH", "T4TOTAL", "FREET4",  "T3FREE", "THYROIDAB" in the last 72 hours. Anemia Panel: No results for input(s): "VITAMINB12", "FOLATE", "FERRITIN", "TIBC", "IRON", "RETICCTPCT" in the last 72 hours. Urine analysis:    Component Value Date/Time   COLORURINE YELLOW 03/14/2023 1656   APPEARANCEUR CLEAR 03/14/2023 1656   LABSPEC 1.016 03/14/2023 1656   PHURINE 6.0 03/14/2023 1656   GLUCOSEU NEGATIVE 03/14/2023 1656   HGBUR NEGATIVE 03/14/2023 1656   BILIRUBINUR NEGATIVE 03/14/2023 1656   BILIRUBINUR Neg 07/24/2016 1038   KETONESUR NEGATIVE 03/14/2023 1656   PROTEINUR NEGATIVE 03/14/2023 1656   UROBILINOGEN negative 07/24/2016 1038   UROBILINOGEN 0.2 10/20/2014 0220   NITRITE NEGATIVE 03/14/2023 1656   LEUKOCYTESUR MODERATE (A) 03/14/2023 1656  Radiological Exams on Admission: MR BRAIN WO CONTRAST  Result Date: 03/15/2023 CLINICAL DATA:  Initial evaluation for facial droop, confusion. EXAM: MRI HEAD WITHOUT CONTRAST TECHNIQUE: Multiplanar, multiecho pulse sequences of the brain and surrounding structures were obtained without intravenous contrast. COMPARISON:  Prior CT from 03/14/2023 as well as previous MRI from 01/01/2022. FINDINGS: Brain: Generalized age-related cerebral atrophy. Patchy and confluent T2/FLAIR hyperintensity involving the periventricular deep white matter both cerebral hemispheres as well as the pons, consistent with chronic small vessel ischemic disease, advanced in nature. Multiple remote lacunar infarcts present about the hemispheric cerebral white matter, deep gray nuclei, and pons. 5 mm focus of restricted diffusion involving the left frontal corona radiata, consistent with a small acute ischemic small-vessel infarct (series 5, image 79). No associated hemorrhage or mass effect. Subtle diffusion signal abnormality about the contralateral anterior right basal ganglia noted, likely an evolving late subacute ischemic change (series 5, image 78). No other evidence for acute or subacute ischemia.  Gray-white matter differentiation otherwise maintained. No acute intracranial hemorrhage. Multiple scattered chronic micro hemorrhages noted clustered about the brainstem and deep gray nuclei, most characteristic of chronic poorly controlled hypertension. No mass lesion, midline shift or mass effect. No hydrocephalus or extra-axial fluid collection. Pituitary gland and suprasellar region within normal limits. Vascular: Major intracranial vascular flow voids are maintained. Skull and upper cervical spine: Craniocervical junction within normal limits. Bone marrow signal intensity within normal limits. No scalp soft tissue abnormality. Sinuses/Orbits: Prior bilateral ocular lens replacement. Paranasal sinuses are clear. No significant mastoid effusion. Other: None. IMPRESSION: 1. 5 mm acute ischemic nonhemorrhagic infarct involving the left frontal corona radiata. 2. Subtle diffusion signal abnormality about the contralateral anterior right basal ganglia, likely an evolving late subacute ischemic infarct. 3. Underlying age-related cerebral atrophy with advanced chronic microvascular ischemic disease, with multiple remote lacunar infarcts involving the hemispheric cerebral white matter, deep gray nuclei, and pons. 4. Multiple scattered chronic micro hemorrhages clustered about the brainstem and deep gray nuclei, most characteristic of chronic poorly controlled hypertension. Electronically Signed   By: Rise Mu M.D.   On: 03/15/2023 00:25   CT Head Wo Contrast  Result Date: 03/14/2023 CLINICAL DATA:  Neuro deficit, acute, stroke suspected EXAM: CT HEAD WITHOUT CONTRAST TECHNIQUE: Contiguous axial images were obtained from the base of the skull through the vertex without intravenous contrast. RADIATION DOSE REDUCTION: This exam was performed according to the departmental dose-optimization program which includes automated exposure control, adjustment of the mA and/or kV according to patient size and/or use  of iterative reconstruction technique. COMPARISON:  Brain MR 01/01/2022 FINDINGS: Brain: No evidence of acute infarction, hemorrhage, hydrocephalus, extra-axial collection or mass lesion/mass effect. There is sequela of severe chronic microvascular ischemic change. 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 Vascular: No hyperdense vessel or unexpected calcification. Skull: Normal. Negative for fracture or focal lesion. Sinuses/Orbits: No middle ear or mastoid effusion. Paranasal sinuses are clear. Bilateral lens replacement. Orbits are otherwise unremarkable. Other: None. IMPRESSION: 1. No hemorrhage or CT evidence of an acute cortical infarct. 2. Sequela of severe chronic microvascular ischemic change. 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 Electronically Signed   By: Lorenza Cambridge M.D.   On: 03/14/2023 17:08    EKG: Independently reviewed.   Assessment and Plan: * Acute ischemic stroke (HCC) Pt with puncatate acute stroke to L corona radiata and looks like a subacute stroke? To R basal ganglia. L facial weakness may be related to R stroke?, or may be due to a Bell's  palsy unrelated to stroke(s)? Involvement of forehead usually would suggest Bell's palsy.  And the acute L corona radiata stroke is on the wrong hemisphere to be causing a L sided weakness symptoms... -Neuro looking into this for explanation. Regardless, acute stroke(s) demonstrated on MRI in patient with risk factors including multiple prior strokes: so needs admit, stroke work up, risk factor modification, etc. Stroke pathway Tele monitor 2d echo CTA head and neck PT/OT/SLP Neuro consulting  Mild dementia without behavioral disturbance, psychotic disturbance, mood disturbance, or anxiety (HCC) History of mild to moderate dementia at this stage per notes and family. Cont home meds once med rec is completed.  DM2 (diabetes mellitus, type 2) (HCC) Hold home PO hypoglycemics Mod scale SSI AC  Essential  hypertension Hold home BP meds and allow permissive HTN in setting of acute stroke. Treat if SBP > 220 or DBP > 120  Hyperlipidemia Cont Statin Check lipid panel ? If addition of PCSK9 might help prevent ongoing strokes?      Advance Care Planning:   Code Status: Full Code  Consults: Neuro  Family Communication: Family at bedside.  Severity of Illness: The appropriate patient status for this patient is OBSERVATION. Observation status is judged to be reasonable and necessary in order to provide the required intensity of service to ensure the patient's safety. The patient's presenting symptoms, physical exam findings, and initial radiographic and laboratory data in the context of their medical condition is felt to place them at decreased risk for further clinical deterioration. Furthermore, it is anticipated that the patient will be medically stable for discharge from the hospital within 2 midnights of admission.   Author: Hillary Bow., DO 03/15/2023 2:54 AM  For on call review [email or web address].

## 2023-03-15 NOTE — ED Notes (Signed)
ED TO INPATIENT HANDOFF REPORT  ED Nurse Name and Phone #: Alycia Rossetti [phone]  S Name/Age/Gender Scott Vasquez 82 y.o. male Room/Bed: 009C/009C  Code Status   Code Status: Full Code  Home/SNF/Other Home Patient oriented to: self, place, and situation Is this baseline? Yes   Triage Complete: Triage complete  Chief Complaint Acute ischemic stroke [name]) [I63.9]  Triage Note Weakness since Friday "mouth twisting" on and off   "mouth twisting" ( facial droop) now Face normal at home. 4pm     Allergies Allergies  Allergen Reactions   Actos [Pioglitazone] Other (See Comments)    Diarrhea    Metformin And Related Other (See Comments)    Diarrhea      Level of Care/Admitting Diagnosis ED Disposition     ED Disposition  Admit   Condition  --   Comment  Hospital Area: [facility] [[id number]]  Level of Care: Telemetry Medical [104]  May place patient in observation at [facility] or [facility] if equivalent level of care is available:: No  Covid Evaluation: Asymptomatic - no recent exposure (last 10 days) testing not required  Diagnosis: Acute ischemic stroke [name]) [[id number]]  Admitting Physician: Hillary Bow [phone]  Attending Physician: Hillary Bow [phone]          B Medical/Surgery History Past Medical History:  Diagnosis Date   Anemia    iron deficiency   Diabetes mellitus    type II   Diverticulosis of colon    Hypertension    Stroke (HCC)    numbness in left hand   Thyroid nodule    Past Surgical History:  Procedure Laterality Date   BACK SURGERY     CATARACT EXTRACTION, BILATERAL     SHOULDER SURGERY  08/2007   Supple   THYROID LOBECTOMY Left 04/29/2015   THYROID LOBECTOMY Left 04/29/2015   Procedure: THYROID LOBECTOMY;  Surgeon: Darnell Level, MD;  Location: [facility] OR;  Service: General;  Laterality: Left;     A IV Location/Drains/Wounds Patient Lines/Drains/Airways Status     Active Line/Drains/Airways     Name Placement  date Placement time Site Days   Peripheral IV 03/14/23 20 G Right Antecubital 03/14/23  1646  Antecubital  1   Closed System Drain 1 Left Neck Bulb (JP) 7 Fr. 04/29/15  1153  Neck  2877   Incision (Closed) 04/29/15 Neck 04/29/15  1206  -- 2877            Intake/Output Last 24 hours No intake or output data in the 24 hours ending 03/15/23 0218  Labs/Imaging Results for orders placed or performed during the hospital encounter of 03/14/23 (from the past 48 hour(s))  CBG monitoring, ED     Status: Abnormal   Collection Time: 03/14/23  4:37 PM  Result Value Ref Range   Glucose-Capillary 104 (H) 70 - 99 mg/dL    Comment: Glucose reference range applies only to samples taken after fasting for at least 8 hours.  Basic metabolic panel     Status: Abnormal   Collection Time: 03/14/23  4:54 PM  Result Value Ref Range   Sodium 140 135 - 145 mmol/L   Potassium 3.9 3.5 - 5.1 mmol/L   Chloride 104 98 - 111 mmol/L   CO2 30 22 - 32 mmol/L   Glucose, Bld 106 (H) 70 - 99 mg/dL    Comment: Glucose reference range applies only to samples taken after fasting for at least 8 hours.   BUN 13  8 - 23 mg/dL   Creatinine, Ser 1.61 (H) 0.61 - 1.24 mg/dL   Calcium 8.9 8.9 - 09.6 mg/dL   GFR, Estimated 54 (L) >60 mL/min    Comment: (NOTE) Calculated using the CKD-EPI Creatinine Equation (2021)    Anion gap 6 5 - 15    Comment: Performed at [facility], [address], [place], [place] [address]  CBC     Status: Abnormal   Collection Time: 03/14/23  4:54 PM  Result Value Ref Range   WBC 6.1 4.0 - 10.5 K/uL   RBC 3.47 (L) 4.22 - 5.81 MIL/uL   Hemoglobin 10.1 (L) 13.0 - 17.0 g/dL   HCT 98.1 (L) 19.1 - 47.8 %   MCV 91.6 80.0 - 100.0 fL   MCH 29.1 26.0 - 34.0 pg   MCHC 31.8 30.0 - 36.0 g/dL   RDW 29.5 62.1 - 30.8 %   Platelets 192 150 - 400 K/uL   nRBC 0.0 0.0 - 0.2 %    Comment: Performed at [facility], [address] [place], [place] [address]   Urinalysis, w/ Reflex to Culture (Infection Suspected) -Urine, Unspecified Source     Status: Abnormal   Collection Time: 03/14/23  4:56 PM  Result Value Ref Range   Specimen Source URINE, UNSPE    Color, Urine YELLOW YELLOW   APPearance CLEAR CLEAR   Specific Gravity, Urine 1.016 1.005 - 1.030   pH 6.0 5.0 - 8.0   Glucose, UA NEGATIVE NEGATIVE mg/dL   Hgb urine dipstick NEGATIVE NEGATIVE   Bilirubin Urine NEGATIVE NEGATIVE   Ketones, ur NEGATIVE NEGATIVE mg/dL   Protein, ur NEGATIVE NEGATIVE mg/dL   Nitrite NEGATIVE NEGATIVE   Leukocytes,Ua MODERATE (A) NEGATIVE   RBC / HPF 0-5 0 - 5 RBC/hpf   WBC, UA 11-20 0 - 5 WBC/hpf    Comment:        Reflex urine culture not performed if WBC <=10, OR if Squamous epithelial cells >5. If Squamous epithelial cells >5 suggest recollection.    Bacteria, UA RARE (A) NONE SEEN   Squamous Epithelial / HPF 0-5 0 - 5 /HPF    Comment: Performed at [facility], [address] [place], [place] [address]  POC CBG, ED     Status: Abnormal   Collection Time: 03/14/23 10:56 PM  Result Value Ref Range   Glucose-Capillary 51 (L) 70 - 99 mg/dL    Comment: Glucose reference range applies only to samples taken after fasting for at least 8 hours.  CBG monitoring, ED     Status: Abnormal   Collection Time: 03/14/23 11:19 PM  Result Value Ref Range   Glucose-Capillary 64 (L) 70 - 99 mg/dL    Comment: Glucose reference range applies only to samples taken after fasting for at least 8 hours.  CBG monitoring, ED     Status: None   Collection Time: 03/14/23 11:35 PM  Result Value Ref Range   Glucose-Capillary 81 70 - 99 mg/dL    Comment: Glucose reference range applies only to samples taken after fasting for at least 8 hours.  CBG monitoring, ED     Status: None   Collection Time: 03/15/23 12:15 AM  Result Value Ref Range   Glucose-Capillary 96 70 - 99 mg/dL    Comment: Glucose reference range applies only to samples taken after fasting  for at least 8 hours.   MR BRAIN WO CONTRAST  Result Date: 03/15/2023 CLINICAL DATA:  Initial evaluation for facial  droop, confusion. EXAM: MRI HEAD WITHOUT CONTRAST TECHNIQUE: Multiplanar, multiecho pulse sequences of the brain and surrounding structures were obtained without intravenous contrast. COMPARISON:  Prior CT from 03/14/2023 as well as previous MRI from 01/01/2022. FINDINGS: Brain: Generalized age-related cerebral atrophy. Patchy and confluent T2/FLAIR hyperintensity involving the periventricular deep white matter both cerebral hemispheres as well as the pons, consistent with chronic small vessel ischemic disease, advanced in nature. Multiple remote lacunar infarcts present about the hemispheric cerebral white matter, deep gray nuclei, and pons. 5 mm focus of restricted diffusion involving the left frontal corona radiata, consistent with a small acute ischemic small-vessel infarct (series 5, image 79). No associated hemorrhage or mass effect. Subtle diffusion signal abnormality about the contralateral anterior right basal ganglia noted, likely an evolving late subacute ischemic change (series 5, image 78). No other evidence for acute or subacute ischemia. Gray-white matter differentiation otherwise maintained. No acute intracranial hemorrhage. Multiple scattered chronic micro hemorrhages noted clustered about the brainstem and deep gray nuclei, most characteristic of chronic poorly controlled hypertension. No mass lesion, midline shift or mass effect. No hydrocephalus or extra-axial fluid collection. Pituitary gland and suprasellar region within normal limits. Vascular: Major intracranial vascular flow voids are maintained. Skull and upper cervical spine: Craniocervical junction within normal limits. Bone marrow signal intensity within normal limits. No scalp soft tissue abnormality. Sinuses/Orbits: Prior bilateral ocular lens replacement. Paranasal sinuses are clear. No significant mastoid effusion.  Other: None. IMPRESSION: 1. 5 mm acute ischemic nonhemorrhagic infarct involving the left frontal corona radiata. 2. Subtle diffusion signal abnormality about the contralateral anterior right basal ganglia, likely an evolving late subacute ischemic infarct. 3. Underlying age-related cerebral atrophy with advanced chronic microvascular ischemic disease, with multiple remote lacunar infarcts involving the hemispheric cerebral white matter, deep gray nuclei, and pons. 4. Multiple scattered chronic micro hemorrhages clustered about the brainstem and deep gray nuclei, most characteristic of chronic poorly controlled hypertension. Electronically Signed   By: Rise Mu M.D.   On: 03/15/2023 00:25   CT Head Wo Contrast  Result Date: 03/14/2023 CLINICAL DATA:  Neuro deficit, acute, stroke suspected EXAM: CT HEAD WITHOUT CONTRAST TECHNIQUE: Contiguous axial images were obtained from the base of the skull through the vertex without intravenous contrast. RADIATION DOSE REDUCTION: This exam was performed according to the departmental dose-optimization program which includes automated exposure control, adjustment of the mA and/or kV according to patient size and/or use of iterative reconstruction technique. COMPARISON:  Brain MR 01/01/2022 FINDINGS: Brain: No evidence of acute infarction, hemorrhage, hydrocephalus, extra-axial collection or mass lesion/mass effect. There is sequela of severe chronic microvascular ischemic change. 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 Vascular: No hyperdense vessel or unexpected calcification. Skull: Normal. Negative for fracture or focal lesion. Sinuses/Orbits: No middle ear or mastoid effusion. Paranasal sinuses are clear. Bilateral lens replacement. Orbits are otherwise unremarkable. Other: None. IMPRESSION: 1. No hemorrhage or CT evidence of an acute cortical infarct. 2. Sequela of severe chronic microvascular ischemic change. Likely chronic infarcts in the bilateral  thalami. Electronically Signed   By: Lorenza Cambridge M.D.   On: 03/14/2023 17:08    Pending Labs Unresulted Labs (From admission, onward)     Start     Ordered   03/15/23 0500  Lipid panel  (Labs)  Tomorrow morning,   R       Comments: Fasting    03/15/23 0125   03/14/23 1656  Urine Culture  Once,   R        03/14/23 1656  Vitals/Pain Today's Vitals   03/14/23 2102 03/14/23 2104 03/14/23 2325 03/15/23 0020  BP: (!) 169/87  (!) 191/82 (!) 194/87  Pulse: 60  (!) 57 (!) 59  Resp: 18  17 17   Temp: 98.4 F (36.9 C)   98.3 F (36.8 C)  TempSrc: Oral   Oral  SpO2: 98%  100% 100%  Weight:  84.8 kg    Height:  5\' 10"  (1.778 m)    PainSc:        Isolation Precautions No active isolations  Medications Medications   stroke: early stages of recovery book (has no administration in time range)  acetaminophen (TYLENOL) tablet 650 mg (has no administration in time range)    Or  acetaminophen (TYLENOL) 160 MG/5ML solution 650 mg (has no administration in time range)    Or  acetaminophen (TYLENOL) suppository 650 mg (has no administration in time range)  enoxaparin (LOVENOX) injection 40 mg (has no administration in time range)  aspirin EC tablet 81 mg (has no administration in time range)  clopidogrel (PLAVIX) tablet 75 mg (has no administration in time range)  atorvastatin (LIPITOR) tablet 80 mg (has no administration in time range)  insulin aspart (novoLOG) injection 0-15 Units (has no administration in time range)  insulin glargine-yfgn (SEMGLEE) injection 10 Units (has no administration in time range)  lisinopril (ZESTRIL) tablet 40 mg (40 mg Oral Given 03/14/23 2333)    Mobility walks     Focused Assessments Neuro Assessment Handoff:  Swallow screen pass? Yes  Cardiac Rhythm: Normal sinus rhythm NIH Stroke Scale  Dizziness Present: No Headache Present: No Interval: Initial Level of Consciousness (1a.)   : Alert, keenly responsive LOC Questions (1b. )   :  Answers one question correctly LOC Commands (1c. )   : Performs both tasks correctly Best Gaze (2. )  : Normal Visual (3. )  : No visual loss Facial Palsy (4. )    : Partial paralysis  Motor Arm, Left (5a. )   : No drift Motor Arm, Right (5b. ) : No drift Motor Leg, Left (6a. )  : No drift Motor Leg, Right (6b. ) : No drift Limb Ataxia (7. ): Absent Sensory (8. )  : Normal, no sensory loss Best Language (9. )  : Mild-to-moderate aphasia Dysarthria (10. ): Mild-to-moderate dysarthria, patient slurs at least some words and, at worst, can be understood with some difficulty Extinction/Inattention (11.)   : No Abnormality Complete NIHSS TOTAL: 5     Neuro Assessment: Exceptions to WDL Neuro Checks:   Initial (03/14/23 1644)  Has TPA been given? No If patient is a Neuro Trauma and patient is going to OR before floor call report to 4N Charge nurse: [phone] or [phone]   R Recommendations: See Admitting Provider Note  Report given to:   Additional Notes:  Pt is pleasant, alert and oriented x3ish. Ambulatory to bathroom with 1 assistance. Here for frontal lobe stroke.

## 2023-03-15 NOTE — Progress Notes (Unsigned)
{  Select_TRH_Note:[id number]} 

## 2023-03-15 NOTE — Telephone Encounter (Signed)
Caller states pt has eye drainage and facial twitching. Caller stated pt mouth was twisted and now straightened

## 2023-03-15 NOTE — Assessment & Plan Note (Addendum)
History of mild to moderate dementia at this stage per notes and family. Cont home meds once med rec is completed.

## 2023-03-18 DIAGNOSIS — I119 Hypertensive heart disease without heart failure: Secondary | ICD-10-CM | POA: Diagnosis not present

## 2023-03-18 DIAGNOSIS — I69328 Other speech and language deficits following cerebral infarction: Secondary | ICD-10-CM | POA: Diagnosis not present

## 2023-03-18 DIAGNOSIS — R202 Paresthesia of skin: Secondary | ICD-10-CM | POA: Diagnosis not present

## 2023-03-18 DIAGNOSIS — F01B0 Vascular dementia, moderate, without behavioral disturbance, psychotic disturbance, mood disturbance, and anxiety: Secondary | ICD-10-CM | POA: Diagnosis not present

## 2023-03-18 DIAGNOSIS — I69398 Other sequelae of cerebral infarction: Secondary | ICD-10-CM | POA: Diagnosis not present

## 2023-03-18 DIAGNOSIS — G510 Bell's palsy: Secondary | ICD-10-CM | POA: Diagnosis not present

## 2023-03-18 DIAGNOSIS — I69392 Facial weakness following cerebral infarction: Secondary | ICD-10-CM | POA: Diagnosis not present

## 2023-03-18 DIAGNOSIS — G319 Degenerative disease of nervous system, unspecified: Secondary | ICD-10-CM | POA: Diagnosis not present

## 2023-03-18 DIAGNOSIS — E119 Type 2 diabetes mellitus without complications: Secondary | ICD-10-CM | POA: Diagnosis not present

## 2023-03-23 ENCOUNTER — Inpatient Hospital Stay: Payer: Medicare HMO | Admitting: Adult Health

## 2023-03-23 NOTE — Progress Notes (Deleted)
   Subjective:    Patient ID: Scott Vasquez, male    DOB: Mar 04, 1941, 82 y.o.   MRN: [id number]  HPI  82 year old male who  has a past medical history of Anemia, Diabetes mellitus, Diverticulosis of colon, Hypertension, Stroke (HCC), and Thyroid nodule.  He presents to the office today for TCM visit   Admit Date 03/14/2023 Discharge Date 03/16/2023  He presented to the hospital for left-sided facial weakness, neurolysed weakness, concern for stroke.  CT of the head without any acute findings.  MRI of the brain showed a 5 mm acute nonhemorrhagic infarct involving the left frontal corneal radiata.   Hospital Course  Acute ischemic stroke  -MRI with left frontal corona radiata stroke.  -CTA of the head and neck was negative for large vessel occlusion but has severe long segment stenosis of the right P2 segment.  2D echo showed LV EF of 55 to 60% with grade 1 diastolic dysfunction. -PT/OT recommended home health PT on discharge -Neurology recommended aspirin and Plavix for 21 days followed by aspirin alone on discharge -Patient will follow-up with neurology outpatient  Left-sided Bell's palsy -Recommended oral cycle of year and high-dose steroids on discharge for 7 days.  Protonix was added to the regimen for GI protection.  Mild dementia without behavioral disturbance, psychiatric disturbance, mood disturbance or anxiety -Renewed on Namenda, Aricept and Celexa  DM type II -Resumed glipizide, Trulicity, and diabetic diet  Hypertension -Continue with lisinopril and amlodipine upon discharge  Hyperlipidemia  -Continued on home dose of Lipitor 80 mg daily  Review of Systems     Objective:   Physical Exam        Assessment & Plan:

## 2023-03-25 DIAGNOSIS — G319 Degenerative disease of nervous system, unspecified: Secondary | ICD-10-CM | POA: Diagnosis not present

## 2023-03-25 DIAGNOSIS — F01B0 Vascular dementia, moderate, without behavioral disturbance, psychotic disturbance, mood disturbance, and anxiety: Secondary | ICD-10-CM | POA: Diagnosis not present

## 2023-03-25 DIAGNOSIS — E119 Type 2 diabetes mellitus without complications: Secondary | ICD-10-CM | POA: Diagnosis not present

## 2023-03-25 DIAGNOSIS — I69328 Other speech and language deficits following cerebral infarction: Secondary | ICD-10-CM | POA: Diagnosis not present

## 2023-03-25 DIAGNOSIS — I119 Hypertensive heart disease without heart failure: Secondary | ICD-10-CM | POA: Diagnosis not present

## 2023-03-25 DIAGNOSIS — I69392 Facial weakness following cerebral infarction: Secondary | ICD-10-CM | POA: Diagnosis not present

## 2023-03-25 DIAGNOSIS — R202 Paresthesia of skin: Secondary | ICD-10-CM | POA: Diagnosis not present

## 2023-03-25 DIAGNOSIS — G510 Bell's palsy: Secondary | ICD-10-CM | POA: Diagnosis not present

## 2023-03-25 DIAGNOSIS — I69398 Other sequelae of cerebral infarction: Secondary | ICD-10-CM | POA: Diagnosis not present

## 2023-03-30 ENCOUNTER — Inpatient Hospital Stay: Payer: Medicare HMO | Admitting: Adult Health

## 2023-03-30 DIAGNOSIS — I119 Hypertensive heart disease without heart failure: Secondary | ICD-10-CM | POA: Diagnosis not present

## 2023-03-30 DIAGNOSIS — E119 Type 2 diabetes mellitus without complications: Secondary | ICD-10-CM | POA: Diagnosis not present

## 2023-03-30 DIAGNOSIS — I69398 Other sequelae of cerebral infarction: Secondary | ICD-10-CM | POA: Diagnosis not present

## 2023-03-30 DIAGNOSIS — I69392 Facial weakness following cerebral infarction: Secondary | ICD-10-CM | POA: Diagnosis not present

## 2023-03-30 DIAGNOSIS — G319 Degenerative disease of nervous system, unspecified: Secondary | ICD-10-CM | POA: Diagnosis not present

## 2023-03-30 DIAGNOSIS — F01B0 Vascular dementia, moderate, without behavioral disturbance, psychotic disturbance, mood disturbance, and anxiety: Secondary | ICD-10-CM | POA: Diagnosis not present

## 2023-03-30 DIAGNOSIS — I69328 Other speech and language deficits following cerebral infarction: Secondary | ICD-10-CM | POA: Diagnosis not present

## 2023-03-30 DIAGNOSIS — G510 Bell's palsy: Secondary | ICD-10-CM | POA: Diagnosis not present

## 2023-03-30 DIAGNOSIS — R202 Paresthesia of skin: Secondary | ICD-10-CM | POA: Diagnosis not present

## 2023-04-01 ENCOUNTER — Telehealth: Payer: Self-pay | Admitting: Adult Health

## 2023-04-01 ENCOUNTER — Encounter: Payer: Self-pay | Admitting: Adult Health

## 2023-04-01 ENCOUNTER — Ambulatory Visit (INDEPENDENT_AMBULATORY_CARE_PROVIDER_SITE_OTHER): Payer: Medicare HMO | Admitting: Adult Health

## 2023-04-01 VITALS — BP 122/78 | HR 79 | Temp 98.3°F | Ht 70.0 in | Wt 178.2 lb

## 2023-04-01 DIAGNOSIS — I10 Essential (primary) hypertension: Secondary | ICD-10-CM | POA: Diagnosis not present

## 2023-04-01 DIAGNOSIS — E119 Type 2 diabetes mellitus without complications: Secondary | ICD-10-CM | POA: Diagnosis not present

## 2023-04-01 DIAGNOSIS — E785 Hyperlipidemia, unspecified: Secondary | ICD-10-CM | POA: Diagnosis not present

## 2023-04-01 DIAGNOSIS — Z7985 Long-term (current) use of injectable non-insulin antidiabetic drugs: Secondary | ICD-10-CM | POA: Diagnosis not present

## 2023-04-01 DIAGNOSIS — G510 Bell's palsy: Secondary | ICD-10-CM | POA: Diagnosis not present

## 2023-04-01 DIAGNOSIS — Z8673 Personal history of transient ischemic attack (TIA), and cerebral infarction without residual deficits: Secondary | ICD-10-CM

## 2023-04-01 DIAGNOSIS — F01A0 Vascular dementia, mild, without behavioral disturbance, psychotic disturbance, mood disturbance, and anxiety: Secondary | ICD-10-CM

## 2023-04-01 DIAGNOSIS — Z7984 Long term (current) use of oral hypoglycemic drugs: Secondary | ICD-10-CM

## 2023-04-01 DIAGNOSIS — I639 Cerebral infarction, unspecified: Secondary | ICD-10-CM | POA: Diagnosis not present

## 2023-04-01 LAB — CBC WITH DIFFERENTIAL/PLATELET
Basophils Absolute: 0 10*3/uL (ref 0.0–0.1)
Basophils Relative: 0.4 % (ref 0.0–3.0)
Eosinophils Absolute: 0.1 10*3/uL (ref 0.0–0.7)
Eosinophils Relative: 1.2 % (ref 0.0–5.0)
HCT: 37.7 % — ABNORMAL LOW (ref 39.0–52.0)
Hemoglobin: 12.1 g/dL — ABNORMAL LOW (ref 13.0–17.0)
Lymphocytes Relative: 16.5 % (ref 12.0–46.0)
Lymphs Abs: 1.5 10*3/uL (ref 0.7–4.0)
MCHC: 31.9 g/dL (ref 30.0–36.0)
MCV: 89.4 fl (ref 78.0–100.0)
Monocytes Absolute: 0.8 10*3/uL (ref 0.1–1.0)
Monocytes Relative: 8.8 % (ref 3.0–12.0)
Neutro Abs: 6.9 10*3/uL (ref 1.4–7.7)
Neutrophils Relative %: 73.1 % (ref 43.0–77.0)
Platelets: 252 10*3/uL (ref 150.0–400.0)
RBC: 4.22 Mil/uL (ref 4.22–5.81)
RDW: 15 % (ref 11.5–15.5)
WBC: 9.4 10*3/uL (ref 4.0–10.5)

## 2023-04-01 LAB — MAGNESIUM: Magnesium: 1.5 mg/dL (ref 1.5–2.5)

## 2023-04-01 LAB — BASIC METABOLIC PANEL
BUN: 24 mg/dL — ABNORMAL HIGH (ref 6–23)
CO2: 30 mEq/L (ref 19–32)
Calcium: 9.1 mg/dL (ref 8.4–10.5)
Chloride: 102 mEq/L (ref 96–112)
Creatinine, Ser: 1.83 mg/dL — ABNORMAL HIGH (ref 0.40–1.50)
GFR: 33.61 mL/min — ABNORMAL LOW (ref >60.00)
Glucose, Bld: 232 mg/dL — ABNORMAL HIGH (ref 70–99)
Potassium: 3.6 mEq/L (ref 3.5–5.1)
Sodium: 140 mEq/L (ref 135–145)

## 2023-04-01 LAB — HEMOGLOBIN A1C: Hgb A1c MFr Bld: 9.1 % — ABNORMAL HIGH (ref 4.6–6.5)

## 2023-04-01 NOTE — Progress Notes (Signed)
Subjective:    Patient ID: Scott Vasquez, male    DOB: 27-Nov-1940, 82 y.o.   MRN: [id number]  HPI  He presents to the office today for TCM visit. He is with his daughter and wife.   Admit Date 03/14/2023 Discharge Date 03/16/2023  He presented to the hospital for left-sided facial weakness, neurolysed weakness, concern for stroke.  CT of the head without any acute findings.  MRI of the brain showed a 5 mm acute nonhemorrhagic infarct involving the left frontal corneal radiata.   Hospital Course  Acute ischemic stroke  -MRI with left frontal corona radiata stroke.  -CTA of the head and neck was negative for large vessel occlusion but has severe long segment stenosis of the right P2 segment.  2D echo showed LV EF of 55 to 60% with grade 1 diastolic dysfunction. -PT/OT recommended home health PT on discharge -Neurology recommended aspirin and Plavix for 21 days followed by aspirin alone on discharge -Patient will follow-up with neurology outpatient  Left-sided Bell's palsy -Recommended oral cycle of year and high-dose steroids on discharge for 7 days.  Protonix was added to the regimen for GI protection.  Mild dementia without behavioral disturbance, psychiatric disturbance, mood disturbance or anxiety -Renewed on Namenda, Aricept and Celexa  DM type II -Resumed glipizide, Trulicity, and diabetic diet  Hypertension -Continue with lisinopril and amlodipine upon discharge  Hyperlipidemia  -Continued on home dose of Lipitor 80 mg daily  Today they report that overall Scott Vasquez is doing fairly well.  He continues to have weakness but is using his rolling walker at home and cane when he leaves the home.  He has not had any falls.  He has not had any fevers or chills since being discharged from the hospital.  His appetite continues to be good.  They have been checking his blood pressure at home with readings in the 113's to 140s over 70s with most of his readings being in the 130s  over 70s.  He continues to have slurred speech, speech therapy has not been to the house yet but they are planning on coming out.  He is working with home health OT and PT.  Still has some facial droop and his left eye lid will not close completely.  Family has not been placing lubricating drops in the eyes.  His family reports that he is not speaking a lot and is sleeping throughout the day but this is not necessarily an acute issue.  Does have an appointment with neurology on October 29.   Review of Systems  Constitutional: Negative.   HENT: Negative.    Respiratory: Negative.    Cardiovascular: Negative.   Gastrointestinal: Negative.   Genitourinary: Negative.   Musculoskeletal:  Positive for arthralgias and gait problem.  Skin: Negative.   Neurological:  Positive for facial asymmetry, speech difficulty and weakness.  Hematological: Negative.   Psychiatric/Behavioral: Negative.     Past Medical History:  Diagnosis Date   Anemia    iron deficiency   Diabetes mellitus    type II   Diverticulosis of colon    Hypertension    Stroke (HCC)    numbness in left hand   Thyroid nodule     Social History   Socioeconomic History   Marital status: Married    Spouse name: Not on file   Number of children: 2   Years of education: 8   Highest education level: Not on file  Occupational History   Occupation: retired  Employer: [id number]  Tobacco Use   Smoking status: Former    Current packs/day: 0.00    Average packs/day: 0.5 packs/day for 20.0 years (10.0 ttl pk-yrs)    Types: Cigarettes    Start date: 25    Quit date: 08/10/1958    Years since quitting: 64.6   Smokeless tobacco: Never  Vaping Use   Vaping status: Never Used  Substance and Sexual Activity   Alcohol use: No   Drug use: No   Sexual activity: Not on file  Other Topics Concern   Not on file  Social History Narrative   Pt is R handed   Lives in single story home with his wife, Scott Vasquez   2 adult children    8th grade education   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[employer]   Married 56 years   Social Determinants of Health   Financial Resource Strain: Low Risk  (05/09/2021)   Overall Financial Resource Strain (CARDIA)    Difficulty of Paying Living Expenses: Not hard at all  Food Insecurity: No Food Insecurity (03/15/2023)   Hunger Vital Sign    Worried About Running Out of Food in the Last Year: Never true    Ran Out of Food in the Last Year: Never true  Transportation Needs: No Transportation Needs (03/15/2023)   PRAPARE - Administrator, Civil Service (Medical): No    Lack of Transportation (Non-Medical): No  Physical Activity: Not on file  Stress: Not on file  Social Connections: Not on file  Intimate Partner Violence: Patient Unable To Answer (03/15/2023)   Humiliation, Afraid, Rape, and Kick questionnaire    Fear of Current or Ex-Partner: Patient unable to answer    Emotionally Abused: Patient unable to answer    Physically Abused: Patient unable to answer    Sexually Abused: Patient unable to answer    Past Surgical History:  Procedure Laterality Date   BACK SURGERY     CATARACT EXTRACTION, BILATERAL     SHOULDER SURGERY  08/2007   Supple   THYROID LOBECTOMY Left 04/29/2015   THYROID LOBECTOMY Left 04/29/2015   Procedure: THYROID LOBECTOMY;  Surgeon: Darnell Level, MD;  Location: [facility] OR;  Service: General;  Laterality: Left;    Family History  Problem Relation Age of Onset   Diabetes Other    Diabetes Father        Died from diabetic coma in 1971-04-26   Hypertension Father    Stroke Mother        Died in 56   Hypertension Mother     Allergies  Allergen Reactions   Actos [Pioglitazone] Other (See Comments)    Diarrhea    Metformin And Related Other (See Comments)    Diarrhea      Current Outpatient Medications on File Prior to Visit  Medication Sig Dispense Refill   ACCU-CHEK AVIVA PLUS test strip TEST BLOOD SUGAR TWICE DAILY 200 strip 3   amLODipine (NORVASC) 10  MG tablet Take 1 tablet (10 mg total) by mouth daily. 30 tablet 2   atorvastatin (LIPITOR) 80 MG tablet TAKE 1 TABLET EVERY DAY 90 tablet 3   Blood Glucose Monitoring Suppl (ACCU-CHEK GUIDE) w/Device KIT USE AS DIRECTED 1 kit 0   citalopram (CELEXA) 20 MG tablet TAKE 1 TABLET EVERY DAY 90 tablet 3   clopidogrel (PLAVIX) 75 MG tablet Take 1 tablet (75 mg total) by mouth daily for 21 days. 21 tablet 0   donepezil (ARICEPT) 10 MG tablet TAKE 1 TABLET  AT BEDTIME 90 tablet 3   glipiZIDE (GLUCOTROL XL) 10 MG 24 hr tablet Take 1 tablet (10 mg total) by mouth daily with breakfast. 90 tablet 1   Insulin Pen Needle (PEN NEEDLES) 31G X 5 MM MISC Use with insulin pen 90 each 3   levothyroxine (SYNTHROID) 50 MCG tablet TAKE 1 TABLET EVERY DAY 90 tablet 3   memantine (NAMENDA) 10 MG tablet TAKE 1 TABLET TWICE DAILY (Patient taking differently: Take 10 mg by mouth 2 (two) times daily.) 180 tablet 3   pantoprazole (PROTONIX) 40 MG tablet Take 1 tablet (40 mg total) by mouth daily for 14 days. 14 tablet 0   aspirin 81 MG chewable tablet Chew 1 tablet (81 mg total) by mouth daily. (Patient not taking: Reported on 04/01/2023) 30 tablet 2   polyvinyl alcohol (LIQUIFILM TEARS) 1.4 % ophthalmic solution Place 1 drop into the left eye as needed for dry eyes. (Patient not taking: Reported on 04/01/2023) 15 mL 0   Vibegron (GEMTESA) 75 MG TABS Take 75 mg by mouth daily. (Patient not taking: Reported on 04/01/2023)     No current facility-administered medications on file prior to visit.    BP 122/78   Pulse 79   Temp 98.3 F (36.8 C) (Oral)   Ht 5\' 10"  (1.778 m)   Wt 178 lb 4 oz (80.9 kg)   SpO2 98%   BMI 25.58 kg/m        Objective:   Physical Exam Constitutional:      Appearance: Normal appearance.  Cardiovascular:     Rate and Rhythm: Normal rate and regular rhythm.     Pulses: Normal pulses.     Heart sounds: Normal heart sounds.  Pulmonary:     Effort: Pulmonary effort is normal.     Breath sounds:  Normal breath sounds.  Abdominal:     General: Abdomen is flat. Bowel sounds are normal.     Palpations: Abdomen is soft.  Musculoskeletal:        General: Normal range of motion.  Skin:    General: Skin is warm and dry.  Neurological:     General: No focal deficit present.     Mental Status: He is alert. Mental status is at baseline. He is confused.     Cranial Nerves: Facial asymmetry (left sided facial droop) present.     Motor: Weakness present.     Coordination: Coordination abnormal.     Gait: Gait abnormal.     Comments: 4/5 left sided lower extremity strength 3/5 right sided lower extremitystrength  5/5 bilateral upper extremity and grip strength  Left eye lid not able to fully close  Psychiatric:        Mood and Affect: Affect is flat.        Speech: Speech is delayed and slurred.        Behavior: Behavior is cooperative.        Cognition and Memory: Cognition is impaired. Memory is impaired. He exhibits impaired recent memory and impaired remote memory.        Judgment: Judgment normal.        Assessment & Plan:  1. Acute ischemic stroke [facility]) - Reviewed hospital notes, discharge instructions, labs,imaging and medication changes. All questions answered to the best of my ability.  - Continue with Plavix till finished then ASA alone.  - Has lower extremity weakness. Continue to work with PT/OT  - Encouraged to walk with rolling walker multiple times a day to get  strength built back up  - CBC with Differential/Platelet; Future - Basic Metabolic Panel; Future - Magnesium; Future - Magnesium - Basic Metabolic Panel - CBC with Differential/Platelet  2. Bell's palsy - Has not resolved. Use lubricating eye drops throughout the day and before bed. Manually close eye lid - CBC with Differential/Platelet; Future - Basic Metabolic Panel; Future - Magnesium; Future - Magnesium - Basic Metabolic Panel - CBC with Differential/Platelet  3. Mild vascular dementia without  behavioral disturbance, psychotic disturbance, mood disturbance, or anxiety (HCC) - At baseline  - CBC with Differential/Platelet; Future - Basic Metabolic Panel; Future - Magnesium; Future - Magnesium - Basic Metabolic Panel - CBC with Differential/Platelet  4. Diabetes mellitus treated with oral medication (HCC) - Will check A1c  - Family to monitor glucose - Will send in refill of Trulicity when A1c results  - CBC with Differential/Platelet; Future - Basic Metabolic Panel; Future - Hemoglobin A1c; Future - Magnesium; Future - Magnesium - Hemoglobin A1c - Basic Metabolic Panel - CBC with Differential/Platelet  5. Long-term current use of injectable noninsulin antidiabetic medication  - CBC with Differential/Platelet; Future - Basic Metabolic Panel; Future - Magnesium; Future - Magnesium - Basic Metabolic Panel - CBC with Differential/Platelet  6. Essential hypertension - At goal. No change in medication  - CBC with Differential/Platelet; Future - Basic Metabolic Panel; Future - Magnesium; Future - Magnesium - Basic Metabolic Panel - CBC with Differential/Platelet  7. Hyperlipidemia, unspecified hyperlipidemia type - Continue statin and ASA - CBC with Differential/Platelet; Future - Basic Metabolic Panel; Future - Magnesium; Future - Magnesium - Basic Metabolic Panel - CBC with Differential/Platelet  [name], NP   Time spent with patient today was 41 minutes which consisted of chart review, hospital admission, CVA, bells palsy, and DM,  work up, treatment answering questions and documentation.

## 2023-04-01 NOTE — Telephone Encounter (Signed)
Patient dropped off document FMLA, to be filled out by provider. Patient requested to send it back via Fax within 7-days. Document is located in providers tray at front office.Please advise at  fax [phone]

## 2023-04-01 NOTE — Progress Notes (Signed)
   Subjective:    Patient ID: Scott Vasquez, male    DOB: [birth year], 82 y.o.   MRN: [id number]  HPI    Review of Systems     Objective:   Physical Exam        Assessment & Plan:

## 2023-04-02 ENCOUNTER — Other Ambulatory Visit: Payer: Self-pay | Admitting: Adult Health

## 2023-04-02 DIAGNOSIS — I69392 Facial weakness following cerebral infarction: Secondary | ICD-10-CM | POA: Diagnosis not present

## 2023-04-02 DIAGNOSIS — I69398 Other sequelae of cerebral infarction: Secondary | ICD-10-CM | POA: Diagnosis not present

## 2023-04-02 DIAGNOSIS — R202 Paresthesia of skin: Secondary | ICD-10-CM | POA: Diagnosis not present

## 2023-04-02 DIAGNOSIS — E119 Type 2 diabetes mellitus without complications: Secondary | ICD-10-CM | POA: Diagnosis not present

## 2023-04-02 DIAGNOSIS — I69328 Other speech and language deficits following cerebral infarction: Secondary | ICD-10-CM | POA: Diagnosis not present

## 2023-04-02 DIAGNOSIS — G510 Bell's palsy: Secondary | ICD-10-CM | POA: Diagnosis not present

## 2023-04-02 DIAGNOSIS — F01B0 Vascular dementia, moderate, without behavioral disturbance, psychotic disturbance, mood disturbance, and anxiety: Secondary | ICD-10-CM | POA: Diagnosis not present

## 2023-04-02 DIAGNOSIS — I119 Hypertensive heart disease without heart failure: Secondary | ICD-10-CM | POA: Diagnosis not present

## 2023-04-02 DIAGNOSIS — G319 Degenerative disease of nervous system, unspecified: Secondary | ICD-10-CM | POA: Diagnosis not present

## 2023-04-02 MED ORDER — TRULICITY 4.5 MG/0.5ML ~~LOC~~ SOAJ: 4.5000 mg | SUBCUTANEOUS | 1 refills | Status: AC

## 2023-04-02 NOTE — Telephone Encounter (Signed)
Noted  

## 2023-04-05 DIAGNOSIS — I69328 Other speech and language deficits following cerebral infarction: Secondary | ICD-10-CM | POA: Diagnosis not present

## 2023-04-05 DIAGNOSIS — I69392 Facial weakness following cerebral infarction: Secondary | ICD-10-CM | POA: Diagnosis not present

## 2023-04-05 DIAGNOSIS — E119 Type 2 diabetes mellitus without complications: Secondary | ICD-10-CM | POA: Diagnosis not present

## 2023-04-05 DIAGNOSIS — I69398 Other sequelae of cerebral infarction: Secondary | ICD-10-CM | POA: Diagnosis not present

## 2023-04-05 DIAGNOSIS — G319 Degenerative disease of nervous system, unspecified: Secondary | ICD-10-CM | POA: Diagnosis not present

## 2023-04-05 DIAGNOSIS — G510 Bell's palsy: Secondary | ICD-10-CM | POA: Diagnosis not present

## 2023-04-05 DIAGNOSIS — F01B0 Vascular dementia, moderate, without behavioral disturbance, psychotic disturbance, mood disturbance, and anxiety: Secondary | ICD-10-CM | POA: Diagnosis not present

## 2023-04-05 DIAGNOSIS — I119 Hypertensive heart disease without heart failure: Secondary | ICD-10-CM | POA: Diagnosis not present

## 2023-04-05 DIAGNOSIS — R202 Paresthesia of skin: Secondary | ICD-10-CM | POA: Diagnosis not present

## 2023-04-07 DIAGNOSIS — I69328 Other speech and language deficits following cerebral infarction: Secondary | ICD-10-CM | POA: Diagnosis not present

## 2023-04-07 DIAGNOSIS — R202 Paresthesia of skin: Secondary | ICD-10-CM | POA: Diagnosis not present

## 2023-04-07 DIAGNOSIS — E119 Type 2 diabetes mellitus without complications: Secondary | ICD-10-CM | POA: Diagnosis not present

## 2023-04-07 DIAGNOSIS — I119 Hypertensive heart disease without heart failure: Secondary | ICD-10-CM | POA: Diagnosis not present

## 2023-04-07 DIAGNOSIS — G510 Bell's palsy: Secondary | ICD-10-CM | POA: Diagnosis not present

## 2023-04-07 DIAGNOSIS — F01B0 Vascular dementia, moderate, without behavioral disturbance, psychotic disturbance, mood disturbance, and anxiety: Secondary | ICD-10-CM | POA: Diagnosis not present

## 2023-04-07 DIAGNOSIS — G319 Degenerative disease of nervous system, unspecified: Secondary | ICD-10-CM | POA: Diagnosis not present

## 2023-04-07 DIAGNOSIS — I69392 Facial weakness following cerebral infarction: Secondary | ICD-10-CM | POA: Diagnosis not present

## 2023-04-07 DIAGNOSIS — I69398 Other sequelae of cerebral infarction: Secondary | ICD-10-CM | POA: Diagnosis not present

## 2023-04-07 NOTE — Telephone Encounter (Signed)
Noted! PPW faxed with confirmation

## 2023-04-08 NOTE — Telephone Encounter (Signed)
Forms have been picked up

## 2023-04-08 NOTE — Telephone Encounter (Signed)
Pt spouse notified pt daughter of update and daughter wants to pick up a  copy. Will place a copy upfront.

## 2023-04-15 DIAGNOSIS — G319 Degenerative disease of nervous system, unspecified: Secondary | ICD-10-CM | POA: Diagnosis not present

## 2023-04-15 DIAGNOSIS — I69398 Other sequelae of cerebral infarction: Secondary | ICD-10-CM | POA: Diagnosis not present

## 2023-04-15 DIAGNOSIS — G510 Bell's palsy: Secondary | ICD-10-CM | POA: Diagnosis not present

## 2023-04-15 DIAGNOSIS — F01B0 Vascular dementia, moderate, without behavioral disturbance, psychotic disturbance, mood disturbance, and anxiety: Secondary | ICD-10-CM | POA: Diagnosis not present

## 2023-04-15 DIAGNOSIS — I69392 Facial weakness following cerebral infarction: Secondary | ICD-10-CM | POA: Diagnosis not present

## 2023-04-15 DIAGNOSIS — I119 Hypertensive heart disease without heart failure: Secondary | ICD-10-CM | POA: Diagnosis not present

## 2023-04-15 DIAGNOSIS — I69328 Other speech and language deficits following cerebral infarction: Secondary | ICD-10-CM | POA: Diagnosis not present

## 2023-04-15 DIAGNOSIS — R202 Paresthesia of skin: Secondary | ICD-10-CM | POA: Diagnosis not present

## 2023-04-15 DIAGNOSIS — E119 Type 2 diabetes mellitus without complications: Secondary | ICD-10-CM | POA: Diagnosis not present

## 2023-04-20 DIAGNOSIS — F01B0 Vascular dementia, moderate, without behavioral disturbance, psychotic disturbance, mood disturbance, and anxiety: Secondary | ICD-10-CM | POA: Diagnosis not present

## 2023-04-20 DIAGNOSIS — E119 Type 2 diabetes mellitus without complications: Secondary | ICD-10-CM | POA: Diagnosis not present

## 2023-04-20 DIAGNOSIS — I119 Hypertensive heart disease without heart failure: Secondary | ICD-10-CM | POA: Diagnosis not present

## 2023-04-20 DIAGNOSIS — I69392 Facial weakness following cerebral infarction: Secondary | ICD-10-CM | POA: Diagnosis not present

## 2023-04-20 DIAGNOSIS — I69328 Other speech and language deficits following cerebral infarction: Secondary | ICD-10-CM | POA: Diagnosis not present

## 2023-04-20 DIAGNOSIS — G319 Degenerative disease of nervous system, unspecified: Secondary | ICD-10-CM | POA: Diagnosis not present

## 2023-04-20 DIAGNOSIS — R202 Paresthesia of skin: Secondary | ICD-10-CM | POA: Diagnosis not present

## 2023-04-20 DIAGNOSIS — G510 Bell's palsy: Secondary | ICD-10-CM | POA: Diagnosis not present

## 2023-04-20 DIAGNOSIS — I69398 Other sequelae of cerebral infarction: Secondary | ICD-10-CM | POA: Diagnosis not present

## 2023-04-22 DIAGNOSIS — B351 Tinea unguium: Secondary | ICD-10-CM | POA: Diagnosis not present

## 2023-04-22 DIAGNOSIS — M79676 Pain in unspecified toe(s): Secondary | ICD-10-CM | POA: Diagnosis not present

## 2023-04-22 DIAGNOSIS — E1142 Type 2 diabetes mellitus with diabetic polyneuropathy: Secondary | ICD-10-CM | POA: Diagnosis not present

## 2023-04-22 DIAGNOSIS — L84 Corns and callosities: Secondary | ICD-10-CM | POA: Diagnosis not present

## 2023-04-27 DIAGNOSIS — E119 Type 2 diabetes mellitus without complications: Secondary | ICD-10-CM | POA: Diagnosis not present

## 2023-04-27 DIAGNOSIS — R202 Paresthesia of skin: Secondary | ICD-10-CM | POA: Diagnosis not present

## 2023-04-27 DIAGNOSIS — G319 Degenerative disease of nervous system, unspecified: Secondary | ICD-10-CM | POA: Diagnosis not present

## 2023-04-27 DIAGNOSIS — I69398 Other sequelae of cerebral infarction: Secondary | ICD-10-CM | POA: Diagnosis not present

## 2023-04-27 DIAGNOSIS — G510 Bell's palsy: Secondary | ICD-10-CM | POA: Diagnosis not present

## 2023-04-27 DIAGNOSIS — I69328 Other speech and language deficits following cerebral infarction: Secondary | ICD-10-CM | POA: Diagnosis not present

## 2023-04-27 DIAGNOSIS — F01B0 Vascular dementia, moderate, without behavioral disturbance, psychotic disturbance, mood disturbance, and anxiety: Secondary | ICD-10-CM | POA: Diagnosis not present

## 2023-04-27 DIAGNOSIS — I119 Hypertensive heart disease without heart failure: Secondary | ICD-10-CM | POA: Diagnosis not present

## 2023-04-27 DIAGNOSIS — I69392 Facial weakness following cerebral infarction: Secondary | ICD-10-CM | POA: Diagnosis not present

## 2023-04-29 DIAGNOSIS — R202 Paresthesia of skin: Secondary | ICD-10-CM | POA: Diagnosis not present

## 2023-04-29 DIAGNOSIS — F01B0 Vascular dementia, moderate, without behavioral disturbance, psychotic disturbance, mood disturbance, and anxiety: Secondary | ICD-10-CM | POA: Diagnosis not present

## 2023-04-29 DIAGNOSIS — G319 Degenerative disease of nervous system, unspecified: Secondary | ICD-10-CM | POA: Diagnosis not present

## 2023-04-29 DIAGNOSIS — E119 Type 2 diabetes mellitus without complications: Secondary | ICD-10-CM | POA: Diagnosis not present

## 2023-04-29 DIAGNOSIS — I69392 Facial weakness following cerebral infarction: Secondary | ICD-10-CM | POA: Diagnosis not present

## 2023-04-29 DIAGNOSIS — I69328 Other speech and language deficits following cerebral infarction: Secondary | ICD-10-CM | POA: Diagnosis not present

## 2023-04-29 DIAGNOSIS — I119 Hypertensive heart disease without heart failure: Secondary | ICD-10-CM | POA: Diagnosis not present

## 2023-04-29 DIAGNOSIS — G510 Bell's palsy: Secondary | ICD-10-CM | POA: Diagnosis not present

## 2023-04-29 DIAGNOSIS — I69398 Other sequelae of cerebral infarction: Secondary | ICD-10-CM | POA: Diagnosis not present

## 2023-04-30 DIAGNOSIS — R202 Paresthesia of skin: Secondary | ICD-10-CM | POA: Diagnosis not present

## 2023-04-30 DIAGNOSIS — I69328 Other speech and language deficits following cerebral infarction: Secondary | ICD-10-CM | POA: Diagnosis not present

## 2023-04-30 DIAGNOSIS — I119 Hypertensive heart disease without heart failure: Secondary | ICD-10-CM | POA: Diagnosis not present

## 2023-04-30 DIAGNOSIS — I69392 Facial weakness following cerebral infarction: Secondary | ICD-10-CM | POA: Diagnosis not present

## 2023-04-30 DIAGNOSIS — E119 Type 2 diabetes mellitus without complications: Secondary | ICD-10-CM | POA: Diagnosis not present

## 2023-04-30 DIAGNOSIS — F01B0 Vascular dementia, moderate, without behavioral disturbance, psychotic disturbance, mood disturbance, and anxiety: Secondary | ICD-10-CM | POA: Diagnosis not present

## 2023-04-30 DIAGNOSIS — G319 Degenerative disease of nervous system, unspecified: Secondary | ICD-10-CM | POA: Diagnosis not present

## 2023-04-30 DIAGNOSIS — G510 Bell's palsy: Secondary | ICD-10-CM | POA: Diagnosis not present

## 2023-04-30 DIAGNOSIS — I69398 Other sequelae of cerebral infarction: Secondary | ICD-10-CM | POA: Diagnosis not present

## 2023-05-07 DIAGNOSIS — I69328 Other speech and language deficits following cerebral infarction: Secondary | ICD-10-CM | POA: Diagnosis not present

## 2023-05-07 DIAGNOSIS — G510 Bell's palsy: Secondary | ICD-10-CM | POA: Diagnosis not present

## 2023-05-07 DIAGNOSIS — E119 Type 2 diabetes mellitus without complications: Secondary | ICD-10-CM | POA: Diagnosis not present

## 2023-05-07 DIAGNOSIS — R202 Paresthesia of skin: Secondary | ICD-10-CM | POA: Diagnosis not present

## 2023-05-07 DIAGNOSIS — I69392 Facial weakness following cerebral infarction: Secondary | ICD-10-CM | POA: Diagnosis not present

## 2023-05-07 DIAGNOSIS — G319 Degenerative disease of nervous system, unspecified: Secondary | ICD-10-CM | POA: Diagnosis not present

## 2023-05-07 DIAGNOSIS — I69398 Other sequelae of cerebral infarction: Secondary | ICD-10-CM | POA: Diagnosis not present

## 2023-05-07 DIAGNOSIS — F01B0 Vascular dementia, moderate, without behavioral disturbance, psychotic disturbance, mood disturbance, and anxiety: Secondary | ICD-10-CM | POA: Diagnosis not present

## 2023-05-07 DIAGNOSIS — I119 Hypertensive heart disease without heart failure: Secondary | ICD-10-CM | POA: Diagnosis not present

## 2023-05-10 DIAGNOSIS — I119 Hypertensive heart disease without heart failure: Secondary | ICD-10-CM | POA: Diagnosis not present

## 2023-05-10 DIAGNOSIS — G319 Degenerative disease of nervous system, unspecified: Secondary | ICD-10-CM | POA: Diagnosis not present

## 2023-05-10 DIAGNOSIS — R202 Paresthesia of skin: Secondary | ICD-10-CM | POA: Diagnosis not present

## 2023-05-10 DIAGNOSIS — F01B0 Vascular dementia, moderate, without behavioral disturbance, psychotic disturbance, mood disturbance, and anxiety: Secondary | ICD-10-CM | POA: Diagnosis not present

## 2023-05-10 DIAGNOSIS — E119 Type 2 diabetes mellitus without complications: Secondary | ICD-10-CM | POA: Diagnosis not present

## 2023-05-10 DIAGNOSIS — I69328 Other speech and language deficits following cerebral infarction: Secondary | ICD-10-CM | POA: Diagnosis not present

## 2023-05-10 DIAGNOSIS — G510 Bell's palsy: Secondary | ICD-10-CM | POA: Diagnosis not present

## 2023-05-10 DIAGNOSIS — I69392 Facial weakness following cerebral infarction: Secondary | ICD-10-CM | POA: Diagnosis not present

## 2023-05-10 DIAGNOSIS — I69398 Other sequelae of cerebral infarction: Secondary | ICD-10-CM | POA: Diagnosis not present

## 2023-05-12 DIAGNOSIS — R202 Paresthesia of skin: Secondary | ICD-10-CM | POA: Diagnosis not present

## 2023-05-12 DIAGNOSIS — I69398 Other sequelae of cerebral infarction: Secondary | ICD-10-CM | POA: Diagnosis not present

## 2023-05-12 DIAGNOSIS — G319 Degenerative disease of nervous system, unspecified: Secondary | ICD-10-CM | POA: Diagnosis not present

## 2023-05-12 DIAGNOSIS — E119 Type 2 diabetes mellitus without complications: Secondary | ICD-10-CM | POA: Diagnosis not present

## 2023-05-12 DIAGNOSIS — G510 Bell's palsy: Secondary | ICD-10-CM | POA: Diagnosis not present

## 2023-05-12 DIAGNOSIS — I69328 Other speech and language deficits following cerebral infarction: Secondary | ICD-10-CM | POA: Diagnosis not present

## 2023-05-12 DIAGNOSIS — I69392 Facial weakness following cerebral infarction: Secondary | ICD-10-CM | POA: Diagnosis not present

## 2023-05-12 DIAGNOSIS — I119 Hypertensive heart disease without heart failure: Secondary | ICD-10-CM | POA: Diagnosis not present

## 2023-05-12 DIAGNOSIS — F01B0 Vascular dementia, moderate, without behavioral disturbance, psychotic disturbance, mood disturbance, and anxiety: Secondary | ICD-10-CM | POA: Diagnosis not present

## 2023-05-14 ENCOUNTER — Other Ambulatory Visit: Payer: Self-pay

## 2023-05-14 MED ORDER — AMLODIPINE BESYLATE 10 MG PO TABS: 10.0000 mg | ORAL_TABLET | Freq: Every day | ORAL | 1 refills | Status: DC

## 2023-06-07 NOTE — Progress Notes (Signed)
Assessment/Plan:   Dementia likely due to Alzheimer's Disease and Vascular etiology  Scott Vasquez is a very pleasant 82 y.o. RH male with extensive cardiovascular history including acute thalamic infarct, subcortical stroke with aphasia with residual left hand tingling and most recently a  incidental L frontal corona radiata non hemorrhagic CVA and L Bell's Palsy requiring hospitalization, hyperlipidemia, hypertension, DM2, and a history of B12 deficiency, iron deficiency anemia, hypothyroidism, CKD 3 as well as dementia likely due to vascular disease seen today in follow up for memory loss. Patient is currently on memantine 10 mg twice daily and donepezil 10 mg nightly. Memory is stable, but does need help with ADLs     Follow up in  6 months. Continue donepezil 10 mg daily and memantine 10 mg twice daily, side effects discussed Recommend good control of her cardiovascular risk factors. He is off Plavix, continue daily ASA, follow with Cards Continue to control mood as per PCP, he is on citalopram 20 mg daily Continue aspirin and Plavix daily for secondary stroke prevention    Subjective:    This patient is accompanied in the office by his wife who supplements the history.  Previous records as well as any outside records available were reviewed prior to todays visit. Patient was last seen on 11/17/2022 with MMSE 18/30    Any changes in memory since last visit? "  He has some good and bad days ".  He has difficulty remembering very recent information. He likes to watch Westerns, especially "Gunsmoke ". repeats oneself? Denies Disoriented when walking into a room?  Patient denies    Leaving objects in unusual places?  May misplace things but not in unusual places   Wandering behavior?  denies   Any personality changes since last visit?  Denies. "Same sweet self"   Any worsening depression?:  Denies.   Hallucinations or paranoia?  Denies.   Seizures? denies    Any sleep changes?  Sleeps well.  Denies vivid dreams, REM behavior or sleepwalking   Sleep apnea?   Denies.   Any hygiene concerns?  "He only takes showers when he has to go out "-wife says Independent of bathing and dressing?  He needs assistance due to unilateral weakness after stroke, especially with showering. Does the patient needs help with medications?  Wife is in charge    Who is in charge of the finances?  Wife and daughter are in charge      Any changes in appetite?  Denies. He loves ice cream.     Patient have trouble swallowing? Denies.   Does the patient cook? No Any headaches?   denies   Chronic back pain  denies   Ambulates with difficulty?  Endorsed, uses a L cane for stability, especially after the stroke Recent falls or head injuries? Denies.     Unilateral weakness, numbness or tingling?  Endorsed, he has residual left hand numbness and tingling and sees Dr. Pearlean Brownie for aphasia after his stroke.  In addition, he sustained acute nonhemorrhagic infarct involving the left frontal corona radiata in August 2024 after presenting with left facial weakness and generalized weakness. However, the finding was incidental and the symptoms were consistent with Bell's palsy on the L ( he was Rx'd with antivirals and prednisone) Any tremors?  Denies   Any anosmia?  Denies   Any incontinence of urine?  Endorsed, wears diapers Any bowel dysfunction?   Denies      Patient lives with wife and daughter Does  the patient drive? No longer drives    History on Initial Assessment 10/12/2018: This is a 82 year old right-handed man with a history of hypertension, hyperlipidemia, diabetes, subcortical stroke in 2001 with residual left hand tingling, presenting for evaluation of memory loss. He states his memory is bad, he started noticing changes a couple of years ago. He denies getting lost driving. He misses bills because "there is no money to pay them." He manages his own medications and denies missing doses, his wife  agrees. He was reporting difficulty remembering what he read in the bible or magazine after he finishes reading. He also has difficulty remembering names. His wife states that yesterday the bank called her because he was there and could not remember his social security number. She reports he got lost last month when he made a wrong turn. She reports other bills are paid but sometimes late, he has not paid taxes and car insurance from last year, he told her he will pay when ready. His wife asked to speak privately about her concerns, she is asking for a pill to "calm him down." She reports he is a "time bomb in the morning, I am his worst enemy." He would sit watching TV all day. Symptoms started last summer, he started sleeping upstairs because he was getting irritated with people. Their daughter lives with them. She denies any paranoia or hallucinations. Sleep is good. He is independent with dressing and bathing.   He denies any headaches, dizziness, diplopia, dysarthria/dysphagia, neck/back pain, focal weakness, bowel/bladder dysfunction, anosmia, or tremors. He has chronic left hand tingling since his stroke. He has had nasal discharge since throat surgery in 2017. He has been on Donepezil since 2017, no side effects. No family history of dementia. No history of significant head injuries or alcohol use.   I personally reviewed MRI brain in 2016 showing an acute punctate nonhemorrhagic infarct within the left paramedian superior midbrain and inferomedial thalamus, multiple remote bilateral lacunar infarcts, advanced chronic microvascular disease, multiple foci of remote hemorrhage    Recent studies personally reviewed  Acute ischemic stroke MRI of the brain March 15, 2023: 5 mm acute ischemic nonhemorrhagic infarct involving the left frontal corona radiata. 2. Subtle diffusion signal abnormality about the contralateral anterior right basal ganglia, likely an evolving late subacute ischemic infarct. 3.  Underlying age-related cerebral atrophy with advanced chronic microvascular ischemic disease, with multiple remote lacunar infarcts involving the hemispheric cerebral white matter, deep gray nuclei, and pons. 4. Multiple scattered chronic micro hemorrhages clustered about the brainstem and deep gray nuclei, most characteristic of chronic poorly controlled hypertension.     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    2D echo shows LV ejection fraction of 55 to 60% with grade 1 diastolic dysfunction.   LDL 55, A1c poorly controlled 9.4   PREVIOUS MEDICATIONS:   CURRENT MEDICATIONS:  Outpatient Encounter Medications as of 06/08/2023  Medication Sig   ACCU-CHEK AVIVA PLUS test strip TEST BLOOD SUGAR TWICE DAILY   amLODipine (NORVASC) 10 MG tablet Take 1 tablet (10 mg total) by mouth daily.   aspirin 81 MG chewable tablet Chew 1 tablet (81 mg total) by mouth daily.   Blood Glucose Monitoring Suppl (ACCU-CHEK GUIDE) w/Device KIT USE AS DIRECTED   citalopram (CELEXA) 20 MG tablet TAKE 1 TABLET EVERY DAY   donepezil (ARICEPT) 10 MG tablet TAKE 1 TABLET AT BEDTIME  Dulaglutide (TRULICITY) 4.5 MG/0.5ML SOPN Inject 4.5 mg as directed once a week.   glipiZIDE (GLUCOTROL XL) 10 MG 24 hr tablet Take 1 tablet (10 mg total) by mouth daily with breakfast.   Insulin Pen Needle (PEN NEEDLES) 31G X 5 MM MISC Use with insulin pen   levothyroxine (SYNTHROID) 50 MCG tablet TAKE 1 TABLET EVERY DAY   lisinopril (ZESTRIL) 40 MG tablet Take 40 mg by mouth daily.   memantine (NAMENDA) 10 MG tablet TAKE 1 TABLET TWICE DAILY (Patient taking differently: Take 10 mg by mouth 2 (two) times daily.)   pantoprazole (PROTONIX) 40 MG tablet Take 1 tablet (40 mg total) by mouth daily for 14 days.   polyvinyl alcohol (LIQUIFILM TEARS) 1.4 % ophthalmic solution Place 1 drop into the left eye as needed for dry eyes.   atorvastatin (LIPITOR) 80  MG tablet TAKE 1 TABLET EVERY DAY (Patient not taking: Reported on 06/08/2023)   Vibegron (GEMTESA) 75 MG TABS Take 75 mg by mouth daily. (Patient not taking: Reported on 04/01/2023)   No facility-administered encounter medications on file as of 06/08/2023.       12/07/2022    6:00 PM 03/18/2022   11:35 AM 05/20/2021    1:00 PM  MMSE - Mini Mental State Exam  Orientation to time 2 1 4   Orientation to Place 3 5 5   Registration 3 3 3   Attention/ Calculation 1 3 0  Recall 1 1 2   Language- name 2 objects 2 2 1   Language- repeat 1 1 0  Language- follow 3 step command 3 3 3   Language- read & follow direction 1 1 1   Write a sentence 1 1 1   Copy design 0 0 0  Total score 18 21 20       10/12/2018    2:00 PM  Montreal Cognitive Assessment   Visuospatial/ Executive (0/5) 1  Naming (0/3) 1  Attention: Read list of digits (0/2) 1  Attention: Read list of letters (0/1) 0  Attention: Serial 7 subtraction starting at 100 (0/3) 0  Language: Repeat phrase (0/2) 0  Language : Fluency (0/1) 0  Abstraction (0/2) 0  Delayed Recall (0/5) 1  Orientation (0/6) 6  Total 10  Adjusted Score (based on education) 11    Objective:     PHYSICAL EXAMINATION:    VITALS:   Vitals:   06/08/23 1452  BP: 106/64  Pulse: 77  Resp: 20  SpO2: 100%  Weight: 183 lb (83 kg)  Height: 5\' 10"  (1.778 m)    GEN:  The patient appears stated age and is in NAD. HEENT:  Normocephalic, atraumatic.   Neurological examination:  General: NAD, well-groomed, appears stated age. Orientation: The patient is alert. Oriented to person, not to place and date Cranial nerves: There is good facial symmetry.The speech is nonfluent, can only speaks short sentences in a few words, with hesitancy, he has moderate expressive aphasia with mild dysarthria.  Comprehension is impaired.  Fund of knowledge is reduced.  Recent and remote memory impaired.  Attention reduced.  Able to name objects and repeat phrases with some difficulty.   Hearing is intact to conversational tone. Sensation: Sensation is intact to light touch throughout Motor: Strength is at least antigravity x4 R, residual mild L sided weakness  DTR's 1/4 in UE/LE     Movement examination: Tone: There is normal tone in the UE/LE Abnormal movements:  no tremor.  No myoclonus.  No asterixis.   Coordination:  There is no decremation with  RAM's. Normal finger to nose  Gait and Station: The patient has  difficulty arising out of a deep-seated chair without the use of the hands. The patient's stride length is good with a cane.  Gait is cautious and narrow.    Thank you for allowing [place] the opportunity to participate in the care of this nice patient. Please do not hesitate to contact us for any questions or concerns.   Total time spent on today's visit was 22 minutes dedicated to this patient today, preparing to see patient, examining the patient, ordering tests and/or medications and counseling the patient, documenting clinical information in the EHR or other health record, independently interpreting results and communicating results to the patient/family, discussing treatment and goals, answering patient's questions and coordinating care.  Cc:  [name], NP  Marlowe Kays 06/08/2023 3:09 PM

## 2023-06-08 ENCOUNTER — Encounter: Payer: Self-pay | Admitting: Physician Assistant

## 2023-06-08 ENCOUNTER — Ambulatory Visit: Payer: Medicare HMO | Admitting: Physician Assistant

## 2023-06-08 VITALS — BP 106/64 | HR 77 | Resp 20 | Ht 70.0 in | Wt 183.0 lb

## 2023-06-08 DIAGNOSIS — F03B18 Unspecified dementia, moderate, with other behavioral disturbance: Secondary | ICD-10-CM | POA: Diagnosis not present

## 2023-06-08 DIAGNOSIS — R4701 Aphasia: Secondary | ICD-10-CM | POA: Diagnosis not present

## 2023-06-08 NOTE — Patient Instructions (Signed)
It was a pleasure to see you today at our office.   Recommendations:  Continue donepezil 10 mg daily. Side effects were discussed  Continue Memantine 10 mg twice daily.Side effects were discussed  Continue the mood medication citalopram 20 mg daily Increase your level of activity  Follow up in 6 months     RECOMMENDATIONS FOR ALL PATIENTS WITH MEMORY PROBLEMS: 1. Continue to exercise (Recommend 30 minutes of walking everyday, or 3 hours every week) 2. Increase social interactions - continue going to [place] and enjoy social gatherings with friends and family 3. Eat healthy, avoid fried foods and eat more fruits and vegetables 4. Maintain adequate blood pressure, blood sugar, and blood cholesterol level. Reducing the risk of stroke and cardiovascular disease also helps promoting better memory. 5. Avoid stressful situations. Live a simple life and avoid aggravations. Organize your time and prepare for the next day in anticipation. 6. Sleep well, avoid any interruptions of sleep and avoid any distractions in the bedroom that may interfere with adequate sleep quality 7. Avoid sugar, avoid sweets as there is a strong link between excessive sugar intake, diabetes, and cognitive impairment We discussed the Mediterranean diet, which has been shown to help patients reduce the risk of progressive memory disorders and reduces cardiovascular risk. This includes eating fish, eat fruits and green leafy vegetables, nuts like almonds and hazelnuts, walnuts, and also use olive oil. Avoid fast foods and fried foods as much as possible. Avoid sweets and sugar as sugar use has been linked to worsening of memory function.  There is always a concern of gradual progression of memory problems. If this is the case, then we may need to adjust level of care according to patient needs. Support, both to the patient and caregiver, should then be put into place.    FALL PRECAUTIONS: Be cautious when walking. Scan the  area for obstacles that may increase the risk of trips and falls. When getting up in the mornings, sit up at the edge of the bed for a few minutes before getting out of bed. Consider elevating the bed at the head end to avoid drop of blood pressure when getting up. Walk always in a well-lit room (use night lights in the walls). Avoid area rugs or power cords from appliances in the middle of the walkways. Use a walker or a cane if necessary and consider physical therapy for balance exercise. Get your eyesight checked regularly.   HOME SAFETY: Consider the safety of the kitchen when operating appliances like stoves, microwave oven, and blender. Consider having supervision and share cooking responsibilities until no longer able to participate in those. Accidents with firearms and other hazards in the house should be identified and addressed as well.   ABILITY TO BE LEFT ALONE: If patient is unable to contact 911 operator, consider using LifeLine, or when the need is there, arrange for someone to stay with patients. Smoking is a fire hazard, consider supervision or cessation. Risk of wandering should be assessed by caregiver and if detected at any point, supervision and safe proof recommendations should be instituted       Mediterranean Diet A Mediterranean diet refers to food and lifestyle choices that are based on the traditions of countries located on the [employer]. This way of eating has been shown to help prevent certain conditions and improve outcomes for people who have chronic diseases, like kidney disease and heart disease. What are tips for following this plan? Lifestyle  Cook and eat  meals together with your family, when possible. Drink enough fluid to keep your urine clear or pale yellow. Be physically active every day. This includes: Aerobic exercise like running or swimming. Leisure activities like gardening, walking, or housework. Get 7-8 hours of sleep each night. If  recommended by your health care provider, drink red wine in moderation. This means 1 glass a day for nonpregnant women and 2 glasses a day for men. A glass of wine equals 5 oz (150 mL). Reading food labels  Check the serving size of packaged foods. For foods such as rice and pasta, the serving size refers to the amount of cooked product, not dry. Check the total fat in packaged foods. Avoid foods that have saturated fat or trans fats. Check the ingredients list for added sugars, such as corn syrup. Shopping  At the grocery store, buy most of your food from the areas near the walls of the store. This includes: Fresh fruits and vegetables (produce). Grains, beans, nuts, and seeds. Some of these may be available in unpackaged forms or large amounts (in bulk). Fresh seafood. Poultry and eggs. Low-fat dairy products. Buy whole ingredients instead of prepackaged foods. Buy fresh fruits and vegetables in-season from local farmers markets. Buy frozen fruits and vegetables in resealable bags. If you do not have access to quality fresh seafood, buy precooked frozen shrimp or canned fish, such as tuna, salmon, or sardines. Buy small amounts of raw or cooked vegetables, salads, or olives from the deli or salad bar at your store. Stock your pantry so you always have certain foods on hand, such as olive oil, canned tuna, canned tomatoes, rice, pasta, and beans. Cooking  Cook foods with extra-virgin olive oil instead of using butter or other vegetable oils. Have meat as a side dish, and have vegetables or grains as your main dish. This means having meat in small portions or adding small amounts of meat to foods like pasta or stew. Use beans or vegetables instead of meat in common dishes like chili or lasagna. Experiment with different cooking methods. Try roasting or broiling vegetables instead of steaming or sauteing them. Add frozen vegetables to soups, stews, pasta, or rice. Add nuts or seeds for added  healthy fat at each meal. You can add these to yogurt, salads, or vegetable dishes. Marinate fish or vegetables using olive oil, lemon juice, garlic, and fresh herbs. Meal planning  Plan to eat 1 vegetarian meal one day each week. Try to work up to 2 vegetarian meals, if possible. Eat seafood 2 or more times a week. Have healthy snacks readily available, such as: Vegetable sticks with hummus. Greek yogurt. Fruit and nut trail mix. Eat balanced meals throughout the week. This includes: Fruit: 2-3 servings a day Vegetables: 4-5 servings a day Low-fat dairy: 2 servings a day Fish, poultry, or lean meat: 1 serving a day Beans and legumes: 2 or more servings a week Nuts and seeds: 1-2 servings a day Whole grains: 6-8 servings a day Extra-virgin olive oil: 3-4 servings a day Limit red meat and sweets to only a few servings a month What are my food choices? Mediterranean diet Recommended Grains: Whole-grain pasta. Brown rice. Bulgar wheat. Polenta. Couscous. Whole-wheat bread. Orpah Cobb. Vegetables: Artichokes. Beets. Broccoli. Cabbage. Carrots. Eggplant. Green beans. Chard. Kale. Spinach. Onions. Leeks. Peas. Squash. Tomatoes. Peppers. Radishes. Fruits: Apples. Apricots. Avocado. Berries. Bananas. Cherries. Dates. Figs. Grapes. Lemons. Melon. Oranges. Peaches. Plums. Pomegranate. Meats and other protein foods: Beans. Almonds. Sunflower seeds. Pine nuts. Peanuts.  [place]. [place]. Scallops. Shrimp. Tuna. Tilapia. Clams. Oysters. Eggs. Dairy: Low-fat milk. Cheese. Greek yogurt. Beverages: Water. Red wine. Herbal tea. Fats and oils: Extra virgin olive oil. Avocado oil. Grape seed oil. Sweets and desserts: [place] yogurt with honey. Baked apples. Poached pears. Trail mix. Seasoning and other foods: Basil. Cilantro. Coriander. Cumin. Mint. Parsley. Sage. Rosemary. Tarragon. Garlic. Oregano. Thyme. Pepper. Balsalmic vinegar. Tahini. Hummus. Tomato sauce. Olives. Mushrooms. Limit these Grains:  Prepackaged pasta or rice dishes. Prepackaged cereal with added sugar. Vegetables: Deep fried potatoes (french fries). Fruits: Fruit canned in syrup. Meats and other protein foods: Beef. Pork. Lamb. Poultry with skin. Hot dogs. Tomasa Blase. Dairy: Ice cream. Sour cream. Whole milk. Beverages: Juice. Sugar-sweetened soft drinks. Beer. Liquor and spirits. Fats and oils: Butter. Canola oil. Vegetable oil. Beef fat (tallow). Lard. Sweets and desserts: Cookies. Cakes. Pies. Candy. Seasoning and other foods: Mayonnaise. Premade sauces and marinades. The items listed may not be a complete list. Talk with your dietitian about what dietary choices are right for you. Summary The Mediterranean diet includes both food and lifestyle choices. Eat a variety of fresh fruits and vegetables, beans, nuts, seeds, and whole grains. Limit the amount of red meat and sweets that you eat. Talk with your health care provider about whether it is safe for you to drink red wine in moderation. This means 1 glass a day for nonpregnant women and 2 glasses a day for men. A glass of wine equals 5 oz (150 mL). This information is not intended to replace advice given to you by your health care provider. Make sure you discuss any questions you have with your health care provider. Document Released: 03/19/2016 Document Revised: 04/21/2016 Document Reviewed: 03/19/2016 Elsevier Interactive Patient Education  2017 [employer].

## 2023-06-10 ENCOUNTER — Other Ambulatory Visit: Payer: Medicare HMO

## 2023-06-10 ENCOUNTER — Ambulatory Visit: Payer: Medicare HMO | Admitting: Physician Assistant

## 2023-06-15 ENCOUNTER — Telehealth: Payer: Self-pay

## 2023-06-15 NOTE — Progress Notes (Signed)
   06/15/2023  Patient ID: Scott Vasquez, male   DOB: 1940/12/16, 82 y.o.   MRN: [id number]  Contacted wife to attempt to r/s cancelled appt from last week. Wife had a fall earlier that day and needed to cancel.  Wife requests a call next week to r/s.  Sherrill Raring, PharmD Clinical Pharmacist [phone]

## 2023-06-22 ENCOUNTER — Telehealth: Payer: Self-pay | Admitting: Adult Health

## 2023-06-22 NOTE — Telephone Encounter (Signed)
Noted  

## 2023-06-22 NOTE — Telephone Encounter (Signed)
Okay to fill out paperwork

## 2023-06-22 NOTE — Telephone Encounter (Signed)
Requests form for handicap placard be filled out

## 2023-06-22 NOTE — Telephone Encounter (Signed)
Okay for refill?  

## 2023-06-23 NOTE — Telephone Encounter (Signed)
Pt spouse notified of udpate

## 2023-07-05 ENCOUNTER — Telehealth: Payer: Self-pay | Admitting: Adult Health

## 2023-07-05 NOTE — Telephone Encounter (Signed)
Pt wife call and stated pt is in a doughnut and want to know if he can get some Trulicity until January pt wife want a call back.

## 2023-07-06 NOTE — Telephone Encounter (Signed)
Pt spouse notified of update and verbalized understanding. Pt spouse stated that as long as pt does not have to measure it then any would be fine.

## 2023-07-06 NOTE — Telephone Encounter (Signed)
Please advise 

## 2023-07-06 NOTE — Telephone Encounter (Signed)
Per spouse she cannot do it neither can pt. She stated that they both struggled with it the last time and caused " a mess".

## 2023-07-14 ENCOUNTER — Other Ambulatory Visit: Payer: Self-pay | Admitting: Adult Health

## 2023-07-14 NOTE — Telephone Encounter (Signed)
Pt is on the schedule already for tomorrow. Per spouse he will come in with their son and she would like for someone to show him the pen.

## 2023-07-15 ENCOUNTER — Ambulatory Visit: Payer: Medicare HMO | Admitting: Adult Health

## 2023-07-15 ENCOUNTER — Encounter: Payer: Self-pay | Admitting: Adult Health

## 2023-07-15 VITALS — BP 120/60 | HR 75 | Temp 98.3°F | Ht 69.0 in | Wt 183.2 lb

## 2023-07-15 DIAGNOSIS — N3281 Overactive bladder: Secondary | ICD-10-CM | POA: Diagnosis not present

## 2023-07-15 DIAGNOSIS — I10 Essential (primary) hypertension: Secondary | ICD-10-CM | POA: Diagnosis not present

## 2023-07-15 DIAGNOSIS — Z7984 Long term (current) use of oral hypoglycemic drugs: Secondary | ICD-10-CM

## 2023-07-15 DIAGNOSIS — Z0001 Encounter for general adult medical examination with abnormal findings: Secondary | ICD-10-CM

## 2023-07-15 DIAGNOSIS — Z23 Encounter for immunization: Secondary | ICD-10-CM

## 2023-07-15 DIAGNOSIS — E1165 Type 2 diabetes mellitus with hyperglycemia: Secondary | ICD-10-CM

## 2023-07-15 DIAGNOSIS — F03B18 Unspecified dementia, moderate, with other behavioral disturbance: Secondary | ICD-10-CM | POA: Diagnosis not present

## 2023-07-15 DIAGNOSIS — E039 Hypothyroidism, unspecified: Secondary | ICD-10-CM

## 2023-07-15 DIAGNOSIS — Z0000 Encounter for general adult medical examination without abnormal findings: Secondary | ICD-10-CM

## 2023-07-15 DIAGNOSIS — Z794 Long term (current) use of insulin: Secondary | ICD-10-CM

## 2023-07-15 DIAGNOSIS — I639 Cerebral infarction, unspecified: Secondary | ICD-10-CM

## 2023-07-15 LAB — MICROALBUMIN / CREATININE URINE RATIO
Creatinine,U: 269.4 mg/dL
Microalb Creat Ratio: 1 mg/g (ref 0.0–30.0)
Microalb, Ur: 2.8 mg/dL — ABNORMAL HIGH (ref 0.0–1.9)

## 2023-07-15 MED ORDER — TRULICITY 1.5 MG/0.5ML ~~LOC~~ SOAJ: 1.5000 mg | SUBCUTANEOUS | 0 refills | Status: DC

## 2023-07-15 MED ORDER — PEN NEEDLES 31G X 5 MM MISC
3 refills | Status: DC
Start: 2023-07-15 — End: 2023-11-07

## 2023-07-15 NOTE — Patient Instructions (Signed)
It was great seeing you today   We will follow up with you regarding your lab work   Please let me know if you need anything   

## 2023-07-15 NOTE — Progress Notes (Signed)
Subjective:    Patient ID: Scott Vasquez, male    DOB: [birth year], 82 y.o.   MRN: [id number]  HPI Patient presents for yearly preventative medicine examination. He is a pleasant 82 year old male who  has a past medical history of Anemia, Diabetes mellitus, Diverticulosis of colon, Hypertension, Stroke (HCC), and Thyroid nodule.  He is with his son and wife today   Diabetes Mellitus Type 2 -he was last seen he was prescribed Trulicity 1.5 mg weekly, glipizide 10 mg extended release daily, He has also been on Lantus in the past. He has hit the donut hole and his Trulicity is too expensive. He has not been getting his insulin as his wife was not sure how to properly use the pen.   Lab Results  Component Value Date   HGBA1C 9.1 (H) 04/01/2023   HGBA1C 9.4 (A) 12/31/2022   HGBA1C 10.8 (A) 06/17/2022   HTN -managed with Norvasc 10 mg.  He denies dizziness, lightheadedness, chest pain, or shortness of breath BP Readings from Last 3 Encounters:  07/15/23 120/60  06/08/23 106/64  04/01/23 122/78   OAB-takes Gemtesa 75 mg daily.  He denies symptoms  History of CVA-most recently was admitted in August 2024 for acute ischemic stroke.  This time MRI with left frontal corona radiata.  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  His 2D echo showed an LVEF of 55 to 60% with grade 1 diastolic dysfunction.  He was on Plavix and aspirin together for 21 days followed by aspirin alone.  Moderate dementia-is followed by neurology every 6 months.  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  Managed with Aricept 10 mg daily and Namenda 10 mg daily.  He does feel as though his memory is bad.   He is on Celexa 20 mg daily for agitation.  Denies feeling depressed. Continues to drive, does not get lost.   Hypothyroidism-takes Synthroid 50 mcg daily Lab Results  Component Value Date   TSH 4.07 01/16/2022    All  immunizations and health maintenance protocols were reviewed with the patient and needed orders were placed.  Appropriate screening laboratory values were ordered for the patient including screening of hyperlipidemia, renal function and hepatic function. If indicated by BPH, a PSA was ordered.  Medication reconciliation,  past medical history, social history, problem list and allergies were reviewed in detail with the patient  Goals were established with regard to weight loss, exercise, and  diet in compliance with medications  Wt Readings from Last 3 Encounters:  07/15/23 183 lb 3.2 oz (83.1 kg)  06/08/23 183 lb (83 kg)  04/01/23 178 lb 4 oz (80.9 kg)   End of life planning was discussed.  Review of Systems  Constitutional: Negative.   HENT: Negative.    Eyes: Negative.   Respiratory: Negative.    Cardiovascular: Negative.   Gastrointestinal: Negative.   Endocrine: Negative.   Genitourinary: Negative.   Musculoskeletal:  Positive for arthralgias.  Skin: Negative.   Allergic/Immunologic: Negative.   Neurological:  Positive for speech difficulty and weakness.  Hematological: Negative.   Psychiatric/Behavioral:  Positive for confusion and decreased concentration.   All other systems reviewed and are negative.  Past Medical History:  Diagnosis Date   Anemia    iron deficiency   Diabetes mellitus    type II   Diverticulosis of colon    Hypertension    Stroke (  HCC)    numbness in left hand   Thyroid nodule     Social History   Socioeconomic History   Marital status: Married    Spouse name: Not on file   Number of children: 2   Years of education: 8   Highest education level: Not on file  Occupational History   Occupation: retired    Associate Professor: [employer]  Tobacco Use   Smoking status: Former    Current packs/day: 0.00    Average packs/day: 0.5 packs/day for 20.0 years (10.0 ttl pk-yrs)    Types: Cigarettes    Start date: 15    Quit date: 08/10/1958    Years  since quitting: 64.9   Smokeless tobacco: Never  Vaping Use   Vaping status: Never Used  Substance and Sexual Activity   Alcohol use: No   Drug use: No   Sexual activity: Not on file  Other Topics Concern   Not on file  Social History Narrative   Pt is R handed   Lives in single story home with his wife, Scott Vasquez   2 adult children   8th grade education   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[employer]   Married 56 years   Social Determinants of Health   Financial Resource Strain: Low Risk  (05/09/2021)   Overall Financial Resource Strain (CARDIA)    Difficulty of Paying Living Expenses: Not hard at all  Food Insecurity: No Food Insecurity (03/15/2023)   Hunger Vital Sign    Worried About Running Out of Food in the Last Year: Never true    Ran Out of Food in the Last Year: Never true  Transportation Needs: No Transportation Needs (03/15/2023)   PRAPARE - Administrator, Civil Service (Medical): No    Lack of Transportation (Non-Medical): No  Physical Activity: Not on file  Stress: Not on file  Social Connections: Not on file  Intimate Partner Violence: Patient Unable To Answer (03/15/2023)   Humiliation, Afraid, Rape, and Kick questionnaire    Fear of Current or Ex-Partner: Patient unable to answer    Emotionally Abused: Patient unable to answer    Physically Abused: Patient unable to answer    Sexually Abused: Patient unable to answer    Past Surgical History:  Procedure Laterality Date   BACK SURGERY     CATARACT EXTRACTION, BILATERAL     SHOULDER SURGERY  08/2007   Supple   THYROID LOBECTOMY Left 04/29/2015   THYROID LOBECTOMY Left 04/29/2015   Procedure: THYROID LOBECTOMY;  Surgeon: Darnell Level, MD;  Location: [facility];  Service: General;  Laterality: Left;    Family History  Problem Relation Age of Onset   Diabetes Other    Diabetes Father        Died from diabetic coma in 07/25/1971   Hypertension Father    Stroke Mother        Died in 47   Hypertension Mother      Allergies  Allergen Reactions   Actos [Pioglitazone] Other (See Comments)    Diarrhea    Metformin And Related Other (See Comments)    Diarrhea      Current Outpatient Medications on File Prior to Visit  Medication Sig Dispense Refill   ACCU-CHEK AVIVA PLUS test strip TEST BLOOD SUGAR TWICE DAILY 200 strip 3   amLODipine (NORVASC) 10 MG tablet Take 1 tablet (10 mg total) by mouth daily. 90 tablet 1   aspirin 81 MG chewable tablet Chew 1 tablet (81 mg total)  by mouth daily. 30 tablet 2   atorvastatin (LIPITOR) 80 MG tablet TAKE 1 TABLET EVERY DAY 90 tablet 3   Blood Glucose Monitoring Suppl (ACCU-CHEK GUIDE) w/Device KIT USE AS DIRECTED 1 kit 0   citalopram (CELEXA) 20 MG tablet TAKE 1 TABLET EVERY DAY 90 tablet 3   donepezil (ARICEPT) 10 MG tablet TAKE 1 TABLET AT BEDTIME 90 tablet 3   glipiZIDE (GLUCOTROL XL) 10 MG 24 hr tablet Take 1 tablet (10 mg total) by mouth daily with breakfast. 90 tablet 1   levothyroxine (SYNTHROID) 50 MCG tablet TAKE 1 TABLET EVERY DAY 90 tablet 3   memantine (NAMENDA) 10 MG tablet TAKE 1 TABLET TWICE DAILY (Patient taking differently: Take 10 mg by mouth 2 (two) times daily.) 180 tablet 3   polyvinyl alcohol (LIQUIFILM TEARS) 1.4 % ophthalmic solution Place 1 drop into the left eye as needed for dry eyes. 15 mL 0   Vibegron (GEMTESA) 75 MG TABS Take 75 mg by mouth daily.     pantoprazole (PROTONIX) 40 MG tablet Take 1 tablet (40 mg total) by mouth daily for 14 days. 14 tablet 0   No current facility-administered medications on file prior to visit.    BP 120/60 (BP Location: Left Arm, Patient Position: Sitting, Cuff Size: Normal)   Pulse 75   Temp 98.3 F (36.8 C) (Oral)   Ht 5\' 9"  (1.753 m)   Wt 183 lb 3.2 oz (83.1 kg)   SpO2 96%   BMI 27.05 kg/m       Objective:   Physical Exam Vitals and nursing note reviewed.  Constitutional:      General: He is not in acute distress.    Appearance: Normal appearance. He is not ill-appearing.  HENT:      Head: Normocephalic and atraumatic.     Right Ear: Tympanic membrane, ear canal and external ear normal. There is no impacted cerumen.     Left Ear: Tympanic membrane, ear canal and external ear normal. There is no impacted cerumen.     Nose: Nose normal. No congestion or rhinorrhea.     Mouth/Throat:     Mouth: Mucous membranes are moist.     Pharynx: Oropharynx is clear.  Eyes:     Extraocular Movements: Extraocular movements intact.     Conjunctiva/sclera: Conjunctivae normal.     Pupils: Pupils are equal, round, and reactive to light.  Neck:     Vascular: No carotid bruit.  Cardiovascular:     Rate and Rhythm: Normal rate and regular rhythm.     Pulses: Normal pulses.     Heart sounds: No murmur heard.    No friction rub. No gallop.  Pulmonary:     Effort: Pulmonary effort is normal.     Breath sounds: Normal breath sounds.  Abdominal:     General: Abdomen is flat. Bowel sounds are normal. There is no distension.     Palpations: Abdomen is soft. There is no mass.     Tenderness: There is no abdominal tenderness. There is no guarding or rebound.     Hernia: No hernia is present.  Musculoskeletal:        General: Normal range of motion.     Cervical back: Normal range of motion and neck supple.     Comments: Mild residual left sided weakness   Lymphadenopathy:     Cervical: No cervical adenopathy.  Skin:    General: Skin is warm and dry.     Capillary Refill: Capillary  refill takes less than 2 seconds.  Neurological:     General: No focal deficit present.     Mental Status: He is alert and oriented to person, place, and time.     Cranial Nerves: No facial asymmetry.     Comments: + Aphasia, he only speaks in short sentences with a few words, has hesitancy with speaking. + mild dysarthria.     Psychiatric:        Mood and Affect: Mood normal.        Speech: Speech is delayed.        Behavior: Behavior normal.        Thought Content: Thought content normal.         Cognition and Memory: Cognition is not impaired. Memory is not impaired.        Judgment: Judgment normal.           Assessment & Plan:  1. Routine general medical examination at a health care facility Today patient counseled on age appropriate routine health concerns for screening and prevention, each reviewed and up to date or declined. Immunizations reviewed and up to date or declined. Labs ordered and reviewed. Risk factors for depression reviewed and negative. Hearing function and visual acuity are intact. ADLs screened and addressed as needed. Functional ability and level of safety reviewed and appropriate. Education, counseling and referrals performed based on assessed risks today. Patient provided with a copy of personalized plan for preventive services. - Flu shot given today   2. Uncontrolled type 2 diabetes mellitus with hyperglycemia (HCC) -Patient's son and wife were shown how to use insulin pen properly, dial up the insulin dose and give to the patient.  Will keep him on Lantus 10 units daily.  Trulicity will be renewed early next month when he is out of the donut hole.  He can continue on glipizide  - Follow up in 3 months - Insulin Pen Needle (PEN NEEDLES) 31G X 5 MM MISC; Use with insulin pen  Dispense: 90 each; Refill: 3 - Lipid panel; Future - TSH; Future - CBC; Future - Comprehensive metabolic panel; Future - Hemoglobin A1c; Future - Microalbumin/Creatinine Ratio, Urine; Future - Hemoglobin A1c - Comprehensive metabolic panel - CBC - TSH - Lipid panel - Microalbumin/Creatinine Ratio, Urine  3. Primary hypertension - Controlled - Lipid panel; Future - TSH; Future - CBC; Future - Comprehensive metabolic panel; Future - Hemoglobin A1c; Future - Hemoglobin A1c - Comprehensive metabolic panel - CBC - TSH - Lipid panel  4. OAB (overactive bladder) - Continue Gemtesa  - Lipid panel; Future - TSH; Future - CBC; Future - Comprehensive metabolic panel;  Future - Hemoglobin A1c; Future - Hemoglobin A1c - Comprehensive metabolic panel - CBC - TSH - Lipid panel  5. Moderate dementia with behavioral disturbance (HCC) - Continue Aricept and Namenda - Follow up with Neurology as directed - Lipid panel; Future - TSH; Future - CBC; Future - Comprehensive metabolic panel; Future - Hemoglobin A1c; Future - Hemoglobin A1c - Comprehensive metabolic panel - CBC - TSH - Lipid panel  6. Hypothyroidism, unspecified type - Consider dose change of synthroid - Lipid panel; Future - TSH; Future - CBC; Future - Comprehensive metabolic panel; Future - Hemoglobin A1c; Future - Hemoglobin A1c - Comprehensive metabolic panel - CBC - TSH - Lipid panel  7. Cerebrovascular accident (CVA), unspecified mechanism (HCC) - Continue with ASA and lipitor   [name], NP

## 2023-07-15 NOTE — Addendum Note (Signed)
Addended by: Christy Sartorius on: 07/15/2023 04:27 PM   Modules accepted: Orders

## 2023-07-16 LAB — CBC
HCT: 34.7 % — ABNORMAL LOW (ref 39.0–52.0)
Hemoglobin: 11.5 g/dL — ABNORMAL LOW (ref 13.0–17.0)
MCHC: 33.3 g/dL (ref 30.0–36.0)
MCV: 90.6 fL (ref 78.0–100.0)
Platelets: 271 10*3/uL (ref 150.0–400.0)
RBC: 3.83 Mil/uL — ABNORMAL LOW (ref 4.22–5.81)
RDW: 14 % (ref 11.5–15.5)
WBC: 8.5 10*3/uL (ref 4.0–10.5)

## 2023-07-16 LAB — COMPREHENSIVE METABOLIC PANEL
ALT: 17 U/L (ref 0–53)
AST: 20 U/L (ref 0–37)
Albumin: 4 g/dL (ref 3.5–5.2)
Alkaline Phosphatase: 143 U/L — ABNORMAL HIGH (ref 39–117)
BUN: 24 mg/dL — ABNORMAL HIGH (ref 6–23)
CO2: 31 meq/L (ref 19–32)
Calcium: 9.1 mg/dL (ref 8.4–10.5)
Chloride: 101 meq/L (ref 96–112)
Creatinine, Ser: 1.47 mg/dL (ref 0.40–1.50)
GFR: 43.63 mL/min — ABNORMAL LOW (ref >60.00)
Glucose, Bld: 157 mg/dL — ABNORMAL HIGH (ref 70–99)
Potassium: 3.9 meq/L (ref 3.5–5.1)
Sodium: 139 meq/L (ref 135–145)
Total Bilirubin: 0.4 mg/dL (ref 0.2–1.2)
Total Protein: 7.1 g/dL (ref 6.0–8.3)

## 2023-07-16 LAB — LIPID PANEL
Cholesterol: 129 mg/dL (ref 0–200)
HDL: 40.5 mg/dL (ref >39.00)
LDL Cholesterol: 74 mg/dL (ref 0–99)
NonHDL: 88.88
Total CHOL/HDL Ratio: 3
Triglycerides: 74 mg/dL (ref 0.0–149.0)
VLDL: 14.8 mg/dL (ref 0.0–40.0)

## 2023-07-16 LAB — HEMOGLOBIN A1C: Hgb A1c MFr Bld: 8.5 % — ABNORMAL HIGH (ref 4.6–6.5)

## 2023-07-16 LAB — TSH: TSH: 5.03 u[IU]/mL (ref 0.35–5.50)

## 2023-07-17 ENCOUNTER — Other Ambulatory Visit: Payer: Self-pay | Admitting: Adult Health

## 2023-07-17 ENCOUNTER — Other Ambulatory Visit: Payer: Self-pay | Admitting: Physician Assistant

## 2023-07-17 DIAGNOSIS — E890 Postprocedural hypothyroidism: Secondary | ICD-10-CM

## 2023-07-29 DIAGNOSIS — B351 Tinea unguium: Secondary | ICD-10-CM | POA: Diagnosis not present

## 2023-07-29 DIAGNOSIS — L84 Corns and callosities: Secondary | ICD-10-CM | POA: Diagnosis not present

## 2023-07-29 DIAGNOSIS — M79676 Pain in unspecified toe(s): Secondary | ICD-10-CM | POA: Diagnosis not present

## 2023-07-29 DIAGNOSIS — E1142 Type 2 diabetes mellitus with diabetic polyneuropathy: Secondary | ICD-10-CM | POA: Diagnosis not present

## 2023-08-16 ENCOUNTER — Telehealth: Payer: Self-pay | Admitting: Adult Health

## 2023-08-16 NOTE — Telephone Encounter (Signed)
 Patient dropped off document FMLA, to be filled out by provider. Patient requested to send it back via Fax within 7-days. Document is located in providers tray at front office.Please advise at  Fax [phone]

## 2023-08-17 NOTE — Telephone Encounter (Signed)
 Noted.

## 2023-08-18 DIAGNOSIS — Z0279 Encounter for issue of other medical certificate: Secondary | ICD-10-CM

## 2023-08-19 NOTE — Telephone Encounter (Signed)
 Noted.

## 2023-08-20 NOTE — Telephone Encounter (Signed)
 Called pt and spouse to advised that the form is ready for pick up but no answer.

## 2023-08-27 ENCOUNTER — Telehealth: Payer: Self-pay

## 2023-08-27 NOTE — Progress Notes (Signed)
   08/27/2023  Patient ID: Scott Vasquez, male   DOB: 1941/01/28, 83 y.o.   MRN: [id number]  Attempted to contact patient to schedule appointment for diabetes management. Unable to leave a voicemail.  Sherrill Raring, PharmD Clinical Pharmacist [phone]

## 2023-09-06 ENCOUNTER — Telehealth: Payer: Self-pay

## 2023-09-06 NOTE — Progress Notes (Signed)
   09/06/2023  Patient ID: Scott Vasquez, male   DOB: 09-Apr-1941, 83 y.o.   MRN: [id number]  Spoke with patient on telephone to inquire if he has restarted Trulicity. Unable to restart due to cost. Reviewed household size and income and should qualify to receive Ozempic from [facility] at no charge.  PCP approves change.  Submitted online application with patient consent. Will keep patient updated on application status.  Sherrill Raring, PharmD Clinical Pharmacist [phone]

## 2023-09-07 ENCOUNTER — Telehealth: Payer: Self-pay

## 2023-09-07 NOTE — Progress Notes (Signed)
   09/07/2023  Patient ID: Scott Vasquez, male   DOB: 1941/08/06, 83 y.o.   MRN: [id number]  Patient has been approved to receive Ozempic through [facility] PAP through 08/09/24. Contacted patient and notified spouse. May take up to 1 month for the first shipment to arrive, in the meantime, patient will continue with current medication therapy.  Agree to follow up phone call 11/03/23   Sherrill Raring, PharmD Clinical Pharmacist [phone]

## 2023-09-30 ENCOUNTER — Ambulatory Visit: Payer: Self-pay | Admitting: Adult Health

## 2023-09-30 ENCOUNTER — Telehealth: Payer: Self-pay | Admitting: Adult Health

## 2023-09-30 NOTE — Telephone Encounter (Signed)
Patient wife is needing a call back from the nurse regarding what kind of sheets the patients needs

## 2023-09-30 NOTE — Telephone Encounter (Signed)
Chief Complaint: Weakness Symptoms: Weakness, increased lethargy, incontinence Frequency: since Tuesday Pertinent Negatives: Patient denies fever, back pain, abdominal pain, pain with urination Disposition: [] ED /[] Urgent Care (no appt availability in office) / [x] Appointment(In office/virtual)/ []  [facility]/ [] Home Care/ [] Refused Recommended Disposition /[] [facility]/ []  Follow-up with PCP Additional Notes: Patient's wife called in stating she is concerned because patient has been newly weak since Tuesday. Patient's wife states that patient was out and about up until Monday and Tuesday he started becoming very weak, having more trouble ambulating, was falling asleep very often throughout the day (which is abnormal for him) and has been incontinent throughout the night in his depends (also abnormal for him). Wife states patient's mental status doesn't seem to be different from his baseline, and patient hasn't complained of pain with urination, back pain, or abdominal pain. Patient appt created for tomorrow for further assessment.    Copied from CRM [id number]. Topic: Clinical - Red Word Triage >> Sep 30, 2023  4:27 PM Fuller Mandril wrote: Red Word that prompted transfer to Nurse Triage: Weakness worsening, very concerning. Has appt 3/5 but needs to be seen sooner. Also wears depends at night and have been soaked this week when he wakes up. Reason for Disposition  [1] MODERATE weakness (i.e., interferes with work, school, normal activities) AND [2] persists > 3 days  Answer Assessment - Initial Assessment Questions 1. DESCRIPTION: "Describe how you are feeling."     Patient is sleeping more often, weaker with walking 2. SEVERITY: "How bad is it?"  "Can you stand and walk?"   - MILD (0-3): Feels weak or tired, but does not interfere with work, school or normal activities.   - MODERATE (4-7): Able to stand and walk; weakness interferes with work, school, or normal activities.    - SEVERE (8-10): Unable to stand or walk; unable to do usual activities.     Moderate  3. ONSET: "When did these symptoms begin?" (e.g., hours, days, weeks, months)     Tuesday 4. CAUSE: "What do you think is causing the weakness or fatigue?" (e.g., not drinking enough fluids, medical problem, trouble sleeping)     Unsure 5. NEW MEDICINES:  "Have you started on any new medicines recently?" (e.g., opioid pain medicines, benzodiazepines, muscle relaxants, antidepressants, antihistamines, neuroleptics, beta blockers)     No 6. OTHER SYMPTOMS: "Do you have any other symptoms?" (e.g., chest pain, fever, cough, SOB, vomiting, diarrhea, bleeding, other areas of pain)     Increased urination  Protocols used: Weakness (Generalized) and Fatigue-A-AH

## 2023-10-01 ENCOUNTER — Encounter: Payer: Self-pay | Admitting: Family Medicine

## 2023-10-01 ENCOUNTER — Ambulatory Visit (INDEPENDENT_AMBULATORY_CARE_PROVIDER_SITE_OTHER): Payer: Medicare HMO | Admitting: Family Medicine

## 2023-10-01 ENCOUNTER — Other Ambulatory Visit: Payer: Self-pay | Admitting: Adult Health

## 2023-10-01 VITALS — BP 100/50 | HR 73 | Temp 98.5°F | Resp 16 | Ht 69.0 in | Wt 179.2 lb

## 2023-10-01 DIAGNOSIS — J3489 Other specified disorders of nose and nasal sinuses: Secondary | ICD-10-CM

## 2023-10-01 DIAGNOSIS — N390 Urinary tract infection, site not specified: Secondary | ICD-10-CM | POA: Diagnosis not present

## 2023-10-01 DIAGNOSIS — R531 Weakness: Secondary | ICD-10-CM

## 2023-10-01 DIAGNOSIS — I10 Essential (primary) hypertension: Secondary | ICD-10-CM

## 2023-10-01 DIAGNOSIS — E1165 Type 2 diabetes mellitus with hyperglycemia: Secondary | ICD-10-CM

## 2023-10-01 MED ORDER — AMLODIPINE BESYLATE 10 MG PO TABS: 5.0000 mg | ORAL_TABLET | Freq: Every day | ORAL | Status: DC

## 2023-10-01 NOTE — Patient Instructions (Signed)
A few things to remember from today's visit:  Weakness generalized - Plan: Comprehensive metabolic panel, CBC, Urinalysis with Culture Reflex  Essential hypertension - Plan: Comprehensive metabolic panel  Hypothyroidism, unspecified type  Decrease Amlodipine from 10 mg to 5 mg daily. Monitor blood pressure at home. Monitor for new symptoms. Follow up with Kandee Keen in 10-14 days.  If you need refills for medications you take chronically, please call your pharmacy. Do not use My Chart to request refills or for acute issues that need immediate attention. If you send a my chart message, it may take a few days to be addressed, specially if I am not in the office.  Please be sure medication list is accurate. If a new problem present, please set up appointment sooner than planned today.

## 2023-10-01 NOTE — Progress Notes (Signed)
 ACUTE VISIT Chief Complaint  Patient presents with   Extremity Weakness    Pt is with spouse, reports leg weakness. Wife reports his leg gives out on 2/18 when they were going out.    Urinary Incontinence    Wife reports pt is dementia and wants to discuss if urinary incontinence is part of it.    Nasal Congestion    Pt c/o runny nose. Sx started on Tuesday. Denied sore throat, fever, cough, headache, bodyache.    HPI: Scott Vasquez is a 83 y.o. male with a PMHx significant for HTN, stroke with cerebral ischemia, DM II, hypothyroidism, CKD III, HLD, iron deficiency anemia, and B12 deficiency, among some, who is here today with his family complaining of weakness and urinary incontinence.  Hx provided by daughter and son and later during visit his wife came into the room.  Family reports generalized weakness and incontinence for about a week.  Scott Vasquez has worn depends for a couple of years, but his daughter says has stopped getting out of bed to urinate through the night over the last week, and has been wetting the bed.   His daughter also mentions Scott Vasquez has been sleeping a lot lately. She thinks Scott Vasquez is too sleepy at night to get up and use the bathroom. Family reports hx of dementia and Scott Vasquez is at his baseline. Ambulatory with a cane. No recent falls. His appetite is "good."  Pertinent negatives include fever,chills, change sin appetite, CP, SOB, dysuria, gross hematuria, changes in his urine stream, or changes in his bowel habits.  Lab Results  Component Value Date   TSH 5.03 07/15/2023   Clear rhinorrhea: According to his son, Scott Vasquez has had this problem intermittently since thyroidectomy in 2016. No sick contact. Scott Vasquez has a try OTC medication but family wonders if Scott Vasquez can try OTC antihistamines.  Hypertension: BP mildly low today.  Currently on amlodipine 10 mg daily. Not taking lisinopril.  Scott Vasquez has not been checking his BP at home. Today in the office it is 100/50.   Family has not  noted headache,exertional chest pain, dyspnea,  focal weakness, or edema.  Lab Results  Component Value Date   CREATININE 1.47 07/15/2023   BUN 24 (H) 07/15/2023   NA 139 07/15/2023   K 3.9 07/15/2023   CL 101 07/15/2023   CO2 31 07/15/2023   Diabetes Mellitus II:  Scott Vasquez is checking his BG. After eating yesterday it was around 200.  Currently on Glipizide 10 mg daily, Lantus 10 units daily, and Ozempic 0.25 mg daily. No longer taking Trulicity.   Lab Results  Component Value Date   HGBA1C 8.5 (H) 07/15/2023   Review of Systems  Constitutional:  Positive for activity change. Negative for appetite change, chills and fever.  HENT:  Negative for facial swelling, mouth sores, nosebleeds and sore throat.   Respiratory:  Negative for cough and wheezing.   Gastrointestinal:  Negative for abdominal pain, nausea and vomiting.  Skin:  Negative for rash.  Neurological:  Negative for seizures and syncope.  Psychiatric/Behavioral:  Negative for hallucinations.   See other pertinent positives and negatives in HPI.  Current Outpatient Medications on File Prior to Visit  Medication Sig Dispense Refill   ACCU-CHEK AVIVA PLUS test strip TEST BLOOD SUGAR TWICE DAILY 200 strip 3   aspirin 81 MG chewable tablet Chew 1 tablet (81 mg total) by mouth daily. 30 tablet 2   atorvastatin (LIPITOR) 80 MG tablet TAKE 1 TABLET EVERY DAY 90 tablet  3   Blood Glucose Monitoring Suppl (ACCU-CHEK GUIDE) w/Device KIT USE AS DIRECTED 1 kit 0   citalopram (CELEXA) 20 MG tablet TAKE 1 TABLET EVERY DAY 90 tablet 3   donepezil (ARICEPT) 10 MG tablet TAKE 1 TABLET AT BEDTIME 90 tablet 3   levothyroxine (SYNTHROID) 50 MCG tablet TAKE 1 TABLET EVERY DAY 90 tablet 3   memantine (NAMENDA) 10 MG tablet TAKE 1 TABLET TWICE DAILY 180 tablet 3   Semaglutide (OZEMPIC, 0.25 OR 0.5 MG/DOSE, [place]) Inject 0.25 mg into the skin once a week. Waiting to start once PAP product arrives     insulin glargine (LANTUS SOLOSTAR) 100 UNIT/ML  Solostar Pen Inject 10 Units into the skin daily. (Patient not taking: Reported on 10/01/2023)     Insulin Pen Needle (PEN NEEDLES) 31G X 5 MM MISC Use with insulin pen (Patient not taking: Reported on 10/01/2023) 90 each 3   No current facility-administered medications on file prior to visit.    Past Medical History:  Diagnosis Date   Anemia    iron deficiency   Diabetes mellitus    type II   Diverticulosis of colon    Hypertension    Stroke (HCC)    numbness in left hand   Thyroid nodule    Allergies  Allergen Reactions   Actos [Pioglitazone] Other (See Comments)    Diarrhea    Metformin And Related Other (See Comments)    Diarrhea      Social History   Socioeconomic History   Marital status: Married    Spouse name: Not on file   Number of children: 2   Years of education: 8   Highest education level: Not on file  Occupational History   Occupation: retired    Associate Professor: [employer]  Tobacco Use   Smoking status: Former    Current packs/day: 0.00    Average packs/day: 0.5 packs/day for 20.0 years (10.0 ttl pk-yrs)    Types: Cigarettes    Start date: 65    Quit date: 08/10/1958    Years since quitting: 65.1   Smokeless tobacco: Never  Vaping Use   Vaping status: Never Used  Substance and Sexual Activity   Alcohol use: No   Drug use: No   Sexual activity: Not on file  Other Topics Concern   Not on file  Social History Narrative   Pt is R handed   Lives in single story home with his wife, Scott Vasquez   2 adult children   8th grade education   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[employer]   Married 56 years   Social Drivers of [employer]: Low Risk  (05/09/2021)   Overall Financial Resource Strain (CARDIA)    Difficulty of Paying Living Expenses: Not hard at all  Food Insecurity: No Food Insecurity (03/15/2023)   Hunger Vital Sign    Worried About Running Out of Food in the Last Year: Never true    Ran Out of Food in the Last Year: Never true   Transportation Needs: No Transportation Needs (03/15/2023)   PRAPARE - Administrator, Civil Service (Medical): No    Lack of Transportation (Non-Medical): No  Physical Activity: Not on file  Stress: Not on file  Social Connections: Not on file    Vitals:   10/01/23 1415  BP: (!) 100/50  Pulse: 73  Resp: 16  Temp: 98.5 F (36.9 C)  SpO2: 94%   Body mass index is 26.46 kg/m.  Physical  Exam Vitals and nursing note reviewed.  Constitutional:      General: Scott Vasquez is not in acute distress.    Appearance: Scott Vasquez is well-developed.  HENT:     Head: Normocephalic and atraumatic.     Nose: Rhinorrhea (Clear) present.     Mouth/Throat:     Mouth: Mucous membranes are dry.     Pharynx: Oropharynx is clear. Uvula midline.  Eyes:     Conjunctiva/sclera: Conjunctivae normal.  Cardiovascular:     Rate and Rhythm: Normal rate and regular rhythm.     Heart sounds: No murmur heard. Pulmonary:     Effort: Pulmonary effort is normal. No respiratory distress.     Breath sounds: Normal breath sounds.  Abdominal:     Palpations: Abdomen is soft. There is no hepatomegaly or mass.     Tenderness: There is no abdominal tenderness.  Musculoskeletal:     Right lower leg: No edema.     Left lower leg: No edema.  Lymphadenopathy:     Cervical: No cervical adenopathy.  Skin:    General: Skin is warm.     Findings: No erythema or rash.  Neurological:     Mental Status: Scott Vasquez is alert. Mental status is at baseline.     Comments: Gait assisted with a cane.   Psychiatric:        Mood and Affect: Mood is not anxious or depressed.        Speech: Scott Vasquez is noncommunicative.        Behavior: Behavior is cooperative.   ASSESSMENT AND PLAN:  Scott Vasquez was seen today for weakness.  Lab Results  Component Value Date   NA 144 10/01/2023   CL 103 10/01/2023   K 4.8 10/01/2023   CO2 24 10/01/2023   BUN 30 (H) 10/01/2023   CREATININE 1.75 (H) 10/01/2023   EGFR 38 (L) 10/01/2023   CALCIUM 9.4  10/01/2023   ALBUMIN 4.1 10/01/2023   GLUCOSE 261 (H) 10/01/2023   Lab Results  Component Value Date   ALT 23 10/01/2023   AST 24 10/01/2023   ALKPHOS 235 (H) 10/01/2023   BILITOT 0.4 10/01/2023   Lab Results  Component Value Date   WBC 8.9 10/01/2023   HGB 12.0 (L) 10/01/2023   HCT 38.7 10/01/2023   MCV 92 10/01/2023   PLT 280 10/01/2023   Weakness generalized Family describes problem as weakness but reports mainly fatigue, no MS changes or focal weakness.  We discussed possible etiologies.  It seems like Scott Vasquez already has some hx of incontinence, her daughter thinks Scott Vasquez does not get up to use bathroom through the night as Scott Vasquez did before because of fatigue. MS reported as his baseline. Examination today does not suggest a serious process. Monitor for new symptoms. Ensure adequate hydration. Instructed about warning signs.  -     Comprehensive metabolic panel; Future -     CBC; Future -     Urinalysis w microscopic + reflex cultur; Future  Essential hypertension BP today 100/50, this could be contributing to above problem. Scott Vasquez is no longer on lisinopril, last taken about 6 months ago. Recommend decreasing dose of amlodipine from 10 mg to 5 mg. Start monitoring BP regularly. Follow-up with PCP in 2 weeks, before if needed.  -     Comprehensive metabolic panel; Future  Rhinorrhea This is a chronic problem, according to family started after thyroid surgery, 2016. I do not recommend OTC antihistaminics at this time. Nasal saline irrigations if Scott Vasquez allows  family to do it will help.  Return in about 2 weeks (around 10/15/2023) for Weakness with PCP.  I, Rolla Etienne Wierda, acting as a scribe for Denice Cardon [place], MD., have documented all relevant documentation on the behalf of Bindi Klomp [place], MD, as directed by  Nazifa Trinka [place], MD while in the presence of Toneisha Savary [place], MD.   I, Meshilem Machuca [place], MD, have reviewed all documentation for this visit. The documentation on 10/01/23 for the exam,  diagnosis, procedures, and orders are all accurate and complete.  Amandajo Gonder G. [place], MD  [facility]. [place] office.

## 2023-10-01 NOTE — Telephone Encounter (Signed)
Pt spouse notified that pt Ozempic is ready for pick up.

## 2023-10-02 LAB — CBC
Hematocrit: 38.7 % (ref 37.5–51.0)
Hemoglobin: 12 g/dL — ABNORMAL LOW (ref 13.0–17.7)
MCH: 28.6 pg (ref 26.6–33.0)
MCHC: 31 g/dL — ABNORMAL LOW (ref 31.5–35.7)
MCV: 92 fL (ref 79–97)
Platelets: 280 10*3/uL (ref 150–450)
RBC: 4.19 x10E6/uL (ref 4.14–5.80)
RDW: 12.3 % (ref 11.6–15.4)
WBC: 8.9 10*3/uL (ref 3.4–10.8)

## 2023-10-02 LAB — COMPREHENSIVE METABOLIC PANEL
ALT: 23 [IU]/L (ref 0–44)
AST: 24 [IU]/L (ref 0–40)
Albumin: 4.1 g/dL (ref 3.7–4.7)
Alkaline Phosphatase: 235 [IU]/L — ABNORMAL HIGH (ref 44–121)
BUN/Creatinine Ratio: 17 (ref 10–24)
BUN: 30 mg/dL — ABNORMAL HIGH (ref 8–27)
Bilirubin Total: 0.4 mg/dL (ref 0.0–1.2)
CO2: 24 mmol/L (ref 20–29)
Calcium: 9.4 mg/dL (ref 8.6–10.2)
Chloride: 103 mmol/L (ref 96–106)
Creatinine, Ser: 1.75 mg/dL — ABNORMAL HIGH (ref 0.76–1.27)
Globulin, Total: 2.7 g/dL (ref 1.5–4.5)
Glucose: 261 mg/dL — ABNORMAL HIGH (ref 70–99)
Potassium: 4.8 mmol/L (ref 3.5–5.2)
Sodium: 144 mmol/L (ref 134–144)
Total Protein: 6.8 g/dL (ref 6.0–8.5)
eGFR: 38 mL/min/{1.73_m2} — ABNORMAL LOW (ref >59)

## 2023-10-02 MED ORDER — SULFAMETHOXAZOLE-TRIMETHOPRIM 800-160 MG PO TABS: 1.0000 | ORAL_TABLET | Freq: Two times a day (BID) | ORAL | 0 refills | Status: AC

## 2023-10-03 LAB — URINALYSIS W MICROSCOPIC + REFLEX CULTURE
Bilirubin Urine: NEGATIVE
Glucose, UA: NEGATIVE
Hgb urine dipstick: NEGATIVE
Nitrites, Initial: NEGATIVE
RBC / HPF: NONE SEEN /HPF (ref 0–2)
Specific Gravity, Urine: 1.022 (ref 1.001–1.035)
Squamous Epithelial / HPF: NONE SEEN /HPF (ref <=5)
pH: <=5 (ref 5.0–8.0)

## 2023-10-03 LAB — URINE CULTURE
MICRO NUMBER:: 16116664
Result:: NO GROWTH
SPECIMEN QUALITY:: ADEQUATE

## 2023-10-03 LAB — CULTURE INDICATED

## 2023-10-06 NOTE — Telephone Encounter (Signed)
 There are no appt. In office until Friday. Please advise if pt needs to go to [facility] or ED etc.

## 2023-10-06 NOTE — Telephone Encounter (Signed)
 Spoke to pt spouse and she wanted to know if there were sheets that could protect her mattress. Pt spouse stated that pt has seen wetting the bed and she just needed advise on the product to get. I advised waterproof mattress protectors. Pt spouse verbalized understanding.

## 2023-10-06 NOTE — Telephone Encounter (Signed)
 Pt is scheduled for 3/6. Okay to wait?

## 2023-10-07 NOTE — Telephone Encounter (Signed)
 Patient spouse and son notified of update  and verbalized understanding.

## 2023-10-13 ENCOUNTER — Ambulatory Visit: Payer: Medicare HMO | Admitting: Adult Health

## 2023-10-13 NOTE — Progress Notes (Deleted)
 Subjective:    Patient ID: Scott Vasquez, male    DOB: November 25, 1940, 83 y.o.   MRN: [id number]  HPI 83 year old male who  has a past medical history of Anemia, Diabetes mellitus, Diverticulosis of colon, Hypertension, Stroke (HCC), and Thyroid nodule.  He presents to the office today for follow up regarding DM and HTN   Diabetes Mellitus Type 2 - He is currently managed with Lantus 10 units daily, glipizide 10 mg ER daily and was placed on Ozempic 0.25 through PAP a few weeks ago.   Lab Results  Component Value Date   HGBA1C 8.5 (H) 07/15/2023   HGBA1C 9.1 (H) 04/01/2023   HGBA1C 9.4 (A) 12/31/2022    HTN - His Norvasc was decreased to 5 mg about 2 weeks ago by another provider he was seen by and It was noticed that he was hypotensive and feeling fatigued.   He denies dizziness, lightheadedness, chest pain, or shortness of breath BP Readings from Last 3 Encounters:  10/01/23 (!) 100/50  07/15/23 120/60  06/08/23 106/64     Review of Systems See HPI   Past Medical History:  Diagnosis Date   Anemia    iron deficiency   Diabetes mellitus    type II   Diverticulosis of colon    Hypertension    Stroke (HCC)    numbness in left hand   Thyroid nodule     Social History   Socioeconomic History   Marital status: Married    Spouse name: Not on file   Number of children: 2   Years of education: 8   Highest education level: Not on file  Occupational History   Occupation: retired    Associate Professor: [employer]  Tobacco Use   Smoking status: Former    Current packs/day: 0.00    Average packs/day: 0.5 packs/day for 20.0 years (10.0 ttl pk-yrs)    Types: Cigarettes    Start date: 65    Quit date: 08/10/1958    Years since quitting: 65.2   Smokeless tobacco: Never  Vaping Use   Vaping status: Never Used  Substance and Sexual Activity   Alcohol use: No   Drug use: No   Sexual activity: Not on file  Other Topics Concern   Not on file  Social History Narrative   Pt is  R handed   Lives in single story home with his wife, [name]   2 adult children   8th grade education   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[employer]   Married 56 years   Social Drivers of [employer]: Low Risk  (05/09/2021)   Overall Financial Resource Strain (CARDIA)    Difficulty of Paying Living Expenses: Not hard at all  Food Insecurity: No Food Insecurity (03/15/2023)   Hunger Vital Sign    Worried About Running Out of Food in the Last Year: Never true    Ran Out of Food in the Last Year: Never true  Transportation Needs: No Transportation Needs (03/15/2023)   PRAPARE - Administrator, Civil Service (Medical): No    Lack of Transportation (Non-Medical): No  Physical Activity: Not on file  Stress: Not on file  Social Connections: Not on file  Intimate Partner Violence: Patient Unable To Answer (03/15/2023)   Humiliation, Afraid, Rape, and Kick questionnaire    Fear of Current or Ex-Partner: Patient unable to answer    Emotionally Abused: Patient unable to answer    Physically Abused: Patient  unable to answer    Sexually Abused: Patient unable to answer    Past Surgical History:  Procedure Laterality Date   BACK SURGERY     CATARACT EXTRACTION, BILATERAL     SHOULDER SURGERY  08/2007   Supple   THYROID LOBECTOMY Left 04/29/2015   THYROID LOBECTOMY Left 04/29/2015   Procedure: THYROID LOBECTOMY;  Surgeon: Darnell Level, MD;  Location: [facility] OR;  Service: General;  Laterality: Left;    Family History  Problem Relation Age of Onset   Diabetes Other    Diabetes Father        Died from diabetic coma in 11-07-1970   Hypertension Father    Stroke Mother        Died in 27   Hypertension Mother     Allergies  Allergen Reactions   Actos [Pioglitazone] Other (See Comments)    Diarrhea    Metformin And Related Other (See Comments)    Diarrhea      Current Outpatient Medications on File Prior to Visit  Medication Sig Dispense Refill   ACCU-CHEK AVIVA PLUS  test strip TEST BLOOD SUGAR TWICE DAILY 200 strip 3   amLODipine (NORVASC) 10 MG tablet Take 0.5 tablets (5 mg total) by mouth daily.     aspirin 81 MG chewable tablet Chew 1 tablet (81 mg total) by mouth daily. 30 tablet 2   atorvastatin (LIPITOR) 80 MG tablet TAKE 1 TABLET EVERY DAY 90 tablet 3   Blood Glucose Monitoring Suppl (ACCU-CHEK GUIDE) w/Device KIT USE AS DIRECTED 1 kit 0   citalopram (CELEXA) 20 MG tablet TAKE 1 TABLET EVERY DAY 90 tablet 3   donepezil (ARICEPT) 10 MG tablet TAKE 1 TABLET AT BEDTIME 90 tablet 3   glipiZIDE (GLUCOTROL XL) 10 MG 24 hr tablet TAKE 1 TABLET EVERY DAY WITH BREAKFAST 90 tablet 3   insulin glargine (LANTUS SOLOSTAR) 100 UNIT/ML Solostar Pen Inject 10 Units into the skin daily. (Patient not taking: Reported on 10/01/2023)     Insulin Pen Needle (PEN NEEDLES) 31G X 5 MM MISC Use with insulin pen (Patient not taking: Reported on 10/01/2023) 90 each 3   levothyroxine (SYNTHROID) 50 MCG tablet TAKE 1 TABLET EVERY DAY 90 tablet 3   memantine (NAMENDA) 10 MG tablet TAKE 1 TABLET TWICE DAILY 180 tablet 3   Semaglutide (OZEMPIC, 0.25 OR 0.5 MG/DOSE, [place]) Inject 0.25 mg into the skin once a week. Waiting to start once PAP product arrives     No current facility-administered medications on file prior to visit.    There were no vitals taken for this visit.      Objective:   Physical Exam Nursing note reviewed.  Constitutional:      Appearance: Normal appearance.  Cardiovascular:     Rate and Rhythm: Normal rate and regular rhythm.     Pulses: Normal pulses.     Heart sounds: Normal heart sounds.  Pulmonary:     Effort: Pulmonary effort is normal.     Breath sounds: Normal breath sounds.  Musculoskeletal:        General: Normal range of motion.  Skin:    General: Skin is warm and dry.  Neurological:     General: No focal deficit present.     Mental Status: He is alert and oriented to person, place, and time.  Psychiatric:        Mood and Affect: Mood  normal.        Behavior: Behavior normal.  Thought Content: Thought content normal.        Judgment: Judgment normal.           Assessment & Plan:

## 2023-10-28 DIAGNOSIS — M79676 Pain in unspecified toe(s): Secondary | ICD-10-CM | POA: Diagnosis not present

## 2023-10-28 DIAGNOSIS — B351 Tinea unguium: Secondary | ICD-10-CM | POA: Diagnosis not present

## 2023-10-28 DIAGNOSIS — L84 Corns and callosities: Secondary | ICD-10-CM | POA: Diagnosis not present

## 2023-10-28 DIAGNOSIS — E1142 Type 2 diabetes mellitus with diabetic polyneuropathy: Secondary | ICD-10-CM | POA: Diagnosis not present

## 2023-11-02 ENCOUNTER — Other Ambulatory Visit (INDEPENDENT_AMBULATORY_CARE_PROVIDER_SITE_OTHER)

## 2023-11-02 DIAGNOSIS — Z7985 Long-term (current) use of injectable non-insulin antidiabetic drugs: Secondary | ICD-10-CM

## 2023-11-02 DIAGNOSIS — E1165 Type 2 diabetes mellitus with hyperglycemia: Secondary | ICD-10-CM

## 2023-11-02 DIAGNOSIS — Z7984 Long term (current) use of oral hypoglycemic drugs: Secondary | ICD-10-CM

## 2023-11-02 NOTE — Progress Notes (Signed)
 11/02/2023 Name: Scott Vasquez MRN: [id number] DOB: 1941-04-16  Chief Complaint  Patient presents with   Diabetes   Medication Management   Hypertension    Scott Vasquez is a 83 y.o. year old male who presented for a telephone visit with wife [name].   They were referred to the pharmacist by their PCP for assistance in managing diabetes.    Subjective:  Care Team: Primary Care Provider: [name], NP ; Next Scheduled Visit: 12/16/23  Medication Access/Adherence  Current Pharmacy:  [facility] - [place], [place] - [address] [place] [address] Phone: [phone] Fax: [phone]  [facility] [place], [place] - [address] [place] [address] Phone: [phone] Fax: [phone]  [id number] - [place], [place] - [address] [facility] [address] [place] [address] Phone: [phone] Fax: [phone]   Patient reports affordability concerns with their medications: No  Patient reports access/transportation concerns to their pharmacy: No  Patient reports adherence concerns with their medications:  No     Diabetes:  Current medications: Ozempic 0.25mg , Glipizide 10mg  Medications tried in the past: Glimepiride, Humulin 70/30, Actos, Metformin, Januvia, Invokana  Current glucose readings: Unable to report specific readings at this time but denies any low BG readings  Observed patterns:  Patient denies hypoglycemic s/sx including dizziness, shakiness, sweating. Patient denies hyperglycemic symptoms including polyuria, polydipsia, polyphagia, nocturia, neuropathy, blurred vision.  Denies any issues tolerating current dose of Ozempic  Current medication access support: Ozempic through [facility] PAP  Hypertension:  Current medications: Amlodipine 5mg  Medications previously tried: Lisinopril  Patient  has a validated, automated, upper arm home BP cuff Current blood pressure readings readings: 130s/60s  Patient denies hypotensive s/sx including dizziness, lightheadedness.  Patient denies hypertensive symptoms including headache, chest pain, shortness of breath  Objective:  Lab Results  Component Value Date   HGBA1C 8.5 (H) 07/15/2023    Lab Results  Component Value Date   CREATININE 1.75 (H) 10/01/2023   BUN 30 (H) 10/01/2023   NA 144 10/01/2023   K 4.8 10/01/2023   CL 103 10/01/2023   CO2 24 10/01/2023    Lab Results  Component Value Date   CHOL 129 07/15/2023   HDL 40.50 07/15/2023   LDLCALC 74 07/15/2023   TRIG 74.0 07/15/2023   CHOLHDL 3 07/15/2023    Medications Reviewed Today     Reviewed by Sherrill Raring, RPH (Pharmacist) on 11/02/23 at 1020  Med List Status: <None>   Medication Order Taking? Sig Documenting Provider Last Dose Status Informant  ACCU-CHEK AVIVA PLUS test strip [id number] No TEST BLOOD SUGAR TWICE DAILY Nafziger, Kandee Keen, NP Taking Active Spouse/Significant Other  amLODipine (NORVASC) 10 MG tablet [id number]  Take 0.5 tablets (5 mg total) by mouth daily. [place], Betty G, MD  Active   aspirin 81 MG chewable tablet [id number] No Chew 1 tablet (81 mg total) by mouth daily. Tyrone Nine, MD Taking Active Spouse/Significant Other  atorvastatin (LIPITOR) 80 MG tablet [id number] No TAKE 1 TABLET EVERY DAY Nafziger, Kandee Keen, NP Taking Active Spouse/Significant Other  Blood Glucose Monitoring Suppl (ACCU-CHEK GUIDE) w/Device KIT [id number] No USE AS DIRECTED Nafziger, Kandee Keen, NP Taking Active Spouse/Significant Other  citalopram (CELEXA) 20 MG tablet [id number] No TAKE 1 TABLET EVERY DAY Marcos Eke, PA-C Taking Active   donepezil (ARICEPT) 10 MG tablet [id number]  No TAKE 1 TABLET AT BEDTIME Marcos Eke, PA-C Taking Active   glipiZIDE (GLUCOTROL XL) 10 MG 24 hr tablet [id number] No TAKE 1 TABLET EVERY DAY WITH BREAKFAST Nafziger, Kandee Keen, NP Taking Active    insulin glargine (LANTUS SOLOSTAR) 100 UNIT/ML Solostar Pen [id number] No Inject 10 Units into the skin daily.  Patient not taking: Reported on 10/01/2023   [provider] Not Taking Active   Insulin Pen Needle (PEN NEEDLES) 31G X 5 MM MISC [id number] No Use with insulin pen  Patient not taking: Reported on 10/01/2023   [name], NP Not Taking Active   levothyroxine (SYNTHROID) 50 MCG tablet [id number] No TAKE 1 TABLET EVERY DAY Nafziger, Cory, NP Taking Active   memantine (NAMENDA) 10 MG tablet [id number] No TAKE 1 TABLET TWICE DAILY Gwynneth Munson, Sung Amabile, PA-C Taking Active   Semaglutide (OZEMPIC, 0.25 OR 0.5 MG/DOSE, [place]) [id number] No Inject 0.25 mg into the skin once a week. Waiting to start once PAP product arrives [provider] Taking Active            Med Note Leitha Bleak, Milas Kocher   Tue Nov 02, 2023 10:20 AM) Started 10/03/23, increasing to 0.5mg  on 3/30              Assessment/Plan:   Diabetes: - Currently uncontrolled - Reviewed long term cardiovascular and renal outcomes of uncontrolled blood sugar - Reviewed goal A1c, goal fasting, and goal 2 hour post prandial glucose - Reviewed dietary modifications including low carb diet - Recommend to INCREASE Ozempic to 0.5mg  this Sunday when next dose is due  - Patient denies personal or family history of multiple endocrine neoplasia type 2, medullary thyroid cancer; personal history of pancreatitis or gallbladder disease. - Recommend to check glucose daily      Hypertension: - Currently controlled - Reviewed long term cardiovascular and renal outcomes of uncontrolled blood pressure - Reviewed appropriate blood pressure monitoring technique and reviewed goal blood pressure. Recommended to check home blood pressure and heart rate daily - Recommend to continue current medication therapy. If BP becomes uncontrolled, consider re-addition of low dose lisinopril back into patient's regimen     Follow Up Plan: 3-4  weeks  Sherrill Raring, PharmD Clinical Pharmacist [phone]

## 2023-11-03 ENCOUNTER — Other Ambulatory Visit: Payer: Medicare HMO

## 2023-11-05 ENCOUNTER — Encounter (HOSPITAL_COMMUNITY): Payer: Self-pay

## 2023-11-05 ENCOUNTER — Other Ambulatory Visit: Payer: Self-pay

## 2023-11-05 ENCOUNTER — Inpatient Hospital Stay (HOSPITAL_COMMUNITY)
Admission: EM | Admit: 2023-11-05 | Discharge: 2023-11-07 | DRG: 065 | Disposition: A | Discharge status: Home Health | Attending: Internal Medicine | Admitting: Internal Medicine

## 2023-11-05 DIAGNOSIS — I6381 Other cerebral infarction due to occlusion or stenosis of small artery: Principal | ICD-10-CM | POA: Diagnosis present

## 2023-11-05 DIAGNOSIS — N183 Chronic kidney disease, stage 3 unspecified: Secondary | ICD-10-CM | POA: Diagnosis present

## 2023-11-05 DIAGNOSIS — E1159 Type 2 diabetes mellitus with other circulatory complications: Secondary | ICD-10-CM

## 2023-11-05 DIAGNOSIS — F03A3 Unspecified dementia, mild, with mood disturbance: Secondary | ICD-10-CM | POA: Diagnosis not present

## 2023-11-05 DIAGNOSIS — Z87891 Personal history of nicotine dependence: Secondary | ICD-10-CM

## 2023-11-05 DIAGNOSIS — E1100 Type 2 diabetes mellitus with hyperosmolarity without nonketotic hyperglycemic-hyperosmolar coma (NKHHC): Secondary | ICD-10-CM | POA: Diagnosis not present

## 2023-11-05 DIAGNOSIS — Z7984 Long term (current) use of oral hypoglycemic drugs: Secondary | ICD-10-CM

## 2023-11-05 DIAGNOSIS — R29702 NIHSS score 2: Secondary | ICD-10-CM | POA: Diagnosis not present

## 2023-11-05 DIAGNOSIS — I69328 Other speech and language deficits following cerebral infarction: Secondary | ICD-10-CM | POA: Diagnosis not present

## 2023-11-05 DIAGNOSIS — H499 Unspecified paralytic strabismus: Secondary | ICD-10-CM | POA: Diagnosis present

## 2023-11-05 DIAGNOSIS — F03A0 Unspecified dementia, mild, without behavioral disturbance, psychotic disturbance, mood disturbance, and anxiety: Secondary | ICD-10-CM | POA: Diagnosis present

## 2023-11-05 DIAGNOSIS — I69322 Dysarthria following cerebral infarction: Secondary | ICD-10-CM | POA: Diagnosis not present

## 2023-11-05 DIAGNOSIS — Z79899 Other long term (current) drug therapy: Secondary | ICD-10-CM | POA: Diagnosis not present

## 2023-11-05 DIAGNOSIS — Z833 Family history of diabetes mellitus: Secondary | ICD-10-CM | POA: Diagnosis not present

## 2023-11-05 DIAGNOSIS — Z794 Long term (current) use of insulin: Secondary | ICD-10-CM | POA: Diagnosis not present

## 2023-11-05 DIAGNOSIS — I6782 Cerebral ischemia: Secondary | ICD-10-CM | POA: Diagnosis not present

## 2023-11-05 DIAGNOSIS — E1165 Type 2 diabetes mellitus with hyperglycemia: Secondary | ICD-10-CM | POA: Diagnosis present

## 2023-11-05 DIAGNOSIS — I129 Hypertensive chronic kidney disease with stage 1 through stage 4 chronic kidney disease, or unspecified chronic kidney disease: Secondary | ICD-10-CM | POA: Diagnosis present

## 2023-11-05 DIAGNOSIS — Z79890 Hormone replacement therapy (postmenopausal): Secondary | ICD-10-CM

## 2023-11-05 DIAGNOSIS — Z8679 Personal history of other diseases of the circulatory system: Secondary | ICD-10-CM | POA: Diagnosis present

## 2023-11-05 DIAGNOSIS — H532 Diplopia: Secondary | ICD-10-CM | POA: Diagnosis present

## 2023-11-05 DIAGNOSIS — I639 Cerebral infarction, unspecified: Principal | ICD-10-CM | POA: Diagnosis present

## 2023-11-05 DIAGNOSIS — N179 Acute kidney failure, unspecified: Secondary | ICD-10-CM | POA: Diagnosis present

## 2023-11-05 DIAGNOSIS — F03B18 Unspecified dementia, moderate, with other behavioral disturbance: Secondary | ICD-10-CM | POA: Diagnosis present

## 2023-11-05 DIAGNOSIS — G936 Cerebral edema: Secondary | ICD-10-CM | POA: Diagnosis not present

## 2023-11-05 DIAGNOSIS — E1122 Type 2 diabetes mellitus with diabetic chronic kidney disease: Secondary | ICD-10-CM | POA: Diagnosis not present

## 2023-11-05 DIAGNOSIS — Z7902 Long term (current) use of antithrombotics/antiplatelets: Secondary | ICD-10-CM

## 2023-11-05 DIAGNOSIS — Z8249 Family history of ischemic heart disease and other diseases of the circulatory system: Secondary | ICD-10-CM

## 2023-11-05 DIAGNOSIS — Z8673 Personal history of transient ischemic attack (TIA), and cerebral infarction without residual deficits: Secondary | ICD-10-CM | POA: Diagnosis not present

## 2023-11-05 DIAGNOSIS — E119 Type 2 diabetes mellitus without complications: Secondary | ICD-10-CM

## 2023-11-05 DIAGNOSIS — R29818 Other symptoms and signs involving the nervous system: Secondary | ICD-10-CM | POA: Diagnosis not present

## 2023-11-05 DIAGNOSIS — Z823 Family history of stroke: Secondary | ICD-10-CM | POA: Diagnosis not present

## 2023-11-05 DIAGNOSIS — E039 Hypothyroidism, unspecified: Secondary | ICD-10-CM | POA: Diagnosis present

## 2023-11-05 DIAGNOSIS — N1832 Chronic kidney disease, stage 3b: Secondary | ICD-10-CM | POA: Diagnosis present

## 2023-11-05 DIAGNOSIS — I10 Essential (primary) hypertension: Secondary | ICD-10-CM | POA: Diagnosis not present

## 2023-11-05 DIAGNOSIS — Z888 Allergy status to other drugs, medicaments and biological substances status: Secondary | ICD-10-CM

## 2023-11-05 DIAGNOSIS — Z7982 Long term (current) use of aspirin: Secondary | ICD-10-CM | POA: Diagnosis not present

## 2023-11-05 DIAGNOSIS — H5121 Internuclear ophthalmoplegia, right eye: Secondary | ICD-10-CM | POA: Diagnosis not present

## 2023-11-05 DIAGNOSIS — E785 Hyperlipidemia, unspecified: Secondary | ICD-10-CM | POA: Diagnosis not present

## 2023-11-05 DIAGNOSIS — I739 Peripheral vascular disease, unspecified: Secondary | ICD-10-CM | POA: Diagnosis not present

## 2023-11-05 DIAGNOSIS — I6389 Other cerebral infarction: Secondary | ICD-10-CM | POA: Diagnosis not present

## 2023-11-05 LAB — I-STAT CHEM 8, ED
BUN: 29 mg/dL — ABNORMAL HIGH (ref 8–23)
Calcium, Ion: 1.2 mmol/L (ref 1.15–1.40)
Chloride: 103 mmol/L (ref 98–111)
Creatinine, Ser: 2.2 mg/dL — ABNORMAL HIGH (ref 0.61–1.24)
Glucose, Bld: 222 mg/dL — ABNORMAL HIGH (ref 70–99)
HCT: 37 % — ABNORMAL LOW (ref 39.0–52.0)
Hemoglobin: 12.6 g/dL — ABNORMAL LOW (ref 13.0–17.0)
Potassium: 3.8 mmol/L (ref 3.5–5.1)
Sodium: 141 mmol/L (ref 135–145)
TCO2: 27 mmol/L (ref 22–32)

## 2023-11-05 LAB — DIFFERENTIAL
Abs Immature Granulocytes: 0.02 10*3/uL (ref 0.00–0.07)
Basophils Absolute: 0 10*3/uL (ref 0.0–0.1)
Basophils Relative: 0 %
Eosinophils Absolute: 0.7 10*3/uL — ABNORMAL HIGH (ref 0.0–0.5)
Eosinophils Relative: 10 %
Immature Granulocytes: 0 %
Lymphocytes Relative: 14 %
Lymphs Abs: 1 10*3/uL (ref 0.7–4.0)
Monocytes Absolute: 0.8 10*3/uL (ref 0.1–1.0)
Monocytes Relative: 10 %
Neutro Abs: 4.9 10*3/uL (ref 1.7–7.7)
Neutrophils Relative %: 66 %

## 2023-11-05 LAB — CBC
HCT: 35.6 % — ABNORMAL LOW (ref 39.0–52.0)
Hemoglobin: 11.1 g/dL — ABNORMAL LOW (ref 13.0–17.0)
MCH: 28.8 pg (ref 26.0–34.0)
MCHC: 31.2 g/dL (ref 30.0–36.0)
MCV: 92.2 fL (ref 80.0–100.0)
Platelets: 243 10*3/uL (ref 150–400)
RBC: 3.86 MIL/uL — ABNORMAL LOW (ref 4.22–5.81)
RDW: 13.8 % (ref 11.5–15.5)
WBC: 7.5 10*3/uL (ref 4.0–10.5)
nRBC: 0 % (ref 0.0–0.2)

## 2023-11-05 LAB — ETHANOL: Alcohol, Ethyl (B): <10 mg/dL (ref <10)

## 2023-11-05 LAB — PROTIME-INR
INR: 1 (ref 0.8–1.2)
Prothrombin Time: 13.5 s (ref 11.4–15.2)

## 2023-11-05 LAB — CBG MONITORING, ED: Glucose-Capillary: 256 mg/dL — ABNORMAL HIGH (ref 70–99)

## 2023-11-05 LAB — APTT: aPTT: 28 s (ref 24–36)

## 2023-11-05 NOTE — ED Triage Notes (Signed)
 PT was BIB his son with a hx of dementia and prior CVAs. Pt's son left to move vehicle and there is a delay in receiving the HPI. Pt's son called and provided HPI via phone at this time at 19:00 this evening he noted the pt seemed to have L side weakness and was falling to the L while using a cane. He also noted pt's R eye does not track the same as the L eye. Pt was LKW at 1500 today when he went shopping with another family member. In triage pt is oriented only to self. L eye does not cross midline. No unilateral upper or lower extremity weakness present.

## 2023-11-05 NOTE — ED Notes (Signed)
 Pt's son updated the LKW as 1700.

## 2023-11-06 ENCOUNTER — Encounter (HOSPITAL_COMMUNITY): Payer: Self-pay | Admitting: Internal Medicine

## 2023-11-06 ENCOUNTER — Emergency Department (HOSPITAL_COMMUNITY)

## 2023-11-06 ENCOUNTER — Inpatient Hospital Stay (HOSPITAL_COMMUNITY)

## 2023-11-06 DIAGNOSIS — I739 Peripheral vascular disease, unspecified: Secondary | ICD-10-CM | POA: Diagnosis not present

## 2023-11-06 DIAGNOSIS — I6389 Other cerebral infarction: Secondary | ICD-10-CM

## 2023-11-06 DIAGNOSIS — E119 Type 2 diabetes mellitus without complications: Secondary | ICD-10-CM | POA: Diagnosis not present

## 2023-11-06 DIAGNOSIS — Z87891 Personal history of nicotine dependence: Secondary | ICD-10-CM | POA: Diagnosis not present

## 2023-11-06 DIAGNOSIS — E785 Hyperlipidemia, unspecified: Secondary | ICD-10-CM | POA: Diagnosis present

## 2023-11-06 DIAGNOSIS — Z79899 Other long term (current) drug therapy: Secondary | ICD-10-CM | POA: Diagnosis not present

## 2023-11-06 DIAGNOSIS — I69328 Other speech and language deficits following cerebral infarction: Secondary | ICD-10-CM | POA: Diagnosis not present

## 2023-11-06 DIAGNOSIS — Z8249 Family history of ischemic heart disease and other diseases of the circulatory system: Secondary | ICD-10-CM | POA: Diagnosis not present

## 2023-11-06 DIAGNOSIS — H499 Unspecified paralytic strabismus: Secondary | ICD-10-CM | POA: Diagnosis present

## 2023-11-06 DIAGNOSIS — I129 Hypertensive chronic kidney disease with stage 1 through stage 4 chronic kidney disease, or unspecified chronic kidney disease: Secondary | ICD-10-CM | POA: Diagnosis present

## 2023-11-06 DIAGNOSIS — N1832 Chronic kidney disease, stage 3b: Secondary | ICD-10-CM | POA: Diagnosis present

## 2023-11-06 DIAGNOSIS — Z79890 Hormone replacement therapy (postmenopausal): Secondary | ICD-10-CM | POA: Diagnosis not present

## 2023-11-06 DIAGNOSIS — N179 Acute kidney failure, unspecified: Secondary | ICD-10-CM | POA: Diagnosis present

## 2023-11-06 DIAGNOSIS — I639 Cerebral infarction, unspecified: Secondary | ICD-10-CM | POA: Diagnosis present

## 2023-11-06 DIAGNOSIS — H532 Diplopia: Secondary | ICD-10-CM | POA: Diagnosis present

## 2023-11-06 DIAGNOSIS — R29702 NIHSS score 2: Secondary | ICD-10-CM | POA: Diagnosis present

## 2023-11-06 DIAGNOSIS — I6381 Other cerebral infarction due to occlusion or stenosis of small artery: Secondary | ICD-10-CM | POA: Diagnosis present

## 2023-11-06 DIAGNOSIS — I69322 Dysarthria following cerebral infarction: Secondary | ICD-10-CM | POA: Diagnosis not present

## 2023-11-06 DIAGNOSIS — I10 Essential (primary) hypertension: Secondary | ICD-10-CM | POA: Diagnosis not present

## 2023-11-06 DIAGNOSIS — H5121 Internuclear ophthalmoplegia, right eye: Secondary | ICD-10-CM

## 2023-11-06 DIAGNOSIS — Z7902 Long term (current) use of antithrombotics/antiplatelets: Secondary | ICD-10-CM | POA: Diagnosis not present

## 2023-11-06 DIAGNOSIS — Z833 Family history of diabetes mellitus: Secondary | ICD-10-CM | POA: Diagnosis not present

## 2023-11-06 DIAGNOSIS — Z823 Family history of stroke: Secondary | ICD-10-CM | POA: Diagnosis not present

## 2023-11-06 DIAGNOSIS — Z7984 Long term (current) use of oral hypoglycemic drugs: Secondary | ICD-10-CM | POA: Diagnosis not present

## 2023-11-06 DIAGNOSIS — Z794 Long term (current) use of insulin: Secondary | ICD-10-CM | POA: Diagnosis not present

## 2023-11-06 DIAGNOSIS — E1165 Type 2 diabetes mellitus with hyperglycemia: Secondary | ICD-10-CM | POA: Diagnosis present

## 2023-11-06 DIAGNOSIS — Z7982 Long term (current) use of aspirin: Secondary | ICD-10-CM | POA: Diagnosis not present

## 2023-11-06 DIAGNOSIS — F01A0 Vascular dementia, mild, without behavioral disturbance, psychotic disturbance, mood disturbance, and anxiety: Secondary | ICD-10-CM

## 2023-11-06 DIAGNOSIS — E1122 Type 2 diabetes mellitus with diabetic chronic kidney disease: Secondary | ICD-10-CM | POA: Diagnosis present

## 2023-11-06 DIAGNOSIS — E1100 Type 2 diabetes mellitus with hyperosmolarity without nonketotic hyperglycemic-hyperosmolar coma (NKHHC): Secondary | ICD-10-CM | POA: Diagnosis not present

## 2023-11-06 DIAGNOSIS — E7800 Pure hypercholesterolemia, unspecified: Secondary | ICD-10-CM

## 2023-11-06 DIAGNOSIS — E039 Hypothyroidism, unspecified: Secondary | ICD-10-CM | POA: Diagnosis present

## 2023-11-06 DIAGNOSIS — F03A3 Unspecified dementia, mild, with mood disturbance: Secondary | ICD-10-CM | POA: Diagnosis present

## 2023-11-06 LAB — CBC
HCT: 35 % — ABNORMAL LOW (ref 39.0–52.0)
Hemoglobin: 11.1 g/dL — ABNORMAL LOW (ref 13.0–17.0)
MCH: 28.8 pg (ref 26.0–34.0)
MCHC: 31.7 g/dL (ref 30.0–36.0)
MCV: 90.9 fL (ref 80.0–100.0)
Platelets: 228 10*3/uL (ref 150–400)
RBC: 3.85 MIL/uL — ABNORMAL LOW (ref 4.22–5.81)
RDW: 13.6 % (ref 11.5–15.5)
WBC: 8.4 10*3/uL (ref 4.0–10.5)
nRBC: 0 % (ref 0.0–0.2)

## 2023-11-06 LAB — LIPID PANEL
Cholesterol: 108 mg/dL (ref 0–200)
HDL: 40 mg/dL — ABNORMAL LOW (ref >40)
LDL Cholesterol: 53 mg/dL (ref 0–99)
Total CHOL/HDL Ratio: 2.7 ratio
Triglycerides: 76 mg/dL (ref <150)
VLDL: 15 mg/dL (ref 0–40)

## 2023-11-06 LAB — PHOSPHORUS: Phosphorus: 3.3 mg/dL (ref 2.5–4.6)

## 2023-11-06 LAB — ECHOCARDIOGRAM COMPLETE
Area-P 1/2: 3.68 cm2
Height: 69 in
S' Lateral: 3.2 cm
Weight: 3200 [oz_av]

## 2023-11-06 LAB — COMPREHENSIVE METABOLIC PANEL WITH GFR
ALT: 18 U/L (ref 0–44)
AST: 21 U/L (ref 15–41)
Albumin: 3.5 g/dL (ref 3.5–5.0)
Alkaline Phosphatase: 118 U/L (ref 38–126)
Anion gap: 9 (ref 5–15)
BUN: 25 mg/dL — ABNORMAL HIGH (ref 8–23)
CO2: 25 mmol/L (ref 22–32)
Calcium: 9 mg/dL (ref 8.9–10.3)
Chloride: 104 mmol/L (ref 98–111)
Creatinine, Ser: 2.05 mg/dL — ABNORMAL HIGH (ref 0.61–1.24)
GFR, Estimated: 31 mL/min — ABNORMAL LOW (ref >60)
Glucose, Bld: 246 mg/dL — ABNORMAL HIGH (ref 70–99)
Potassium: 3.9 mmol/L (ref 3.5–5.1)
Sodium: 138 mmol/L (ref 135–145)
Total Bilirubin: 0.3 mg/dL (ref 0.0–1.2)
Total Protein: 6.4 g/dL — ABNORMAL LOW (ref 6.5–8.1)

## 2023-11-06 LAB — BASIC METABOLIC PANEL WITH GFR
Anion gap: 12 (ref 5–15)
BUN: 20 mg/dL (ref 8–23)
CO2: 23 mmol/L (ref 22–32)
Calcium: 8.9 mg/dL (ref 8.9–10.3)
Chloride: 104 mmol/L (ref 98–111)
Creatinine, Ser: 1.34 mg/dL — ABNORMAL HIGH (ref 0.61–1.24)
GFR, Estimated: 52 mL/min — ABNORMAL LOW (ref >60)
Glucose, Bld: 225 mg/dL — ABNORMAL HIGH (ref 70–99)
Potassium: 3.4 mmol/L — ABNORMAL LOW (ref 3.5–5.1)
Sodium: 139 mmol/L (ref 135–145)

## 2023-11-06 LAB — MAGNESIUM: Magnesium: 1.5 mg/dL — ABNORMAL LOW (ref 1.7–2.4)

## 2023-11-06 LAB — GLUCOSE, CAPILLARY
Glucose-Capillary: 163 mg/dL — ABNORMAL HIGH (ref 70–99)
Glucose-Capillary: 127 mg/dL — ABNORMAL HIGH (ref 70–99)
Glucose-Capillary: 127 mg/dL — ABNORMAL HIGH (ref 70–99)

## 2023-11-06 LAB — HEMOGLOBIN A1C
Hgb A1c MFr Bld: 8.8 % — ABNORMAL HIGH (ref 4.8–5.6)
Mean Plasma Glucose: 205.86 mg/dL

## 2023-11-06 MED ORDER — SODIUM CHLORIDE 0.9 % IV SOLN: INTRAVENOUS | Status: AC

## 2023-11-06 MED ORDER — INSULIN ASPART 100 UNIT/ML IJ SOLN: 0.0000 [IU] | Freq: Every day | INTRAMUSCULAR | Status: DC

## 2023-11-06 MED ORDER — SODIUM CHLORIDE 0.9% FLUSH
3.0000 mL | Freq: Two times a day (BID) | INTRAVENOUS | Status: DC
  Administered 2023-11-06 – 2023-11-07 (×3): 3 mL via INTRAVENOUS

## 2023-11-06 MED ORDER — CLOPIDOGREL BISULFATE 75 MG PO TABS
75.0000 mg | ORAL_TABLET | Freq: Every day | ORAL | Status: DC
  Administered 2023-11-06 – 2023-11-07 (×2): 75 mg via ORAL
  Filled 2023-11-06 (×2): qty 1

## 2023-11-06 MED ORDER — LEVOTHYROXINE SODIUM 50 MCG PO TABS
50.0000 ug | ORAL_TABLET | Freq: Every day | ORAL | Status: DC
  Administered 2023-11-06 – 2023-11-07 (×2): 50 ug via ORAL
  Filled 2023-11-06: qty 2
  Filled 2023-11-06: qty 1

## 2023-11-06 MED ORDER — ENOXAPARIN SODIUM 30 MG/0.3ML IJ SOSY: 30.0000 mg | PREFILLED_SYRINGE | INTRAMUSCULAR | Status: DC

## 2023-11-06 MED ORDER — DIAZEPAM 5 MG/ML IJ SOLN: 2.5000 mg | Freq: Once | INTRAMUSCULAR | Status: DC

## 2023-11-06 MED ORDER — DONEPEZIL HCL 10 MG PO TABS
10.0000 mg | ORAL_TABLET | Freq: Every day | ORAL | Status: DC
  Administered 2023-11-06: 10 mg via ORAL
  Filled 2023-11-06: qty 1

## 2023-11-06 MED ORDER — ATORVASTATIN CALCIUM 80 MG PO TABS
80.0000 mg | ORAL_TABLET | Freq: Every day | ORAL | Status: DC
Start: 2023-11-06 — End: 2023-11-07
  Administered 2023-11-06 – 2023-11-07 (×2): 80 mg via ORAL
  Filled 2023-11-06: qty 1
  Filled 2023-11-06: qty 2

## 2023-11-06 MED ORDER — CITALOPRAM HYDROBROMIDE 10 MG PO TABS
20.0000 mg | ORAL_TABLET | Freq: Every day | ORAL | Status: DC
  Administered 2023-11-06 – 2023-11-07 (×2): 20 mg via ORAL
  Filled 2023-11-06 (×2): qty 2

## 2023-11-06 MED ORDER — STROKE: EARLY STAGES OF RECOVERY BOOK
Freq: Once | Status: AC
  Filled 2023-11-06: qty 1

## 2023-11-06 MED ORDER — ASPIRIN 81 MG PO TBEC
81.0000 mg | DELAYED_RELEASE_TABLET | Freq: Every day | ORAL | Status: DC
  Administered 2023-11-06 – 2023-11-07 (×2): 81 mg via ORAL
  Filled 2023-11-06 (×2): qty 1

## 2023-11-06 MED ORDER — SODIUM CHLORIDE 0.9 % IV BOLUS
1000.0000 mL | Freq: Once | INTRAVENOUS | Status: AC
  Administered 2023-11-06: 1000 mL via INTRAVENOUS

## 2023-11-06 MED ORDER — ENOXAPARIN SODIUM 40 MG/0.4ML IJ SOSY
40.0000 mg | PREFILLED_SYRINGE | INTRAMUSCULAR | Status: DC
  Administered 2023-11-06: 40 mg via SUBCUTANEOUS
  Filled 2023-11-06: qty 0.4

## 2023-11-06 MED ORDER — INSULIN ASPART 100 UNIT/ML IJ SOLN
0.0000 [IU] | Freq: Three times a day (TID) | INTRAMUSCULAR | Status: DC
  Administered 2023-11-06: 1 [IU] via SUBCUTANEOUS
  Administered 2023-11-06: 2 [IU] via SUBCUTANEOUS

## 2023-11-06 MED ORDER — MELATONIN 3 MG PO TABS
6.0000 mg | ORAL_TABLET | Freq: Every day | ORAL | Status: DC
  Administered 2023-11-06: 6 mg via ORAL
  Filled 2023-11-06: qty 2

## 2023-11-06 MED ORDER — MEMANTINE HCL 10 MG PO TABS
10.0000 mg | ORAL_TABLET | Freq: Two times a day (BID) | ORAL | Status: DC
  Administered 2023-11-06 – 2023-11-07 (×3): 10 mg via ORAL
  Filled 2023-11-06 (×2): qty 1

## 2023-11-06 NOTE — ED Notes (Signed)
 Patient transported to Ultrasound

## 2023-11-06 NOTE — Progress Notes (Signed)
 [facility]                                                                              Scott Vasquez, is a 83 y.o. male, DOB - 1940-09-10, BJY:[id number] Admit date - 11/05/2023    Outpatient Primary MD for the patient is Nafziger, Kandee Keen, NP  LOS - 0  days  Chief Complaint  Patient presents with   Cerebrovascular Accident       Brief summary   Patient is a 83 year old male with dementia, diabetes mellitus type 2, prior CVA with residual slurred speech, hypertension, hyperlipidemia presented with visual disturbance.  Patient's son reported that patient was at his baseline when he saw him around 5 PM on the day of admission.  Subsequently 2 hours later patient appeared to have difficulty with his vision, abnormal movements in left eye and seemed off balance.   Patient presented to ED CT head showed no evidence of acute intracranial abnormality, chronic microvascular ischemic disease MRI brain showed small acute infarct in the right paramidline midbrain, many chronic micro hemorrhages, likely due to chronic hypertension, moderate chronic microvascular ischemic disease.  Neurology consulted, patient was admitted for further workup.  Assessment & Plan    Principal Problem:   Acute ischemic stroke (HCC) with diplopia, right INO, gait instability - MRI brain showed small acute infarct in the right paramidline midbrain, many chronic microhemorrhages,  moderate chronic microvascular ischemic disease. -Neurology consulted, suspected likely small vessel disease due to poorly controlled stroke risk factors including diabetes, hyperlipidemia. -MRA showed no emergent large vessel occlusion, advanced right P2 PCA stenosis -Obtain 2D echo, obtain lipid panel, hemoglobin A1c, carotid Dopplers -PT OT evaluation -Started on aspirin, Plavix, Lipitor  Active Problems: Acute kidney injury on CKD (chronic kidney disease), stage IIIb -Baseline creatinine 1.4-1.7 -Presented with  creatinine of 2.0, increased to 2 2 today Placed on gentle hydration, avoid hypotension    Hyperlipidemia -Await lipid panel, continue Lipitor     Essential hypertension -Patient on amlodipine, 5 mg daily, hold due to acute CVA  Hypothyroidism -Continue Synthroid     DM2 (diabetes mellitus, type 2) (HCC) on control with hyperglycemia, NIDDM with CKD -On glipizide outpatient -Obtain hemoglobin A1c, placed on sliding scale insulin while inpatient     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   Weight as of this encounter: 90.7 kg.  Code Status: Full code DVT Prophylaxis:  enoxaparin (LOVENOX) injection 30 mg Start: 11/06/23 1600   Level of Care: Level of care: Telemetry Medical Family Communication: Updated patient's son at the bedside Disposition Plan:      Remains inpatient appropriate:      Procedures:    Consultants:   Neurology  Antimicrobials:   Anti-infectives (From admission, onward)    None          Medications  [START ON 11/07/2023]  stroke: early stages of recovery book   Does not apply Once   aspirin EC  81 mg Oral Daily   atorvastatin  80 mg Oral Daily   citalopram  20 mg Oral Daily   clopidogrel  75 mg Oral Daily   donepezil  10 mg Oral QHS   enoxaparin (LOVENOX) injection  30 mg Subcutaneous Q24H   levothyroxine  50 mcg Oral Q0600   melatonin  6 mg Oral QHS   memantine  10 mg Oral BID   sodium chloride flush  3 mL Intravenous Q12H      Subjective:   Scott Vasquez was seen and examined today.  Has residual slurred speech from previous stroke, son at the bedside. Patient denies dizziness, chest pain, shortness of breath, abdominal pain, N/V/D/C.   Objective:   Vitals:   11/06/23 0219 11/06/23 0400 11/06/23 0515 11/06/23 0654  BP: 116/65 130/69 (!) 109/54   Pulse: 61 84 65    Resp: 16 18 19    Temp: 98.7 F (37.1 C)   97.8 F (36.6 C)  TempSrc: Oral     SpO2: 99% 100% 100%   Weight:      Height:       No intake or output data in the 24 hours ending 11/06/23 0856   Wt Readings from Last 3 Encounters:  11/05/23 90.7 kg  10/01/23 81.3 kg  07/15/23 83.1 kg     Exam General: Alert and oriented x 2, self and place NAD HEENT: right eye INO Cardiovascular: S1 S2 auscultated,  RRR Respiratory: Clear to auscultation bilaterally, no wheezing, rales or rhonchi Gastrointestinal: Soft, nontender, nondistended, + bowel sounds Ext: no pedal edema bilaterally Neuro: Strength 5/5 upper and lower extremities bilaterally, right eye INO, dysarthria Psych: Normal affect     Data Reviewed:  I have personally reviewed following labs    CBC Lab Results  Component Value Date   WBC 7.5 11/05/2023   RBC 3.86 (L) 11/05/2023   HGB 12.6 (L) 11/05/2023   HCT 37.0 (L) 11/05/2023   MCV 92.2 11/05/2023   MCH 28.8 11/05/2023   PLT 243 11/05/2023   MCHC 31.2 11/05/2023   RDW 13.8 11/05/2023   LYMPHSABS 1.0 11/05/2023   MONOABS 0.8 11/05/2023   EOSABS 0.7 (H) 11/05/2023   BASOSABS 0.0 11/05/2023     Last metabolic panel Lab Results  Component Value Date   NA 141 11/05/2023   K 3.8 11/05/2023   CL 103 11/05/2023   CO2 25 11/05/2023   BUN 29 (H) 11/05/2023   CREATININE 2.20 (H) 11/05/2023   GLUCOSE 222 (H) 11/05/2023   GFRNONAA 31 (L) 11/05/2023   GFRAA 49 (L) 07/17/2020   CALCIUM 9.0 11/05/2023   PROT 6.4 (L) 11/05/2023   ALBUMIN 3.5 11/05/2023   LABGLOB 2.7 10/01/2023   BILITOT 0.3 11/05/2023   ALKPHOS 118 11/05/2023   AST 21 11/05/2023   ALT 18 11/05/2023   ANIONGAP 9 11/05/2023    CBG (last 3)  Recent Labs    11/05/23 2246  GLUCAP 256*      Coagulation Profile: Recent Labs  Lab 11/05/23 2329  INR 1.0     Radiology Studies: I have personally reviewed the imaging studies  MR BRAIN WO CONTRAST Result Date: 11/06/2023 CLINICAL DATA:   Neuro deficit, acute, stroke suspected EXAM: MRI HEAD WITHOUT CONTRAST MRA HEAD WITHOUT CONTRAST TECHNIQUE: Multiplanar, multi-echo pulse sequences of the brain and surrounding structures were acquired without intravenous contrast. Angiographic images of the Circle of Willis were acquired using MRA technique without intravenous contrast. COMPARISON:  None Available. FINDINGS: MRI HEAD FINDINGS Brain: Small acute infarct in the right paramidline midbrain. Slight  edema without mass effect. No evidence of acute hemorrhage. Many foci of susceptibility artifact predominantly in the brainstem, basal ganglia and thalami, compatible with chronic microhemorrhages. Moderate T2/FLAIR hyperintensities the white matter, compatible with chronic microvascular ischemic disease. Remote lacunar infarcts in the corona radiata bilaterally. No mass lesion, midline shift or hydrocephalus Vascular: See below. Skull and upper cervical spine: Normal marrow signal. Sinuses/Orbits: No acute or significant finding. Other: No mastoid effusions. MRA HEAD FINDINGS Anterior circulation: Bilateral intracranial ICAs, MCAs, and ACAs are patent without proximal hemodynamically significant stenosis. Azygos ACA, anatomic variant. Posterior circulation: Bilateral intradural vertebral arteries, basilar artery and bilateral posterior cerebral arteries are patent. Advanced right P2 PCA stenosis. Anatomic variants: Detailed above. IMPRESSION: MRI: 1. Small acute infarct in the right paramidline midbrain. 2. Many chronic microhemorrhages, likely due to chronic hypertension. 3. Moderate chronic microvascular ischemic disease. MRA: 1. No emergent large vessel occlusion. 2. Advanced right P2 PCA stenosis. Electronically Signed   By: Feliberto Harts M.D.   On: 11/06/2023 02:31   MR ANGIO HEAD WO CONTRAST Result Date: 11/06/2023 CLINICAL DATA:  Neuro deficit, acute, stroke suspected EXAM: MRI HEAD WITHOUT CONTRAST MRA HEAD WITHOUT CONTRAST TECHNIQUE:  Multiplanar, multi-echo pulse sequences of the brain and surrounding structures were acquired without intravenous contrast. Angiographic images of the Circle of Willis were acquired using MRA technique without intravenous contrast. COMPARISON:  None Available. FINDINGS: MRI HEAD FINDINGS Brain: Small acute infarct in the right paramidline midbrain. Slight edema without mass effect. No evidence of acute hemorrhage. Many foci of susceptibility artifact predominantly in the brainstem, basal ganglia and thalami, compatible with chronic microhemorrhages. Moderate T2/FLAIR hyperintensities the white matter, compatible with chronic microvascular ischemic disease. Remote lacunar infarcts in the corona radiata bilaterally. No mass lesion, midline shift or hydrocephalus Vascular: See below. Skull and upper cervical spine: Normal marrow signal. Sinuses/Orbits: No acute or significant finding. Other: No mastoid effusions. MRA HEAD FINDINGS Anterior circulation: Bilateral intracranial ICAs, MCAs, and ACAs are patent without proximal hemodynamically significant stenosis. Azygos ACA, anatomic variant. Posterior circulation: Bilateral intradural vertebral arteries, basilar artery and bilateral posterior cerebral arteries are patent. Advanced right P2 PCA stenosis. Anatomic variants: Detailed above. IMPRESSION: MRI: 1. Small acute infarct in the right paramidline midbrain. 2. Many chronic microhemorrhages, likely due to chronic hypertension. 3. Moderate chronic microvascular ischemic disease. MRA: 1. No emergent large vessel occlusion. 2. Advanced right P2 PCA stenosis. Electronically Signed   By: Feliberto Harts M.D.   On: 11/06/2023 02:31   CT HEAD WO CONTRAST Result Date: 11/06/2023 CLINICAL DATA:  Ophthalmoplegia EXAM: CT HEAD WITHOUT CONTRAST TECHNIQUE: Contiguous axial images were obtained from the base of the skull through the vertex without intravenous contrast. RADIATION DOSE REDUCTION: This exam was performed  according to the departmental dose-optimization program which includes automated exposure control, adjustment of the mA and/or kV according to patient size and/or use of iterative reconstruction technique. COMPARISON:  CT head March 14, 2023 FINDINGS: Brain: No evidence of acute infarction, hemorrhage, hydrocephalus, extra-axial collection or mass lesion/mass effect. Similar patchy white matter hypodensities, compatible with chronic microvascular ischemic disease. Similar remote lacunar infarcts involving the thalami and potentially basal ganglia. Vascular: Calcific atherosclerosis. No hyperdense vessel identified. Skull: No acute fracture. Sinuses/Orbits: Clear sinuses.  No acute orbital findings. Other: No mastoid effusions. IMPRESSION: 1. No evidence of acute intracranial abnormality.  Stable head CT. 2. Chronic microvascular ischemic disease. Electronically Signed   By: Feliberto Harts M.D.   On: 11/06/2023 00:37       Kristyanna Barcelo  M.D. [facility] 11/06/2023, 8:56 AM  Available via Epic secure chat 7am-7pm After 7 pm, please refer to night coverage provider listed on amion.

## 2023-11-06 NOTE — Progress Notes (Signed)
 STROKE TEAM PROGRESS NOTE   SUBJECTIVE (INTERVAL HISTORY) His son is at the bedside.  Patient lying bed, still has right INO, and chronic dysarthria.  Neuro stable pending further stroke workup.   OBJECTIVE Temp:  [97.8 F (36.6 C)-98.7 F (37.1 C)] 98 F (36.7 C) (03/29 1011) Pulse Rate:  [61-84] 78 (03/29 1049) Cardiac Rhythm: Normal sinus rhythm;Heart block (03/29 1311) Resp:  [15-19] 19 (03/29 1011) BP: (103-130)/(54-70) 126/70 (03/29 1049) SpO2:  [91 %-100 %] 98 % (03/29 1049) Weight:  [90.7 kg] 90.7 kg (03/28 2258)  Recent Labs  Lab 11/05/23 2246 11/06/23 1350  GLUCAP 256* 163*   Recent Labs  Lab 11/05/23 2329 11/05/23 2334 11/06/23 1247  NA 138 141 139  K 3.9 3.8 3.4*  CL 104 103 104  CO2 25  --  23  GLUCOSE 246* 222* 225*  BUN 25* 29* 20  CREATININE 2.05* 2.20* 1.34*  CALCIUM 9.0  --  8.9  MG  --   --  1.5*  PHOS  --   --  3.3   Recent Labs  Lab 11/05/23 2329  AST 21  ALT 18  ALKPHOS 118  BILITOT 0.3  PROT 6.4*  ALBUMIN 3.5   Recent Labs  Lab 11/05/23 2329 11/05/23 2334 11/06/23 1247  WBC 7.5  --  8.4  NEUTROABS 4.9  --   --   HGB 11.1* 12.6* 11.1*  HCT 35.6* 37.0* 35.0*  MCV 92.2  --  90.9  PLT 243  --  228   No results for input(s): "CKTOTAL", "CKMB", "CKMBINDEX", "TROPONINI" in the last 168 hours. Recent Labs    11/05/23 2329  LABPROT 13.5  INR 1.0   No results for input(s): "COLORURINE", "LABSPEC", "PHURINE", "GLUCOSEU", "HGBUR", "BILIRUBINUR", "KETONESUR", "PROTEINUR", "UROBILINOGEN", "NITRITE", "LEUKOCYTESUR" in the last 72 hours.  Invalid input(s): "APPERANCEUR"     Component Value Date/Time   CHOL 108 11/06/2023 1247   TRIG 76 11/06/2023 1247   TRIG 31 07/16/2006 1037   HDL 40 (L) 11/06/2023 1247   CHOLHDL 2.7 11/06/2023 1247   VLDL 15 11/06/2023 1247   LDLCALC 53 11/06/2023 1247   LDLCALC 74 07/17/2020 1025   Lab Results  Component Value Date   HGBA1C 8.8 (H) 11/06/2023      Component Value Date/Time   LABOPIA  NONE DETECTED 10/20/2014 0220   COCAINSCRNUR NONE DETECTED 10/20/2014 0220   LABBENZ NONE DETECTED 10/20/2014 0220   AMPHETMU NONE DETECTED 10/20/2014 0220   THCU NONE DETECTED 10/20/2014 0220   LABBARB NONE DETECTED 10/20/2014 0220    Recent Labs  Lab 11/05/23 2329  ETH <10    I have personally reviewed the radiological images below and agree with the radiology interpretations.  ECHOCARDIOGRAM COMPLETE Result Date: 11/06/2023    ECHOCARDIOGRAM REPORT   Patient Name:   KEYLOR RANDS Linskey Date of Exam: 11/06/2023 Medical Rec #:  [id number]            Height:       69.0 in Accession #:    [id number]           Weight:       200.0 lb Date of Birth:  1940-08-25            BSA:          2.066 m Patient Age:    83 years             BP:           109/54 mmHg Patient Gender: M  HR:           61 bpm. Exam Location:  Inpatient Procedure: 2D Echo, Cardiac Doppler, Color Doppler and Saline Contrast Bubble            Study (Both Spectral and Color Flow Doppler were utilized during            procedure). Indications:    Stroke I63.9  History:        Patient has prior history of Echocardiogram examinations, most                 recent 03/15/2023.  Sonographer:    Harriette Bouillon RDCS Referring Phys: [id number] Dolly Rias IMPRESSIONS  1. The saline contrast study is poor quality. Please repeat or order TEE if there is a high level of suspicion for paradoxical embolism.  2. Left ventricular ejection fraction, by estimation, is 60 to 65%. The left ventricle has normal function. The left ventricle has no regional wall motion abnormalities. Left ventricular diastolic parameters are consistent with Grade I diastolic dysfunction (impaired relaxation).  3. Right ventricular systolic function is normal. The right ventricular size is normal.  4. The mitral valve is normal in structure. No evidence of mitral valve regurgitation. No evidence of mitral stenosis. Moderate mitral annular calcification.  5. The  aortic valve is tricuspid. There is mild calcification of the aortic valve. Aortic valve regurgitation is trivial. Aortic valve sclerosis/calcification is present, without any evidence of aortic stenosis.  6. The inferior vena cava is normal in size with greater than 50% respiratory variability, suggesting right atrial pressure of 3 mmHg. FINDINGS  Left Ventricle: Left ventricular ejection fraction, by estimation, is 60 to 65%. The left ventricle has normal function. The left ventricle has no regional wall motion abnormalities. The left ventricular internal cavity size was normal in size. There is  no left ventricular hypertrophy. Left ventricular diastolic parameters are consistent with Grade I diastolic dysfunction (impaired relaxation). Normal left ventricular filling pressure. Right Ventricle: The right ventricular size is normal. No increase in right ventricular wall thickness. Right ventricular systolic function is normal. Left Atrium: Left atrial size was normal in size. Right Atrium: Right atrial size was normal in size. Prominent Eustachian valve. Pericardium: There is no evidence of pericardial effusion. Mitral Valve: The mitral valve is normal in structure. Moderate mitral annular calcification. No evidence of mitral valve regurgitation. No evidence of mitral valve stenosis. Tricuspid Valve: The tricuspid valve is normal in structure. Tricuspid valve regurgitation is not demonstrated. No evidence of tricuspid stenosis. Aortic Valve: The aortic valve is tricuspid. There is mild calcification of the aortic valve. Aortic valve regurgitation is trivial. Aortic valve sclerosis/calcification is present, without any evidence of aortic stenosis. Pulmonic Valve: The pulmonic valve was normal in structure. Pulmonic valve regurgitation is not visualized. No evidence of pulmonic stenosis. Aorta: The aortic root is normal in size and structure. Venous: The inferior vena cava is normal in size with greater than 50%  respiratory variability, suggesting right atrial pressure of 3 mmHg. IAS/Shunts: The interatrial septum was not well visualized. Agitated saline contrast was given intravenously to evaluate for intracardiac shunting.  LEFT VENTRICLE PLAX 2D LVIDd:         4.50 cm   Diastology LVIDs:         3.20 cm   LV e' medial:    7.62 cm/s LV PW:         0.90 cm   LV E/e' medial:  10.8 LV IVS:  0.80 cm   LV e' lateral:   8.92 cm/s LVOT diam:     2.00 cm   LV E/e' lateral: 9.2 LV SV:         65 LV SV Index:   31 LVOT Area:     3.14 cm  RIGHT VENTRICLE RV S prime:     8.49 cm/s TAPSE (M-mode): 2.2 cm LEFT ATRIUM             Index        RIGHT ATRIUM           Index LA diam:        3.70 cm 1.79 cm/m   RA Area:     16.60 cm LA Vol (A2C):   39.3 ml 19.02 ml/m  RA Volume:   41.80 ml  20.23 ml/m LA Vol (A4C):   35.4 ml 17.13 ml/m LA Biplane Vol: 37.9 ml 18.34 ml/m  AORTIC VALVE LVOT Vmax:   83.00 cm/s LVOT Vmean:  57.200 cm/s LVOT VTI:    0.207 m  AORTA Ao Root diam: 3.20 cm MITRAL VALVE MV Area (PHT): 3.68 cm     SHUNTS MV Decel Time: 206 msec     Systemic VTI:  0.21 m MV E velocity: 82.30 cm/s   Systemic Diam: 2.00 cm MV A velocity: 114.00 cm/s MV E/A ratio:  0.72 Mihai Croitoru MD Electronically signed by Thurmon Fair MD Signature Date/Time: 11/06/2023/1:03:06 PM    Final    VAS US CAROTID Result Date: 11/06/2023 Carotid Arterial Duplex Study Patient Name:  KAZI REPPOND Grammatico  Date of Exam:   11/06/2023 Medical Rec #: [id number]             Accession #:    [id number] Date of Birth: [birth year]             Patient Gender: M Patient Age:   32 years Exam [facility]  [facility] Procedure:      VAS US CAROTID Referring Phys: Scheryl Marten September Mormile --------------------------------------------------------------------------------  Indications:       CVA and Abnormal eye movements and gait disturbance. Risk Factors:      Hypertension, hyperlipidemia, Diabetes, past history of                    smoking, prior CVA. Other Factors:      Dementia, CKD III. Limitations        Today's exam was limited due to the body habitus of the                    patient, the high bifurcation of the carotid and tense neck. Comparison Study:  Prior normal carotid duplex done 07/16/17. Normal CTA of the                    neck done 03/15/2023 Performing Technologist: Sherren Kerns RVS  Examination Guidelines: A complete evaluation includes B-mode imaging, spectral Doppler, color Doppler, and power Doppler as needed of all accessible portions of each vessel. Bilateral testing is considered an integral part of a complete examination. Limited examinations for reoccurring indications may be performed as noted.  Right Carotid Findings: +----------+--------+--------+--------+------------------+------------------+           PSV cm/sEDV cm/sStenosisPlaque DescriptionComments           +----------+--------+--------+--------+------------------+------------------+ CCA Prox  68      10  intimal thickening +----------+--------+--------+--------+------------------+------------------+ CCA Distal74      11                                intimal thickening +----------+--------+--------+--------+------------------+------------------+ ICA Prox  55      13              heterogenous                         +----------+--------+--------+--------+------------------+------------------+ ICA Mid   49      14                                tortuous           +----------+--------+--------+--------+------------------+------------------+ ICA Distal48      15                                tortuous           +----------+--------+--------+--------+------------------+------------------+ ECA       66      12                                                   +----------+--------+--------+--------+------------------+------------------+ +----------+--------+-------+--------------+-------------------+           PSV  cm/sEDV cmsDescribe      Arm Pressure (mmHG) +----------+--------+-------+--------------+-------------------+ Subclavian               Not identified                    +----------+--------+-------+--------------+-------------------+ +---------+--------+--+--------+-+ VertebralPSV cm/s31EDV cm/s6 +---------+--------+--+--------+-+  Left Carotid Findings: +----------+--------+--------+--------+------------------+------------------+           PSV cm/sEDV cm/sStenosisPlaque DescriptionComments           +----------+--------+--------+--------+------------------+------------------+ CCA Prox  59      15                                intimal thickening +----------+--------+--------+--------+------------------+------------------+ CCA Distal61      6                                 intimal thickening +----------+--------+--------+--------+------------------+------------------+ ICA Prox  52      13              heterogenous                         +----------+--------+--------+--------+------------------+------------------+ ICA Mid   46      8                                 tortuous           +----------+--------+--------+--------+------------------+------------------+ ICA Distal49      11                                tortuous           +----------+--------+--------+--------+------------------+------------------+  ECA       52      11                                                   +----------+--------+--------+--------+------------------+------------------+ +----------+--------+--------+--------------+-------------------+           PSV cm/sEDV cm/sDescribe      Arm Pressure (mmHG) +----------+--------+--------+--------------+-------------------+ Subclavian                Not identified                    +----------+--------+--------+--------------+-------------------+ +---------+--------+--+--------+--+ VertebralPSV cm/s38EDV cm/s11  +---------+--------+--+--------+--+   Summary: Right Carotid: The extracranial vessels were near-normal with only minimal wall                thickening or plaque. Left Carotid: The extracranial vessels were near-normal with only minimal wall               thickening or plaque. Vertebrals:  Bilateral vertebral arteries demonstrate antegrade flow. Subclavians: Not assessed. *See table(s) above for measurements and observations.     Preliminary    MR BRAIN WO CONTRAST Result Date: 11/06/2023 CLINICAL DATA:  Neuro deficit, acute, stroke suspected EXAM: MRI HEAD WITHOUT CONTRAST MRA HEAD WITHOUT CONTRAST TECHNIQUE: Multiplanar, multi-echo pulse sequences of the brain and surrounding structures were acquired without intravenous contrast. Angiographic images of the Circle of Willis were acquired using MRA technique without intravenous contrast. COMPARISON:  None Available. FINDINGS: MRI HEAD FINDINGS Brain: Small acute infarct in the right paramidline midbrain. Slight edema without mass effect. No evidence of acute hemorrhage. Many foci of susceptibility artifact predominantly in the brainstem, basal ganglia and thalami, compatible with chronic microhemorrhages. Moderate T2/FLAIR hyperintensities the white matter, compatible with chronic microvascular ischemic disease. Remote lacunar infarcts in the corona radiata bilaterally. No mass lesion, midline shift or hydrocephalus Vascular: See below. Skull and upper cervical spine: Normal marrow signal. Sinuses/Orbits: No acute or significant finding. Other: No mastoid effusions. MRA HEAD FINDINGS Anterior circulation: Bilateral intracranial ICAs, MCAs, and ACAs are patent without proximal hemodynamically significant stenosis. Azygos ACA, anatomic variant. Posterior circulation: Bilateral intradural vertebral arteries, basilar artery and bilateral posterior cerebral arteries are patent. Advanced right P2 PCA stenosis. Anatomic variants: Detailed above. IMPRESSION: MRI: 1.  Small acute infarct in the right paramidline midbrain. 2. Many chronic microhemorrhages, likely due to chronic hypertension. 3. Moderate chronic microvascular ischemic disease. MRA: 1. No emergent large vessel occlusion. 2. Advanced right P2 PCA stenosis. Electronically Signed   By: Feliberto Harts M.D.   On: 11/06/2023 02:31   MR ANGIO HEAD WO CONTRAST Result Date: 11/06/2023 CLINICAL DATA:  Neuro deficit, acute, stroke suspected EXAM: MRI HEAD WITHOUT CONTRAST MRA HEAD WITHOUT CONTRAST TECHNIQUE: Multiplanar, multi-echo pulse sequences of the brain and surrounding structures were acquired without intravenous contrast. Angiographic images of the Circle of Willis were acquired using MRA technique without intravenous contrast. COMPARISON:  None Available. FINDINGS: MRI HEAD FINDINGS Brain: Small acute infarct in the right paramidline midbrain. Slight edema without mass effect. No evidence of acute hemorrhage. Many foci of susceptibility artifact predominantly in the brainstem, basal ganglia and thalami, compatible with chronic microhemorrhages. Moderate T2/FLAIR hyperintensities the white matter, compatible with chronic microvascular ischemic disease. Remote lacunar infarcts in the corona radiata bilaterally. No mass lesion, midline shift or hydrocephalus Vascular: See below. Skull and upper  cervical spine: Normal marrow signal. Sinuses/Orbits: No acute or significant finding. Other: No mastoid effusions. MRA HEAD FINDINGS Anterior circulation: Bilateral intracranial ICAs, MCAs, and ACAs are patent without proximal hemodynamically significant stenosis. Azygos ACA, anatomic variant. Posterior circulation: Bilateral intradural vertebral arteries, basilar artery and bilateral posterior cerebral arteries are patent. Advanced right P2 PCA stenosis. Anatomic variants: Detailed above. IMPRESSION: MRI: 1. Small acute infarct in the right paramidline midbrain. 2. Many chronic microhemorrhages, likely due to chronic  hypertension. 3. Moderate chronic microvascular ischemic disease. MRA: 1. No emergent large vessel occlusion. 2. Advanced right P2 PCA stenosis. Electronically Signed   By: Feliberto Harts M.D.   On: 11/06/2023 02:31   CT HEAD WO CONTRAST Result Date: 11/06/2023 CLINICAL DATA:  Ophthalmoplegia EXAM: CT HEAD WITHOUT CONTRAST TECHNIQUE: Contiguous axial images were obtained from the base of the skull through the vertex without intravenous contrast. RADIATION DOSE REDUCTION: This exam was performed according to the departmental dose-optimization program which includes automated exposure control, adjustment of the mA and/or kV according to patient size and/or use of iterative reconstruction technique. COMPARISON:  CT head March 14, 2023 FINDINGS: Brain: No evidence of acute infarction, hemorrhage, hydrocephalus, extra-axial collection or mass lesion/mass effect. Similar patchy white matter hypodensities, compatible with chronic microvascular ischemic disease. Similar remote lacunar infarcts involving the thalami and potentially basal ganglia. Vascular: Calcific atherosclerosis. No hyperdense vessel identified. Skull: No acute fracture. Sinuses/Orbits: Clear sinuses.  No acute orbital findings. Other: No mastoid effusions. IMPRESSION: 1. No evidence of acute intracranial abnormality.  Stable head CT. 2. Chronic microvascular ischemic disease. Electronically Signed   By: Feliberto Harts M.D.   On: 11/06/2023 00:37     PHYSICAL EXAM  Temp:  [97.8 F (36.6 C)-98.7 F (37.1 C)] 98 F (36.7 C) (03/29 1011) Pulse Rate:  [61-84] 78 (03/29 1049) Resp:  [15-19] 19 (03/29 1011) BP: (103-130)/(54-70) 126/70 (03/29 1049) SpO2:  [91 %-100 %] 98 % (03/29 1049) Weight:  [90.7 kg] 90.7 kg (03/28 2258)  General - Well nourished, well developed, in no apparent distress.  Ophthalmologic - fundi not visualized due to noncooperation.  Cardiovascular - Regular rhythm and rate.  Neuro - awake, alert, eyes open,  orientated to place, but not to age, time. No aphasia but positive speech, moderate psychomotor slowing and dysarthria, but following all simple commands. Able to name 3/4 and repeat simple sentences in dysarthric voice. Visual field full, but right eye INO, PERRL. No significant facial droop. Tongue midline. Bilateral UEs 5/5, no drift. Bilaterally LEs 4/5, no drift. Sensation symmetrical bilaterally subjectively, b/l FTN intact but slow, gait not tested.    ASSESSMENT/PLAN Mr. Deone Leifheit Headrick is a 83 y.o. male with history of dementia, hypertension, hyperlipidemia, diabetes, Bell's palsy, stroke admitted for diplopia and off-balance. No TNK given due to outside window.    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 Patient counseled to be compliant with his antithrombotic medications Ongoing aggressive stroke risk factor management Therapy recommendations: Pending Disposition: Pending  History of stroke 03/2023 left CR small infarct, incidental finding, CT head and neck showed right P2 stenosis.  EF 55 to 60%.  LDL 55,  A1c 9.4.  Discharged on DAPT and Lipitor 80.  Patient still has residual dysarthria.  Diabetes HgbA1c 8.8 goal < 7.0 Uncontrolled CBG monitoring SSI DM education and close PCP follow up  Hypertension Stable Long term BP goal normotensive  Hyperlipidemia Home meds: Lipitor 80 LDL 53, goal < 70 Now on Lipitor 80 Continue statin at discharge  Other Stroke Risk Factors Advanced age  Other Active Problems Dementia, on home Aricept and Namenda Left Bell's palsy 03/2023  Hospital day # 0  I spent additional  inpatient 30 minutes face-to-face time with the patient, more than 50% of which was spent in counseling and coordination of care, reviewing test results, images and medication, and discussing the diagnosis, treatment plan and potential prognosis. This patient's care requiresreview of multiple databases, neurological assessment, discussion with family, other specialists and medical decision making of high complexity.    Marvel Plan, MD PhD Stroke Neurology 11/06/2023 2:17 PM    To contact Stroke Continuity provider, please refer to [email or web address]. After hours, contact General Neurology

## 2023-11-06 NOTE — ED Notes (Signed)
Pt returns from Ultrasound.

## 2023-11-06 NOTE — ED Provider Notes (Signed)
 [facility] AT [facility] Provider Note  CSN: [id number] Arrival date & time: 11/05/23 2239  Chief Complaint(s) Cerebrovascular Accident  HPI Scott Vasquez is a 83 y.o. 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  Last seen normal around 3 PM.  At 1900, the patient's son noted that patient's eyes were not tracking equally and patient had more instability walking with a cane using the left side.  Patient cannot provide details of incident.  The history is provided by a relative.    Past Medical History Past Medical History:  Diagnosis Date   Anemia    iron deficiency   Diabetes mellitus    type II   Diverticulosis of colon    Hypertension    Stroke (HCC)    numbness in left hand   Thyroid nodule    Patient Active Problem List   Diagnosis Date Noted   Acute ischemic stroke (HCC) 03/15/2023   Aphasia 03/12/2023   Hypothyroidism 01/02/2022   CKD (chronic kidney disease), stage III (HCC) 01/01/2022   Stroke (HCC) 01/01/2022   Mild dementia without behavioral disturbance, psychotic disturbance, mood disturbance, or anxiety (HCC) 05/18/2019   Thyroid mass 04/29/2015   DM2 (diabetes mellitus, type 2) (HCC)    Acute lacunar stroke (HCC)    Stroke with cerebral ischemia (HCC)    Thalamic infarct, acute (HCC) 10/20/2014   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 Authorizing Provider  ACCU-CHEK AVIVA PLUS test strip TEST BLOOD SUGAR TWICE DAILY 03/17/22   Nafziger, Kandee Keen, NP   amLODipine (NORVASC) 10 MG tablet Take 0.5 tablets (5 mg total) by mouth daily. 10/01/23 09/30/24  [place], Betty G, MD  aspirin 81 MG chewable tablet Chew 1 tablet (81 mg total) by mouth daily. 01/04/22   Tyrone Nine, MD  atorvastatin (LIPITOR) 80 MG tablet TAKE 1 TABLET EVERY DAY 02/10/23   Nafziger, Kandee Keen, NP  Blood Glucose Monitoring Suppl (ACCU-CHEK GUIDE) w/Device KIT USE AS DIRECTED 03/17/22   Nafziger, Kandee Keen, NP  citalopram (CELEXA) 20 MG tablet TAKE 1 TABLET EVERY DAY 07/19/23   Marcos Eke, PA-C  donepezil (ARICEPT) 10 MG tablet TAKE 1 TABLET AT BEDTIME 07/19/23   Wertman, Sung Amabile, PA-C  glipiZIDE (GLUCOTROL XL) 10 MG 24 hr tablet TAKE 1 TABLET EVERY DAY WITH BREAKFAST 10/01/23   Nafziger, Kandee Keen, NP  insulin glargine (LANTUS SOLOSTAR) 100 UNIT/ML Solostar Pen Inject 10 Units into the skin daily. Patient not taking: Reported on 10/01/2023    [provider]  Insulin Pen Needle (PEN NEEDLES) 31G X 5 MM MISC Use with insulin pen Patient not taking: Reported on 10/01/2023 07/15/23   [name], NP  levothyroxine (SYNTHROID) 50 MCG tablet TAKE 1 TABLET EVERY DAY 07/20/23   Nafziger, Kandee Keen, NP  memantine (NAMENDA) 10 MG tablet TAKE 1 TABLET TWICE DAILY 07/19/23   Gwynneth Munson, Sung Amabile, PA-C  Semaglutide (OZEMPIC, 0.25 OR 0.5 MG/DOSE, [place]) Inject 0.25 mg into the skin once a week. Waiting to start once PAP product arrives    [provider]  Allergies Actos [pioglitazone] and Metformin and related  Review of Systems Review of Systems As noted in HPI  Physical Exam Vital Signs  I have reviewed the triage vital signs BP 130/69   Pulse 84   Temp 98.7 F (37.1 C) (Oral)   Resp 18   Ht 5\' 9"  (1.753 m)   Wt 90.7 kg   SpO2 100%   BMI 29.53 kg/m   Physical Exam Vitals reviewed.  Constitutional:      General: He is not in acute distress.    Appearance:  He is well-developed. He is not diaphoretic.  HENT:     Head: Normocephalic and atraumatic.     Nose: Nose normal.  Eyes:     General: No scleral icterus.       Right eye: No discharge.        Left eye: No discharge.     Conjunctiva/sclera: Conjunctivae normal.     Pupils: Pupils are equal, round, and reactive to light.  Cardiovascular:     Rate and Rhythm: Normal rate and regular rhythm.     Heart sounds: No murmur heard.    No friction rub. No gallop.  Pulmonary:     Effort: Pulmonary effort is normal. No respiratory distress.     Breath sounds: Normal breath sounds. No stridor. No rales.  Abdominal:     General: There is no distension.     Palpations: Abdomen is soft.     Tenderness: There is no abdominal tenderness.  Musculoskeletal:        General: No tenderness.     Cervical back: Normal range of motion and neck supple.  Skin:    General: Skin is warm and dry.     Findings: No erythema or rash.  Neurological:     Mental Status: He is alert and oriented to person, place, and time.     Comments: Mental Status:  Alert and oriented to person Attention and concentration normal.  Speech slight dysarthria   Cranial Nerves:  II Visual Fields: Intact to confrontation. Visual fields intact. III, IV, VI: Pupils equal and reactive to light and near. Right eye inward gaze defect (CNIII). V Facial Sensation: Normal. No weakness of masticatory muscles  VII: No facial weakness or asymmetry  VIII Auditory Acuity: Grossly normal  IX/X: The uvula is midline; the palate elevates symmetrically  XI: Normal sternocleidomastoid and trapezius strength  XII: The tongue is midline. No atrophy or fasciculations.   Motor System: Muscle Strength: 5/5 and symmetric in the upper and lower extremities. No pronation or drift.  Muscle Tone: Tone and muscle bulk are normal in the upper and lower extremities.  Coordination: Intact finger-to-nose. No tremor.  Sensation: Intact to light touch. test.   Gait: deferred      ED Results and Treatments Labs (all labs ordered are listed, but only abnormal results are displayed) Labs Reviewed  CBC - Abnormal; Notable for the following components:      Result Value   RBC 3.86 (*)    Hemoglobin 11.1 (*)    HCT 35.6 (*)    All other components within normal limits  DIFFERENTIAL - Abnormal; Notable for the following components:   Eosinophils Absolute 0.7 (*)    All other components within normal limits  COMPREHENSIVE METABOLIC PANEL WITH GFR - Abnormal; Notable for the following components:   Glucose, Bld 246 (*)    BUN 25 (*)    Creatinine, Ser 2.05 (*)    Total Protein 6.4 (*)  GFR, Estimated 31 (*)    All other components within normal limits  CBG MONITORING, ED - Abnormal; Notable for the following components:   Glucose-Capillary 256 (*)    All other components within normal limits  I-STAT CHEM 8, ED - Abnormal; Notable for the following components:   BUN 29 (*)    Creatinine, Ser 2.20 (*)    Glucose, Bld 222 (*)    Hemoglobin 12.6 (*)    HCT 37.0 (*)    All other components within normal limits  ETHANOL  PROTIME-INR  APTT  RAPID URINE DRUG SCREEN, HOSP PERFORMED  URINALYSIS, ROUTINE W REFLEX MICROSCOPIC                                                                                                                         EKG  EKG Interpretation Date/Time:  Friday November 05 2023 22:55:13 EDT Ventricular Rate:  74 PR Interval:  184 QRS Duration:  78 QT Interval:  372 QTC Calculation: 412 R Axis:   40  Text Interpretation: Normal sinus rhythm Nonspecific T wave abnormality Abnormal ECG When compared with ECG of 14-Mar-2023 16:46, PREVIOUS ECG IS PRESENT Confirmed by Drema Pry [phone]) on 11/06/2023 12:30:04 AM       Radiology MR BRAIN WO CONTRAST Result Date: 11/06/2023 CLINICAL DATA:  Neuro deficit, acute, stroke suspected EXAM: MRI HEAD WITHOUT CONTRAST MRA HEAD WITHOUT CONTRAST TECHNIQUE: Multiplanar,  multi-echo pulse sequences of the brain and surrounding structures were acquired without intravenous contrast. Angiographic images of the Circle of Willis were acquired using MRA technique without intravenous contrast. COMPARISON:  None Available. FINDINGS: MRI HEAD FINDINGS Brain: Small acute infarct in the right paramidline midbrain. Slight edema without mass effect. No evidence of acute hemorrhage. Many foci of susceptibility artifact predominantly in the brainstem, basal ganglia and thalami, compatible with chronic microhemorrhages. Moderate T2/FLAIR hyperintensities the white matter, compatible with chronic microvascular ischemic disease. Remote lacunar infarcts in the corona radiata bilaterally. No mass lesion, midline shift or hydrocephalus Vascular: See below. Skull and upper cervical spine: Normal marrow signal. Sinuses/Orbits: No acute or significant finding. Other: No mastoid effusions. MRA HEAD FINDINGS Anterior circulation: Bilateral intracranial ICAs, MCAs, and ACAs are patent without proximal hemodynamically significant stenosis. Azygos ACA, anatomic variant. Posterior circulation: Bilateral intradural vertebral arteries, basilar artery and bilateral posterior cerebral arteries are patent. Advanced right P2 PCA stenosis. Anatomic variants: Detailed above. IMPRESSION: MRI: 1. Small acute infarct in the right paramidline midbrain. 2. Many chronic microhemorrhages, likely due to chronic hypertension. 3. Moderate chronic microvascular ischemic disease. MRA: 1. No emergent large vessel occlusion. 2. Advanced right P2 PCA stenosis. Electronically Signed   By: Feliberto Harts M.D.   On: 11/06/2023 02:31   MR ANGIO HEAD WO CONTRAST Result Date: 11/06/2023 CLINICAL DATA:  Neuro deficit, acute, stroke suspected EXAM: MRI HEAD WITHOUT CONTRAST MRA HEAD WITHOUT CONTRAST TECHNIQUE: Multiplanar, multi-echo pulse sequences of the brain and surrounding structures were acquired without intravenous contrast.  Angiographic images of the Circle  of Willis were acquired using MRA technique without intravenous contrast. COMPARISON:  None Available. FINDINGS: MRI HEAD FINDINGS Brain: Small acute infarct in the right paramidline midbrain. Slight edema without mass effect. No evidence of acute hemorrhage. Many foci of susceptibility artifact predominantly in the brainstem, basal ganglia and thalami, compatible with chronic microhemorrhages. Moderate T2/FLAIR hyperintensities the white matter, compatible with chronic microvascular ischemic disease. Remote lacunar infarcts in the corona radiata bilaterally. No mass lesion, midline shift or hydrocephalus Vascular: See below. Skull and upper cervical spine: Normal marrow signal. Sinuses/Orbits: No acute or significant finding. Other: No mastoid effusions. MRA HEAD FINDINGS Anterior circulation: Bilateral intracranial ICAs, MCAs, and ACAs are patent without proximal hemodynamically significant stenosis. Azygos ACA, anatomic variant. Posterior circulation: Bilateral intradural vertebral arteries, basilar artery and bilateral posterior cerebral arteries are patent. Advanced right P2 PCA stenosis. Anatomic variants: Detailed above. IMPRESSION: MRI: 1. Small acute infarct in the right paramidline midbrain. 2. Many chronic microhemorrhages, likely due to chronic hypertension. 3. Moderate chronic microvascular ischemic disease. MRA: 1. No emergent large vessel occlusion. 2. Advanced right P2 PCA stenosis. Electronically Signed   By: Feliberto Harts M.D.   On: 11/06/2023 02:31   CT HEAD WO CONTRAST Result Date: 11/06/2023 CLINICAL DATA:  Ophthalmoplegia EXAM: CT HEAD WITHOUT CONTRAST TECHNIQUE: Contiguous axial images were obtained from the base of the skull through the vertex without intravenous contrast. RADIATION DOSE REDUCTION: This exam was performed according to the departmental dose-optimization program which includes automated exposure control, adjustment of the mA and/or kV  according to patient size and/or use of iterative reconstruction technique. COMPARISON:  CT head March 14, 2023 FINDINGS: Brain: No evidence of acute infarction, hemorrhage, hydrocephalus, extra-axial collection or mass lesion/mass effect. Similar patchy white matter hypodensities, compatible with chronic microvascular ischemic disease. Similar remote lacunar infarcts involving the thalami and potentially basal ganglia. Vascular: Calcific atherosclerosis. No hyperdense vessel identified. Skull: No acute fracture. Sinuses/Orbits: Clear sinuses.  No acute orbital findings. Other: No mastoid effusions. IMPRESSION: 1. No evidence of acute intracranial abnormality.  Stable head CT. 2. Chronic microvascular ischemic disease. Electronically Signed   By: Feliberto Harts M.D.   On: 11/06/2023 00:37    Medications Ordered in ED Medications - No data to display Procedures Procedures  (including critical care time) Medical Decision Making / ED Course   Medical Decision Making Amount and/or Complexity of Data Reviewed Labs: ordered. Decision-making details documented in ED Course. Radiology: ordered and independent interpretation performed. Decision-making details documented in ED Course. ECG/medicine tests: ordered and independent interpretation performed. Decision-making details documented in ED Course.  Risk Decision regarding hospitalization.    Patient presents with ophthalmoplegia. Out of TnK window.    Differential diagnosis considered.  Workup below. 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  Will need to assess for CVA. CT negative for ICH. Consulted with Dr. Derry Lory who recommended MRI and MRA which showed acute right midbrain stroke. Rest of the labs without electrolyte derangements.  He does have hyperglycemia without DKA.  Renal sufficiency with mild AKI.  Clinical Course as of 11/06/23 0550  Sat Nov 06, 2023  [id number] Consulted with neurology and updated them on findings.   They requested patient be admitted to medicine.  They will evaluate the patient and follow along during admission. [PC]  [phone] Spoke with Dr. Lazarus Salines with Hospitalist service who will admit.  Patient and son updated [PC]    Clinical Course User Index [PC] Casten Floren, Amadeo Garnet, MD    Final Clinical Impression(s) /  ED Diagnoses Final diagnoses:  Acute CVA (cerebrovascular accident) [facility])    This chart was dictated using voice recognition software.  Despite best efforts to proofread,  errors can occur which can change the documentation meaning.    Nira Conn, MD 11/06/23 [phone]

## 2023-11-06 NOTE — Progress Notes (Signed)
 VASCULAR LAB    Carotid duplex has been performed.  See CV proc for preliminary results.   Aayana Reinertsen, RVT 11/06/2023, 9:48 AM

## 2023-11-06 NOTE — H&P (Signed)
 History and Physical    Scott Vasquez MVH:[id number] DOB: 11/12/1940 DOA: 11/05/2023  PCP: [name], NP   Patient coming from: Home   Chief Complaint:  Chief Complaint  Patient presents with   Cerebrovascular Accident    HPI:  Scott Vasquez is a 83 y.o. male with hx of dementia, prior CVA, hypertension, HLD, diabetes, CKD 3, hypothyroidism, who presents with abnormal eye movement.  Last seen normal at 5 PM.  Around 7 PM some visited him and noted that he had the abnormal movements in the left eye.  Additionally having imbalance while walking.  Son was worried about possible stroke and brought him to the hospital.  Otherwise at baseline patient has dysarthria and possibly word finding difficulties. No headaches, other visual change, numbness or weakness in extremities.  No other recent illness.   Review of Systems:  ROS complete and negative except as marked above   Allergies  Allergen Reactions   Actos [Pioglitazone] Other (See Comments)    Diarrhea    Metformin And Related Other (See Comments)    Diarrhea      Prior to Admission medications   Medication Sig Start Date End Date Taking? Authorizing Provider  ACCU-CHEK AVIVA PLUS test strip TEST BLOOD SUGAR TWICE DAILY 03/17/22   Nafziger, Kandee Keen, NP  amLODipine (NORVASC) 10 MG tablet Take 0.5 tablets (5 mg total) by mouth daily. 10/01/23 09/30/24  [place], Betty G, MD  aspirin 81 MG chewable tablet Chew 1 tablet (81 mg total) by mouth daily. 01/04/22   Tyrone Nine, MD  atorvastatin (LIPITOR) 80 MG tablet TAKE 1 TABLET EVERY DAY 02/10/23   Nafziger, Kandee Keen, NP  Blood Glucose Monitoring Suppl (ACCU-CHEK GUIDE) w/Device KIT USE AS DIRECTED 03/17/22   Nafziger, Kandee Keen, NP  citalopram (CELEXA) 20 MG tablet TAKE 1 TABLET EVERY DAY 07/19/23   Marcos Eke, PA-C  donepezil (ARICEPT) 10 MG tablet TAKE 1 TABLET AT BEDTIME 07/19/23   Wertman, Sung Amabile, PA-C  glipiZIDE (GLUCOTROL XL) 10 MG 24 hr tablet TAKE 1 TABLET EVERY DAY WITH  BREAKFAST 10/01/23   Nafziger, Kandee Keen, NP  insulin glargine (LANTUS SOLOSTAR) 100 UNIT/ML Solostar Pen Inject 10 Units into the skin daily. Patient not taking: Reported on 10/01/2023    [provider]  Insulin Pen Needle (PEN NEEDLES) 31G X 5 MM MISC Use with insulin pen Patient not taking: Reported on 10/01/2023 07/15/23   [name], NP  levothyroxine (SYNTHROID) 50 MCG tablet TAKE 1 TABLET EVERY DAY 07/20/23   Nafziger, Kandee Keen, NP  memantine (NAMENDA) 10 MG tablet TAKE 1 TABLET TWICE DAILY 07/19/23   Gwynneth Munson, Sung Amabile, PA-C  Semaglutide (OZEMPIC, 0.25 OR 0.5 MG/DOSE, [place]) Inject 0.25 mg into the skin once a week. Waiting to start once PAP product arrives    [provider]    Past Medical History:  Diagnosis Date   Anemia    iron deficiency   Diabetes mellitus    type II   Diverticulosis of colon    Hypertension    Stroke (HCC)    numbness in left hand   Thyroid nodule     Past Surgical History:  Procedure Laterality Date   BACK SURGERY     CATARACT EXTRACTION, BILATERAL     SHOULDER SURGERY  08/2007   Supple   THYROID LOBECTOMY Left 04/29/2015   THYROID LOBECTOMY Left 04/29/2015   Procedure: THYROID LOBECTOMY;  Surgeon: Darnell Level, MD;  Location: [facility] OR;  Service: General;  Laterality: Left;  reports that he quit smoking about 65 years ago. His smoking use included cigarettes. He started smoking about 72 years ago. He has a 10 pack-year smoking history. He has never used smokeless tobacco. He reports that he does not drink alcohol and does not use drugs.  Family History  Problem Relation Age of Onset   Diabetes Other    Diabetes Father        Died from diabetic coma in 12-07-1970   Hypertension Father    Stroke Mother        Died in 50   Hypertension Mother      Physical Exam: Vitals:   11/06/23 0115 11/06/23 0215 11/06/23 0219 11/06/23 0400  BP:  116/65 116/65 130/69  Pulse: 63 61 61 84  Resp: 15  16 18   Temp:   98.7 F (37.1 C)   TempSrc:   Oral    SpO2: 100% 99% 99% 100%  Weight:      Height:        Gen: Awake, alert, chronically ill-appearing.  CV: Regular, normal S1, S2, no murmurs  Resp: Normal WOB, CTAB  Abd: Flat, normoactive, nontender MSK: Symmetric, no edema  Skin: No rashes or lesions to exposed skin  Neuro: Alert and interactive, speech is dysarthric, oriented to self only, CN II through XII with R INO also ? CN XII deficit with tongue deviation to L. Motor is 5/5 and symmetric, past pointing with FTN on the R.  Psych: euthymic, appropriate    Data review:   Labs reviewed, notable for:   Cr 2.2 (Baseline 1.4 - 1.7)  Glucose 222    Micro:  Results for orders placed or performed in visit on 10/01/23  Urine Culture     Status: None   Collection Time: 10/01/23  4:49 PM  Result Value Ref Range Status   MICRO NUMBER: [id number]  Final   SPECIMEN QUALITY: Adequate  Final   Sample Source URINE  Final   STATUS: FINAL  Final   Result: No Growth  Final    Imaging reviewed:  MR BRAIN WO CONTRAST Result Date: 11/06/2023 CLINICAL DATA:  Neuro deficit, acute, stroke suspected EXAM: MRI HEAD WITHOUT CONTRAST MRA HEAD WITHOUT CONTRAST TECHNIQUE: Multiplanar, multi-echo pulse sequences of the brain and surrounding structures were acquired without intravenous contrast. Angiographic images of the Circle of Willis were acquired using MRA technique without intravenous contrast. COMPARISON:  None Available. FINDINGS: MRI HEAD FINDINGS Brain: Small acute infarct in the right paramidline midbrain. Slight edema without mass effect. No evidence of acute hemorrhage. Many foci of susceptibility artifact predominantly in the brainstem, basal ganglia and thalami, compatible with chronic microhemorrhages. Moderate T2/FLAIR hyperintensities the white matter, compatible with chronic microvascular ischemic disease. Remote lacunar infarcts in the corona radiata bilaterally. No mass lesion, midline shift or hydrocephalus Vascular: See below. Skull  and upper cervical spine: Normal marrow signal. Sinuses/Orbits: No acute or significant finding. Other: No mastoid effusions. MRA HEAD FINDINGS Anterior circulation: Bilateral intracranial ICAs, MCAs, and ACAs are patent without proximal hemodynamically significant stenosis. Azygos ACA, anatomic variant. Posterior circulation: Bilateral intradural vertebral arteries, basilar artery and bilateral posterior cerebral arteries are patent. Advanced right P2 PCA stenosis. Anatomic variants: Detailed above. IMPRESSION: MRI: 1. Small acute infarct in the right paramidline midbrain. 2. Many chronic microhemorrhages, likely due to chronic hypertension. 3. Moderate chronic microvascular ischemic disease. MRA: 1. No emergent large vessel occlusion. 2. Advanced right P2 PCA stenosis. Electronically Signed   By: Feliberto Harts M.D.   On:  11/06/2023 02:31   MR ANGIO HEAD WO CONTRAST Result Date: 11/06/2023 CLINICAL DATA:  Neuro deficit, acute, stroke suspected EXAM: MRI HEAD WITHOUT CONTRAST MRA HEAD WITHOUT CONTRAST TECHNIQUE: Multiplanar, multi-echo pulse sequences of the brain and surrounding structures were acquired without intravenous contrast. Angiographic images of the Circle of Willis were acquired using MRA technique without intravenous contrast. COMPARISON:  None Available. FINDINGS: MRI HEAD FINDINGS Brain: Small acute infarct in the right paramidline midbrain. Slight edema without mass effect. No evidence of acute hemorrhage. Many foci of susceptibility artifact predominantly in the brainstem, basal ganglia and thalami, compatible with chronic microhemorrhages. Moderate T2/FLAIR hyperintensities the white matter, compatible with chronic microvascular ischemic disease. Remote lacunar infarcts in the corona radiata bilaterally. No mass lesion, midline shift or hydrocephalus Vascular: See below. Skull and upper cervical spine: Normal marrow signal. Sinuses/Orbits: No acute or significant finding. Other: No mastoid  effusions. MRA HEAD FINDINGS Anterior circulation: Bilateral intracranial ICAs, MCAs, and ACAs are patent without proximal hemodynamically significant stenosis. Azygos ACA, anatomic variant. Posterior circulation: Bilateral intradural vertebral arteries, basilar artery and bilateral posterior cerebral arteries are patent. Advanced right P2 PCA stenosis. Anatomic variants: Detailed above. IMPRESSION: MRI: 1. Small acute infarct in the right paramidline midbrain. 2. Many chronic microhemorrhages, likely due to chronic hypertension. 3. Moderate chronic microvascular ischemic disease. MRA: 1. No emergent large vessel occlusion. 2. Advanced right P2 PCA stenosis. Electronically Signed   By: Feliberto Harts M.D.   On: 11/06/2023 02:31   CT HEAD WO CONTRAST Result Date: 11/06/2023 CLINICAL DATA:  Ophthalmoplegia EXAM: CT HEAD WITHOUT CONTRAST TECHNIQUE: Contiguous axial images were obtained from the base of the skull through the vertex without intravenous contrast. RADIATION DOSE REDUCTION: This exam was performed according to the departmental dose-optimization program which includes automated exposure control, adjustment of the mA and/or kV according to patient size and/or use of iterative reconstruction technique. COMPARISON:  CT head March 14, 2023 FINDINGS: Brain: No evidence of acute infarction, hemorrhage, hydrocephalus, extra-axial collection or mass lesion/mass effect. Similar patchy white matter hypodensities, compatible with chronic microvascular ischemic disease. Similar remote lacunar infarcts involving the thalami and potentially basal ganglia. Vascular: Calcific atherosclerosis. No hyperdense vessel identified. Skull: No acute fracture. Sinuses/Orbits: Clear sinuses.  No acute orbital findings. Other: No mastoid effusions. IMPRESSION: 1. No evidence of acute intracranial abnormality.  Stable head CT. 2. Chronic microvascular ischemic disease. Electronically Signed   By: Feliberto Harts M.D.   On:  11/06/2023 00:37    EKG:   Reviewed SR with no acute ischemic changes.   ED Course:  Noted to have CN 3 deficit. Underwent MRI and MRA demonstrating R paramidline midbrain stroke and R P2 stenosis. Neurology consulted and have seen the patient.      Assessment/Plan:  83 y.o. male with hx dementia, prior CVA, hypertension, HLD, diabetes, CKD 3, hypothyroidism, who presents with abnormal eye movement. Found to have R sided INO and R paramidline midbrain stroke.   Acute R paramidline midbrain stroke  R Intranuclear ophthalmoplegia  LKWT 3/28 at 1700, at 1900 son noted abnormal eye movements and imbalance prompting presentation. NIHSS 2. No tPA as out of window and too mild to treat. MRA with R P2 stenosis. No LVO. Etiology per neurology suspected small vessel atherosclerosis.  - Neurology consulted, appreciate recommendations - Antiplatelet with DAPT aspirin 81 mg with plavix 75 mg x 21 days then aspirin 81 mg alone.   - Statin Continue atorvastatin 80 mg daily for now   - Permissive HTN <220/120  -  Check lipids and A1c - Serial neurochecks  - Telemonitoring - TTE with bubble - PT/OT/SLP - DVT prophylaxis per below - Swallow screen then [facility] diet   AKI stage I  Baseline Cr 1.4 - 1.7, elevated to 2.2 on admission. Suspect prerenal with poor intake assoc with dementia.  - Give 1 L IVF then continue oral hydration  - Check PVR   Incidental findings  R P2 stenosis  Cerebral microhemorrhages, felt related to HTN   Chronic medical problems:  Dementia: Continue home Donepezil, Memantine  HTN: Permissive HTN per above, then will aim for gradual normotension. Holding Amlodipine for now  DM type 2: Hold home Glipizide. SSI for very sensitive while inpatient  HLD: Statin per above  Hypothyroidism: Continue home Levothyroxine  Mood D/o: Continue home Citalopram    Body mass index is 29.53 kg/m.    DVT prophylaxis:  Lovenox Code Status:  Full Code Diet:  Diet Orders (From  admission, onward)     Start     Ordered   11/05/23 2325  Diet NPO time specified  Diet effective now       Comments: NPO until stroke swallow screen is complete   11/05/23 2325           Family Communication:  Yes discussed with son at bedside   Consults:  Neurology   Admission status:   Inpatient, Telemetry bed  Severity of Illness: The appropriate patient status for this patient is INPATIENT. Inpatient status is judged to be reasonable and necessary in order to provide the required intensity of service to ensure the patient's safety. The patient's presenting symptoms, physical exam findings, and initial radiographic and laboratory data in the context of their chronic comorbidities is felt to place them at high risk for further clinical deterioration. Furthermore, it is not anticipated that the patient will be medically stable for discharge from the hospital within 2 midnights of admission.   * I certify that at the point of admission it is my clinical judgment that the patient will require inpatient hospital care spanning beyond 2 midnights from the point of admission due to high intensity of service, high risk for further deterioration and high frequency of surveillance required.*   Dolly Rias, MD [facility]  How to contact the [facility] Attending or Consulting provider 7A - 7P or covering provider during after hours 7P -7A, for this patient.  Check the care team in [facility] and look for a) attending/consulting [facility] provider listed and b) the [facility] team listed Log into [email or web address] and use [place]'s universal password to access. If you do not have the password, please contact the hospital operator. Locate the [facility] provider you are looking for under [facility] and page to a number that you can be directly reached. If you still have difficulty reaching the provider, please page the [facility] (Director on Call) for the Hospitalists listed on amion for assistance.  11/06/2023,  6:03 AM

## 2023-11-06 NOTE — ED Notes (Signed)
 PLEASE UPDATE SPOUSE, NUMBER UNDER EMERGENCY CONTACTS

## 2023-11-06 NOTE — Plan of Care (Signed)
 New admission for stroke. AxOx1, room air. On IVF , BG, neuro, and NIH checks.    Problem: Ischemic Stroke/TIA Tissue Perfusion: Goal: Complications of ischemic stroke/TIA will be minimized Outcome: Not Met (add Reason)   Problem: Education: Goal: Knowledge of disease or condition will improve Outcome: Not Met (add Reason) Goal: Knowledge of secondary prevention will improve (MUST DOCUMENT ALL) Outcome: Not Met (add Reason) Goal: Knowledge of patient specific risk factors will improve (DELETE if not current risk factor) Outcome: Not Met (add Reason)   Problem: Nutrition: Goal: Risk of aspiration will decrease Outcome: Not Met (add Reason) Goal: Dietary intake will improve Outcome: Not Met (add Reason)   Problem: Skin Integrity: Goal: Risk for impaired skin integrity will decrease Outcome: Not Met (add Reason)   Problem: Pain Managment: Goal: General experience of comfort will improve and/or be controlled Outcome: Not Met (add Reason)   Problem: Safety: Goal: Ability to remain free from injury will improve Outcome: Not Met (add Reason)

## 2023-11-06 NOTE — ED Notes (Signed)
 Patient transported to MRI

## 2023-11-06 NOTE — Progress Notes (Signed)
  Echocardiogram 2D Echocardiogram has been performed.  Leda Roys RDCS 11/06/2023, 9:41 AM

## 2023-11-06 NOTE — Evaluation (Signed)
 Speech Language Pathology Evaluation Patient Details Name: Scott Vasquez MRN: [id number] DOB: [birth year] Today's Date: 11/06/2023 Time: 4098-1191 SLP Time Calculation (min) (ACUTE ONLY): 24 min  Problem List:  Patient Active Problem List   Diagnosis Date Noted   Acute ischemic stroke (HCC) 03/15/2023   Aphasia 03/12/2023   Hypothyroidism 01/02/2022   CKD (chronic kidney disease), stage III (HCC) 01/01/2022   Stroke (HCC) 01/01/2022   Mild dementia without behavioral disturbance, psychotic disturbance, mood disturbance, or anxiety (HCC) 05/18/2019   Thyroid mass 04/29/2015   DM2 (diabetes mellitus, type 2) (HCC)    Acute lacunar stroke (HCC)    Stroke with cerebral ischemia (HCC)    Thalamic infarct, acute (HCC) 10/20/2014   PERSONAL HX OF METHICILLIN RESIST STAPH AUREUS 09/17/2008   Hyperlipidemia 04/01/2007   B12 deficiency 02/08/2007   Iron deficiency anemia 02/08/2007   Essential hypertension 02/08/2007   DIVERTICULOSIS, COLON 02/08/2007   CEREBROVASCULAR ACCIDENT, HX OF 02/08/2007   Past Medical History:  Past Medical History:  Diagnosis Date   Anemia    iron deficiency   Diabetes mellitus    type II   Diverticulosis of colon    Hypertension    Stroke (HCC)    numbness in left hand   Thyroid nodule    Past Surgical History:  Past Surgical History:  Procedure Laterality Date   BACK SURGERY     CATARACT EXTRACTION, BILATERAL     SHOULDER SURGERY  08/2007   Supple   THYROID LOBECTOMY Left 04/29/2015   THYROID LOBECTOMY Left 04/29/2015   Procedure: THYROID LOBECTOMY;  Surgeon: Darnell Level, MD;  Location: [facility] OR;  Service: General;  Laterality: Left;   HPI:  Scott Vasquez is a 83 y.o. male with hx of dementia, prior CVA, hypertension, HLD, diabetes, CKD 3, hypothyroidism, who presents with abnormal eye movement.  Last seen normal at 5 PM.  Around 7 PM some visited him and noted that he had the abnormal movements in the left eye.  Additionally having  imbalance while walking.  Son was worried about possible stroke and brought him to the hospital.  Otherwise at baseline patient has dysarthria and possibly word finding difficulties. MRI revealed on 3/28 Small acute infarct in the right paramidline midbrain. ST consulted for speech/language evaluation.   Assessment / Plan / Recommendation Clinical Impression  Pt seen for speech/language assessment with baseline deficits including dysarthria/expressive aphasia per pt/son's report.  Pt with reduced intelligibility with words-phrases as pt would use neologisms or paraphasias during communicative attempts.  Speech was observed to be 50-75% accurate cumulatively within conversation. Pt oriented to self, place and situation, but unable to verbalize time, but expressive aphasia may be a deterring factor.   Pt able to follow 1-3 step commands without use of cueing, but required additional time intermittently to respond to questions.  Naming impacted during word fluency, responsive and convergent naming tasks.  OME appeared adequate.  Pt responded primarily in 1-3 word responses during interactions, but his son stated he "was reluctant to communicate" prior to this CVA d/t awareness of speech/language deficits.  Pt's primary complaint this hospitalization is diplopia/balance issues with "R eye not tracking" per son's report/observation.  Attention may be impacted d/t visual changes, but a full cognitive assessment was not completed.  ST will f/u x1 for potential need for [facility] to return pt to baseline functioning with speech/language.  Thank you for this consult.    SLP Assessment  SLP Recommendation/Assessment: Patient needs continued Speech Language Pathology Services SLP  Visit Diagnosis: Aphasia (R47.01);Dysarthria and anarthria (R47.1);Attention and concentration deficit;Cognitive communication deficit (R41.841) Attention and concentration deficit following: Cerebral infarction    Recommendations for follow up  therapy are one component of a multi-disciplinary discharge planning process, led by the attending physician.  Recommendations may be updated based on patient status, additional functional criteria and insurance authorization.    Follow Up Recommendations  Other (comment) (TBD)    Assistance Recommended at Discharge  Frequent or constant Supervision/Assistance  Functional Status Assessment Patient has had a recent decline in their functional status and demonstrates the ability to make significant improvements in function in a reasonable and predictable amount of time.  Frequency and Duration min 1 x/week  1 week      SLP Evaluation Cognition  Overall Cognitive Status: Difficult to assess Arousal/Alertness: Awake/alert Orientation Level: Oriented to person;Oriented to situation;Disoriented to time;Oriented to place (expressive aphasia/dysarthria may impact) Attention: Sustained Sustained Attention: Impaired Sustained Attention Impairment: Verbal basic;Functional basic Memory: Impaired Memory Impairment: Decreased recall of new information;Retrieval deficit;Decreased short term memory (DTA what was baseline function vs new needs) Decreased Short Term Memory: Verbal basic;Functional basic Comments: Difficult to assess d/t presence of expressive aphasia/dysarthria       Comprehension  Auditory Comprehension Overall Auditory Comprehension: Appears within functional limits for tasks assessed Commands: Within Functional Limits Conversation: Simple Other Conversation Comments: Son noted pt with limited speaking prior to hospitalization d/ t reluctance because of aphasia/dysarthria Interfering Components: Attention;Visual impairments;Other (comment) (PLOF) EffectiveTechniques: Extra processing time;Repetition Visual Recognition/Discrimination Discrimination: Not tested Reading Comprehension Reading Status: Not tested    Expression Expression Primary Mode of Expression: Verbal Verbal  Expression Overall Verbal Expression: Impaired at baseline Initiation: Impaired Level of Generative/Spontaneous Verbalization: Conversation Repetition: Impaired Level of Impairment: Phrase level Naming: Impairment Responsive: 76-100% accurate Confrontation: Within functional limits Convergent: 50-74% accurate Divergent: 50-74% accurate Verbal Errors: Perseveration;Aware of errors Pragmatics: Unable to assess Interfering Components: Attention;Premorbid deficit Non-Verbal Means of Communication: Not applicable Written Expression Dominant Hand: Right Written Expression: Not tested   Oral / Motor  Oral Motor/Sensory Function Overall Oral Motor/Sensory Function: Within functional limits Motor Speech Overall Motor Speech: Impaired at baseline Respiration: Within functional limits Phonation: Low vocal intensity Resonance: Within functional limits Articulation: Impaired Level of Impairment: Word Intelligibility: Intelligibility reduced Word: 50-74% accurate Phrase: 50-74% accurate Sentence: 50-74% accurate Conversation: 25-49% accurate Motor Planning: Not tested Motor Speech Errors: Not applicable Interfering Components: Premorbid status            Pat Elisavet Buehrer,M.S.,CCC-SLP 11/06/2023, 2:53 PM

## 2023-11-06 NOTE — Consult Note (Signed)
 NEUROLOGY CONSULT NOTE   Date of service: November 06, 2023 Patient Name: Scott Vasquez MRN:  [id number] DOB:  06/23/1941 Chief Complaint: "Right eye INO" Requesting Provider: Nira Conn, *  History of Present Illness  Scott Vasquez is a 83 y.o. male with hx of dementia, diabetes, prior strokes with residual slurred speech, hyperlipidemia who presents with diplopia.  Son reports the patient was at his baseline when he saw him at 1700 on 11/05/2023.  He noticed that around 1900 on 11/05/2023, patient appeared to have difficulty with his vision and seemed a little off balance.  He noticed that his left eye seemed off.  He brought him to the ED for further evaluation and workup.  On my evaluation, patient has right eye INO.  He had an MRI of the brain without contrast which demonstrated a right midbrain punctate infarct which is suspect involves the MLF.  LKW: 1700 on 10/28/2023 Modified rankin score: 3-Moderate disability-requires help but walks WITHOUT assistance IV Thrombolysis: Not offered, outside of window and too mild to treat.   EVT: Not offered, outside window and low suspicion for LVO.    NIHSS components Score: Comment  1a Level of Conscious 0[x]  1[]  2[]  3[]      1b LOC Questions 0[x]  1[]  2[]     Thanks this is July.  1c LOC Commands 0[x]  1[]  2[]       2 Best Gaze 0[x]  1[]  2[]       3 Visual 0[x]  1[]  2[]  3[]      4 Facial Palsy 0[x]  1[]  2[]  3[]      5a Motor Arm - left 0[x]  1[]  2[]  3[]  4[]  UN[]    5b Motor Arm - Right 0[x]  1[]  2[]  3[]  4[]  UN[]    6a Motor Leg - Left 0[x]  1[]  2[]  3[]  4[]  UN[]    6b Motor Leg - Right 0[x]  1[]  2[]  3[]  4[]  UN[]    7 Limb Ataxia 0[x]  1[]  2[]  3[]  UN[]     8 Sensory 0[x]  1[]  2[]  UN[]      9 Best Language 0[x]  1[]  2[]  3[]      10 Dysarthria 0[]  1[x]  2[]  UN[]      11 Extinct. and Inattention 0[x]  1[]  2[]       TOTAL: 2      ROS  Comprehensive ROS performed and pertinent positives documented in HPI   Past History   Past Medical History:   Diagnosis Date   Anemia    iron deficiency   Diabetes mellitus    type II   Diverticulosis of colon    Hypertension    Stroke (HCC)    numbness in left hand   Thyroid nodule     Past Surgical History:  Procedure Laterality Date   BACK SURGERY     CATARACT EXTRACTION, BILATERAL     SHOULDER SURGERY  08/2007   Supple   THYROID LOBECTOMY Left 04/29/2015   THYROID LOBECTOMY Left 04/29/2015   Procedure: THYROID LOBECTOMY;  Surgeon: Darnell Level, MD;  Location: [facility] OR;  Service: General;  Laterality: Left;    Family History: Family History  Problem Relation Age of Onset   Diabetes Other    Diabetes Father        Died from diabetic coma in Dec 12, 1970   Hypertension Father    Stroke Mother        Died in 69   Hypertension Mother     Social History  reports that he quit smoking about 65 years ago. His smoking use included cigarettes. He started smoking about 72 years  ago. He has a 10 pack-year smoking history. He has never used smokeless tobacco. He reports that he does not drink alcohol and does not use drugs.  Allergies  Allergen Reactions   Actos [Pioglitazone] Other (See Comments)    Diarrhea    Metformin And Related Other (See Comments)    Diarrhea      Medications  No current facility-administered medications for this encounter.  Current Outpatient Medications:    ACCU-CHEK AVIVA PLUS test strip, TEST BLOOD SUGAR TWICE DAILY, Disp: 200 strip, Rfl: 3   amLODipine (NORVASC) 10 MG tablet, Take 0.5 tablets (5 mg total) by mouth daily., Disp: , Rfl:    aspirin 81 MG chewable tablet, Chew 1 tablet (81 mg total) by mouth daily., Disp: 30 tablet, Rfl: 2   atorvastatin (LIPITOR) 80 MG tablet, TAKE 1 TABLET EVERY DAY, Disp: 90 tablet, Rfl: 3   Blood Glucose Monitoring Suppl (ACCU-CHEK GUIDE) w/Device KIT, USE AS DIRECTED, Disp: 1 kit, Rfl: 0   citalopram (CELEXA) 20 MG tablet, TAKE 1 TABLET EVERY DAY, Disp: 90 tablet, Rfl: 3   donepezil (ARICEPT) 10 MG tablet, TAKE 1 TABLET AT  BEDTIME, Disp: 90 tablet, Rfl: 3   glipiZIDE (GLUCOTROL XL) 10 MG 24 hr tablet, TAKE 1 TABLET EVERY DAY WITH BREAKFAST, Disp: 90 tablet, Rfl: 3   insulin glargine (LANTUS SOLOSTAR) 100 UNIT/ML Solostar Pen, Inject 10 Units into the skin daily. (Patient not taking: Reported on 10/01/2023), Disp: , Rfl:    Insulin Pen Needle (PEN NEEDLES) 31G X 5 MM MISC, Use with insulin pen (Patient not taking: Reported on 10/01/2023), Disp: 90 each, Rfl: 3   levothyroxine (SYNTHROID) 50 MCG tablet, TAKE 1 TABLET EVERY DAY, Disp: 90 tablet, Rfl: 3   memantine (NAMENDA) 10 MG tablet, TAKE 1 TABLET TWICE DAILY, Disp: 180 tablet, Rfl: 3   Semaglutide (OZEMPIC, 0.25 OR 0.5 MG/DOSE, [place]), Inject 0.25 mg into the skin once a week. Waiting to start once PAP product arrives, Disp: , Rfl:   Vitals   Vitals:   11/06/23 0115 11/06/23 0215 11/06/23 0219 11/06/23 0400  BP:  116/65 116/65 130/69  Pulse: 63 61 61 84  Resp: 15  16 18   Temp:   98.7 F (37.1 C)   TempSrc:   Oral   SpO2: 100% 99% 99% 100%  Weight:      Height:        Body mass index is 29.53 kg/m.  Physical Exam   General: Laying comfortably in bed; in no acute distress.  HENT: Normal oropharynx and mucosa. Normal external appearance of ears and nose.  Neck: Supple, no pain or tenderness  CV: No JVD. No peripheral edema.  Pulmonary: Symmetric Chest rise. Normal respiratory effort.  Abdomen: Soft to touch, non-tender.  Ext: No cyanosis, edema, or deformity  Skin: No rash. Normal palpation of skin.   Musculoskeletal: Normal digits and nails by inspection. No clubbing.   Neurologic Examination  Mental status/Cognition: Alert, oriented to self, place, things this is July, not sure of the year, good attention.  Speech/language: Slurred speech, nonfluent comprehension intact, object naming intact, repetition intact.  Cranial nerves:   CN II Pupils equal and reactive to light, no VF deficits    CN III,IV,VI R eye INO, no gaze preference or deviation, no  nystagmus   CN V normal sensation in V1, V2, and V3 segments bilaterally    CN VII no asymmetry, no nasolabial fold flattening    CN VIII normal hearing to speech  CN IX & X normal palatal elevation, no uvular deviation    CN XI 5/5 head turn and 5/5 shoulder shrug bilaterally    CN XII midline tongue protrusion    Motor:  Muscle bulk: Normal, tone paratonia, tremor slight pill-rolling tremor Mvmt Root Nerve  Muscle Right Left Comments  SA C5/6 Ax Deltoid 5 5   EF C5/6 Mc Biceps 5 5   EE C6/7/8 Rad Triceps 5 5   WF C6/7 Med FCR     WE C7/8 PIN ECU     F Ab C8/T1 U ADM/FDI 5 5   HF L1/2/3 Fem Illopsoas 5 5   KE L2/3/4 Fem Quad 5 5   DF L4/5 D Peron Tib Ant 5 5   PF S1/2 Tibial Grc/Sol 5 5    Sensation:  Light touch Intact throughout   Pin prick    Temperature    Vibration   Proprioception    Coordination/Complex Motor:  - Finger to Nose intact bilaterally - Heel to shin intact bilaterally - Rapid alternating movement are normal - Gait: Deferred. Labs/Imaging/Neurodiagnostic studies   CBC:  Recent Labs  Lab 17-Nov-2023 2329 2023-11-17 2334  WBC 7.5  --   NEUTROABS 4.9  --   HGB 11.1* 12.6*  HCT 35.6* 37.0*  MCV 92.2  --   PLT 243  --    Basic Metabolic Panel:  Lab Results  Component Value Date   NA 141 11/17/2023   K 3.8 November 17, 2023   CO2 25 11-17-23   GLUCOSE 222 (H) 2023-11-17   BUN 29 (H) 2023-11-17   CREATININE 2.20 (H) Nov 17, 2023   CALCIUM 9.0 11/17/2023   GFRNONAA 31 (L) 11/17/2023   GFRAA 49 (L) 07/17/2020   Lipid Panel:  Lab Results  Component Value Date   LDLCALC 74 07/15/2023   HgbA1c:  Lab Results  Component Value Date   HGBA1C 8.5 (H) 07/15/2023   Urine Drug Screen:     Component Value Date/Time   LABOPIA NONE DETECTED 10/20/2014 0220   COCAINSCRNUR NONE DETECTED 10/20/2014 0220   LABBENZ NONE DETECTED 10/20/2014 0220   AMPHETMU NONE DETECTED 10/20/2014 0220   THCU NONE DETECTED 10/20/2014 0220   LABBARB NONE DETECTED 10/20/2014  0220    Alcohol Level     Component Value Date/Time   ETH <10 11-17-23 2329   INR  Lab Results  Component Value Date   INR 1.0 November 17, 2023   APTT  Lab Results  Component Value Date   APTT 28 November 17, 2023   AED levels: No results found for: "PHENYTOIN", "ZONISAMIDE", "LAMOTRIGINE", "LEVETIRACETA"  CT Head without contrast(Personally reviewed): CTH was negative for a large hypodensity concerning for a large territory infarct or hyperdensity concerning for an ICH  MR Angio head without contrast and Carotid Duplex BL(Personally reviewed): No LVO noted on MR angio of head.  Advanced right P2 PCA stenosis.  Ultrasound carotid Doppler pending.  MRI Brain(Personally reviewed): Small acute infarct in the right paramedian midbrain.  ASSESSMENT   Scott Vasquez is a 83 y.o. male with prior history of diabetes, hyperlipidemia who presents with diplopia and found to have a right INO.  MRI of the brain demonstrates a small acute infarct in the right paramedian midbrain.  Suspect etiology is likely small vessel disease secondary to poorly controlled stroke risk factors including diabetes, hyperlipidemia.  RECOMMENDATIONS  - Frequent Neuro checks per stroke unit protocol - Recommend Vascular imaging with US Carotid doppler - Recommend obtaining TTE - Recommend obtaining Lipid panel with LDL -  Please start statin if LDL > 70 - Recommend HbA1c to evaluate for diabetes and how well it is controlled. - Antithrombotic -aspirin 81 mg daily along with Plavix 75 mg daily for 21 days, followed by aspirin 81 mg daily alone. - Recommend DVT ppx - SBP goal - permissive hypertension first 24 h < 220/110. Held home meds.  - Recommend Telemetry monitoring for arrythmia - Recommend bedside swallow screen prior to PO intake. - Stroke education booklet - Recommend PT/OT/SLP consult  ______________________________________________________________________    Signed, Erick Blinks, MD [facility]

## 2023-11-07 DIAGNOSIS — I739 Peripheral vascular disease, unspecified: Secondary | ICD-10-CM

## 2023-11-07 DIAGNOSIS — E119 Type 2 diabetes mellitus without complications: Secondary | ICD-10-CM | POA: Diagnosis not present

## 2023-11-07 DIAGNOSIS — E1100 Type 2 diabetes mellitus with hyperosmolarity without nonketotic hyperglycemic-hyperosmolar coma (NKHHC): Secondary | ICD-10-CM | POA: Diagnosis not present

## 2023-11-07 DIAGNOSIS — I10 Essential (primary) hypertension: Secondary | ICD-10-CM | POA: Diagnosis not present

## 2023-11-07 DIAGNOSIS — I6389 Other cerebral infarction: Secondary | ICD-10-CM | POA: Diagnosis not present

## 2023-11-07 DIAGNOSIS — E785 Hyperlipidemia, unspecified: Secondary | ICD-10-CM

## 2023-11-07 DIAGNOSIS — I639 Cerebral infarction, unspecified: Secondary | ICD-10-CM | POA: Diagnosis not present

## 2023-11-07 LAB — RENAL FUNCTION PANEL
Albumin: 3 g/dL — ABNORMAL LOW (ref 3.5–5.0)
Anion gap: 12 (ref 5–15)
BUN: 17 mg/dL (ref 8–23)
CO2: 20 mmol/L — ABNORMAL LOW (ref 22–32)
Calcium: 8.7 mg/dL — ABNORMAL LOW (ref 8.9–10.3)
Chloride: 107 mmol/L (ref 98–111)
Creatinine, Ser: 1.15 mg/dL (ref 0.61–1.24)
GFR, Estimated: >60 mL/min (ref >60)
Glucose, Bld: 120 mg/dL — ABNORMAL HIGH (ref 70–99)
Phosphorus: 3.7 mg/dL (ref 2.5–4.6)
Potassium: 3.8 mmol/L (ref 3.5–5.1)
Sodium: 139 mmol/L (ref 135–145)

## 2023-11-07 LAB — GLUCOSE, CAPILLARY
Glucose-Capillary: 104 mg/dL — ABNORMAL HIGH (ref 70–99)
Glucose-Capillary: 107 mg/dL — ABNORMAL HIGH (ref 70–99)

## 2023-11-07 MED ORDER — ASPIRIN 81 MG PO CHEW: 81.0000 mg | CHEWABLE_TABLET | Freq: Every day | ORAL | Status: AC

## 2023-11-07 MED ORDER — CLOPIDOGREL BISULFATE 75 MG PO TABS: 75.0000 mg | ORAL_TABLET | Freq: Every day | ORAL | 3 refills | Status: DC

## 2023-11-07 MED ORDER — AMLODIPINE BESYLATE 2.5 MG PO TABS: 2.5000 mg | ORAL_TABLET | Freq: Every day | ORAL | 3 refills | Status: DC

## 2023-11-07 NOTE — Evaluation (Signed)
 Occupational Therapy Evaluation Patient Details Name: Scott Vasquez MRN: [id number] DOB: 02/22/1941 Today's Date: 11/07/2023   History of Present Illness   Pt is an 83 y.o. male presenting 3/28 with abnormal eye movement in L eye and decreased balance. 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 PMH significant for dementia, CVA, HTN, HLD, DM, CKD III, hypothyroidism.     Clinical Impressions Pt evaluated s/p the admission list above. At baseline, pt was living at home with his daughter and completed functional mobility using a cane. Pt reports completing ADLs independently. Upon evaluation, pt was limited by deficits listed below. Pt completed STS with CGA and functional mobility with MIN A requiring verbal cues for self-placement and safe RW management. Pt observed with L lateral lean while engaging in grooming and peri care standing at sink , overshooting when reaching for grooming items on L side, and required CGA to complete task. No LOB observed. OT to continue following pt acutely to address functional needs with discharge recommendations of inpatient therapy services >3hrs/day to maximize functional independence.      If plan is discharge home, recommend the following:   A little help with walking and/or transfers;A little help with bathing/dressing/bathroom;Assistance with cooking/housework;Direct supervision/assist for medications management;Direct supervision/assist for financial management;Assist for transportation;Help with stairs or ramp for entrance;Supervision due to cognitive status     Functional Status Assessment   Patient has had a recent decline in their functional status and demonstrates the ability to make significant improvements in function in a reasonable and predictable amount of time.     Equipment Recommendations   Other (comment) (defer)     Recommendations for Other Services   Rehab consult      Precautions/Restrictions   Precautions Precautions: Fall Recall of Precautions/Restrictions: Impaired Restrictions Weight Bearing Restrictions Per Provider Order: No     Mobility Bed Mobility Overal bed mobility: Needs Assistance             General bed mobility comments: NT; received seated in chair upon arrival    Transfers Overall transfer level: Needs assistance Equipment used: Rolling walker (2 wheels) Transfers: Sit to/from Stand Sit to Stand: Contact guard assist           General transfer comment: CGA provided for safety      Balance Overall balance assessment: Needs assistance Sitting-balance support: Bilateral upper extremity supported, Feet supported Sitting balance-Leahy Scale: Good Sitting balance - Comments: static sitting in chair   Standing balance support: During functional activity, Single extremity supported   Standing balance comment: Pt with lateral lean while standing to engage in grooming task and drifting to L side during functional mobility in room                           ADL either performed or assessed with clinical judgement   ADL Overall ADL's : Needs assistance/impaired Eating/Feeding: Independent;Sitting   Grooming: Wash/dry face;Oral care;Contact guard assist;Standing   Upper Body Bathing: Supervision/ safety;Sitting   Lower Body Bathing: Contact guard assist;Sit to/from stand   Upper Body Dressing : Supervision/safety;Sitting   Lower Body Dressing: Contact guard assist;Sit to/from stand   Toilet Transfer: Minimal assistance;Ambulation;Rolling walker (2 wheels);Regular Toilet   Toileting- Clothing Manipulation and Hygiene: Contact guard assist;Sit to/from stand       Functional mobility during ADLs: Minimal assistance;Rolling walker (2 wheels) General ADL Comments: Pt required MIN A for functional mboility for safe management of RW. Pt observed  with L lateral lean while standing support. Pt with  potential L side visual deficits and impaired gross and FM coordination, observed over shooting when reaching for items on L side     Vision Ability to See in Adequate Light: 1 Impaired Patient Visual Report: Blurring of vision Vision Assessment?: Yes Tracking/Visual Pursuits: Decreased smoothness of horizontal tracking;Decreased smoothness of vertical tracking Visual Fields:  (appears WFL with basic assessment) Additional Comments: nystagmus observed when tracking horizontally and vertically     Perception         Praxis Praxis: Not tested       Pertinent Vitals/Pain Pain Assessment Pain Assessment: No/denies pain     Extremity/Trunk Assessment Upper Extremity Assessment Upper Extremity Assessment: Generalized weakness;Right hand dominant;LUE deficits/detail LUE Deficits / Details: 0-100 degree shoulder flexion, impaired gross and fine coordination, grossly 4/5 elbow strength LUE Coordination: decreased fine motor;decreased gross motor   Lower Extremity Assessment Lower Extremity Assessment: Defer to PT evaluation LLE Deficits / Details: grossly 4/5 to MMT, impaired coordination LLE Sensation: WNL LLE Coordination: decreased fine motor;decreased gross motor   Cervical / Trunk Assessment Cervical / Trunk Assessment: Normal   Communication Communication Communication: Impaired Factors Affecting Communication: Reduced clarity of speech   Cognition Arousal: Alert Behavior During Therapy: WFL for tasks assessed/performed Cognition: Cognition impaired   Orientation impairments: Time Awareness: Online awareness impaired Memory impairment (select all impairments): Short-term memory Attention impairment (select first level of impairment): Selective attention Executive functioning impairment (select all impairments): Organization, Reasoning, Problem solving OT - Cognition Comments: Pt with good carry over from PT session and was able to recall place and situation. Pt  required options to accurately state month and year. Pt required consistent verbal cues for RW management and safety.                 Following commands: Impaired Following commands impaired: Follows one step commands with increased time, Follows multi-step commands inconsistently     Cueing  General Comments   Cueing Techniques: Verbal cues;Gestural cues;Tactile cues  VSS on RA   Exercises     Shoulder Instructions      Home Living Family/patient expects to be discharged to:: Private residence Living Arrangements: Spouse/significant other;Children Available Help at Discharge: Family;Available 24 hours/day Type of Home: House Home Access: Stairs to enter [employer] of Steps: 2 Entrance Stairs-Rails: Right;Left Home Layout: Two level;Able to live on main level with bedroom/bathroom     Bathroom Shower/Tub: Chief Strategy Officer: Standard     Home Equipment: Cane - single point;BSC/3in1;Rollator (4 wheels);Rolling Walker (2 wheels)   Additional Comments: spouse is 18 and not able to physically assist, daughter there "sometimes"  Lives With: Spouse;Daughter    Prior Functioning/Environment Prior Level of Function : Independent/Modified Independent             Mobility Comments: uses cane for mobility, no other falls ADLs Comments: Mod I for ADLs, family assist with the IADLs    OT Problem List: Decreased strength;Decreased range of motion   OT Treatment/Interventions: Self-care/ADL training;Therapeutic exercise;DME and/or AE instruction;Therapeutic activities;Cognitive remediation/compensation;Visual/perceptual remediation/compensation;Patient/family education;Balance training      OT Goals(Current goals can be found in the care plan section)   Acute Rehab OT Goals Patient Stated Goal: none stated OT Goal Formulation: With patient Time For Goal Achievement: 11/21/23 Potential to Achieve Goals: Good ADL Goals Pt Will Perform  Grooming: standing;with supervision Pt Will Perform Upper Body Dressing: with modified independence;sitting Pt Will Perform Lower Body Dressing: with  supervision;sit to/from stand Pt Will Transfer to Toilet: with supervision;ambulating;regular height toilet Pt Will Perform Toileting - Clothing Manipulation and hygiene: with supervision;sit to/from stand Pt/caregiver will Perform Home Exercise Program: Increased strength;Left upper extremity;With theraputty;With written HEP provided   OT Frequency:  Min 2X/week    Co-evaluation              AM-PAC OT "6 Clicks" Daily Activity     Outcome Measure Help from another person eating meals?: None Help from another person taking care of personal grooming?: A Little Help from another person toileting, which includes using toliet, bedpan, or urinal?: A Little Help from another person bathing (including washing, rinsing, drying)?: A Little Help from another person to put on and taking off regular upper body clothing?: A Little Help from another person to put on and taking off regular lower body clothing?: A Little 6 Click Score: 19   End of Session Equipment Utilized During Treatment: Gait belt;Rolling walker (2 wheels) Nurse Communication: Mobility status;Other (comment) (Pt loss tooth while brushing teeth)  Activity Tolerance: Patient tolerated treatment well Patient left: in chair;with call bell/phone within reach;with chair alarm set  OT Visit Diagnosis: Unsteadiness on feet (R26.81);Other abnormalities of gait and mobility (R26.89);Muscle weakness (generalized) (M62.81);History of falling (Z91.81);Hemiplegia and hemiparesis Hemiplegia - Right/Left: Left Hemiplegia - dominant/non-dominant: Non-Dominant Hemiplegia - caused by: Cerebral infarction                Time: 1610-9604 OT Time Calculation (min): 29 min Charges:  OT General Charges $OT Visit: 1 Visit OT Evaluation $OT Eval Moderate Complexity: 1 Mod OT Treatments $Self  Care/Home Management : 8-22 mins  Lynnda Shields 11/07/2023, 10:19 AM

## 2023-11-07 NOTE — Progress Notes (Signed)

## 2023-11-07 NOTE — Progress Notes (Signed)
 STROKE TEAM PROGRESS NOTE   SUBJECTIVE (INTERVAL HISTORY) His son is at the bedside.  Patient sitting in chair, smiling to provider, still has right INO, but improved per son.  PT and OT recommend CIR, but family and patient would like home health therapy   OBJECTIVE Temp:  [97.3 F (36.3 C)-98.7 F (37.1 C)] 97.3 F (36.3 C) (03/30 1135) Pulse Rate:  [50-89] 89 (03/30 1135) Cardiac Rhythm: Sinus bradycardia;Heart block (03/30 0700) Resp:  [16-18] 18 (03/30 1135) BP: (120-144)/(65-81) 120/72 (03/30 1135) SpO2:  [85 %-100 %] 85 % (03/30 1135)  Recent Labs  Lab 11/06/23 1350 11/06/23 1714 11/06/23 2103 11/07/23 0621 11/07/23 1107  GLUCAP 163* 127* 127* 104* 107*   Recent Labs  Lab 11/05/23 2329 11/05/23 2334 11/06/23 1247 11/07/23 0651  NA 138 141 139 139  K 3.9 3.8 3.4* 3.8  CL 104 103 104 107  CO2 25  --  23 20*  GLUCOSE 246* 222* 225* 120*  BUN 25* 29* 20 17  CREATININE 2.05* 2.20* 1.34* 1.15  CALCIUM 9.0  --  8.9 8.7*  MG  --   --  1.5*  --   PHOS  --   --  3.3 3.7   Recent Labs  Lab 11/05/23 2329 11/07/23 0651  AST 21  --   ALT 18  --   ALKPHOS 118  --   BILITOT 0.3  --   PROT 6.4*  --   ALBUMIN 3.5 3.0*   Recent Labs  Lab 11/05/23 2329 11/05/23 2334 11/06/23 1247  WBC 7.5  --  8.4  NEUTROABS 4.9  --   --   HGB 11.1* 12.6* 11.1*  HCT 35.6* 37.0* 35.0*  MCV 92.2  --  90.9  PLT 243  --  228   No results for input(s): "CKTOTAL", "CKMB", "CKMBINDEX", "TROPONINI" in the last 168 hours. Recent Labs    11/05/23 2329  LABPROT 13.5  INR 1.0   No results for input(s): "COLORURINE", "LABSPEC", "PHURINE", "GLUCOSEU", "HGBUR", "BILIRUBINUR", "KETONESUR", "PROTEINUR", "UROBILINOGEN", "NITRITE", "LEUKOCYTESUR" in the last 72 hours.  Invalid input(s): "APPERANCEUR"     Component Value Date/Time   CHOL 108 11/06/2023 1247   TRIG 76 11/06/2023 1247   TRIG 31 07/16/2006 1037   HDL 40 (L) 11/06/2023 1247   CHOLHDL 2.7 11/06/2023 1247   VLDL 15  11/06/2023 1247   LDLCALC 53 11/06/2023 1247   LDLCALC 74 07/17/2020 1025   Lab Results  Component Value Date   HGBA1C 8.8 (H) 11/06/2023      Component Value Date/Time   LABOPIA NONE DETECTED 10/20/2014 0220   COCAINSCRNUR NONE DETECTED 10/20/2014 0220   LABBENZ NONE DETECTED 10/20/2014 0220   AMPHETMU NONE DETECTED 10/20/2014 0220   THCU NONE DETECTED 10/20/2014 0220   LABBARB NONE DETECTED 10/20/2014 0220    Recent Labs  Lab 11/05/23 2329  ETH <10    I have personally reviewed the radiological images below and agree with the radiology interpretations.  ECHOCARDIOGRAM COMPLETE Result Date: 11/06/2023    ECHOCARDIOGRAM REPORT   Patient Name:   Scott Vasquez Date of Exam: 11/06/2023 Medical Rec #:  [id number]            Height:       69.0 in Accession #:    [id number]           Weight:       200.0 lb Date of Birth:  May 19, 1941            BSA:  2.066 m Patient Age:    82 years             BP:           109/54 mmHg Patient Gender: M                    HR:           61 bpm. Exam Location:  Inpatient Procedure: 2D Echo, Cardiac Doppler, Color Doppler and Saline Contrast Bubble            Study (Both Spectral and Color Flow Doppler were utilized during            procedure). Indications:    Stroke I63.9  History:        Patient has prior history of Echocardiogram examinations, most                 recent 03/15/2023.  Sonographer:    Harriette Bouillon RDCS Referring Phys: [id number] Dolly Rias IMPRESSIONS  1. The saline contrast study is poor quality. Please repeat or order TEE if there is a high level of suspicion for paradoxical embolism.  2. Left ventricular ejection fraction, by estimation, is 60 to 65%. The left ventricle has normal function. The left ventricle has no regional wall motion abnormalities. Left ventricular diastolic parameters are consistent with Grade I diastolic dysfunction (impaired relaxation).  3. Right ventricular systolic function is normal. The right  ventricular size is normal.  4. The mitral valve is normal in structure. No evidence of mitral valve regurgitation. No evidence of mitral stenosis. Moderate mitral annular calcification.  5. The aortic valve is tricuspid. There is mild calcification of the aortic valve. Aortic valve regurgitation is trivial. Aortic valve sclerosis/calcification is present, without any evidence of aortic stenosis.  6. The inferior vena cava is normal in size with greater than 50% respiratory variability, suggesting right atrial pressure of 3 mmHg. FINDINGS  Left Ventricle: Left ventricular ejection fraction, by estimation, is 60 to 65%. The left ventricle has normal function. The left ventricle has no regional wall motion abnormalities. The left ventricular internal cavity size was normal in size. There is  no left ventricular hypertrophy. Left ventricular diastolic parameters are consistent with Grade I diastolic dysfunction (impaired relaxation). Normal left ventricular filling pressure. Right Ventricle: The right ventricular size is normal. No increase in right ventricular wall thickness. Right ventricular systolic function is normal. Left Atrium: Left atrial size was normal in size. Right Atrium: Right atrial size was normal in size. Prominent Eustachian valve. Pericardium: There is no evidence of pericardial effusion. Mitral Valve: The mitral valve is normal in structure. Moderate mitral annular calcification. No evidence of mitral valve regurgitation. No evidence of mitral valve stenosis. Tricuspid Valve: The tricuspid valve is normal in structure. Tricuspid valve regurgitation is not demonstrated. No evidence of tricuspid stenosis. Aortic Valve: The aortic valve is tricuspid. There is mild calcification of the aortic valve. Aortic valve regurgitation is trivial. Aortic valve sclerosis/calcification is present, without any evidence of aortic stenosis. Pulmonic Valve: The pulmonic valve was normal in structure. Pulmonic valve  regurgitation is not visualized. No evidence of pulmonic stenosis. Aorta: The aortic root is normal in size and structure. Venous: The inferior vena cava is normal in size with greater than 50% respiratory variability, suggesting right atrial pressure of 3 mmHg. IAS/Shunts: The interatrial septum was not well visualized. Agitated saline contrast was given intravenously to evaluate for intracardiac shunting.  LEFT VENTRICLE PLAX 2D LVIDd:  4.50 cm   Diastology LVIDs:         3.20 cm   LV e' medial:    7.62 cm/s LV PW:         0.90 cm   LV E/e' medial:  10.8 LV IVS:        0.80 cm   LV e' lateral:   8.92 cm/s LVOT diam:     2.00 cm   LV E/e' lateral: 9.2 LV SV:         65 LV SV Index:   31 LVOT Area:     3.14 cm  RIGHT VENTRICLE RV S prime:     8.49 cm/s TAPSE (M-mode): 2.2 cm LEFT ATRIUM             Index        RIGHT ATRIUM           Index LA diam:        3.70 cm 1.79 cm/m   RA Area:     16.60 cm LA Vol (A2C):   39.3 ml 19.02 ml/m  RA Volume:   41.80 ml  20.23 ml/m LA Vol (A4C):   35.4 ml 17.13 ml/m LA Biplane Vol: 37.9 ml 18.34 ml/m  AORTIC VALVE LVOT Vmax:   83.00 cm/s LVOT Vmean:  57.200 cm/s LVOT VTI:    0.207 m  AORTA Ao Root diam: 3.20 cm MITRAL VALVE MV Area (PHT): 3.68 cm     SHUNTS MV Decel Time: 206 msec     Systemic VTI:  0.21 m MV E velocity: 82.30 cm/s   Systemic Diam: 2.00 cm MV A velocity: 114.00 cm/s MV E/A ratio:  0.72 Mihai Croitoru MD Electronically signed by Thurmon Fair MD Signature Date/Time: 11/06/2023/1:03:06 PM    Final    VAS US CAROTID Result Date: 11/06/2023 Carotid Arterial Duplex Study Patient Name:  Scott Vasquez  Date of Exam:   11/06/2023 Medical Rec #: [id number]             Accession #:    [id number] Date of Birth: 1941/02/16             Patient Gender: M Patient Age:   20 years Exam Location:  [facility] Procedure:      VAS US CAROTID Referring Phys: Scheryl Marten Stephane Niemann --------------------------------------------------------------------------------   Indications:       CVA and Abnormal eye movements and gait disturbance. Risk Factors:      Hypertension, hyperlipidemia, Diabetes, past history of                    smoking, prior CVA. Other Factors:     Dementia, CKD III. Limitations        Today's exam was limited due to the body habitus of the                    patient, the high bifurcation of the carotid and tense neck. Comparison Study:  Prior normal carotid duplex done 07/16/17. Normal CTA of the                    neck done 03/15/2023 Performing Technologist: Sherren Kerns RVS  Examination Guidelines: A complete evaluation includes B-mode imaging, spectral Doppler, color Doppler, and power Doppler as needed of all accessible portions of each vessel. Bilateral testing is considered an integral part of a complete examination. Limited examinations for reoccurring indications may be performed as noted.  Right Carotid Findings: +----------+--------+--------+--------+------------------+------------------+  PSV cm/sEDV cm/sStenosisPlaque DescriptionComments           +----------+--------+--------+--------+------------------+------------------+ CCA Prox  68      10                                intimal thickening +----------+--------+--------+--------+------------------+------------------+ CCA Distal74      11                                intimal thickening +----------+--------+--------+--------+------------------+------------------+ ICA Prox  55      13              heterogenous                         +----------+--------+--------+--------+------------------+------------------+ ICA Mid   49      14                                tortuous           +----------+--------+--------+--------+------------------+------------------+ ICA Distal48      15                                tortuous           +----------+--------+--------+--------+------------------+------------------+ ECA       66      12                                                    +----------+--------+--------+--------+------------------+------------------+ +----------+--------+-------+--------------+-------------------+           PSV cm/sEDV cmsDescribe      Arm Pressure (mmHG) +----------+--------+-------+--------------+-------------------+ Subclavian               Not identified                    +----------+--------+-------+--------------+-------------------+ +---------+--------+--+--------+-+ VertebralPSV cm/s31EDV cm/s6 +---------+--------+--+--------+-+  Left Carotid Findings: +----------+--------+--------+--------+------------------+------------------+           PSV cm/sEDV cm/sStenosisPlaque DescriptionComments           +----------+--------+--------+--------+------------------+------------------+ CCA Prox  59      15                                intimal thickening +----------+--------+--------+--------+------------------+------------------+ CCA Distal61      6                                 intimal thickening +----------+--------+--------+--------+------------------+------------------+ ICA Prox  52      13              heterogenous                         +----------+--------+--------+--------+------------------+------------------+ ICA Mid   46      8                                 tortuous           +----------+--------+--------+--------+------------------+------------------+  ICA Distal49      11                                tortuous           +----------+--------+--------+--------+------------------+------------------+ ECA       52      11                                                   +----------+--------+--------+--------+------------------+------------------+ +----------+--------+--------+--------------+-------------------+           PSV cm/sEDV cm/sDescribe      Arm Pressure (mmHG) +----------+--------+--------+--------------+-------------------+ Subclavian                 Not identified                    +----------+--------+--------+--------------+-------------------+ +---------+--------+--+--------+--+ VertebralPSV cm/s38EDV cm/s11 +---------+--------+--+--------+--+   Summary: Right Carotid: The extracranial vessels were near-normal with only minimal wall                thickening or plaque. Left Carotid: The extracranial vessels were near-normal with only minimal wall               thickening or plaque. Vertebrals:  Bilateral vertebral arteries demonstrate antegrade flow. Subclavians: Not assessed. *See table(s) above for measurements and observations.     Preliminary    MR BRAIN WO CONTRAST Result Date: 11/06/2023 CLINICAL DATA:  Neuro deficit, acute, stroke suspected EXAM: MRI HEAD WITHOUT CONTRAST MRA HEAD WITHOUT CONTRAST TECHNIQUE: Multiplanar, multi-echo pulse sequences of the brain and surrounding structures were acquired without intravenous contrast. Angiographic images of the Circle of Willis were acquired using MRA technique without intravenous contrast. COMPARISON:  None Available. FINDINGS: MRI HEAD FINDINGS Brain: Small acute infarct in the right paramidline midbrain. Slight edema without mass effect. No evidence of acute hemorrhage. Many foci of susceptibility artifact predominantly in the brainstem, basal ganglia and thalami, compatible with chronic microhemorrhages. Moderate T2/FLAIR hyperintensities the white matter, compatible with chronic microvascular ischemic disease. Remote lacunar infarcts in the corona radiata bilaterally. No mass lesion, midline shift or hydrocephalus Vascular: See below. Skull and upper cervical spine: Normal marrow signal. Sinuses/Orbits: No acute or significant finding. Other: No mastoid effusions. MRA HEAD FINDINGS Anterior circulation: Bilateral intracranial ICAs, MCAs, and ACAs are patent without proximal hemodynamically significant stenosis. Azygos ACA, anatomic variant. Posterior circulation: Bilateral  intradural vertebral arteries, basilar artery and bilateral posterior cerebral arteries are patent. Advanced right P2 PCA stenosis. Anatomic variants: Detailed above. IMPRESSION: MRI: 1. Small acute infarct in the right paramidline midbrain. 2. Many chronic microhemorrhages, likely due to chronic hypertension. 3. Moderate chronic microvascular ischemic disease. MRA: 1. No emergent large vessel occlusion. 2. Advanced right P2 PCA stenosis. Electronically Signed   By: Feliberto Harts M.D.   On: 11/06/2023 02:31   MR ANGIO HEAD WO CONTRAST Result Date: 11/06/2023 CLINICAL DATA:  Neuro deficit, acute, stroke suspected EXAM: MRI HEAD WITHOUT CONTRAST MRA HEAD WITHOUT CONTRAST TECHNIQUE: Multiplanar, multi-echo pulse sequences of the brain and surrounding structures were acquired without intravenous contrast. Angiographic images of the Circle of Willis were acquired using MRA technique without intravenous contrast. COMPARISON:  None Available. FINDINGS: MRI HEAD FINDINGS Brain: Small acute infarct in the right paramidline midbrain. Slight edema without mass effect. No evidence of acute  hemorrhage. Many foci of susceptibility artifact predominantly in the brainstem, basal ganglia and thalami, compatible with chronic microhemorrhages. Moderate T2/FLAIR hyperintensities the white matter, compatible with chronic microvascular ischemic disease. Remote lacunar infarcts in the corona radiata bilaterally. No mass lesion, midline shift or hydrocephalus Vascular: See below. Skull and upper cervical spine: Normal marrow signal. Sinuses/Orbits: No acute or significant finding. Other: No mastoid effusions. MRA HEAD FINDINGS Anterior circulation: Bilateral intracranial ICAs, MCAs, and ACAs are patent without proximal hemodynamically significant stenosis. Azygos ACA, anatomic variant. Posterior circulation: Bilateral intradural vertebral arteries, basilar artery and bilateral posterior cerebral arteries are patent. Advanced right  P2 PCA stenosis. Anatomic variants: Detailed above. IMPRESSION: MRI: 1. Small acute infarct in the right paramidline midbrain. 2. Many chronic microhemorrhages, likely due to chronic hypertension. 3. Moderate chronic microvascular ischemic disease. MRA: 1. No emergent large vessel occlusion. 2. Advanced right P2 PCA stenosis. Electronically Signed   By: Feliberto Harts M.D.   On: 11/06/2023 02:31   CT HEAD WO CONTRAST Result Date: 11/06/2023 CLINICAL DATA:  Ophthalmoplegia EXAM: CT HEAD WITHOUT CONTRAST TECHNIQUE: Contiguous axial images were obtained from the base of the skull through the vertex without intravenous contrast. RADIATION DOSE REDUCTION: This exam was performed according to the departmental dose-optimization program which includes automated exposure control, adjustment of the mA and/or kV according to patient size and/or use of iterative reconstruction technique. COMPARISON:  CT head March 14, 2023 FINDINGS: Brain: No evidence of acute infarction, hemorrhage, hydrocephalus, extra-axial collection or mass lesion/mass effect. Similar patchy white matter hypodensities, compatible with chronic microvascular ischemic disease. Similar remote lacunar infarcts involving the thalami and potentially basal ganglia. Vascular: Calcific atherosclerosis. No hyperdense vessel identified. Skull: No acute fracture. Sinuses/Orbits: Clear sinuses.  No acute orbital findings. Other: No mastoid effusions. IMPRESSION: 1. No evidence of acute intracranial abnormality.  Stable head CT. 2. Chronic microvascular ischemic disease. Electronically Signed   By: Feliberto Harts M.D.   On: 11/06/2023 00:37     PHYSICAL EXAM  Temp:  [97.3 F (36.3 C)-98.7 F (37.1 C)] 97.3 F (36.3 C) (03/30 1135) Pulse Rate:  [50-89] 89 (03/30 1135) Resp:  [16-18] 18 (03/30 1135) BP: (120-144)/(65-81) 120/72 (03/30 1135) SpO2:  [85 %-100 %] 85 % (03/30 1135)  General - Well nourished, well developed, in no apparent  distress.  Ophthalmologic - fundi not visualized due to noncooperation.  Cardiovascular - Regular rhythm and rate.  Neuro - awake, alert, eyes open, orientated to place, but not to age, time. No aphasia but positive speech, moderate psychomotor slowing and dysarthria, but following all simple commands. Able to name 3/4 and repeat simple sentences in dysarthric voice. Visual field full, but right eye INO, PERRL. No significant facial droop. Tongue midline. Bilateral UEs 5/5, no drift. Bilaterally LEs 4/5, no drift. Sensation symmetrical bilaterally subjectively, b/l FTN intact but slow, gait not tested.    ASSESSMENT/PLAN Scott Vasquez is a 83 y.o. male with history of dementia, hypertension, hyperlipidemia, diabetes, Bell's palsy, stroke admitted for diplopia and off-balance. No TNK given due to outside window.    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 Patient counseled to be  compliant with his antithrombotic medications Ongoing aggressive stroke risk factor management Therapy recommendations: CIR, however family and patient would like home health Disposition: Home today  History of stroke 03/2023 left CR small infarct, incidental finding, CT head and neck showed right P2 stenosis.  EF 55 to 60%.  LDL 55, A1c 9.4.  Discharged on DAPT and Lipitor 80.  Patient still has residual dysarthria.  Diabetes HgbA1c 8.8 goal < 7.0 Uncontrolled CBG monitoring SSI DM education and close PCP follow up  Hypertension Stable Long term BP goal normotensive  Hyperlipidemia Home meds: Lipitor 80 LDL 53, goal < 70 Now on  Lipitor 80 Continue statin at discharge  Other Stroke Risk Factors Advanced age  Other Active Problems Dementia, on home Aricept and Namenda Left Bell's palsy 03/2023  Hospital day # 1  Neurology will sign off. Please call with questions. Pt will follow up with [facility] NP at [facility] on 12/07/2023. Thanks for the consult.   Marvel Plan, MD PhD Stroke Neurology 11/07/2023 12:35 PM    To contact Stroke Continuity provider, please refer to [email or web address]. After hours, contact General Neurology

## 2023-11-07 NOTE — Progress Notes (Signed)
 RNCM received HH order for PT/OT/ST.  RNCM met with patient and patient's son at bedside and offered choice for [facility] services.  Per patient's son, they prefer not to use [facility] again.  No other choice/preference per patient's son so  [place] [facility] contacted with order and confirmation received.  Patient and son in agreement with services.

## 2023-11-07 NOTE — Discharge Summary (Signed)
 Physician Discharge Summary   Patient: Scott Vasquez MRN: [id number] DOB: [birth year]  Admit date:     11/05/2023  Discharge date: 11/07/23  Discharge Physician: Thad Ranger, MD    PCP: [name], NP   Recommendations at discharge:   Continue aspirin 81 mg daily, Plavix 75 mg daily for 21 days then Plavix alone Continue Lipitor 80 mg daily Patient has an established neurologist outpatient, follow-up in 2 to 3 weeks. Patient was recommended CIR however patient's family requested home health PT OT, speech therapy Please adjust antihypertensive dose according to BP readings at the follow-up  Discharge Diagnoses:    Acute ischemic stroke (HCC)   Hyperlipidemia   Essential hypertension   DM2 (diabetes mellitus, type 2) (HCC)   Mild dementia without behavioral disturbance, psychotic disturbance, mood disturbance, or anxiety (HCC)   CKD (chronic kidney disease), stage IIIb [facility]) Hypothyroidism  Hospital Course: Patient is a 83 year old male with dementia, diabetes mellitus type 2, prior CVA with residual slurred speech, hypertension, hyperlipidemia presented with visual disturbance.  Patient's son reported that patient was at his baseline when he saw him around 5 PM on the day of admission.  Subsequently 2 hours later patient appeared to have difficulty with his vision, abnormal movements in left eye and seemed off balance.   Patient presented to ED CT head showed no evidence of acute intracranial abnormality, chronic microvascular ischemic disease MRI brain showed small acute infarct in the right paramidline midbrain, many chronic micro hemorrhages, likely due to chronic hypertension, moderate chronic microvascular ischemic disease.   Neurology consulted, patient was admitted for further workup.   Assessment and Plan:  Acute ischemic stroke (HCC) with diplopia, right INO, gait instability - MRI brain showed small acute infarct in the right paramidline midbrain, many  chronic microhemorrhages,  moderate chronic microvascular ischemic disease. -Neurology consulted, suspected likely small vessel disease due to poorly controlled stroke risk factors including diabetes, hyperlipidemia. -MRA showed no emergent large vessel occlusion, advanced right P2 PCA stenosis -PT OT evaluation recommended CIR however patient's family (wife, son) requested home health PT, speech therapy, home health aide was also ordered.  Prior son, patient has not done well in the rehabs before. , -2D echo showed EF of 6065%, G1 DD -Carotid Dopplers showed no critically stenosed ICA -Seen by stroke service recommended aspirin and Plavix for 3 weeks and then Plavix alone, Lipitor 80 mg daily  Acute kidney injury on CKD (chronic kidney disease), stage IIIb -Baseline creatinine 1.4-1.7 -Present with creatinine of 2.0, trended up to 2.2 -Placed on gentle hydration, creatinine improved to 1.1, at baseline      Hyperlipidemia -Cholesterol 108, HDL 40, LDL 53, continue Lipitor     Essential hypertension -Per patient's son, his BP has been running low at home hence amlodipine decreased to 2.5 mg daily   Hypothyroidism -Continue Synthroid       DM2 (diabetes mellitus, type 2) (HCC) on control with hyperglycemia, NIDDM with CKD -On glipizide outpatient -Hemoglobin A1c 8.8 -Patienton sliding scale insulin while inpatient -Defer to PCP for adding another agent.  Fasting blood sugar this morning 120.       Mild dementia without behavioral disturbance(HCC) -Continue Aricept, Celexa     Estimated body mass index is 29.53 kg/m as calculated from the following:   Height as of this encounter: 5\' 9"  (1.753 m).   Weight as of this encounter: 90.7 kg.        Pain control - [employer] Controlled Substance Reporting System database was  reviewed. and patient was instructed, not to drive, operate heavy machinery, perform activities at heights, swimming or participation in water activities  or provide baby-sitting services while on Pain, Sleep and Anxiety Medications; until their outpatient Physician has advised to do so again. Also recommended to not to take more than prescribed Pain, Sleep and Anxiety Medications.  Consultants: Neurology Procedures performed: 2D echo, carotid Dopplers Disposition: Home Diet recommendation:  Discharge Diet Orders (From admission, onward)     Start     Ordered   11/07/23 0000  Diet Carb Modified        11/07/23 1112            DISCHARGE MEDICATION: Allergies as of 11/07/2023       Reactions   Actos [pioglitazone] Other (See Comments)   Diarrhea    Metformin And Related Other (See Comments)   Diarrhea         Medication List     TAKE these medications    Accu-Chek Aviva Plus test strip Generic drug: glucose blood TEST BLOOD SUGAR TWICE DAILY   Accu-Chek Guide w/Device Kit USE AS DIRECTED   amLODipine 2.5 MG tablet Commonly known as: NORVASC Take 1 tablet (2.5 mg total) by mouth daily. What changed:  medication strength how much to take   aspirin 81 MG chewable tablet Chew 1 tablet (81 mg total) by mouth daily for 21 days.   atorvastatin 80 MG tablet Commonly known as: LIPITOR TAKE 1 TABLET EVERY DAY   citalopram 20 MG tablet Commonly known as: CELEXA TAKE 1 TABLET EVERY DAY   clopidogrel 75 MG tablet Commonly known as: PLAVIX Take 1 tablet (75 mg total) by mouth daily. Start taking on: November 08, 2023   donepezil 10 MG tablet Commonly known as: ARICEPT TAKE 1 TABLET AT BEDTIME   glipiZIDE 10 MG 24 hr tablet Commonly known as: GLUCOTROL XL TAKE 1 TABLET EVERY DAY WITH BREAKFAST   levothyroxine 50 MCG tablet Commonly known as: SYNTHROID TAKE 1 TABLET EVERY DAY   memantine 10 MG tablet Commonly known as: NAMENDA TAKE 1 TABLET TWICE DAILY   OZEMPIC (0.25 OR 0.5 MG/DOSE) [place] Inject 0.25 mg into the skin once a week. Waiting to start once PAP product arrives        Follow-up Information      [name], NP. Schedule an appointment as soon as possible for a visit in 2 week(s).   Specialty: Family Medicine Why: for hospital follow-up Contact information: [address] [place] [address] [phone]         Marcos Eke, PA-C Follow up in 4 week(s).   Specialty: Neurology Why: for hospital follow-up, stroke Contact information: [address] [place] [address] [phone]                Discharge Exam: Ceasar Mons Weights   11/05/23 2258  Weight: 90.7 kg   S: Sitting up in the chair, pleasant, has residual dysarthria from previous stroke.  No acute issues overnight.  BP 120/72 (BP Location: Left Arm)   Pulse 89   Temp (!) 97.3 F (36.3 C) (Oral)   Resp 18   Ht 5\' 9"  (1.753 m)   Wt 90.7 kg   SpO2 (!) 85%   BMI 29.53 kg/m   Physical Exam General: Alert and oriented x 2, self and place, NAD, residual dysarthria Cardiovascular: S1 S2 clear, RRR.  Respiratory: CTAB, no wheezing Gastrointestinal: Soft, nontender, nondistended, NBS Ext: no pedal edema bilaterally  Neuro: no new deficits today Psych: Normal affect    Condition at discharge: fair  The results of significant diagnostics from this hospitalization (including imaging, microbiology, ancillary and laboratory) are listed below for reference.   Imaging Studies: ECHOCARDIOGRAM COMPLETE Result Date: 11/06/2023    ECHOCARDIOGRAM REPORT   Patient Name:   Jaymar JEFFERSON Barros Date of Exam: 11/06/2023 Medical Rec #:  [id number]            Height:       69.0 in Accession #:    [id number]           Weight:       200.0 lb Date of Birth:  [birth year]            BSA:          2.066 m Patient Age:    82 years             BP:           109/54 mmHg Patient Gender: M                    HR:           61 bpm. Exam Location:  Inpatient Procedure: 2D Echo, Cardiac Doppler, Color Doppler and Saline Contrast Bubble            Study (Both Spectral and Color Flow Doppler were utilized during             procedure). Indications:    Stroke I63.9  History:        Patient has prior history of Echocardiogram examinations, most                 recent 03/15/2023.  Sonographer:    Harriette Bouillon RDCS Referring Phys: [id number] Dolly Rias IMPRESSIONS  1. The saline contrast study is poor quality. Please repeat or order TEE if there is a high level of suspicion for paradoxical embolism.  2. Left ventricular ejection fraction, by estimation, is 60 to 65%. The left ventricle has normal function. The left ventricle has no regional wall motion abnormalities. Left ventricular diastolic parameters are consistent with Grade I diastolic dysfunction (impaired relaxation).  3. Right ventricular systolic function is normal. The right ventricular size is normal.  4. The mitral valve is normal in structure. No evidence of mitral valve regurgitation. No evidence of mitral stenosis. Moderate mitral annular calcification.  5. The aortic valve is tricuspid. There is mild calcification of the aortic valve. Aortic valve regurgitation is trivial. Aortic valve sclerosis/calcification is present, without any evidence of aortic stenosis.  6. The inferior vena cava is normal in size with greater than 50% respiratory variability, suggesting right atrial pressure of 3 mmHg. FINDINGS  Left Ventricle: Left ventricular ejection fraction, by estimation, is 60 to 65%. The left ventricle has normal function. The left ventricle has no regional wall motion abnormalities. The left ventricular internal cavity size was normal in size. There is  no left ventricular hypertrophy. Left ventricular diastolic parameters are consistent with Grade I diastolic dysfunction (impaired relaxation). Normal left ventricular filling pressure. Right Ventricle: The right ventricular size is normal. No increase in right ventricular wall thickness. Right ventricular systolic function is normal. Left Atrium: Left atrial size was normal in size. Right Atrium: Right atrial  size was normal in size. Prominent Eustachian valve. Pericardium: There is no evidence of pericardial effusion. Mitral Valve: The mitral valve is normal in structure. Moderate mitral annular calcification. No evidence of mitral  valve regurgitation. No evidence of mitral valve stenosis. Tricuspid Valve: The tricuspid valve is normal in structure. Tricuspid valve regurgitation is not demonstrated. No evidence of tricuspid stenosis. Aortic Valve: The aortic valve is tricuspid. There is mild calcification of the aortic valve. Aortic valve regurgitation is trivial. Aortic valve sclerosis/calcification is present, without any evidence of aortic stenosis. Pulmonic Valve: The pulmonic valve was normal in structure. Pulmonic valve regurgitation is not visualized. No evidence of pulmonic stenosis. Aorta: The aortic root is normal in size and structure. Venous: The inferior vena cava is normal in size with greater than 50% respiratory variability, suggesting right atrial pressure of 3 mmHg. IAS/Shunts: The interatrial septum was not well visualized. Agitated saline contrast was given intravenously to evaluate for intracardiac shunting.  LEFT VENTRICLE PLAX 2D LVIDd:         4.50 cm   Diastology LVIDs:         3.20 cm   LV e' medial:    7.62 cm/s LV PW:         0.90 cm   LV E/e' medial:  10.8 LV IVS:        0.80 cm   LV e' lateral:   8.92 cm/s LVOT diam:     2.00 cm   LV E/e' lateral: 9.2 LV SV:         65 LV SV Index:   31 LVOT Area:     3.14 cm  RIGHT VENTRICLE RV S prime:     8.49 cm/s TAPSE (M-mode): 2.2 cm LEFT ATRIUM             Index        RIGHT ATRIUM           Index LA diam:        3.70 cm 1.79 cm/m   RA Area:     16.60 cm LA Vol (A2C):   39.3 ml 19.02 ml/m  RA Volume:   41.80 ml  20.23 ml/m LA Vol (A4C):   35.4 ml 17.13 ml/m LA Biplane Vol: 37.9 ml 18.34 ml/m  AORTIC VALVE LVOT Vmax:   83.00 cm/s LVOT Vmean:  57.200 cm/s LVOT VTI:    0.207 m  AORTA Ao Root diam: 3.20 cm MITRAL VALVE MV Area (PHT): 3.68 cm      SHUNTS MV Decel Time: 206 msec     Systemic VTI:  0.21 m MV E velocity: 82.30 cm/s   Systemic Diam: 2.00 cm MV A velocity: 114.00 cm/s MV E/A ratio:  0.72 Mihai Croitoru MD Electronically signed by Thurmon Fair MD Signature Date/Time: 11/06/2023/1:03:06 PM    Final    VAS US CAROTID Result Date: 11/06/2023 Carotid Arterial Duplex Study Patient Name:  SKIPPY MARHEFKA Mayse  Date of Exam:   11/06/2023 Medical Rec #: [id number]             Accession #:    [id number] Date of Birth: 10-01-1940             Patient Gender: M Patient Age:   28 years Exam Location:  [facility] Procedure:      VAS US CAROTID Referring Phys: Scheryl Marten XU --------------------------------------------------------------------------------  Indications:       CVA and Abnormal eye movements and gait disturbance. Risk Factors:      Hypertension, hyperlipidemia, Diabetes, past history of                    smoking, prior CVA. Other Factors:  Dementia, CKD III. Limitations        Today's exam was limited due to the body habitus of the                    patient, the high bifurcation of the carotid and tense neck. Comparison Study:  Prior normal carotid duplex done 07/16/17. Normal CTA of the                    neck done 03/15/2023 Performing Technologist: Sherren Kerns RVS  Examination Guidelines: A complete evaluation includes B-mode imaging, spectral Doppler, color Doppler, and power Doppler as needed of all accessible portions of each vessel. Bilateral testing is considered an integral part of a complete examination. Limited examinations for reoccurring indications may be performed as noted.  Right Carotid Findings: +----------+--------+--------+--------+------------------+------------------+           PSV cm/sEDV cm/sStenosisPlaque DescriptionComments           +----------+--------+--------+--------+------------------+------------------+ CCA Prox  68      10                                intimal thickening  +----------+--------+--------+--------+------------------+------------------+ CCA Distal74      11                                intimal thickening +----------+--------+--------+--------+------------------+------------------+ ICA Prox  55      13              heterogenous                         +----------+--------+--------+--------+------------------+------------------+ ICA Mid   49      14                                tortuous           +----------+--------+--------+--------+------------------+------------------+ ICA Distal48      15                                tortuous           +----------+--------+--------+--------+------------------+------------------+ ECA       66      12                                                   +----------+--------+--------+--------+------------------+------------------+ +----------+--------+-------+--------------+-------------------+           PSV cm/sEDV cmsDescribe      Arm Pressure (mmHG) +----------+--------+-------+--------------+-------------------+ Subclavian               Not identified                    +----------+--------+-------+--------------+-------------------+ +---------+--------+--+--------+-+ VertebralPSV cm/s31EDV cm/s6 +---------+--------+--+--------+-+  Left Carotid Findings: +----------+--------+--------+--------+------------------+------------------+           PSV cm/sEDV cm/sStenosisPlaque DescriptionComments           +----------+--------+--------+--------+------------------+------------------+ CCA Prox  59      15  intimal thickening +----------+--------+--------+--------+------------------+------------------+ CCA Distal61      6                                 intimal thickening +----------+--------+--------+--------+------------------+------------------+ ICA Prox  52      13              heterogenous                          +----------+--------+--------+--------+------------------+------------------+ ICA Mid   46      8                                 tortuous           +----------+--------+--------+--------+------------------+------------------+ ICA Distal49      11                                tortuous           +----------+--------+--------+--------+------------------+------------------+ ECA       52      11                                                   +----------+--------+--------+--------+------------------+------------------+ +----------+--------+--------+--------------+-------------------+           PSV cm/sEDV cm/sDescribe      Arm Pressure (mmHG) +----------+--------+--------+--------------+-------------------+ Subclavian                Not identified                    +----------+--------+--------+--------------+-------------------+ +---------+--------+--+--------+--+ VertebralPSV cm/s38EDV cm/s11 +---------+--------+--+--------+--+   Summary: Right Carotid: The extracranial vessels were near-normal with only minimal wall                thickening or plaque. Left Carotid: The extracranial vessels were near-normal with only minimal wall               thickening or plaque. Vertebrals:  Bilateral vertebral arteries demonstrate antegrade flow. Subclavians: Not assessed. *See table(s) above for measurements and observations.     Preliminary    MR BRAIN WO CONTRAST Result Date: 11/06/2023 CLINICAL DATA:  Neuro deficit, acute, stroke suspected EXAM: MRI HEAD WITHOUT CONTRAST MRA HEAD WITHOUT CONTRAST TECHNIQUE: Multiplanar, multi-echo pulse sequences of the brain and surrounding structures were acquired without intravenous contrast. Angiographic images of the Circle of Willis were acquired using MRA technique without intravenous contrast. COMPARISON:  None Available. FINDINGS: MRI HEAD FINDINGS Brain: Small acute infarct in the right paramidline midbrain. Slight edema without mass  effect. No evidence of acute hemorrhage. Many foci of susceptibility artifact predominantly in the brainstem, basal ganglia and thalami, compatible with chronic microhemorrhages. Moderate T2/FLAIR hyperintensities the white matter, compatible with chronic microvascular ischemic disease. Remote lacunar infarcts in the corona radiata bilaterally. No mass lesion, midline shift or hydrocephalus Vascular: See below. Skull and upper cervical spine: Normal marrow signal. Sinuses/Orbits: No acute or significant finding. Other: No mastoid effusions. MRA HEAD FINDINGS Anterior circulation: Bilateral intracranial ICAs, MCAs, and ACAs are patent without proximal hemodynamically significant stenosis. Azygos ACA, anatomic variant. Posterior circulation: Bilateral intradural vertebral arteries, basilar artery and bilateral posterior  cerebral arteries are patent. Advanced right P2 PCA stenosis. Anatomic variants: Detailed above. IMPRESSION: MRI: 1. Small acute infarct in the right paramidline midbrain. 2. Many chronic microhemorrhages, likely due to chronic hypertension. 3. Moderate chronic microvascular ischemic disease. MRA: 1. No emergent large vessel occlusion. 2. Advanced right P2 PCA stenosis. Electronically Signed   By: Feliberto Harts M.D.   On: 11/06/2023 02:31   MR ANGIO HEAD WO CONTRAST Result Date: 11/06/2023 CLINICAL DATA:  Neuro deficit, acute, stroke suspected EXAM: MRI HEAD WITHOUT CONTRAST MRA HEAD WITHOUT CONTRAST TECHNIQUE: Multiplanar, multi-echo pulse sequences of the brain and surrounding structures were acquired without intravenous contrast. Angiographic images of the Circle of Willis were acquired using MRA technique without intravenous contrast. COMPARISON:  None Available. FINDINGS: MRI HEAD FINDINGS Brain: Small acute infarct in the right paramidline midbrain. Slight edema without mass effect. No evidence of acute hemorrhage. Many foci of susceptibility artifact predominantly in the brainstem, basal  ganglia and thalami, compatible with chronic microhemorrhages. Moderate T2/FLAIR hyperintensities the white matter, compatible with chronic microvascular ischemic disease. Remote lacunar infarcts in the corona radiata bilaterally. No mass lesion, midline shift or hydrocephalus Vascular: See below. Skull and upper cervical spine: Normal marrow signal. Sinuses/Orbits: No acute or significant finding. Other: No mastoid effusions. MRA HEAD FINDINGS Anterior circulation: Bilateral intracranial ICAs, MCAs, and ACAs are patent without proximal hemodynamically significant stenosis. Azygos ACA, anatomic variant. Posterior circulation: Bilateral intradural vertebral arteries, basilar artery and bilateral posterior cerebral arteries are patent. Advanced right P2 PCA stenosis. Anatomic variants: Detailed above. IMPRESSION: MRI: 1. Small acute infarct in the right paramidline midbrain. 2. Many chronic microhemorrhages, likely due to chronic hypertension. 3. Moderate chronic microvascular ischemic disease. MRA: 1. No emergent large vessel occlusion. 2. Advanced right P2 PCA stenosis. Electronically Signed   By: Feliberto Harts M.D.   On: 11/06/2023 02:31   CT HEAD WO CONTRAST Result Date: 11/06/2023 CLINICAL DATA:  Ophthalmoplegia EXAM: CT HEAD WITHOUT CONTRAST TECHNIQUE: Contiguous axial images were obtained from the base of the skull through the vertex without intravenous contrast. RADIATION DOSE REDUCTION: This exam was performed according to the departmental dose-optimization program which includes automated exposure control, adjustment of the mA and/or kV according to patient size and/or use of iterative reconstruction technique. COMPARISON:  CT head March 14, 2023 FINDINGS: Brain: No evidence of acute infarction, hemorrhage, hydrocephalus, extra-axial collection or mass lesion/mass effect. Similar patchy white matter hypodensities, compatible with chronic microvascular ischemic disease. Similar remote lacunar infarcts  involving the thalami and potentially basal ganglia. Vascular: Calcific atherosclerosis. No hyperdense vessel identified. Skull: No acute fracture. Sinuses/Orbits: Clear sinuses.  No acute orbital findings. Other: No mastoid effusions. IMPRESSION: 1. No evidence of acute intracranial abnormality.  Stable head CT. 2. Chronic microvascular ischemic disease. Electronically Signed   By: Feliberto Harts M.D.   On: 11/06/2023 00:37    Microbiology: Results for orders placed or performed in visit on 10/01/23  Urine Culture     Status: None   Collection Time: 10/01/23  4:49 PM  Result Value Ref Range Status   MICRO NUMBER: [id number]  Final   SPECIMEN QUALITY: Adequate  Final   Sample Source URINE  Final   STATUS: FINAL  Final   Result: No Growth  Final    Labs: CBC: Recent Labs  Lab 11/05/23 2329 11/05/23 2334 11/06/23 1247  WBC 7.5  --  8.4  NEUTROABS 4.9  --   --   HGB 11.1* 12.6* 11.1*  HCT 35.6* 37.0* 35.0*  MCV 92.2  --  90.9  PLT 243  --  228   Basic Metabolic Panel: Recent Labs  Lab 11/05/23 2329 11/05/23 2334 11/06/23 1247 11/07/23 0651  NA 138 141 139 139  K 3.9 3.8 3.4* 3.8  CL 104 103 104 107  CO2 25  --  23 20*  GLUCOSE 246* 222* 225* 120*  BUN 25* 29* 20 17  CREATININE 2.05* 2.20* 1.34* 1.15  CALCIUM 9.0  --  8.9 8.7*  MG  --   --  1.5*  --   PHOS  --   --  3.3 3.7   Liver Function Tests: Recent Labs  Lab 11/05/23 2329 11/07/23 0651  AST 21  --   ALT 18  --   ALKPHOS 118  --   BILITOT 0.3  --   PROT 6.4*  --   ALBUMIN 3.5 3.0*   CBG: Recent Labs  Lab 11/06/23 1350 11/06/23 1714 11/06/23 2103 11/07/23 0621 11/07/23 1107  GLUCAP 163* 127* 127* 104* 107*    Discharge time spent: greater than 30 minutes.  Signed: Thad Ranger, MD [facility] 11/07/2023

## 2023-11-07 NOTE — Evaluation (Signed)
 Physical Therapy Evaluation Patient Details Name: Scott Vasquez MRN: [id number] DOB: 28-Aug-1940 Today's Date: 11/07/2023  History of Present Illness  Pt is an 83 y.o. male presenting 3/28 with abnormal eye movement in L eye and decreased balance. 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 PMH significant for dementia, CVA, HTN, HLD, DM, CKD III, hypothyroidism.   Clinical Impression  [name] upon arrival of PT, agreeable to evaluation at this time. Prior to admission the pt was mobilizing with use of cane, living with his wife and daughter in a home with 3 steps to enter. The pt now presents with impaired coordination in LLE, poor spatial awareness, dynamic stability, and safety awareness. He needed up to modA to complete sit-stand transfer with cues for hand placement and assist to steady both himself and the RW with stand. With gait, the pt progressively made more errors as he fatigued, running into walls and objects along L side of hallway despite cues, needing assist and cues to regain balance and adjust RW. Per discussion with the pt's son, this is more assistance than his wife is able to safely provide. Given the pt's prior level of independence and need for up to modA with more restrictive DME at this time, recommend intensive rehab >3hours/day prior to return home to maximize independence and safety with mobility in the home.   If family would like for pt to return home, will need constant supervision and physical assist with all OOB mobility. Would then recommend max follow up therapies in home setting.     If plan is discharge home, recommend the following: A lot of help with walking and/or transfers;A lot of help with bathing/dressing/bathroom;Assistance with cooking/housework;Direct supervision/assist for medications management;Direct supervision/assist for financial management;Assist for transportation;Help with stairs or ramp for entrance;Supervision due to  cognitive status   Can travel by private vehicle        Equipment Recommendations None recommended by PT  Recommendations for Other Services  Rehab consult    Functional Status Assessment Patient has had a recent decline in their functional status and demonstrates the ability to make significant improvements in function in a reasonable and predictable amount of time.     Precautions / Restrictions Precautions Precautions: Fall Recall of Precautions/Restrictions: Impaired Restrictions Weight Bearing Restrictions Per Provider Order: No      Mobility  Bed Mobility Overal bed mobility: Needs Assistance Bed Mobility: Supine to Sit, Sit to Supine     Supine to sit: Supervision Sit to supine: Supervision   General bed mobility comments: increased time    Transfers Overall transfer level: Needs assistance Equipment used: Rolling walker (2 wheels) Transfers: Sit to/from Stand Sit to Stand: Min assist, Mod assist           General transfer comment: modA initially, progressed to mina with cues for hand placement. PT assisting to steady RW as pt trying to pull on RW and pick it up    Ambulation/Gait Ambulation/Gait assistance: Contact guard assist, Min assist, Mod assist Gait Distance (Feet): 150 Feet Assistive device: Rolling walker (2 wheels) Gait Pattern/deviations: Step-through pattern, Step-to pattern, Decreased step length - right, Decreased stride length, Drifts right/left Gait velocity: decreased Gait velocity interpretation: <1.8 ft/sec, indicate of risk for recurrent falls   General Gait Details: pt with progressive L lateral drift and running into various walls and objects along L side of hallway despite cues. needing assist and cues to regain balance and adjust RW. decreased balance with fatigue despite  pt not verbalizing increased tomodA to steady as pt walked mroe quickly making more errors    Balance Overall balance assessment: Needs  assistance Sitting-balance support: No upper extremity supported Sitting balance-Leahy Scale: Good Sitting balance - Comments: able to reach outside BOS with CGA   Standing balance support: Bilateral upper extremity supported, During functional activity Standing balance-Leahy Scale: Poor Standing balance comment: dependent on BUE support, leaning to L and lateral drift to L with gait.             High level balance activites: Direction changes, Turns High Level Balance Comments: pt with difficulty following directions while walking, poor avoidance of obstacles             Pertinent Vitals/Pain Pain Assessment Pain Assessment: No/denies pain    Home Living Family/patient expects to be discharged to:: Private residence Living Arrangements: Spouse/significant other;Children Available Help at Discharge: Family;Available 24 hours/day Type of Home: House Home Access: Stairs to enter Entrance Stairs-Rails: Doctor, general practice of Steps: 2   Home Layout: Two level;Able to live on main level with bedroom/bathroom Home Equipment: Cane - single point;BSC/3in1;Rollator (4 wheels);Rolling Walker (2 wheels) Additional Comments: spouse is 15 and not able to physically assist, daughter there "sometimes"    Prior Function Prior Level of Function : Independent/Modified Independent             Mobility Comments: uses cane for mobility, no other falls ADLs Comments: Mod I for ADLs, family assist with the IADLs     Extremity/Trunk Assessment   Upper Extremity Assessment Upper Extremity Assessment: Defer to OT evaluation    Lower Extremity Assessment Lower Extremity Assessment: LLE deficits/detail LLE Deficits / Details: grossly 4/5 to MMT, impaired coordination LLE Sensation: WNL LLE Coordination: decreased fine motor;decreased gross motor    Cervical / Trunk Assessment Cervical / Trunk Assessment: Normal  Communication   Communication Communication:  Impaired Factors Affecting Communication: Difficulty expressing self;Reduced clarity of speech    Cognition Arousal: Alert Behavior During Therapy: Flat affect   PT - Cognitive impairments: History of cognitive impairments, Memory, Attention, Initiation, Problem solving, Safety/Judgement                       PT - Cognition Comments: pt oriented to person only, able to follow direct commands and recall that he is at hospital after re-orientation. poor insight to deficits, needing cues for problem solving navigating in hallway around obstacles and not verbalizing when fatigued Following commands: Impaired Following commands impaired: Follows one step commands with increased time, Follows multi-step commands inconsistently     Cueing Cueing Techniques: Verbal cues, Gestural cues, Tactile cues            Assessment/Plan    PT Assessment Patient needs continued PT services  PT Problem List Decreased activity tolerance;Decreased balance;Decreased mobility;Decreased coordination;Decreased cognition;Decreased knowledge of use of DME;Decreased safety awareness;Decreased knowledge of precautions       PT Treatment Interventions DME instruction;Gait training;Stair training;Functional mobility training;Therapeutic activities;Therapeutic exercise;Balance training;Neuromuscular re-education;Cognitive remediation;Patient/family education    PT Goals (Current goals can be found in the Care Plan section)  Acute Rehab PT Goals Patient Stated Goal: to return home with wife PT Goal Formulation: With patient Time For Goal Achievement: 11/22/23 Potential to Achieve Goals: Good    Frequency Min 3X/week        AM-PAC PT "6 Clicks" Mobility  Outcome Measure Help needed turning from your back to your side while in a flat bed without using bedrails?:  A Little Help needed moving from lying on your back to sitting on the side of a flat bed without using bedrails?: A Little Help needed  moving to and from a bed to a chair (including a wheelchair)?: A Little Help needed standing up from a chair using your arms (e.g., wheelchair or bedside chair)?: A Little Help needed to walk in hospital room?: A Lot Help needed climbing 3-5 steps with a railing? : Total 6 Click Score: 15    End of Session Equipment Utilized During Treatment: Gait belt Activity Tolerance: Patient tolerated treatment well Patient left: in chair;with call bell/phone within reach;with chair alarm set Nurse Communication: Mobility status PT Visit Diagnosis: Unsteadiness on feet (R26.81);Other abnormalities of gait and mobility (R26.89);Muscle weakness (generalized) (M62.81);Ataxic gait (R26.0);Hemiplegia and hemiparesis Hemiplegia - Right/Left: Left Hemiplegia - dominant/non-dominant: Non-dominant Hemiplegia - caused by: Cerebral infarction    Time: 0817-0850 PT Time Calculation (min) (ACUTE ONLY): 33 min   Charges:   PT Evaluation $PT Eval Moderate Complexity: 1 Mod PT Treatments $Therapeutic Activity: 8-22 mins PT General Charges $$ ACUTE PT VISIT: 1 Visit         Vickki Muff, PT, DPT   Acute Rehabilitation Department Office [phone] Secure Chat Communication Preferred  Ronnie Derby 11/07/2023, 9:17 AM

## 2023-11-07 NOTE — Plan of Care (Signed)
 Pt removed his external catheter twice, reorient pt not to remove and followed. Comfortably resting as of this time.  Problem: Ischemic Stroke/TIA Tissue Perfusion: Goal: Complications of ischemic stroke/TIA will be minimized Outcome: Progressing   Problem: Coping: Goal: Will verbalize positive feelings about self Outcome: Progressing   Problem: Self-Care: Goal: Ability to participate in self-care as condition permits will improve Outcome: Progressing   Problem: Nutrition: Goal: Risk of aspiration will decrease Outcome: Progressing   Problem: Nutritional: Goal: Maintenance of adequate nutrition will improve Outcome: Progressing   Problem: Skin Integrity: Goal: Risk for impaired skin integrity will decrease Outcome: Progressing   Problem: Activity: Goal: Risk for activity intolerance will decrease Outcome: Progressing   Problem: Safety: Goal: Ability to remain free from injury will improve Outcome: Progressing

## 2023-11-08 ENCOUNTER — Telehealth: Payer: Self-pay

## 2023-11-08 NOTE — Telephone Encounter (Signed)
 Copied from CRM [id number]. Topic: Appointments - Transfer of Care >> Nov 08, 2023 10:14 AM Denese Killings wrote: Pt is requesting to transfer FROM: [name] NP Pt is requesting to transfer TO: Dr. Betty Swaziland  Reason for requested transfer: son prefers a Doctor for dad at this time  It is the responsibility of the team the patient would like to transfer to (Dr. Sw[place]iland ) to reach out to the patient if for any reason this transfer is not acceptable.

## 2023-11-08 NOTE — Transitions of Care (Post Inpatient/ED Visit) (Signed)
 11/08/2023  Name: Scott Vasquez MRN: [id number] DOB: 1941-04-12  Today's TOC FU Call Status: Today's TOC FU Call Status:: Successful TOC FU Call Completed TOC FU Call Complete Date: 11/08/23 Patient's Name and Date of Birth confirmed.  Transition Care Management Follow-up Telephone Call Date of Discharge: 11/07/23 Discharge Facility: [facility]) Type of Discharge: Inpatient Admission Primary Inpatient Discharge Diagnosis:: Acute ischemic stroke How have you been since you were released from the hospital?: Better (spoke with wife, who states that patient is doing well. eating and sleeping well.) Any questions or concerns?: No  Items Reviewed: Did you receive and understand the discharge instructions provided?: Yes Medications obtained,verified, and reconciled?: Yes (Medications Reviewed) Any new allergies since your discharge?: No Dietary orders reviewed?: Yes Type of Diet Ordered:: carb modified. Do you have support at home?: Yes People in Home: spouse Name of Support/Comfort Primary Source: [name]  Medications Reviewed Today: Medications Reviewed Today     Reviewed by Earlie Server, RN (Registered Nurse) on 11/08/23 at 1015  Med List Status: <None>   Medication Order Taking? Sig Documenting Provider Last Dose Status Informant  ACCU-CHEK AVIVA PLUS test strip [id number] Yes TEST BLOOD SUGAR TWICE DAILY Nafziger, Kandee Keen, NP Taking Active Spouse/Significant Other  amLODipine (NORVASC) 2.5 MG tablet [id number] Yes Take 1 tablet (2.5 mg total) by mouth daily. Rai, Delene Ruffini, MD Taking Active   aspirin 81 MG chewable tablet [id number] Yes Chew 1 tablet (81 mg total) by mouth daily for 21 days. Cathren Harsh, MD Taking Active   atorvastatin (LIPITOR) 80 MG tablet [id number] Yes TAKE 1 TABLET EVERY DAY Nafziger, Kandee Keen, NP Taking Active Spouse/Significant Other  Blood Glucose Monitoring Suppl (ACCU-CHEK GUIDE) w/Device KIT [id number] Yes USE AS DIRECTED [name], NP Taking  Active Spouse/Significant Other  citalopram (CELEXA) 20 MG tablet [id number] Yes TAKE 1 TABLET EVERY DAY Marcos Eke, PA-C Taking Active Spouse/Significant Other  clopidogrel (PLAVIX) 75 MG tablet [id number] Yes Take 1 tablet (75 mg total) by mouth daily. Cathren Harsh, MD Taking Active   donepezil (ARICEPT) 10 MG tablet [id number] Yes TAKE 1 TABLET AT BEDTIME Marcos Eke, PA-C Taking Active Spouse/Significant Other  glipiZIDE (GLUCOTROL XL) 10 MG 24 hr tablet [id number] Yes TAKE 1 TABLET EVERY DAY WITH BREAKFAST Nafziger, Cory, NP Taking Active Spouse/Significant Other  levothyroxine (SYNTHROID) 50 MCG tablet [id number] Yes TAKE 1 TABLET EVERY DAY Nafziger, Cory, NP Taking Active Spouse/Significant Other  memantine (NAMENDA) 10 MG tablet [id number] Yes TAKE 1 TABLET TWICE DAILY Marcos Eke, PA-C Taking Active Spouse/Significant Other  Semaglutide (OZEMPIC, 0.25 OR 0.5 MG/DOSE, Friendly) [id number] Yes Inject 0.25 mg into the skin once a week. Waiting to start once PAP product arrives [provider] Taking Active Spouse/Significant Other           Med Note Leitha Bleak, Milas Kocher   Tue Nov 02, 2023 10:20 AM) Started 10/03/23, increasing to 0.5mg  on 3/30            Home Care and Equipment/Supplies: Were Home Health Services Ordered?: Yes Name of Home Health Agency:: [facility] Has Agency set up a time to come to your home?: No Any new equipment or medical supplies ordered?: No  Functional Questionnaire: Do you need assistance with bathing/showering or dressing?: Yes (wife) Do you need assistance with meal preparation?: Yes (wife) Do you need assistance with eating?: No Do you have difficulty maintaining continence: No Do you need assistance with getting out of bed/getting out of a chair/moving?: No Do you  have difficulty managing or taking your medications?: Yes (wife)  Follow up appointments reviewed: PCP Follow-up appointment confirmed?: Yes MD Provider Line Number:[phone]  Given: No Date of PCP follow-up appointment?: 11/10/23 Follow-up Provider: [facility] Follow-up appointment confirmed?: Yes Date of Specialist follow-up appointment?: 12/07/23 Follow-Up Specialty Provider:: Marlowe Kays Do you need transportation to your follow-up appointment?: No Do you understand care options if your condition(s) worsen?: Yes-patient verbalized understanding  SDOH Interventions Today    Flowsheet Row Most Recent Value  SDOH Interventions   Food Insecurity Interventions Intervention Not Indicated  Housing Interventions Intervention Not Indicated  Transportation Interventions Intervention Not Indicated, Other (Comment)  [Reviewed [employer] transportation]  Utilities Interventions Intervention Not Indicated      Interventions Today    Flowsheet Row Most Recent Value  Chronic Disease   Chronic disease during today's visit Diabetes, Other  [stroke]  General Interventions   General Interventions Discussed/Reviewed General Interventions Discussed, General Interventions Reviewed, Doctor Visits  Doctor Visits Discussed/Reviewed Doctor Visits Discussed, Doctor Visits Reviewed, PCP, Specialist  PCP/Specialist Visits Compliance with follow-up visit  Education Interventions   Education Provided Provided Education  [Reviewed importance of carb modified diet and monitoring of CBG levels.]  Provided Verbal Education On Nutrition, Labs, Blood Sugar Monitoring, When to see the doctor, Medication  Nutrition Interventions   Nutrition Discussed/Reviewed Nutrition Discussed, Carbohydrate meal planning  Pharmacy Interventions   Pharmacy Dicussed/Reviewed Medications and their functions  Safety Interventions   Safety Discussed/Reviewed Fall Risk, Home Safety      TOC Interventions Today    Flowsheet Row Most Recent Value  TOC Interventions   TOC Interventions Discussed/Reviewed TOC Interventions Discussed, TOC Interventions Reviewed  [Reviewed home health  company is [facility] and wife states she will call them tomorrow if she has not heard from them.  Wife concerns about patient wetting the bed. Reviewed incontience pads.]      Wife reports that patient is doing well. Reports son takes patient to appointments.  Reviewed with wife to call [employer] to get assistance with transportation if needed. Wife concern about incontinence at bed time. " Having to wash sheets every day". Reviewed use of incontinence pads for the bed. Wife to discuss with [employer]. Follow up appointments made. Reviewed and offered 30 day TOC program and wife declined.  Lonia Chimera, RN, BSN, CEN [employer]- Transition of Care Team.  [facility] [phone]

## 2023-11-10 ENCOUNTER — Encounter: Payer: Self-pay | Admitting: Adult Health

## 2023-11-10 ENCOUNTER — Ambulatory Visit (INDEPENDENT_AMBULATORY_CARE_PROVIDER_SITE_OTHER): Admitting: Adult Health

## 2023-11-10 VITALS — BP 130/70 | HR 84 | Temp 96.0°F

## 2023-11-10 DIAGNOSIS — E1169 Type 2 diabetes mellitus with other specified complication: Secondary | ICD-10-CM

## 2023-11-10 DIAGNOSIS — E785 Hyperlipidemia, unspecified: Secondary | ICD-10-CM

## 2023-11-10 DIAGNOSIS — I129 Hypertensive chronic kidney disease with stage 1 through stage 4 chronic kidney disease, or unspecified chronic kidney disease: Secondary | ICD-10-CM | POA: Diagnosis not present

## 2023-11-10 DIAGNOSIS — Z7985 Long-term (current) use of injectable non-insulin antidiabetic drugs: Secondary | ICD-10-CM

## 2023-11-10 DIAGNOSIS — Z09 Encounter for follow-up examination after completed treatment for conditions other than malignant neoplasm: Secondary | ICD-10-CM

## 2023-11-10 DIAGNOSIS — F03B0 Unspecified dementia, moderate, without behavioral disturbance, psychotic disturbance, mood disturbance, and anxiety: Secondary | ICD-10-CM | POA: Diagnosis not present

## 2023-11-10 DIAGNOSIS — Z7984 Long term (current) use of oral hypoglycemic drugs: Secondary | ICD-10-CM

## 2023-11-10 DIAGNOSIS — I10 Essential (primary) hypertension: Secondary | ICD-10-CM

## 2023-11-10 DIAGNOSIS — N183 Chronic kidney disease, stage 3 unspecified: Secondary | ICD-10-CM

## 2023-11-10 DIAGNOSIS — E1165 Type 2 diabetes mellitus with hyperglycemia: Secondary | ICD-10-CM

## 2023-11-10 DIAGNOSIS — N189 Chronic kidney disease, unspecified: Secondary | ICD-10-CM

## 2023-11-10 DIAGNOSIS — I6389 Other cerebral infarction: Secondary | ICD-10-CM

## 2023-11-10 DIAGNOSIS — N179 Acute kidney failure, unspecified: Secondary | ICD-10-CM

## 2023-11-10 DIAGNOSIS — Z8673 Personal history of transient ischemic attack (TIA), and cerebral infarction without residual deficits: Secondary | ICD-10-CM | POA: Diagnosis not present

## 2023-11-10 DIAGNOSIS — E1122 Type 2 diabetes mellitus with diabetic chronic kidney disease: Secondary | ICD-10-CM

## 2023-11-10 DIAGNOSIS — E039 Hypothyroidism, unspecified: Secondary | ICD-10-CM

## 2023-11-10 DIAGNOSIS — R2681 Unsteadiness on feet: Secondary | ICD-10-CM | POA: Diagnosis not present

## 2023-11-10 DIAGNOSIS — I639 Cerebral infarction, unspecified: Secondary | ICD-10-CM

## 2023-11-10 NOTE — Progress Notes (Signed)
 Subjective:    Patient ID: Scott Vasquez, male    DOB: 1941-02-08, 83 y.o.   MRN: [id number]  HPI 83 year old male who  has a past medical history of Anemia, Diabetes mellitus, Diverticulosis of colon, Hypertension, Stroke (HCC), and Thyroid nodule.  He presents with his family for TCM visit   Admit Date 11/05/2023 Discharge Date 11/07/2023  He presented to the emergency room with visual disturbance.  His son who took him to the emergency room reported that he last saw his dad at his baseline around 5 PM on the day of admission and when he saw him again 2 hours later he appeared to have difficulty with vision and abnormal movements in his left eye as well as seemed off balance.  In the ER his CT head showed no evidence of acute intracranial abnormality, chronic microvascular ischemic disease.  MRI of the brain showed small acute infarct in the right paramidline midbrain, many chronic microhemorrhages, likely due to chronic hypertension, moderate chronic microvascular ischemic disease.  Hospital Course  Acute ischemic stroke with diplopia, right INO, gait instability  -After findings of the MRI, neurology was consulted and suspected likely small of vessel disease due to poorly controlled stroke risk factors including diabetes and hyperlipidemia.  MRI showed no emergent large vessel occlusion, advanced right P2 PCA stenosis.  PT/OT evaluation commended CIR however patient's wife and son requested home health PT, speech therapy, and home health aide was also ordered. -2D echo showed EF of 60 to 65%, grade 1 diastolic dysfunction. 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   -On admission creatinine 2.0 and trended up to 2.2.  He was placed on gentle hydration and creatinine improved to 1.1 at  baseline  Hyperlipidemia  -Cholesterol 108, HDL 40, LDL 53  -Continued on Lipitor 80 mg  Essential Hypertension  -Patient's son reported that his blood pressure had been running low at home.  Amlodipine decreased to 2.5 mg daily BP Readings from Last 3 Encounters:  11/10/23 130/70  11/07/23 120/72  10/01/23 (!) 100/50   Hypothyroidism  - Continued with synthroid 50 mcg  DM type 2  -He is on glipizide 10 mg extended release daily -A1c in the hospital was 8.8.  This was an increase from 8.5 in December. Lab Results  Component Value Date   HGBA1C 8.8 (H) 11/06/2023   HGBA1C 8.5 (H) 07/15/2023   HGBA1C 9.1 (H) 04/01/2023  -He was on sliding scale insulin while inpatient.  Mild Dementia  - Continued on Aricept and Celexa  Today he presents to the office with his wife.  His wife provides most of the history due to the patient's dementia.  She reports that he has been doing well since being discharged from the hospital, eating, drinking, and sleeping good.  He has not had any residual neurological deficits and no new signs of CVA.  Continues to be ambulatory with a cane and has not had any recent falls but his wife feels as though he is unsteady when using his cane and although he has a walker at home he is not using it.  We discussed his diabetes, he is using Ozempic that he gets through PAP.  This is his first week on the 0.5 mg dose.  Has not had any side effects.    His wife  reports that he does have an appointment with neurology from Dec 21, 2023 but is hopeful that they can get him in sooner, he is on a cancellation list.    Review of Systems  Unable to perform ROS: Dementia     Past Medical History:  Diagnosis Date   Anemia    iron deficiency   Diabetes mellitus    type II   Diverticulosis of colon    Hypertension    Stroke (HCC)    numbness in left hand   Thyroid nodule     Social History   Socioeconomic History   Marital status: Married    Spouse name: Not  on file   Number of children: 2   Years of education: 8   Highest education level: Not on file  Occupational History   Occupation: retired    Associate Professor: [employer]  Tobacco Use   Smoking status: Former    Current packs/day: 0.00    Average packs/day: 0.5 packs/day for 20.0 years (10.0 ttl pk-yrs)    Types: Cigarettes    Start date: 28    Quit date: 08/10/1958    Years since quitting: 65.2   Smokeless tobacco: Never  Vaping Use   Vaping status: Never Used  Substance and Sexual Activity   Alcohol use: No   Drug use: No   Sexual activity: Not on file  Other Topics Concern   Not on file  Social History Narrative   Pt is R handed   Lives in single story home with his wife, Scott Vasquez   2 adult children   8th grade education   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[employer]   Married 56 years   Social Drivers of [employer]: Low Risk  (05/09/2021)   Overall Financial Resource Strain (CARDIA)    Difficulty of Paying Living Expenses: Not hard at all  Food Insecurity: No Food Insecurity (11/08/2023)   Hunger Vital Sign    Worried About Running Out of Food in the Last Year: Never true    Ran Out of Food in the Last Year: Never true  Transportation Needs: No Transportation Needs (11/08/2023)   PRAPARE - Administrator, Civil Service (Medical): No    Lack of Transportation (Non-Medical): No  Physical Activity: Not on file  Stress: Not on file  Social Connections: Socially Integrated (11/06/2023)   Social Connection and Isolation Panel [NHANES]    Frequency of Communication with Friends and Family: More than three times a week    Frequency of Social Gatherings with Friends and Family: More than three times a week    Attends Religious Services: More than 4 times per year    Active Member of [employer] or Organizations: Yes    Attends Banker Meetings: More than 4 times per year    Marital Status: Married  Catering manager Violence: Patient Unable To Answer  (11/08/2023)   Humiliation, Afraid, Rape, and Kick questionnaire    Fear of Current or Ex-Partner: Patient unable to answer    Emotionally Abused: Patient unable to answer    Physically Abused: Patient unable to answer    Sexually Abused: Patient unable to answer    Past Surgical History:  Procedure Laterality Date   BACK SURGERY     CATARACT EXTRACTION, BILATERAL     SHOULDER SURGERY  08/2007   Supple   THYROID LOBECTOMY Left 04/29/2015   THYROID LOBECTOMY Left 04/29/2015   Procedure: THYROID LOBECTOMY;  Surgeon: Darnell Level,  MD;  Location: [facility] OR;  Service: General;  Laterality: Left;    Family History  Problem Relation Age of Onset   Diabetes Other    Diabetes Father        Died from diabetic coma in 12/04/1970   Hypertension Father    Stroke Mother        Died in 56   Hypertension Mother     Allergies  Allergen Reactions   Actos [Pioglitazone] Other (See Comments)    Diarrhea    Metformin And Related Other (See Comments)    Diarrhea      Current Outpatient Medications on File Prior to Visit  Medication Sig Dispense Refill   ACCU-CHEK AVIVA PLUS test strip TEST BLOOD SUGAR TWICE DAILY 200 strip 3   amLODipine (NORVASC) 2.5 MG tablet Take 1 tablet (2.5 mg total) by mouth daily. 30 tablet 3   aspirin 81 MG chewable tablet Chew 1 tablet (81 mg total) by mouth daily for 21 days.     atorvastatin (LIPITOR) 80 MG tablet TAKE 1 TABLET EVERY DAY 90 tablet 3   Blood Glucose Monitoring Suppl (ACCU-CHEK GUIDE) w/Device KIT USE AS DIRECTED 1 kit 0   citalopram (CELEXA) 20 MG tablet TAKE 1 TABLET EVERY DAY 90 tablet 3   clopidogrel (PLAVIX) 75 MG tablet Take 1 tablet (75 mg total) by mouth daily. 30 tablet 3   donepezil (ARICEPT) 10 MG tablet TAKE 1 TABLET AT BEDTIME 90 tablet 3   glipiZIDE (GLUCOTROL XL) 10 MG 24 hr tablet TAKE 1 TABLET EVERY DAY WITH BREAKFAST 90 tablet 3   levothyroxine (SYNTHROID) 50 MCG tablet TAKE 1 TABLET EVERY DAY 90 tablet 3   memantine (NAMENDA) 10 MG  tablet TAKE 1 TABLET TWICE DAILY 180 tablet 3   Semaglutide (OZEMPIC, 0.25 OR 0.5 MG/DOSE, [place]) Inject 0.25 mg into the skin once a week. Waiting to start once PAP product arrives     No current facility-administered medications on file prior to visit.    BP 130/70   Pulse 84   Temp (!) 96 F (35.6 C)   SpO2 96%       Objective:   Physical Exam Constitutional:      Appearance: Normal appearance.  Cardiovascular:     Rate and Rhythm: Normal rate and regular rhythm.     Pulses: Normal pulses.     Heart sounds: Normal heart sounds.  Pulmonary:     Effort: Pulmonary effort is normal.     Breath sounds: Normal breath sounds.  Musculoskeletal:        General: Normal range of motion.  Skin:    General: Skin is warm and dry.  Neurological:     Mental Status: He is alert. Mental status is at baseline.     Motor: Weakness present.     Coordination: Coordination abnormal.     Gait: Gait abnormal.  Psychiatric:        Mood and Affect: Mood and affect normal.        Speech: Speech is delayed (able to answer simple questiosn with " yes" and " no". He is not able to carry on complex conversation).        Behavior: Behavior normal.        Cognition and Memory: Cognition is impaired. Memory is impaired. He exhibits impaired recent memory and impaired remote memory.       Assessment & Plan:  1. Acute ischemic stroke (HCC) (Primary) -Reviewed hospital notes, discharge instructions, labs, imaging, and  medication changes with the patient's wife.  All questions answered to the best of my ability.  Continue with aspirin and Plavix until 3-week mark and then stop aspirin and continue Plavix. -Follow-up with neurology as directed. -Encouraged follow-up with any neurological changes. - CBC; Future - Comprehensive metabolic panel with GFR; Future - Comprehensive metabolic panel with GFR - CBC  2. Acute kidney injury superimposed on CKD (HCC) - Stay hydrated.  - Will recheck kidney function  today  - CBC; Future - Comprehensive metabolic panel with GFR; Future - Comprehensive metabolic panel with GFR - CBC  3. Hyperlipidemia associated with type 2 diabetes mellitus (HCC) - Continue with statin and asa - CBC; Future - Comprehensive metabolic panel with GFR; Future - Comprehensive metabolic panel with GFR - CBC  4. Essential hypertension - BP at goal today with Norvasc 2.5 mg. No change in medication  - CBC; Future - Comprehensive metabolic panel with GFR; Future - Comprehensive metabolic panel with GFR - CBC  5. Acquired hypothyroidism - Continue with Synthroid  - CBC; Future - Comprehensive metabolic panel with GFR; Future - Comprehensive metabolic panel with GFR - CBC  6. Uncontrolled type 2 diabetes mellitus with hyperglycemia (HCC) - Continue with Glipizide and Ozempic - will continue to titrate - CBC; Future - Comprehensive metabolic panel with GFR; Future - Comprehensive metabolic panel with GFR - CBC  7. Moderate dementia without behavioral disturbance, psychotic disturbance, mood disturbance, or anxiety, unspecified dementia type (HCC) - Continue with Aricept and Celexa - Follow up with Neurology as directed  8. Gait instability - He is unsteady with cane, especially with changing positions.  - I would like him to start using his walker when ambulating   [name], NP

## 2023-11-10 NOTE — Patient Instructions (Signed)
 We will follow up with you regarding your blood work   Please stay hydrated.   We will keep your blood pressure medication at the current dose of 2.5 mg of Norvasc   Let me know if anything changes

## 2023-11-11 ENCOUNTER — Telehealth: Payer: Self-pay | Admitting: *Deleted

## 2023-11-11 DIAGNOSIS — K5730 Diverticulosis of large intestine without perforation or abscess without bleeding: Secondary | ICD-10-CM | POA: Diagnosis not present

## 2023-11-11 DIAGNOSIS — N1832 Chronic kidney disease, stage 3b: Secondary | ICD-10-CM | POA: Diagnosis not present

## 2023-11-11 DIAGNOSIS — H5120 Internuclear ophthalmoplegia, unspecified eye: Secondary | ICD-10-CM | POA: Diagnosis not present

## 2023-11-11 DIAGNOSIS — N179 Acute kidney failure, unspecified: Secondary | ICD-10-CM | POA: Diagnosis not present

## 2023-11-11 DIAGNOSIS — F02A0 Dementia in other diseases classified elsewhere, mild, without behavioral disturbance, psychotic disturbance, mood disturbance, and anxiety: Secondary | ICD-10-CM | POA: Diagnosis not present

## 2023-11-11 DIAGNOSIS — E039 Hypothyroidism, unspecified: Secondary | ICD-10-CM | POA: Diagnosis not present

## 2023-11-11 DIAGNOSIS — E1122 Type 2 diabetes mellitus with diabetic chronic kidney disease: Secondary | ICD-10-CM | POA: Diagnosis not present

## 2023-11-11 DIAGNOSIS — I129 Hypertensive chronic kidney disease with stage 1 through stage 4 chronic kidney disease, or unspecified chronic kidney disease: Secondary | ICD-10-CM | POA: Diagnosis not present

## 2023-11-11 DIAGNOSIS — D509 Iron deficiency anemia, unspecified: Secondary | ICD-10-CM | POA: Diagnosis not present

## 2023-11-11 LAB — CBC
HCT: 35.8 % — ABNORMAL LOW (ref 39.0–52.0)
Hemoglobin: 11.6 g/dL — ABNORMAL LOW (ref 13.0–17.0)
MCHC: 32.4 g/dL (ref 30.0–36.0)
MCV: 89.7 fl (ref 78.0–100.0)
Platelets: 264 10*3/uL (ref 150.0–400.0)
RBC: 3.99 Mil/uL — ABNORMAL LOW (ref 4.22–5.81)
RDW: 14.5 % (ref 11.5–15.5)
WBC: 8.3 10*3/uL (ref 4.0–10.5)

## 2023-11-11 LAB — COMPREHENSIVE METABOLIC PANEL WITH GFR
ALT: 18 U/L (ref 0–53)
AST: 19 U/L (ref 0–37)
Albumin: 4.2 g/dL (ref 3.5–5.2)
Alkaline Phosphatase: 143 U/L — ABNORMAL HIGH (ref 39–117)
BUN: 25 mg/dL — ABNORMAL HIGH (ref 6–23)
CO2: 29 meq/L (ref 19–32)
Calcium: 9.4 mg/dL (ref 8.4–10.5)
Chloride: 101 meq/L (ref 96–112)
Creatinine, Ser: 1.41 mg/dL (ref 0.40–1.50)
GFR: 45.77 mL/min — ABNORMAL LOW (ref >60.00)
Glucose, Bld: 277 mg/dL — ABNORMAL HIGH (ref 70–99)
Potassium: 3.7 meq/L (ref 3.5–5.1)
Sodium: 139 meq/L (ref 135–145)
Total Bilirubin: 0.3 mg/dL (ref 0.2–1.2)
Total Protein: 7 g/dL (ref 6.0–8.3)

## 2023-11-11 NOTE — Telephone Encounter (Signed)
 Left detailed message on machine for  Stacy the physical therapist With verbal orders for  Service Requested: Physical Therapy/Homehealth Services Frequency: once a week for 5 weeks, and home health aid 1 time a week for 8 weeks starting next week.

## 2023-11-11 NOTE — Telephone Encounter (Signed)
 Copied from CRM [id number]. Topic: Clinical - Home Health Verbal Orders >> Nov 11, 2023 12:32 PM Florestine Avers wrote: Caller/Agency: Stacy the physical therapist Callback Number: [phone] voice mail is confidential  Service Requested: Physical Therapy/Homehealth Services Frequency: once a week for 5 weeks, and home health aid 1 time a week for 8 weeks starting next week. Any new concerns about the patient? No

## 2023-11-17 ENCOUNTER — Telehealth: Payer: Self-pay

## 2023-11-17 DIAGNOSIS — N1832 Chronic kidney disease, stage 3b: Secondary | ICD-10-CM | POA: Diagnosis not present

## 2023-11-17 DIAGNOSIS — E039 Hypothyroidism, unspecified: Secondary | ICD-10-CM | POA: Diagnosis not present

## 2023-11-17 DIAGNOSIS — D509 Iron deficiency anemia, unspecified: Secondary | ICD-10-CM | POA: Diagnosis not present

## 2023-11-17 DIAGNOSIS — N179 Acute kidney failure, unspecified: Secondary | ICD-10-CM | POA: Diagnosis not present

## 2023-11-17 DIAGNOSIS — K5730 Diverticulosis of large intestine without perforation or abscess without bleeding: Secondary | ICD-10-CM | POA: Diagnosis not present

## 2023-11-17 DIAGNOSIS — H5120 Internuclear ophthalmoplegia, unspecified eye: Secondary | ICD-10-CM | POA: Diagnosis not present

## 2023-11-17 DIAGNOSIS — F02A0 Dementia in other diseases classified elsewhere, mild, without behavioral disturbance, psychotic disturbance, mood disturbance, and anxiety: Secondary | ICD-10-CM | POA: Diagnosis not present

## 2023-11-17 DIAGNOSIS — E1122 Type 2 diabetes mellitus with diabetic chronic kidney disease: Secondary | ICD-10-CM | POA: Diagnosis not present

## 2023-11-17 DIAGNOSIS — I129 Hypertensive chronic kidney disease with stage 1 through stage 4 chronic kidney disease, or unspecified chronic kidney disease: Secondary | ICD-10-CM | POA: Diagnosis not present

## 2023-11-17 NOTE — Telephone Encounter (Signed)
 Copied from CRM [id number]. Topic: Clinical - Home Health Verbal Orders >> Nov 17, 2023 11:59 AM Efraim Kaufmann C wrote: Caller/Agency: [facility] Callback Number: [phone] Service Requested: Speech Therapy Frequency: 1 time a week for 6 weeks  Any new concerns about the patient? No

## 2023-11-17 NOTE — Telephone Encounter (Signed)
 Called [facility] and no answer. Unable to leave vm due to it being full. Will try again tomorrow.

## 2023-11-17 NOTE — Telephone Encounter (Signed)
Okay for verbal orders? Please advise 

## 2023-11-18 NOTE — Telephone Encounter (Signed)
 Left message to return phone call.

## 2023-11-19 NOTE — Telephone Encounter (Signed)
 Left message to return phone call.

## 2023-11-24 DIAGNOSIS — E039 Hypothyroidism, unspecified: Secondary | ICD-10-CM | POA: Diagnosis not present

## 2023-11-24 DIAGNOSIS — F02A0 Dementia in other diseases classified elsewhere, mild, without behavioral disturbance, psychotic disturbance, mood disturbance, and anxiety: Secondary | ICD-10-CM | POA: Diagnosis not present

## 2023-11-24 DIAGNOSIS — I129 Hypertensive chronic kidney disease with stage 1 through stage 4 chronic kidney disease, or unspecified chronic kidney disease: Secondary | ICD-10-CM | POA: Diagnosis not present

## 2023-11-24 DIAGNOSIS — N1832 Chronic kidney disease, stage 3b: Secondary | ICD-10-CM | POA: Diagnosis not present

## 2023-11-24 DIAGNOSIS — K5730 Diverticulosis of large intestine without perforation or abscess without bleeding: Secondary | ICD-10-CM | POA: Diagnosis not present

## 2023-11-24 DIAGNOSIS — D509 Iron deficiency anemia, unspecified: Secondary | ICD-10-CM | POA: Diagnosis not present

## 2023-11-24 DIAGNOSIS — N179 Acute kidney failure, unspecified: Secondary | ICD-10-CM | POA: Diagnosis not present

## 2023-11-24 DIAGNOSIS — E1122 Type 2 diabetes mellitus with diabetic chronic kidney disease: Secondary | ICD-10-CM | POA: Diagnosis not present

## 2023-11-24 DIAGNOSIS — H5120 Internuclear ophthalmoplegia, unspecified eye: Secondary | ICD-10-CM | POA: Diagnosis not present

## 2023-11-29 ENCOUNTER — Telehealth: Payer: Self-pay

## 2023-11-29 ENCOUNTER — Other Ambulatory Visit

## 2023-11-29 NOTE — Telephone Encounter (Signed)
 Copied from CRM [id number]. Topic: General - Other >> Nov 29, 2023  3:20 PM Annelle Kiel wrote: Reason for CRM: patient  refused otc [facility] called in stating this cb [phone]

## 2023-11-30 DIAGNOSIS — N1832 Chronic kidney disease, stage 3b: Secondary | ICD-10-CM | POA: Diagnosis not present

## 2023-11-30 DIAGNOSIS — E1122 Type 2 diabetes mellitus with diabetic chronic kidney disease: Secondary | ICD-10-CM | POA: Diagnosis not present

## 2023-11-30 DIAGNOSIS — N179 Acute kidney failure, unspecified: Secondary | ICD-10-CM | POA: Diagnosis not present

## 2023-11-30 DIAGNOSIS — I129 Hypertensive chronic kidney disease with stage 1 through stage 4 chronic kidney disease, or unspecified chronic kidney disease: Secondary | ICD-10-CM | POA: Diagnosis not present

## 2023-11-30 DIAGNOSIS — F02A0 Dementia in other diseases classified elsewhere, mild, without behavioral disturbance, psychotic disturbance, mood disturbance, and anxiety: Secondary | ICD-10-CM | POA: Diagnosis not present

## 2023-11-30 DIAGNOSIS — D509 Iron deficiency anemia, unspecified: Secondary | ICD-10-CM | POA: Diagnosis not present

## 2023-11-30 DIAGNOSIS — H5120 Internuclear ophthalmoplegia, unspecified eye: Secondary | ICD-10-CM | POA: Diagnosis not present

## 2023-11-30 DIAGNOSIS — K5730 Diverticulosis of large intestine without perforation or abscess without bleeding: Secondary | ICD-10-CM | POA: Diagnosis not present

## 2023-11-30 DIAGNOSIS — E039 Hypothyroidism, unspecified: Secondary | ICD-10-CM | POA: Diagnosis not present

## 2023-12-01 DIAGNOSIS — N179 Acute kidney failure, unspecified: Secondary | ICD-10-CM | POA: Diagnosis not present

## 2023-12-01 DIAGNOSIS — N1832 Chronic kidney disease, stage 3b: Secondary | ICD-10-CM | POA: Diagnosis not present

## 2023-12-01 DIAGNOSIS — F02A0 Dementia in other diseases classified elsewhere, mild, without behavioral disturbance, psychotic disturbance, mood disturbance, and anxiety: Secondary | ICD-10-CM | POA: Diagnosis not present

## 2023-12-01 DIAGNOSIS — E039 Hypothyroidism, unspecified: Secondary | ICD-10-CM | POA: Diagnosis not present

## 2023-12-01 DIAGNOSIS — H5120 Internuclear ophthalmoplegia, unspecified eye: Secondary | ICD-10-CM | POA: Diagnosis not present

## 2023-12-01 DIAGNOSIS — I129 Hypertensive chronic kidney disease with stage 1 through stage 4 chronic kidney disease, or unspecified chronic kidney disease: Secondary | ICD-10-CM | POA: Diagnosis not present

## 2023-12-01 DIAGNOSIS — K5730 Diverticulosis of large intestine without perforation or abscess without bleeding: Secondary | ICD-10-CM | POA: Diagnosis not present

## 2023-12-01 DIAGNOSIS — D509 Iron deficiency anemia, unspecified: Secondary | ICD-10-CM | POA: Diagnosis not present

## 2023-12-01 DIAGNOSIS — E1122 Type 2 diabetes mellitus with diabetic chronic kidney disease: Secondary | ICD-10-CM | POA: Diagnosis not present

## 2023-12-02 NOTE — Telephone Encounter (Signed)
 Spoke to [facility] and she just wanted to let bCory know pt refused Occupational Therapy.

## 2023-12-07 ENCOUNTER — Ambulatory Visit: Payer: Medicare HMO | Admitting: Physician Assistant

## 2023-12-08 DIAGNOSIS — F02A0 Dementia in other diseases classified elsewhere, mild, without behavioral disturbance, psychotic disturbance, mood disturbance, and anxiety: Secondary | ICD-10-CM | POA: Diagnosis not present

## 2023-12-08 DIAGNOSIS — N1832 Chronic kidney disease, stage 3b: Secondary | ICD-10-CM | POA: Diagnosis not present

## 2023-12-08 DIAGNOSIS — K5730 Diverticulosis of large intestine without perforation or abscess without bleeding: Secondary | ICD-10-CM | POA: Diagnosis not present

## 2023-12-08 DIAGNOSIS — N179 Acute kidney failure, unspecified: Secondary | ICD-10-CM | POA: Diagnosis not present

## 2023-12-08 DIAGNOSIS — I129 Hypertensive chronic kidney disease with stage 1 through stage 4 chronic kidney disease, or unspecified chronic kidney disease: Secondary | ICD-10-CM | POA: Diagnosis not present

## 2023-12-08 DIAGNOSIS — E039 Hypothyroidism, unspecified: Secondary | ICD-10-CM | POA: Diagnosis not present

## 2023-12-08 DIAGNOSIS — D509 Iron deficiency anemia, unspecified: Secondary | ICD-10-CM | POA: Diagnosis not present

## 2023-12-08 DIAGNOSIS — H5120 Internuclear ophthalmoplegia, unspecified eye: Secondary | ICD-10-CM | POA: Diagnosis not present

## 2023-12-08 DIAGNOSIS — E1122 Type 2 diabetes mellitus with diabetic chronic kidney disease: Secondary | ICD-10-CM | POA: Diagnosis not present

## 2023-12-11 DIAGNOSIS — N1832 Chronic kidney disease, stage 3b: Secondary | ICD-10-CM | POA: Diagnosis not present

## 2023-12-11 DIAGNOSIS — E039 Hypothyroidism, unspecified: Secondary | ICD-10-CM | POA: Diagnosis not present

## 2023-12-11 DIAGNOSIS — E1122 Type 2 diabetes mellitus with diabetic chronic kidney disease: Secondary | ICD-10-CM | POA: Diagnosis not present

## 2023-12-11 DIAGNOSIS — I129 Hypertensive chronic kidney disease with stage 1 through stage 4 chronic kidney disease, or unspecified chronic kidney disease: Secondary | ICD-10-CM | POA: Diagnosis not present

## 2023-12-11 DIAGNOSIS — K5730 Diverticulosis of large intestine without perforation or abscess without bleeding: Secondary | ICD-10-CM | POA: Diagnosis not present

## 2023-12-11 DIAGNOSIS — F02A0 Dementia in other diseases classified elsewhere, mild, without behavioral disturbance, psychotic disturbance, mood disturbance, and anxiety: Secondary | ICD-10-CM | POA: Diagnosis not present

## 2023-12-11 DIAGNOSIS — D509 Iron deficiency anemia, unspecified: Secondary | ICD-10-CM | POA: Diagnosis not present

## 2023-12-11 DIAGNOSIS — H5120 Internuclear ophthalmoplegia, unspecified eye: Secondary | ICD-10-CM | POA: Diagnosis not present

## 2023-12-11 DIAGNOSIS — N179 Acute kidney failure, unspecified: Secondary | ICD-10-CM | POA: Diagnosis not present

## 2023-12-15 DIAGNOSIS — I129 Hypertensive chronic kidney disease with stage 1 through stage 4 chronic kidney disease, or unspecified chronic kidney disease: Secondary | ICD-10-CM | POA: Diagnosis not present

## 2023-12-15 DIAGNOSIS — E1122 Type 2 diabetes mellitus with diabetic chronic kidney disease: Secondary | ICD-10-CM | POA: Diagnosis not present

## 2023-12-15 DIAGNOSIS — N1832 Chronic kidney disease, stage 3b: Secondary | ICD-10-CM | POA: Diagnosis not present

## 2023-12-15 DIAGNOSIS — E039 Hypothyroidism, unspecified: Secondary | ICD-10-CM | POA: Diagnosis not present

## 2023-12-15 DIAGNOSIS — K5730 Diverticulosis of large intestine without perforation or abscess without bleeding: Secondary | ICD-10-CM | POA: Diagnosis not present

## 2023-12-15 DIAGNOSIS — F02A0 Dementia in other diseases classified elsewhere, mild, without behavioral disturbance, psychotic disturbance, mood disturbance, and anxiety: Secondary | ICD-10-CM | POA: Diagnosis not present

## 2023-12-15 DIAGNOSIS — D509 Iron deficiency anemia, unspecified: Secondary | ICD-10-CM | POA: Diagnosis not present

## 2023-12-15 DIAGNOSIS — N179 Acute kidney failure, unspecified: Secondary | ICD-10-CM | POA: Diagnosis not present

## 2023-12-15 DIAGNOSIS — H5120 Internuclear ophthalmoplegia, unspecified eye: Secondary | ICD-10-CM | POA: Diagnosis not present

## 2023-12-16 ENCOUNTER — Ambulatory Visit: Admitting: Adult Health

## 2023-12-21 ENCOUNTER — Encounter: Payer: Self-pay | Admitting: Physician Assistant

## 2023-12-21 ENCOUNTER — Ambulatory Visit: Admitting: Physician Assistant

## 2023-12-21 VITALS — BP 109/63 | Resp 20 | Ht 69.0 in | Wt 170.0 lb

## 2023-12-21 DIAGNOSIS — I639 Cerebral infarction, unspecified: Secondary | ICD-10-CM

## 2023-12-21 DIAGNOSIS — R4701 Aphasia: Secondary | ICD-10-CM | POA: Diagnosis not present

## 2023-12-21 DIAGNOSIS — F03B18 Unspecified dementia, moderate, with other behavioral disturbance: Secondary | ICD-10-CM | POA: Diagnosis not present

## 2023-12-21 NOTE — Progress Notes (Signed)
 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 RH male with a history of extensive cardiovascular history including acute thalamic infarct, subcortical stroke with aphasia with residual left hand tingling and most recently a  incidental L frontal corona radiata non hemorrhagic CVA, with recent CVA 11/2023 (see below),  L Bell's Palsy requiring hospitalization, hyperlipidemia, hypertension, DM2, and a history of B12 deficiency, iron deficiency anemia, hypothyroidism, CKD 3 as well as dementia likely due to vascular disease seen today in follow up for memory loss. Patient is currently on donepezil  10 mg daily and memantine  10 mg twice daily.  Memory is worse than prior, and he had difficulty with the latest MMSE, today at 15/30.  His speech is slower than before with some dysarthria noted not interested in ST.  He is with home health.  He needs more help with his ADLs.  Mood is fair.    Follow up in 6 months. Continue donepezil  10 mg daily and memantine  10 mg twice daily, side effects discussed Recommend good control of her cardiovascular risk factors, follow with cardiologist Continue to control mood as per PCP   Right midbrain punctate infarct, likely secondary to small vessel disease source  Patient was on baby aspirin  81 mg only prior to admission and during the hospitalization he was baby aspirin  and Plavix  75 mg DAPT, then Plavix  alone.  Adherence to his antithrombotics was highly recommended. CT of the head without acute intracranial abnormalities MRI of the brain remarkable for midbrain punctate infarct MRI a of the head and neck remarkable for right P2 stenosis 2D echo with EF 60 to 65% LDL was 63 A1c was 8.8 Discharged to home 11/07/2023 with home health Continue physical therapy for strength and balance Continue secondary stroke prevention with Lipitor  80 mg daily (goal LDL less than 70) and  antihypertensive   Subjective:    This patient is accompanied in the office by his wife who supplements the history.  Previous records as well as any outside records available were reviewed prior to todays visit. Patient was last seen on 06/08/2023   Any changes in memory since last visit? "Worse, it's going downhill"-wife says.  He has more difficulty remembering recent conversations or names.  He likes to watch westerns especially "Gun Smoke" and sports  repeats oneself?  Much less conversant than before Disoriented when walking into a room?  Patient denies    Leaving objects?  May misplace things but not in unusual places   Wandering behavior?  denies   Any personality changes since last visit?  denies   Any worsening depression?:  Denies.   Hallucinations or paranoia?  Denies.   Seizures? denies    Any sleep changes? Sleeps "a lot" . Denies vivid dreams, REM behavior or sleepwalking   Sleep apnea?   Denies.   Any hygiene concerns? Denies.  Independent of bathing and dressing?  Endorsed  Does the patient needs help with medications?  Wife is in charge   Who is in charge of the finances?  Wife and daughter are in charge     Any changes in appetite?  Eats well, 3 big meals daily.  Enjoys ice cream.    Patient have trouble swallowing? Denies.   Does the patient cook? No Any headaches?   denies   Chronic back pain  denies   Ambulates with difficulty? He does PT once a week.needs a cane, walker to ambulate Recent falls  or head injuries? Denies.     Unilateral weakness, numbness or tingling?  Until recently, he had residual left hand numbness and tingling, and was seen Dr. Janett Medin for a posterior stroke.  He then had a acutely ischemic stroke, almost 12/2022 (incidental after he presented with left facial weakness and generalized weakness and 2 symptoms consistent with Bell's palsy on the left).  Since his last visit, he sustained another punctate stroke to the right midbrain involving  diplopia, secondary to small vessel stroke, resolved after discharge from hospital   Any tremors?  Denies   Any anosmia?  Denies   Any incontinence of urine?  Endorsed, wears diapers Any bowel dysfunction?   Denies      Patient lives with wife and daughter Does the patient drive? No longer drives    History on Initial Assessment 10/12/2018: This is a 83 year old right-handed man with a history of hypertension, hyperlipidemia, diabetes, subcortical stroke in 2001 with residual left hand tingling, presenting for evaluation of memory loss. He states his memory is bad, he started noticing changes a couple of years ago. He denies getting lost driving. He misses bills because "there is no money to pay them." He manages his own medications and denies missing doses, his wife agrees. He was reporting difficulty remembering what he read in the bible or magazine after he finishes reading. He also has difficulty remembering names. His wife states that yesterday the bank called her because he was there and could not remember his social security number. She reports he got lost last month when he made a wrong turn. She reports other bills are paid but sometimes late, he has not paid taxes and car insurance from last year, he told her he will pay when ready. His wife asked to speak privately about her concerns, she is asking for a pill to "calm him down." She reports he is a "time bomb in the morning, I am his worst enemy." He would sit watching TV all day. Symptoms started last summer, he started sleeping upstairs because he was getting irritated with people. Their daughter lives with them. She denies any paranoia or hallucinations. Sleep is good. He is independent with dressing and bathing.   He denies any headaches, dizziness, diplopia, dysarthria/dysphagia, neck/back pain, focal weakness, bowel/bladder dysfunction, anosmia, or tremors. He has chronic left hand tingling since his stroke. He has had nasal discharge since  throat surgery in 2017. He has been on Donepezil  since 2017, no side effects. No family history of dementia. No history of significant head injuries or alcohol  use.   I personally reviewed MRI brain in 2016 showing an acute punctate nonhemorrhagic infarct within the left paramedian superior midbrain and inferomedial thalamus, multiple remote bilateral lacunar infarcts, advanced chronic microvascular disease, multiple foci of remote hemorrhage    Recent studies personally reviewed   Acute ischemic stroke  MRI of the brain March 15, 2023: 5 mm acute ischemic nonhemorrhagic infarct involving the left frontal corona radiata. 2. Subtle diffusion signal abnormality about the contralateral anterior right basal ganglia, likely an evolving late subacute ischemic infarct. 3. Underlying age-related cerebral atrophy with advanced chronic microvascular ischemic disease, with multiple remote lacunar infarcts involving the hemispheric cerebral white matter, deep gray nuclei, and pons. 4. Multiple scattered chronic micro hemorrhages clustered about the brainstem and deep gray nuclei, most characteristic of chronic poorly controlled hypertension.     CTA of the head and neck was negative for large vessel occlusion but has severe long segment  stenosis of the right P2 segment.    2D echo shows LV ejection fraction of 55 to 60% with grade 1 diastolic dysfunction.   LDL 55, A1c poorly controlled 9.4    PREVIOUS MEDICATIONS:   CURRENT MEDICATIONS:  Outpatient Encounter Medications as of 12/21/2023  Medication Sig   ACCU-CHEK AVIVA PLUS test strip TEST BLOOD SUGAR TWICE DAILY   amLODipine  (NORVASC ) 2.5 MG tablet Take 1 tablet (2.5 mg total) by mouth daily.   atorvastatin  (LIPITOR ) 80 MG tablet TAKE 1 TABLET EVERY DAY   Blood Glucose Monitoring Suppl (ACCU-CHEK GUIDE) w/Device KIT USE AS DIRECTED   citalopram  (CELEXA ) 20 MG tablet TAKE 1 TABLET EVERY DAY   clopidogrel  (PLAVIX ) 75 MG tablet Take 1 tablet (75 mg  total) by mouth daily.   donepezil  (ARICEPT ) 10 MG tablet TAKE 1 TABLET AT BEDTIME   glipiZIDE  (GLUCOTROL  XL) 10 MG 24 hr tablet TAKE 1 TABLET EVERY DAY WITH BREAKFAST   levothyroxine  (SYNTHROID ) 50 MCG tablet TAKE 1 TABLET EVERY DAY   memantine  (NAMENDA ) 10 MG tablet TAKE 1 TABLET TWICE DAILY   Semaglutide (OZEMPIC, 0.25 OR 0.5 MG/DOSE, [place]) Inject 0.25 mg into the skin once a week. Waiting to start once PAP product arrives   No facility-administered encounter medications on file as of 12/21/2023.       12/21/2023    5:00 PM 12/07/2022    6:00 PM 03/18/2022   11:35 AM  MMSE - Mini Mental State Exam  Orientation to time 1 2 1   Orientation to Place 4 3 5   Registration 2 3 3   Attention/ Calculation 2 1 3   Recall 0 1 1  Language- name 2 objects 2 2 2   Language- repeat 0 1 1  Language- follow 3 step command 3 3 3   Language- read & follow direction 1 1 1   Write a sentence 0 1 1  Copy design 0 0 0  Total score 15 18 21       10/12/2018    2:00 PM  Montreal Cognitive Assessment   Visuospatial/ Executive (0/5) 1  Naming (0/3) 1  Attention: Read list of digits (0/2) 1  Attention: Read list of letters (0/1) 0  Attention: Serial 7 subtraction starting at 100 (0/3) 0  Language: Repeat phrase (0/2) 0  Language : Fluency (0/1) 0  Abstraction (0/2) 0  Delayed Recall (0/5) 1  Orientation (0/6) 6  Total 10  Adjusted Score (based on education) 11    Objective:     PHYSICAL EXAMINATION:    VITALS:   Vitals:   12/21/23 1317  BP: 109/63  Resp: 20  SpO2: 98%  Weight: 170 lb (77.1 kg)  Height: 5\' 9"  (1.753 m)    GEN:  The patient appears stated age and is in NAD. HEENT:  Normocephalic, atraumatic.   Neurological examination:  General: NAD, well-groomed, appears stated age. Orientation: The patient is alert. Oriented to person, place and date Cranial nerves: There is good facial symmetry.The speech not fluent or clear, speech is slow, she has dysarthria, no aphasia.  Fund of  knowledge is reduced. Recent and remote memory are impaired. Attention and concentration are reduced.  Able to name objects unable to repeat phrases.  Hearing is intact to conversational tone.  Sensation: Sensation is intact to light touch throughout Motor: Strength is at least antigravity x4 upper extremities, lower extremities 4 out of 5. DTR's 2/4 in UE/LE     Movement examination: Tone: There is normal tone in the UE/LE Abnormal movements:  no tremor.  No myoclonus.  No asterixis.   Coordination:  There is no decremation with RAM's. Normal finger to nose  Gait and Station: The patient has no difficulty arising out of a deep-seated chair without the use of the hands. The patient's stride length is short, gait is cautious and narrow.    Thank you for allowing us  the opportunity to participate in the care of this nice patient. Please do not hesitate to contact us  for any questions or concerns.   Total time spent on today's visit was 43 minutes dedicated to this patient today, preparing to see patient, examining the patient, ordering tests and/or medications and counseling the patient, documenting clinical information in the EHR or other health record, independently interpreting results and communicating results to the patient/family, discussing treatment and goals, answering patient's questions and coordinating care.  Cc:  Alto Atta, NP  Tex Filbert 12/21/2023 5:49 PM

## 2023-12-21 NOTE — Patient Instructions (Signed)
It was a pleasure to see you today at our office.   Recommendations:  Continue donepezil 10 mg daily. Side effects were discussed  Continue Memantine 10 mg twice daily.Side effects were discussed  Continue the mood medication citalopram 20 mg daily Increase your level of activity  Follow up in 6 months     RECOMMENDATIONS FOR ALL PATIENTS WITH MEMORY PROBLEMS: 1. Continue to exercise (Recommend 30 minutes of walking everyday, or 3 hours every week) 2. Increase social interactions - continue going to [place] and enjoy social gatherings with friends and family 3. Eat healthy, avoid fried foods and eat more fruits and vegetables 4. Maintain adequate blood pressure, blood sugar, and blood cholesterol level. Reducing the risk of stroke and cardiovascular disease also helps promoting better memory. 5. Avoid stressful situations. Live a simple life and avoid aggravations. Organize your time and prepare for the next day in anticipation. 6. Sleep well, avoid any interruptions of sleep and avoid any distractions in the bedroom that may interfere with adequate sleep quality 7. Avoid sugar, avoid sweets as there is a strong link between excessive sugar intake, diabetes, and cognitive impairment We discussed the Mediterranean diet, which has been shown to help patients reduce the risk of progressive memory disorders and reduces cardiovascular risk. This includes eating fish, eat fruits and green leafy vegetables, nuts like almonds and hazelnuts, walnuts, and also use olive oil. Avoid fast foods and fried foods as much as possible. Avoid sweets and sugar as sugar use has been linked to worsening of memory function.  There is always a concern of gradual progression of memory problems. If this is the case, then we may need to adjust level of care according to patient needs. Support, both to the patient and caregiver, should then be put into place.    FALL PRECAUTIONS: Be cautious when walking. Scan the  area for obstacles that may increase the risk of trips and falls. When getting up in the mornings, sit up at the edge of the bed for a few minutes before getting out of bed. Consider elevating the bed at the head end to avoid drop of blood pressure when getting up. Walk always in a well-lit room (use night lights in the walls). Avoid area rugs or power cords from appliances in the middle of the walkways. Use a walker or a cane if necessary and consider physical therapy for balance exercise. Get your eyesight checked regularly.   HOME SAFETY: Consider the safety of the kitchen when operating appliances like stoves, microwave oven, and blender. Consider having supervision and share cooking responsibilities until no longer able to participate in those. Accidents with firearms and other hazards in the house should be identified and addressed as well.   ABILITY TO BE LEFT ALONE: If patient is unable to contact 911 operator, consider using LifeLine, or when the need is there, arrange for someone to stay with patients. Smoking is a fire hazard, consider supervision or cessation. Risk of wandering should be assessed by caregiver and if detected at any point, supervision and safe proof recommendations should be instituted       Mediterranean Diet A Mediterranean diet refers to food and lifestyle choices that are based on the traditions of countries located on the [employer]. This way of eating has been shown to help prevent certain conditions and improve outcomes for people who have chronic diseases, like kidney disease and heart disease. What are tips for following this plan? Lifestyle  Cook and eat  meals together with your family, when possible. Drink enough fluid to keep your urine clear or pale yellow. Be physically active every day. This includes: Aerobic exercise like running or swimming. Leisure activities like gardening, walking, or housework. Get 7-8 hours of sleep each night. If  recommended by your health care provider, drink red wine in moderation. This means 1 glass a day for nonpregnant women and 2 glasses a day for men. A glass of wine equals 5 oz (150 mL). Reading food labels  Check the serving size of packaged foods. For foods such as rice and pasta, the serving size refers to the amount of cooked product, not dry. Check the total fat in packaged foods. Avoid foods that have saturated fat or trans fats. Check the ingredients list for added sugars, such as corn syrup. Shopping  At the grocery store, buy most of your food from the areas near the walls of the store. This includes: Fresh fruits and vegetables (produce). Grains, beans, nuts, and seeds. Some of these may be available in unpackaged forms or large amounts (in bulk). Fresh seafood. Poultry and eggs. Low-fat dairy products. Buy whole ingredients instead of prepackaged foods. Buy fresh fruits and vegetables in-season from local farmers markets. Buy frozen fruits and vegetables in resealable bags. If you do not have access to quality fresh seafood, buy precooked frozen shrimp or canned fish, such as tuna, salmon, or sardines. Buy small amounts of raw or cooked vegetables, salads, or olives from the deli or salad bar at your store. Stock your pantry so you always have certain foods on hand, such as olive oil, canned tuna, canned tomatoes, rice, pasta, and beans. Cooking  Cook foods with extra-virgin olive oil instead of using butter or other vegetable oils. Have meat as a side dish, and have vegetables or grains as your main dish. This means having meat in small portions or adding small amounts of meat to foods like pasta or stew. Use beans or vegetables instead of meat in common dishes like chili or lasagna. Experiment with different cooking methods. Try roasting or broiling vegetables instead of steaming or sauteing them. Add frozen vegetables to soups, stews, pasta, or rice. Add nuts or seeds for added  healthy fat at each meal. You can add these to yogurt, salads, or vegetable dishes. Marinate fish or vegetables using olive oil, lemon juice, garlic, and fresh herbs. Meal planning  Plan to eat 1 vegetarian meal one day each week. Try to work up to 2 vegetarian meals, if possible. Eat seafood 2 or more times a week. Have healthy snacks readily available, such as: Vegetable sticks with hummus. Greek yogurt. Fruit and nut trail mix. Eat balanced meals throughout the week. This includes: Fruit: 2-3 servings a day Vegetables: 4-5 servings a day Low-fat dairy: 2 servings a day Fish, poultry, or lean meat: 1 serving a day Beans and legumes: 2 or more servings a week Nuts and seeds: 1-2 servings a day Whole grains: 6-8 servings a day Extra-virgin olive oil: 3-4 servings a day Limit red meat and sweets to only a few servings a month What are my food choices? Mediterranean diet Recommended Grains: Whole-grain pasta. Brown rice. Bulgar wheat. Polenta. Couscous. Whole-wheat bread. Orpah Cobb. Vegetables: Artichokes. Beets. Broccoli. Cabbage. Carrots. Eggplant. Green beans. Chard. Kale. Spinach. Onions. Leeks. Peas. Squash. Tomatoes. Peppers. Radishes. Fruits: Apples. Apricots. Avocado. Berries. Bananas. Cherries. Dates. Figs. Grapes. Lemons. Melon. Oranges. Peaches. Plums. Pomegranate. Meats and other protein foods: Beans. Almonds. Sunflower seeds. Pine nuts. Peanuts.  [place]. [place]. Scallops. Shrimp. Tuna. Tilapia. Clams. Oysters. Eggs. Dairy: Low-fat milk. Cheese. Greek yogurt. Beverages: Water. Red wine. Herbal tea. Fats and oils: Extra virgin olive oil. Avocado oil. Grape seed oil. Sweets and desserts: [place] yogurt with honey. Baked apples. Poached pears. Trail mix. Seasoning and other foods: Basil. Cilantro. Coriander. Cumin. Mint. Parsley. Sage. Rosemary. Tarragon. Garlic. Oregano. Thyme. Pepper. Balsalmic vinegar. Tahini. Hummus. Tomato sauce. Olives. Mushrooms. Limit these Grains:  Prepackaged pasta or rice dishes. Prepackaged cereal with added sugar. Vegetables: Deep fried potatoes (french fries). Fruits: Fruit canned in syrup. Meats and other protein foods: Beef. Pork. Lamb. Poultry with skin. Hot dogs. Tomasa Blase. Dairy: Ice cream. Sour cream. Whole milk. Beverages: Juice. Sugar-sweetened soft drinks. Beer. Liquor and spirits. Fats and oils: Butter. Canola oil. Vegetable oil. Beef fat (tallow). Lard. Sweets and desserts: Cookies. Cakes. Pies. Candy. Seasoning and other foods: Mayonnaise. Premade sauces and marinades. The items listed may not be a complete list. Talk with your dietitian about what dietary choices are right for you. Summary The Mediterranean diet includes both food and lifestyle choices. Eat a variety of fresh fruits and vegetables, beans, nuts, seeds, and whole grains. Limit the amount of red meat and sweets that you eat. Talk with your health care provider about whether it is safe for you to drink red wine in moderation. This means 1 glass a day for nonpregnant women and 2 glasses a day for men. A glass of wine equals 5 oz (150 mL). This information is not intended to replace advice given to you by your health care provider. Make sure you discuss any questions you have with your health care provider. Document Released: 03/19/2016 Document Revised: 04/21/2016 Document Reviewed: 03/19/2016 Elsevier Interactive Patient Education  2017 [employer].

## 2023-12-22 DIAGNOSIS — K5730 Diverticulosis of large intestine without perforation or abscess without bleeding: Secondary | ICD-10-CM | POA: Diagnosis not present

## 2023-12-22 DIAGNOSIS — D509 Iron deficiency anemia, unspecified: Secondary | ICD-10-CM | POA: Diagnosis not present

## 2023-12-22 DIAGNOSIS — N1832 Chronic kidney disease, stage 3b: Secondary | ICD-10-CM | POA: Diagnosis not present

## 2023-12-22 DIAGNOSIS — F02A0 Dementia in other diseases classified elsewhere, mild, without behavioral disturbance, psychotic disturbance, mood disturbance, and anxiety: Secondary | ICD-10-CM | POA: Diagnosis not present

## 2023-12-22 DIAGNOSIS — H5120 Internuclear ophthalmoplegia, unspecified eye: Secondary | ICD-10-CM | POA: Diagnosis not present

## 2023-12-22 DIAGNOSIS — E039 Hypothyroidism, unspecified: Secondary | ICD-10-CM | POA: Diagnosis not present

## 2023-12-22 DIAGNOSIS — E1122 Type 2 diabetes mellitus with diabetic chronic kidney disease: Secondary | ICD-10-CM | POA: Diagnosis not present

## 2023-12-22 DIAGNOSIS — I129 Hypertensive chronic kidney disease with stage 1 through stage 4 chronic kidney disease, or unspecified chronic kidney disease: Secondary | ICD-10-CM | POA: Diagnosis not present

## 2023-12-22 DIAGNOSIS — N179 Acute kidney failure, unspecified: Secondary | ICD-10-CM | POA: Diagnosis not present

## 2023-12-23 DIAGNOSIS — E1122 Type 2 diabetes mellitus with diabetic chronic kidney disease: Secondary | ICD-10-CM | POA: Diagnosis not present

## 2023-12-23 DIAGNOSIS — N179 Acute kidney failure, unspecified: Secondary | ICD-10-CM | POA: Diagnosis not present

## 2023-12-23 DIAGNOSIS — E039 Hypothyroidism, unspecified: Secondary | ICD-10-CM | POA: Diagnosis not present

## 2023-12-23 DIAGNOSIS — D509 Iron deficiency anemia, unspecified: Secondary | ICD-10-CM | POA: Diagnosis not present

## 2023-12-23 DIAGNOSIS — H5120 Internuclear ophthalmoplegia, unspecified eye: Secondary | ICD-10-CM | POA: Diagnosis not present

## 2023-12-23 DIAGNOSIS — K5730 Diverticulosis of large intestine without perforation or abscess without bleeding: Secondary | ICD-10-CM | POA: Diagnosis not present

## 2023-12-23 DIAGNOSIS — N1832 Chronic kidney disease, stage 3b: Secondary | ICD-10-CM | POA: Diagnosis not present

## 2023-12-23 DIAGNOSIS — F02A0 Dementia in other diseases classified elsewhere, mild, without behavioral disturbance, psychotic disturbance, mood disturbance, and anxiety: Secondary | ICD-10-CM | POA: Diagnosis not present

## 2023-12-23 DIAGNOSIS — I129 Hypertensive chronic kidney disease with stage 1 through stage 4 chronic kidney disease, or unspecified chronic kidney disease: Secondary | ICD-10-CM | POA: Diagnosis not present

## 2023-12-29 DIAGNOSIS — E1122 Type 2 diabetes mellitus with diabetic chronic kidney disease: Secondary | ICD-10-CM | POA: Diagnosis not present

## 2023-12-29 DIAGNOSIS — K5730 Diverticulosis of large intestine without perforation or abscess without bleeding: Secondary | ICD-10-CM | POA: Diagnosis not present

## 2023-12-29 DIAGNOSIS — F02A0 Dementia in other diseases classified elsewhere, mild, without behavioral disturbance, psychotic disturbance, mood disturbance, and anxiety: Secondary | ICD-10-CM | POA: Diagnosis not present

## 2023-12-29 DIAGNOSIS — H5120 Internuclear ophthalmoplegia, unspecified eye: Secondary | ICD-10-CM | POA: Diagnosis not present

## 2023-12-29 DIAGNOSIS — N179 Acute kidney failure, unspecified: Secondary | ICD-10-CM | POA: Diagnosis not present

## 2023-12-29 DIAGNOSIS — I129 Hypertensive chronic kidney disease with stage 1 through stage 4 chronic kidney disease, or unspecified chronic kidney disease: Secondary | ICD-10-CM | POA: Diagnosis not present

## 2023-12-29 DIAGNOSIS — N1832 Chronic kidney disease, stage 3b: Secondary | ICD-10-CM | POA: Diagnosis not present

## 2023-12-29 DIAGNOSIS — E039 Hypothyroidism, unspecified: Secondary | ICD-10-CM | POA: Diagnosis not present

## 2023-12-29 DIAGNOSIS — D509 Iron deficiency anemia, unspecified: Secondary | ICD-10-CM | POA: Diagnosis not present

## 2024-01-05 ENCOUNTER — Telehealth: Payer: Self-pay

## 2024-01-05 ENCOUNTER — Telehealth: Payer: Self-pay | Admitting: Adult Health

## 2024-01-05 NOTE — Telephone Encounter (Signed)
 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  She said [employer] said the letter would be sufficient.    Please fax letter to [phone]

## 2024-01-05 NOTE — Telephone Encounter (Signed)
**Note De-identified  [place] Obfuscation** Please advise 

## 2024-01-05 NOTE — Telephone Encounter (Signed)
 Called pt spouse to advised that pt assistant Ozempic is ready for pick up. She verbalized understanding.

## 2024-01-06 ENCOUNTER — Encounter: Payer: Self-pay | Admitting: Adult Health

## 2024-01-06 DIAGNOSIS — L84 Corns and callosities: Secondary | ICD-10-CM | POA: Diagnosis not present

## 2024-01-06 DIAGNOSIS — M79675 Pain in left toe(s): Secondary | ICD-10-CM | POA: Diagnosis not present

## 2024-01-06 DIAGNOSIS — B351 Tinea unguium: Secondary | ICD-10-CM | POA: Diagnosis not present

## 2024-01-06 DIAGNOSIS — E1142 Type 2 diabetes mellitus with diabetic polyneuropathy: Secondary | ICD-10-CM | POA: Diagnosis not present

## 2024-01-06 DIAGNOSIS — M79674 Pain in right toe(s): Secondary | ICD-10-CM | POA: Diagnosis not present

## 2024-01-06 NOTE — Telephone Encounter (Signed)
 Noted

## 2024-01-06 NOTE — Telephone Encounter (Signed)
 Pt spouse notified that letter has been faxed with confirmation. No further action needed.  Aaron Aas

## 2024-01-07 DIAGNOSIS — H5120 Internuclear ophthalmoplegia, unspecified eye: Secondary | ICD-10-CM | POA: Diagnosis not present

## 2024-01-07 DIAGNOSIS — D509 Iron deficiency anemia, unspecified: Secondary | ICD-10-CM | POA: Diagnosis not present

## 2024-01-07 DIAGNOSIS — E1122 Type 2 diabetes mellitus with diabetic chronic kidney disease: Secondary | ICD-10-CM | POA: Diagnosis not present

## 2024-01-07 DIAGNOSIS — I129 Hypertensive chronic kidney disease with stage 1 through stage 4 chronic kidney disease, or unspecified chronic kidney disease: Secondary | ICD-10-CM | POA: Diagnosis not present

## 2024-01-07 DIAGNOSIS — N179 Acute kidney failure, unspecified: Secondary | ICD-10-CM | POA: Diagnosis not present

## 2024-01-07 DIAGNOSIS — F02A0 Dementia in other diseases classified elsewhere, mild, without behavioral disturbance, psychotic disturbance, mood disturbance, and anxiety: Secondary | ICD-10-CM | POA: Diagnosis not present

## 2024-01-07 DIAGNOSIS — E039 Hypothyroidism, unspecified: Secondary | ICD-10-CM | POA: Diagnosis not present

## 2024-01-07 DIAGNOSIS — N1832 Chronic kidney disease, stage 3b: Secondary | ICD-10-CM | POA: Diagnosis not present

## 2024-01-07 DIAGNOSIS — K5730 Diverticulosis of large intestine without perforation or abscess without bleeding: Secondary | ICD-10-CM | POA: Diagnosis not present

## 2024-01-21 ENCOUNTER — Telehealth: Payer: Self-pay | Admitting: Adult Health

## 2024-01-21 NOTE — Telephone Encounter (Signed)
 Daughter dropped off FMLA paper work for patient. Paperwork in folder

## 2024-01-24 NOTE — Telephone Encounter (Signed)
 Noted

## 2024-01-25 NOTE — Telephone Encounter (Signed)
PPW placed on provider's desk.

## 2024-01-28 ENCOUNTER — Telehealth: Payer: Self-pay

## 2024-01-28 NOTE — Progress Notes (Signed)
 01/28/2024 Name: Scott Vasquez MRN: [id number] DOB: 12-03-1940  Chief Complaint  Patient presents with   Medication Management   Diabetes    Scott Vasquez is a 83 y.o. year old male who presented for a telephone visit with wife Scott Vasquez.   They were referred to the pharmacist by their PCP for assistance in managing diabetes.    Subjective:  Care Team: Primary Care Provider: Alto Atta, NP ; Next Scheduled Visit: 02/23/24 (transfer PCP to Betty Swaziland, MD)  Medication Access/Adherence  Current Pharmacy:  [facility] - [place], [place] - [address] [place] [address] Phone: [phone] Fax: [phone]  [facility] [phone] - [place], [place] - [address] [facility] [address] [place] [address] Phone: [phone] Fax: [phone]   Patient reports affordability concerns with their medications: No  Patient reports access/transportation concerns to their pharmacy: No  Patient reports adherence concerns with their medications:  No     Diabetes:  Current medications: Ozempic 0.5mg , Glipizide  10mg  Medications tried in the past: Glimepiride , Humulin 70/30, Actos , Metformin , Januvia , Invokana   Current glucose readings: Unable to report specific readings at this time but denies any low BG readings, checking only 2-3 times per week as wife reports he cannot check his own sugars and she cannot either, children checking, daughter does live with them  Observed patterns:  Patient denies hypoglycemic s/sx including dizziness, shakiness, sweating. Patient denies hyperglycemic symptoms including polyuria, polydipsia, polyphagia, nocturia, neuropathy, blurred vision.  Denies any issues tolerating current dose of Ozempic  Current medication access support: Ozempic through [facility]OV[facility] PAP  Hypertension:  Current medications: Amlodipine  2.5mg  Medications previously tried:  Lisinopril   Patient has a validated, automated, upper arm home BP cuff Current blood pressure readings readings: 130s/60s  Patient denies hypotensive s/sx including dizziness, lightheadedness.  Patient denies hypertensive symptoms including headache, chest pain, shortness of breath  Objective:  Lab Results  Component Value Date   HGBA1C 8.8 (H) 11/06/2023    Lab Results  Component Value Date   CREATININE 1.41 11/10/2023   BUN 25 (H) 11/10/2023   NA 139 11/10/2023   K 3.7 11/10/2023   CL 101 11/10/2023   CO2 29 11/10/2023    Lab Results  Component Value Date   CHOL 108 11/06/2023   HDL 40 (L) 11/06/2023   LDLCALC 53 11/06/2023   TRIG 76 11/06/2023   CHOLHDL 2.7 11/06/2023    Medications Reviewed Today     Reviewed by Carnell Christian, RPH (Pharmacist) on 01/28/24 at 1459  Med List Status: <None>   Medication Order Taking? Sig Documenting Provider Last Dose Status Informant  ACCU-CHEK AVIVA PLUS test strip [id number]  TEST BLOOD SUGAR TWICE DAILY Nafziger, Randel Buss, NP  Active Spouse/Significant Other  amLODipine  (NORVASC ) 2.5 MG tablet [id number] Yes Take 1 tablet (2.5 mg total) by mouth daily. Loma Rising, MD  Active   atorvastatin  (LIPITOR ) 80 MG tablet [id number] Yes TAKE 1 TABLET EVERY DAY Nafziger, Randel Buss, NP  Active Spouse/Significant Other  Blood Glucose Monitoring Suppl (ACCU-CHEK GUIDE) w/Device KIT [id number]  USE AS DIRECTED Nafziger, Randel Buss, NP  Active Spouse/Significant Other  citalopram  (CELEXA ) 20 MG tablet [id number] Yes TAKE 1 TABLET EVERY DAY Alane Allen Sara E, PA-C  Active Spouse/Significant Other  clopidogrel  (PLAVIX ) 75 MG tablet [id number] Yes Take 1 tablet (75 mg total) by mouth daily. Rai, Hurman Maiden, MD  Active   donepezil  (ARICEPT ) 10  MG tablet [id number] Yes TAKE 1 TABLET AT BEDTIME Wertman, Sara E, PA-C  Active Spouse/Significant Other  glipiZIDE  (GLUCOTROL  XL) 10 MG 24 hr tablet [id number] Yes TAKE 1 TABLET EVERY DAY WITH BREAKFAST Nafziger, Cory, NP   Active Spouse/Significant Other  levothyroxine  (SYNTHROID ) 50 MCG tablet [id number] Yes TAKE 1 TABLET EVERY DAY Nafziger, Cory, NP  Active Spouse/Significant Other  memantine  (NAMENDA ) 10 MG tablet [id number] Yes TAKE 1 TABLET TWICE DAILY Wertman, Sara E, PA-C  Active Spouse/Significant Other  Semaglutide (OZEMPIC, 0.25 OR 0.5 MG/DOSE, [place]) [id number] Yes Inject 0.5 mg into the skin once a week. Waiting to start once PAP product arrives [provider]  Active Spouse/Significant Other           Med Note Zilphia Hilt, Daria Eddy   Fri Jan 28, 2024  2:59 PM)                Assessment/Plan:   Diabetes: - Currently uncontrolled - Reviewed long term cardiovascular and renal outcomes of uncontrolled blood sugar - Reviewed goal A1c, goal fasting, and goal 2 hour post prandial glucose - Reviewed dietary modifications including low carb diet -Continue current medication therapy, consider ozempic dose increase at next PCP visit if A1C still elevated - Patient denies personal or family history of multiple endocrine neoplasia type 2, medullary thyroid  cancer; personal history of pancreatitis or gallbladder disease. - Recommend to check glucose daily, reviewed sugar goals     Hypertension: - Currently controlled - Reviewed long term cardiovascular and renal outcomes of uncontrolled blood pressure - Reviewed appropriate blood pressure monitoring technique and reviewed goal blood pressure. Recommended to check home blood pressure and heart rate daily - Recommend to continue current medication therapy. If BP becomes uncontrolled, consider re-addition of low dose lisinopril  back into patient's regimen     Follow Up Plan: 2 months (PCP f/u in 1 month)  Carnell Christian, PharmD Clinical Pharmacist [phone]

## 2024-02-10 ENCOUNTER — Ambulatory Visit: Payer: Self-pay | Admitting: *Deleted

## 2024-02-10 NOTE — Telephone Encounter (Signed)
 FYI Only or Action Required?: FYI only for provider.  Patient was last seen in primary care on 11/10/2023 by Merna Huxley, NP. Called Nurse Triage reporting Fatigue. Symptoms began several days ago. Interventions attempted: Nothing. Symptoms are: gradually worsening.  Triage Disposition: See Physician Within 24 Hours  Patient/caregiver understands and will follow disposition?: No open appointment- UC advised    Reason for Disposition  [1] MODERATE weakness (i.e., interferes with work, school, normal activities) AND [2] persists > 3 days  Answer Assessment - Initial Assessment Questions 1. DESCRIPTION: Describe how you are feeling.     Increased fatigue, wetting bed more often, strong odor 2. SEVERITY: How bad is it?  Can you stand and walk?   - MILD (0-3): Feels weak or tired, but does not interfere with work, school or normal activities.   - MODERATE (4-7): Able to stand and walk; weakness interferes with work, school, or normal activities.   - SEVERE (8-10): Unable to stand or walk; unable to do usual activities.     Moderate/severe 3. ONSET: When did these symptoms begin? (e.g., hours, days, weeks, months)     Started Tuesday 4. CAUSE: What do you think is causing the weakness or fatigue? (e.g., not drinking enough fluids, medical problem, trouble sleeping)     Medical problem 5. NEW MEDICINES:  Have you started on any new medicines recently? (e.g., opioid pain medicines, benzodiazepines, muscle relaxants, antidepressants, antihistamines, neuroleptics, beta blockers)     No new medication  6. OTHER SYMPTOMS: Do you have any other symptoms? (e.g., chest pain, fever, cough, SOB, vomiting, diarrhea, bleeding, other areas of pain)     Diarrhea- one episode Sunday  Protocols used: Weakness (Generalized) and Fatigue-A-AH   Copied from CRM [id number]. Topic: Clinical - Red Word Triage >> Feb 10, 2024  3:02 PM Robinson H wrote: [facility] that prompted transfer to Nurse Triage:  Patients wife states patient has been very fatigue last 2 days, states he has dementia and not sure if its a UTI or what has him so tired

## 2024-02-15 NOTE — Telephone Encounter (Signed)
Called to f/u but no answer.

## 2024-02-16 ENCOUNTER — Ambulatory Visit: Payer: Self-pay

## 2024-02-16 DIAGNOSIS — R8290 Unspecified abnormal findings in urine: Secondary | ICD-10-CM | POA: Diagnosis not present

## 2024-02-16 DIAGNOSIS — R82998 Other abnormal findings in urine: Secondary | ICD-10-CM | POA: Diagnosis not present

## 2024-02-16 NOTE — Telephone Encounter (Signed)
 FYI Only or Action Required?: FYI only for provider.  Patient was last seen in primary care on 11/10/2023 by Merna Huxley, NP.  Called Nurse Triage reporting Urinary Retention.  Symptoms began a week ago.  Interventions attempted: Prescription medications: unsure what urgent care prescribed.  Symptoms are: urinary frequency and foul odor in urine stable.  Triage Disposition: See Physician Within 24 Hours  Patient/caregiver understands and will follow disposition?: Yes           Patient's wife calling in regarding patient's symptoms. She is concerned and asking if the patient should be seen sooner than next week. She states the patient is leaving urgent care and was prescribed medication to treat symptoms. Advised wife if patient develops fever, pain, confusion or any other worsening symptoms to call back.  1. SYMPTOM: What's the main symptom you're concerned about? (e.g., frequency, incontinence) Wife concerned she states the patient was urinating more frequently and there was an odor, patient was able to urinate this morning at home around 11am. Wife is concerned about retention, she states at urgent care they asked for a sample and he was not able to urinate.  2. ONSET: When did the frequency and odor start? X 1 week.  3. PAIN: Is there any pain? If Yes, ask: How bad is it? (Scale: 1-10; mild, moderate, severe) No.  4. CAUSE: What do you think is causing the symptoms? UTI.  5. OTHER SYMPTOMS: Do you have any other symptoms? (e.g., blood in urine, fever, flank pain, pain with urination) Wife denies blood in urine.  6. PREGNANCY: Is there any chance you are pregnant? When was your last menstrual period? N/A.            Copied from CRM [id number]. Topic: Clinical - Red Word Triage >> Feb 16, 2024 12:45 PM Larissa S wrote: [facility] that prompted transfer to Nurse Triage: Unable to urinate Reason for Disposition  Urinating more frequently than usual  (i.e., frequency) OR new-onset of the feeling of an urgent need to urinate (i.e., urgency)  Protocols used: Urinary Symptoms-A-AH

## 2024-02-23 ENCOUNTER — Ambulatory Visit (INDEPENDENT_AMBULATORY_CARE_PROVIDER_SITE_OTHER): Admitting: Family Medicine

## 2024-02-23 ENCOUNTER — Encounter: Payer: Self-pay | Admitting: Family Medicine

## 2024-02-23 VITALS — BP 118/60 | HR 88 | Resp 16 | Ht 69.0 in | Wt 166.5 lb

## 2024-02-23 DIAGNOSIS — N1832 Chronic kidney disease, stage 3b: Secondary | ICD-10-CM | POA: Diagnosis not present

## 2024-02-23 DIAGNOSIS — F01A0 Vascular dementia, mild, without behavioral disturbance, psychotic disturbance, mood disturbance, and anxiety: Secondary | ICD-10-CM

## 2024-02-23 DIAGNOSIS — Z7985 Long-term (current) use of injectable non-insulin antidiabetic drugs: Secondary | ICD-10-CM

## 2024-02-23 DIAGNOSIS — Z7984 Long term (current) use of oral hypoglycemic drugs: Secondary | ICD-10-CM

## 2024-02-23 DIAGNOSIS — E1159 Type 2 diabetes mellitus with other circulatory complications: Secondary | ICD-10-CM

## 2024-02-23 DIAGNOSIS — E1165 Type 2 diabetes mellitus with hyperglycemia: Secondary | ICD-10-CM

## 2024-02-23 DIAGNOSIS — E039 Hypothyroidism, unspecified: Secondary | ICD-10-CM

## 2024-02-23 DIAGNOSIS — R32 Unspecified urinary incontinence: Secondary | ICD-10-CM | POA: Diagnosis not present

## 2024-02-23 DIAGNOSIS — E785 Hyperlipidemia, unspecified: Secondary | ICD-10-CM

## 2024-02-23 LAB — POCT GLYCOSYLATED HEMOGLOBIN (HGB A1C): HbA1c, POC (prediabetic range): 5.8 % (ref 5.7–6.4)

## 2024-02-23 NOTE — Patient Instructions (Addendum)
 A few things to remember from today's visit:  Type 2 diabetes mellitus with other circulatory complication, without long-term current use of insulin  (HCC) - Plan: POC HgB A1c  Decreased Glipizide  from 10 mg to 5 mg, 1/2 tab. Avoid fruit cocktails, you can have 2-3 small portions of fruit (blueberries,strawberries,and raspberries among some).  If you need refills for medications you take chronically, please call your pharmacy. Do not use My Chart to request refills or for acute issues that need immediate attention. If you send a my chart message, it may take a few days to be addressed, specially if I am not in the office.  Please be sure medication list is accurate. If a new problem present, please set up appointment sooner than planned today.

## 2024-02-23 NOTE — Progress Notes (Signed)
 HPI: Scott Vasquez is a 83 y.o. male, with a PMHx significant for iron def anemia, DM II, hypertension, HLD, CVA,dementia, and thyroid  nodule who is here today to establish care. He is accompanied by his wife and son, who are the historians for this visit. He ambulates with a walker. Son and wife provide hx.  Former PCP: Darleene Shape, NP Last preventive routine visit: 07/15/2023  Chronic medical problems:   Hypertension:  Medications: Amlodipine  2.5 mg daily BP readings at home: WNL, recent reading of 125/80 Negative for unusual or severe headache, visual changes, exertional chest pain, dyspnea,  focal weakness, or edema. BP Readings from Last 3 Encounters:  02/23/24 118/60  12/21/23 109/63  11/10/23 130/70   Lab Results  Component Value Date   NA 139 11/10/2023   CL 101 11/10/2023   K 3.7 11/10/2023   CO2 29 11/10/2023   BUN 25 (H) 11/10/2023   CREATININE 1.41 11/10/2023   GFR 45.77 (L) 11/10/2023   CALCIUM  9.4 11/10/2023   PHOS 3.7 11/07/2023   ALBUMIN 4.2 11/10/2023   GLUCOSE 277 (H) 11/10/2023   Hyperlipidemia: Currently on Atorvastatin  80 mg daily  Lab Results  Component Value Date   CHOL 108 11/06/2023   HDL 40 (L) 11/06/2023   LDLCALC 53 11/06/2023   TRIG 76 11/06/2023   CHOLHDL 2.7 11/06/2023   Diabetes Mellitus, type II:  Diagnosed ~15 years ago (83 y.o) according to his historians.  - Checking BG at home: yes, averages around 185, recent reading of 187, no low BGs. - Medications: Semaglutide 0.5 mg weekly and Glipizide  XL 10 mg daily - Compliance: managed by his wife and son - Diet: enjoys canned fruit cocktails (commonly has peaches, pears, grapes, pineapples and cherries) - eye exam: UTD - foot exam: 07/2023 and wife also performs regular foot exams at home - Negative for symptoms of hypoglycemia, polyuria, polydipsia, foot ulcers/trauma  Lab Results  Component Value Date   HGBA1C 8.8 (H) 11/06/2023   Sleep: his wife mentions that  lately he has been sleeping a lot during the day, he seems to be sleeping through the night.  Chronic Kidney Disease: Negative for gross hematuria, foam in urine, or decreased urine output. According to his historians, he mostly drinks water. 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 He is on Plavix  75 mg daily and .   Dementia with behavioral disturbances and aphasia: Managed with Namenda  10 mg twice daily, Donepezil  10 mg at bedtime. He is also on Citalopram  20 mg daily, family not sure about indication but per records it was added to help with episodes of irritability.  He is followed by Camie Sevin, PA-C biannually.   Hypothyroidism: Treated with Levothyroxine  50 mcg daily.  Lab Results  Component Value Date   TSH 5.03 07/15/2023   B12 Deficiency treated with OTC B12 vitamins.   Concerns today:   His son is concerned about episode of urine incontinence while in bed at night. This has been occurring for the  past several months according to his wife and son. Denies hematuria ot complaints of dysuria.  They say that he tends to eat and drink later in the evening and go to bed right after. Hx of Overactive, has been on Oxybutynin  in 28-Mar-2022.  Bladder managed on Gemtesa 75 mg daily, per records review (07/2023)..   Review of Systems  Constitutional:  Negative for activity change, appetite change and fever.  HENT:  Negative for mouth sores and sore throat.   Respiratory:  Negative for cough and wheezing.   Gastrointestinal:  Negative for  abdominal pain, nausea and vomiting.  Endocrine: Negative for cold intolerance and heat intolerance.  Genitourinary:  Positive for enuresis. Negative for decreased urine volume and flank pain.  Musculoskeletal:  Positive for gait problem.  Skin:  Negative for color change and rash.  Neurological:  Negative for syncope and facial asymmetry.  Psychiatric/Behavioral:  Negative for hallucinations.   See other pertinent positives and negatives in HPI.  Current Outpatient Medications on File Prior to Visit  Medication Sig Dispense Refill   ACCU-CHEK AVIVA PLUS test strip TEST BLOOD SUGAR TWICE DAILY 200 strip 3   amLODipine  (NORVASC ) 2.5 MG tablet Take 1 tablet (2.5 mg total) by mouth daily. 30 tablet 3   atorvastatin  (LIPITOR ) 80 MG tablet TAKE 1 TABLET EVERY DAY 90 tablet 3   Blood Glucose Monitoring Suppl (ACCU-CHEK GUIDE) w/Device KIT USE AS DIRECTED 1 kit 0   citalopram  (CELEXA ) 20 MG tablet TAKE 1 TABLET EVERY DAY 90 tablet 3   clopidogrel  (PLAVIX ) 75 MG tablet Take 1 tablet (75 mg total) by mouth daily. 30 tablet 3   donepezil  (ARICEPT ) 10 MG tablet TAKE 1 TABLET AT BEDTIME 90 tablet 3   glipiZIDE  (GLUCOTROL  XL) 10 MG 24 hr tablet TAKE 1 TABLET EVERY DAY WITH BREAKFAST 90 tablet 3   levothyroxine  (SYNTHROID ) 50 MCG tablet TAKE 1 TABLET EVERY DAY 90 tablet 3   memantine  (NAMENDA ) 10 MG tablet TAKE 1 TABLET TWICE DAILY 180 tablet 3   Semaglutide (OZEMPIC, 0.25 OR 0.5 MG/DOSE, Citrus) Inject 0.5 mg into the skin once a week. Waiting to start once PAP product arrives     No current facility-administered medications on file prior to visit.    Past Medical History:  Diagnosis Date   Anemia    iron deficiency   Diabetes mellitus    type II   Diverticulosis of colon    Hypertension    Stroke (HCC)    numbness in left hand   Thyroid  nodule    Allergies  Allergen Reactions   Actos  [Pioglitazone ] Other (See Comments)    Diarrhea    Metformin  And Related Other (See Comments)    Diarrhea       Family History  Problem Relation Age of Onset   Diabetes Other    Diabetes Father        Died from diabetic coma in 03/29/1971   Hypertension Father    Stroke Mother        Died in 65   Hypertension Mother     Social History   Socioeconomic History   Marital status: Married    Spouse name: Not on file   Number of children: 2   Years of education: 8   Highest education level: Not on file  Occupational History   Occupation: retired    Associate Professor: [employer]  Tobacco Use   Smoking status: Former    Current packs/day: 0.00    Average packs/day: 0.5 packs/day  for 20.0 years (10.0 ttl pk-yrs)    Types: Cigarettes    Start date: 34    Quit date: 08/10/1958    Years since quitting: 65.5   Smokeless tobacco: Never  Vaping Use   Vaping status: Never Used  Substance and Sexual Activity   Alcohol  use: No   Drug use: No   Sexual activity: Not on file  Other Topics Concern   Not on file  Social History Narrative   Pt is R handed   Lives in single story home with his wife, [name]   2 adult children   8th grade education   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[employer]   Married 56 years   Social Drivers of [employer]: Low Risk  (05/09/2021)   Overall Financial Resource Strain (CARDIA)    Difficulty of Paying Living Expenses: Not hard at all  Food Insecurity: No Food Insecurity (11/08/2023)   Hunger Vital Sign    Worried About Running Out of Food in the Last Year: Never true    Ran Out of Food in the Last Year: Never true  Transportation Needs: No Transportation Needs (11/08/2023)   PRAPARE - Administrator, Civil Service (Medical): No    Lack of Transportation (Non-Medical): No  Physical Activity: Not on file  Stress: Not on file  Social Connections: Socially Integrated (11/06/2023)   Social Connection and Isolation Panel    Frequency of Communication with Friends and Family: More than three times a week    Frequency of Social Gatherings with  Friends and Family: More than three times a week    Attends Religious Services: More than 4 times per year    Active Member of [employer] or Organizations: Yes    Attends Banker Meetings: More than 4 times per year    Marital Status: Married    Vitals:   02/23/24 1505  BP: 118/60  Pulse: 88  Resp: 16  SpO2: 97%   Body mass index is 24.59 kg/m.  Physical Exam Vitals and nursing note reviewed.  Constitutional:      General: He is not in acute distress.    Appearance: He is well-developed.  HENT:     Head: Normocephalic and atraumatic.  Eyes:     Conjunctiva/sclera: Conjunctivae normal.  Cardiovascular:     Rate and Rhythm: Normal rate and regular rhythm.     Heart sounds: No murmur heard.    Comments: DP pulses palpable. Trace pitting LE edema, bilateral. Pulmonary:     Effort: Pulmonary effort is normal. No respiratory distress.     Breath sounds: Normal breath sounds.  Abdominal:     Palpations: Abdomen is soft. There is no mass.     Tenderness: There is no abdominal tenderness.  Skin:    General: Skin is warm.     Findings: No erythema or rash.  Neurological:     General: No focal deficit present.     Mental Status: He is alert. Mental status is at baseline. He is disoriented.     Comments: Unstable gait assisted with a walker.  Psychiatric:        Mood and Affect: Mood and affect normal.        Speech: He is noncommunicative.    ASSESSMENT AND PLAN: Mr. Brailyn Fleury was seen here today to establish care.  Lab Results  Component Value Date   HGBA1C 5.8 02/23/2024   Lab Results  Component Value Date   [facility]  1.2 02/23/2024    Type 2 diabetes mellitus with other circulatory complication, without long-term current use of insulin  (HCC) Assessment & Plan: HgA1C has improved, it went from 8.8 to 5.8 today. Recommend decreasing dose of Glipizide  XL from 10 mg to 5 mg (1/2 tab), instructed family not crushing tab. Continue Ozempic 0.5 mg  weekly. Recommend a fingerstick when sleepy during the day. Dietary recommendations discussed. Annual eye exam and foot care recommended. F/U in 3-4 months.  Orders: -     POCT glycosylated hemoglobin (Hb A1C) -     Microalbumin / creatinine urine ratio; Future  Stage 3b chronic kidney disease (HCC) Assessment & Plan: We discussed Dx and prognosis. Cr 1.4 and e GFR 45 in 11/2023. Stressed the importance of adequate hydration,BP,and glucose controlled. Continue low salt diet and avoidance of NSAID's.   Acquired hypothyroidism Assessment & Plan: Last TSH 5.0 in 07/2023. Continue Levothyroxine  50 mcg daily.   Hyperlipidemia, unspecified hyperlipidemia type Assessment & Plan: Continue Atorvastatin  80 mg daily. Last LDL 53 in 10/2023.   Urinary incontinence, unspecified type Assessment & Plan: It seems like this is a chronic problem, no symptoms that suggest an acute process. He is supposed to be on Gemtesa 75 mg daily for overactive bladder. Monitor for new symptoms. Recommend avoiding fluid intake 3-4 hours before bedtime.   Mild vascular dementia without behavioral disturbance, psychotic disturbance, mood disturbance, or anxiety (HCC) Assessment & Plan: Changes in sleep pattern could be related to this problem. Following with neurologist.    Return in about 3 months (around 06/02/2024) for chronic problems.  I,Emily Lagle,acting as a Neurosurgeon for [facility] [place], MD.,have documented all relevant documentation on the behalf of Talisa Petrak [place], MD,as directed by  Kyren Knick [place], MD while in the presence of Abimbola Aki [place], MD.  I, Jerrell Hart [place], MD, have reviewed all documentation for this visit. The documentation on 02/24/24 for the exam, diagnosis, procedures, and orders are all accurate and complete.  Terryann Verbeek G. [place], MD  [facility]. [place] office.

## 2024-02-24 ENCOUNTER — Ambulatory Visit: Payer: Self-pay | Admitting: Family Medicine

## 2024-02-24 DIAGNOSIS — R32 Unspecified urinary incontinence: Secondary | ICD-10-CM | POA: Insufficient documentation

## 2024-02-24 LAB — MICROALBUMIN / CREATININE URINE RATIO
Creatinine,U: 226.1 mg/dL
Microalb Creat Ratio: 5.3 mg/g (ref 0.0–30.0)
Microalb, Ur: 1.2 mg/dL (ref 0.0–1.9)

## 2024-02-24 NOTE — Assessment & Plan Note (Signed)
 It seems like this is a chronic problem, no symptoms that suggest an acute process. He is supposed to be on Gemtesa 75 mg daily for overactive bladder. Monitor for new symptoms. Recommend avoiding fluid intake 3-4 hours before bedtime.

## 2024-02-24 NOTE — Assessment & Plan Note (Signed)
 Changes in sleep pattern could be related to this problem. Following with neurologist.

## 2024-02-24 NOTE — Assessment & Plan Note (Signed)
 Continue Atorvastatin  80 mg daily. Last LDL 53 in 10/2023.

## 2024-02-24 NOTE — Assessment & Plan Note (Addendum)
 HgA1C has improved, it went from 8.8 to 5.8 today. Recommend decreasing dose of Glipizide  XL from 10 mg to 5 mg (1/2 tab), instructed family not crushing tab. Continue Ozempic 0.5 mg weekly. Recommend a fingerstick when sleepy during the day. Dietary recommendations discussed. Annual eye exam and foot care recommended. F/U in 3-4 months.

## 2024-02-24 NOTE — Assessment & Plan Note (Signed)
 Last TSH 5.0 in 07/2023. Continue Levothyroxine  50 mcg daily.

## 2024-02-24 NOTE — Assessment & Plan Note (Signed)
 We discussed Dx and prognosis. Cr 1.4 and e GFR 45 in 11/2023. Stressed the importance of adequate hydration,BP,and glucose controlled. Continue low salt diet and avoidance of NSAID's.

## 2024-03-03 ENCOUNTER — Other Ambulatory Visit: Payer: Self-pay

## 2024-03-03 DIAGNOSIS — I10 Essential (primary) hypertension: Secondary | ICD-10-CM

## 2024-03-03 MED ORDER — CLOPIDOGREL BISULFATE 75 MG PO TABS: 75.0000 mg | ORAL_TABLET | Freq: Every day | ORAL | 2 refills | Status: AC

## 2024-03-03 MED ORDER — AMLODIPINE BESYLATE 2.5 MG PO TABS: 2.5000 mg | ORAL_TABLET | Freq: Every day | ORAL | 2 refills | Status: DC

## 2024-03-22 ENCOUNTER — Telehealth: Payer: Self-pay

## 2024-03-22 ENCOUNTER — Other Ambulatory Visit

## 2024-03-22 NOTE — Progress Notes (Signed)
   03/22/2024  Patient ID: Scott Vasquez, male   DOB: 10/07/1940, 83 y.o.   MRN: [id number]  Attempted to contact patient twice for scheduled appointment for medication management. Unable to leave a voicemail.  Jon VEAR Lindau, PharmD Clinical Pharmacist [phone]

## 2024-03-22 NOTE — Progress Notes (Signed)
   03/22/2024  Patient ID: Scott Vasquez, male   DOB: 06/08/1941, 83 y.o.   MRN: [id number]  Received request from [facility] for new rx for Ozempic 0.5mg  for patient.  Sent in order for 4 months with PCP sign off, fax confirmation received.  Jon VEAR Lindau, PharmD Clinical Pharmacist [phone]

## 2024-04-06 DIAGNOSIS — L84 Corns and callosities: Secondary | ICD-10-CM | POA: Diagnosis not present

## 2024-04-06 DIAGNOSIS — B351 Tinea unguium: Secondary | ICD-10-CM | POA: Diagnosis not present

## 2024-04-06 DIAGNOSIS — M79674 Pain in right toe(s): Secondary | ICD-10-CM | POA: Diagnosis not present

## 2024-04-06 DIAGNOSIS — E1142 Type 2 diabetes mellitus with diabetic polyneuropathy: Secondary | ICD-10-CM | POA: Diagnosis not present

## 2024-04-06 DIAGNOSIS — M79675 Pain in left toe(s): Secondary | ICD-10-CM | POA: Diagnosis not present

## 2024-04-11 ENCOUNTER — Other Ambulatory Visit: Payer: Self-pay | Admitting: Adult Health

## 2024-04-11 DIAGNOSIS — E785 Hyperlipidemia, unspecified: Secondary | ICD-10-CM

## 2024-04-11 MED ORDER — ATORVASTATIN CALCIUM 80 MG PO TABS: 80.0000 mg | ORAL_TABLET | Freq: Every day | ORAL | 3 refills | Status: AC

## 2024-04-11 NOTE — Telephone Encounter (Signed)
 Copied from CRM #[id number]. Topic: Clinical - Medication Refill >> Apr 11, 2024 11:19 AM Drema MATSU wrote: Medication: atorvastatin  (LIPITOR ) 80 MG tablet  Has the patient contacted their pharmacy? Yes (Agent: If no, request that the patient contact the pharmacy for the refill. If patient does not wish to contact the pharmacy document the reason why and proceed with request.) (Agent: If yes, when and what did the pharmacy advise?)  This is the patient's preferred pharmacy:  [facility] - [place], [place] - [address] [place] [address] Phone: [phone] Fax: [phone]  Is this the correct pharmacy for this prescription? Yes If no, delete pharmacy and type the correct one.   Has the prescription been filled recently? No  Is the patient out of the medication? Yes  Has the patient been seen for an appointment in the last year OR does the patient have an upcoming appointment? Yes  Can we respond through MyChart? Yes  Agent: Please be advised that Rx refills may take up to 3 business days. We ask that you follow-up with your pharmacy.

## 2024-04-12 ENCOUNTER — Ambulatory Visit: Admitting: Family Medicine

## 2024-04-12 ENCOUNTER — Encounter: Payer: Self-pay | Admitting: Family Medicine

## 2024-04-12 VITALS — BP 118/76 | HR 76 | Resp 16 | Ht 69.0 in | Wt 159.1 lb

## 2024-04-12 DIAGNOSIS — F01A0 Vascular dementia, mild, without behavioral disturbance, psychotic disturbance, mood disturbance, and anxiety: Secondary | ICD-10-CM

## 2024-04-12 DIAGNOSIS — R2681 Unsteadiness on feet: Secondary | ICD-10-CM

## 2024-04-12 DIAGNOSIS — F03B18 Unspecified dementia, moderate, with other behavioral disturbance: Secondary | ICD-10-CM

## 2024-04-12 DIAGNOSIS — Z8679 Personal history of other diseases of the circulatory system: Secondary | ICD-10-CM

## 2024-04-12 NOTE — Patient Instructions (Addendum)
 A few things to remember from today's visit:  CEREBROVASCULAR ACCIDENT, HX OF  Mild vascular dementia without behavioral disturbance, psychotic disturbance, mood disturbance, or anxiety (HCC)  Unstable gait  No changes today. Monitor for new symptoms. Fall precautions to continue.  If you need refills for medications you take chronically, please call your pharmacy. Do not use My Chart to request refills or for acute issues that need immediate attention. If you send a my chart message, it may take a few days to be addressed, specially if I am not in the office.  Please be sure medication list is accurate. If a new problem present, please set up appointment sooner than planned today.

## 2024-04-12 NOTE — Progress Notes (Unsigned)
 ACUTE VISIT Chief Complaint  Patient presents with   dragging of right leg   HPI: [name] is a 83 y.o. male with a PMHx significant for  iron def anemia, DM II, hypertension, HLD, CVA,vascular dementia, and thyroid  nodule. He is accompanied by his wife and son, who are the historians for this visit.  He ambulates with a walker.    They reportedly noticed that his has been dragging his RLE for 2-3 weeks as a sudden onset in the morning when he got up.  Describes that before in the morning, he'd stand up and begin walking, but now when he stands up he hesitates before walking and looks down at his leg. His son also mentions that his left foot also staying turned outward sometimes.   As far as they know, he's had no fall or trauma. They deny changes in his mental status, appetite changes, complaints of headache, change in urinary/bowel habits, LE swelling, rash on leg, or complaints of back pain. He also has not been started on any new medications recently.  He does not seem to be in pain.  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  No changes in daily activities. CKD III: Family has not noted gross hematuria or foam in urine.  Lab Results  Component Value Date   NA 139 11/10/2023   CL 101 11/10/2023   K 3.7 11/10/2023   CO2 29 11/10/2023   BUN 25 (H) 11/10/2023   CREATININE 1.41 11/10/2023   GFR 45.77 (L) 11/10/2023   CALCIUM  9.4 11/10/2023   PHOS 3.7 11/07/2023   ALBUMIN 4.2 11/10/2023   GLUCOSE 277 (H) 11/10/2023   He does have an appointment in November/2025 with Neurology. Vascular dementia on Namenda  10 mg bid and Aricept  10 mg daily. He is on Plavix  75 mg daily and Atorvastatin  80 mg daily.  Review of Systems  Constitutional:  Negative for chills, fever and unexpected weight change.  HENT:  Negative for facial swelling, mouth sores, sore throat and trouble swallowing.   Respiratory:  Negative for cough, shortness  of breath and wheezing.   Cardiovascular:  Negative for chest pain, palpitations and leg swelling.  Gastrointestinal:  Negative for abdominal pain, nausea and vomiting.  Genitourinary:  Negative for decreased urine volume and dysuria.  Musculoskeletal:  Negative for arthralgias, back pain and joint swelling.  Neurological:  Negative for syncope, weakness and headaches.  Psychiatric/Behavioral:  Negative for hallucinations.   See other pertinent positives and negatives in HPI.  Current Outpatient Medications on File Prior to Visit  Medication Sig Dispense Refill   ACCU-CHEK AVIVA PLUS test strip TEST BLOOD SUGAR TWICE DAILY 200 strip 3   amLODipine  (NORVASC ) 2.5 MG tablet Take 1 tablet (2.5 mg total) by mouth daily. 90 tablet 2   atorvastatin  (LIPITOR ) 80 MG tablet Take 1 tablet (80 mg total) by mouth daily. 90 tablet 3   Blood Glucose Monitoring Suppl (ACCU-CHEK GUIDE) w/Device KIT USE AS DIRECTED 1 kit 0   citalopram  (CELEXA ) 20 MG tablet TAKE 1 TABLET EVERY DAY 90 tablet 3   clopidogrel  (PLAVIX ) 75 MG tablet Take 1 tablet (75 mg total) by mouth daily. 90 tablet 2   donepezil  (ARICEPT ) 10 MG tablet TAKE 1 TABLET AT BEDTIME 90 tablet 3   glipiZIDE  (GLUCOTROL  XL) 10 MG 24 hr tablet TAKE 1 TABLET EVERY DAY WITH BREAKFAST 90 tablet 3   levothyroxine  (SYNTHROID ) 50 MCG tablet TAKE 1 TABLET EVERY DAY 90 tablet  3   memantine  (NAMENDA ) 10 MG tablet TAKE 1 TABLET TWICE DAILY 180 tablet 3   Semaglutide (OZEMPIC, 0.25 OR 0.5 MG/DOSE, [place]) Inject 0.5 mg into the skin once a week. Waiting to start once PAP product arrives     No current facility-administered medications on file prior to visit.   Past Medical History:  Diagnosis Date   Anemia    iron deficiency   Diabetes mellitus    type II   Diverticulosis of colon    Hypertension    Stroke (HCC)    numbness in left hand   Thyroid  nodule    Allergies  Allergen Reactions   Actos  [Pioglitazone ] Other (See Comments)    Diarrhea    Metformin   And Related Other (See Comments)    Diarrhea      Social History   Socioeconomic History   Marital status: Married    Spouse name: Not on file   Number of children: 2   Years of education: 8   Highest education level: Not on file  Occupational History   Occupation: retired    Associate Professor: [employer]  Tobacco Use   Smoking status: Former    Current packs/day: 0.00    Average packs/day: 0.5 packs/day for 20.0 years (10.0 ttl pk-yrs)    Types: Cigarettes    Start date: 14    Quit date: 08/10/1958    Years since quitting: 65.7   Smokeless tobacco: Never  Vaping Use   Vaping status: Never Used  Substance and Sexual Activity   Alcohol  use: No   Drug use: No   Sexual activity: Not on file  Other Topics Concern   Not on file  Social History Narrative   Pt is R handed   Lives in single story home with his wife, Scott Vasquez   2 adult children   8th grade education   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[employer]   Married 56 years   Social Drivers of [employer]: Low Risk  (05/09/2021)   Overall Financial Resource Strain (CARDIA)    Difficulty of Paying Living Expenses: Not hard at all  Food Insecurity: No Food Insecurity (11/08/2023)   Hunger Vital Sign    Worried About Running Out of Food in the Last Year: Never true    Ran Out of Food in the Last Year: Never true  Transportation Needs: No Transportation Needs (11/08/2023)   PRAPARE - Administrator, Civil Service (Medical): No    Lack of Transportation (Non-Medical): No  Physical Activity: Not on file  Stress: Not on file  Social Connections: Socially Integrated (11/06/2023)   Social Connection and Isolation Panel    Frequency of Communication with Friends and Family: More than three times a week    Frequency of Social Gatherings with Friends and Family: More than three times a week    Attends Religious Services: More than 4 times per year    Active Member of [employer] or Organizations: Yes    Attends Tax inspector Meetings: More than 4 times per year    Marital Status: Married    Vitals:   04/12/24 1347  BP: 118/76  Pulse: 76  Resp: 16  SpO2: 98%   Body mass index is 23.5 kg/m.  Physical Exam Vitals and nursing note reviewed.  Constitutional:      General: He is not in acute distress.    Appearance: He is well-developed.  HENT:     Head: Normocephalic and atraumatic.  Eyes:     Conjunctiva/sclera: Conjunctivae normal.  Cardiovascular:     Rate and Rhythm: Normal rate and regular rhythm.     Heart sounds: No murmur heard.    Comments: PT pulses palpable. Pulmonary:     Effort: Pulmonary effort is normal. No respiratory distress.     Breath sounds: Normal breath sounds.  Abdominal:     Palpations: Abdomen is soft. There is no mass.     Tenderness: There is no abdominal tenderness.  Musculoskeletal:     Lumbar back: Normal. No tenderness or bony tenderness. Negative right straight leg raise test and negative left straight leg raise test.     Right knee: Crepitus present. No effusion. Decreased range of motion.     Left knee: Crepitus present. No effusion. Decreased range of motion.     Right lower leg: No edema.     Left lower leg: No edema.     Comments: Mild limitation of knee full extension, no pain elicited, bilateral.  Skin:    General: Skin is warm.     Findings: No erythema or rash.  Neurological:     General: No focal deficit present.     Mental Status: He is alert. Mental status is at baseline. He is disoriented.     Gait: Gait normal.     Comments: Patellar DTR's 1-2+, symmetric. No weakness in right leg, able to bare wt on it while getting onto exam table. LE strength 5/5,bilateral. Gait assisted with a walker.  Psychiatric:        Mood and Affect: Mood and affect normal.    ASSESSMENT AND PLAN: [name] was seen here today for Right Leg Dragging.   Orders Placed This Encounter  Procedures   Basic metabolic panel with GFR    Unstable gait Family concerned about dragging RLE for the past 2-3 weeks, I do not appreciate this during visit today. Gait assisted with a walker and at his baseline. No focal deficit of LE 's noted today. We discussed differential Dx, including CVA. Given the fact he is at his baseline and problem noted 2-3 weeks ago, I do not think brain imaging is needed at this time. Radiculopathy is also to consider, he has not c/o back pain. No hx of trauma and mobility at baseline with  no antalgic gait, so I do not think back or LE imaging is needed. Monitor for changes. Fall precautions. Clearly instructed about warning signs.  -     Basic metabolic panel with GFR; Future  CEREBROVASCULAR ACCIDENT, HX OF Residual aphasia. Currently on Plavix  75 mg daily and Atorvastatin  80 mg daily. Follows with neurologist.  Moderate dementia with behavioral disturbance [facility] Assessment & Plan: Family provides hx and they have not noted changes in MS,changes in activities or appetite. Namenda  10 mg bid and Aricept  10 mg daily. He follows with neurologist.  Return in about 3 months (around 07/12/2024) for chronic problems.  I,Emily Lagle,acting as a Neurosurgeon for Kiernan Farkas [place], MD.,have documented all relevant documentation on the behalf of Fread Kottke [place], MD,as directed by  Tesha Archambeau [place], MD while in the presence of Rockford Leinen [place], MD.  I, Pattye Meda [place], MD, have reviewed all documentation for this visit. The documentation on 04/12/24 for the exam, diagnosis, procedures, and orders are all accurate and complete.  Shaurya Rawdon G. [place], MD  [facility]. [place] office.

## 2024-04-13 ENCOUNTER — Telehealth: Payer: Self-pay | Admitting: *Deleted

## 2024-04-13 NOTE — Assessment & Plan Note (Addendum)
 Family provides hx and they have not noted changes in MS,changes in activities or appetite. Namenda  10 mg bid and Aricept  10 mg daily. He follows with neurologist.

## 2024-04-13 NOTE — Telephone Encounter (Signed)
 Copied from CRM [id number]. Topic: Clinical - Medication Question >> Apr 13, 2024  1:56 PM Roselie BROCKS wrote: Reason for CRM: Patient believes he is on 2 medications for his diabetes, patient is asking if he should come off of the Ozempic and stay on a different one.

## 2024-04-14 NOTE — Telephone Encounter (Signed)
 Glipizide  XL from 10 mg to 5 mg (1/2 tab), instructed family not crushing tab. Continue Ozempic 0.5 mg weekly.

## 2024-04-14 NOTE — Telephone Encounter (Signed)
 I spoke with patient's wife, she is aware of the message below.

## 2024-05-07 ENCOUNTER — Other Ambulatory Visit: Payer: Self-pay | Admitting: Adult Health

## 2024-05-07 ENCOUNTER — Other Ambulatory Visit: Payer: Self-pay | Admitting: Physician Assistant

## 2024-05-07 DIAGNOSIS — E890 Postprocedural hypothyroidism: Secondary | ICD-10-CM

## 2024-05-11 ENCOUNTER — Other Ambulatory Visit: Payer: Self-pay

## 2024-05-11 ENCOUNTER — Encounter (HOSPITAL_COMMUNITY): Payer: Self-pay

## 2024-05-11 ENCOUNTER — Inpatient Hospital Stay (HOSPITAL_COMMUNITY)
Admission: EM | Admit: 2024-05-11 | Discharge: 2024-05-16 | DRG: 193 | Disposition: A | Discharge status: Home Health | Attending: Internal Medicine | Admitting: Internal Medicine

## 2024-05-11 ENCOUNTER — Emergency Department (HOSPITAL_COMMUNITY)

## 2024-05-11 ENCOUNTER — Ambulatory Visit: Payer: Self-pay

## 2024-05-11 DIAGNOSIS — Z23 Encounter for immunization: Secondary | ICD-10-CM | POA: Diagnosis not present

## 2024-05-11 DIAGNOSIS — G9341 Metabolic encephalopathy: Secondary | ICD-10-CM | POA: Diagnosis not present

## 2024-05-11 DIAGNOSIS — E1122 Type 2 diabetes mellitus with diabetic chronic kidney disease: Secondary | ICD-10-CM | POA: Diagnosis present

## 2024-05-11 DIAGNOSIS — N401 Enlarged prostate with lower urinary tract symptoms: Secondary | ICD-10-CM | POA: Diagnosis not present

## 2024-05-11 DIAGNOSIS — F32A Depression, unspecified: Secondary | ICD-10-CM | POA: Diagnosis present

## 2024-05-11 DIAGNOSIS — Z87891 Personal history of nicotine dependence: Secondary | ICD-10-CM

## 2024-05-11 DIAGNOSIS — N390 Urinary tract infection, site not specified: Secondary | ICD-10-CM | POA: Diagnosis not present

## 2024-05-11 DIAGNOSIS — Z9842 Cataract extraction status, left eye: Secondary | ICD-10-CM

## 2024-05-11 DIAGNOSIS — E785 Hyperlipidemia, unspecified: Secondary | ICD-10-CM | POA: Diagnosis present

## 2024-05-11 DIAGNOSIS — F03C4 Unspecified dementia, severe, with anxiety: Secondary | ICD-10-CM | POA: Diagnosis not present

## 2024-05-11 DIAGNOSIS — Z1152 Encounter for screening for COVID-19: Secondary | ICD-10-CM | POA: Diagnosis not present

## 2024-05-11 DIAGNOSIS — R4182 Altered mental status, unspecified: Principal | ICD-10-CM

## 2024-05-11 DIAGNOSIS — J9811 Atelectasis: Secondary | ICD-10-CM | POA: Diagnosis not present

## 2024-05-11 DIAGNOSIS — Z8249 Family history of ischemic heart disease and other diseases of the circulatory system: Secondary | ICD-10-CM | POA: Diagnosis not present

## 2024-05-11 DIAGNOSIS — Z833 Family history of diabetes mellitus: Secondary | ICD-10-CM

## 2024-05-11 DIAGNOSIS — Z87440 Personal history of urinary (tract) infections: Secondary | ICD-10-CM

## 2024-05-11 DIAGNOSIS — F03C3 Unspecified dementia, severe, with mood disturbance: Secondary | ICD-10-CM | POA: Diagnosis not present

## 2024-05-11 DIAGNOSIS — Z7902 Long term (current) use of antithrombotics/antiplatelets: Secondary | ICD-10-CM

## 2024-05-11 DIAGNOSIS — J9601 Acute respiratory failure with hypoxia: Secondary | ICD-10-CM | POA: Diagnosis not present

## 2024-05-11 DIAGNOSIS — F03C18 Unspecified dementia, severe, with other behavioral disturbance: Secondary | ICD-10-CM | POA: Diagnosis not present

## 2024-05-11 DIAGNOSIS — Z79890 Hormone replacement therapy (postmenopausal): Secondary | ICD-10-CM

## 2024-05-11 DIAGNOSIS — Z9841 Cataract extraction status, right eye: Secondary | ICD-10-CM

## 2024-05-11 DIAGNOSIS — Z8679 Personal history of other diseases of the circulatory system: Secondary | ICD-10-CM

## 2024-05-11 DIAGNOSIS — N400 Enlarged prostate without lower urinary tract symptoms: Secondary | ICD-10-CM | POA: Diagnosis present

## 2024-05-11 DIAGNOSIS — R319 Hematuria, unspecified: Secondary | ICD-10-CM | POA: Diagnosis not present

## 2024-05-11 DIAGNOSIS — E039 Hypothyroidism, unspecified: Secondary | ICD-10-CM | POA: Diagnosis present

## 2024-05-11 DIAGNOSIS — I10 Essential (primary) hypertension: Secondary | ICD-10-CM | POA: Diagnosis not present

## 2024-05-11 DIAGNOSIS — N183 Chronic kidney disease, stage 3 unspecified: Secondary | ICD-10-CM | POA: Diagnosis present

## 2024-05-11 DIAGNOSIS — Z888 Allergy status to other drugs, medicaments and biological substances status: Secondary | ICD-10-CM

## 2024-05-11 DIAGNOSIS — Z79899 Other long term (current) drug therapy: Secondary | ICD-10-CM | POA: Diagnosis not present

## 2024-05-11 DIAGNOSIS — R0989 Other specified symptoms and signs involving the circulatory and respiratory systems: Secondary | ICD-10-CM | POA: Diagnosis not present

## 2024-05-11 DIAGNOSIS — R32 Unspecified urinary incontinence: Secondary | ICD-10-CM | POA: Diagnosis present

## 2024-05-11 DIAGNOSIS — J189 Pneumonia, unspecified organism: Principal | ICD-10-CM | POA: Diagnosis present

## 2024-05-11 DIAGNOSIS — R310 Gross hematuria: Secondary | ICD-10-CM | POA: Diagnosis present

## 2024-05-11 DIAGNOSIS — E860 Dehydration: Secondary | ICD-10-CM | POA: Diagnosis present

## 2024-05-11 DIAGNOSIS — Z823 Family history of stroke: Secondary | ICD-10-CM

## 2024-05-11 DIAGNOSIS — I700 Atherosclerosis of aorta: Secondary | ICD-10-CM | POA: Diagnosis not present

## 2024-05-11 DIAGNOSIS — I129 Hypertensive chronic kidney disease with stage 1 through stage 4 chronic kidney disease, or unspecified chronic kidney disease: Secondary | ICD-10-CM | POA: Diagnosis present

## 2024-05-11 DIAGNOSIS — R918 Other nonspecific abnormal finding of lung field: Secondary | ICD-10-CM | POA: Diagnosis not present

## 2024-05-11 DIAGNOSIS — I959 Hypotension, unspecified: Secondary | ICD-10-CM | POA: Diagnosis present

## 2024-05-11 DIAGNOSIS — E1159 Type 2 diabetes mellitus with other circulatory complications: Secondary | ICD-10-CM

## 2024-05-11 DIAGNOSIS — E119 Type 2 diabetes mellitus without complications: Secondary | ICD-10-CM

## 2024-05-11 DIAGNOSIS — Z8673 Personal history of transient ischemic attack (TIA), and cerebral infarction without residual deficits: Secondary | ICD-10-CM

## 2024-05-11 DIAGNOSIS — Z7985 Long-term (current) use of injectable non-insulin antidiabetic drugs: Secondary | ICD-10-CM

## 2024-05-11 DIAGNOSIS — F03B18 Unspecified dementia, moderate, with other behavioral disturbance: Secondary | ICD-10-CM | POA: Diagnosis present

## 2024-05-11 DIAGNOSIS — Z7984 Long term (current) use of oral hypoglycemic drugs: Secondary | ICD-10-CM | POA: Diagnosis not present

## 2024-05-11 DIAGNOSIS — R011 Cardiac murmur, unspecified: Secondary | ICD-10-CM | POA: Diagnosis present

## 2024-05-11 DIAGNOSIS — R0902 Hypoxemia: Secondary | ICD-10-CM | POA: Diagnosis present

## 2024-05-11 LAB — CBG MONITORING, ED
Glucose-Capillary: 144 mg/dL — ABNORMAL HIGH (ref 70–99)
Glucose-Capillary: 211 mg/dL — ABNORMAL HIGH (ref 70–99)
Glucose-Capillary: 216 mg/dL — ABNORMAL HIGH (ref 70–99)

## 2024-05-11 LAB — COMPREHENSIVE METABOLIC PANEL WITH GFR
ALT: 16 U/L (ref 0–44)
AST: 19 U/L (ref 15–41)
Albumin: 3.6 g/dL (ref 3.5–5.0)
Alkaline Phosphatase: 121 U/L (ref 38–126)
Anion gap: 13 (ref 5–15)
BUN: 17 mg/dL (ref 8–23)
CO2: 25 mmol/L (ref 22–32)
Calcium: 9.1 mg/dL (ref 8.9–10.3)
Chloride: 97 mmol/L — ABNORMAL LOW (ref 98–111)
Creatinine, Ser: 1.49 mg/dL — ABNORMAL HIGH (ref 0.61–1.24)
GFR, Estimated: 46 mL/min — ABNORMAL LOW (ref >60)
Glucose, Bld: 152 mg/dL — ABNORMAL HIGH (ref 70–99)
Potassium: 4 mmol/L (ref 3.5–5.1)
Sodium: 135 mmol/L (ref 135–145)
Total Bilirubin: 1 mg/dL (ref 0.0–1.2)
Total Protein: 6.7 g/dL (ref 6.5–8.1)

## 2024-05-11 LAB — RESP PANEL BY RT-PCR (RSV, FLU A&B, COVID)  RVPGX2
Influenza A by PCR: NEGATIVE
Influenza B by PCR: NEGATIVE
Resp Syncytial Virus by PCR: NEGATIVE
SARS Coronavirus 2 by RT PCR: NEGATIVE

## 2024-05-11 LAB — CBC
HCT: 35.1 % — ABNORMAL LOW (ref 39.0–52.0)
Hemoglobin: 11.2 g/dL — ABNORMAL LOW (ref 13.0–17.0)
MCH: 29 pg (ref 26.0–34.0)
MCHC: 31.9 g/dL (ref 30.0–36.0)
MCV: 90.9 fL (ref 80.0–100.0)
Platelets: 229 K/uL (ref 150–400)
RBC: 3.86 MIL/uL — ABNORMAL LOW (ref 4.22–5.81)
RDW: 14.2 % (ref 11.5–15.5)
WBC: 18.1 K/uL — ABNORMAL HIGH (ref 4.0–10.5)
nRBC: 0 % (ref 0.0–0.2)

## 2024-05-11 LAB — URINALYSIS, ROUTINE W REFLEX MICROSCOPIC
Bilirubin Urine: NEGATIVE
Glucose, UA: NEGATIVE mg/dL
Ketones, ur: NEGATIVE mg/dL
Nitrite: NEGATIVE
Protein, ur: 100 mg/dL — AB
Specific Gravity, Urine: 1.026 (ref 1.005–1.030)
WBC, UA: >50 WBC/hpf (ref 0–5)
pH: 5 (ref 5.0–8.0)

## 2024-05-11 LAB — I-STAT CG4 LACTIC ACID, ED: Lactic Acid, Venous: 1.3 mmol/L (ref 0.5–1.9)

## 2024-05-11 MED ORDER — INSULIN ASPART 100 UNIT/ML IJ SOLN
0.0000 [IU] | Freq: Three times a day (TID) | INTRAMUSCULAR | Status: DC
  Administered 2024-05-12: 5 [IU] via SUBCUTANEOUS
  Administered 2024-05-13 (×2): 2 [IU] via SUBCUTANEOUS
  Administered 2024-05-14: 3 [IU] via SUBCUTANEOUS
  Administered 2024-05-14 – 2024-05-15 (×2): 2 [IU] via SUBCUTANEOUS
  Administered 2024-05-15: 3 [IU] via SUBCUTANEOUS
  Administered 2024-05-15 – 2024-05-16 (×2): 1 [IU] via SUBCUTANEOUS

## 2024-05-11 MED ORDER — CITALOPRAM HYDROBROMIDE 20 MG PO TABS
20.0000 mg | ORAL_TABLET | Freq: Every day | ORAL | Status: DC
  Administered 2024-05-12 – 2024-05-16 (×5): 20 mg via ORAL
  Filled 2024-05-11 (×5): qty 1

## 2024-05-11 MED ORDER — ATORVASTATIN CALCIUM 80 MG PO TABS
80.0000 mg | ORAL_TABLET | Freq: Every day | ORAL | Status: DC
  Administered 2024-05-12 – 2024-05-16 (×5): 80 mg via ORAL
  Filled 2024-05-11 (×5): qty 1

## 2024-05-11 MED ORDER — AMLODIPINE BESYLATE 5 MG PO TABS
2.5000 mg | ORAL_TABLET | Freq: Every day | ORAL | Status: DC
Start: 2024-05-12 — End: 2024-05-16
  Administered 2024-05-12 – 2024-05-16 (×5): 2.5 mg via ORAL
  Filled 2024-05-11 (×5): qty 1

## 2024-05-11 MED ORDER — ENOXAPARIN SODIUM 40 MG/0.4ML IJ SOSY: 40.0000 mg | PREFILLED_SYRINGE | INTRAMUSCULAR | Status: DC

## 2024-05-11 MED ORDER — SODIUM CHLORIDE 0.9 % IV BOLUS
1000.0000 mL | Freq: Once | INTRAVENOUS | Status: AC
  Administered 2024-05-11: 1000 mL via INTRAVENOUS

## 2024-05-11 MED ORDER — SODIUM CHLORIDE 0.9 % IV SOLN
500.0000 mg | Freq: Once | INTRAVENOUS | Status: AC
  Administered 2024-05-11: 500 mg via INTRAVENOUS
  Filled 2024-05-11: qty 5

## 2024-05-11 MED ORDER — SODIUM CHLORIDE 0.9 % IV SOLN: INTRAVENOUS | Status: DC

## 2024-05-11 MED ORDER — LEVOTHYROXINE SODIUM 50 MCG PO TABS
50.0000 ug | ORAL_TABLET | Freq: Every day | ORAL | Status: DC
  Administered 2024-05-12 – 2024-05-16 (×5): 50 ug via ORAL
  Filled 2024-05-11 (×5): qty 1

## 2024-05-11 MED ORDER — SODIUM CHLORIDE 0.9 % IV SOLN: 500.0000 mg | INTRAVENOUS | Status: DC

## 2024-05-11 MED ORDER — DONEPEZIL HCL 10 MG PO TABS
10.0000 mg | ORAL_TABLET | Freq: Every day | ORAL | Status: DC
  Administered 2024-05-11 – 2024-05-15 (×5): 10 mg via ORAL
  Filled 2024-05-11 (×5): qty 1

## 2024-05-11 MED ORDER — MEMANTINE HCL 10 MG PO TABS
10.0000 mg | ORAL_TABLET | Freq: Two times a day (BID) | ORAL | Status: DC
  Administered 2024-05-11 – 2024-05-16 (×10): 10 mg via ORAL
  Filled 2024-05-11 (×10): qty 1

## 2024-05-11 MED ORDER — CLOPIDOGREL BISULFATE 75 MG PO TABS: 75.0000 mg | ORAL_TABLET | Freq: Every day | ORAL | Status: DC

## 2024-05-11 MED ORDER — SODIUM CHLORIDE 0.9 % IV SOLN
2.0000 g | INTRAVENOUS | Status: DC
  Administered 2024-05-12 – 2024-05-15 (×4): 2 g via INTRAVENOUS
  Filled 2024-05-11 (×4): qty 20

## 2024-05-11 MED ORDER — SODIUM CHLORIDE 0.9 % IV SOLN
2.0000 g | Freq: Once | INTRAVENOUS | Status: AC
  Administered 2024-05-11: 2 g via INTRAVENOUS
  Filled 2024-05-11: qty 20

## 2024-05-11 MED ORDER — INSULIN ASPART 100 UNIT/ML IJ SOLN
0.0000 [IU] | Freq: Every day | INTRAMUSCULAR | Status: DC
  Administered 2024-05-11: 2 [IU] via SUBCUTANEOUS
  Administered 2024-05-12: 3 [IU] via SUBCUTANEOUS

## 2024-05-11 NOTE — H&P (Signed)
 History and Physical    Patient: Scott Vasquez FMW:[id number] DOB: 21-Oct-1940 DOA: 05/11/2024 DOS: the patient was seen and examined on 05/11/2024 PCP: [place], Betty G, MD  Patient coming from: Home  Chief Complaint:  Chief Complaint  Patient presents with   AMS   Hypotension   HPI: Scott Vasquez is a 83 y.o. male with medical history significant of type 2 diabetes non-insulin -dependent, essential hypertension, diverticulosis, history of CVA, iron deficiency anemia who was brought in by the son secondary to altered mental status and hypotension.  Patient started having low blood pressure with fatigue lethargy and confusion around noon today.  Patient took his blood pressure medications as usual.  Blood pressure was apparently 108/67 at the time.  But then he was noted to be nodding off and not waking up as he used to be.  He did not even finish his meal.  He was brought to the ER therefore for evaluation.  In the ER patient arrived with initial oxygen saturation of 79% on room air.  Noted to have leukocytosis systolic of 99.  Urinalysis consistent with UTI.  Chest x-ray also consistent with possible pneumonia.  He does not meet sepsis criteria but appears to have significant hypotension, generalized weakness and malaise secondary to UTI and early pneumonia.  Patient therefore is being admitted to the hospital for evaluation and treatment.  Review of Systems: As mentioned in the history of present illness. All other systems reviewed and are negative. Past Medical History:  Diagnosis Date   Anemia    iron deficiency   Diabetes mellitus    type II   Diverticulosis of colon    Hypertension    Stroke (HCC)    numbness in left hand   Thyroid  nodule    Past Surgical History:  Procedure Laterality Date   BACK SURGERY     CATARACT EXTRACTION, BILATERAL     SHOULDER SURGERY  08/2007   Supple   THYROID  LOBECTOMY Left 04/29/2015   THYROID  LOBECTOMY Left 04/29/2015   Procedure:  THYROID  LOBECTOMY;  Surgeon: Krystal Spinner, MD;  Location: [facility] OR;  Service: General;  Laterality: Left;   Social History:  reports that he quit smoking about 65 years ago. His smoking use included cigarettes. He started smoking about 72 years ago. He has a 10 pack-year smoking history. He has never used smokeless tobacco. He reports that he does not drink alcohol  and does not use drugs.  Allergies  Allergen Reactions   Actos  [Pioglitazone ] Other (See Comments)    Diarrhea    Metformin  And Related Other (See Comments)    Diarrhea      Family History  Problem Relation Age of Onset   Diabetes Other    Diabetes Father        Died from diabetic coma in 06/07/1971   Hypertension Father    Stroke Mother        Died in 64   Hypertension Mother     Prior to Admission medications   Medication Sig Start Date End Date Taking? Authorizing Provider  ACCU-CHEK AVIVA PLUS test strip TEST BLOOD SUGAR TWICE DAILY 03/17/22   Nafziger, Darleene, NP  amLODipine  (NORVASC ) 2.5 MG tablet Take 1 tablet (2.5 mg total) by mouth daily. 03/03/24 03/03/25  [place], Betty G, MD  atorvastatin  (LIPITOR ) 80 MG tablet Take 1 tablet (80 mg total) by mouth daily. 04/11/24   [place], Betty G, MD  Blood Glucose Monitoring Suppl (ACCU-CHEK GUIDE) w/Device KIT USE AS DIRECTED 03/17/22  Nafziger, Darleene, NP  citalopram  (CELEXA ) 20 MG tablet TAKE 1 TABLET EVERY DAY 05/08/24   Wertman, Sara E, PA-C  clopidogrel  (PLAVIX ) 75 MG tablet Take 1 tablet (75 mg total) by mouth daily. 03/03/24   [place], Betty G, MD  donepezil  (ARICEPT ) 10 MG tablet TAKE 1 TABLET AT BEDTIME 05/08/24   Wertman, Sara E, PA-C  glipiZIDE  (GLUCOTROL  XL) 10 MG 24 hr tablet TAKE 1 TABLET EVERY DAY WITH BREAKFAST 10/01/23   Nafziger, Darleene, NP  levothyroxine  (SYNTHROID ) 50 MCG tablet TAKE 1 TABLET EVERY DAY 07/20/23   Nafziger, Darleene, NP  memantine  (NAMENDA ) 10 MG tablet TAKE 1 TABLET TWICE DAILY 05/08/24   Wertman, Sara E, PA-C  Semaglutide (OZEMPIC, 0.25 OR 0.5 MG/DOSE, [place]) Inject  0.5 mg into the skin once a week. Waiting to start once PAP product arrives    [provider]    Physical Exam: Vitals:   05/11/24 1915 05/11/24 1950 05/11/24 2030 05/11/24 2200  BP: 103/60 117/64 110/60 (!) 110/59  Pulse:  75 77 79  Resp: 14 14 18 19   Temp:      SpO2:  100% 100% 100%   Constitutional: Confused, NAD, calm, comfortable Eyes: PERRL, lids and conjunctivae normal ENMT: Mucous membranes are dry. Posterior pharynx clear of any exudate or lesions.Normal dentition.  Neck: normal, supple, no masses, no thyromegaly Respiratory: Coarse breath sounds bilaterally, no wheezing, no crackles. Normal respiratory effort. No accessory muscle use.  Cardiovascular: Regular rate and rhythm, no murmurs / rubs / gallops. No extremity edema. 2+ pedal pulses. No carotid bruits.  Abdomen: no tenderness, no masses palpated. No hepatosplenomegaly. Bowel sounds positive.  Musculoskeletal: Good range of motion, no joint swelling or tenderness, Skin: no rashes, lesions, ulcers. No induration Neurologic: CN 2-12 grossly intact. Sensation intact, DTR normal. Strength 5/5 in all 4.  Psychiatric: Drowsy, mildly confused  Data Reviewed:  Temperature 98.6 blood pressure 99/53 pulse 79 respiratory rate 19 oxygen sat 79% on room air.  White count is 18.1 hemoglobin 11.2.  Chloride 97 with creatinine 1.49. Acute viral screen is negative for COVID influenza and RSV.  Urinalysis showed cloudy urine with moderate leukocytes.  Many bacteria with WBC more than 50.  Chest x-ray showed mild infiltrate at the left base  Assessment and Plan:  #1 acute metabolic encephalopathy and hypotension: Most likely secondary to hypoxia from pneumonia and also UTI.  Patient will be admitted.  Initiate empiric treatment with antibiotics and fluids.  Continue close monitoring.  #2  Acute hypoxic respiratory failure due to community-acquired pneumonia: Started on Rocephin and Zithromax.  Cultures obtained and will  follow.  #3 UTI: Urine cultures obtained.  Also blood cultures.  Continue to monitor  #4 history of urinary incontinence: Continue chronic home regimen.  #5 hypothyroidism: Continue levothyroxine .  #6 hypotension: Appears improved.  Probably dehydration.  Patient does not meet sepsis criteria.  I doubt septic shock therefore.  #7 essential hypertension: Hold antihypertensive due to hypotension.  #8 type 2 diabetes: Non-insulin -dependent.  Hold glipizide .  Sliding scale insulin   #9 moderate dementia: No agitation.  #10 hyperlipidemia: Continue statin    Advance Care Planning:   Code Status: Full Code   Consults: None  Family Communication: Son at bedside  Severity of Illness: The appropriate patient status for this patient is INPATIENT. Inpatient status is judged to be reasonable and necessary in order to provide the required intensity of service to ensure the patient's safety. The patient's presenting symptoms, physical exam findings, and initial radiographic and laboratory  data in the context of their chronic comorbidities is felt to place them at high risk for further clinical deterioration. Furthermore, it is not anticipated that the patient will be medically stable for discharge from the hospital within 2 midnights of admission.   * I certify that at the point of admission it is my clinical judgment that the patient will require inpatient hospital care spanning beyond 2 midnights from the point of admission due to high intensity of service, high risk for further deterioration and high frequency of surveillance required.*  AuthorBETHA SIM KNOLL, MD 05/11/2024 10:17 PM  For on call review [email or web address].

## 2024-05-11 NOTE — Telephone Encounter (Signed)
 Copied from CRM [id number]. Topic: Clinical - Red Word Triage >> May 11, 2024  3:20 PM Rea C wrote: Red Word that prompted transfer to Nurse Triage: Blood pressure  85/54; and pulse is 75; patient is very weak. Answer Assessment - Initial Assessment Questions 1. BLOOD PRESSURE: What is your blood pressure? Did you take at least two measurements 5 minutes apart?     108/67 hr 74; now, pt's wife reports current bp 2. ONSET: When did you take your blood pressure?     Started at 1200 today   Pt's wife reports patient is nodding off and not waking up as he use to, he didn't finish meal. Nurse asked patient's wife to wake patient and call name; pt's wife said is he is up.Nurse did not hear patient's voice. Nurse offered to call 911, pt's wife declined and reports my son will take him, let me call him first, please don't call. Nurse again offered to call and pt's wife declined and said let me call my son first, then I'll call you back. Patient's wife disconnected call.  Protocols used: Blood Pressure - Low-A-AH

## 2024-05-11 NOTE — Telephone Encounter (Signed)
 Pt's son called in asking if we could assist with transportation. RN advised could call 911 for patient. He stated unfortunately they don't want him to go to the closest hospital they want him to go to [facility]. Rn advised unfortunately 911 will take him to the closest hospital. Pts son stated understanding.

## 2024-05-11 NOTE — ED Triage Notes (Signed)
 Patient bib son via POV with complaints on hypotension, fatigue, lethargy and confusion. Symptoms started around noon today.

## 2024-05-11 NOTE — ED Notes (Signed)
 Pt provided with urine cup. Aware we need urine sample. Unable to urinate at this time

## 2024-05-11 NOTE — ED Notes (Signed)
 CCMD contacted to place pt cardiac monitoring.

## 2024-05-11 NOTE — ED Notes (Signed)
 Scott Vasquez (son) [phone].

## 2024-05-11 NOTE — ED Provider Notes (Signed)
 [facility] AT [place] Provider Note   CSN: [id number] Arrival date & time: 05/11/24  1743     Patient presents with: AMS and Hypotension   Scott Vasquez is a 83 y.o. male.  With a history of dementia, stroke, CKD who presents to the ED for altered mental status.  Beginning around noon today patient's son noted him to be exhibiting signs of increased global confusion and fatigue.  He was noted to be hypotensive at home.  No fevers chills or overt infectious symptoms.  Patient denies headache chest pain abdominal pain.  Son notes similar presentations with UTIs in the past   HPI     Prior to Admission medications   Medication Sig Start Date End Date Taking? Authorizing Provider  ACCU-CHEK AVIVA PLUS test strip TEST BLOOD SUGAR TWICE DAILY 03/17/22   Nafziger, Darleene, NP  amLODipine  (NORVASC ) 2.5 MG tablet Take 1 tablet (2.5 mg total) by mouth daily. 03/03/24 03/03/25  [place], Betty G, MD  atorvastatin  (LIPITOR ) 80 MG tablet Take 1 tablet (80 mg total) by mouth daily. 04/11/24   [place], Betty G, MD  Blood Glucose Monitoring Suppl (ACCU-CHEK GUIDE) w/Device KIT USE AS DIRECTED 03/17/22   Nafziger, Darleene, NP  citalopram  (CELEXA ) 20 MG tablet TAKE 1 TABLET EVERY DAY 05/08/24   Wertman, Sara E, PA-C  clopidogrel  (PLAVIX ) 75 MG tablet Take 1 tablet (75 mg total) by mouth daily. 03/03/24   [place], Betty G, MD  donepezil  (ARICEPT ) 10 MG tablet TAKE 1 TABLET AT BEDTIME 05/08/24   Wertman, Sara E, PA-C  glipiZIDE  (GLUCOTROL  XL) 10 MG 24 hr tablet TAKE 1 TABLET EVERY DAY WITH BREAKFAST 10/01/23   Nafziger, Darleene, NP  levothyroxine  (SYNTHROID ) 50 MCG tablet TAKE 1 TABLET EVERY DAY 07/20/23   Nafziger, Darleene, NP  memantine  (NAMENDA ) 10 MG tablet TAKE 1 TABLET TWICE DAILY 05/08/24   Wertman, Sara E, PA-C  Semaglutide (OZEMPIC, 0.25 OR 0.5 MG/DOSE, [place]) Inject 0.5 mg into the skin once a week. Waiting to start once PAP product arrives    [provider]    Allergies:  Actos  [pioglitazone ] and Metformin  and related    Review of Systems  Updated Vital Signs BP 121/72   Pulse 84   Temp 98.6 F (37 C) (Oral)   Resp 18   SpO2 100%   Physical Exam Vitals and nursing note reviewed.  HENT:     Head: Normocephalic and atraumatic.  Eyes:     Pupils: Pupils are equal, round, and reactive to light.  Cardiovascular:     Rate and Rhythm: Normal rate and regular rhythm.  Pulmonary:     Effort: Pulmonary effort is normal.     Breath sounds: Normal breath sounds.  Abdominal:     Palpations: Abdomen is soft.     Tenderness: There is no abdominal tenderness.  Skin:    General: Skin is warm and dry.  Neurological:     General: No focal deficit present.     Mental Status: He is alert.     Sensory: No sensory deficit.     Motor: No weakness.  Psychiatric:        Mood and Affect: Mood normal.     (all labs ordered are listed, but only abnormal results are displayed) Labs Reviewed  COMPREHENSIVE METABOLIC PANEL WITH GFR - Abnormal; Notable for the following components:      Result Value   Chloride 97 (*)    Glucose, Bld 152 (*)  Creatinine, Ser 1.49 (*)    GFR, Estimated 46 (*)    All other components within normal limits  CBC - Abnormal; Notable for the following components:   WBC 18.1 (*)    RBC 3.86 (*)    Hemoglobin 11.2 (*)    HCT 35.1 (*)    All other components within normal limits  URINALYSIS, ROUTINE W REFLEX MICROSCOPIC - Abnormal; Notable for the following components:   Color, Urine AMBER (*)    APPearance CLOUDY (*)    Hgb urine dipstick SMALL (*)    Protein, ur 100 (*)    Leukocytes,Ua MODERATE (*)    Bacteria, UA MANY (*)    All other components within normal limits  CBG MONITORING, ED - Abnormal; Notable for the following components:   Glucose-Capillary 144 (*)    All other components within normal limits  CBG MONITORING, ED - Abnormal; Notable for the following components:   Glucose-Capillary 211 (*)    All other  components within normal limits  CBG MONITORING, ED - Abnormal; Notable for the following components:   Glucose-Capillary 216 (*)    All other components within normal limits  RESP PANEL BY RT-PCR (RSV, FLU A&B, COVID)  RVPGX2  HIV ANTIBODY (ROUTINE TESTING W REFLEX)  CBC  CREATININE, SERUM  HEMOGLOBIN A1C  CBC  COMPREHENSIVE METABOLIC PANEL WITH GFR  I-STAT CG4 LACTIC ACID, ED  I-STAT CG4 LACTIC ACID, ED    EKG: EKG Interpretation Date/Time:  Thursday May 11 2024 18:29:07 EDT Ventricular Rate:  85 PR Interval:  156 QRS Duration:  86 QT Interval:  382 QTC Calculation: 454 R Axis:   68  Text Interpretation: Normal sinus rhythm Normal ECG When compared with ECG of 05-Nov-2023 22:55, PREVIOUS ECG IS PRESENT Confirmed by Pamella Sharper [phone]) on 05/11/2024 6:59:02 PM  Radiology: [facility] Chest Portable 1 View Result Date: 05/11/2024 CLINICAL DATA:  Hypotension fatigue lethargy EXAM: PORTABLE CHEST 1 VIEW COMPARISON:  01/01/2022 FINDINGS: Hypoventilatory change. Patchy atelectasis or mild infiltrate left lung base. Stable cardiomediastinal silhouette. No pneumothorax. Probable scarring at the right base. IMPRESSION: Hypoventilatory change with patchy atelectasis or mild infiltrate at the left base. Electronically Signed   By: Luke Bun M.D.   On: 05/11/2024 20:11     Procedures   Medications Ordered in the ED  amLODipine  (NORVASC ) tablet 2.5 mg (has no administration in time range)  atorvastatin  (LIPITOR ) tablet 80 mg (has no administration in time range)  citalopram  (CELEXA ) tablet 20 mg (has no administration in time range)  donepezil  (ARICEPT ) tablet 10 mg (10 mg Oral Given 05/11/24 2316)  memantine  (NAMENDA ) tablet 10 mg (10 mg Oral Given 05/11/24 2316)  levothyroxine  (SYNTHROID ) tablet 50 mcg (has no administration in time range)  clopidogrel  (PLAVIX ) tablet 75 mg (has no administration in time range)  0.9 %  sodium chloride  infusion ( Intravenous New Bag/Given 05/11/24  2320)  enoxaparin  (LOVENOX ) injection 40 mg (has no administration in time range)  cefTRIAXone (ROCEPHIN) 2 g in sodium chloride  0.9 % 100 mL IVPB (has no administration in time range)  azithromycin (ZITHROMAX) 500 mg in sodium chloride  0.9 % 250 mL IVPB (has no administration in time range)  insulin  aspart (novoLOG ) injection 0-9 Units (has no administration in time range)  insulin  aspart (novoLOG ) injection 0-5 Units (2 Units Subcutaneous Given 05/11/24 2316)  sodium chloride  0.9 % bolus 1,000 mL (0 mLs Intravenous Stopped 05/11/24 2026)  cefTRIAXone (ROCEPHIN) 2 g in sodium chloride  0.9 % 100 mL IVPB (0 g Intravenous  Stopped 05/11/24 1942)  azithromycin (ZITHROMAX) 500 mg in sodium chloride  0.9 % 250 mL IVPB (500 mg Intravenous New Bag/Given 05/11/24 2214)    Clinical Course as of 05/11/24 2333  Thu May 11, 2024  2212 hosp [MP]    Clinical Course User Index [MP] Pamella Ozell LABOR, DO                                 Medical Decision Making 83 year old male with history as above presented to the ED for altered mental status reported hypotension at home.  Afebrile well-appearing.  He is not on oxygen at home but has required supplemental oxygen 2 L given hypoxia noted earlier on.  No focal neurologic deficits.  No abdominal tenderness.  Laboratory workup notable for leukocytosis UTI.  Chest x-ray suggestive of potential left-sided pneumonia.  Provided with IV fluids and pressure remained stable.  No elevation in his lactic.  Covered with ceftriaxone and azithromycin.  Will admit to medicine for altered mental status suspected due to acute infectious processes of UTI ammonia.  Discussed with admitting hospitalist accepts patient for admission.  Amount and/or Complexity of Data Reviewed Labs: ordered. Radiology: ordered.  Risk Decision regarding hospitalization.        Final diagnoses:  Altered mental status, unspecified altered mental status type  Community acquired pneumonia,  unspecified laterality  Lower urinary tract infectious disease  History of stroke    ED Discharge Orders     None          Pamella Ozell LABOR, DO 05/11/24 2333

## 2024-05-12 ENCOUNTER — Inpatient Hospital Stay (HOSPITAL_COMMUNITY)

## 2024-05-12 DIAGNOSIS — J9601 Acute respiratory failure with hypoxia: Secondary | ICD-10-CM | POA: Diagnosis not present

## 2024-05-12 LAB — COMPREHENSIVE METABOLIC PANEL WITH GFR
ALT: 16 U/L (ref 0–44)
AST: 22 U/L (ref 15–41)
Albumin: 3.4 g/dL — ABNORMAL LOW (ref 3.5–5.0)
Alkaline Phosphatase: 107 U/L (ref 38–126)
Anion gap: 8 (ref 5–15)
BUN: 14 mg/dL (ref 8–23)
CO2: 26 mmol/L (ref 22–32)
Calcium: 8.7 mg/dL — ABNORMAL LOW (ref 8.9–10.3)
Chloride: 103 mmol/L (ref 98–111)
Creatinine, Ser: 1.22 mg/dL (ref 0.61–1.24)
GFR, Estimated: 58 mL/min — ABNORMAL LOW (ref >60)
Glucose, Bld: 129 mg/dL — ABNORMAL HIGH (ref 70–99)
Potassium: 3.7 mmol/L (ref 3.5–5.1)
Sodium: 137 mmol/L (ref 135–145)
Total Bilirubin: 0.6 mg/dL (ref 0.0–1.2)
Total Protein: 6.7 g/dL (ref 6.5–8.1)

## 2024-05-12 LAB — HEMOGLOBIN A1C
Hgb A1c MFr Bld: 5.4 % (ref 4.8–5.6)
Mean Plasma Glucose: 108.28 mg/dL

## 2024-05-12 LAB — PSA: Prostatic Specific Antigen: 32.6 ng/mL — ABNORMAL HIGH (ref 0.00–4.00)

## 2024-05-12 LAB — GLUCOSE, CAPILLARY
Glucose-Capillary: 101 mg/dL — ABNORMAL HIGH (ref 70–99)
Glucose-Capillary: 92 mg/dL (ref 70–99)
Glucose-Capillary: 237 mg/dL — ABNORMAL HIGH (ref 70–99)
Glucose-Capillary: 285 mg/dL — ABNORMAL HIGH (ref 70–99)

## 2024-05-12 LAB — URINALYSIS, ROUTINE W REFLEX MICROSCOPIC
Bacteria, UA: NONE SEEN
Bilirubin Urine: NEGATIVE
Glucose, UA: 50 mg/dL — AB
Ketones, ur: NEGATIVE mg/dL
Leukocytes,Ua: NEGATIVE
Nitrite: NEGATIVE
Protein, ur: 100 mg/dL — AB
RBC / HPF: >50 RBC/hpf (ref 0–5)
Specific Gravity, Urine: 1.019 (ref 1.005–1.030)
pH: 5 (ref 5.0–8.0)

## 2024-05-12 LAB — PROTEIN / CREATININE RATIO, URINE
Creatinine, Urine: 117 mg/dL
Protein Creatinine Ratio: 0.45 mg/mg{creat} — ABNORMAL HIGH (ref 0.00–0.15)
Total Protein, Urine: 53 mg/dL

## 2024-05-12 LAB — CBC
HCT: 33.4 % — ABNORMAL LOW (ref 39.0–52.0)
Hemoglobin: 10.6 g/dL — ABNORMAL LOW (ref 13.0–17.0)
MCH: 28.6 pg (ref 26.0–34.0)
MCHC: 31.7 g/dL (ref 30.0–36.0)
MCV: 90 fL (ref 80.0–100.0)
Platelets: 245 K/uL (ref 150–400)
RBC: 3.71 MIL/uL — ABNORMAL LOW (ref 4.22–5.81)
RDW: 14.2 % (ref 11.5–15.5)
WBC: 16.7 K/uL — ABNORMAL HIGH (ref 4.0–10.5)
nRBC: 0 % (ref 0.0–0.2)

## 2024-05-12 LAB — HIV ANTIBODY (ROUTINE TESTING W REFLEX): HIV Screen 4th Generation wRfx: NONREACTIVE

## 2024-05-12 MED ORDER — IOHEXOL 350 MG/ML SOLN
100.0000 mL | Freq: Once | INTRAVENOUS | Status: AC | PRN
  Administered 2024-05-12: 100 mL via INTRAVENOUS

## 2024-05-12 MED ORDER — INFLUENZA VAC SPLIT HIGH-DOSE 0.5 ML IM SUSY
0.5000 mL | PREFILLED_SYRINGE | INTRAMUSCULAR | Status: AC
  Administered 2024-05-13: 0.5 mL via INTRAMUSCULAR
  Filled 2024-05-12: qty 0.5

## 2024-05-12 MED ORDER — AZITHROMYCIN 500 MG PO TABS
500.0000 mg | ORAL_TABLET | Freq: Every day | ORAL | Status: AC
  Administered 2024-05-12 – 2024-05-15 (×4): 500 mg via ORAL
  Filled 2024-05-12 (×4): qty 1

## 2024-05-12 NOTE — Plan of Care (Signed)
  Problem: Coping: Goal: Ability to adjust to condition or change in health will improve Outcome: Progressing   Problem: Nutritional: Goal: Maintenance of adequate nutrition will improve Outcome: Progressing   Problem: Activity: Goal: Ability to tolerate increased activity will improve Outcome: Progressing

## 2024-05-12 NOTE — Progress Notes (Signed)
 New Admission Note:  Arrival Method: By bed from ED around 0200 Mental Orientation: Alert to self Telemetry: Box 1, CCMD notified Assessment: Completed Skin: Completed, refer to flowsheets IV: R AC NS @ 100 Pain: Denies Tubes: None Safety Measures: Safety Fall Prevention Plan was given, discussed and signed. Admission: Completed [facility] Orientation: Patient has been orientated to the room, unit and the staff. Family: None  Orders have been reviewed and implemented. Will continue to monitor the patient. Call light has been placed within reach and bed alarm has been activated.   Bari Lor, RN  Phone Number: [phone]

## 2024-05-12 NOTE — TOC CM/SW Note (Signed)
 Transition of Care [facility]) - Inpatient Brief Assessment   Patient Details  Name: Scott Vasquez MRN: [id number] Date of Birth: [birth year]  [facility]) CM/SW Contact:    Tom-Johnson, Harvest Muskrat, RN Phone Number: 05/12/2024, 11:23 AM   Clinical Narrative:  Patient presented to the ED with Altered Mental Status, Generalized Weakness, Fatigue and Lethargy. Found to have low Blood Pressure, Oxygen saturation of 79% on room air, Urinalysis consistent with UTI, Chest X-Ray showed Pneumonia in the ED. On IV and Oral abx. Patient admitted with Acute Hypoxemic Respiratory Failure/UTI. On 2L O2 acute, IV and Oral abx. Patient has hx of CVA, HTN, T2DM, HTN, Diverticulosis, Iron Deficiency Anemia.  CM spoke with son, Vaughan [phone]) about needs for post hospital transition. Patient pleasantly confused and not answering questions appropriately. Patient lives with his wife and daughter, has two supportive children. Moderately independent, needs assists with ADL's. Has a cane, walker, rollator at home.  PCP is [place], Betty G, MD and uses [employer] in [place].   Patient not Medically ready for discharge.  CM will continue to follow as patient progresses with care towards discharge.            Transition of Care Asessment: Insurance and Status: Insurance coverage has been reviewed Patient has primary care physician: Yes Home environment has been reviewed: Yes Prior level of function:: Moderate Assist Prior/Current Home Services: No current home services Social Drivers of Health Review: SDOH reviewed no interventions necessary Readmission risk has been reviewed: Yes Transition of care needs: transition of care needs identified, TOC will continue to follow

## 2024-05-12 NOTE — Consult Note (Signed)
 Urology Consult Note   Requesting Attending Physician:  Scott Leatrice FERNS, MD Service Providing Consult: Urology  Consulting Attending: Dr.  Cam   Reason for Consult:  Gross Hematuria  HPI: Scott Vasquez is seen in consultation for reasons noted above at the request of Scott Leatrice FERNS, MD. Patient is a 83 y.o. male presenting to [facility] emergency department, brought in by son for AMS, hypoxia, and hypotension.  PMH significant for T2DM, HTN, diverticulosis, CVA, IDA, and BPH.  He was originally seen by Dr. Carolee for elevated PSA and BPH.  Monitoring discontinued based on age.  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 On my arrival patient was smiling and tracking but on able to answer any questions.  Dementia is quite advanced.  PureWick in place with scant tea colored urine.  No clot material appreciated  ------------------  Assessment:   83 y.o. male with severe dementia and incidental finding of gross hematuria in diaper in the context of Plavix    Recommendations: # Gross Hematuria Continue PureWick.  No sign of obstruction.  Bladder scan around 20 mL per nursing. Trend labs. Hemodynamically stable and afebrile. WBC 18.1-->16.7, 2/2 CAP. No bacteria on UA CT hematuria displays marked prostatomegaly.  Otherwise there are no acute findings in the abdomen or pelvis.  13 mm adrenal nodule will require follow-up in 3 months.  Plavix  on hold.  Continue to hold if medically reasonable and challenge after bleeding has been resolved for 48 hours. PSA pending. Prosthesis in place based on imaging.  Did not remove patient's pure wick to further investigate. Unclear etiology, but favoring bleed from enlarged prostate. If bleeding ongoing, start daily finasteride after PSA collected.  Urology will follow peripherally  Case and plan discussed with Dr. Cam  Past Medical History: Past Medical History:  Diagnosis Date   Anemia    iron deficiency   Diabetes mellitus    type II    Diverticulosis of colon    Hypertension    Stroke (HCC)    numbness in left hand   Thyroid  nodule     Past Surgical History:  Past Surgical History:  Procedure Laterality Date   BACK SURGERY     CATARACT EXTRACTION, BILATERAL     SHOULDER SURGERY  08/2007   Supple   THYROID  LOBECTOMY Left 04/29/2015   THYROID  LOBECTOMY Left 04/29/2015   Procedure: THYROID  LOBECTOMY;  Surgeon: Scott Spinner, MD;  Location: [facility];  Service: General;  Laterality: Left;    Medication: Current Facility-Administered Medications  Medication Dose Route Frequency Provider Last Rate Last Admin   0.9 %  sodium chloride  infusion   Intravenous Continuous Sim Emery CROME, MD 100 mL/hr at 05/12/24 0334 New Bag at 05/12/24 0334   amLODipine  (NORVASC ) tablet 2.5 mg  2.5 mg Oral Daily Sim Emery L, MD   2.5 mg at 05/12/24 0844   atorvastatin  (LIPITOR ) tablet 80 mg  80 mg Oral Daily Garba, Mohammad L, MD   80 mg at 05/12/24 0845   azithromycin (ZITHROMAX) tablet 500 mg  500 mg Oral QHS Ogbata, Sylvester I, MD       cefTRIAXone (ROCEPHIN) 2 g in sodium chloride  0.9 % 100 mL IVPB  2 g Intravenous Q24H Garba, Mohammad L, MD       citalopram  (CELEXA ) tablet 20 mg  20 mg Oral Daily Garba, Mohammad L, MD   20 mg at 05/12/24 0844   donepezil  (ARICEPT ) tablet 10 mg  10 mg Oral QHS Sim Emery CROME, MD   10  mg at 05/11/24 2316   insulin  aspart (novoLOG ) injection 0-5 Units  0-5 Units Subcutaneous QHS Sim Emery CROME, MD   2 Units at 05/11/24 07-Jun-2315   insulin  aspart (novoLOG ) injection 0-9 Units  0-9 Units Subcutaneous TID WC Garba, Mohammad L, MD       levothyroxine  (SYNTHROID ) tablet 50 mcg  50 mcg Oral Daily Sim Emery L, MD   50 mcg at 05/12/24 0641   memantine  (NAMENDA ) tablet 10 mg  10 mg Oral BID Garba, Mohammad L, MD   10 mg at 05/12/24 0844    Allergies: Allergies  Allergen Reactions   Actos  [Pioglitazone ] Other (See Comments)    Diarrhea    Metformin  And Related Other (See Comments)    Diarrhea       Social History: Social History   Tobacco Use   Smoking status: Former    Current packs/day: 0.00    Average packs/day: 0.5 packs/day for 20.0 years (10.0 ttl pk-yrs)    Types: Cigarettes    Start date: 58    Quit date: 08/10/1958    Years since quitting: 65.8   Smokeless tobacco: Never  Vaping Use   Vaping status: Never Used  Substance Use Topics   Alcohol  use: No   Drug use: No    Family History Family History  Problem Relation Age of Onset   Diabetes Other    Diabetes Father        Died from diabetic coma in 06-07-1971   Hypertension Father    Stroke Mother        Died in 15   Hypertension Mother     Review of Systems  Unable to perform ROS: Dementia     Objective   Vital signs in last 24 hours: BP 124/62 (BP Location: Left Arm)   Pulse (!) 55   Temp 98.1 F (36.7 C)   Resp 19   SpO2 92%   Physical Exam General: demented HEENT: /AT Pulmonary: Normal work of breathing Cardiovascular: no cyanosis Abdomen: Soft, NTTP, nondistended GU: purewick with scant tea colored outpt   Most Recent Labs: Lab Results  Component Value Date   WBC 16.7 (H) 05/12/2024   HGB 10.6 (L) 05/12/2024   HCT 33.4 (L) 05/12/2024   PLT 245 05/12/2024    Lab Results  Component Value Date   NA 137 05/12/2024   K 3.7 05/12/2024   CL 103 05/12/2024   CO2 26 05/12/2024   Vasquez 14 05/12/2024   CREATININE 1.22 05/12/2024   CALCIUM  8.7 (L) 05/12/2024   MG 1.5 (L) 11/06/2023   PHOS 3.7 11/07/2023    Lab Results  Component Value Date   INR 1.0 11/05/2023   APTT 28 11/05/2023     Urine Culture: @[id number] (laburin,org,[id number],[id number])@   IMAGING: DG Chest Portable 1 View Result Date: 05/11/2024 CLINICAL DATA:  Hypotension fatigue lethargy EXAM: PORTABLE CHEST 1 VIEW COMPARISON:  01/01/2022 FINDINGS: Hypoventilatory change. Patchy atelectasis or mild infiltrate left lung base. Stable cardiomediastinal silhouette. No pneumothorax. Probable scarring at the right base.  IMPRESSION: Hypoventilatory change with patchy atelectasis or mild infiltrate at the left base. Electronically Signed   By: Scott Vasquez M.D.   On: 05/11/2024 20:11    ------  Scott Bourdon, NP Pager: [phone]   Please contact the urology consult pager with any further questions/concerns.

## 2024-05-12 NOTE — Significant Event (Signed)
 Patient was noticed to have gross hematuria.  On exam at bedside not in distress denies any pain.  Will check UA.  Discussed with on-call urologist team Ole Bourdon.  They will be seeing patient in consult.  Urologist recommended getting CT hematuria studies.  Will hold Lovenox  and Plavix  for now due to gross hematuria.  Further recommendations per urology.  Redia Cleaver

## 2024-05-12 NOTE — Progress Notes (Signed)
 PROGRESS NOTE    Scott Vasquez  FMW:[id number] DOB: 1941/05/22 DOA: 05/11/2024 PCP: [place], Betty G, MD  Outpatient Specialists:     Brief Narrative:  Patient is an 83 year old male with past medical history significant for type 2 diabetes mellitus, hypertension, CVA and iron deficiency.  Patient was admitted with altered mental status and hypotension.  On arrival to the emergency room, patient was hypoxic, with O2 sat of 79% on room air.  Leukocytosis was noted on presentation.  Urinalysis done on presentation revealed many bacteria, moderate leukocytes and greater than 50 WBC.  Chest x-ray revealed hypoventilatory changes with patchy atelectasis or mild infiltrate at the left base.  Patient is on IV ceftriaxone and azithromycin.  05/12/2024: Patient seen.  No significant history from patient.  Will repeat chest x-ray in the morning.  Follow urine, sputum and blood culture.  Consult PT/OT.  Assessment & Plan:   Principal Problem:   Acute hypoxemic respiratory failure (HCC) Active Problems:   Hyperlipidemia   Essential hypertension   CEREBROVASCULAR ACCIDENT, HX OF   DM2 (diabetes mellitus, type 2) (HCC)   Moderate dementia with behavioral disturbance (HCC)   CKD (chronic kidney disease), stage III (HCC)   Hypothyroidism   Urinary incontinence   UTI (urinary tract infection)   CAP (community acquired pneumonia)   Acute metabolic encephalopathy and hypotension:  - Etiology unclear. - Follow cultures.    -Urine specific gravity on presentation was 1.026, likely indicative of volume depletion. - Continue Rocephin and azithromycin. - Keep patient hydrated. - Supportive care.   Acute hypoxic respiratory failure due to community-acquired pneumonia:  -Started on Rocephin and Zithromax.   - Follow cultures. - Follow repeat chest x-ray.   - Cannot rule out atelectasis.   Possible UTI:  - Follow urine culture. - Continue antibiotics.     History of urinary incontinence:   Continue chronic home regimen.   Hypothyroidism:  -Continue levothyroxine .   Hypertension/Hypotension:  -BP is controlled.    Type 2 diabetes:  -Continue to hold glipizide .   -Sliding scale insulin    Moderate dementia:  -No behavioral problem   Hyperlipidemia:  -Continue statin     DVT prophylaxis:  Code Status: Full code Family Communication:  Disposition Plan:    Consultants:  None.  Procedures:  None.  Antimicrobials:  IV Rocephin. IV azithromycin   Subjective: Significant history from patient  Objective: Vitals:   05/12/24 0210 05/12/24 0415 05/12/24 0420 05/12/24 0830  BP: 126/69 135/66  124/62  Pulse: 60 62  (!) 55  Resp:  17  19  Temp: 97.9 F (36.6 C) 98.2 F (36.8 C)  98.1 F (36.7 C)  TempSrc: Oral Axillary    SpO2: 100% (!) 85% 98% 92%    Intake/Output Summary (Last 24 hours) at 05/12/2024 1346 Last data filed at 05/12/2024 1318 Gross per 24 hour  Intake 1600.16 ml  Output 680 ml  Net 920.16 ml   There were no vitals filed for this visit.  Examination:  General exam: Appears calm and comfortable  Respiratory system: Clear to auscultation.  Cardiovascular system: S1 & S2, soft systolic murmur.  Gastrointestinal system: Abdomen is soft and nontender.  Central nervous system: Awake and alert.  Very left-sided weakness.   Data Reviewed: I have personally reviewed following labs and imaging studies  CBC: Recent Labs  Lab 05/11/24 1823 05/12/24 0326  WBC 18.1* 16.7*  HGB 11.2* 10.6*  HCT 35.1* 33.4*  MCV 90.9 90.0  PLT 229 245   Basic Metabolic  Panel: Recent Labs  Lab 05/11/24 1823 05/12/24 0326  NA 135 137  K 4.0 3.7  CL 97* 103  CO2 25 26  GLUCOSE 152* 129*  BUN 17 14  CREATININE 1.49* 1.22  CALCIUM  9.1 8.7*   GFR: CrCl cannot be calculated (Unknown ideal weight.). Liver Function Tests: Recent Labs  Lab 05/11/24 1823 05/12/24 0326  AST 19 22  ALT 16 16  ALKPHOS 121 107  BILITOT 1.0 0.6  PROT 6.7 6.7   ALBUMIN 3.6 3.4*   No results for input(s): LIPASE, AMYLASE in the last 168 hours. No results for input(s): AMMONIA in the last 168 hours. Coagulation Profile: No results for input(s): INR, PROTIME in the last 168 hours. Cardiac Enzymes: No results for input(s): CKTOTAL, CKMB, CKMBINDEX, TROPONINI in the last 168 hours. BNP (last 3 results) No results for input(s): PROBNP in the last 8760 hours. HbA1C: Recent Labs    05/12/24 0326  HGBA1C 5.4   CBG: Recent Labs  Lab 05/11/24 1820 05/11/24 2308 05/11/24 2312 05/12/24 0822 05/12/24 1110  GLUCAP 144* 211* 216* 101* 92   Lipid Profile: No results for input(s): CHOL, HDL, LDLCALC, TRIG, CHOLHDL, LDLDIRECT in the last 72 hours. Thyroid  Function Tests: No results for input(s): TSH, T4TOTAL, FREET4, T3FREE, THYROIDAB in the last 72 hours. Anemia Panel: No results for input(s): VITAMINB12, FOLATE, FERRITIN, TIBC, IRON, RETICCTPCT in the last 72 hours. Urine analysis:    Component Value Date/Time   COLORURINE AMBER (A) 05/12/2024 0335   APPEARANCEUR CLOUDY (A) 05/12/2024 0335   LABSPEC 1.019 05/12/2024 0335   PHURINE 5.0 05/12/2024 0335   GLUCOSEU 50 (A) 05/12/2024 0335   HGBUR LARGE (A) 05/12/2024 0335   BILIRUBINUR NEGATIVE 05/12/2024 0335   BILIRUBINUR Neg 07/24/2016 1038   KETONESUR NEGATIVE 05/12/2024 0335   PROTEINUR 100 (A) 05/12/2024 0335   UROBILINOGEN negative 07/24/2016 1038   UROBILINOGEN 0.2 10/20/2014 0220   NITRITE NEGATIVE 05/12/2024 0335   LEUKOCYTESUR NEGATIVE 05/12/2024 0335   Sepsis Labs: @LABRCNTIP (procalcitonin:4,lacticidven:4)  ) Recent Results (from the past 240 hours)  Resp panel by RT-PCR (RSV, Flu A&B, Covid) Urine, In & Out Cath     Status: None   Collection Time: 05/11/24  7:00 PM   Specimen: Urine, In & Out Cath; Nasal Swab  Result Value Ref Range Status   SARS Coronavirus 2 by RT PCR NEGATIVE NEGATIVE Final   Influenza A by PCR  NEGATIVE NEGATIVE Final   Influenza B by PCR NEGATIVE NEGATIVE Final    Comment: (NOTE) The Xpert Xpress SARS-CoV-2/FLU/RSV plus assay is intended as an aid in the diagnosis of influenza from Nasopharyngeal swab specimens and should not be used as a sole basis for treatment. Nasal washings and aspirates are unacceptable for Xpert Xpress SARS-CoV-2/FLU/RSV testing.  Fact Sheet for Patients: BloggerCourse.com  Fact Sheet for Healthcare Providers: SeriousBroker.it  This test is not yet approved or cleared by the [place]  FDA and has been authorized for detection and/or diagnosis of SARS-CoV-2 by FDA under an Emergency Use Authorization (EUA). This EUA will remain in effect (meaning this test can be used) for the duration of the COVID-19 declaration under Section 564(b)(1) of the Act, 21 U.S.C. section 360bbb-3(b)(1), unless the authorization is terminated or revoked.     Resp Syncytial Virus by PCR NEGATIVE NEGATIVE Final    Comment: (NOTE) Fact Sheet for Patients: BloggerCourse.com  Fact Sheet for Healthcare Providers: SeriousBroker.it  This test is not yet approved or cleared by the [place]  FDA and has been authorized  for detection and/or diagnosis of SARS-CoV-2 by FDA under an Emergency Use Authorization (EUA). This EUA will remain in effect (meaning this test can be used) for the duration of the COVID-19 declaration under Section 564(b)(1) of the Act, 21 U.S.C. section 360bbb-3(b)(1), unless the authorization is terminated or revoked.  Performed at [facility], [address], [place], [place] [address]          Radiology Studies: CT HEMATURIA WORKUP Result Date: 05/12/2024 CLINICAL DATA:  Hematuria. EXAM: CT ABDOMEN AND PELVIS WITHOUT AND WITH CONTRAST TECHNIQUE: Multidetector CT imaging of the abdomen and pelvis was performed following the standard  protocol before and following the bolus administration of intravenous contrast. RADIATION DOSE REDUCTION: This exam was performed according to the departmental dose-optimization program which includes automated exposure control, adjustment of the mA and/or kV according to patient size and/or use of iterative reconstruction technique. CONTRAST:  OMNIPAQUE  IOHEXOL  350 MG/ML SOLN COMPARISON:  None Available. FINDINGS: Lower chest: Dependent collapse/consolidation is seen in both lung bases. Hepatobiliary: No suspicious focal abnormality within the liver parenchyma. There is no evidence for gallstones, gallbladder wall thickening, or pericholecystic fluid. No intrahepatic or extrahepatic biliary dilation. Pancreas: No focal mass lesion. No dilatation of the main duct. No intraparenchymal cyst. No peripancreatic edema. Spleen: No splenomegaly. No suspicious focal mass lesion. Trace perisplenic edema noted in the posterior left subdiaphragmatic space. Adrenals/Urinary Tract: Bilateral adrenal gland thickening evident. 13 mm left adrenal nodule has attenuation too high to allow classification as a benign adrenal adenoma Pre contrast imaging shows vascular calcification in the renal hila but no mineralized renal stones. No ureteral or bladder stones. Imaging after IV contrast administration shows no suspicious enhancing lesion in either kidney. 8 mm subcapsular low-density lesion interpolar right kidney is too small to characterize but is most likely benign. 3.6 cm simple cyst identified interpolar left kidney. Delayed post-contrast imaging shows no wall thickening or soft tissue filling defect in either intrarenal collecting system or renal pelvis. Both ureters are well opacified without evidence for wall thickening, soft tissue lesion or focal dilatation. Delayed imaging of the bladder shows no focal wall thickening or discrete mucosal mass lesion. Stomach/Bowel: Stomach is unremarkable. No gastric wall thickening.  No evidence of outlet obstruction. Duodenum is normally positioned as is the ligament of Treitz. No small bowel wall thickening. No small bowel dilatation. The terminal ileum is normal. The appendix is normal. No gross colonic mass. No colonic wall thickening. Vascular/Lymphatic: There is mild atherosclerotic calcification of the abdominal aorta without aneurysm. There is no gastrohepatic or hepatoduodenal ligament lymphadenopathy. No retroperitoneal or mesenteric lymphadenopathy. No pelvic sidewall lymphadenopathy. Reproductive: Prostate gland is enlarged with heterogeneous enhancement. Penile prosthetic device evident. Other: No intraperitoneal free fluid. Musculoskeletal: Degenerative changes are noted in the left hip. No worrisome lytic or sclerotic osseous abnormality. IMPRESSION: 1. No acute findings in the abdomen or pelvis. 2. Marked prostatomegaly. Otherwise, no findings to explain the patient's history of hematuria. Given heterogeneous enhancement of the prostatic parenchyma, correlation with serum PSA levels recommended. 3. 13 mm left adrenal nodule has attenuation too high to allow classification as a benign adrenal adenoma. 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 4. Dependent collapse/consolidation in both lung bases. 5.  Aortic Atherosclerosis (ICD10-I70.0). Electronically Signed   By: Camellia Candle M.D.   On: 05/12/2024 10:31   DG Chest Portable 1 View  Result Date: 05/11/2024 CLINICAL DATA:  Hypotension fatigue lethargy EXAM: PORTABLE CHEST 1 VIEW COMPARISON:  01/01/2022 FINDINGS: Hypoventilatory change. Patchy atelectasis or mild infiltrate left lung base. Stable cardiomediastinal silhouette. No pneumothorax. Probable scarring at the right base. IMPRESSION: Hypoventilatory change with patchy atelectasis or mild infiltrate at the left base.  Electronically Signed   By: Luke Bun M.D.   On: 05/11/2024 20:11        Scheduled Meds:  amLODipine   2.5 mg Oral Daily   atorvastatin   80 mg Oral Daily   azithromycin  500 mg Oral QHS   citalopram   20 mg Oral Daily   donepezil   10 mg Oral QHS   insulin  aspart  0-5 Units Subcutaneous QHS   insulin  aspart  0-9 Units Subcutaneous TID WC   levothyroxine   50 mcg Oral Daily   memantine   10 mg Oral BID   Continuous Infusions:  sodium chloride  100 mL/hr at 05/12/24 0334   cefTRIAXone (ROCEPHIN)  IV       LOS: 1 day    Time spent: 55 minutes.    Leatrice Chapel, MD  [facility] Pager #: [phone] 7PM-7AM contact night coverage as above

## 2024-05-13 ENCOUNTER — Inpatient Hospital Stay (HOSPITAL_COMMUNITY)

## 2024-05-13 DIAGNOSIS — J9601 Acute respiratory failure with hypoxia: Secondary | ICD-10-CM | POA: Diagnosis not present

## 2024-05-13 LAB — CULTURE, OB URINE: Culture: NO GROWTH

## 2024-05-13 LAB — GLUCOSE, CAPILLARY
Glucose-Capillary: 195 mg/dL — ABNORMAL HIGH (ref 70–99)
Glucose-Capillary: 157 mg/dL — ABNORMAL HIGH (ref 70–99)
Glucose-Capillary: 198 mg/dL — ABNORMAL HIGH (ref 70–99)
Glucose-Capillary: 167 mg/dL — ABNORMAL HIGH (ref 70–99)

## 2024-05-13 LAB — CBC WITH DIFFERENTIAL/PLATELET
Abs Immature Granulocytes: 0.09 K/uL — ABNORMAL HIGH (ref 0.00–0.07)
Basophils Absolute: 0 K/uL (ref 0.0–0.1)
Basophils Relative: 0 %
Eosinophils Absolute: 0.2 K/uL (ref 0.0–0.5)
Eosinophils Relative: 2 %
HCT: 29 % — ABNORMAL LOW (ref 39.0–52.0)
Hemoglobin: 9.2 g/dL — ABNORMAL LOW (ref 13.0–17.0)
Immature Granulocytes: 1 %
Lymphocytes Relative: 6 %
Lymphs Abs: 0.8 K/uL (ref 0.7–4.0)
MCH: 28.8 pg (ref 26.0–34.0)
MCHC: 31.7 g/dL (ref 30.0–36.0)
MCV: 90.6 fL (ref 80.0–100.0)
Monocytes Absolute: 1.2 K/uL — ABNORMAL HIGH (ref 0.1–1.0)
Monocytes Relative: 9 %
Neutro Abs: 11.9 K/uL — ABNORMAL HIGH (ref 1.7–7.7)
Neutrophils Relative %: 82 %
Platelets: 216 K/uL (ref 150–400)
RBC: 3.2 MIL/uL — ABNORMAL LOW (ref 4.22–5.81)
RDW: 14.4 % (ref 11.5–15.5)
WBC: 14.3 K/uL — ABNORMAL HIGH (ref 4.0–10.5)
nRBC: 0 % (ref 0.0–0.2)

## 2024-05-13 LAB — RENAL FUNCTION PANEL
Albumin: 2.7 g/dL — ABNORMAL LOW (ref 3.5–5.0)
Anion gap: 6 (ref 5–15)
BUN: 18 mg/dL (ref 8–23)
CO2: 26 mmol/L (ref 22–32)
Calcium: 8.3 mg/dL — ABNORMAL LOW (ref 8.9–10.3)
Chloride: 105 mmol/L (ref 98–111)
Creatinine, Ser: 1.18 mg/dL (ref 0.61–1.24)
GFR, Estimated: >60 mL/min (ref >60)
Glucose, Bld: 198 mg/dL — ABNORMAL HIGH (ref 70–99)
Phosphorus: 2.3 mg/dL — ABNORMAL LOW (ref 2.5–4.6)
Potassium: 3.9 mmol/L (ref 3.5–5.1)
Sodium: 137 mmol/L (ref 135–145)

## 2024-05-13 LAB — MAGNESIUM: Magnesium: 1.6 mg/dL — ABNORMAL LOW (ref 1.7–2.4)

## 2024-05-13 MED ORDER — MAGNESIUM SULFATE 2 GM/50ML IV SOLN
2.0000 g | Freq: Once | INTRAVENOUS | Status: AC
  Administered 2024-05-13: 2 g via INTRAVENOUS
  Filled 2024-05-13: qty 50

## 2024-05-13 NOTE — Evaluation (Signed)
 Occupational Therapy Evaluation Patient Details Name: Scott Vasquez MRN: [id number] DOB: 1941-07-10 Today's Date: 05/13/2024   History of Present Illness   83 year old male  admitted 10/2 with altered mental status and hypotension. On arrival to the emergency room, patient was hypoxic, with O2 sat of 79% on room air. Dx Acute hypoxic respiratory failure due to community-acquired pneumonia and possible UTI. PMH: type 2 diabetes mellitus, hypertension, CVA and iron deficiency.     Clinical Impressions At baseline, pt completes ADLs with Set up to Max assist, performs functional mobility household distances with a Rollator with Mod I, and receives assistance from family for all IADLs. Pt with cognitive deficits at baseline with son reporting pt's current cognition is near baseline. Pt now presents with decreased activity tolerance, decreased B shoulder strength, decreased B UE coordination, decreased balance, and decreased safety and independence with functional tasks. Pt currently demonstrates ability to complete ADLs largely with Set up to Total assist, bed mobility with Min assist, and functional mobility/transfers with a RW with Min assist. Pt requires cues for safety and sequencing during functional tasks. Pt VSS on 2L continuous O2 through nasal cannula throughout session. Pt participated well and has good family support. Pt will benefit from acute skilled OT services to address deficits and increase safety and independence with functional tasks. Post acute discharge, pt will benefit from [facility] OT paired with 24/7 supervision/assistance of family.      If plan is discharge home, recommend the following:   A little help with walking and/or transfers;A lot of help with bathing/dressing/bathroom;Assistance with cooking/housework;Direct supervision/assist for medications management;Direct supervision/assist for financial management;Assist for transportation;Help with stairs or ramp for  entrance;Supervision due to cognitive status     Functional Status Assessment   Patient has had a recent decline in their functional status and demonstrates the ability to make significant improvements in function in a reasonable and predictable amount of time.     Equipment Recommendations   None recommended by OT (Pt already has needed equipment)     Recommendations for Other Services         Precautions/Restrictions   Precautions Precautions: Fall Recall of Precautions/Restrictions: Impaired Precaution/Restrictions Comments: monitor O2 Restrictions Weight Bearing Restrictions Per Provider Order: No     Mobility Bed Mobility Overal bed mobility: Needs Assistance Bed Mobility: Sit to supine     Sit to supine: Min assist     General bed mobility comments: Min assist to elevate B LE an manage trunk. Extra time required.    Transfers Overall transfer level: Needs assistance Equipment used: Rolling walker (2 wheels) Transfers: Sit to/from Stand Sit to Stand: Min assist           General transfer comment: Min assist for boost to stand. Cues for hand placement.      Balance Overall balance assessment: Needs assistance Sitting-balance support: No upper extremity supported, Feet supported Sitting balance-Leahy Scale: Good     Standing balance support: Bilateral upper extremity supported, Reliant on assistive device for balance Standing balance-Leahy Scale: Poor                             ADL either performed or assessed with clinical judgement   ADL Overall ADL's : Needs assistance/impaired Eating/Feeding: Set up;Sitting   Grooming: Set up;Contact guard assist;Sitting   Upper Body Bathing: Minimal assistance;Sitting;Cueing for sequencing   Lower Body Bathing: Maximal assistance;Sit to/from stand;Sitting/lateral leans;Cueing for compensatory techniques;Cueing for sequencing;Cueing for safety  Upper Body Dressing : Contact guard  assist;Minimal assistance;Sitting;Cueing for sequencing;Cueing for compensatory techniques   Lower Body Dressing: Maximal assistance;Sitting/lateral leans;Sit to/from stand;Cueing for sequencing;Cueing for compensatory techniques   Toilet Transfer: Minimal assistance;Rolling walker (2 wheels);BSC/3in1;Ambulation;Cueing for safety;Cueing for sequencing Toilet Transfer Details (indicate cue type and reason): simulated recliner to bed Toileting- Clothing Manipulation and Hygiene: Maximal assistance;Total assistance;Sit to/from stand Toileting - Clothing Manipulation Details (indicate cue type and reason): for peri care in standing following episode of incontinence     Functional mobility during ADLs: Minimal assistance;Rolling walker (2 wheels) General ADL Comments: Pt requiries increased time for tasks. Pt with decreased activity tolerance.     Vision Patient Visual Report: No change from baseline Vision Assessment?: No apparent visual deficits Additional Comments: Vision [facility] for tasks assessed; not formally screened or evaluated     Perception         Praxis         Pertinent Vitals/Pain Pain Assessment Pain Assessment: No/denies pain     Extremity/Trunk Assessment Upper Extremity Assessment Upper Extremity Assessment: Right hand dominant;RUE deficits/detail;LUE deficits/detail RUE Deficits / Details: gross shoulder strength 3+/5, UE strength otherwise WFL; AROM WFL; decreased proprioception; decreased coordination RUE Sensation: decreased proprioception RUE Coordination: decreased gross motor;decreased fine motor LUE Deficits / Details: gross shoulder strength 3+/5, UE strength otherwise WFL; AROM WFL; decreased proprioception; decreased coordination LUE Sensation: decreased proprioception LUE Coordination: decreased fine motor;decreased gross motor   Lower Extremity Assessment Lower Extremity Assessment: Defer to PT evaluation       Communication  Communication Communication: Impaired Factors Affecting Communication: Reduced clarity of speech   Cognition Arousal: Alert Behavior During Therapy: WFL for tasks assessed/performed, Flat affect Cognition: Cognition impaired, History of cognitive impairments   Orientation impairments: Time, Situation, Place (Pt able to state he is in hospital, but uncertain which one.) Awareness: Intellectual awareness intact, Online awareness impaired Memory impairment (select all impairments): Short-term memory, Working Civil Service fast streamer, Conservation officer, historic buildings, Non-declarative long-term memory Attention impairment (select first level of impairment): Selective attention (Pt easily distracted by lines and cords.) Executive functioning impairment (select all impairments): Initiation, Organization, Reasoning, Problem solving, Sequencing OT - Cognition Comments: Pt oriented to self, son, and being in the hospital. Pt pleasant throughout session and participated well.Pt with diagnosis of dementia at baseline. Per son's report, pt cognition is near baseline.                 Following commands: Impaired Following commands impaired: Follows one step commands inconsistently, Follows one step commands with increased time     Cueing  General Comments   Cueing Techniques: Gestural cues;Verbal cues;Tactile cues  VSS on 2L continuous O2 through nasal cannula throughout session. Pt's son present and supportive throughout session.   Exercises     Shoulder Instructions      Home Living Family/patient expects to be discharged to:: Private residence Living Arrangements: Spouse/significant other Available Help at Discharge: Family;Available 24 hours/day (wife and daughter; son lives about 10 miles away and can also provide assistance; son works day shift and daughter works night shift) Type of Home: House Home Access: Stairs to enter Secretary/administrator of Steps: 2 Entrance Stairs-Rails: Right;Left Home  Layout: Two level;Able to live on main level with bedroom/bathroom     Bathroom Shower/Tub: Tub/shower unit   Bathroom Toilet: Standard Bathroom Accessibility: Yes   Home Equipment: Cane - single point;BSC/3in1;Rollator (4 wheels);Rolling Walker (2 wheels);Wheelchair - manual;Grab bars - tub/shower;Shower seat   Additional Comments: Home set-up per son's report.  Prior Functioning/Environment Prior Level of Function : Independent/Modified Independent             Mobility Comments: Per son, pt typically uses a Rollator in the home and has a w/c for community mobility. No reported falls. ADLs Comments: Son reports pt's daughter has been staying with pt and his wife and assisting with pt with ADLs and IADLs at baseline. At baseline, pt able to self feed and complete grooming tasks with Set up, UB dressing/bathing with Set up to CGA, LB bathing/dressing with Mod assist; and toileting with Max assist due to incontinence at baseline. Per son, pt's wife is also able to assist 24/7 with medication management, meal prep, and other light duty tasks, but is not able to provide physical assist to pt. Pt enjoys watching football and NASCAR. 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    OT Problem List: Decreased strength;Decreased activity tolerance;Impaired balance (sitting and/or standing);Decreased coordination;Cardiopulmonary status limiting activity   OT Treatment/Interventions: Self-care/ADL training;Therapeutic exercise;DME and/or AE instruction;Energy conservation;Therapeutic activities;Cognitive remediation/compensation;Patient/family education;Balance training      OT Goals(Current goals can be found in the care plan section)   Acute Rehab OT Goals Patient Stated Goal: to go home OT Goal Formulation: With patient/family Time For Goal Achievement: 05/27/24 Potential to Achieve Goals: Good ADL Goals Pt Will Perform Grooming: with set-up;with supervision;standing Pt Will  Perform Upper Body Bathing: with contact guard assist;sitting Pt Will Perform Upper Body Dressing: with supervision;with set-up;sitting Pt Will Perform Lower Body Dressing: with min assist;with mod assist;sitting/lateral leans;sit to/from stand Pt Will Transfer to Toilet: with contact guard assist;ambulating;regular height toilet (with least restrictive AD)   OT Frequency:  Min 2X/week    Co-evaluation              AM-PAC OT 6 Clicks Daily Activity     Outcome Measure Help from another person eating meals?: A Little Help from another person taking care of personal grooming?: A Little Help from another person toileting, which includes using toliet, bedpan, or urinal?: Total Help from another person bathing (including washing, rinsing, drying)?: A Lot Help from another person to put on and taking off regular upper body clothing?: A Little Help from another person to put on and taking off regular lower body clothing?: A Lot 6 Click Score: 14   End of Session Equipment Utilized During Treatment: Gait belt;Rolling walker (2 wheels);Oxygen Nurse Communication: Mobility status;Other (comment) (During peri care, OT noticed a small area of skin in the cleft of pt's buttocks near the perineal area that looks a little raw; RN reports he will assess area.)  Activity Tolerance: Patient tolerated treatment well Patient left: in bed;with call bell/phone within reach;with bed alarm set;with family/visitor present  OT Visit Diagnosis: Unsteadiness on feet (R26.81);Muscle weakness (generalized) (M62.81)                Time: 8380-8341 OT Time Calculation (min): 39 min Charges:  OT General Charges $OT Visit: 1 Visit OT Evaluation $OT Eval Low Complexity: 1 Low OT Treatments $Self Care/Home Management : 23-37 mins  Margarie Rockey HERO., OTR/L, MA Acute Rehab [phone]   Margarie FORBES Horns 05/13/2024, 5:28 PM

## 2024-05-13 NOTE — Progress Notes (Addendum)
 PROGRESS NOTE    Scott Vasquez  FMW:[id number] DOB: 1940/08/13 DOA: 05/11/2024 PCP: [place], Betty G, MD  Outpatient Specialists:     Brief Narrative:  Patient is an 83 year old male with past medical history significant for type 2 diabetes mellitus, hypertension, CVA and iron deficiency.  Patient was admitted with altered mental status and hypotension.  On arrival to the emergency room, patient was hypoxic, with O2 sat of 79% on room air.  Leukocytosis was noted on presentation.  Urinalysis done on presentation revealed many bacteria, moderate leukocytes and greater than 50 WBC.  Chest x-ray revealed hypoventilatory changes with patchy atelectasis or mild infiltrate at the left base.  Patient is on IV ceftriaxone and azithromycin.  05/12/2024: Patient seen.  No significant history from patient.  Will repeat chest x-ray in the morning.  Follow urine, sputum and blood culture.  Consult PT/OT.  05/13/2024: Patient seen.  Patient looks better today.  No significant history from patient.  Mild improvement in [place] ptosis.  WBC is down to 14.3.  Will repeat chest x-ray today.  Magnesium of 1.6 noted, will replete.  Albumin of 2.7.  Assessment & Plan:   Principal Problem:   Acute hypoxemic respiratory failure (HCC) Active Problems:   Hyperlipidemia   Essential hypertension   CEREBROVASCULAR ACCIDENT, HX OF   DM2 (diabetes mellitus, type 2) (HCC)   Moderate dementia with behavioral disturbance (HCC)   CKD (chronic kidney disease), stage III (HCC)   Hypothyroidism   Urinary incontinence   UTI (urinary tract infection)   CAP (community acquired pneumonia)   Acute metabolic encephalopathy and hypotension:  - Etiology unclear.  Likely multifactorial.  Concern for possible UTI and/or pneumonia. - Follow cultures.    -Urine specific gravity on presentation was 1.026, likely indicative of volume depletion. - Continue Rocephin and azithromycin. - Keep patient hydrated. - Supportive  care. 05/13/2024: Repeat chest x-ray today.   Acute hypoxic respiratory failure due to community-acquired pneumonia:  -Started on Rocephin and Zithromax.   - Follow cultures. - Follow repeat chest x-ray.   - Cannot rule out atelectasis. 05/13/2024:  Incentive spirometry.   Possible UTI:  - Follow urine culture. - Continue antibiotics.   05/13/2024:  Negative cultures to date.   History of urinary incontinence:    Hypothyroidism:  -Continue levothyroxine .   Hypertension/Hypotension:  -BP is controlled. -Last BP of 120/64 mmHg.    Type 2 diabetes:  -Continue to hold glipizide .   -Sliding scale insulin  -Blood sugars reasonably controlled.   Moderate dementia:  -No behavioral problem   Hyperlipidemia:  -Continue statin     DVT prophylaxis:  Code Status: Full code Family Communication: Called wife and left a Engineer, technical sales. Disposition Plan:    Consultants:  None.  Procedures:  None.  Antimicrobials:  IV Rocephin. IV azithromycin   Subjective: Significant history from patient  Objective: Vitals:   05/12/24 1645 05/12/24 1953 05/13/24 0349 05/13/24 0810  BP: 104/61 127/69 125/65 120/64  Pulse: 69 80 73 62  Resp: 19 18 18 19   Temp: 98.2 F (36.8 C) 98.7 F (37.1 C) 99.3 F (37.4 C) 98 F (36.7 C)  TempSrc:      SpO2: 100% 97% 98% 98%    Intake/Output Summary (Last 24 hours) at 05/13/2024 1623 Last data filed at 05/13/2024 1600 Gross per 24 hour  Intake 2910.58 ml  Output 950 ml  Net 1960.58 ml   There were no vitals filed for this visit.  Examination:  General exam: Appears calm and comfortable  Respiratory system: Clear to auscultation.  Cardiovascular system: S1 & S2, soft systolic murmur.  Gastrointestinal system: Abdomen is soft and nontender.  Central nervous system: Awake and alert.  Very left-sided weakness.   Data Reviewed: I have personally reviewed following labs and imaging studies  CBC: Recent Labs  Lab 05/11/24 1823  05/12/24 0326 05/13/24 0751  WBC 18.1* 16.7* 14.3*  NEUTROABS  --   --  11.9*  HGB 11.2* 10.6* 9.2*  HCT 35.1* 33.4* 29.0*  MCV 90.9 90.0 90.6  PLT 229 245 216   Basic Metabolic Panel: Recent Labs  Lab 05/11/24 1823 05/12/24 0326 05/13/24 0751  NA 135 137 137  K 4.0 3.7 3.9  CL 97* 103 105  CO2 25 26 26   GLUCOSE 152* 129* 198*  BUN 17 14 18   CREATININE 1.49* 1.22 1.18  CALCIUM  9.1 8.7* 8.3*  MG  --   --  1.6*  PHOS  --   --  2.3*   GFR: CrCl cannot be calculated (Unknown ideal weight.). Liver Function Tests: Recent Labs  Lab 05/11/24 1823 05/12/24 0326 05/13/24 0751  AST 19 22  --   ALT 16 16  --   ALKPHOS 121 107  --   BILITOT 1.0 0.6  --   PROT 6.7 6.7  --   ALBUMIN 3.6 3.4* 2.7*   No results for input(s): LIPASE, AMYLASE in the last 168 hours. No results for input(s): AMMONIA in the last 168 hours. Coagulation Profile: No results for input(s): INR, PROTIME in the last 168 hours. Cardiac Enzymes: No results for input(s): CKTOTAL, CKMB, CKMBINDEX, TROPONINI in the last 168 hours. BNP (last 3 results) No results for input(s): PROBNP in the last 8760 hours. HbA1C: Recent Labs    05/12/24 0326  HGBA1C 5.4   CBG: Recent Labs  Lab 05/12/24 1110 05/12/24 1641 05/12/24 1955 05/13/24 0811 05/13/24 1102  GLUCAP 92 237* 285* 195* 157*   Lipid Profile: No results for input(s): CHOL, HDL, LDLCALC, TRIG, CHOLHDL, LDLDIRECT in the last 72 hours. Thyroid  Function Tests: No results for input(s): TSH, T4TOTAL, FREET4, T3FREE, THYROIDAB in the last 72 hours. Anemia Panel: No results for input(s): VITAMINB12, FOLATE, FERRITIN, TIBC, IRON, RETICCTPCT in the last 72 hours. Urine analysis:    Component Value Date/Time   COLORURINE AMBER (A) 05/12/2024 0335   APPEARANCEUR CLOUDY (A) 05/12/2024 0335   LABSPEC 1.019 05/12/2024 0335   PHURINE 5.0 05/12/2024 0335   GLUCOSEU 50 (A) 05/12/2024 0335   HGBUR LARGE  (A) 05/12/2024 0335   BILIRUBINUR NEGATIVE 05/12/2024 0335   BILIRUBINUR Neg 07/24/2016 1038   KETONESUR NEGATIVE 05/12/2024 0335   PROTEINUR 100 (A) 05/12/2024 0335   UROBILINOGEN negative 07/24/2016 1038   UROBILINOGEN 0.2 10/20/2014 0220   NITRITE NEGATIVE 05/12/2024 0335   LEUKOCYTESUR NEGATIVE 05/12/2024 0335   Sepsis Labs: @LABRCNTIP (procalcitonin:4,lacticidven:4)  ) Recent Results (from the past 240 hours)  Resp panel by RT-PCR (RSV, Flu A&B, Covid) Urine, In & Out Cath     Status: None   Collection Time: 05/11/24  7:00 PM   Specimen: Urine, In & Out Cath; Nasal Swab  Result Value Ref Range Status   SARS Coronavirus 2 by RT PCR NEGATIVE NEGATIVE Final   Influenza A by PCR NEGATIVE NEGATIVE Final   Influenza B by PCR NEGATIVE NEGATIVE Final    Comment: (NOTE) The Xpert Xpress SARS-CoV-2/FLU/RSV plus assay is intended as an aid in the diagnosis of influenza from Nasopharyngeal swab specimens and should not be used as a sole  basis for treatment. Nasal washings and aspirates are unacceptable for Xpert Xpress SARS-CoV-2/FLU/RSV testing.  Fact Sheet for Patients: BloggerCourse.com  Fact Sheet for Healthcare Providers: SeriousBroker.it  This test is not yet approved or cleared by the [place]  FDA and has been authorized for detection and/or diagnosis of SARS-CoV-2 by FDA under an Emergency Use Authorization (EUA). This EUA will remain in effect (meaning this test can be used) for the duration of the COVID-19 declaration under Section 564(b)(1) of the Act, 21 U.S.C. section 360bbb-3(b)(1), unless the authorization is terminated or revoked.     Resp Syncytial Virus by PCR NEGATIVE NEGATIVE Final    Comment: (NOTE) Fact Sheet for Patients: BloggerCourse.com  Fact Sheet for Healthcare Providers: SeriousBroker.it  This test is not yet approved or cleared by the [place] FDA and has been authorized for detection and/or diagnosis of SARS-CoV-2 by FDA under an Emergency Use Authorization (EUA). This EUA will remain in effect (meaning this test can be used) for the duration of the COVID-19 declaration under Section 564(b)(1) of the Act, 21 U.S.C. section 360bbb-3(b)(1), unless the authorization is terminated or revoked.  Performed at [facility], [address] [place], [place] [address]   Culture, MAINE Urine     Status: None   Collection Time: 05/12/24  8:38 PM   Specimen: Urine, Clean Catch  Result Value Ref Range Status   Specimen Description URINE, CLEAN CATCH  Final   Special Requests NONE  Final   Culture   Final    NO GROWTH Performed at [facility], [address], [place], [place] [address]    Report Status 05/13/2024 FINAL  Final  Culture, blood (Routine X 2) w Reflex to ID Panel     Status: None (Preliminary result)   Collection Time: 05/12/24 10:14 PM   Specimen: BLOOD LEFT HAND  Result Value Ref Range Status   Specimen Description BLOOD LEFT HAND  Final   Special Requests   Final    BOTTLES DRAWN AEROBIC AND ANAEROBIC Blood Culture results may not be optimal due to an inadequate volume of blood received in culture bottles   Culture   Final    NO GROWTH < 12 HOURS Performed at [facility], [address] [place], [place] [address]    Report Status PENDING  Incomplete  Culture, blood (Routine X 2) w Reflex to ID Panel     Status: None (Preliminary result)   Collection Time: 05/12/24 10:16 PM   Specimen: BLOOD LEFT ARM  Result Value Ref Range Status   Specimen Description BLOOD LEFT ARM  Final   Special Requests   Final    BOTTLES DRAWN AEROBIC AND ANAEROBIC Blood Culture adequate volume   Culture   Final    NO GROWTH < 12 HOURS Performed at [facility], [address], [place], [place] [address]    Report Status PENDING  Incomplete         Radiology Studies: DG CHEST PORT 1 VIEW Result  Date: 05/13/2024 CLINICAL DATA:  [id number] Pneumonia [id number] EXAM: PORTABLE CHEST - 1 VIEW COMPARISON:  May 11, 2024 FINDINGS: Markedly low lung volumes with bronchovascular crowding. Persistent streaky atelectasis in the left lung base with streaky atelectasis in the right lung base. No new airspace consolidation, pleural effusion, or pneumothorax. No cardiomegaly. Aortic atherosclerosis. Multilevel thoracic osteophytosis. IMPRESSION: Markedly lower lung volumes with streaky bibasilar atelectasis. Electronically Signed   By: Rogelia Myers M.D.   On: 05/13/2024  12:23   CT HEMATURIA WORKUP Result Date: 05/12/2024 CLINICAL DATA:  Hematuria. EXAM: CT ABDOMEN AND PELVIS WITHOUT AND WITH CONTRAST TECHNIQUE: Multidetector CT imaging of the abdomen and pelvis was performed following the standard protocol before and following the bolus administration of intravenous contrast. RADIATION DOSE REDUCTION: This exam was performed according to the departmental dose-optimization program which includes automated exposure control, adjustment of the mA and/or kV according to patient size and/or use of iterative reconstruction technique. CONTRAST:  OMNIPAQUE  IOHEXOL  350 MG/ML SOLN COMPARISON:  None Available. FINDINGS: Lower chest: Dependent collapse/consolidation is seen in both lung bases. Hepatobiliary: No suspicious focal abnormality within the liver parenchyma. There is no evidence for gallstones, gallbladder wall thickening, or pericholecystic fluid. No intrahepatic or extrahepatic biliary dilation. Pancreas: No focal mass lesion. No dilatation of the main duct. No intraparenchymal cyst. No peripancreatic edema. Spleen: No splenomegaly. No suspicious focal mass lesion. Trace perisplenic edema noted in the posterior left subdiaphragmatic space. Adrenals/Urinary Tract: Bilateral adrenal gland thickening evident. 13 mm left adrenal nodule has attenuation too high to allow classification as a benign adrenal adenoma Pre  contrast imaging shows vascular calcification in the renal hila but no mineralized renal stones. No ureteral or bladder stones. Imaging after IV contrast administration shows no suspicious enhancing lesion in either kidney. 8 mm subcapsular low-density lesion interpolar right kidney is too small to characterize but is most likely benign. 3.6 cm simple cyst identified interpolar left kidney. Delayed post-contrast imaging shows no wall thickening or soft tissue filling defect in either intrarenal collecting system or renal pelvis. Both ureters are well opacified without evidence for wall thickening, soft tissue lesion or focal dilatation. Delayed imaging of the bladder shows no focal wall thickening or discrete mucosal mass lesion. Stomach/Bowel: Stomach is unremarkable. No gastric wall thickening. No evidence of outlet obstruction. Duodenum is normally positioned as is the ligament of Treitz. No small bowel wall thickening. No small bowel dilatation. The terminal ileum is normal. The appendix is normal. No gross colonic mass. No colonic wall thickening. Vascular/Lymphatic: There is mild atherosclerotic calcification of the abdominal aorta without aneurysm. There is no gastrohepatic or hepatoduodenal ligament lymphadenopathy. No retroperitoneal or mesenteric lymphadenopathy. No pelvic sidewall lymphadenopathy. Reproductive: Prostate gland is enlarged with heterogeneous enhancement. Penile prosthetic device evident. Other: No intraperitoneal free fluid. Musculoskeletal: Degenerative changes are noted in the left hip. No worrisome lytic or sclerotic osseous abnormality. IMPRESSION: 1. No acute findings in the abdomen or pelvis. 2. Marked prostatomegaly. Otherwise, no findings to explain the patient's history of hematuria. Given heterogeneous enhancement of the prostatic parenchyma, correlation with serum PSA levels recommended. 3. 13 mm left adrenal nodule has attenuation too high to allow classification as a benign  adrenal adenoma. 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 4. Dependent collapse/consolidation in both lung bases. 5.  Aortic Atherosclerosis (ICD10-I70.0). Electronically Signed   By: Camellia Candle M.D.   On: 05/12/2024 10:31   DG Chest Portable 1 View Result Date: 05/11/2024 CLINICAL DATA:  Hypotension fatigue lethargy EXAM: PORTABLE CHEST 1 VIEW COMPARISON:  01/01/2022 FINDINGS: Hypoventilatory change. Patchy atelectasis or mild infiltrate left lung base. Stable cardiomediastinal silhouette. No pneumothorax. Probable scarring at the right base. IMPRESSION: Hypoventilatory change with patchy atelectasis or mild infiltrate at the left base. Electronically Signed   By: Luke Bun M.D.   On: 05/11/2024 20:11  Scheduled Meds:  amLODipine   2.5 mg Oral Daily   atorvastatin   80 mg Oral Daily   azithromycin  500 mg Oral QHS   citalopram   20 mg Oral Daily   donepezil   10 mg Oral QHS   insulin  aspart  0-5 Units Subcutaneous QHS   insulin  aspart  0-9 Units Subcutaneous TID WC   levothyroxine   50 mcg Oral Daily   memantine   10 mg Oral BID   Continuous Infusions:  sodium chloride  100 mL/hr at 05/13/24 1233   cefTRIAXone (ROCEPHIN)  IV Stopped (05/12/24 1752)     LOS: 2 days    Time spent: 35 minutes.    Leatrice Chapel, MD  [facility] Pager #: [phone] 7PM-7AM contact night coverage as above

## 2024-05-13 NOTE — Evaluation (Signed)
 Physical Therapy Evaluation Patient Details Name: Scott Vasquez MRN: [id number] DOB: January 17, 1941 Today's Date: 05/13/2024  History of Present Illness  83 year old male  admitted 10/2 with altered mental status and hypotension. On arrival to the emergency room, patient was hypoxic, with O2 sat of 79% on room air. Dx Acute hypoxic respiratory failure due to community-acquired pneumonia and possible UTI. PMH: type 2 diabetes mellitus, hypertension, CVA and iron deficiency.  Clinical Impression  Pt admitted with above diagnosis. Questionable historian with some confusion noted. States he is independent at home with use of 4WW indoors and W/c in community, denies falls, and performs ADLs at baseline. (Family not immediately available to confirm.) Required min assist for bed mobility, transfer, and short distance gait in room with RW. Distance limited by continuous bowel and urinary incontinence. Pt unaware. Requires assist for peri-care and hygiene. Able to stand EOB for approx 4 minutes while assisting. SpO2 97% on room air during session. 100% on 2L. Patient will benefit from continued inpatient follow up therapy, <3 hours/day. Pt currently with functional limitations due to the deficits listed below (see PT Problem List). Pt will benefit from acute skilled PT to increase their independence and safety with mobility to allow discharge.           If plan is discharge home, recommend the following: A little help with walking and/or transfers;A lot of help with bathing/dressing/bathroom;Assistance with cooking/housework;Assist for transportation;Help with stairs or ramp for entrance   Can travel by private vehicle   Yes    Equipment Recommendations None recommended by PT  Recommendations for Other Services       Functional Status Assessment Patient has had a recent decline in their functional status and demonstrates the ability to make significant improvements in function in a reasonable and  predictable amount of time.     Precautions / Restrictions Precautions Precautions: Fall Recall of Precautions/Restrictions: Impaired Precaution/Restrictions Comments: monitor O2 Restrictions Weight Bearing Restrictions Per Provider Order: No      Mobility  Bed Mobility Overal bed mobility: Needs Assistance Bed Mobility: Supine to Sit     Supine to sit: Min assist, HOB elevated     General bed mobility comments: Min assist with cues for technique and trunk support to rise and scoot to EOB. Extra time required.    Transfers Overall transfer level: Needs assistance Equipment used: Rolling walker (2 wheels) Transfers: Sit to/from Stand Sit to Stand: Min assist           General transfer comment: Min assist for boost to stand. Cues for hand placement.    Ambulation/Gait Ambulation/Gait assistance: Min assist Gait Distance (Feet): 5 Feet Assistive device: Rolling walker (2 wheels) Gait Pattern/deviations: Step-through pattern, Decreased stride length, Trunk flexed Gait velocity: dec     General Gait Details: Cues for technique to mobilize safely with RW. Able to clear feet well but needs cues for upright posture and closer proximity to RW for max safety/support. No overt buckling. Distance limited by significant amount of stool and urinary incontinence.  Stairs            Wheelchair Mobility     Tilt Bed    Modified Rankin (Stroke Patients Only)       Balance Overall balance assessment: Needs assistance Sitting-balance support: No upper extremity supported, Feet supported Sitting balance-Leahy Scale: Good     Standing balance support: Bilateral upper extremity supported, Reliant on assistive device for balance Standing balance-Leahy Scale: Poor  Pertinent Vitals/Pain Pain Assessment Pain Assessment: No/denies pain    Home Living Family/patient expects to be discharged to:: Private residence Living  Arrangements: Spouse/significant other Available Help at Discharge: Family;Available 24 hours/day Type of Home: House Home Access: Stairs to enter Entrance Stairs-Rails: Doctor, general practice of Steps: 2   Home Layout: Two level;Able to live on main level with bedroom/bathroom Home Equipment: Cane - single point;BSC/3in1;Rollator (4 wheels);Rolling Walker (2 wheels);Wheelchair - manual;Grab bars - tub/shower Additional Comments: Info obtained from prior admission: Some inaccuracies between report then and now.. spouse is 25 and not able to physically assist    Prior Function Prior Level of Function : Independent/Modified Independent             Mobility Comments: States he uses rollator indoors, W/c in community, denies falls (questionable historian) ADLs Comments: States ind with ADLs (questionable hx)     Extremity/Trunk Assessment   Upper Extremity Assessment Upper Extremity Assessment: Defer to OT evaluation    Lower Extremity Assessment Lower Extremity Assessment: Generalized weakness;Difficult to assess due to impaired cognition       Communication   Communication Communication: Impaired Factors Affecting Communication: Reduced clarity of speech    Cognition Arousal: Alert Behavior During Therapy: Flat affect   PT - Cognitive impairments: No family/caregiver present to determine baseline, Orientation, Awareness, Memory, Attention, Initiation, Sequencing, Safety/Judgement, Problem solving   Orientation impairments: Time, Situation                   PT - Cognition Comments: Oriented to self and location. Unaware of month or year, situation. Following commands: Impaired Following commands impaired: Follows one step commands inconsistently, Follows one step commands with increased time     Cueing Cueing Techniques: Gestural cues, Verbal cues, Tactile cues     General Comments      Exercises     Assessment/Plan    PT Assessment Patient  needs continued PT services  PT Problem List Decreased strength;Decreased activity tolerance;Decreased balance;Decreased mobility;Decreased coordination;Decreased cognition;Decreased knowledge of use of DME;Decreased safety awareness;Decreased knowledge of precautions       PT Treatment Interventions DME instruction;Gait training;Functional mobility training;Stair training;Therapeutic activities;Therapeutic exercise;Balance training;Neuromuscular re-education;Cognitive remediation;Patient/family education    PT Goals (Current goals can be found in the Care Plan section)  Acute Rehab PT Goals Patient Stated Goal: none stated PT Goal Formulation: Patient unable to participate in goal setting Time For Goal Achievement: 05/27/24 Potential to Achieve Goals: Good    Frequency Min 2X/week     Co-evaluation               AM-PAC PT 6 Clicks Mobility  Outcome Measure Help needed turning from your back to your side while in a flat bed without using bedrails?: A Little Help needed moving from lying on your back to sitting on the side of a flat bed without using bedrails?: A Little Help needed moving to and from a bed to a chair (including a wheelchair)?: A Little Help needed standing up from a chair using your arms (e.g., wheelchair or bedside chair)?: A Little Help needed to walk in hospital room?: A Lot Help needed climbing 3-5 steps with a railing? : A Lot 6 Click Score: 16    End of Session Equipment Utilized During Treatment: Gait belt;Oxygen Activity Tolerance: Other (comment) (frequent bowel and urinary incontinence) Patient left: in chair;with call bell/phone within reach;with chair alarm set Nurse Communication: Mobility status PT Visit Diagnosis: Unsteadiness on feet (R26.81);Other abnormalities of gait and mobility (R26.89);Muscle  weakness (generalized) (M62.81);Difficulty in walking, not elsewhere classified (R26.2);Other symptoms and signs involving the nervous system  (R29.898)    Time: 8875-8846 PT Time Calculation (min) (ACUTE ONLY): 29 min   Charges:   PT Evaluation $PT Eval Low Complexity: 1 Low PT Treatments $Therapeutic Activity: 8-22 mins PT General Charges $$ ACUTE PT VISIT: 1 Visit         Leontine Roads, PT, DPT [facility]  Rehabilitation Services Physical Therapist Office: [phone] Website: [email or web address]   Leontine GORMAN Roads 05/13/2024, 1:33 PM

## 2024-05-13 NOTE — Plan of Care (Signed)
  Problem: Fluid Volume: Goal: Ability to maintain a balanced intake and output will improve Outcome: Not Progressing   Problem: Metabolic: Goal: Ability to maintain appropriate glucose levels will improve Outcome: Not Progressing   Problem: Skin Integrity: Goal: Risk for impaired skin integrity will decrease Outcome: Not Progressing   Problem: Tissue Perfusion: Goal: Adequacy of tissue perfusion will improve Outcome: Not Progressing   Problem: Safety: Goal: Ability to remain free from injury will improve Outcome: Not Progressing

## 2024-05-13 NOTE — Telephone Encounter (Signed)
 He has been hospitalized, 05/11/24. BJ

## 2024-05-14 DIAGNOSIS — J9601 Acute respiratory failure with hypoxia: Secondary | ICD-10-CM | POA: Diagnosis not present

## 2024-05-14 LAB — CBC WITH DIFFERENTIAL/PLATELET
Abs Immature Granulocytes: 0.03 K/uL (ref 0.00–0.07)
Basophils Absolute: 0 K/uL (ref 0.0–0.1)
Basophils Relative: 0 %
Eosinophils Absolute: 0.4 K/uL (ref 0.0–0.5)
Eosinophils Relative: 4 %
HCT: 30.9 % — ABNORMAL LOW (ref 39.0–52.0)
Hemoglobin: 9.8 g/dL — ABNORMAL LOW (ref 13.0–17.0)
Immature Granulocytes: 0 %
Lymphocytes Relative: 10 %
Lymphs Abs: 1 K/uL (ref 0.7–4.0)
MCH: 28.6 pg (ref 26.0–34.0)
MCHC: 31.7 g/dL (ref 30.0–36.0)
MCV: 90.1 fL (ref 80.0–100.0)
Monocytes Absolute: 0.8 K/uL (ref 0.1–1.0)
Monocytes Relative: 8 %
Neutro Abs: 7.4 K/uL (ref 1.7–7.7)
Neutrophils Relative %: 78 %
Platelets: 247 K/uL (ref 150–400)
RBC: 3.43 MIL/uL — ABNORMAL LOW (ref 4.22–5.81)
RDW: 14.4 % (ref 11.5–15.5)
WBC: 9.5 K/uL (ref 4.0–10.5)
nRBC: 0 % (ref 0.0–0.2)

## 2024-05-14 LAB — GLUCOSE, CAPILLARY
Glucose-Capillary: 109 mg/dL — ABNORMAL HIGH (ref 70–99)
Glucose-Capillary: 179 mg/dL — ABNORMAL HIGH (ref 70–99)
Glucose-Capillary: 243 mg/dL — ABNORMAL HIGH (ref 70–99)
Glucose-Capillary: 198 mg/dL — ABNORMAL HIGH (ref 70–99)

## 2024-05-14 NOTE — Plan of Care (Signed)
  Problem: Coping: Goal: Ability to adjust to condition or change in health will improve Outcome: Progressing   Problem: Fluid Volume: Goal: Ability to maintain a balanced intake and output will improve Outcome: Progressing   Problem: Health Behavior/Discharge Planning: Goal: Ability to manage health-related needs will improve Outcome: Progressing

## 2024-05-14 NOTE — Plan of Care (Signed)
  Problem: Coping: Goal: Ability to adjust to condition or change in health will improve 05/14/2024 1915 by Casimir Keturah LABOR, RN Outcome: Progressing 05/14/2024 1915 by Casimir Keturah LABOR, RN Outcome: Progressing   Problem: Health Behavior/Discharge Planning: Goal: Ability to identify and utilize available resources and services will improve 05/14/2024 1915 by Casimir Keturah LABOR, RN Outcome: Progressing 05/14/2024 1915 by Casimir Keturah LABOR, RN Outcome: Progressing   Problem: Nutritional: Goal: Maintenance of adequate nutrition will improve 05/14/2024 1915 by Casimir Keturah LABOR, RN Outcome: Progressing 05/14/2024 1915 by Casimir Keturah LABOR, RN Outcome: Progressing

## 2024-05-14 NOTE — Plan of Care (Signed)
  Problem: Coping: Goal: Ability to adjust to condition or change in health will improve Outcome: Progressing   Problem: Activity: Goal: Ability to tolerate increased activity will improve Outcome: Progressing   Problem: Respiratory: Goal: Ability to maintain adequate ventilation will improve Outcome: Progressing Goal: Ability to maintain a clear airway will improve Outcome: Progressing   Problem: Education: Goal: Knowledge of General Education information will improve Description: Including pain rating scale, medication(s)/side effects and non-pharmacologic comfort measures Outcome: Progressing   Problem: Activity: Goal: Risk for activity intolerance will decrease Outcome: Progressing   Problem: Nutrition: Goal: Adequate nutrition will be maintained Outcome: Progressing   Problem: Clinical Measurements: Goal: Respiratory complications will improve Outcome: Progressing   Problem: Pain Managment: Goal: General experience of comfort will improve and/or be controlled Outcome: Progressing   Problem: Elimination: Goal: Will not experience complications related to bowel motility Outcome: Progressing Goal: Will not experience complications related to urinary retention Outcome: Progressing   Problem: Safety: Goal: Ability to remain free from injury will improve Outcome: Progressing

## 2024-05-14 NOTE — Progress Notes (Signed)
 Mobility Specialist Progress Note:   05/14/24 1035  Mobility  Activity Pivoted/transferred from bed to chair  Level of Assistance Minimal assist, patient does 75% or more  Assistive Device Front wheel walker  Distance Ambulated (ft) 5 ft  Activity Response Tolerated well  Mobility Referral Yes  Mobility visit 1 Mobility  Mobility Specialist Start Time (ACUTE ONLY) 0910  Mobility Specialist Stop Time (ACUTE ONLY) [id number]  Mobility Specialist Time Calculation (min) (ACUTE ONLY) 12 min   Received pt in bed and agreeable to mobility. Found pt on RA. Pt required MinA STS, otherwise MinG. Pt's SPO2 dropped to 85%, increased pt's SPO2 to 1 L/min. O2 recovered WFL. Pt had no c/o. Left in chair with alarm on. Personal belongings and call light within reach. All needs met. RN and NT aware.   Lavanda Pollack Mobility Specialist  Please contact via [employer] or  Rehab Office [phone]

## 2024-05-14 NOTE — Progress Notes (Signed)
 PROGRESS NOTE    Scott Vasquez  FMW:[id number] DOB: 09-13-1940 DOA: 05/11/2024 PCP: [place], Betty G, MD  Outpatient Specialists:     Brief Narrative:  Patient is an 83 year old male with past medical history significant for type 2 diabetes mellitus, hypertension, CVA and iron deficiency.  Patient was admitted with altered mental status and hypotension.  On arrival to the emergency room, patient was hypoxic, with O2 sat of 79% on room air.  Leukocytosis was noted on presentation.  Urinalysis done on presentation revealed many bacteria, moderate leukocytes and greater than 50 WBC.  Chest x-ray revealed hypoventilatory changes with patchy atelectasis or mild infiltrate at the left base.  Patient is on IV ceftriaxone and azithromycin.  Urine and blood cultures have been negative to date.  Repeat chest x-ray done on 05/13/2024 revealed markedly lower lung volumes with streaky bibasilar atelectasis.  Continue incentive spirometry.  Add flutter valve device therapy.  Patient will complete course of antibiotics.  Plan is to pursue SNF.    05/14/2024: Patient seen.  No new complaints.  Patient is not particularly noted historian.  No repeat CBC visualized today.  Assessment & Plan:   Principal Problem:   Acute hypoxemic respiratory failure (HCC) Active Problems:   Hyperlipidemia   Essential hypertension   CEREBROVASCULAR ACCIDENT, HX OF   DM2 (diabetes mellitus, type 2) (HCC)   Moderate dementia with behavioral disturbance (HCC)   CKD (chronic kidney disease), stage III (HCC)   Hypothyroidism   Urinary incontinence   UTI (urinary tract infection)   CAP (community acquired pneumonia)   Acute metabolic encephalopathy and hypotension:  - Etiology is likely multifactorial.  On presentation, there were concerns for possible UTI and/or pneumonia. - Patient was also volume depleted. -Patient has been adequately hydrated.  Will discontinue normal saline. - Complete course of antibiotics  (Rocephin and azithromycin). -Urine and blood cultures have been negative to date. -Incentive spirometry. -Flutter valve device therapy. -Follow repeat CBC.   Acute hypoxic respiratory failure due to community-acquired pneumonia:  -Started on Rocephin and Zithromax.   - Negative cultures to date. - Repeat chest x-ray revealed significant atelectasis. - Incentive spirometry and flutter valve device therapy.     Possible UTI:  - Urine culture has been negative.     History of urinary incontinence:    Hypothyroidism:  -Continue levothyroxine .   Hypertension/Hypotension:  -BP is reasonably controlled. - Last documented blood pressure of 142/70 mmHg. - Discontinue normal saline. - Continue to monitor blood pressure closely.      Type 2 diabetes:  -Continue to hold glipizide .   -Sliding scale insulin  -Blood sugar is reasonably controlled.   Moderate dementia:  -No behavioral problem   Hyperlipidemia:  -Continue statin  DVT prophylaxis:  Code Status: Full code Family Communication: Called and updated patient's wife, [name] Elicker. Disposition Plan: Patient remains inpatient appropriate.  SNF is planned.   Consultants:  None.  Procedures:  None.  Antimicrobials:  IV Rocephin. IV azithromycin   Subjective: No significant history from patient  Objective: Vitals:   05/14/24 0122 05/14/24 0428 05/14/24 0600 05/14/24 0843  BP:  (!) 147/74  (!) 142/70  Pulse:  (!) 52  61  Resp:  17  19  Temp:  98.2 F (36.8 C)  98.2 F (36.8 C)  TempSrc:  Oral    SpO2: 100% 100%  100%  Weight:   73.7 kg   Height:   5' 9 (1.753 m)     Intake/Output Summary (Last 24 hours) at 05/14/2024  1010 Last data filed at 05/14/2024 0555 Gross per 24 hour  Intake 2989.03 ml  Output 1750 ml  Net 1239.03 ml   Filed Weights   05/14/24 0600  Weight: 73.7 kg    Examination:  General exam: Appears calm and comfortable  Respiratory system: Clear to auscultation.  Cardiovascular  system: S1 & S2, soft systolic murmur.  Gastrointestinal system: Abdomen is soft and nontender.  Central nervous system: Awake and alert.  Very left-sided weakness.   Data Reviewed: I have personally reviewed following labs and imaging studies  CBC: Recent Labs  Lab 05/11/24 1823 05/12/24 0326 05/13/24 0751  WBC 18.1* 16.7* 14.3*  NEUTROABS  --   --  11.9*  HGB 11.2* 10.6* 9.2*  HCT 35.1* 33.4* 29.0*  MCV 90.9 90.0 90.6  PLT 229 245 216   Basic Metabolic Panel: Recent Labs  Lab 05/11/24 1823 05/12/24 0326 05/13/24 0751  NA 135 137 137  K 4.0 3.7 3.9  CL 97* 103 105  CO2 25 26 26   GLUCOSE 152* 129* 198*  BUN 17 14 18   CREATININE 1.49* 1.22 1.18  CALCIUM  9.1 8.7* 8.3*  MG  --   --  1.6*  PHOS  --   --  2.3*   GFR: Estimated Creatinine Clearance: 45.8 mL/min (by C-G formula based on SCr of 1.18 mg/dL). Liver Function Tests: Recent Labs  Lab 05/11/24 1823 05/12/24 0326 05/13/24 0751  AST 19 22  --   ALT 16 16  --   ALKPHOS 121 107  --   BILITOT 1.0 0.6  --   PROT 6.7 6.7  --   ALBUMIN 3.6 3.4* 2.7*   No results for input(s): LIPASE, AMYLASE in the last 168 hours. No results for input(s): AMMONIA in the last 168 hours. Coagulation Profile: No results for input(s): INR, PROTIME in the last 168 hours. Cardiac Enzymes: No results for input(s): CKTOTAL, CKMB, CKMBINDEX, TROPONINI in the last 168 hours. BNP (last 3 results) No results for input(s): PROBNP in the last 8760 hours. HbA1C: Recent Labs    05/12/24 0326  HGBA1C 5.4   CBG: Recent Labs  Lab 05/13/24 0811 05/13/24 1102 05/13/24 1711 05/13/24 2111 05/14/24 0841  GLUCAP 195* 157* 198* 167* 109*   Lipid Profile: No results for input(s): CHOL, HDL, LDLCALC, TRIG, CHOLHDL, LDLDIRECT in the last 72 hours. Thyroid  Function Tests: No results for input(s): TSH, T4TOTAL, FREET4, T3FREE, THYROIDAB in the last 72 hours. Anemia Panel: No results for input(s):  VITAMINB12, FOLATE, FERRITIN, TIBC, IRON, RETICCTPCT in the last 72 hours. Urine analysis:    Component Value Date/Time   COLORURINE AMBER (A) 05/12/2024 0335   APPEARANCEUR CLOUDY (A) 05/12/2024 0335   LABSPEC 1.019 05/12/2024 0335   PHURINE 5.0 05/12/2024 0335   GLUCOSEU 50 (A) 05/12/2024 0335   HGBUR LARGE (A) 05/12/2024 0335   BILIRUBINUR NEGATIVE 05/12/2024 0335   BILIRUBINUR Neg 07/24/2016 1038   KETONESUR NEGATIVE 05/12/2024 0335   PROTEINUR 100 (A) 05/12/2024 0335   UROBILINOGEN negative 07/24/2016 1038   UROBILINOGEN 0.2 10/20/2014 0220   NITRITE NEGATIVE 05/12/2024 0335   LEUKOCYTESUR NEGATIVE 05/12/2024 0335   Sepsis Labs: @LABRCNTIP (procalcitonin:4,lacticidven:4)  ) Recent Results (from the past 240 hours)  Resp panel by RT-PCR (RSV, Flu A&B, Covid) Urine, In & Out Cath     Status: None   Collection Time: 05/11/24  7:00 PM   Specimen: Urine, In & Out Cath; Nasal Swab  Result Value Ref Range Status   SARS Coronavirus 2 by RT PCR  NEGATIVE NEGATIVE Final   Influenza A by PCR NEGATIVE NEGATIVE Final   Influenza B by PCR NEGATIVE NEGATIVE Final    Comment: (NOTE) The Xpert Xpress SARS-CoV-2/FLU/RSV plus assay is intended as an aid in the diagnosis of influenza from Nasopharyngeal swab specimens and should not be used as a sole basis for treatment. Nasal washings and aspirates are unacceptable for Xpert Xpress SARS-CoV-2/FLU/RSV testing.  Fact Sheet for Patients: BloggerCourse.com  Fact Sheet for Healthcare Providers: SeriousBroker.it  This test is not yet approved or cleared by the [place]  FDA and has been authorized for detection and/or diagnosis of SARS-CoV-2 by FDA under an Emergency Use Authorization (EUA). This EUA will remain in effect (meaning this test can be used) for the duration of the COVID-19 declaration under Section 564(b)(1) of the Act, 21 U.S.C. section 360bbb-3(b)(1), unless  the authorization is terminated or revoked.     Resp Syncytial Virus by PCR NEGATIVE NEGATIVE Final    Comment: (NOTE) Fact Sheet for Patients: BloggerCourse.com  Fact Sheet for Healthcare Providers: SeriousBroker.it  This test is not yet approved or cleared by the [place]  FDA and has been authorized for detection and/or diagnosis of SARS-CoV-2 by FDA under an Emergency Use Authorization (EUA). This EUA will remain in effect (meaning this test can be used) for the duration of the COVID-19 declaration under Section 564(b)(1) of the Act, 21 U.S.C. section 360bbb-3(b)(1), unless the authorization is terminated or revoked.  Performed at [facility], [address] [place], [place] [address]   Culture, MAINE Urine     Status: None   Collection Time: 05/12/24  8:38 PM   Specimen: Urine, Clean Catch  Result Value Ref Range Status   Specimen Description URINE, CLEAN CATCH  Final   Special Requests NONE  Final   Culture   Final    NO GROWTH Performed at [facility], [address], [place], [place] [address]    Report Status 05/13/2024 FINAL  Final  Culture, blood (Routine X 2) w Reflex to ID Panel     Status: None (Preliminary result)   Collection Time: 05/12/24 10:14 PM   Specimen: BLOOD LEFT HAND  Result Value Ref Range Status   Specimen Description BLOOD LEFT HAND  Final   Special Requests   Final    BOTTLES DRAWN AEROBIC AND ANAEROBIC Blood Culture results may not be optimal due to an inadequate volume of blood received in culture bottles   Culture   Final    NO GROWTH 2 DAYS Performed at [facility], [address], [place], [place] [address]    Report Status PENDING  Incomplete  Culture, blood (Routine X 2) w Reflex to ID Panel     Status: None (Preliminary result)   Collection Time: 05/12/24 10:16 PM   Specimen: BLOOD LEFT ARM  Result Value Ref Range Status   Specimen Description BLOOD LEFT ARM   Final   Special Requests   Final    BOTTLES DRAWN AEROBIC AND ANAEROBIC Blood Culture adequate volume   Culture   Final    NO GROWTH 2 DAYS Performed at [facility], [address], [place], [place] [address]    Report Status PENDING  Incomplete         Radiology Studies: DG CHEST PORT 1 VIEW Result Date: 05/13/2024 CLINICAL DATA:  [id number] Pneumonia [id number] EXAM: PORTABLE CHEST - 1 VIEW COMPARISON:  May 11, 2024 FINDINGS: Markedly low lung volumes with bronchovascular crowding. Persistent  streaky atelectasis in the left lung base with streaky atelectasis in the right lung base. No new airspace consolidation, pleural effusion, or pneumothorax. No cardiomegaly. Aortic atherosclerosis. Multilevel thoracic osteophytosis. IMPRESSION: Markedly lower lung volumes with streaky bibasilar atelectasis. Electronically Signed   By: Rogelia Myers M.D.   On: 05/13/2024 12:23        Scheduled Meds:  amLODipine   2.5 mg Oral Daily   atorvastatin   80 mg Oral Daily   azithromycin  500 mg Oral QHS   citalopram   20 mg Oral Daily   donepezil   10 mg Oral QHS   insulin  aspart  0-5 Units Subcutaneous QHS   insulin  aspart  0-9 Units Subcutaneous TID WC   levothyroxine   50 mcg Oral Daily   memantine   10 mg Oral BID   Continuous Infusions:  cefTRIAXone (ROCEPHIN)  IV Stopped (05/13/24 1745)     LOS: 3 days    Time spent: 35 minutes.    Leatrice Chapel, MD  [facility] Pager #: [phone] 7PM-7AM contact night coverage as above

## 2024-05-15 ENCOUNTER — Telehealth: Payer: Self-pay | Admitting: *Deleted

## 2024-05-15 ENCOUNTER — Telehealth: Payer: Self-pay | Admitting: Family Medicine

## 2024-05-15 ENCOUNTER — Telehealth: Payer: Self-pay

## 2024-05-15 DIAGNOSIS — J9601 Acute respiratory failure with hypoxia: Secondary | ICD-10-CM | POA: Diagnosis not present

## 2024-05-15 LAB — GLUCOSE, CAPILLARY
Glucose-Capillary: 134 mg/dL — ABNORMAL HIGH (ref 70–99)
Glucose-Capillary: 164 mg/dL — ABNORMAL HIGH (ref 70–99)
Glucose-Capillary: 246 mg/dL — ABNORMAL HIGH (ref 70–99)
Glucose-Capillary: 95 mg/dL (ref 70–99)

## 2024-05-15 MED ORDER — GLIPIZIDE ER 5 MG PO TB24
5.0000 mg | ORAL_TABLET | Freq: Every day | ORAL | Status: DC
  Administered 2024-05-15 – 2024-05-16 (×2): 5 mg via ORAL
  Filled 2024-05-15 (×2): qty 1

## 2024-05-15 NOTE — Telephone Encounter (Signed)
 Copied from CRM [id number]. Topic: General - Other >> May 15, 2024  9:39 AM Zebedee SAUNDERS wrote: Reason for CRM: Pt's son Norvil Martensen [phone] called to inform clinic pt is in hospital for pneumonia, low blood pressure.

## 2024-05-15 NOTE — Plan of Care (Signed)
  Problem: Education: Goal: Ability to describe self-care measures that may prevent or decrease complications (Diabetes Survival Skills Education) will improve Outcome: Progressing Goal: Individualized Educational Video(s) Outcome: Progressing   Problem: Coping: Goal: Ability to adjust to condition or change in health will improve Outcome: Progressing   Problem: Fluid Volume: Goal: Ability to maintain a balanced intake and output will improve Outcome: Progressing   Problem: Health Behavior/Discharge Planning: Goal: Ability to identify and utilize available resources and services will improve Outcome: Progressing Goal: Ability to manage health-related needs will improve Outcome: Progressing   Problem: Metabolic: Goal: Ability to maintain appropriate glucose levels will improve Outcome: Progressing   Problem: Nutritional: Goal: Maintenance of adequate nutrition will improve Outcome: Progressing Goal: Progress toward achieving an optimal weight will improve Outcome: Progressing   Problem: Skin Integrity: Goal: Risk for impaired skin integrity will decrease Outcome: Progressing   Problem: Tissue Perfusion: Goal: Adequacy of tissue perfusion will improve Outcome: Progressing   Problem: Activity: Goal: Ability to tolerate increased activity will improve Outcome: Progressing   Problem: Clinical Measurements: Goal: Ability to maintain a body temperature in the normal range will improve Outcome: Progressing   Problem: Respiratory: Goal: Ability to maintain adequate ventilation will improve Outcome: Progressing Goal: Ability to maintain a clear airway will improve Outcome: Progressing   Problem: Education: Goal: Knowledge of General Education information will improve Description: Including pain rating scale, medication(s)/side effects and non-pharmacologic comfort measures Outcome: Progressing   Problem: Health Behavior/Discharge Planning: Goal: Ability to manage  health-related needs will improve Outcome: Progressing   Problem: Clinical Measurements: Goal: Ability to maintain clinical measurements within normal limits will improve Outcome: Progressing Goal: Will remain free from infection Outcome: Progressing Goal: Diagnostic test results will improve Outcome: Progressing Goal: Respiratory complications will improve Outcome: Progressing Goal: Cardiovascular complication will be avoided Outcome: Progressing   Problem: Activity: Goal: Risk for activity intolerance will decrease Outcome: Progressing   Problem: Nutrition: Goal: Adequate nutrition will be maintained Outcome: Progressing   Problem: Coping: Goal: Level of anxiety will decrease Outcome: Progressing   Problem: Elimination: Goal: Will not experience complications related to bowel motility Outcome: Progressing Goal: Will not experience complications related to urinary retention Outcome: Progressing   Problem: Pain Managment: Goal: General experience of comfort will improve and/or be controlled Outcome: Progressing   Problem: Safety: Goal: Ability to remain free from injury will improve Outcome: Progressing   Problem: Skin Integrity: Goal: Risk for impaired skin integrity will decrease Outcome: Progressing

## 2024-05-15 NOTE — Telephone Encounter (Signed)
 Copied from CRM [id number]. Topic: Clinical - Medication Question >> May 15, 2024  3:32 PM Shereese L wrote: Reason for CRM: patient wife stated that patient is in the hospital and that a script needs to be sent to [facility] for a machine to check the oxygen pulse oximeter

## 2024-05-15 NOTE — Plan of Care (Signed)
  Problem: Coping: Goal: Ability to adjust to condition or change in health will improve Outcome: Progressing   Problem: Skin Integrity: Goal: Risk for impaired skin integrity will decrease Outcome: Progressing   Problem: Activity: Goal: Ability to tolerate increased activity will improve Outcome: Progressing   Problem: Respiratory: Goal: Ability to maintain adequate ventilation will improve Outcome: Progressing   Problem: Respiratory: Goal: Ability to maintain a clear airway will improve Outcome: Progressing   Problem: Education: Goal: Knowledge of General Education information will improve Description: Including pain rating scale, medication(s)/side effects and non-pharmacologic comfort measures Outcome: Progressing   Problem: Clinical Measurements: Goal: Respiratory complications will improve Outcome: Progressing   Problem: Activity: Goal: Risk for activity intolerance will decrease Outcome: Progressing   Problem: Nutrition: Goal: Adequate nutrition will be maintained Outcome: Progressing   Problem: Elimination: Goal: Will not experience complications related to bowel motility Outcome: Progressing Goal: Will not experience complications related to urinary retention Outcome: Progressing   Problem: Pain Managment: Goal: General experience of comfort will improve and/or be controlled Outcome: Progressing

## 2024-05-15 NOTE — TOC Progression Note (Signed)
 Transition of Care [facility]) - Progression Note    Patient Details  Name: Scott Vasquez MRN: [id number] Date of Birth: October 21, 1940  [facility] CM/SW Contact  [name], [place] Phone Number: 05/15/2024, 3:21 PM  Clinical Narrative:  CSW spoke to pt's wife Scott Vasquez via phone. Pt is disoriented x3.  CSW informed the pt of PT recs for SNF. Scott Vasquez stated that she does not want the pt transition to a SNF but wants HH. CSW explained that HH would not be present everyday but 2-3 times a week. Scott Vasquez stated understanding and still requested HH and requested for the CSW to speak to the son Scott Vasquez. RNCM notified.  CSW spoke to [place] via phone and stated that he would like to know why SNF is need instead of home health. CSW stated that patients are recommended for SNF when they require more intensive PT. Scott Vasquez stated he would like to speak to the MD to better understand. CSW notified MD via secure chat.  CSW will continue to monitor.                      Expected Discharge Plan and Services                                               Social Drivers of Health (SDOH) Interventions SDOH Screenings   Food Insecurity: No Food Insecurity (05/12/2024)  Housing: Low Risk  (05/12/2024)  Transportation Needs: Patient Unable To Answer (05/12/2024)  Utilities: Not At Risk (05/12/2024)  Depression (PHQ2-9): Low Risk  (07/15/2023)  Financial Resource Strain: Low Risk  (05/09/2021)  Social Connections: Moderately Isolated (05/12/2024)  Tobacco Use: Medium Risk (05/11/2024)    Readmission Risk Interventions    05/12/2024   11:23 AM  Readmission Risk Prevention Plan  Post Dischage Appt Complete  Medication Screening Complete  Transportation Screening Complete

## 2024-05-15 NOTE — Progress Notes (Signed)
 Physical Therapy Treatment Patient Details Name: Scott Vasquez MRN: [id number] DOB: 03-21-1941 Today's Date: 05/15/2024   History of Present Illness 83 year old male  admitted 10/2 with altered mental status and hypotension. On arrival to the emergency room, patient was hypoxic, with O2 sat of 79% on room air. Dx Acute hypoxic respiratory failure due to community-acquired pneumonia and possible UTI. PMH: type 2 diabetes mellitus, hypertension, CVA and iron deficiency.    PT Comments  Continuing work on functional mobility and activity tolerance;  Session focused on progressive amb and functional transfers, with very nice progress towards goals;  Able to clear feet well but needs cues for upright posture and closer proximity to Rollator for max safety/support. up to mod assist to control rollator. No overt buckling. Played music (Blues) while walking, and pt was smiling much of the time; In considering options for discharge, I value going back to familiar environment, caregivers, and routines for Scott Vasquez; If family can provide consistent up to mod physical assist, then I recommend getting him home with HHPT follow up     If plan is discharge home, recommend the following: A little help with walking and/or transfers;A lot of help with bathing/dressing/bathroom;Assistance with cooking/housework;Assist for transportation;Help with stairs or ramp for entrance   Can travel by private vehicle     Yes  Equipment Recommendations  None recommended by PT    Recommendations for Other Services       Precautions / Restrictions Precautions Precautions: Fall Recall of Precautions/Restrictions: Impaired Precaution/Restrictions Comments: monitor O2 Restrictions Weight Bearing Restrictions Per Provider Order: No     Mobility  Bed Mobility Overal bed mobility: Needs Assistance Bed Mobility: Supine to Sit     Supine to sit: Min assist, HOB elevated     General bed mobility comments: Inct  time, but very nice effort; used bedrails; min assist to help scoot hips around to square off at EOB    Transfers Overall transfer level: Needs assistance Equipment used: Rolling walker (2 wheels) Transfers: Sit to/from Stand Sit to Stand: Min assist           General transfer comment: Min assist for boost to stand. Cues for hand placement.    Ambulation/Gait Ambulation/Gait assistance: Min assist, Mod assist Gait Distance (Feet): 70 Feet Assistive device: Rollator (4 wheels) Gait Pattern/deviations: Step-through pattern, Decreased stride length, Trunk flexed Gait velocity: dec     General Gait Details: Cues for technique to mobilize safely with rollator. Able to clear feet well but needs cues for upright posture and closer proximity to Rollator for max safety/support. up to mod assist to control rollator. No overt buckling. Played music (Blues) while walking, and pt was smiling much of the time   Stairs             Wheelchair Mobility     Tilt Bed    Modified Rankin (Stroke Patients Only)       Balance     Sitting balance-Leahy Scale: Good       Standing balance-Leahy Scale: Poor                              Communication Communication Communication: Impaired Factors Affecting Communication: Reduced clarity of speech  Cognition Arousal: Alert Behavior During Therapy: WFL for tasks assessed/performed, Flat affect   PT - Cognitive impairments: No family/caregiver present to determine baseline, Orientation, Awareness, Memory, Attention, Initiation, Sequencing, Safety/Judgement, Problem solving   Orientation impairments: Time,  Situation                   PT - Cognition Comments: Oriented to self and location. Unaware of month or year, situation. Did not speak much, but tells me he enjoys listening ot the blues Following commands: Impaired Following commands impaired: Follows one step commands inconsistently    Cueing Cueing  Techniques: Gestural cues, Verbal cues, Tactile cues  Exercises      General Comments General comments (skin integrity, edema, etc.): VSS on 2L supplemental O2      Pertinent Vitals/Pain Pain Assessment Pain Assessment: No/denies pain    Home Living                          Prior Function            PT Goals (current goals can now be found in the care plan section) Acute Rehab PT Goals Patient Stated Goal: none stated PT Goal Formulation: Patient unable to participate in goal setting Time For Goal Achievement: 05/27/24 Potential to Achieve Goals: Good Progress towards PT goals: Progressing toward goals    Frequency    Min 2X/week      PT Plan      Co-evaluation              AM-PAC PT 6 Clicks Mobility   Outcome Measure  Help needed turning from your back to your side while in a flat bed without using bedrails?: A Little Help needed moving from lying on your back to sitting on the side of a flat bed without using bedrails?: A Little Help needed moving to and from a bed to a chair (including a wheelchair)?: A Little Help needed standing up from a chair using your arms (e.g., wheelchair or bedside chair)?: A Little Help needed to walk in hospital room?: A Lot Help needed climbing 3-5 steps with a railing? : A Lot 6 Click Score: 16    End of Session Equipment Utilized During Treatment: Gait belt;Oxygen Activity Tolerance: Patient tolerated treatment well Patient left: in chair;with call bell/phone within reach;with chair alarm set Nurse Communication: Mobility status PT Visit Diagnosis: Unsteadiness on feet (R26.81);Other abnormalities of gait and mobility (R26.89);Muscle weakness (generalized) (M62.81);Difficulty in walking, not elsewhere classified (R26.2);Other symptoms and signs involving the nervous system (R29.898)     Time: 1359-1440 PT Time Calculation (min) (ACUTE ONLY): 41 min  Charges:    $Gait Training: 23-37 mins $Therapeutic  Activity: 8-22 mins PT General Charges $$ ACUTE PT VISIT: 1 Visit                     Silvano Currier, PT  Acute Rehabilitation Services Office [phone] Secure Chat welcomed    Silvano VEAR Currier 05/15/2024, 3:45 PM

## 2024-05-15 NOTE — Progress Notes (Signed)
 PROGRESS NOTE  Scott Vasquez  DOB: Jun 18, 1941  PCP: [place], Betty G, MD FMW:[id number]  DOA: 05/11/2024  LOS: 4 days  Hospital Day: 5  Subjective: Patient was seen and examined this morning. Elderly African-American male.  Propped up in bed.  Not in distress.  On low-flow oxygen. Family not at bedside.  Alert respond.  Oriented to place and person. Chart reviewed. In the last 24 hours, afebrile, heart rate in 60s, blood pressure 140s Hemoglobin was 9.8 yesterday.   Brief narrative: Scott Vasquez is a 83 y.o. male with PMH significant for DM2, HTN, HLD, CVA, colonic diverticulosis, iron deficiency anemia 10/2, patient presented to the ED with complaint of altered mental status, lethargy, low blood pressure.  In the ED, patient was also noted to be hypoxic at 79% on room air, blood pressure in 90s, Urinalysis showed many bacteria, moderate leukocytes Chest x-ray also suggestive of possible left base pneumonia Blood culture sent, Started on broad-spectrum antibiotics with IV Rocephin and azithromycin Admitted to [facility] Seen by PT.  SNF recommended  Assessment and plan: Acute metabolic encephalopathy Unclear pathology but probably multifactorial: UTI, pneumonia, hypotension Mental status gradually clearing up.  Oriented to place and person.  He knows it is October and it was his birthday 2 days ago.  Left basilar pneumonia Acute respiratory failure with hypoxia Presented with lethargy, hypoxia, leukocytosis Chest x-ray with possible left base pneumonia Started on pursued antibiotic with IV Rocephin and azithromycin (QTc 454 ms on 10/2) WBC count improving.  Negative blood cultures so far Currently on low-flow oxygen Continue incentive spirometry, flutter valve Recent Labs  Lab 05/11/24 1823 05/11/24 2137 05/12/24 0326 05/13/24 0751 05/14/24 1048  WBC 18.1*  --  16.7* 14.3* 9.5  LATICACIDVEN  --  1.3  --   --   --     UTI H/o urinary  incontinence Urinalysis showed many bacteria, moderate leukocytes Antibiotic plan as above.  Hypotension  h/o hypertension Initially hypotensive but without lactic acid elevation.  Blood pressure improved with hydration. PTA meds- amlodipine  2.5 mg daily. Blood pressure controlled on the same  Type 2 diabetes mellitus A1c 5.4 on 05/12/2024 PTA meds-glipizide  10 mg daily, Ozempic Currently on SSI/Accu-Cheks.  Blood sugar level mostly elevated.  Plan to resume glipizide  today at low-dose of 5 mg daily Recent Labs  Lab 05/14/24 1101 05/14/24 1630 05/14/24 2023 05/15/24 0751 05/15/24 1135  GLUCAP 179* 243* 198* 134* 164*   Hypothyroidism Continue levothyroxine .  H/o CVA HLD Continue statin. It is unclear if patient was on Plavix  at home.   Moderate dementia Anxiety/depression No behavioral problem PTA meds- Celexa  20 mg daily, Aricept  10 mg nightly, Namenda  10 mg twice daily Continue all     Mobility:  PT Orders: Active   PT Follow up Rec: Skilled Nursing-Short Term Rehab (<3 Hours/Day) (Anticipated - Pending Progress And Family Support Available.)05/13/2024 1310   Goals of care   Code Status: Full Code     DVT prophylaxis:  Place and maintain sequential compression device Start: 05/12/24 0423   Antimicrobials: IV Rocephin, oral azithromycin Fluid: None Consultants: None currently Family Communication: None at bedside  Status: Inpatient Level of care:  Telemetry Medical   Patient is from: Home Needs to continue in-hospital care: Medically stable.  Pending SNF    Diet:  Diet Order             Diet heart healthy/carb modified Room service appropriate? Yes; Fluid consistency: Thin  Diet effective now  Scheduled Meds:  amLODipine   2.5 mg Oral Daily   atorvastatin   80 mg Oral Daily   azithromycin  500 mg Oral QHS   citalopram   20 mg Oral Daily   donepezil   10 mg Oral QHS   glipiZIDE   5 mg Oral Q breakfast   insulin  aspart   0-5 Units Subcutaneous QHS   insulin  aspart  0-9 Units Subcutaneous TID WC   levothyroxine   50 mcg Oral Daily   memantine   10 mg Oral BID    PRN meds:    Infusions:   cefTRIAXone (ROCEPHIN)  IV Stopped (05/14/24 1847)    Antimicrobials: Anti-infectives (From admission, onward)    Start     Dose/Rate Route Frequency Ordered Stop   05/12/24 2200  azithromycin (ZITHROMAX) tablet 500 mg        500 mg Oral Daily at bedtime 05/12/24 0925 05/16/24 2159   05/12/24 1830  azithromycin (ZITHROMAX) 500 mg in sodium chloride  0.9 % 250 mL IVPB  Status:  Discontinued        500 mg 250 mL/hr over 60 Minutes Intravenous Every 24 hours 05/11/24 2217 05/12/24 0925   05/12/24 1800  cefTRIAXone (ROCEPHIN) 2 g in sodium chloride  0.9 % 100 mL IVPB        2 g 200 mL/hr over 30 Minutes Intravenous Every 24 hours 05/11/24 2217 05/17/24 1759   05/11/24 2200  azithromycin (ZITHROMAX) 500 mg in sodium chloride  0.9 % 250 mL IVPB        500 mg 250 mL/hr over 60 Minutes Intravenous  Once 05/11/24 2157 05/12/24 0122   05/11/24 1915  cefTRIAXone (ROCEPHIN) 2 g in sodium chloride  0.9 % 100 mL IVPB        2 g 200 mL/hr over 30 Minutes Intravenous  Once 05/11/24 1901 05/11/24 1942       Objective: Vitals:   05/15/24 0516 05/15/24 0753  BP: (!) 153/76 (!) 146/74  Pulse: 69 68  Resp: 18 18  Temp: 98 F (36.7 C) 97.9 F (36.6 C)  SpO2: 98% 100%    Intake/Output Summary (Last 24 hours) at 05/15/2024 1243 Last data filed at 05/15/2024 0114 Gross per 24 hour  Intake 1016.93 ml  Output 1200 ml  Net -183.07 ml   Filed Weights   05/14/24 0600  Weight: 73.7 kg   Weight change:  Body mass index is 23.99 kg/m.   Physical Exam: General exam: Pleasant, elderly African-American male Skin: No rashes, lesions or ulcers. HEENT: Atraumatic, normocephalic, no obvious bleeding Lungs: mild diminished entry bilaterally otherwise clear to auscultation CVS: S1, S2, no murmur,   GI/Abd: Soft, nontender,  nondistended, bowel sound present,   CNS: Alert, awake, oriented to place and person Psychiatry: Mood appropriate Extremities: No pedal edema, no calf tenderness,   Data Review: I have personally reviewed the laboratory data and studies available.  F/u labs  Unresulted Labs (From admission, onward)     Start     Ordered   05/12/24 2012  Expectorated Sputum Assessment w Gram Stain, Rflx to Resp Cult  Once,   R        05/12/24 2011            Signed, Chapman Rota, MD [facility] 05/15/2024

## 2024-05-15 NOTE — Telephone Encounter (Signed)
 Copied from CRM [id number]. Topic: Clinical - Medical Advice >> May 15, 2024  3:59 PM Thersia BROCKS wrote: Reason for CRM: Patient son called in stated wanted to know if there is anything else that can be given to the patient like ozempic, stated patient is getting weak and losing weight and they stopped taking it

## 2024-05-16 ENCOUNTER — Other Ambulatory Visit (HOSPITAL_COMMUNITY): Payer: Self-pay

## 2024-05-16 LAB — GLUCOSE, CAPILLARY
Glucose-Capillary: 84 mg/dL (ref 70–99)
Glucose-Capillary: 132 mg/dL — ABNORMAL HIGH (ref 70–99)

## 2024-05-16 MED ORDER — AMOXICILLIN-POT CLAVULANATE 875-125 MG PO TABS
1.0000 | ORAL_TABLET | Freq: Two times a day (BID) | ORAL | 0 refills | Status: AC
  Filled 2024-05-16: qty 6, 3d supply, fill #0

## 2024-05-16 MED ORDER — GLIPIZIDE ER 5 MG PO TB24: 5.0000 mg | ORAL_TABLET | Freq: Every day | ORAL | Status: AC

## 2024-05-16 MED ORDER — CLOPIDOGREL BISULFATE 75 MG PO TABS
75.0000 mg | ORAL_TABLET | Freq: Every day | ORAL | Status: DC
Start: 2024-05-16 — End: 2024-05-16
  Administered 2024-05-16: 75 mg via ORAL
  Filled 2024-05-16: qty 1

## 2024-05-16 MED ORDER — AMOXICILLIN-POT CLAVULANATE 875-125 MG PO TABS
1.0000 | ORAL_TABLET | Freq: Two times a day (BID) | ORAL | Status: DC
  Administered 2024-05-16: 1 via ORAL
  Filled 2024-05-16: qty 1

## 2024-05-16 NOTE — Progress Notes (Signed)
 Occupational Therapy Treatment Patient Details Name: Scott Vasquez MRN: [id number] DOB: 08-29-1940 Today's Date: 05/16/2024   History of present illness 83 year old male  admitted 10/2 with altered mental status and hypotension. On arrival to the emergency room, patient was hypoxic, with O2 sat of 79% on room air. Dx Acute hypoxic respiratory failure due to community-acquired pneumonia and possible UTI. PMH: type 2 diabetes mellitus, hypertension, CVA and iron deficiency.   OT comments  Pt making progress with functional goals. Pt seated in recliner upon arrival. Pt agreeable to walk to bathroom with RW, requiring min A, cues for hand placement and safety, keeping RW close. Pt transferred to commode min A, max A with toileting tasks. Pt stood at sink for grooming/hygiene CGA. Pt returned to sitting in recliner where he participated in UB ADLs CGA. Pt very pleasant and cooperative. OT will continue to follow acutely to maximize level of function and safety      If plan is discharge home, recommend the following:  A little help with walking and/or transfers;A lot of help with bathing/dressing/bathroom;Assistance with cooking/housework;Direct supervision/assist for medications management;Direct supervision/assist for financial management;Assist for transportation;Help with stairs or ramp for entrance;Supervision due to cognitive status   Equipment Recommendations  None recommended by OT    Recommendations for Other Services      Precautions / Restrictions Precautions Precautions: Fall Recall of Precautions/Restrictions: Impaired Precaution/Restrictions Comments: monitor O2 Restrictions Weight Bearing Restrictions Per Provider Order: No       Mobility Bed Mobility               General bed mobility comments: pt in chair    Transfers Overall transfer level: Needs assistance Equipment used: Rolling walker (2 wheels) Transfers: Sit to/from Stand Sit to Stand: Min assist            General transfer comment: Min assist for boost to stand. Cues for hand placement, and to initiate with forward lean     Balance Overall balance assessment: Needs assistance Sitting-balance support: No upper extremity supported, Feet supported Sitting balance-Leahy Scale: Good     Standing balance support: Bilateral upper extremity supported, Reliant on assistive device for balance Standing balance-Leahy Scale: Poor                             ADL either performed or assessed with clinical judgement   ADL Overall ADL's : Needs assistance/impaired     Grooming: Wash/dry hands;Wash/dry face;Contact guard assist;Standing   Upper Body Bathing: Contact guard assist;Sitting       Upper Body Dressing : Contact guard assist;Minimal assistance;Sitting;Cueing for sequencing;Cueing for compensatory techniques       Toilet Transfer: Minimal assistance;Rolling walker (2 wheels);BSC/3in1;Ambulation;Cueing for safety;Cueing for sequencing   Toileting- Clothing Manipulation and Hygiene: Maximal assistance;Total assistance;Sit to/from stand       Functional mobility during ADLs: Minimal assistance;Rolling walker (2 wheels) General ADL Comments: Pt requiries increased time for tasks. Pt with decreased activity tolerance and cognition. Assist with ADLs at baseline from family    Extremity/Trunk Assessment Upper Extremity Assessment Upper Extremity Assessment: Generalized weakness;Right hand dominant   Lower Extremity Assessment Lower Extremity Assessment: Defer to PT evaluation        Vision Ability to See in Adequate Light: 0 Adequate Patient Visual Report: No change from baseline     Perception     Praxis     Communication Communication Communication: Impaired Factors Affecting Communication: Reduced clarity of speech  Cognition Arousal: Alert Behavior During Therapy: WFL for tasks assessed/performed, Flat affect                                  Following commands: Impaired Following commands impaired: Follows one step commands inconsistently, Only follows one step commands consistently      Cueing   Cueing Techniques: Gestural cues, Verbal cues, Tactile cues  Exercises      Shoulder Instructions       General Comments See other PT note of this date; Spoke with pt's son Vaughan on the phone to update him on his father's funcitonal status and dc plan; Vaughan is confident that they can manage car transfers and steps to enter home    Pertinent Vitals/ Pain          Home Living                                          Prior Functioning/Environment              Frequency  Min 2X/week        Progress Toward Goals  OT Goals(current goals can now be found in the care plan section)  Progress towards OT goals: Progressing toward goals     Plan      Co-evaluation                 AM-PAC OT 6 Clicks Daily Activity     Outcome Measure   Help from another person eating meals?: None Help from another person taking care of personal grooming?: A Little Help from another person toileting, which includes using toliet, bedpan, or urinal?: A Lot Help from another person bathing (including washing, rinsing, drying)?: A Lot Help from another person to put on and taking off regular upper body clothing?: A Little Help from another person to put on and taking off regular lower body clothing?: A Lot 6 Click Score: 16    End of Session Equipment Utilized During Treatment: Gait belt;Rolling walker (2 wheels)  OT Visit Diagnosis: Unsteadiness on feet (R26.81);Muscle weakness (generalized) (M62.81)   Activity Tolerance Patient tolerated treatment well   Patient Left with call bell/phone within reach;in chair;with chair alarm set   Nurse Communication Mobility status        Time: 1206-1229 OT Time Calculation (min): 23 min  Charges: OT General Charges $OT Visit: 1 Visit OT  Treatments $Self Care/Home Management : 8-22 mins $Therapeutic Activity: 8-22 mins    Jacques Karna Loose 05/16/2024, 1:15 PM

## 2024-05-16 NOTE — Telephone Encounter (Signed)
 We can discuss options during follow up visit. Continue monitoring blood sugars. Thanks, BJ

## 2024-05-16 NOTE — Telephone Encounter (Signed)
 Left voicemail for son to return my call.

## 2024-05-16 NOTE — Care Management Important Message (Signed)
 Important Message  Patient Details  Name: Scott Vasquez MRN: [id number] Date of Birth: 1941/05/31   Important Message Given:  Yes - Medicare IM   IM provided to the patient on 10/5   Saylee Sherrill 05/16/2024, 9:49 AM

## 2024-05-16 NOTE — Progress Notes (Signed)
 Physical Therapy Treatment Patient Details Name: Scott Vasquez MRN: [id number] DOB: [birth year] Today's Date: 05/16/2024   History of Present Illness 83 year old male  admitted 10/2 with altered mental status and hypotension. On arrival to the emergency room, patient was hypoxic, with O2 sat of 79% on room air. Dx Acute hypoxic respiratory failure due to community-acquired pneumonia and possible UTI. PMH: type 2 diabetes mellitus, hypertension, CVA and iron deficiency.    PT Comments  Continuing work on functional mobility and activity tolerance;  Session focused on O2 sat walk, and today pt did not need supplemental O2 to keep O2 sats a safe, acceptable levels with physical activity; Noted pt's HR max (observed) was 133 bpm during walk; Smiling throughout walk; mod assist and multimodal cues for upright posture and RW stability and proximity; Updated pt's son Scott Vasquez on plan for DC    If plan is discharge home, recommend the following: A little help with walking and/or transfers;A lot of help with bathing/dressing/bathroom;Assistance with cooking/housework;Assist for transportation;Help with stairs or ramp for entrance   Can travel by private vehicle     Yes  Equipment Recommendations  None recommended by PT    Recommendations for Other Services       Precautions / Restrictions Precautions Precautions: Fall Recall of Precautions/Restrictions: Impaired Precaution/Restrictions Comments: monitor O2 Restrictions Weight Bearing Restrictions Per Provider Order: No     Mobility  Bed Mobility Overal bed mobility: Needs Assistance Bed Mobility: Supine to Sit     Supine to sit: Min assist, HOB elevated     General bed mobility comments: Inct time, but very nice effort;  min assist to help scoot hips around to square off at EOB    Transfers Overall transfer level: Needs assistance Equipment used: Rolling walker (2 wheels) Transfers: Sit to/from Stand Sit to Stand: Min  assist           General transfer comment: Min assist for boost to stand. Cues for hand placement, and to initiate with forward lean    Ambulation/Gait Ambulation/Gait assistance: Min assist, Mod assist Gait Distance (Feet): 70 Feet Assistive device: Rolling walker (2 wheels) (second person assist with vitals machine for O2 sat monitor) Gait Pattern/deviations: Step-through pattern, Decreased stride length, Trunk flexed Gait velocity: dec     General Gait Details: Cues for technique to mobilize safely with RW Able to clear feet well but needs cues for upright posture and closer proximity to RW for max safety/support. up to mod assist to stabilize RW. No overt buckling. Played music (Blues) while walking, and pt was smiling much of the time   Stairs             Wheelchair Mobility     Tilt Bed    Modified Rankin (Stroke Patients Only)       Balance     Sitting balance-Leahy Scale: Good       Standing balance-Leahy Scale: Poor                              Communication Communication Communication: Impaired Factors Affecting Communication: Reduced clarity of speech  Cognition Arousal: Alert Behavior During Therapy: WFL for tasks assessed/performed, Flat affect   PT - Cognitive impairments: No family/caregiver present to determine baseline, Orientation, Awareness, Memory, Attention, Initiation, Sequencing, Safety/Judgement, Problem solving   Orientation impairments: Time, Situation                   PT -  Cognition Comments: Smiled upon PT entry and participated well; Spoke more this session, and looks forward to getting home Following commands: Impaired Following commands impaired: Follows one step commands inconsistently, Only follows one step commands consistently    Cueing Cueing Techniques: Gestural cues, Verbal cues, Tactile cues  Exercises      General Comments General comments (skin integrity, edema, etc.): See other PT note  of this date; Spoke with pt's son Scott Vasquez on the phone to update him on his father's funcitonal status and dc plan; Scott Vasquez is confident that they can manage car transfers and steps to enter home      Pertinent Vitals/Pain Pain Assessment Pain Assessment: No/denies pain    Home Living                          Prior Function            PT Goals (current goals can now be found in the care plan section) Acute Rehab PT Goals Patient Stated Goal: none stated PT Goal Formulation: Patient unable to participate in goal setting Time For Goal Achievement: 05/27/24 Potential to Achieve Goals: Good Progress towards PT goals: Progressing toward goals    Frequency    Min 2X/week      PT Plan      Co-evaluation              AM-PAC PT 6 Clicks Mobility   Outcome Measure  Help needed turning from your back to your side while in a flat bed without using bedrails?: A Little Help needed moving from lying on your back to sitting on the side of a flat bed without using bedrails?: A Little Help needed moving to and from a bed to a chair (including a wheelchair)?: A Little Help needed standing up from a chair using your arms (e.g., wheelchair or bedside chair)?: A Little Help needed to walk in hospital room?: A Lot Help needed climbing 3-5 steps with a railing? : A Lot 6 Click Score: 16    End of Session Equipment Utilized During Treatment: Gait belt;Oxygen Activity Tolerance: Patient tolerated treatment well Patient left: in chair;with call bell/phone within reach;with chair alarm set Nurse Communication: Mobility status PT Visit Diagnosis: Unsteadiness on feet (R26.81);Other abnormalities of gait and mobility (R26.89);Muscle weakness (generalized) (M62.81);Difficulty in walking, not elsewhere classified (R26.2);Other symptoms and signs involving the nervous system (R29.898)     Time: 8949-8882 PT Time Calculation (min) (ACUTE ONLY): 27 min  Charges:    $Gait  Training: 23-37 mins PT General Charges $$ ACUTE PT VISIT: 1 Visit                     Silvano Currier, PT  Acute Rehabilitation Services Office [phone] Secure Chat welcomed    Silvano VEAR Currier 05/16/2024, 12:13 PM

## 2024-05-16 NOTE — Progress Notes (Signed)
 RN and NT got patient dressed and ready IV has been removed. RN called patients wife and talked to her about DC instructions AVS in patient bag. Medications Picked up from [facility] and with patient in his bag.

## 2024-05-16 NOTE — Discharge Summary (Signed)
 Physician Discharge Summary  Scott Vasquez FMW:[id number] DOB: [birth year] DOA: 05/11/2024  PCP: [place], Betty G, MD  Admit date: 05/11/2024 Discharge date: 05/16/2024  Admitted from: Home Discharge disposition: Home with home health PT/OT/RN/home health aide  Recommendations at discharge:  Complete the course of antibiotics with oral Augmentin for next 3 days. Based on your blood sugar level, I have reduced glipizide  dose to 5 mg daily.  Continue to monitor blood sugar level at home.   Subjective: Patient was seen and examined this morning. Lying on bed.  Not in distress.  Family not at bedside. Afebrile, hemodynamically stable with blood pressure in 140s this morning.  Breathing on room air. Ambulated on the hallway today with PT.  Did not require supplemental oxygen. Physical therapy recommendation was for SNF but patient's son wants to take him home with home with services.  Brief narrative: Scott Vasquez is a 83 y.o. male with PMH significant for DM2, HTN, HLD, CVA, colonic diverticulosis, iron deficiency anemia 10/2, patient presented to the ED with complaint of altered mental status, lethargy, low blood pressure.  In the ED, patient was also noted to be hypoxic at 79% on room air, blood pressure in 90s, Urinalysis showed many bacteria, moderate leukocytes Chest x-ray also suggestive of possible left base pneumonia Blood culture sent, Started on broad-spectrum antibiotics with IV Rocephin and azithromycin Admitted to [facility]  Hospital course: Acute metabolic encephalopathy Unclear pathology but probably multifactorial: UTI, pneumonia, hypotension Mental status gradually clearing up.  Not restless or agitated.  Slow to respond but answers orientation questions.  Struggles with time questions  Left basilar pneumonia Acute respiratory failure with hypoxia Presented with lethargy, hypoxia, leukocytosis Chest x-ray with possible left base pneumonia Started on  empiric antibiotic with IV Rocephin and azithromycin (QTc 454 ms on 10/2) WBC count improving.  Negative blood cultures so far Switch antibiotic to oral Augmentin for next 3 days. Breathing on room air.  This morning, ambulated on the hallway today with PT.  Did not require supplemental oxygen. Recent Labs  Lab 05/11/24 1823 05/11/24 2137 05/12/24 0326 05/13/24 0751 05/14/24 1048  WBC 18.1*  --  16.7* 14.3* 9.5  LATICACIDVEN  --  1.3  --   --   --    UTI H/o urinary incontinence Urinalysis showed many bacteria, moderate leukocytes Antibiotic plan as above.  Hypotension  h/o hypertension Initially hypotensive but without lactic acid elevation.  Blood pressure improved with hydration. Blood pressure controlled on amlodipine  2.5 mg daily as before  Type 2 diabetes mellitus A1c 5.4 on 05/12/2024 PTA meds-glipizide  10 mg daily Based on blood sugar level, I have reduced glipizide  dose to 5 mg daily. Recent Labs  Lab 05/15/24 1135 05/15/24 1613 05/15/24 2033 05/16/24 0752 05/16/24 1124  GLUCAP 164* 246* 95 84 132*   Hypothyroidism Continue levothyroxine .  H/o CVA HLD Continue Plavix  and statin as before..   Moderate dementia Anxiety/depression No behavioral problem PTA meds- Celexa  20 mg daily, Aricept  10 mg nightly, Namenda  10 mg twice daily Continue all  Goals of care   Code Status: Full Code   Diet:  Diet Order             Diet general           Diet heart healthy/carb modified Room service appropriate? Yes; Fluid consistency: Thin  Diet effective now                   Nutritional status:  Body mass index is  23.99 kg/m.       Wounds:  -    Discharge Medications:   Allergies as of 05/16/2024       Reactions   Actos  [pioglitazone ] Other (See Comments)   Diarrhea    Metformin  And Related Other (See Comments)   Diarrhea         Medication List     STOP taking these medications    OZEMPIC (0.25 OR 0.5 MG/DOSE) [place]       TAKE  these medications    amLODipine  2.5 MG tablet Commonly known as: NORVASC  Take 1 tablet (2.5 mg total) by mouth daily.   amoxicillin-clavulanate 875-125 MG tablet Commonly known as: AUGMENTIN Take 1 tablet by mouth every 12 (twelve) hours for 3 days.   atorvastatin  80 MG tablet Commonly known as: LIPITOR  Take 1 tablet (80 mg total) by mouth daily. What changed: when to take this   citalopram  20 MG tablet Commonly known as: CELEXA  TAKE 1 TABLET EVERY DAY   clopidogrel  75 MG tablet Commonly known as: PLAVIX  Take 1 tablet (75 mg total) by mouth daily.   donepezil  10 MG tablet Commonly known as: ARICEPT  TAKE 1 TABLET AT BEDTIME   glipiZIDE  5 MG 24 hr tablet Commonly known as: GLUCOTROL  XL Take 1 tablet (5 mg total) by mouth daily with breakfast. Start taking on: May 17, 2024 What changed:  medication strength See the new instructions.   levothyroxine  50 MCG tablet Commonly known as: SYNTHROID  TAKE 1 TABLET EVERY DAY   memantine  10 MG tablet Commonly known as: NAMENDA  TAKE 1 TABLET TWICE DAILY         Follow ups:    Follow-up Information     [facility]. Schedule an appointment as soon as possible for a visit in 6 week(s).   Contact information: [address]  [place]  [address] [phone]        [facility].   Specialty: Home Health Services Why: Someone will call you to schedule first home visit. Contact information: [address] [place] [address] [phone]         [place], Betty G, MD Follow up.   Specialty: Family Medicine Contact information: [address] [place] [address] [phone]                 Discharge Instructions:   Discharge Instructions     Call MD for:  difficulty breathing, headache or visual disturbances   Complete by: As directed    Call MD for:  extreme fatigue   Complete by: As directed    Call MD for:  hives    Complete by: As directed    Call MD for:  persistant dizziness or light-headedness   Complete by: As directed    Call MD for:  persistant nausea and vomiting   Complete by: As directed    Call MD for:  severe uncontrolled pain   Complete by: As directed    Call MD for:  temperature >100.4   Complete by: As directed    Diet general   Complete by: As directed    Discharge instructions   Complete by: As directed    Recommendations at discharge:   Complete the course of antibiotics with oral Augmentin for next 3 days.  Based on your blood sugar level, I have reduced glipizide  dose to 5 mg daily.  Continue to monitor blood sugar level at home.  General discharge instructions:  Follow with Primary MD [place], Betty G, MD in 7 days  Please request your PCP  to go over your hospital tests, procedures, radiology results at the follow up. Please get your medicines reviewed and adjusted.  Your PCP may decide to repeat certain labs or tests as needed. Do not drive, operate heavy machinery, perform activities at heights, swimming or participation in water activities or provide baby sitting services if your were admitted for syncope or siezures until you have seen by Primary MD or a Neurologist and advised to do so again. [place]  Controlled Substance Reporting System database was reviewed. Do not drive, operate heavy machinery, perform activities at heights, swim, participate in water activities or provide baby-sitting services while on medications for pain, sleep and mood until your outpatient physician has reevaluated you and advised to do so again.  You are strongly recommended to comply with the dose, frequency and duration of prescribed medications. Activity: As tolerated with Full fall precautions use walker/cane & assistance as needed Avoid using any recreational substances like cigarette, tobacco, alcohol , or non-prescribed drug. If you experience worsening of your admission symptoms,  develop shortness of breath, life threatening emergency, suicidal or homicidal thoughts you must seek medical attention immediately by calling 911 or calling your MD immediately  if symptoms less severe. You must read complete instructions/literature along with all the possible adverse reactions/side effects for all the medicines you take and that have been prescribed to you. Take any new medicine only after you have completely understood and accepted all the possible adverse reactions/side effects.  Wear Seat belts while driving. You were cared for by a hospitalist during your hospital stay. If you have any questions about your discharge medications or the care you received while you were in the hospital after you are discharged, you can call the unit and ask to speak with the hospitalist or the covering physician. Once you are discharged, your primary care physician will handle any further medical issues. Please note that NO REFILLS for any discharge medications will be authorized once you are discharged, as it is imperative that you return to your primary care physician (or establish a relationship with a primary care physician if you do not have one).   Increase activity slowly   Complete by: As directed        Discharge Exam:   Vitals:   05/15/24 1615 05/15/24 2032 05/16/24 0417 05/16/24 0754  BP: 99/62 (!) 142/72 133/74 (!) 141/78  Pulse: 71 69 66 77  Resp: 18 18 18 18   Temp: 98.2 F (36.8 C) 98.2 F (36.8 C) 98.6 F (37 C) (!) 97.4 F (36.3 C)  TempSrc: Oral Oral Oral Oral  SpO2: 99% 100% 100% 98%  Weight:      Height:        Body mass index is 23.99 kg/m.   General exam: Pleasant, elderly African-American male Skin: No rashes, lesions or ulcers. HEENT: Atraumatic, normocephalic, no obvious bleeding Lungs: mild diminished entry bilaterally otherwise clear to auscultation CVS: S1, S2, no murmur,   GI/Abd: Soft, nontender, nondistended, bowel sound present,   CNS: Alert,  awake, oriented to place and person.  Struggles with time question Psychiatry: Mood appropriate Extremities: No pedal edema, no calf tenderness,    The results of significant diagnostics from this hospitalization (including imaging, microbiology, ancillary and laboratory) are listed below for reference.    Procedures and Diagnostic Studies:   CT HEMATURIA WORKUP Result Date: 05/12/2024 CLINICAL DATA:  Hematuria. EXAM: CT ABDOMEN  AND PELVIS WITHOUT AND WITH CONTRAST TECHNIQUE: Multidetector CT imaging of the abdomen and pelvis was performed following the standard protocol before and following the bolus administration of intravenous contrast. RADIATION DOSE REDUCTION: This exam was performed according to the departmental dose-optimization program which includes automated exposure control, adjustment of the mA and/or kV according to patient size and/or use of iterative reconstruction technique. CONTRAST:  OMNIPAQUE  IOHEXOL  350 MG/ML SOLN COMPARISON:  None Available. FINDINGS: Lower chest: Dependent collapse/consolidation is seen in both lung bases. Hepatobiliary: No suspicious focal abnormality within the liver parenchyma. There is no evidence for gallstones, gallbladder wall thickening, or pericholecystic fluid. No intrahepatic or extrahepatic biliary dilation. Pancreas: No focal mass lesion. No dilatation of the main duct. No intraparenchymal cyst. No peripancreatic edema. Spleen: No splenomegaly. No suspicious focal mass lesion. Trace perisplenic edema noted in the posterior left subdiaphragmatic space. Adrenals/Urinary Tract: Bilateral adrenal gland thickening evident. 13 mm left adrenal nodule has attenuation too high to allow classification as a benign adrenal adenoma Pre contrast imaging shows vascular calcification in the renal hila but no mineralized renal stones. No ureteral or bladder stones. Imaging after IV contrast administration shows no suspicious enhancing lesion in either kidney. 8 mm  subcapsular low-density lesion interpolar right kidney is too small to characterize but is most likely benign. 3.6 cm simple cyst identified interpolar left kidney. Delayed post-contrast imaging shows no wall thickening or soft tissue filling defect in either intrarenal collecting system or renal pelvis. Both ureters are well opacified without evidence for wall thickening, soft tissue lesion or focal dilatation. Delayed imaging of the bladder shows no focal wall thickening or discrete mucosal mass lesion. Stomach/Bowel: Stomach is unremarkable. No gastric wall thickening. No evidence of outlet obstruction. Duodenum is normally positioned as is the ligament of Treitz. No small bowel wall thickening. No small bowel dilatation. The terminal ileum is normal. The appendix is normal. No gross colonic mass. No colonic wall thickening. Vascular/Lymphatic: There is mild atherosclerotic calcification of the abdominal aorta without aneurysm. There is no gastrohepatic or hepatoduodenal ligament lymphadenopathy. No retroperitoneal or mesenteric lymphadenopathy. No pelvic sidewall lymphadenopathy. Reproductive: Prostate gland is enlarged with heterogeneous enhancement. Penile prosthetic device evident. Other: No intraperitoneal free fluid. Musculoskeletal: Degenerative changes are noted in the left hip. No worrisome lytic or sclerotic osseous abnormality. IMPRESSION: 1. No acute findings in the abdomen or pelvis. 2. Marked prostatomegaly. Otherwise, no findings to explain the patient's history of hematuria. Given heterogeneous enhancement of the prostatic parenchyma, correlation with serum PSA levels recommended. 3. 13 mm left adrenal nodule has attenuation too high to allow classification as a benign adrenal adenoma. 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 4. Dependent  collapse/consolidation in both lung bases. 5.  Aortic Atherosclerosis (ICD10-I70.0). Electronically Signed   By: Camellia Candle M.D.   On: 05/12/2024 10:31   DG Chest Portable 1 View Result Date: 05/11/2024 CLINICAL DATA:  Hypotension fatigue lethargy EXAM: PORTABLE CHEST 1 VIEW COMPARISON:  01/01/2022 FINDINGS: Hypoventilatory change. Patchy atelectasis or mild infiltrate left lung base. Stable cardiomediastinal silhouette. No pneumothorax. Probable scarring at the right base. IMPRESSION: Hypoventilatory change with patchy atelectasis or mild infiltrate at the left base. Electronically Signed   By: Luke Bun M.D.   On: 05/11/2024 20:11     Labs:   Basic Metabolic Panel: Recent Labs  Lab 05/11/24 1823 05/12/24 0326 05/13/24 0751  NA  135 137 137  K 4.0 3.7 3.9  CL 97* 103 105  CO2 25 26 26   GLUCOSE 152* 129* 198*  BUN 17 14 18   CREATININE 1.49* 1.22 1.18  CALCIUM  9.1 8.7* 8.3*  MG  --   --  1.6*  PHOS  --   --  2.3*   GFR Estimated Creatinine Clearance: 45.8 mL/min (by C-G formula based on SCr of 1.18 mg/dL). Liver Function Tests: Recent Labs  Lab 05/11/24 1823 05/12/24 0326 05/13/24 0751  AST 19 22  --   ALT 16 16  --   ALKPHOS 121 107  --   BILITOT 1.0 0.6  --   PROT 6.7 6.7  --   ALBUMIN 3.6 3.4* 2.7*   No results for input(s): LIPASE, AMYLASE in the last 168 hours. No results for input(s): AMMONIA in the last 168 hours. Coagulation profile No results for input(s): INR, PROTIME in the last 168 hours.  CBC: Recent Labs  Lab 05/11/24 1823 05/12/24 0326 05/13/24 0751 05/14/24 1048  WBC 18.1* 16.7* 14.3* 9.5  NEUTROABS  --   --  11.9* 7.4  HGB 11.2* 10.6* 9.2* 9.8*  HCT 35.1* 33.4* 29.0* 30.9*  MCV 90.9 90.0 90.6 90.1  PLT 229 245 216 247   Cardiac Enzymes: No results for input(s): CKTOTAL, CKMB, CKMBINDEX, TROPONINI in the last 168 hours. BNP: Invalid input(s): POCBNP CBG: Recent Labs  Lab 05/15/24 1135 05/15/24 1613  05/15/24 2033 05/16/24 0752 05/16/24 1124  GLUCAP 164* 246* 95 84 132*   D-Dimer No results for input(s): DDIMER in the last 72 hours. Hgb A1c No results for input(s): HGBA1C in the last 72 hours. Lipid Profile No results for input(s): CHOL, HDL, LDLCALC, TRIG, CHOLHDL, LDLDIRECT in the last 72 hours. Thyroid  function studies No results for input(s): TSH, T4TOTAL, T3FREE, THYROIDAB in the last 72 hours.  Invalid input(s): FREET3 Anemia work up No results for input(s): VITAMINB12, FOLATE, FERRITIN, TIBC, IRON, RETICCTPCT in the last 72 hours. Microbiology Recent Results (from the past 240 hours)  Resp panel by RT-PCR (RSV, Flu A&B, Covid) Urine, In & Out Cath     Status: None   Collection Time: 05/11/24  7:00 PM   Specimen: Urine, In & Out Cath; Nasal Swab  Result Value Ref Range Status   SARS Coronavirus 2 by RT PCR NEGATIVE NEGATIVE Final   Influenza A by PCR NEGATIVE NEGATIVE Final   Influenza B by PCR NEGATIVE NEGATIVE Final    Comment: (NOTE) The Xpert Xpress SARS-CoV-2/FLU/RSV plus assay is intended as an aid in the diagnosis of influenza from Nasopharyngeal swab specimens and should not be used as a sole basis for treatment. Nasal washings and aspirates are unacceptable for Xpert Xpress SARS-CoV-2/FLU/RSV testing.  Fact Sheet for Patients: BloggerCourse.com  Fact Sheet for Healthcare Providers: SeriousBroker.it  This test is not yet approved or cleared by the [place]  FDA and has been authorized for detection and/or diagnosis of SARS-CoV-2 by FDA under an Emergency Use Authorization (EUA). This EUA will remain in effect (meaning this test can be used) for the duration of the COVID-19 declaration under Section 564(b)(1) of the Act, 21 U.S.C. section 360bbb-3(b)(1), unless the authorization is terminated or revoked.     Resp Syncytial Virus by PCR NEGATIVE NEGATIVE Final     Comment: (NOTE) Fact Sheet for Patients: BloggerCourse.com  Fact Sheet for Healthcare Providers: SeriousBroker.it  This test is not yet approved or cleared by the [place]  FDA and has been authorized for detection and/or  diagnosis of SARS-CoV-2 by FDA under an Emergency Use Authorization (EUA). This EUA will remain in effect (meaning this test can be used) for the duration of the COVID-19 declaration under Section 564(b)(1) of the Act, 21 U.S.C. section 360bbb-3(b)(1), unless the authorization is terminated or revoked.  Performed at [facility], [address], [place], [place] [address]   Culture, MAINE Urine     Status: None   Collection Time: 05/12/24  8:38 PM   Specimen: Urine, Clean Catch  Result Value Ref Range Status   Specimen Description URINE, CLEAN CATCH  Final   Special Requests NONE  Final   Culture   Final    NO GROWTH Performed at [facility], [address], [place], [place] [address]    Report Status 05/13/2024 FINAL  Final  Culture, blood (Routine X 2) w Reflex to ID Panel     Status: None (Preliminary result)   Collection Time: 05/12/24 10:14 PM   Specimen: BLOOD LEFT HAND  Result Value Ref Range Status   Specimen Description BLOOD LEFT HAND  Final   Special Requests   Final    BOTTLES DRAWN AEROBIC AND ANAEROBIC Blood Culture results may not be optimal due to an inadequate volume of blood received in culture bottles   Culture   Final    NO GROWTH 4 DAYS Performed at [facility], [address], [place], [place] [address]    Report Status PENDING  Incomplete  Culture, blood (Routine X 2) w Reflex to ID Panel     Status: None (Preliminary result)   Collection Time: 05/12/24 10:16 PM   Specimen: BLOOD LEFT ARM  Result Value Ref Range Status   Specimen Description BLOOD LEFT ARM  Final   Special Requests   Final    BOTTLES DRAWN AEROBIC AND ANAEROBIC Blood Culture adequate volume    Culture   Final    NO GROWTH 4 DAYS Performed at [facility], [address], [place], [place] [address]    Report Status PENDING  Incomplete    Time coordinating discharge: 45 minutes  Signed: Blakleigh Straw  [facility] 05/16/2024, 11:44 AM

## 2024-05-16 NOTE — Telephone Encounter (Signed)
 Spoke with son regarding Dr. Gib response. Son voiced understanding and stated he will accompany patient at the appointment next week.

## 2024-05-16 NOTE — Progress Notes (Signed)
 Physical Therapy Note  (Full treatment note to follow)  SATURATION QUALIFICATIONS: (This note is used to comply with regulatory documentation for home oxygen)  Patient Saturations on Room Air at Rest = 97%  Patient Saturations on Room Air while Ambulating = 90-100%  Patient does not require supplemental oxygen to maintain oxygen saturations at acceptable, safe levels with physical activity.  Silvano Currier, PT  Acute Rehabilitation Services Office [phone] Secure Chat welcomed

## 2024-05-16 NOTE — TOC Transition Note (Signed)
 Transition of Care [facility]) - Discharge Note   Patient Details  Name: Scott Vasquez MRN: [id number] Date of Birth: 1940-11-05  Transition of [facility]) CM/SW Contact:  Tom-Johnson, Cherie Lasalle Daphne, RN Phone Number: 05/16/2024, 12:01 PM   Clinical Narrative:     Patient is scheduled for discharge today.  Readmission Risk Assessment done. Home health info, hospital f/u and discharge instructions on AVS. Prescriptions sent to [facility] pharmacy and patient will receive meds prior discharge. Son, Vaughan to transport at discharge.  No further ICM needs noted.        Final next level of care: Home w Home Health Services Barriers to Discharge: Barriers Resolved   Patient Goals and CMS Choice Patient states their goals for this hospitalization and ongoing recovery are:: To return home CMS Medicare.gov Compare Post Acute Care list provided to:: Patient Choice offered to / list presented to : Patient, Adult Children (Son, Vaughan)      Discharge Placement                Patient to be transferred to facility by: Son Name of family member notified: Dwight    Discharge Plan and Services Additional resources added to the After Visit Summary for                  DME Arranged: N/A DME Agency: NA       HH Arranged: PT, RN, Disease Management, OT, Nurse's Aide HH Agency: [facility] Date [facility] Agency Contacted: 05/16/24 Time HH Agency Contacted: 1027 Representative spoke with at [facility] Agency: [place]  Social Drivers of Health (SDOH) Interventions SDOH Screenings   Food Insecurity: No Food Insecurity (05/12/2024)  Housing: Low Risk  (05/12/2024)  Transportation Needs: Patient Unable To Answer (05/12/2024)  Utilities: Not At Risk (05/12/2024)  Depression (PHQ2-9): Low Risk  (07/15/2023)  Financial Resource Strain: Low Risk  (05/09/2021)  Social Connections: Moderately Isolated (05/12/2024)  Tobacco Use: Medium Risk (05/11/2024)     Readmission Risk Interventions     05/12/2024   11:23 AM  Readmission Risk Prevention Plan  Post Dischage Appt Complete  Medication Screening Complete  Transportation Screening Complete

## 2024-05-17 ENCOUNTER — Telehealth: Payer: Self-pay

## 2024-05-17 LAB — CULTURE, BLOOD (ROUTINE X 2)
Culture: NO GROWTH
Culture: NO GROWTH
Special Requests: ADEQUATE

## 2024-05-17 NOTE — Transitions of Care (Post Inpatient/ED Visit) (Signed)
 05/17/2024  Name: Scott Vasquez MRN: [id number] DOB: [birth year]  Today's TOC FU Call Status: Today's TOC FU Call Status:: Successful TOC FU Call Completed TOC FU Call Complete Date: 05/17/24 Patient's Name and Date of Birth confirmed.  Transition Care Management Follow-up Telephone Call Date of Discharge: 05/16/24 Discharge Facility: [facility]) Type of Discharge: Inpatient Admission Primary Inpatient Discharge Diagnosis:: Acute hypoxemic respiratory failure How have you been since you were released from the hospital?: Better Any questions or concerns?: No  Items Reviewed: Did you receive and understand the discharge instructions provided?: Yes Medications obtained,verified, and reconciled?: Yes (Medications Reviewed) Any new allergies since your discharge?: No Dietary orders reviewed?: Yes Type of Diet Ordered:: diabetic Do you have support at home?: Yes People in Home [RPT]: spouse Name of Support/Comfort Primary Source: [name]  Medications Reviewed Today: Medications Reviewed Today     Reviewed by Rumalda Alan PENNER, RN (Registered Nurse) on 05/17/24 at 1028  Med List Status: <None>   Medication Order Taking? Sig Documenting Provider Last Dose Status Informant  amLODipine  (NORVASC ) 2.5 MG tablet [id number] Yes Take 1 tablet (2.5 mg total) by mouth daily. [place], Betty G, MD  Active Child, Pharmacy Records  amoxicillin-clavulanate (AUGMENTIN) 875-125 MG tablet [id number] Yes Take 1 tablet by mouth every 12 (twelve) hours for 3 days. Arlice Reichert, MD  Active   atorvastatin  (LIPITOR ) 80 MG tablet [id number] Yes Take 1 tablet (80 mg total) by mouth daily. [place], Betty G, MD  Active Child, Pharmacy Records  citalopram  (CELEXA ) 20 MG tablet [id number] Yes TAKE 1 TABLET EVERY DAY Wertman, Sara E, PA-C  Active Child, Pharmacy Records  clopidogrel  (PLAVIX ) 75 MG tablet [id number] Yes Take 1 tablet (75 mg total) by mouth daily. [place], Betty G, MD  Active Child, Pharmacy Records   donepezil  (ARICEPT ) 10 MG tablet [id number] Yes TAKE 1 TABLET AT BEDTIME Wertman, Sara E, PA-C  Active Child, Pharmacy Records  glipiZIDE  (GLUCOTROL  XL) 5 MG 24 hr tablet [id number] Yes Take 1 tablet (5 mg total) by mouth daily with breakfast. Arlice Reichert, MD  Active   levothyroxine  (SYNTHROID ) 50 MCG tablet [id number] Yes TAKE 1 TABLET EVERY DAY Nafziger, Darleene, NP  Active Child, Pharmacy Records  memantine  (NAMENDA ) 10 MG tablet [id number] Yes TAKE 1 TABLET TWICE DAILY Wertman, Sara E, PA-C  Active Child, Pharmacy Records           Today's Vitals   05/17/24 1033  BP: 125/75  PainSc: 0-No pain    Home Care and Equipment/Supplies: Were Home Health Services Ordered?: Yes Name of Home Health Agency:: [facility] Has Agency set up a time to come to your home?: No EMR reviewed for Home Health Orders:  (was previously active.) Any new equipment or medical supplies ordered?: No  Functional Questionnaire: Do you need assistance with bathing/showering or dressing?: Yes Do you need assistance with meal preparation?: Yes Do you need assistance with eating?: Yes Do you have difficulty maintaining continence: No Do you need assistance with getting out of bed/getting out of a chair/moving?: Yes Do you have difficulty managing or taking your medications?: Yes (wife)  Follow up appointments reviewed: PCP Follow-up appointment confirmed?: Yes Date of PCP follow-up appointment?: 05/26/24 Follow-up Provider: Dr. [place] Specialist Hospital Follow-up appointment confirmed?: No Reason Specialist Follow-Up Not Confirmed: Patient has Specialist Provider Number and will Call for Appointment Do you need transportation to your follow-up appointment?: No Do you understand care options if your condition(s) worsen?: Yes-patient verbalized understanding  SDOH Interventions Today  Flowsheet Row Most Recent Value  SDOH Interventions   Food Insecurity Interventions Intervention Not Indicated  Housing  Interventions Intervention Not Indicated  Transportation Interventions Intervention Not Indicated  Utilities Interventions Intervention Not Indicated  Phone call with wife due to dementia.  Wife, [name] on [place].   Goals Addressed             This Visit's Progress    [facility] Transitions of Care (TOC) Care Plan       Problems:  Recent Hospitalization for treatment of pneumonia No Specialist appointment - wife to call and schedule with urology  Goal:  Over the next 30 days, the patient will not experience hospital readmission  Interventions:  Transitions of Care: Doctor Visits  - discussed the importance of doctor visits Reviewed Signs and symptoms of infection Reviewed all medications. Wife has all medications and patient is taking as prescribed. Reviewed importance of taking Augmentin with food. Reviewed vital signs Discussed importance of follow up with urology. Reviewed ADLS and IADLS care with wife and daughter.  Reviewed breathing is improved.  Reviewed and offered 30 day TOC program and wife consented. Scheduled next call.  Encouraged hydration Reviewed 24 hour care provided by family due to dementia.  This note sent to PCP. Reviewed with wife that patient previously with home health and she will call them if agency does not call her first.    Patient Self Care Activities:  Attend all scheduled provider appointments Call pharmacy for medication refills 3-7 days in advance of running out of medications Call provider office for new concerns or questions  Notify RN Care Manager of [facility] call rescheduling needs Participate in Transition of Care Program/Attend TOC scheduled calls Take medications as prescribed    Plan:  Telephone follow up appointment with care management team member scheduled for:  05/24/2024 at 10 am The patient has been provided with contact information for the care management team and has been advised to call with any health related questions or concerns.           Alan Ee, RN, BSN, CEN [employer]- Transition of Care Team.  [facility] [phone]

## 2024-05-18 ENCOUNTER — Telehealth: Payer: Self-pay | Admitting: *Deleted

## 2024-05-18 DIAGNOSIS — E538 Deficiency of other specified B group vitamins: Secondary | ICD-10-CM | POA: Diagnosis not present

## 2024-05-18 DIAGNOSIS — E1122 Type 2 diabetes mellitus with diabetic chronic kidney disease: Secondary | ICD-10-CM | POA: Diagnosis not present

## 2024-05-18 DIAGNOSIS — J189 Pneumonia, unspecified organism: Secondary | ICD-10-CM | POA: Diagnosis not present

## 2024-05-18 DIAGNOSIS — I959 Hypotension, unspecified: Secondary | ICD-10-CM | POA: Diagnosis not present

## 2024-05-18 DIAGNOSIS — N1832 Chronic kidney disease, stage 3b: Secondary | ICD-10-CM | POA: Diagnosis not present

## 2024-05-18 DIAGNOSIS — J9601 Acute respiratory failure with hypoxia: Secondary | ICD-10-CM | POA: Diagnosis not present

## 2024-05-18 DIAGNOSIS — I129 Hypertensive chronic kidney disease with stage 1 through stage 4 chronic kidney disease, or unspecified chronic kidney disease: Secondary | ICD-10-CM | POA: Diagnosis not present

## 2024-05-18 DIAGNOSIS — N390 Urinary tract infection, site not specified: Secondary | ICD-10-CM | POA: Diagnosis not present

## 2024-05-18 DIAGNOSIS — G9341 Metabolic encephalopathy: Secondary | ICD-10-CM | POA: Diagnosis not present

## 2024-05-18 NOTE — Telephone Encounter (Signed)
 Verbal authorization to requested services can be given. Thanks, BJ

## 2024-05-18 NOTE — Telephone Encounter (Signed)
 This can be done during hospital follow up, appt needs to be moved if needed. Continue monitoring pulse ox at home. Thanks, BJ

## 2024-05-18 NOTE — Telephone Encounter (Signed)
 Copied from CRM [id number]. Topic: Clinical - Home Health Verbal Orders >> May 18, 2024  3:38 PM Tinnie BROCKS wrote: Caller/Agency: [place], RN with [facility] Number: [phone]  Service Requested: Skilled Nursing Frequency: 1 week 3, every other week 6 Any new concerns about the patient? No  Secure line so OK to leave full VM with verbal order and name.

## 2024-05-19 NOTE — Telephone Encounter (Signed)
 Spoke with patient's wife regarding Dr. Gib response. Wife voiced understanding.

## 2024-05-19 NOTE — Telephone Encounter (Signed)
 Left detailed message for representative with verbal authorization.

## 2024-05-22 ENCOUNTER — Telehealth: Payer: Self-pay

## 2024-05-22 DIAGNOSIS — E1122 Type 2 diabetes mellitus with diabetic chronic kidney disease: Secondary | ICD-10-CM | POA: Diagnosis not present

## 2024-05-22 DIAGNOSIS — I129 Hypertensive chronic kidney disease with stage 1 through stage 4 chronic kidney disease, or unspecified chronic kidney disease: Secondary | ICD-10-CM | POA: Diagnosis not present

## 2024-05-22 DIAGNOSIS — J9601 Acute respiratory failure with hypoxia: Secondary | ICD-10-CM | POA: Diagnosis not present

## 2024-05-22 DIAGNOSIS — N390 Urinary tract infection, site not specified: Secondary | ICD-10-CM | POA: Diagnosis not present

## 2024-05-22 DIAGNOSIS — N1832 Chronic kidney disease, stage 3b: Secondary | ICD-10-CM | POA: Diagnosis not present

## 2024-05-22 DIAGNOSIS — I959 Hypotension, unspecified: Secondary | ICD-10-CM | POA: Diagnosis not present

## 2024-05-22 DIAGNOSIS — J189 Pneumonia, unspecified organism: Secondary | ICD-10-CM | POA: Diagnosis not present

## 2024-05-22 DIAGNOSIS — G9341 Metabolic encephalopathy: Secondary | ICD-10-CM | POA: Diagnosis not present

## 2024-05-22 DIAGNOSIS — E538 Deficiency of other specified B group vitamins: Secondary | ICD-10-CM | POA: Diagnosis not present

## 2024-05-22 NOTE — Telephone Encounter (Signed)
 Please advise.   Copied from CRM [id number]. Topic: Clinical - Home Health Verbal Orders >> May 22, 2024  1:59 PM Aleatha BROCKS wrote: Caller/Agency: [facility] home [facility] Callback Number: [phone] Service Requested: Physical Therapy Frequency: once a week for 4 weeks Any new concerns about the patient? No

## 2024-05-23 ENCOUNTER — Telehealth: Payer: Self-pay

## 2024-05-23 DIAGNOSIS — E538 Deficiency of other specified B group vitamins: Secondary | ICD-10-CM | POA: Diagnosis not present

## 2024-05-23 DIAGNOSIS — I129 Hypertensive chronic kidney disease with stage 1 through stage 4 chronic kidney disease, or unspecified chronic kidney disease: Secondary | ICD-10-CM | POA: Diagnosis not present

## 2024-05-23 DIAGNOSIS — J9601 Acute respiratory failure with hypoxia: Secondary | ICD-10-CM | POA: Diagnosis not present

## 2024-05-23 DIAGNOSIS — N390 Urinary tract infection, site not specified: Secondary | ICD-10-CM | POA: Diagnosis not present

## 2024-05-23 DIAGNOSIS — E1122 Type 2 diabetes mellitus with diabetic chronic kidney disease: Secondary | ICD-10-CM | POA: Diagnosis not present

## 2024-05-23 DIAGNOSIS — J189 Pneumonia, unspecified organism: Secondary | ICD-10-CM | POA: Diagnosis not present

## 2024-05-23 DIAGNOSIS — G9341 Metabolic encephalopathy: Secondary | ICD-10-CM | POA: Diagnosis not present

## 2024-05-23 DIAGNOSIS — N1832 Chronic kidney disease, stage 3b: Secondary | ICD-10-CM | POA: Diagnosis not present

## 2024-05-23 DIAGNOSIS — I959 Hypotension, unspecified: Secondary | ICD-10-CM | POA: Diagnosis not present

## 2024-05-23 NOTE — Telephone Encounter (Signed)
 Copied from CRM [id number]. Topic: Clinical - Home Health Verbal Orders >> May 23, 2024  3:17 PM Thersia BROCKS wrote: Caller/Agency: [facility]  Callback Number: [phone] Service Requested: Occupational Therapy and Home Health Aid  Frequency: 1 week for 3 weeks and 2 week for 3 weeks  Any new concerns about the patient? No , but bp 102/54

## 2024-05-23 NOTE — Telephone Encounter (Signed)
It is okay to give verbal authorization to requested services. Thanks, BJ 

## 2024-05-24 ENCOUNTER — Other Ambulatory Visit: Payer: Self-pay

## 2024-05-24 NOTE — Telephone Encounter (Signed)
 Representative Mallory notified of verbal authorizations. Mallory voiced understanding.

## 2024-05-24 NOTE — Patient Instructions (Signed)
 Visit Information  Thank you for taking time to visit with me today. Please don't hesitate to contact me if I can be of assistance to you before our next scheduled telephone appointment.  Our next appointment is by telephone on 05/31/2024 at 1045  Following is a copy of your care plan:   Goals Addressed             This Visit's Progress    [facility] Transitions of Care (TOC) Care Plan       Problems:  Recent Hospitalization for treatment of pneumonia 05/24/2024  Patient is doing better per wife.  Finished antibiotics. Reports no cough and no shortness of breath No Specialist appointment - wife to call and schedule with urology  05/24/2024  PCP follow up this week. Unknown if specialist appointment. Wife to call today.   Goal:  Over the next 30 days, the patient will not experience hospital readmission  Interventions:  Transitions of Care: Doctor Visits  - discussed the importance of doctor visits Reviewed Signs and symptoms of infection Reviewed all medications. Wife has all medications and patient is taking as prescribed.  Reviewed vital signs Discussed importance of follow up with urology. Wife to call today and see if patient has an appointment. Provided wife with phone number.  Reviewed ADLS and IADLS care with wife and daughter- no changes Reviewed breathing is improved.  Scheduled next call.  Encouraged hydration Reviewed 24 hour care provided by family due to dementia.  Confirmed active with home health   Patient Self Care Activities:  Attend all scheduled provider appointments Call pharmacy for medication refills 3-7 days in advance of running out of medications Call provider office for new concerns or questions  Notify RN Care Manager of TOC call rescheduling needs Participate in Transition of Care Program/Attend TOC scheduled calls Take medications as prescribed    Plan:  Telephone follow up appointment with care management team member scheduled for:  05/31/2024 at  1045 am The patient has been provided with contact information for the care management team and has been advised to call with any health related questions or concerns.         Patient verbalizes understanding of instructions and care plan provided today and agrees to view in MyChart. Active MyChart status and patient understanding of how to access instructions and care plan via MyChart confirmed with patient.     Telephone follow up appointment with care management team member scheduled for: 05/31/2024 at 1045  Please call the care guide team at [phone] if you need to cancel or reschedule your appointment.   Please call the Suicide and Crisis Lifeline: 988 call the [place]  National Suicide Prevention Lifeline: 416 675 2444 or TTY: 501 010 4677 TTY 825-087-4992) to talk to a trained counselor call 1-800-273-TALK (toll free, 24 hour hotline) call 911 if you are experiencing a Mental Health or Behavioral Health Crisis or need someone to talk to.  Alan Ee, RN, BSN, CEN [employer]- Transition of Care Team.  [facility] [phone]

## 2024-05-24 NOTE — Transitions of Care (Post Inpatient/ED Visit) (Signed)
 Transition of Care week 2  Visit Note  05/24/2024  Name: Scott Vasquez MRN: [id number]          DOB: 14-Sep-1940  Situation: Patient enrolled in [facility] 30-day program. Visit completed with wife of patient due to dementia by telephone.   Background:   Initial Transition Care Management Follow-up Telephone Call Discharge Date and Diagnosis: 05/16/24, Acute hypoxemic respiratory failure   Past Medical History:  Diagnosis Date   Anemia    iron deficiency   Diabetes mellitus    type II   Diverticulosis of colon    Hypertension    Stroke (HCC)    numbness in left hand   Thyroid  nodule     Assessment: Wife reports that patient is doing well. Denies any new problems or concerns. Wife states that patient is breathing well.  Patient Reported Symptoms: Cognitive Cognitive Status: Unable to Assess      Neurological Neurological Review of Symptoms: Not assessed    HEENT HEENT Symptoms Reported: Not assessed      Cardiovascular Cardiovascular Symptoms Reported: No symptoms reported Does patient have uncontrolled Hypertension?: No Cardiovascular Management Strategies: Medication therapy  Respiratory Respiratory Symptoms Reported: Other: Other Respiratory Symptoms: wife denies cough or shortness of breath. Respiratory Management Strategies: Routine screening Respiratory Self-Management Outcome: 5 (very good)  Endocrine Endocrine Symptoms Reported: No symptoms reported Is patient diabetic?: Yes Is patient checking blood sugars at home?: Yes List most recent blood sugar readings, include date and time of day: no reading again today. Endocrine Self-Management Outcome: 5 (very good)  Gastrointestinal Gastrointestinal Symptoms Reported: No symptoms reported      Genitourinary Genitourinary Symptoms Reported: No symptoms reported Additional Genitourinary Details: wife denies any urinary concerns.    Integumentary Integumentary Symptoms Reported: No symptoms reported     Musculoskeletal Musculoskelatal Symptoms Reviewed: Unsteady gait        Psychosocial Psychosocial Symptoms Reported: Not assessed         There were no vitals filed for this visit.  Medications Reviewed Today     Reviewed by Rumalda Alan PENNER, RN (Registered Nurse) on 05/24/24 at 1037  Med List Status: <None>   Medication Order Taking? Sig Documenting Provider Last Dose Status Informant  amLODipine  (NORVASC ) 2.5 MG tablet [id number] Yes Take 1 tablet (2.5 mg total) by mouth daily. [place], Betty G, MD  Active Child, Pharmacy Records  atorvastatin  (LIPITOR ) 80 MG tablet [id number] Yes Take 1 tablet (80 mg total) by mouth daily. [place], Betty G, MD  Active Child, Pharmacy Records  citalopram  (CELEXA ) 20 MG tablet [id number] Yes TAKE 1 TABLET EVERY DAY Wertman, Sara E, PA-C  Active Child, Pharmacy Records  clopidogrel  (PLAVIX ) 75 MG tablet [id number] Yes Take 1 tablet (75 mg total) by mouth daily. [place], Betty G, MD  Active Child, Pharmacy Records  donepezil  (ARICEPT ) 10 MG tablet [id number] Yes TAKE 1 TABLET AT BEDTIME Wertman, Sara E, PA-C  Active Child, Pharmacy Records  glipiZIDE  (GLUCOTROL  XL) 5 MG 24 hr tablet [id number] Yes Take 1 tablet (5 mg total) by mouth daily with breakfast. Arlice Reichert, MD  Active   levothyroxine  (SYNTHROID ) 50 MCG tablet [id number] Yes TAKE 1 TABLET EVERY DAY Nafziger, Darleene, NP  Active Child, Pharmacy Records  memantine  (NAMENDA ) 10 MG tablet [id number] Yes TAKE 1 TABLET TWICE DAILY Wertman, Sara E, PA-C  Active Child, Pharmacy Records            Goals      Manage My Medicine     Timeframe:  [place]  Goal Priority:  High Start Date:                             Expected End Date:                       Follow Up Date 06/08/21    - keep a list of all the medicines I take; vitamins and herbals too - learn to read medicine labels - use a pillbox to sort medicine    Why is this important?   These steps will help you keep on track with your medicines.    Notes:      patient     Keep working and stay active     [facility] Transitions of Care [facility]) Care Plan     Problems:  Recent Hospitalization for treatment of pneumonia 05/24/2024  Patient is doing better per wife.  Finished antibiotics. Reports no cough and no shortness of breath No Specialist appointment - wife to call and schedule with urology  05/24/2024  PCP follow up this week. Unknown if specialist appointment. Wife to call today.   Goal:  Over the next 30 days, the patient will not experience hospital readmission  Interventions:  Transitions of Care: Doctor Visits  - discussed the importance of doctor visits Reviewed Signs and symptoms of infection Reviewed all medications. Wife has all medications and patient is taking as prescribed.  Reviewed vital signs Discussed importance of follow up with urology. Wife to call today and see if patient has an appointment. Provided wife with phone number.  Reviewed ADLS and IADLS care with wife and daughter- no changes Reviewed breathing is improved.  Scheduled next call.  Encouraged hydration Reviewed 24 hour care provided by family due to dementia.  Confirmed active with home health   Patient Self Care Activities:  Attend all scheduled provider appointments Call pharmacy for medication refills 3-7 days in advance of running out of medications Call provider office for new concerns or questions  Notify RN Care Manager of TOC call rescheduling needs Participate in Transition of Care Program/Attend TOC scheduled calls Take medications as prescribed    Plan:  Telephone follow up appointment with care management team member scheduled for:  05/31/2024 at 1045 am The patient has been provided with contact information for the care management team and has been advised to call with any health related questions or concerns.          Recommendation:   Continue Current Plan of Care  Follow Up Plan:   Telephone follow up appointment  date/time:  05/31/2024 at 1045 am  Alan Ee, RN, BSN, [employer]- Transition of Care Team.  [facility] [employer] [phone]

## 2024-05-25 ENCOUNTER — Ambulatory Visit: Admitting: Adult Health

## 2024-05-26 ENCOUNTER — Other Ambulatory Visit (HOSPITAL_BASED_OUTPATIENT_CLINIC_OR_DEPARTMENT_OTHER): Payer: Self-pay

## 2024-05-26 ENCOUNTER — Telehealth: Payer: Self-pay | Admitting: *Deleted

## 2024-05-26 ENCOUNTER — Ambulatory Visit: Admitting: Family Medicine

## 2024-05-26 ENCOUNTER — Encounter: Payer: Self-pay | Admitting: Family Medicine

## 2024-05-26 VITALS — BP 100/52 | HR 71 | Temp 97.7°F | Resp 16 | Ht 69.0 in | Wt 153.6 lb

## 2024-05-26 DIAGNOSIS — E1159 Type 2 diabetes mellitus with other circulatory complications: Secondary | ICD-10-CM

## 2024-05-26 DIAGNOSIS — J189 Pneumonia, unspecified organism: Secondary | ICD-10-CM

## 2024-05-26 DIAGNOSIS — Z7984 Long term (current) use of oral hypoglycemic drugs: Secondary | ICD-10-CM

## 2024-05-26 DIAGNOSIS — N1832 Chronic kidney disease, stage 3b: Secondary | ICD-10-CM | POA: Diagnosis not present

## 2024-05-26 DIAGNOSIS — I10 Essential (primary) hypertension: Secondary | ICD-10-CM | POA: Diagnosis not present

## 2024-05-26 NOTE — Assessment & Plan Note (Signed)
 Last hemoglobin A1c 5.4 on 05/12/2024. Glipizide  dose was recently decreased from 10 mg to 5 mg daily. No changes today. Monitor glucose before breakfast and if persistently under 100, we could try to stop glipizide . Continue periodic eye exam and good footcare. Follow-up in 3 months.

## 2024-05-26 NOTE — Assessment & Plan Note (Signed)
 Has had some mildly low BPs at home, today SBP on lower normal range. Recommend stopping amlodipine  2.5 mg daily. Continue nonpharmacologic treatment. Monitor BP at home regularly. Follow-up in 3 months, before if needed.

## 2024-05-26 NOTE — Patient Instructions (Addendum)
 A few things to remember from today's visit:  Essential hypertension  Community acquired pneumonia of left lower lobe of lung  Type 2 diabetes mellitus with other circulatory complication, without long-term current use of insulin  (HCC) Stevia instead sugar. Stop Amlodipine  2.5 mg. Rest uncharged.  Monitor oxygen, warm finger and cut fingernails.  Monitor blood pressures.  If you need refills for medications you take chronically, please call your pharmacy. Do not use My Chart to request refills or for acute issues that need immediate attention. If you send a my chart message, it may take a few days to be addressed, specially if I am not in the office.  Please be sure medication list is accurate. If a new problem present, please set up appointment sooner than planned today.

## 2024-05-26 NOTE — Progress Notes (Unsigned)
 Chief Complaint  Patient presents with   Medical Management of Chronic Issues    Pt is with spouse. She reports she is here for hospital f/u and her concern about pt Bp low- 102/47, 63/57, 97/45. Pt has PT next week.    Hospitalization Follow-up   Scott Vasquez is a 83 y.o. male *** Discussed the use of AI scribe software for clinical note transcription with the patient, who gave verbal consent to proceed.  History of Present Illness Scott Vasquez is an 83 year old male with past medical history significant for DM 2, hypertension, hyperlipidemia, CVA, and dementia who is here today to follow on recent hospitalization. Here with wife and son, who provide hx.  Hospitalized from 05/11/2024 to 05/16/2024. TOC call on 05/17/2024. He presented to the ED on the date of admission with concern from family about altered mental status, lethargy, and low BP.  He was also noted to be hypoxic at 79% at room air.  Physical therapy recommended discharge to a SNF facility but patient's son wanted to take a home. H&H services were arranged. He was recently hospitalized due to confusion, hypotension, and hypoxemia. A chest x-ray indicated possible pneumonia, and he was treated with IV and oral antibiotics. Currently, he has no cough or wheezing, but his oxygen levels remain low, and his blood pressure is on the lower side, with recent home readings showing significant drops, such as 63/37 mmHg.  He has a history of diabetes, with a recent hemoglobin A1c of 5.9 during his hospital stay. He is taking glipizide  5 mg daily, which was reduced from 10 mg during his hospitalization. His blood sugar readings at home have been around 160-179 mg/dL. He has not been on Ozempic for a long time. His caregiver notes that he is losing weight and becoming weaker.  He is also taking Celexa  and Plavix . His caregiver mentions that he is weak and fatigued. His appetite is reportedly good, but there is a need to  monitor his sugar intake and ensure adequate protein consumption. He is scheduled to start physical and occupational therapy next week to aid in his recovery.   Left basilar pneumonia with acute respiratory failure and hypoxia. Noted hypoxia and leukocytosis during initial evaluation. Chest x-ray with possible left base pneumonia. He was started on empiric antibiotic treatment with IV Rocephin and azithromycin. Negative blood cultures x 5d, final 05/12/24. SABRA He was changed to oral Augmentin 875 mg -125 mg twice daily for 3 days. Did require O2 supplementation at the time of discharge. Lab Results  Component Value Date   WBC 9.5 05/14/2024   HGB 9.8 (L) 05/14/2024   HCT 30.9 (L) 05/14/2024   MCV 90.1 05/14/2024   PLT 247 05/14/2024   Hypertension: He is currently on amlodipine  2.5 mg daily. CVA: He is on Plavix  75 mg daily.  Lab Results  Component Value Date   NA 137 05/13/2024   CL 105 05/13/2024   K 3.9 05/13/2024   CO2 26 05/13/2024   BUN 18 05/13/2024   CREATININE 1.18 05/13/2024   GFRNONAA >60 05/13/2024   CALCIUM  8.3 (L) 05/13/2024   PHOS 2.3 (L) 05/13/2024   ALBUMIN 2.7 (L) 05/13/2024   GLUCOSE 198 (H) 05/13/2024   DM2: Currently he is on glipizide  5 mg daily. Lab Results  Component Value Date   HGBA1C 5.4 05/12/2024   Currently he is on Celexa  20 mg daily. Moderate dementia with aphasia: Currently on Namenda  10 mg twice daily and Aricept  10 mg  daily.  He follows with neurology regularly, last visit on 12/21/2023. *** Review of Systems See other pertinent positives and negatives in HPI.  Current Outpatient Medications on File Prior to Visit  Medication Sig Dispense Refill   atorvastatin  (LIPITOR ) 80 MG tablet Take 1 tablet (80 mg total) by mouth daily. 90 tablet 3   citalopram  (CELEXA ) 20 MG tablet TAKE 1 TABLET EVERY DAY 90 tablet 3   clopidogrel  (PLAVIX ) 75 MG tablet Take 1 tablet (75 mg total) by mouth daily. 90 tablet 2   donepezil  (ARICEPT ) 10 MG tablet  TAKE 1 TABLET AT BEDTIME 90 tablet 3   glipiZIDE  (GLUCOTROL  XL) 5 MG 24 hr tablet Take 1 tablet (5 mg total) by mouth daily with breakfast.     levothyroxine  (SYNTHROID ) 50 MCG tablet TAKE 1 TABLET EVERY DAY 90 tablet 3   memantine  (NAMENDA ) 10 MG tablet TAKE 1 TABLET TWICE DAILY 180 tablet 3   No current facility-administered medications on file prior to visit.    Past Medical History:  Diagnosis Date   Anemia    iron deficiency   Diabetes mellitus    type II   Diverticulosis of colon    Hypertension    Stroke (HCC)    numbness in left hand   Thyroid  nodule    Allergies  Allergen Reactions   Actos  [Pioglitazone ] Other (See Comments)    Diarrhea    Metformin  And Related Other (See Comments)    Diarrhea      Social History   Socioeconomic History   Marital status: Married    Spouse name: Not on file   Number of children: 2   Years of education: 8   Highest education level: Not on file  Occupational History   Occupation: retired    Associate Professor: [employer]  Tobacco Use   Smoking status: Former    Current packs/day: 0.00    Average packs/day: 0.5 packs/day for 20.0 years (10.0 ttl pk-yrs)    Types: Cigarettes    Start date: 62    Quit date: 08/10/1958    Years since quitting: 65.8   Smokeless tobacco: Never  Vaping Use   Vaping status: Never Used  Substance and Sexual Activity   Alcohol  use: No   Drug use: No   Sexual activity: Not on file  Other Topics Concern   Not on file  Social History Narrative   Pt is R handed   Lives in single story home with his wife, Scott Vasquez   2 adult children   8th grade education   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[employer]   Married 56 years   Social Drivers of [employer]: Low Risk  (05/09/2021)   Overall Financial Resource Strain (CARDIA)    Difficulty of Paying Living Expenses: Not hard at all  Food Insecurity: No Food Insecurity (05/17/2024)   Hunger Vital Sign    Worried About Running Out of Food in the Last  Year: Never true    Ran Out of Food in the Last Year: Never true  Transportation Needs: No Transportation Needs (05/17/2024)   PRAPARE - Administrator, Civil Service (Medical): No    Lack of Transportation (Non-Medical): No  Physical Activity: Not on file  Stress: Not on file  Social Connections: Moderately Isolated (05/12/2024)   Social Connection and Isolation Panel    Frequency of Communication with Friends and Family: More than three times a week    Frequency of Social Gatherings with Friends and  Family: More than three times a week    Attends Religious Services: Never    Active Member of Clubs or Organizations: No    Attends Banker Meetings: Never    Marital Status: Married   Vitals:   05/26/24 1450  BP: (!) 100/52  Pulse: 71  Resp: 16  Temp: 97.7 F (36.5 C)  SpO2: 92%   Body mass index is 22.68 kg/m.  Physical Exam Vitals and nursing note reviewed.  Constitutional:      General: He is not in acute distress.    Appearance: He is well-developed.  HENT:     Head: Normocephalic and atraumatic.     Mouth/Throat:     Mouth: Mucous membranes are moist.  Eyes:     Conjunctiva/sclera: Conjunctivae normal.  Cardiovascular:     Rate and Rhythm: Normal rate and regular rhythm.     Heart sounds: No murmur heard.    Comments: PT pulses palpable.  Pulmonary:     Effort: Pulmonary effort is normal. No respiratory distress.     Breath sounds: Normal breath sounds.  Abdominal:     Palpations: Abdomen is soft. There is no mass.     Tenderness: There is no abdominal tenderness.  Skin:    General: Skin is warm.     Findings: No erythema or rash.  Neurological:     Mental Status: He is alert. Mental status is at baseline.     Comments: Unstable gait assisted with a walker.  Psychiatric:        Mood and Affect: Mood and affect normal.        Speech: He is noncommunicative.   ASSESSMENT AND PLAN:  Scott Vasquez was seen today for medical  management of chronic issues and hospitalization follow-up.  Diagnoses and all orders for this visit:  Orders Placed This Encounter  Procedures   Basic metabolic panel with GFR   CBC   Type 2 diabetes mellitus with other circulatory complication, without long-term current use of insulin  Scott Vasquez) Assessment & Plan: Last hemoglobin A1c 5.4 on 05/12/2024. Glipizide  dose was recently decreased from 10 mg to 5 mg daily. No changes today. Monitor glucose before breakfast and if persistently under 100, we could try to stop glipizide . Continue periodic eye exam and good footcare. Follow-up in 3 months.   Essential hypertension Assessment & Plan: Has had some mildly low BPs at home, today SBP on lower normal range. Recommend stopping amlodipine  2.5 mg daily. Continue nonpharmacologic treatment. Monitor BP at home regularly. Follow-up in 3 months, before if needed.  Orders: -     Basic metabolic panel with GFR; Future -     CBC; Future  Community acquired pneumonia of left lower lobe of lung Assessment & Plan: Symptoms have resolved, lung auscultation today negative. Family would like a prescription for a pulse oximeter, given. Pulse X2 today 92%, hard to obtain due to long fingernails and cold fingetr tips. At the time of hospital discharge O2 sats at RA where appropriate, oxygen supplementation was not deemed necessary.       Return in about 3 months (around 08/26/2024) for chronic problems.  Scott Breshears G. [place], MD  [facility]. [place] office.

## 2024-05-26 NOTE — Telephone Encounter (Unsigned)
 Copied from CRM [id number]. Topic: Clinical - Medication Question >> May 25, 2024  5:18 PM Ashley R wrote: Reason for CRM: questions about atorvastatin  (LIPITOR ) 80 MG tablet. Would like callback [phone]

## 2024-05-26 NOTE — Assessment & Plan Note (Addendum)
 Symptoms have resolved, lung auscultation today negative. Family would like a prescription for a pulse oximeter, given. Pulse X2 today 92%, hard to obtain due to long fingernails and cold fingetr tips. At the time of hospital discharge O2 sats at RA where appropriate, oxygen supplementation was not deemed necessary.

## 2024-05-27 ENCOUNTER — Ambulatory Visit: Payer: Self-pay | Admitting: Family Medicine

## 2024-05-27 LAB — CBC
Hematocrit: 35.1 % — ABNORMAL LOW (ref 37.5–51.0)
Hemoglobin: 11.1 g/dL — ABNORMAL LOW (ref 13.0–17.7)
MCH: 28.7 pg (ref 26.6–33.0)
MCHC: 31.6 g/dL (ref 31.5–35.7)
MCV: 91 fL (ref 79–97)
Platelets: 366 x10E3/uL (ref 150–450)
RBC: 3.87 x10E6/uL — ABNORMAL LOW (ref 4.14–5.80)
RDW: 12.7 % (ref 11.6–15.4)
WBC: 9.2 x10E3/uL (ref 3.4–10.8)

## 2024-05-27 LAB — BASIC METABOLIC PANEL WITH GFR
BUN/Creatinine Ratio: 14 (ref 10–24)
BUN: 20 mg/dL (ref 8–27)
CO2: 25 mmol/L (ref 20–29)
Calcium: 9.5 mg/dL (ref 8.6–10.2)
Chloride: 99 mmol/L (ref 96–106)
Creatinine, Ser: 1.44 mg/dL — ABNORMAL HIGH (ref 0.76–1.27)
Glucose: 76 mg/dL (ref 70–99)
Potassium: 4.4 mmol/L (ref 3.5–5.2)
Sodium: 138 mmol/L (ref 134–144)
eGFR: 48 mL/min/1.73 — ABNORMAL LOW (ref >59)

## 2024-05-27 NOTE — Assessment & Plan Note (Signed)
 Continue adequate hydration, low salt diet and avoidance of NSAID's. At the time of hospital discharge e GFR > 60 and Cr 1.18.

## 2024-05-31 ENCOUNTER — Telehealth: Payer: Self-pay

## 2024-06-01 DIAGNOSIS — J9601 Acute respiratory failure with hypoxia: Secondary | ICD-10-CM | POA: Diagnosis not present

## 2024-06-01 DIAGNOSIS — E1122 Type 2 diabetes mellitus with diabetic chronic kidney disease: Secondary | ICD-10-CM | POA: Diagnosis not present

## 2024-06-01 DIAGNOSIS — N1832 Chronic kidney disease, stage 3b: Secondary | ICD-10-CM | POA: Diagnosis not present

## 2024-06-01 DIAGNOSIS — G9341 Metabolic encephalopathy: Secondary | ICD-10-CM | POA: Diagnosis not present

## 2024-06-01 DIAGNOSIS — N390 Urinary tract infection, site not specified: Secondary | ICD-10-CM | POA: Diagnosis not present

## 2024-06-01 DIAGNOSIS — E538 Deficiency of other specified B group vitamins: Secondary | ICD-10-CM | POA: Diagnosis not present

## 2024-06-01 DIAGNOSIS — I959 Hypotension, unspecified: Secondary | ICD-10-CM | POA: Diagnosis not present

## 2024-06-01 DIAGNOSIS — J189 Pneumonia, unspecified organism: Secondary | ICD-10-CM | POA: Diagnosis not present

## 2024-06-01 DIAGNOSIS — I129 Hypertensive chronic kidney disease with stage 1 through stage 4 chronic kidney disease, or unspecified chronic kidney disease: Secondary | ICD-10-CM | POA: Diagnosis not present

## 2024-06-06 ENCOUNTER — Telehealth: Payer: Self-pay

## 2024-06-06 NOTE — Telephone Encounter (Signed)
 Copied from CRM [id number]. Topic: Clinical - Home Health Verbal Orders >> Jun 06, 2024  3:38 PM Laymon HERO wrote: Caller/Agency: [facility] - Harlene Rushing Number: [phone] Service Requested: Physical Therapy Frequency: 1x 4 Any new concerns about the patient? No

## 2024-06-07 NOTE — Telephone Encounter (Signed)
 Spoke with Harlene, representative from agency. Verbal authorization given for requested services.

## 2024-06-07 NOTE — Telephone Encounter (Signed)
It is okay to give verbal authorization to requested services. Thanks, BJ 

## 2024-06-08 ENCOUNTER — Telehealth: Payer: Self-pay | Admitting: *Deleted

## 2024-06-08 ENCOUNTER — Ambulatory Visit: Payer: Self-pay

## 2024-06-08 NOTE — Telephone Encounter (Signed)
 Copied from CRM [id number]. Topic: Clinical - Medication Question >> Jun 08, 2024 11:49 AM Carlyon D wrote: Reason for CRM: Pt wife miss [name] is calling stating therapist mark bunn  just left and smelled an oder of UTI on pt and pt is acting strange like when elderly have and advised wife to call pcp to get antibiotic. Please reach out to pt wife carolyn her call back # [phone]

## 2024-06-08 NOTE — Telephone Encounter (Signed)
 FYI Only or Action Required?: FYI only for provider: appointment scheduled on 06/09/24.  Patient was last seen in primary care on 05/26/2024 by Jordan, Betty G, MD.  Called Nurse Triage reporting Dysuria.  Symptoms began Recent.  Interventions attempted: Nothing.  Symptoms are: gradually worsening.  Triage Disposition: See Physician Within 24 Hours  Patient/caregiver understands and will follow disposition?: Yes  Copied from CRM #[id number]. Topic: Clinical - Red Word Triage >> Jun 08, 2024  1:31 PM Lauren C wrote: Red Word that prompted transfer to Nurse Triage: Wife Scott Vasquez calling to ask if antibiotic has been called in yet, requesting that it be sent to [facility]. I let her know a message was sent a couple hours ago to RN. She says that he has not been triaged yet by a nurse but he is having UTI symptoms and burning pain when urinating. Reason for Disposition  Bad or foul-smelling urine  Answer Assessment - Initial Assessment Questions Speaking with wife Scott Vasquez (on [place]). Scheduled for appt tomorrow at [facility] [place]. Advised UC for worsening symptoms.  1. SYMPTOM: What's the main symptom you're concerned about? (e.g., frequency, incontinence)     Increased odor with urine  2. ONSET: When did the  Increased odor with urine  start?     Recent  3. PAIN: Is there any pain? If Yes, ask: How bad is it? (Scale: 1-10; mild, moderate, severe)     No  4. CAUSE: What do you think is causing the symptoms?     UTI  5. OTHER SYMPTOMS: Do you have any other symptoms? (e.g., blood in urine, fever, flank pain, pain with urination)     Cloudy urine. Denies fever or flank pain or blood in urine.  Protocols used: Urinary Symptoms-A-AH

## 2024-06-08 NOTE — Telephone Encounter (Signed)
 Requested services can be authorized. Thanks, BJ

## 2024-06-08 NOTE — Telephone Encounter (Signed)
 Attempted to call spouse. Received a message that stated, please enter your access code. Line disconnected.

## 2024-06-08 NOTE — Telephone Encounter (Signed)
 Copied from CRM #[id number]. Topic: Clinical - Home Health Verbal Orders >> Jun 08, 2024  9:43 AM Anairis L wrote: Caller[facility]Agency[facility] Callback Number: [phone] Service Requested: Occupational Therapy Frequency: 1 additional visitt 06/18/2024 Any new concerns about the patient? No

## 2024-06-09 ENCOUNTER — Telehealth: Payer: Self-pay | Admitting: *Deleted

## 2024-06-09 ENCOUNTER — Ambulatory Visit (INDEPENDENT_AMBULATORY_CARE_PROVIDER_SITE_OTHER): Admitting: Family Medicine

## 2024-06-09 ENCOUNTER — Ambulatory Visit: Admitting: Family Medicine

## 2024-06-09 ENCOUNTER — Encounter: Payer: Self-pay | Admitting: Family Medicine

## 2024-06-09 VITALS — BP 110/60 | Temp 98.0°F | Wt 151.2 lb

## 2024-06-09 DIAGNOSIS — N390 Urinary tract infection, site not specified: Secondary | ICD-10-CM

## 2024-06-09 LAB — POCT URINALYSIS DIPSTICK
Blood, UA: NEGATIVE
Glucose, UA: NEGATIVE
Ketones, UA: NEGATIVE
Leukocytes, UA: NEGATIVE
Nitrite, UA: NEGATIVE
Protein, UA: POSITIVE — AB
Spec Grav, UA: 1.025 (ref 1.010–1.025)
Urobilinogen, UA: 0.2 U/dL — AB
pH, UA: 6 (ref 5.0–8.0)

## 2024-06-09 MED ORDER — CIPROFLOXACIN HCL 500 MG PO TABS: 500.0000 mg | ORAL_TABLET | Freq: Two times a day (BID) | ORAL | 0 refills | Status: AC

## 2024-06-09 NOTE — Telephone Encounter (Signed)
 Has appt today with Dr. Johnny.

## 2024-06-09 NOTE — Addendum Note (Signed)
 Addended by: LADONNA INOCENTE SAILOR on: 06/09/2024 03:23 PM   Modules accepted: Orders

## 2024-06-09 NOTE — Telephone Encounter (Signed)
 Copied from CRM #[id number]. Topic: Clinical - Home Health Verbal Orders >> Jun 09, 2024  8:30 AM Harlene ORN wrote: Caller/Agency: [facility] Callback Number: [phone] (secure vm) Service Requested: Occupational Therapy Frequency: extend once a week for two weeks Any new concerns about the patient? Yes - believe that he has a UTI. Wants the PCP to write a prescription. Nurse is on vacation by the end of today. Needs the orders by today.

## 2024-06-09 NOTE — Telephone Encounter (Signed)
 Melerie returned call. Gave verbal authorization for requested services per Dr. Gib order.

## 2024-06-09 NOTE — Telephone Encounter (Signed)
 Attempted to reach Mel[facility] agent. Left voicemail for her to return my call.

## 2024-06-09 NOTE — Progress Notes (Signed)
   Subjective:    Patient ID: Scott Vasquez, male    DOB: 09/17/1940, 83 y.o.   MRN: [id number]  HPI Here with his wife after they noticed that his urine has had a foul odor for several days. No fever, no burning or pain. No cough or SOB. He was hospitalized from 05-11-24 to 10-7 25 after he presented with altered mental status. He was found to have a pneumonia and a UTI. All urine and blood cultures were negative. He was treated with IV antibiotics, and his mental status quickly returned to his baseline. He was sent home with several days of Augmentin. His appetite is good, and he drinks plenty of water every day.    Review of Systems  Constitutional: Negative.   HENT: Negative.    Eyes: Negative.   Respiratory: Negative.    Cardiovascular: Negative.   Gastrointestinal: Negative.   Genitourinary:  Negative for dysuria, flank pain, frequency, hematuria and urgency.       Objective:   Physical Exam Constitutional:      General: He is not in acute distress.    Comments: Walks with a rolling walker. He follows our conversation but his wife does most of the talking   Cardiovascular:     Rate and Rhythm: Normal rate and regular rhythm.     Pulses: Normal pulses.     Heart sounds: Normal heart sounds.  Pulmonary:     Effort: Pulmonary effort is normal.     Breath sounds: Normal breath sounds.  Neurological:     Mental Status: He is alert.           Assessment & Plan:  He most likely has a partially treated UTI related to the hospital stay. We will give him 10 days of Cipro 500 mg BID. Culture the sample. Recheck as needed.  Garnette Olmsted, MD

## 2024-06-10 LAB — URINE CULTURE
MICRO NUMBER:: 17175629
Result:: NO GROWTH
SPECIMEN QUALITY:: ADEQUATE

## 2024-06-12 ENCOUNTER — Ambulatory Visit: Payer: Self-pay | Admitting: Family Medicine

## 2024-06-12 DIAGNOSIS — N1832 Chronic kidney disease, stage 3b: Secondary | ICD-10-CM | POA: Diagnosis not present

## 2024-06-12 DIAGNOSIS — E538 Deficiency of other specified B group vitamins: Secondary | ICD-10-CM | POA: Diagnosis not present

## 2024-06-12 DIAGNOSIS — I959 Hypotension, unspecified: Secondary | ICD-10-CM | POA: Diagnosis not present

## 2024-06-12 DIAGNOSIS — N390 Urinary tract infection, site not specified: Secondary | ICD-10-CM | POA: Diagnosis not present

## 2024-06-12 DIAGNOSIS — E1122 Type 2 diabetes mellitus with diabetic chronic kidney disease: Secondary | ICD-10-CM | POA: Diagnosis not present

## 2024-06-12 DIAGNOSIS — J9601 Acute respiratory failure with hypoxia: Secondary | ICD-10-CM | POA: Diagnosis not present

## 2024-06-12 DIAGNOSIS — G9341 Metabolic encephalopathy: Secondary | ICD-10-CM | POA: Diagnosis not present

## 2024-06-12 DIAGNOSIS — J189 Pneumonia, unspecified organism: Secondary | ICD-10-CM | POA: Diagnosis not present

## 2024-06-12 DIAGNOSIS — I129 Hypertensive chronic kidney disease with stage 1 through stage 4 chronic kidney disease, or unspecified chronic kidney disease: Secondary | ICD-10-CM | POA: Diagnosis not present

## 2024-06-13 NOTE — Telephone Encounter (Signed)
 Requested services can be authorized. If still having urinary symptoms, UA with urine culture reflex can be ordered. Thanks, BJ

## 2024-06-14 NOTE — Telephone Encounter (Signed)
 Attempted to reach [facility]. Left detailed message on secure VM with authorized services per Dr. Jordan.

## 2024-06-17 DIAGNOSIS — J189 Pneumonia, unspecified organism: Secondary | ICD-10-CM | POA: Diagnosis not present

## 2024-06-17 DIAGNOSIS — I959 Hypotension, unspecified: Secondary | ICD-10-CM | POA: Diagnosis not present

## 2024-06-17 DIAGNOSIS — E538 Deficiency of other specified B group vitamins: Secondary | ICD-10-CM | POA: Diagnosis not present

## 2024-06-17 DIAGNOSIS — G9341 Metabolic encephalopathy: Secondary | ICD-10-CM | POA: Diagnosis not present

## 2024-06-17 DIAGNOSIS — N1832 Chronic kidney disease, stage 3b: Secondary | ICD-10-CM | POA: Diagnosis not present

## 2024-06-17 DIAGNOSIS — I129 Hypertensive chronic kidney disease with stage 1 through stage 4 chronic kidney disease, or unspecified chronic kidney disease: Secondary | ICD-10-CM | POA: Diagnosis not present

## 2024-06-17 DIAGNOSIS — N390 Urinary tract infection, site not specified: Secondary | ICD-10-CM | POA: Diagnosis not present

## 2024-06-17 DIAGNOSIS — E1122 Type 2 diabetes mellitus with diabetic chronic kidney disease: Secondary | ICD-10-CM | POA: Diagnosis not present

## 2024-06-19 DIAGNOSIS — N1832 Chronic kidney disease, stage 3b: Secondary | ICD-10-CM | POA: Diagnosis not present

## 2024-06-19 DIAGNOSIS — J9601 Acute respiratory failure with hypoxia: Secondary | ICD-10-CM | POA: Diagnosis not present

## 2024-06-19 DIAGNOSIS — I959 Hypotension, unspecified: Secondary | ICD-10-CM | POA: Diagnosis not present

## 2024-06-19 DIAGNOSIS — J189 Pneumonia, unspecified organism: Secondary | ICD-10-CM | POA: Diagnosis not present

## 2024-06-19 DIAGNOSIS — N390 Urinary tract infection, site not specified: Secondary | ICD-10-CM | POA: Diagnosis not present

## 2024-06-19 DIAGNOSIS — I129 Hypertensive chronic kidney disease with stage 1 through stage 4 chronic kidney disease, or unspecified chronic kidney disease: Secondary | ICD-10-CM | POA: Diagnosis not present

## 2024-06-19 DIAGNOSIS — G9341 Metabolic encephalopathy: Secondary | ICD-10-CM | POA: Diagnosis not present

## 2024-06-19 DIAGNOSIS — E1122 Type 2 diabetes mellitus with diabetic chronic kidney disease: Secondary | ICD-10-CM | POA: Diagnosis not present

## 2024-06-19 DIAGNOSIS — E538 Deficiency of other specified B group vitamins: Secondary | ICD-10-CM | POA: Diagnosis not present

## 2024-06-22 NOTE — Progress Notes (Signed)
 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 Patient is currently on donepezil  10 mg daily and memantine  10 mg twice daily.  Cognitive decline is noted, needing more help with SDL's patient is able to participate on ADLs, he has HHN.  As before, his speech is slow but he is not interested in speech therapy.  Mood is fair   Follow up in   months. Continue donepezil  10 mg daily and memantine  10 mg twice daily, side effects discussed Recommend good control of her cardiovascular risk factors, follow-up with cardiology Continue to control mood as per PCP     Subjective:    This patient is accompanied in the office by his wife who supplements the history.  Previous records as well as any outside records available were reviewed prior to todays visit. Patient was last seen on 12/21/2023 with MMSE 15/30   Any changes in memory since last visit? About the same.  He was recently admitted for acute metabolic encephalopathy likely due to UTI, pneumonia and hypotension which affected his memory.  Has more difficulty remembering recent conversations or names, likes to watch westerns especially Gun Smoke and sports.  He is much less conversant than prior repeats oneself?  Denies Disoriented when walking into a room? Denies   Leaving objects?  May misplace things but not in unusual places  Wandering behavior?  denies   Any personality changes since last visit?  Denies.   Any  worsening depression?:  Denies.   Hallucinations or paranoia?  Denies.   Seizures? denies    Any sleep changes?  Sleeps a lot . He reports vivid dreams, denies nightmares, REM behavior or sleepwalking   Sleep apnea?   Denies.   Any hygiene concerns? Denies.  Independent of bathing and dressing?  HE needs some help getting dressed. He cannot choose his clothes Does the patient needs help with medications?  Wife is in charge   Who is in charge of the finances?  Wife if is in charge     Any changes in appetite?  denies, eats well, drinks plenty water     Patient have trouble swallowing? Denies.   Does the patient cook? No Any headaches?   Denies.   Any vision changes?Denies  Chronic back pain  denies   Ambulates with difficulty? Endorsed, uses a walker for stability Recent falls or head injuries? Denies.     Unilateral weakness, numbness or tingling?  No new strokelike symptoms. any tremors?  Denies    Any anosmia?  Denies   Any incontinence of urine?  Endorsed, wears diapers, had recent symptomatic UTI.  Any bowel dysfunction?   Denies      Patient lives with his wife and daughter Does the patient drive? No longer drives     History on Initial Assessment 10/12/2018: This is a 83 year old right-handed man with a history of hypertension, hyperlipidemia, diabetes, subcortical stroke in 2001 with residual left hand tingling, presenting for evaluation of memory loss. He states his memory is bad,  he started noticing changes a couple of years ago. He denies getting lost driving. He misses bills because there is no money to pay them. He manages his own medications and denies missing doses, his wife agrees. He was reporting difficulty remembering what he read in the bible or magazine after he finishes reading. He also has difficulty remembering names. His wife states that yesterday the bank called her because he was there and could not remember his social security number. She reports he got lost  last month when he made a wrong turn. She reports other bills are paid but sometimes late, he has not paid taxes and car insurance from last year, he told her he will pay when ready. His wife asked to speak privately about her concerns, she is asking for a pill to calm him down. She reports he is a time bomb in the morning, I am his worst enemy. He would sit watching TV all day. Symptoms started last summer, he started sleeping upstairs because he was getting irritated with people. Their daughter lives with them. She denies any paranoia or hallucinations. Sleep is good. He is independent with dressing and bathing.   He denies any headaches, dizziness, diplopia, dysarthria/dysphagia, neck/back pain, focal weakness, bowel/bladder dysfunction, anosmia, or tremors. He has chronic left hand tingling since his stroke. He has had nasal discharge since throat surgery in 2017. He has been on Donepezil  since 2017, no side effects. No family history of dementia. No history of significant head injuries or alcohol  use.   I personally reviewed MRI brain in 2016 showing an acute punctate nonhemorrhagic infarct within the left paramedian superior midbrain and inferomedial thalamus, multiple remote bilateral lacunar infarcts, advanced chronic microvascular disease, multiple foci of remote hemorrhage    Recent studies personally reviewed   Acute ischemic stroke   MRI of the brain March 15, 2023: 5 mm acute ischemic nonhemorrhagic infarct involving the left frontal corona radiata. 2. Subtle diffusion signal abnormality about the contralateral anterior right basal ganglia, likely an evolving late subacute ischemic infarct. 3. Underlying age-related cerebral atrophy with advanced chronic microvascular ischemic disease, with multiple remote lacunar infarcts involving the hemispheric cerebral white matter, deep gray nuclei, and pons. 4. Multiple scattered chronic micro hemorrhages clustered about the brainstem and deep gray  nuclei, most characteristic of chronic poorly controlled hypertension.     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    2D echo shows LV ejection fraction of 55 to 60% with grade 1 diastolic dysfunction.    PREVIOUS MEDICATIONS:   CURRENT MEDICATIONS:  Outpatient Encounter Medications as of 06/23/2024  Medication Sig   atorvastatin  (LIPITOR ) 80 MG tablet Take 1 tablet (80 mg total) by mouth daily.   citalopram  (CELEXA ) 20 MG tablet TAKE 1 TABLET EVERY DAY   clopidogrel  (PLAVIX ) 75 MG tablet Take 1 tablet (75 mg total) by mouth daily.   donepezil  (ARICEPT ) 10 MG tablet TAKE 1 TABLET AT BEDTIME   glipiZIDE  (GLUCOTROL  XL) 5 MG 24 hr tablet Take 1 tablet (5 mg total) by mouth daily with breakfast.   levothyroxine  (SYNTHROID ) 50 MCG tablet TAKE 1 TABLET EVERY DAY   memantine  (NAMENDA ) 10 MG tablet TAKE 1 TABLET TWICE DAILY   No facility-administered encounter medications on file as of 06/23/2024.       12/21/2023    5:00 PM 12/07/2022    6:00 PM 03/18/2022   11:35 AM  MMSE - Mini Mental State Exam  Orientation to time 1 2 1   Orientation to Place 4 3 5   Registration 2 3 3   Attention/ Calculation 2 1 3   Recall 0 1 1  Language- name 2 objects 2 2 2   Language- repeat 0 1 1  Language- follow 3 step command 3 3 3   Language- read & follow direction 1 1 1   Write a sentence 0 1 1  Copy design 0 0 0  Total score 15 18 21       10/12/2018    2:00 PM  Montreal Cognitive Assessment   Visuospatial/ Executive (0/5) 1  Naming (0/3) 1  Attention: Read list of digits (0/2) 1  Attention: Read list of letters (0/1) 0  Attention: Serial 7 subtraction starting at 100 (0/3) 0  Language: Repeat phrase (0/2) 0  Language : Fluency (0/1) 0  Abstraction (0/2) 0  Delayed Recall (0/5) 1  Orientation (0/6) 6  Total 10  Adjusted Score (based on education) 11    Objective:     PHYSICAL EXAMINATION:    VITALS:    Vitals:   06/23/24 1410  BP: (!) 107/57  Pulse: 86  Resp: 20  SpO2: 99%  Weight: 158 lb (71.7 kg)  Height: 5' 9 (1.753 m)    GEN:  The patient appears stated age and is in NAD. HEENT:  Normocephalic, atraumatic.   Neurological examination:  General: NAD, well-groomed, appears stated age. Orientation: The patient is alert. Oriented to person, no place or to date Cranial nerves: There is good facial symmetry.The speech is not fluent or clear, it is slow,mild aphasia, no dysarthria.  Fund of knowledge is reduced. Recent and remote memory are impaired. Attention and concentration are reduced. Able to name objects and unable to repeat phrases.  Hearing is intact to conversational tone.   Sensation: Sensation is intact to light touch throughout Motor: Strength is at least antigravity x4. DTR's 2/4 in UE/LE     Movement examination: Tone: There is normal tone in the UE/LE Abnormal movements:  no tremor.  No myoclonus.  No asterixis.   Coordination:  There is no decremation with RAM's. Normal finger to nose  Gait and Station: The patient has difficulty arising out of a deep-seated chair without the use of the hands. The patient's stride length is short. Uses a walker to ambulate Gait is cautious and narrow.    Thank you for allowing us  the opportunity to participate in the care of this nice patient. Please do not hesitate to contact us  for any questions or concerns.   Total time spent on today's visit was 20 minutes dedicated to this patient today, preparing to see patient, examining the patient, ordering tests and/or medications and counseling the patient, documenting clinical information in the EHR or other health record, independently interpreting results and communicating results to the patient/family, discussing treatment and goals, answering patient's questions and coordinating care.  Cc:  Jordan, Betty G, MD  Camie Sevin 06/23/2024 2:24 PM

## 2024-06-23 ENCOUNTER — Ambulatory Visit: Admitting: Physician Assistant

## 2024-06-23 ENCOUNTER — Encounter: Payer: Self-pay | Admitting: Physician Assistant

## 2024-06-23 VITALS — BP 107/57 | HR 86 | Resp 20 | Ht 69.0 in | Wt 158.0 lb

## 2024-06-23 DIAGNOSIS — F03B18 Unspecified dementia, moderate, with other behavioral disturbance: Secondary | ICD-10-CM | POA: Diagnosis not present

## 2024-06-23 NOTE — Patient Instructions (Signed)
It was a pleasure to see you today at our office.   Recommendations:  Continue donepezil 10 mg daily. Side effects were discussed  Continue Memantine 10 mg twice daily.Side effects were discussed  Continue the mood medication citalopram 20 mg daily Increase your level of activity  Follow up in 6 months     RECOMMENDATIONS FOR ALL PATIENTS WITH MEMORY PROBLEMS: 1. Continue to exercise (Recommend 30 minutes of walking everyday, or 3 hours every week) 2. Increase social interactions - continue going to [place] and enjoy social gatherings with friends and family 3. Eat healthy, avoid fried foods and eat more fruits and vegetables 4. Maintain adequate blood pressure, blood sugar, and blood cholesterol level. Reducing the risk of stroke and cardiovascular disease also helps promoting better memory. 5. Avoid stressful situations. Live a simple life and avoid aggravations. Organize your time and prepare for the next day in anticipation. 6. Sleep well, avoid any interruptions of sleep and avoid any distractions in the bedroom that may interfere with adequate sleep quality 7. Avoid sugar, avoid sweets as there is a strong link between excessive sugar intake, diabetes, and cognitive impairment We discussed the Mediterranean diet, which has been shown to help patients reduce the risk of progressive memory disorders and reduces cardiovascular risk. This includes eating fish, eat fruits and green leafy vegetables, nuts like almonds and hazelnuts, walnuts, and also use olive oil. Avoid fast foods and fried foods as much as possible. Avoid sweets and sugar as sugar use has been linked to worsening of memory function.  There is always a concern of gradual progression of memory problems. If this is the case, then we may need to adjust level of care according to patient needs. Support, both to the patient and caregiver, should then be put into place.    FALL PRECAUTIONS: Be cautious when walking. Scan the  area for obstacles that may increase the risk of trips and falls. When getting up in the mornings, sit up at the edge of the bed for a few minutes before getting out of bed. Consider elevating the bed at the head end to avoid drop of blood pressure when getting up. Walk always in a well-lit room (use night lights in the walls). Avoid area rugs or power cords from appliances in the middle of the walkways. Use a walker or a cane if necessary and consider physical therapy for balance exercise. Get your eyesight checked regularly.   HOME SAFETY: Consider the safety of the kitchen when operating appliances like stoves, microwave oven, and blender. Consider having supervision and share cooking responsibilities until no longer able to participate in those. Accidents with firearms and other hazards in the house should be identified and addressed as well.   ABILITY TO BE LEFT ALONE: If patient is unable to contact 911 operator, consider using LifeLine, or when the need is there, arrange for someone to stay with patients. Smoking is a fire hazard, consider supervision or cessation. Risk of wandering should be assessed by caregiver and if detected at any point, supervision and safe proof recommendations should be instituted       Mediterranean Diet A Mediterranean diet refers to food and lifestyle choices that are based on the traditions of countries located on the [employer]. This way of eating has been shown to help prevent certain conditions and improve outcomes for people who have chronic diseases, like kidney disease and heart disease. What are tips for following this plan? Lifestyle  Cook and eat  meals together with your family, when possible. Drink enough fluid to keep your urine clear or pale yellow. Be physically active every day. This includes: Aerobic exercise like running or swimming. Leisure activities like gardening, walking, or housework. Get 7-8 hours of sleep each night. If  recommended by your health care provider, drink red wine in moderation. This means 1 glass a day for nonpregnant women and 2 glasses a day for men. A glass of wine equals 5 oz (150 mL). Reading food labels  Check the serving size of packaged foods. For foods such as rice and pasta, the serving size refers to the amount of cooked product, not dry. Check the total fat in packaged foods. Avoid foods that have saturated fat or trans fats. Check the ingredients list for added sugars, such as corn syrup. Shopping  At the grocery store, buy most of your food from the areas near the walls of the store. This includes: Fresh fruits and vegetables (produce). Grains, beans, nuts, and seeds. Some of these may be available in unpackaged forms or large amounts (in bulk). Fresh seafood. Poultry and eggs. Low-fat dairy products. Buy whole ingredients instead of prepackaged foods. Buy fresh fruits and vegetables in-season from local farmers markets. Buy frozen fruits and vegetables in resealable bags. If you do not have access to quality fresh seafood, buy precooked frozen shrimp or canned fish, such as tuna, salmon, or sardines. Buy small amounts of raw or cooked vegetables, salads, or olives from the deli or salad bar at your store. Stock your pantry so you always have certain foods on hand, such as olive oil, canned tuna, canned tomatoes, rice, pasta, and beans. Cooking  Cook foods with extra-virgin olive oil instead of using butter or other vegetable oils. Have meat as a side dish, and have vegetables or grains as your main dish. This means having meat in small portions or adding small amounts of meat to foods like pasta or stew. Use beans or vegetables instead of meat in common dishes like chili or lasagna. Experiment with different cooking methods. Try roasting or broiling vegetables instead of steaming or sauteing them. Add frozen vegetables to soups, stews, pasta, or rice. Add nuts or seeds for added  healthy fat at each meal. You can add these to yogurt, salads, or vegetable dishes. Marinate fish or vegetables using olive oil, lemon juice, garlic, and fresh herbs. Meal planning  Plan to eat 1 vegetarian meal one day each week. Try to work up to 2 vegetarian meals, if possible. Eat seafood 2 or more times a week. Have healthy snacks readily available, such as: Vegetable sticks with hummus. Greek yogurt. Fruit and nut trail mix. Eat balanced meals throughout the week. This includes: Fruit: 2-3 servings a day Vegetables: 4-5 servings a day Low-fat dairy: 2 servings a day Fish, poultry, or lean meat: 1 serving a day Beans and legumes: 2 or more servings a week Nuts and seeds: 1-2 servings a day Whole grains: 6-8 servings a day Extra-virgin olive oil: 3-4 servings a day Limit red meat and sweets to only a few servings a month What are my food choices? Mediterranean diet Recommended Grains: Whole-grain pasta. Brown rice. Bulgar wheat. Polenta. Couscous. Whole-wheat bread. Orpah Cobb. Vegetables: Artichokes. Beets. Broccoli. Cabbage. Carrots. Eggplant. Green beans. Chard. Kale. Spinach. Onions. Leeks. Peas. Squash. Tomatoes. Peppers. Radishes. Fruits: Apples. Apricots. Avocado. Berries. Bananas. Cherries. Dates. Figs. Grapes. Lemons. Melon. Oranges. Peaches. Plums. Pomegranate. Meats and other protein foods: Beans. Almonds. Sunflower seeds. Pine nuts. Peanuts.  [place]. [place]. Scallops. Shrimp. Tuna. Tilapia. Clams. Oysters. Eggs. Dairy: Low-fat milk. Cheese. Greek yogurt. Beverages: Water. Red wine. Herbal tea. Fats and oils: Extra virgin olive oil. Avocado oil. Grape seed oil. Sweets and desserts: [place] yogurt with honey. Baked apples. Poached pears. Trail mix. Seasoning and other foods: Basil. Cilantro. Coriander. Cumin. Mint. Parsley. Sage. Rosemary. Tarragon. Garlic. Oregano. Thyme. Pepper. Balsalmic vinegar. Tahini. Hummus. Tomato sauce. Olives. Mushrooms. Limit these Grains:  Prepackaged pasta or rice dishes. Prepackaged cereal with added sugar. Vegetables: Deep fried potatoes (french fries). Fruits: Fruit canned in syrup. Meats and other protein foods: Beef. Pork. Lamb. Poultry with skin. Hot dogs. Tomasa Blase. Dairy: Ice cream. Sour cream. Whole milk. Beverages: Juice. Sugar-sweetened soft drinks. Beer. Liquor and spirits. Fats and oils: Butter. Canola oil. Vegetable oil. Beef fat (tallow). Lard. Sweets and desserts: Cookies. Cakes. Pies. Candy. Seasoning and other foods: Mayonnaise. Premade sauces and marinades. The items listed may not be a complete list. Talk with your dietitian about what dietary choices are right for you. Summary The Mediterranean diet includes both food and lifestyle choices. Eat a variety of fresh fruits and vegetables, beans, nuts, seeds, and whole grains. Limit the amount of red meat and sweets that you eat. Talk with your health care provider about whether it is safe for you to drink red wine in moderation. This means 1 glass a day for nonpregnant women and 2 glasses a day for men. A glass of wine equals 5 oz (150 mL). This information is not intended to replace advice given to you by your health care provider. Make sure you discuss any questions you have with your health care provider. Document Released: 03/19/2016 Document Revised: 04/21/2016 Document Reviewed: 03/19/2016 Elsevier Interactive Patient Education  2017 [employer].

## 2024-06-27 DIAGNOSIS — E538 Deficiency of other specified B group vitamins: Secondary | ICD-10-CM | POA: Diagnosis not present

## 2024-06-27 DIAGNOSIS — G9341 Metabolic encephalopathy: Secondary | ICD-10-CM | POA: Diagnosis not present

## 2024-06-27 DIAGNOSIS — N390 Urinary tract infection, site not specified: Secondary | ICD-10-CM | POA: Diagnosis not present

## 2024-06-27 DIAGNOSIS — I129 Hypertensive chronic kidney disease with stage 1 through stage 4 chronic kidney disease, or unspecified chronic kidney disease: Secondary | ICD-10-CM | POA: Diagnosis not present

## 2024-06-27 DIAGNOSIS — E1122 Type 2 diabetes mellitus with diabetic chronic kidney disease: Secondary | ICD-10-CM | POA: Diagnosis not present

## 2024-06-27 DIAGNOSIS — J189 Pneumonia, unspecified organism: Secondary | ICD-10-CM | POA: Diagnosis not present

## 2024-06-27 DIAGNOSIS — N1832 Chronic kidney disease, stage 3b: Secondary | ICD-10-CM | POA: Diagnosis not present

## 2024-06-27 DIAGNOSIS — I959 Hypotension, unspecified: Secondary | ICD-10-CM | POA: Diagnosis not present

## 2024-06-27 DIAGNOSIS — J9601 Acute respiratory failure with hypoxia: Secondary | ICD-10-CM | POA: Diagnosis not present

## 2024-06-29 DIAGNOSIS — E1142 Type 2 diabetes mellitus with diabetic polyneuropathy: Secondary | ICD-10-CM | POA: Diagnosis not present

## 2024-06-29 DIAGNOSIS — M79674 Pain in right toe(s): Secondary | ICD-10-CM | POA: Diagnosis not present

## 2024-06-29 DIAGNOSIS — L84 Corns and callosities: Secondary | ICD-10-CM | POA: Diagnosis not present

## 2024-06-29 DIAGNOSIS — M79675 Pain in left toe(s): Secondary | ICD-10-CM | POA: Diagnosis not present

## 2024-06-29 DIAGNOSIS — B351 Tinea unguium: Secondary | ICD-10-CM | POA: Diagnosis not present

## 2024-06-30 ENCOUNTER — Ambulatory Visit: Payer: Self-pay

## 2024-06-30 ENCOUNTER — Ambulatory Visit: Admitting: Family Medicine

## 2024-06-30 NOTE — Telephone Encounter (Signed)
 FYI Only or Action Required?: FYI only for provider: urgent care advised .  Patient was last seen in primary care on 06/09/2024 by Johnny Garnette LABOR, MD.  Called Nurse Triage reporting Bleeding/Bruising.  Symptoms began today.  Interventions attempted: Nothing.  Symptoms are: gradually worsening.  Triage Disposition: See HCP Within 4 Hours (Or PCP Triage)  Patient/caregiver understands and will follow disposition?: Yes   Copied from CRM [id number]. Topic: Clinical - Red Word Triage >> Jun 30, 2024  1:30 PM Alfonso ORN wrote: Red Word that prompted transfer to Nurse Triage: pt son said pt has bruising on back one them has blood in it. Reason for Disposition  [1] Raised bruise AND [2] size > 2 inches (5 cm) and getting bigger  Answer Assessment - Initial Assessment Questions Additional info: No appointments are available today, patient proceeding to urgent care.    1. APPEARANCE of BRUISE: Describe the bruise.      Swollen bruise on back looks blood filled  2. SIZE: How large is the bruise?      Size of half dollar coin 3. NUMBER: How many bruises are there?      Multiple smaller bruises 4. LOCATION: Where is the bruise located?      Back  5. ONSET: How long ago did the bruise occur?      Noted today-non traumatic  6. CAUSE: What do you think caused the bruise?     Non traumatic  7. MEDICAL HISTORY: Do you have any medical problems that can cause easy bruising or bleeding? (e.g., leukemia, liver disease, recent chemotherapy)     On Plavix   8. MEDICINES: Do you take any medicines which thin the blood such as: aspirin , apixaban, heparin, ibuprofen (NSAIDS), Plavix , or Coumadin?     Plavix   9. OTHER SYMPTOMS: Do you have any other symptoms?  (e.g., weakness, dizziness, pain, fever, nosebleed, blood in urine/stool)     Tender to touch bruising 10. PREGNANCY: Is there any chance you are pregnant? When was your last menstrual period?  Protocols used:  [employer]

## 2024-07-05 NOTE — Progress Notes (Signed)
   07/05/2024  Patient ID: Scott Vasquez, male   DOB: Oct 23, 1940, 83 y.o.   MRN: [id number]  Pharmacy Quality Measure Review  This patient is appearing on a report for being at risk of failing the adherence measure for cholesterol (statin) medications this calendar year.   Medication: Atorvastatin  Last fill date: 06/24/24 for 90 day supply  Insurance report was not up to date. No action needed at this time.   Jon VEAR Lindau, PharmD Clinical Pharmacist [phone]

## 2024-07-11 ENCOUNTER — Telehealth: Payer: Self-pay | Admitting: Family Medicine

## 2024-07-11 NOTE — Telephone Encounter (Signed)
 FMLA to be filled out-placed in provider's folder.  Please fax upon completion to:  [facility]- [phone]  *Call patient's wife upon paperwork being faxed- [phone]

## 2024-07-11 NOTE — Telephone Encounter (Signed)
 Mr Gouger does not work and I believe he lives with his wife.  What is the reason for the FMLA? Thanks, BJ

## 2024-07-11 NOTE — Telephone Encounter (Signed)
 Placed on Dr Gib desk

## 2024-07-11 NOTE — Telephone Encounter (Signed)
 Spoke to the wife and she states that the FMLA is for her daughter.  I attempted to call the daughter, but the mailbox is not set up yet.  [phone].

## 2024-07-12 DIAGNOSIS — J189 Pneumonia, unspecified organism: Secondary | ICD-10-CM | POA: Diagnosis not present

## 2024-07-12 DIAGNOSIS — J9601 Acute respiratory failure with hypoxia: Secondary | ICD-10-CM | POA: Diagnosis not present

## 2024-07-12 DIAGNOSIS — E538 Deficiency of other specified B group vitamins: Secondary | ICD-10-CM | POA: Diagnosis not present

## 2024-07-12 DIAGNOSIS — N1832 Chronic kidney disease, stage 3b: Secondary | ICD-10-CM | POA: Diagnosis not present

## 2024-07-12 DIAGNOSIS — E1122 Type 2 diabetes mellitus with diabetic chronic kidney disease: Secondary | ICD-10-CM | POA: Diagnosis not present

## 2024-07-12 DIAGNOSIS — G9341 Metabolic encephalopathy: Secondary | ICD-10-CM | POA: Diagnosis not present

## 2024-07-12 DIAGNOSIS — I959 Hypotension, unspecified: Secondary | ICD-10-CM | POA: Diagnosis not present

## 2024-07-12 DIAGNOSIS — N390 Urinary tract infection, site not specified: Secondary | ICD-10-CM | POA: Diagnosis not present

## 2024-07-12 DIAGNOSIS — I129 Hypertensive chronic kidney disease with stage 1 through stage 4 chronic kidney disease, or unspecified chronic kidney disease: Secondary | ICD-10-CM | POA: Diagnosis not present

## 2024-07-13 NOTE — Telephone Encounter (Signed)
Left message on machine for daughter to return our call 

## 2024-07-18 ENCOUNTER — Telehealth: Payer: Self-pay | Admitting: *Deleted

## 2024-07-18 DIAGNOSIS — Z0279 Encounter for issue of other medical certificate: Secondary | ICD-10-CM

## 2024-07-18 NOTE — Telephone Encounter (Signed)
 I understand the situation, Mr Brock have several chronic conditions and needs some assistance but I cannot fill FMLA for everything he needs. I will be glad to complete forms to provide a few hours per month to cover for assistance with appointments and treatments.  Thanks, BJ

## 2024-07-18 NOTE — Telephone Encounter (Signed)
 Called the patients daughter and the patients daughter stated she needs the FLMA for anything her dad needs rather that's to take him to and from appointments, if he's having a bad day and he needs her to help him because her mom is not able to help her. She stated the FLMA will be able to help her keep her job.

## 2024-07-18 NOTE — Telephone Encounter (Signed)
 Copied from CRM [id number]. Topic: General - Call Back - No Documentation >> Jul 18, 2024  2:40 PM Roselie BROCKS wrote: Reason for CRM: Patient requests a call back

## 2024-07-18 NOTE — Telephone Encounter (Signed)
 The call was made in error, patient informed.

## 2024-07-19 NOTE — Telephone Encounter (Signed)
 Patients wife has been informed that the [facility] paperwork has been filled out and faxed.

## 2024-07-22 ENCOUNTER — Other Ambulatory Visit: Payer: Self-pay | Admitting: Adult Health

## 2024-07-22 DIAGNOSIS — E1165 Type 2 diabetes mellitus with hyperglycemia: Secondary | ICD-10-CM

## 2024-08-07 ENCOUNTER — Other Ambulatory Visit: Payer: Self-pay

## 2024-08-15 ENCOUNTER — Ambulatory Visit: Payer: Self-pay

## 2024-08-15 NOTE — Telephone Encounter (Signed)
 FYI Only or Action Required?: FYI only for provider: appointment scheduled on 08/16/24.  Patient was last seen in primary care on 06/09/2024 by Johnny Garnette LABOR, MD.  Called Nurse Triage reporting Penile Discharge.  Symptoms began today.  Interventions attempted: Nothing.  Symptoms are: stable.  Triage Disposition: See Physician Within 24 Hours  Patient/caregiver understands and will follow disposition?:    Copied from CRM #[id number]. Topic: Clinical - Red Word Triage >> Aug 15, 2024  1:35 PM Eva FALCON wrote: Red Word that prompted transfer to Nurse Triage: yellow/green discharge from penis. pt has dementia and spouse says its hard for him to speak up. Reason for Disposition  Pus (white, yellow) or bloody discharge from end of penis  Answer Assessment - Initial Assessment Questions 1. SYMPTOM: What's the main symptom you're concerned about? (e.g., blood in semen, discharge or pus from penis, itching, pain, rash, swelling)     Wife noticed today green discharge from penis today  4. PAIN: Is there any pain? If Yes, ask: How bad is it?  (Scale 1-10; or mild, moderate, severe)     Pt expresses no pain 5. URINE: Any difficulty passing urine? If Yes, ask: When was the last time?     no 6. CAUSE: What do you think is causing the symptoms?      7. OTHER SYMPTOMS: Do you have any other symptoms? (e.g., blood in urine, abdomen pain, fever)     Wife denies fever or other symptoms.  Protocols used: Penis and Scrotum Symptoms-A-AH

## 2024-08-16 ENCOUNTER — Encounter: Payer: Self-pay | Admitting: Family Medicine

## 2024-08-16 ENCOUNTER — Ambulatory Visit: Admitting: Family Medicine

## 2024-08-16 VITALS — BP 128/68 | HR 79 | Temp 97.7°F | Resp 16 | Ht 69.0 in | Wt 144.8 lb

## 2024-08-16 DIAGNOSIS — E039 Hypothyroidism, unspecified: Secondary | ICD-10-CM

## 2024-08-16 DIAGNOSIS — Z7984 Long term (current) use of oral hypoglycemic drugs: Secondary | ICD-10-CM | POA: Diagnosis not present

## 2024-08-16 DIAGNOSIS — E1159 Type 2 diabetes mellitus with other circulatory complications: Secondary | ICD-10-CM | POA: Diagnosis not present

## 2024-08-16 DIAGNOSIS — R369 Urethral discharge, unspecified: Secondary | ICD-10-CM

## 2024-08-16 DIAGNOSIS — F01C4 Vascular dementia, severe, with anxiety: Secondary | ICD-10-CM | POA: Insufficient documentation

## 2024-08-16 DIAGNOSIS — N1832 Chronic kidney disease, stage 3b: Secondary | ICD-10-CM | POA: Diagnosis not present

## 2024-08-16 DIAGNOSIS — F03B18 Unspecified dementia, moderate, with other behavioral disturbance: Secondary | ICD-10-CM

## 2024-08-16 DIAGNOSIS — E890 Postprocedural hypothyroidism: Secondary | ICD-10-CM

## 2024-08-16 DIAGNOSIS — R350 Frequency of micturition: Secondary | ICD-10-CM | POA: Diagnosis not present

## 2024-08-16 LAB — POCT GLYCOSYLATED HEMOGLOBIN (HGB A1C): Hemoglobin A1C: 6.9 % — AB (ref 4.0–5.6)

## 2024-08-16 LAB — BASIC METABOLIC PANEL WITH GFR
BUN: 31 mg/dL — ABNORMAL HIGH (ref 6–23)
CO2: 32 meq/L (ref 19–32)
Calcium: 9.4 mg/dL (ref 8.4–10.5)
Chloride: 103 meq/L (ref 96–112)
Creatinine, Ser: 1.28 mg/dL (ref 0.40–1.50)
GFR: 51.12 mL/min — ABNORMAL LOW
Glucose, Bld: 127 mg/dL — ABNORMAL HIGH (ref 70–99)
Potassium: 3.7 meq/L (ref 3.5–5.1)
Sodium: 142 meq/L (ref 135–145)

## 2024-08-16 LAB — TSH: TSH: 5.58 u[IU]/mL — ABNORMAL HIGH (ref 0.35–5.50)

## 2024-08-16 LAB — T4, FREE: Free T4: 1.04 ng/dL (ref 0.60–1.60)

## 2024-08-16 LAB — VITAMIN D 25 HYDROXY (VIT D DEFICIENCY, FRACTURES): VITD: 10.06 ng/mL — ABNORMAL LOW (ref 30.00–100.00)

## 2024-08-16 NOTE — Progress Notes (Signed)
 "   Chief Complaint  Patient presents with   Urinary Frequency    Discharge in urine and urine frequency     Discussed the use of AI scribe software for clinical note transcription with the patient, who gave verbal consent to proceed.  History of Present Illness Scott Vasquez is an 84 year old male with DM 2, hypertension, hyperlipidemia, CVA, and dementia here today with his sone for follow up and concerned about urinary frequency and urethral discharge.  He has a new onset of clear penile discharge mixed with urine, first noticed yesterday by her daughter. There is no associated pain, lesions, or risk factors for sexually transmitted diseases. He has not c/o abdominal pain and has not had gross hematuria or decreased urine out put. He has a history of frequent urination, which is a chronic issue. He has seen a urologist regularly, with the last visit approximately six months ago.  No MS changes. He is experiencing increased fatigue, sleeping more than usual, and decreased energy levels. He is not talking as much as he used to. He sees a neurologist once or twice a year and has an upcoming appointment scheduled. Currently he is on Aricept  10 mg daily and Namenda  10 mg bid.  He is also on citalopram  20 mg for anxiety, prescribed by neurology. His son has some questions about this medication and indication.  Hypothyroidism on Levothyroxine  50 mcg daily. S/P left-sided thyroidectomy. Pathology was negative for atypia or malignancy, 04/2015. Benign inflamed soft tissue with cystic changes. Lab Results  Component Value Date   TSH 5.03 07/15/2023   HTN and CKD PPP:Wzhjupcz for foam in urine. He has not c/o CP and has not had SOB or edema. He is on non pharmacologic treatment. Lab Results  Component Value Date   NA 138 05/26/2024   CL 99 05/26/2024   K 4.4 05/26/2024   CO2 25 05/26/2024   BUN 20 05/26/2024   CREATININE 1.44 (H) 05/26/2024   EGFR 48 (L) 05/26/2024   CALCIUM  9.5  05/26/2024   PHOS 2.3 (L) 05/13/2024   ALBUMIN 2.7 (L) 05/13/2024   GLUCOSE 76 05/26/2024   Diabetes Mellitus II: Dx'ed 15+ years ago. 2007 HgA1C 8.8  Currently on Glipizide  XL 5 mg daily, decreased a few months ago from 10 mg. BS's low 100's. Negative for symptoms of hypoglycemia, polyuria, polydipsia, numbness extremities, foot ulcers/trauma  Lab Results  Component Value Date   HGBA1C 5.4 05/12/2024   Lab Results  Component Value Date   MICROALBUR 1.2 02/23/2024   Review of Systems  Constitutional:  Negative for appetite change, chills and fever.  HENT:  Negative for sore throat and trouble swallowing.   Respiratory:  Negative for cough and wheezing.   Gastrointestinal:  Negative for abdominal pain, nausea and vomiting.  Endocrine: Negative for cold intolerance and heat intolerance.  Genitourinary:  Negative for flank pain and testicular pain.  Skin:  Negative for rash.  Neurological:  Negative for syncope and facial asymmetry.  Psychiatric/Behavioral:  Negative for hallucinations.   See other pertinent positives and negatives in HPI.  Medications Ordered Prior to Encounter[1]  Past Medical History:  Diagnosis Date   Anemia    iron deficiency   Diabetes mellitus    type II   Diverticulosis of colon    Hypertension    Stroke (HCC)    numbness in left hand   Thyroid  nodule    Allergies[2]  Social History   Socioeconomic History   Marital status: Married  Spouse name: Not on file   Number of children: 2   Years of education: 8   Highest education level: Not on file  Occupational History   Occupation: retired    Associate Professor: [email or web address]  Tobacco Use   Smoking status: Former    Current packs/day: 0.00    Average packs/day: 0.5 packs/day for 20.0 years (10.0 ttl pk-yrs)    Types: Cigarettes    Start date: 27    Quit date: 08/10/1958    Years since quitting: 66.0   Smokeless tobacco: Never  Vaping Use   Vaping status: Never Used  Substance and Sexual Activity    Alcohol  use: No   Drug use: No   Sexual activity: Not on file  Other Topics Concern   Not on file  Social History Narrative   Pt is R handed   Lives in single story home with his wife, Scott Vasquez   2 adult children   8th grade education   Retired insurance account manager from [employer]   Married 56 years   Social Drivers of Health   Tobacco Use: Medium Risk (08/16/2024)   Patient History    Smoking Tobacco Use: Former    Smokeless Tobacco Use: Never    Passive Exposure: Not on Actuary Strain: Not on file  Food Insecurity: No Food Insecurity (05/17/2024)   Epic    Worried About Programme Researcher, Broadcasting/film/video in the Last Year: Never true    Ran Out of Food in the Last Year: Never true  Transportation Needs: No Transportation Needs (05/17/2024)   Epic    Lack of Transportation (Medical): No    Lack of Transportation (Non-Medical): No  Physical Activity: Not on file  Stress: Not on file  Social Connections: Moderately Isolated (05/12/2024)   Social Connection and Isolation Panel    Frequency of Communication with Friends and Family: More than three times a week    Frequency of Social Gatherings with Friends and Family: More than three times a week    Attends Religious Services: Never    Database Administrator or Organizations: No    Attends Banker Meetings: Never    Marital Status: Married  Depression (PHQ2-9): Low Risk (07/15/2023)   Depression (PHQ2-9)    PHQ-2 Score: 0  Alcohol  Screen: Not on file  Housing: Unknown (05/17/2024)   Epic    Unable to Pay for Housing in the Last Year: No    Number of Times Moved in the Last Year: Not on file    Homeless in the Last Year: No  Utilities: Not At Risk (05/17/2024)   Epic    Threatened with loss of utilities: No  Health Literacy: Not on file   Today's Vitals   08/16/24 1107  BP: 128/68  Resp: 16  Temp: 97.7 F (36.5 C)  Weight: 144 lb 12.8 oz (65.7 kg)  Height: 5' 9 (1.753 m)   Body mass index is 21.38  kg/m.  Physical Exam Vitals and nursing note reviewed.  Constitutional:      General: He is not in acute distress.    Appearance: He is well-developed.  HENT:     Head: Normocephalic and atraumatic.     Mouth/Throat:     Mouth: Mucous membranes are moist.  Eyes:     Conjunctiva/sclera: Conjunctivae normal.  Cardiovascular:     Rate and Rhythm: Normal rate and regular rhythm.     Heart sounds: No murmur heard. Pulmonary:     Effort: Pulmonary  effort is normal. No respiratory distress.     Breath sounds: Normal breath sounds.  Abdominal:     Palpations: Abdomen is soft. There is no mass.     Tenderness: There is no abdominal tenderness.  Genitourinary:    Penis: No erythema, tenderness, discharge, swelling or lesions.   Skin:    General: Skin is warm.     Findings: No erythema or rash.  Neurological:     Mental Status: He is alert. Mental status is at baseline.     Comments: Unstable gait assisted with a walker.  Psychiatric:        Mood and Affect: Mood and affect normal.        Speech: He is noncommunicative.    ASSESSMENT AND PLAN:  Mr. Luan Espindola was seen today for urinary frequency and follow-up.  Diagnoses and all orders for this visit:  Orders Placed This Encounter  Procedures   Urine Culture   Urinalysis with Culture Reflex   Basic metabolic panel with GFR   T4, free   TSH   VITAMIN D  25 Hydroxy (Vit-D Deficiency, Fractures)   REFLEXIVE URINE CULTURE   POC HgB A1c   Lab Results  Component Value Date   HGBA1C 6.9 (A) 08/16/2024   Lab Results  Component Value Date   NA 142 08/16/2024   CL 103 08/16/2024   K 3.7 08/16/2024   CO2 32 08/16/2024   BUN 31 (H) 08/16/2024   CREATININE 1.28 08/16/2024   GFR 51.12 (L) 08/16/2024   CALCIUM  9.4 08/16/2024   PHOS 2.3 (L) 05/13/2024   ALBUMIN 2.7 (L) 05/13/2024   GLUCOSE 127 (H) 08/16/2024   Lab Results  Component Value Date   TSH 5.58 (H) 08/16/2024   Lab Results  Component Value Date   VD25OH  10.06 (L) 08/16/2024   Type 2 diabetes mellitus with other circulatory complication, without long-term current use of insulin  (HCC) Assessment & Plan: A1C went up from 5.4 to 6.9. Glipizide  XL 5 mg daily to continue. Side effects discussed, caution with skipping meals. Continue periodic eye exam and good footcare. Follow-up in 5 months.  Orders: -     POCT glycosylated hemoglobin (Hb A1C)  Urethral discharge We discussed possible causes. Hx and examination today do not suggest a serious process. No risk factors for STD's. Monitor for new symptoms. If is still a concerned , he can be seen by his urologist.  -     Urinalysis w microscopic + reflex cultur -     Urine Culture -     REFLEXIVE URINE CULTURE  Urinary frequency Problem seems to be chronic. Ucx in 05/2024 negative. Further recommendations according to UA and Ucx results. Instructed about warning signs.  -     Urinalysis w microscopic + reflex cultur -     Urine Culture -     REFLEXIVE URINE CULTURE  Acquired hypothyroidism Assessment & Plan: Problem has been stable. S/P partial thyroidectomy, left lobectomy 04/29/15. Last TSH 5.0 in 07/2023. Continue Levothyroxine  50 mcg daily, which according to son he takes as instructed. Last refill 07/2023. Further recommendations according to TSH result  Orders: -     T4, free; Future -     TSH; Future  Stage 3b chronic kidney disease (HCC) Assessment & Plan: 05/2024 Cr 1.4 and  e GFR 48. Continue adequate hydration, low salt diet and avoidance of NSAID's. He is not on RAS agent or SGLT-2 inh.  Orders: -     Basic metabolic panel  with GFR; Future -     VITAMIN D  25 Hydroxy (Vit-D Deficiency, Fractures); Future  Moderate dementia with behavioral disturbance (HCC) Assessment & Plan: Currently on Aricept  10 mg daily and Namenda  10 mg bid. He is also on Celexa  20 mg to treat anxiety. His son does not think Mr Scale has anxiety and not sure for how long he has been on  this medication. He has tolerated meds well. Follows with neurologist.   Return in about 6 months (around 02/08/2025) for chronic problems.  Everly Rubalcava, MD [facility]. [place] office.      [1]  Current Outpatient Medications on File Prior to Visit  Medication Sig Dispense Refill   atorvastatin  (LIPITOR ) 80 MG tablet Take 1 tablet (80 mg total) by mouth daily. 90 tablet 3   citalopram  (CELEXA ) 20 MG tablet TAKE 1 TABLET EVERY DAY 90 tablet 3   clopidogrel  (PLAVIX ) 75 MG tablet Take 1 tablet (75 mg total) by mouth daily. 90 tablet 2   donepezil  (ARICEPT ) 10 MG tablet TAKE 1 TABLET AT BEDTIME 90 tablet 3   glipiZIDE  (GLUCOTROL  XL) 5 MG 24 hr tablet Take 1 tablet (5 mg total) by mouth daily with breakfast.     levothyroxine  (SYNTHROID ) 50 MCG tablet TAKE 1 TABLET EVERY DAY 90 tablet 3   memantine  (NAMENDA ) 10 MG tablet TAKE 1 TABLET TWICE DAILY 180 tablet 3   No current facility-administered medications on file prior to visit.  [2]  Allergies Allergen Reactions   Actos  [Pioglitazone ] Other (See Comments)    Diarrhea    Metformin  And Related Other (See Comments)    Diarrhea     "

## 2024-08-16 NOTE — Patient Instructions (Addendum)
 A few things to remember from today's visit:  Type 2 diabetes mellitus with other circulatory complication, without long-term current use of insulin  (HCC) - Plan: POC HgB A1c  Urethral discharge - Plan: Urinalysis with Culture Reflex  Urinary frequency - Plan: Urinalysis with Culture Reflex  Acquired hypothyroidism - Plan: T4, free, TSH  Stage 3b chronic kidney disease (HCC) - Plan: Basic metabolic panel with GFR, VITAMIN D  25 Hydroxy (Vit-D Deficiency, Fractures)  Monitor for new symptoms. No changes today.  If you need refills for medications you take chronically, please call your pharmacy. Do not use My Chart to request refills or for acute issues that need immediate attention. If you send a my chart message, it may take a few days to be addressed, specially if I am not in the office.  Please be sure medication list is accurate. If a new problem present, please set up appointment sooner than planned today.

## 2024-08-17 ENCOUNTER — Ambulatory Visit: Payer: Self-pay | Admitting: Family Medicine

## 2024-08-17 DIAGNOSIS — N390 Urinary tract infection, site not specified: Secondary | ICD-10-CM

## 2024-08-17 DIAGNOSIS — E559 Vitamin D deficiency, unspecified: Secondary | ICD-10-CM | POA: Insufficient documentation

## 2024-08-17 MED ORDER — LEVOTHYROXINE SODIUM 50 MCG PO TABS: 50.0000 ug | ORAL_TABLET | Freq: Every day | ORAL | 1 refills | Status: AC

## 2024-08-17 MED ORDER — VITAMIN D (ERGOCALCIFEROL) 1.25 MG (50000 UNIT) PO CAPS: 50000.0000 [IU] | ORAL_CAPSULE | ORAL | 0 refills | Status: AC

## 2024-08-17 NOTE — Assessment & Plan Note (Addendum)
 Currently on Aricept  10 mg daily and Namenda  10 mg bid. He is also on Celexa  20 mg to treat anxiety. His son does not think Mr Scale has anxiety and not sure for how long he has been on this medication. He has tolerated meds well. Follows with neurologist.

## 2024-08-17 NOTE — Assessment & Plan Note (Signed)
 05/2024 Cr 1.4 and  e GFR 48. Continue adequate hydration, low salt diet and avoidance of NSAID's. He is not on RAS agent or SGLT-2 inh.

## 2024-08-17 NOTE — Assessment & Plan Note (Addendum)
 Problem has been stable. S/P partial thyroidectomy, left lobectomy 04/29/15. Last TSH 5.0 in 07/2023. Continue Levothyroxine  50 mcg daily, which according to son he takes as instructed. Last refill 07/2023. Further recommendations according to TSH result.

## 2024-08-17 NOTE — Assessment & Plan Note (Signed)
 A1C went up from 5.4 to 6.9. Glipizide  XR 5 mg daily to continue. Side effects discussed, caution with skipping meals. Continue periodic eye exam and good footcare. Follow-up in 5 months.

## 2024-08-18 LAB — URINALYSIS W MICROSCOPIC + REFLEX CULTURE
Bilirubin Urine: NEGATIVE
Glucose, UA: NEGATIVE
Nitrites, Initial: NEGATIVE
RBC / HPF: NONE SEEN /HPF (ref 0–2)
Specific Gravity, Urine: 1.023 (ref 1.001–1.035)
WBC, UA: >=60 /HPF — AB (ref 0–5)
pH: <=5 — AB (ref 5.0–8.0)

## 2024-08-18 LAB — URINE CULTURE
MICRO NUMBER:: 17442879
SPECIMEN QUALITY:: ADEQUATE

## 2024-08-18 LAB — CULTURE INDICATED

## 2024-08-18 MED ORDER — SULFAMETHOXAZOLE-TRIMETHOPRIM 800-160 MG PO TABS: 1.0000 | ORAL_TABLET | Freq: Two times a day (BID) | ORAL | 0 refills | Status: AC

## 2024-08-18 NOTE — Telephone Encounter (Signed)
 Patients wife was informed and voiced understanding but she wanted me to call her daughter and let her know at [phone] - I was unable to reach her daughter so I called their son and informed him and voiced understanding.

## 2024-08-18 NOTE — Telephone Encounter (Signed)
 Patients wife and daughter has been informed of his lab results. They did not have any questions or concerns at this time.

## 2024-08-18 NOTE — Addendum Note (Signed)
 Addended by: Kord Monette G on: 08/18/2024 01:31 PM   Modules accepted: Orders

## 2024-08-21 NOTE — Telephone Encounter (Signed)
 Patient informed and voiced understanding

## 2024-08-25 ENCOUNTER — Ambulatory Visit: Admitting: Family Medicine

## 2024-09-15 ENCOUNTER — Ambulatory Visit: Payer: Self-pay

## 2024-09-15 NOTE — Telephone Encounter (Signed)
 FYI Only or Action Required?: Action required by provider: clinical question for provider.  Patient was last seen in primary care on 08/16/2024 by Scott Vasquez, Scott G, MD.  Called Nurse Triage reporting Foot Swelling.  Symptoms began today.  Interventions attempted: Rest, hydration, or home remedies.  Symptoms are: unchanged.  Triage Disposition: See HCP Within 4 Hours (Or PCP Triage)  Patient/caregiver understands and will follow disposition?: Yes   Reason for Disposition  [1] Thigh, calf, or ankle swelling AND [2] only 1 side  Answer Assessment - Initial Assessment Questions Spoke with patient's wife, [name], who states that patient woke up this morning with mild swelling in his left foot that is painful to touch. She is calling in to inquire if this is related to kidney disease and states that this is a new symptom. Denies any SOB, chest pain. Office visit advised for today-unable to bring patient in at available appt times. UC advised-states that she will look into UC locations near home.  Advised to elevate extremity. Patient's wife is looking for advice from PCP on what to do for this swelling.  1. ONSET: When did the swelling start? (e.Vasquez., minutes, hours, days)     Woke up with swelling this morning  2. LOCATION: What part of the leg is swollen?  Are both legs swollen or just one leg?     Left foot  3. SEVERITY: How bad is the swelling? (e.Vasquez., localized; mild, moderate, severe)     Mild  4. REDNESS: Is there redness or signs of infection?     No  5. PAIN: Is the swelling painful to touch? If Yes, ask: How painful is it?   (Scale 1-10; mild, moderate or severe)     Yes, mild pain  6. FEVER: Do you have a fever? If Yes, ask: What is it, how was it measured, and when did it start?      No  7. CAUSE: What do you think is causing the leg swelling?     Not sure, possibly from CKD  8. MEDICAL HISTORY: Do you have a history of blood clots (e.Vasquez., DVT), cancer,  heart failure, kidney disease, or liver failure?     Kidney disease  9. RECURRENT SYMPTOM: Have you had leg swelling before? If Yes, ask: When was the last time? What happened that time?     No, new symptom  10. OTHER SYMPTOMS: Do you have any other symptoms? (e.Vasquez., chest pain, difficulty breathing)       Denies any other symptoms at this time  11. PREGNANCY: Is there any chance you are pregnant? When was your last menstrual period?       NA  Protocols used: Leg Swelling and Edema-A-AH  Reason for Triage: Patients wife [name] calling stating patients left foot is swollen, when went to bed was normal

## 2024-12-21 ENCOUNTER — Ambulatory Visit: Admitting: Physician Assistant

## 2025-02-13 ENCOUNTER — Ambulatory Visit: Admitting: Family Medicine
